# Patient Record
Sex: Female | Born: 1950
Health system: Southern US, Community
[De-identification: ages and names within clinical notes are randomized; demographics above are authoritative.]

## PROBLEM LIST (undated history)

## (undated) DIAGNOSIS — I209 Angina pectoris, unspecified: Secondary | ICD-10-CM

## (undated) DIAGNOSIS — K449 Diaphragmatic hernia without obstruction or gangrene: Secondary | ICD-10-CM

## (undated) DIAGNOSIS — Z95810 Presence of automatic (implantable) cardiac defibrillator: Secondary | ICD-10-CM

## (undated) DIAGNOSIS — G459 Transient cerebral ischemic attack, unspecified: Secondary | ICD-10-CM

## (undated) DIAGNOSIS — F419 Anxiety disorder, unspecified: Secondary | ICD-10-CM

## (undated) DIAGNOSIS — N879 Dysplasia of cervix uteri, unspecified: Secondary | ICD-10-CM

## (undated) DIAGNOSIS — Z8711 Personal history of peptic ulcer disease: Secondary | ICD-10-CM

## (undated) DIAGNOSIS — N189 Chronic kidney disease, unspecified: Secondary | ICD-10-CM

## (undated) DIAGNOSIS — E119 Type 2 diabetes mellitus without complications: Secondary | ICD-10-CM

## (undated) DIAGNOSIS — I25709 Atherosclerosis of coronary artery bypass graft(s), unspecified, with unspecified angina pectoris: Secondary | ICD-10-CM

## (undated) DIAGNOSIS — J45909 Unspecified asthma, uncomplicated: Secondary | ICD-10-CM

## (undated) DIAGNOSIS — I219 Acute myocardial infarction, unspecified: Secondary | ICD-10-CM

## (undated) DIAGNOSIS — N289 Disorder of kidney and ureter, unspecified: Secondary | ICD-10-CM

## (undated) DIAGNOSIS — I5032 Chronic diastolic (congestive) heart failure: Secondary | ICD-10-CM

## (undated) DIAGNOSIS — K219 Gastro-esophageal reflux disease without esophagitis: Secondary | ICD-10-CM

## (undated) DIAGNOSIS — I509 Heart failure, unspecified: Secondary | ICD-10-CM

## (undated) DIAGNOSIS — K759 Inflammatory liver disease, unspecified: Secondary | ICD-10-CM

## (undated) DIAGNOSIS — Z950 Presence of cardiac pacemaker: Secondary | ICD-10-CM

## (undated) DIAGNOSIS — T4145XA Adverse effect of unspecified anesthetic, initial encounter: Secondary | ICD-10-CM

## (undated) DIAGNOSIS — Z8619 Personal history of other infectious and parasitic diseases: Secondary | ICD-10-CM

## (undated) DIAGNOSIS — I10 Essential (primary) hypertension: Secondary | ICD-10-CM

## (undated) DIAGNOSIS — N183 Chronic kidney disease, stage 3 (moderate): Secondary | ICD-10-CM

## (undated) DIAGNOSIS — D219 Benign neoplasm of connective and other soft tissue, unspecified: Secondary | ICD-10-CM

## (undated) DIAGNOSIS — T8859XA Other complications of anesthesia, initial encounter: Secondary | ICD-10-CM

## (undated) DIAGNOSIS — I471 Supraventricular tachycardia: Secondary | ICD-10-CM

## (undated) DIAGNOSIS — I639 Cerebral infarction, unspecified: Secondary | ICD-10-CM

## (undated) DIAGNOSIS — D509 Iron deficiency anemia, unspecified: Secondary | ICD-10-CM

## (undated) DIAGNOSIS — G43909 Migraine, unspecified, not intractable, without status migrainosus: Secondary | ICD-10-CM

## (undated) DIAGNOSIS — E785 Hyperlipidemia, unspecified: Secondary | ICD-10-CM

## (undated) DIAGNOSIS — F32A Depression, unspecified: Secondary | ICD-10-CM

## (undated) DIAGNOSIS — F329 Major depressive disorder, single episode, unspecified: Secondary | ICD-10-CM

## (undated) DIAGNOSIS — J189 Pneumonia, unspecified organism: Secondary | ICD-10-CM

## (undated) DIAGNOSIS — I2510 Atherosclerotic heart disease of native coronary artery without angina pectoris: Secondary | ICD-10-CM

## (undated) DIAGNOSIS — G8190 Hemiplegia, unspecified affecting unspecified side: Secondary | ICD-10-CM

## (undated) DIAGNOSIS — I255 Ischemic cardiomyopathy: Secondary | ICD-10-CM

## (undated) DIAGNOSIS — Z8719 Personal history of other diseases of the digestive system: Secondary | ICD-10-CM

## (undated) DIAGNOSIS — M549 Dorsalgia, unspecified: Secondary | ICD-10-CM

## (undated) DIAGNOSIS — I341 Nonrheumatic mitral (valve) prolapse: Secondary | ICD-10-CM

## (undated) DIAGNOSIS — R0602 Shortness of breath: Secondary | ICD-10-CM

## (undated) HISTORY — PX: COLONOSCOPY: SHX174

## (undated) HISTORY — DX: Essential (primary) hypertension: I10

## (undated) HISTORY — DX: Benign neoplasm of connective and other soft tissue, unspecified: D21.9

## (undated) HISTORY — DX: Transient cerebral ischemic attack, unspecified: G45.9

## (undated) HISTORY — DX: Chronic diastolic (congestive) heart failure: I50.32

## (undated) HISTORY — DX: Major depressive disorder, single episode, unspecified: F32.9

## (undated) HISTORY — DX: Diaphragmatic hernia without obstruction or gangrene: K44.9

## (undated) HISTORY — DX: Heart failure, unspecified: I50.9

## (undated) HISTORY — DX: Supraventricular tachycardia: I47.1

## (undated) HISTORY — PX: CARDIAC CATHETERIZATION: SHX172

## (undated) HISTORY — PX: DILATION AND CURETTAGE OF UTERUS: SHX78

## (undated) HISTORY — PX: BIV ICD GENERTAOR CHANGE OUT: SHX5745

## (undated) HISTORY — PX: BREAST EXCISIONAL BIOPSY: SUR124

## (undated) HISTORY — DX: Unspecified asthma, uncomplicated: J45.909

## (undated) HISTORY — DX: Atherosclerotic heart disease of native coronary artery without angina pectoris: I25.10

## (undated) HISTORY — PX: CORONARY ANGIOPLASTY WITH STENT PLACEMENT: SHX49

## (undated) HISTORY — DX: Anxiety disorder, unspecified: F41.9

## (undated) HISTORY — DX: Depression, unspecified: F32.A

## (undated) HISTORY — DX: Hemiplegia, unspecified affecting unspecified side: G81.90

## (undated) HISTORY — PX: CARDIAC DEFIBRILLATOR PLACEMENT: SHX171

## (undated) HISTORY — DX: Chronic kidney disease, stage 3 (moderate): N18.3

## (undated) HISTORY — DX: Type 2 diabetes mellitus without complications: E11.9

## (undated) HISTORY — PX: INSERT / REPLACE / REMOVE PACEMAKER: SUR710

## (undated) HISTORY — DX: Nonrheumatic mitral (valve) prolapse: I34.1

## (undated) HISTORY — PX: CORONARY ARTERY BYPASS GRAFT: SHX141

## (undated) HISTORY — PX: BREAST SURGERY: SHX581

## (undated) HISTORY — DX: Cerebral infarction, unspecified: I63.9

## (undated) HISTORY — DX: Personal history of other infectious and parasitic diseases: Z86.19

## (undated) HISTORY — DX: Atherosclerosis of coronary artery bypass graft(s), unspecified, with unspecified angina pectoris: I25.709

## (undated) HISTORY — DX: Disorder of kidney and ureter, unspecified: N28.9

## (undated) HISTORY — DX: Hyperlipidemia, unspecified: E78.5

## (undated) HISTORY — DX: Presence of automatic (implantable) cardiac defibrillator: Z95.810

## (undated) HISTORY — DX: Ischemic cardiomyopathy: I25.5

## (undated) HISTORY — DX: Dysplasia of cervix uteri, unspecified: N87.9

---

## 1983-11-09 HISTORY — PX: ABDOMINAL HYSTERECTOMY: SHX81

## 1998-03-06 ENCOUNTER — Ambulatory Visit (HOSPITAL_COMMUNITY): Admission: RE | Admit: 1998-03-06 | Discharge: 1998-03-06 | Payer: Self-pay | Admitting: Cardiology

## 1998-03-27 ENCOUNTER — Ambulatory Visit (HOSPITAL_COMMUNITY): Admission: RE | Admit: 1998-03-27 | Discharge: 1998-03-27 | Payer: Self-pay | Admitting: Cardiology

## 1998-04-11 ENCOUNTER — Inpatient Hospital Stay (HOSPITAL_COMMUNITY): Admission: EM | Admit: 1998-04-11 | Discharge: 1998-04-13 | Payer: Self-pay | Admitting: Emergency Medicine

## 1998-06-23 ENCOUNTER — Inpatient Hospital Stay (HOSPITAL_COMMUNITY): Admission: EM | Admit: 1998-06-23 | Discharge: 1998-06-24 | Payer: Self-pay | Admitting: Emergency Medicine

## 1998-08-17 ENCOUNTER — Encounter: Payer: Self-pay | Admitting: Emergency Medicine

## 1998-08-17 ENCOUNTER — Emergency Department (HOSPITAL_COMMUNITY): Admission: EM | Admit: 1998-08-17 | Discharge: 1998-08-17 | Payer: Self-pay | Admitting: Emergency Medicine

## 1998-12-26 ENCOUNTER — Ambulatory Visit (HOSPITAL_COMMUNITY): Admission: RE | Admit: 1998-12-26 | Discharge: 1998-12-26 | Payer: Self-pay | Admitting: Cardiology

## 1998-12-26 ENCOUNTER — Encounter: Payer: Self-pay | Admitting: Cardiology

## 1999-01-03 ENCOUNTER — Inpatient Hospital Stay (HOSPITAL_COMMUNITY): Admission: EM | Admit: 1999-01-03 | Discharge: 1999-01-04 | Payer: Self-pay | Admitting: Emergency Medicine

## 1999-01-03 ENCOUNTER — Encounter: Payer: Self-pay | Admitting: Cardiovascular Disease

## 1999-11-25 ENCOUNTER — Other Ambulatory Visit: Admission: RE | Admit: 1999-11-25 | Discharge: 1999-11-25 | Payer: Self-pay | Admitting: Obstetrics and Gynecology

## 1999-11-26 ENCOUNTER — Encounter: Admission: RE | Admit: 1999-11-26 | Discharge: 1999-11-26 | Payer: Self-pay | Admitting: Obstetrics and Gynecology

## 1999-11-26 ENCOUNTER — Encounter: Payer: Self-pay | Admitting: Obstetrics and Gynecology

## 1999-12-23 ENCOUNTER — Inpatient Hospital Stay (HOSPITAL_COMMUNITY): Admission: EM | Admit: 1999-12-23 | Discharge: 1999-12-25 | Payer: Self-pay | Admitting: *Deleted

## 1999-12-23 ENCOUNTER — Encounter: Payer: Self-pay | Admitting: *Deleted

## 2000-02-02 ENCOUNTER — Encounter: Payer: Self-pay | Admitting: Emergency Medicine

## 2000-02-02 ENCOUNTER — Inpatient Hospital Stay (HOSPITAL_COMMUNITY): Admission: EM | Admit: 2000-02-02 | Discharge: 2000-02-03 | Payer: Self-pay | Admitting: Emergency Medicine

## 2000-02-05 ENCOUNTER — Ambulatory Visit (HOSPITAL_COMMUNITY): Admission: RE | Admit: 2000-02-05 | Discharge: 2000-02-06 | Payer: Self-pay | Admitting: Cardiology

## 2000-03-01 ENCOUNTER — Encounter (HOSPITAL_COMMUNITY): Admission: RE | Admit: 2000-03-01 | Discharge: 2000-05-30 | Payer: Self-pay | Admitting: Cardiology

## 2000-04-12 ENCOUNTER — Ambulatory Visit (HOSPITAL_COMMUNITY): Admission: RE | Admit: 2000-04-12 | Discharge: 2000-04-12 | Payer: Self-pay | Admitting: Gastroenterology

## 2000-04-19 ENCOUNTER — Encounter: Admission: RE | Admit: 2000-04-19 | Discharge: 2000-04-19 | Payer: Self-pay | Admitting: Gastroenterology

## 2000-04-19 ENCOUNTER — Encounter: Payer: Self-pay | Admitting: Gastroenterology

## 2000-05-06 ENCOUNTER — Encounter: Payer: Self-pay | Admitting: Family Medicine

## 2000-05-06 ENCOUNTER — Encounter: Admission: RE | Admit: 2000-05-06 | Discharge: 2000-05-06 | Payer: Self-pay | Admitting: Family Medicine

## 2000-10-20 ENCOUNTER — Inpatient Hospital Stay (HOSPITAL_COMMUNITY): Admission: EM | Admit: 2000-10-20 | Discharge: 2000-10-23 | Payer: Self-pay | Admitting: Emergency Medicine

## 2000-10-20 ENCOUNTER — Encounter: Payer: Self-pay | Admitting: *Deleted

## 2000-11-29 ENCOUNTER — Encounter: Payer: Self-pay | Admitting: Obstetrics and Gynecology

## 2000-11-29 ENCOUNTER — Encounter: Admission: RE | Admit: 2000-11-29 | Discharge: 2000-11-29 | Payer: Self-pay | Admitting: Obstetrics and Gynecology

## 2000-12-08 ENCOUNTER — Other Ambulatory Visit: Admission: RE | Admit: 2000-12-08 | Discharge: 2000-12-08 | Payer: Self-pay | Admitting: Obstetrics and Gynecology

## 2001-05-18 ENCOUNTER — Inpatient Hospital Stay (HOSPITAL_COMMUNITY): Admission: EM | Admit: 2001-05-18 | Discharge: 2001-05-22 | Payer: Self-pay | Admitting: Emergency Medicine

## 2001-05-18 ENCOUNTER — Encounter: Payer: Self-pay | Admitting: Cardiovascular Disease

## 2001-05-20 ENCOUNTER — Encounter: Payer: Self-pay | Admitting: Cardiovascular Disease

## 2001-05-21 ENCOUNTER — Encounter: Payer: Self-pay | Admitting: Cardiovascular Disease

## 2002-01-17 ENCOUNTER — Observation Stay (HOSPITAL_COMMUNITY): Admission: EM | Admit: 2002-01-17 | Discharge: 2002-01-18 | Payer: Self-pay | Admitting: Emergency Medicine

## 2002-01-17 ENCOUNTER — Encounter: Payer: Self-pay | Admitting: Emergency Medicine

## 2002-04-02 ENCOUNTER — Encounter: Payer: Self-pay | Admitting: Emergency Medicine

## 2002-04-02 ENCOUNTER — Inpatient Hospital Stay (HOSPITAL_COMMUNITY): Admission: EM | Admit: 2002-04-02 | Discharge: 2002-04-04 | Payer: Self-pay | Admitting: Emergency Medicine

## 2002-04-03 ENCOUNTER — Encounter: Payer: Self-pay | Admitting: Neurology

## 2002-07-21 ENCOUNTER — Emergency Department (HOSPITAL_COMMUNITY): Admission: EM | Admit: 2002-07-21 | Discharge: 2002-07-21 | Payer: Self-pay | Admitting: Emergency Medicine

## 2002-07-21 ENCOUNTER — Encounter: Payer: Self-pay | Admitting: Emergency Medicine

## 2002-11-30 ENCOUNTER — Encounter: Admission: RE | Admit: 2002-11-30 | Discharge: 2002-11-30 | Payer: Self-pay | Admitting: Obstetrics and Gynecology

## 2002-11-30 ENCOUNTER — Encounter: Payer: Self-pay | Admitting: Obstetrics and Gynecology

## 2002-12-10 ENCOUNTER — Other Ambulatory Visit: Admission: RE | Admit: 2002-12-10 | Discharge: 2002-12-10 | Payer: Self-pay | Admitting: Obstetrics and Gynecology

## 2003-01-30 ENCOUNTER — Inpatient Hospital Stay (HOSPITAL_COMMUNITY): Admission: EM | Admit: 2003-01-30 | Discharge: 2003-01-31 | Payer: Self-pay | Admitting: Emergency Medicine

## 2003-03-27 ENCOUNTER — Encounter: Payer: Self-pay | Admitting: Cardiology

## 2003-03-27 ENCOUNTER — Observation Stay (HOSPITAL_COMMUNITY): Admission: EM | Admit: 2003-03-27 | Discharge: 2003-03-28 | Payer: Self-pay

## 2003-12-26 ENCOUNTER — Ambulatory Visit (HOSPITAL_COMMUNITY): Admission: RE | Admit: 2003-12-26 | Discharge: 2003-12-26 | Payer: Self-pay | Admitting: Obstetrics and Gynecology

## 2003-12-30 ENCOUNTER — Other Ambulatory Visit: Admission: RE | Admit: 2003-12-30 | Discharge: 2003-12-30 | Payer: Self-pay | Admitting: Obstetrics and Gynecology

## 2004-06-04 ENCOUNTER — Emergency Department (HOSPITAL_COMMUNITY): Admission: EM | Admit: 2004-06-04 | Discharge: 2004-06-04 | Payer: Self-pay | Admitting: Emergency Medicine

## 2004-11-10 ENCOUNTER — Encounter (HOSPITAL_COMMUNITY): Admission: RE | Admit: 2004-11-10 | Discharge: 2005-02-08 | Payer: Self-pay | Admitting: Cardiology

## 2005-01-18 ENCOUNTER — Ambulatory Visit (HOSPITAL_COMMUNITY): Admission: RE | Admit: 2005-01-18 | Discharge: 2005-01-18 | Payer: Self-pay | Admitting: Gastroenterology

## 2005-02-09 ENCOUNTER — Encounter (HOSPITAL_COMMUNITY): Admission: RE | Admit: 2005-02-09 | Discharge: 2005-04-24 | Payer: Self-pay | Admitting: Cardiology

## 2005-04-20 ENCOUNTER — Inpatient Hospital Stay (HOSPITAL_COMMUNITY): Admission: EM | Admit: 2005-04-20 | Discharge: 2005-04-24 | Payer: Self-pay | Admitting: Emergency Medicine

## 2005-05-11 ENCOUNTER — Encounter (HOSPITAL_COMMUNITY): Admission: RE | Admit: 2005-05-11 | Discharge: 2005-08-09 | Payer: Self-pay | Admitting: Cardiology

## 2005-08-25 ENCOUNTER — Ambulatory Visit (HOSPITAL_COMMUNITY): Admission: RE | Admit: 2005-08-25 | Discharge: 2005-08-25 | Payer: Self-pay | Admitting: Addiction Medicine

## 2005-08-31 ENCOUNTER — Encounter: Admission: RE | Admit: 2005-08-31 | Discharge: 2005-08-31 | Payer: Self-pay | Admitting: Obstetrics and Gynecology

## 2005-09-01 ENCOUNTER — Other Ambulatory Visit: Admission: RE | Admit: 2005-09-01 | Discharge: 2005-09-01 | Payer: Self-pay | Admitting: Obstetrics and Gynecology

## 2006-02-25 ENCOUNTER — Emergency Department (HOSPITAL_COMMUNITY): Admission: EM | Admit: 2006-02-25 | Discharge: 2006-02-25 | Payer: Self-pay | Admitting: Emergency Medicine

## 2006-11-25 ENCOUNTER — Ambulatory Visit (HOSPITAL_COMMUNITY): Admission: RE | Admit: 2006-11-25 | Discharge: 2006-11-25 | Payer: Self-pay | Admitting: Obstetrics and Gynecology

## 2006-12-02 ENCOUNTER — Encounter (INDEPENDENT_AMBULATORY_CARE_PROVIDER_SITE_OTHER): Payer: Self-pay | Admitting: Specialist

## 2006-12-02 ENCOUNTER — Encounter: Admission: RE | Admit: 2006-12-02 | Discharge: 2006-12-02 | Payer: Self-pay | Admitting: Obstetrics and Gynecology

## 2006-12-13 ENCOUNTER — Encounter: Admission: RE | Admit: 2006-12-13 | Discharge: 2006-12-13 | Payer: Self-pay | Admitting: Obstetrics and Gynecology

## 2007-01-03 ENCOUNTER — Other Ambulatory Visit: Admission: RE | Admit: 2007-01-03 | Discharge: 2007-01-03 | Payer: Self-pay | Admitting: Obstetrics and Gynecology

## 2007-01-10 ENCOUNTER — Encounter: Admission: RE | Admit: 2007-01-10 | Discharge: 2007-01-10 | Payer: Self-pay | Admitting: General Surgery

## 2007-01-11 ENCOUNTER — Ambulatory Visit (HOSPITAL_BASED_OUTPATIENT_CLINIC_OR_DEPARTMENT_OTHER): Admission: RE | Admit: 2007-01-11 | Discharge: 2007-01-11 | Payer: Self-pay | Admitting: General Surgery

## 2007-01-11 ENCOUNTER — Encounter (INDEPENDENT_AMBULATORY_CARE_PROVIDER_SITE_OTHER): Payer: Self-pay | Admitting: Specialist

## 2007-01-11 ENCOUNTER — Encounter: Admission: RE | Admit: 2007-01-11 | Discharge: 2007-01-11 | Payer: Self-pay | Admitting: General Surgery

## 2007-04-11 ENCOUNTER — Emergency Department (HOSPITAL_COMMUNITY): Admission: EM | Admit: 2007-04-11 | Discharge: 2007-04-11 | Payer: Self-pay | Admitting: Emergency Medicine

## 2007-04-19 ENCOUNTER — Encounter: Admission: RE | Admit: 2007-04-19 | Discharge: 2007-04-19 | Payer: Self-pay | Admitting: General Surgery

## 2007-05-01 ENCOUNTER — Encounter (INDEPENDENT_AMBULATORY_CARE_PROVIDER_SITE_OTHER): Payer: Self-pay | Admitting: Diagnostic Radiology

## 2007-05-01 ENCOUNTER — Encounter: Admission: RE | Admit: 2007-05-01 | Discharge: 2007-05-01 | Payer: Self-pay | Admitting: General Surgery

## 2007-05-11 ENCOUNTER — Ambulatory Visit (HOSPITAL_COMMUNITY): Admission: RE | Admit: 2007-05-11 | Discharge: 2007-05-12 | Payer: Self-pay | Admitting: Cardiology

## 2007-05-18 ENCOUNTER — Emergency Department (HOSPITAL_COMMUNITY): Admission: EM | Admit: 2007-05-18 | Discharge: 2007-05-19 | Payer: Self-pay | Admitting: Emergency Medicine

## 2007-06-09 HISTORY — PX: SUPRAVENTRICULAR TACHYCARDIA ABLATION: SHX6106

## 2007-07-03 ENCOUNTER — Inpatient Hospital Stay (HOSPITAL_COMMUNITY): Admission: EM | Admit: 2007-07-03 | Discharge: 2007-07-05 | Payer: Self-pay | Admitting: Emergency Medicine

## 2007-07-04 ENCOUNTER — Encounter (INDEPENDENT_AMBULATORY_CARE_PROVIDER_SITE_OTHER): Payer: Self-pay | Admitting: *Deleted

## 2007-08-15 ENCOUNTER — Encounter (HOSPITAL_COMMUNITY): Admission: RE | Admit: 2007-08-15 | Discharge: 2007-11-07 | Payer: Self-pay | Admitting: Cardiology

## 2007-09-11 ENCOUNTER — Inpatient Hospital Stay (HOSPITAL_COMMUNITY): Admission: EM | Admit: 2007-09-11 | Discharge: 2007-09-13 | Payer: Self-pay | Admitting: Emergency Medicine

## 2007-09-15 IMAGING — CR DG CHEST 1V PORT
1 series · 1 of 1 positions shown · non-contrast
Comparison: 05/18/07.

CLINICAL DATA: Short of breath.
PORTABLE CHEST ? 1 VIEW:

[view not recorded]
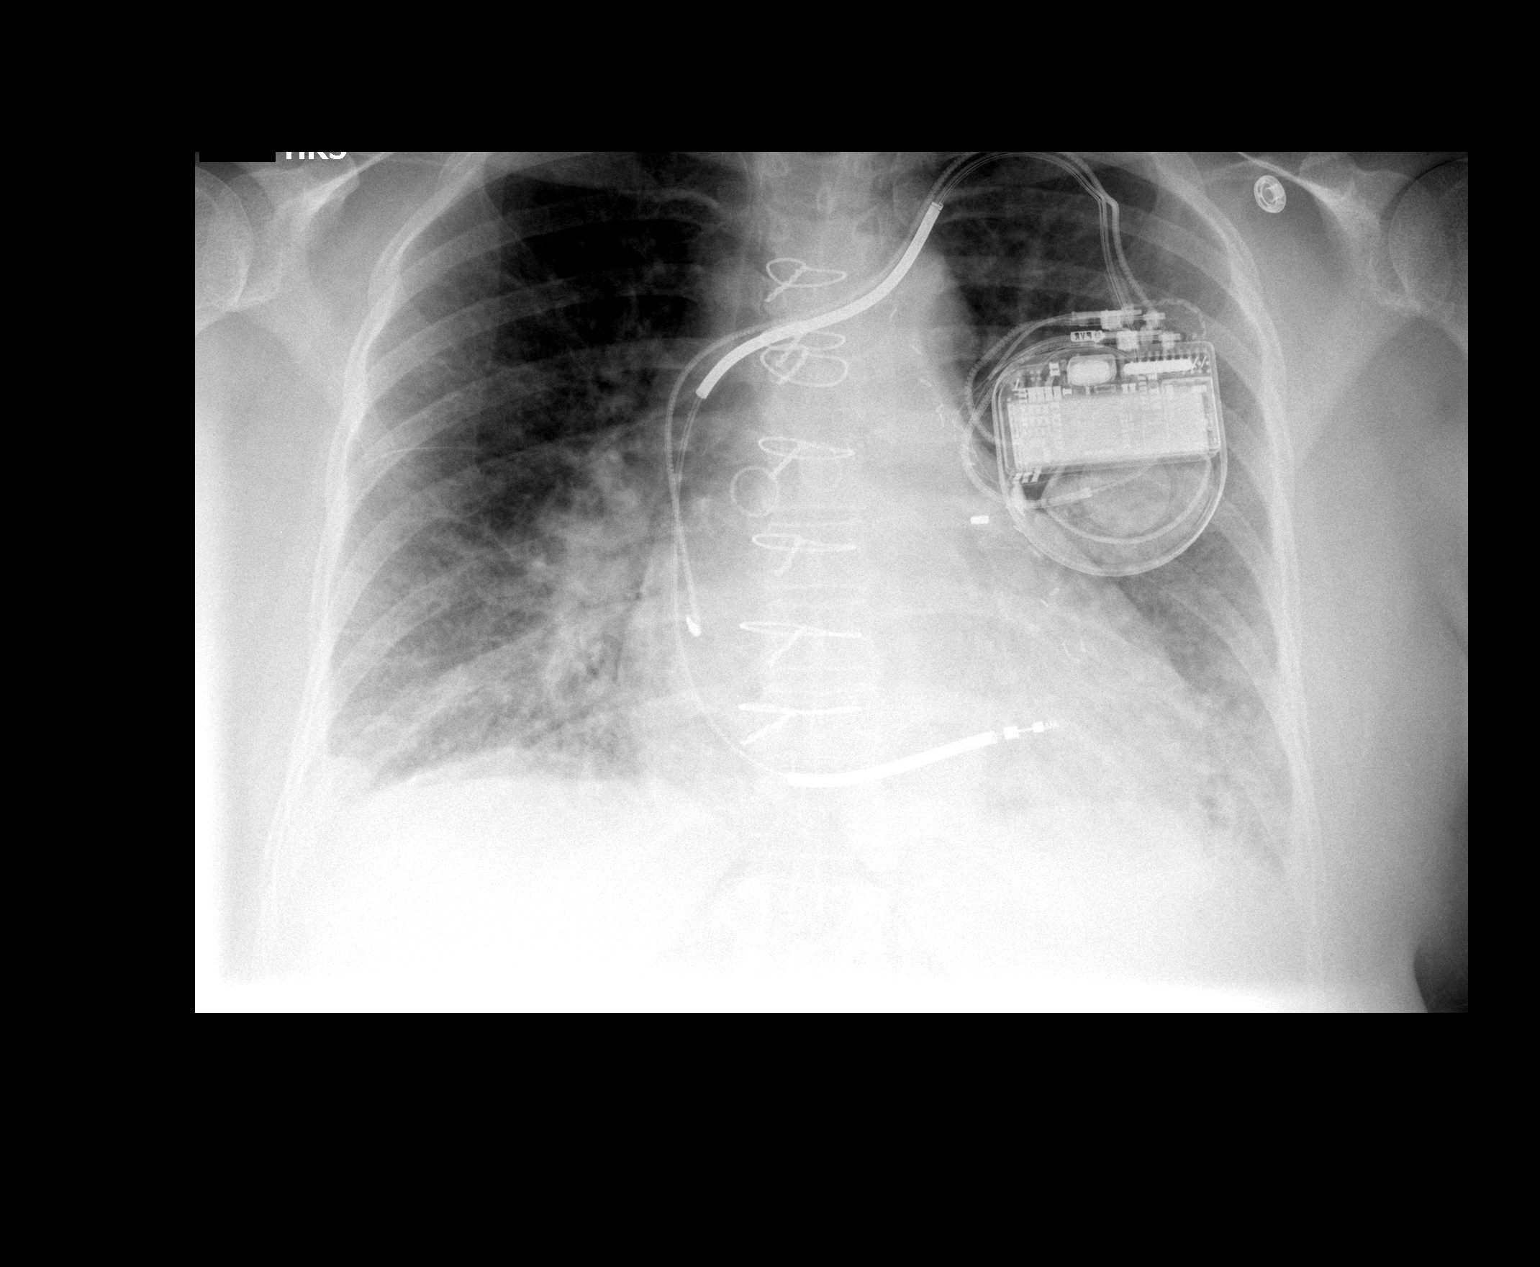

[1 of 1 positions shown; findings below may reference images not displayed]

FINDINGS: The patient has undergone placement of an AICD with two leads, since the prior study.  There is no pneumothorax.  
The heart is enlarged.  There is bilateral perihilar and lower lobe airspace disease, which may be pneumonia or edema.  The patient has also had a prior CABG.
IMPRESSION: 1. Status post placement of a dual lead AICD ? no pneumothorax. 
2. Bilateral perihilar and lower lobe airspace disease ? question pulmonary edema versus pneumonia.

## 2007-11-09 ENCOUNTER — Encounter (HOSPITAL_COMMUNITY): Admission: RE | Admit: 2007-11-09 | Discharge: 2008-02-07 | Payer: Self-pay | Admitting: Cardiology

## 2007-12-07 ENCOUNTER — Emergency Department (HOSPITAL_COMMUNITY): Admission: EM | Admit: 2007-12-07 | Discharge: 2007-12-07 | Payer: Self-pay | Admitting: Emergency Medicine

## 2008-01-25 ENCOUNTER — Encounter: Admission: RE | Admit: 2008-01-25 | Discharge: 2008-01-25 | Payer: Self-pay | Admitting: Obstetrics and Gynecology

## 2008-01-30 ENCOUNTER — Emergency Department (HOSPITAL_COMMUNITY): Admission: EM | Admit: 2008-01-30 | Discharge: 2008-01-30 | Payer: Self-pay | Admitting: Emergency Medicine

## 2008-02-05 ENCOUNTER — Inpatient Hospital Stay (HOSPITAL_COMMUNITY): Admission: EM | Admit: 2008-02-05 | Discharge: 2008-02-07 | Payer: Self-pay | Admitting: Emergency Medicine

## 2008-02-08 ENCOUNTER — Encounter (HOSPITAL_COMMUNITY): Admission: RE | Admit: 2008-02-08 | Discharge: 2008-02-08 | Payer: Self-pay | Admitting: Cardiology

## 2008-03-21 ENCOUNTER — Encounter (INDEPENDENT_AMBULATORY_CARE_PROVIDER_SITE_OTHER): Payer: Self-pay | Admitting: Pediatrics

## 2008-03-21 ENCOUNTER — Ambulatory Visit (HOSPITAL_COMMUNITY): Admission: RE | Admit: 2008-03-21 | Discharge: 2008-03-21 | Payer: Self-pay | Admitting: Cardiology

## 2008-03-22 ENCOUNTER — Ambulatory Visit (HOSPITAL_COMMUNITY): Admission: RE | Admit: 2008-03-22 | Discharge: 2008-03-22 | Payer: Self-pay | Admitting: Cardiology

## 2008-03-22 ENCOUNTER — Encounter (INDEPENDENT_AMBULATORY_CARE_PROVIDER_SITE_OTHER): Payer: Self-pay | Admitting: Cardiology

## 2008-04-09 ENCOUNTER — Ambulatory Visit (HOSPITAL_COMMUNITY): Admission: RE | Admit: 2008-04-09 | Discharge: 2008-04-09 | Payer: Self-pay | Admitting: Cardiology

## 2008-04-09 ENCOUNTER — Ambulatory Visit: Payer: Self-pay | Admitting: Vascular Surgery

## 2008-04-09 ENCOUNTER — Encounter (INDEPENDENT_AMBULATORY_CARE_PROVIDER_SITE_OTHER): Payer: Self-pay | Admitting: Cardiology

## 2008-04-17 ENCOUNTER — Inpatient Hospital Stay (HOSPITAL_COMMUNITY): Admission: RE | Admit: 2008-04-17 | Discharge: 2008-04-19 | Payer: Self-pay | Admitting: Cardiology

## 2008-04-24 ENCOUNTER — Emergency Department (HOSPITAL_COMMUNITY): Admission: EM | Admit: 2008-04-24 | Discharge: 2008-04-24 | Payer: Self-pay | Admitting: Emergency Medicine

## 2008-06-26 ENCOUNTER — Encounter (HOSPITAL_COMMUNITY): Admission: RE | Admit: 2008-06-26 | Discharge: 2008-09-24 | Payer: Self-pay | Admitting: Cardiology

## 2008-08-08 ENCOUNTER — Other Ambulatory Visit: Admission: RE | Admit: 2008-08-08 | Discharge: 2008-08-08 | Payer: Self-pay | Admitting: Obstetrics and Gynecology

## 2008-08-08 ENCOUNTER — Encounter: Payer: Self-pay | Admitting: Obstetrics and Gynecology

## 2008-08-08 ENCOUNTER — Ambulatory Visit: Payer: Self-pay | Admitting: Obstetrics and Gynecology

## 2008-10-08 ENCOUNTER — Encounter (HOSPITAL_COMMUNITY): Admission: RE | Admit: 2008-10-08 | Discharge: 2008-12-08 | Payer: Self-pay | Admitting: Cardiology

## 2008-12-09 ENCOUNTER — Encounter (HOSPITAL_COMMUNITY): Admission: RE | Admit: 2008-12-09 | Discharge: 2009-03-09 | Payer: Self-pay | Admitting: Cardiology

## 2009-02-18 ENCOUNTER — Encounter: Admission: RE | Admit: 2009-02-18 | Discharge: 2009-02-18 | Payer: Self-pay | Admitting: Obstetrics and Gynecology

## 2009-03-10 ENCOUNTER — Encounter (HOSPITAL_COMMUNITY): Admission: RE | Admit: 2009-03-10 | Discharge: 2009-06-08 | Payer: Self-pay | Admitting: Cardiology

## 2009-08-19 ENCOUNTER — Encounter: Admission: RE | Admit: 2009-08-19 | Discharge: 2009-08-19 | Payer: Self-pay | Admitting: Nephrology

## 2010-03-22 ENCOUNTER — Emergency Department (HOSPITAL_COMMUNITY): Admission: EM | Admit: 2010-03-22 | Discharge: 2010-03-23 | Payer: Self-pay | Admitting: Emergency Medicine

## 2010-06-16 ENCOUNTER — Emergency Department (HOSPITAL_BASED_OUTPATIENT_CLINIC_OR_DEPARTMENT_OTHER): Admission: EM | Admit: 2010-06-16 | Discharge: 2010-06-17 | Payer: Self-pay | Admitting: Emergency Medicine

## 2010-06-26 ENCOUNTER — Ambulatory Visit: Payer: Self-pay | Admitting: Women's Health

## 2010-08-24 ENCOUNTER — Ambulatory Visit: Payer: Self-pay | Admitting: Women's Health

## 2010-08-27 ENCOUNTER — Encounter: Admission: RE | Admit: 2010-08-27 | Discharge: 2010-08-27 | Payer: Self-pay | Admitting: Obstetrics and Gynecology

## 2010-10-21 ENCOUNTER — Inpatient Hospital Stay (HOSPITAL_COMMUNITY)
Admission: EM | Admit: 2010-10-21 | Discharge: 2010-10-23 | Payer: Self-pay | Source: Home / Self Care | Attending: Internal Medicine | Admitting: Internal Medicine

## 2010-10-21 ENCOUNTER — Encounter
Admission: RE | Admit: 2010-10-21 | Discharge: 2010-10-21 | Payer: Self-pay | Source: Home / Self Care | Attending: Neurosurgery | Admitting: Neurosurgery

## 2010-10-22 ENCOUNTER — Encounter (INDEPENDENT_AMBULATORY_CARE_PROVIDER_SITE_OTHER): Payer: Self-pay | Admitting: Internal Medicine

## 2010-11-19 ENCOUNTER — Encounter
Admission: RE | Admit: 2010-11-19 | Discharge: 2010-11-19 | Payer: Self-pay | Source: Home / Self Care | Attending: Neurosurgery | Admitting: Neurosurgery

## 2010-11-28 ENCOUNTER — Encounter: Payer: Self-pay | Admitting: Obstetrics and Gynecology

## 2010-11-28 ENCOUNTER — Encounter: Payer: Self-pay | Admitting: Neurosurgery

## 2010-12-23 ENCOUNTER — Emergency Department (HOSPITAL_COMMUNITY)
Admission: EM | Admit: 2010-12-23 | Discharge: 2010-12-23 | Disposition: A | Discharge status: Home / Self Care | Payer: 59 | Attending: Emergency Medicine | Admitting: Emergency Medicine

## 2010-12-23 DIAGNOSIS — I10 Essential (primary) hypertension: Secondary | ICD-10-CM | POA: Insufficient documentation

## 2010-12-23 DIAGNOSIS — Z95810 Presence of automatic (implantable) cardiac defibrillator: Secondary | ICD-10-CM | POA: Insufficient documentation

## 2010-12-23 DIAGNOSIS — I2510 Atherosclerotic heart disease of native coronary artery without angina pectoris: Secondary | ICD-10-CM | POA: Insufficient documentation

## 2010-12-23 DIAGNOSIS — Z8673 Personal history of transient ischemic attack (TIA), and cerebral infarction without residual deficits: Secondary | ICD-10-CM | POA: Insufficient documentation

## 2010-12-23 DIAGNOSIS — E119 Type 2 diabetes mellitus without complications: Secondary | ICD-10-CM | POA: Insufficient documentation

## 2010-12-23 DIAGNOSIS — F3289 Other specified depressive episodes: Secondary | ICD-10-CM | POA: Insufficient documentation

## 2010-12-23 DIAGNOSIS — J45909 Unspecified asthma, uncomplicated: Secondary | ICD-10-CM | POA: Insufficient documentation

## 2010-12-23 DIAGNOSIS — Z79899 Other long term (current) drug therapy: Secondary | ICD-10-CM | POA: Insufficient documentation

## 2010-12-23 DIAGNOSIS — Z7982 Long term (current) use of aspirin: Secondary | ICD-10-CM | POA: Insufficient documentation

## 2010-12-23 DIAGNOSIS — R071 Chest pain on breathing: Secondary | ICD-10-CM | POA: Insufficient documentation

## 2010-12-23 DIAGNOSIS — F329 Major depressive disorder, single episode, unspecified: Secondary | ICD-10-CM | POA: Insufficient documentation

## 2010-12-23 DIAGNOSIS — K219 Gastro-esophageal reflux disease without esophagitis: Secondary | ICD-10-CM | POA: Insufficient documentation

## 2010-12-23 DIAGNOSIS — Y849 Medical procedure, unspecified as the cause of abnormal reaction of the patient, or of later complication, without mention of misadventure at the time of the procedure: Secondary | ICD-10-CM | POA: Insufficient documentation

## 2010-12-23 DIAGNOSIS — T82897A Other specified complication of cardiac prosthetic devices, implants and grafts, initial encounter: Secondary | ICD-10-CM | POA: Insufficient documentation

## 2010-12-23 DIAGNOSIS — I509 Heart failure, unspecified: Secondary | ICD-10-CM | POA: Insufficient documentation

## 2010-12-23 LAB — BASIC METABOLIC PANEL
BUN: 16 mg/dL (ref 6–23)
Calcium: 9.4 mg/dL (ref 8.4–10.5)
Creatinine, Ser: 1.35 mg/dL — ABNORMAL HIGH (ref 0.4–1.2)
GFR calc non Af Amer: 40 mL/min — ABNORMAL LOW (ref >60)
Glucose, Bld: 137 mg/dL — ABNORMAL HIGH (ref 70–99)
Sodium: 140 mEq/L (ref 135–145)

## 2010-12-23 LAB — CBC
HCT: 33.3 % — ABNORMAL LOW (ref 36.0–46.0)
MCH: 28.8 pg (ref 26.0–34.0)
MCHC: 33.9 g/dL (ref 30.0–36.0)
MCV: 84.9 fL (ref 78.0–100.0)
RDW: 13.1 % (ref 11.5–15.5)

## 2010-12-23 LAB — POCT CARDIAC MARKERS
CKMB, poc: <1 ng/mL — ABNORMAL LOW (ref 1.0–8.0)
Myoglobin, poc: 97.3 ng/mL (ref 12–200)

## 2010-12-23 LAB — DIFFERENTIAL
Basophils Absolute: 0 10*3/uL (ref 0.0–0.1)
Eosinophils Relative: 2 % (ref 0–5)
Lymphocytes Relative: 46 % (ref 12–46)
Monocytes Absolute: 0.5 10*3/uL (ref 0.1–1.0)

## 2011-01-18 LAB — LIPID PANEL
Cholesterol: 157 mg/dL (ref 0–200)
HDL: 43 mg/dL (ref >39)
LDL Cholesterol: 62 mg/dL (ref 0–99)
Total CHOL/HDL Ratio: 3.7 RATIO

## 2011-01-18 LAB — GLUCOSE, CAPILLARY
Glucose-Capillary: 125 mg/dL — ABNORMAL HIGH (ref 70–99)
Glucose-Capillary: 146 mg/dL — ABNORMAL HIGH (ref 70–99)
Glucose-Capillary: 155 mg/dL — ABNORMAL HIGH (ref 70–99)
Glucose-Capillary: 156 mg/dL — ABNORMAL HIGH (ref 70–99)

## 2011-01-18 LAB — COMPREHENSIVE METABOLIC PANEL
AST: 29 U/L (ref 0–37)
Albumin: 3.7 g/dL (ref 3.5–5.2)
BUN: 9 mg/dL (ref 6–23)
CO2: 32 mEq/L (ref 19–32)
Calcium: 9.3 mg/dL (ref 8.4–10.5)
Calcium: 9.4 mg/dL (ref 8.4–10.5)
Chloride: 102 mEq/L (ref 96–112)
Creatinine, Ser: 1.16 mg/dL (ref 0.4–1.2)
Creatinine, Ser: 1.23 mg/dL — ABNORMAL HIGH (ref 0.4–1.2)
GFR calc Af Amer: 58 mL/min — ABNORMAL LOW (ref >60)
GFR calc non Af Amer: 45 mL/min — ABNORMAL LOW (ref >60)
Glucose, Bld: 130 mg/dL — ABNORMAL HIGH (ref 70–99)
Total Bilirubin: 0.5 mg/dL (ref 0.3–1.2)
Total Protein: 7 g/dL (ref 6.0–8.3)

## 2011-01-18 LAB — CBC
HCT: 31.9 % — ABNORMAL LOW (ref 36.0–46.0)
Hemoglobin: 10.6 g/dL — ABNORMAL LOW (ref 12.0–15.0)
MCH: 28.8 pg (ref 26.0–34.0)
MCH: 28.9 pg (ref 26.0–34.0)
MCHC: 33.2 g/dL (ref 30.0–36.0)
MCV: 86.3 fL (ref 78.0–100.0)
MCV: 86.9 fL (ref 78.0–100.0)
Platelets: 212 10*3/uL (ref 150–400)
RBC: 3.65 MIL/uL — ABNORMAL LOW (ref 3.87–5.11)
RDW: 13 % (ref 11.5–15.5)

## 2011-01-18 LAB — PROTIME-INR: INR: 0.99 (ref 0.00–1.49)

## 2011-01-18 LAB — DIFFERENTIAL
Basophils Absolute: 0 10*3/uL (ref 0.0–0.1)
Eosinophils Absolute: 0 10*3/uL (ref 0.0–0.7)
Lymphocytes Relative: 42 % (ref 12–46)
Lymphs Abs: 2.9 10*3/uL (ref 0.7–4.0)
Neutrophils Relative %: 49 % (ref 43–77)

## 2011-01-19 LAB — URINALYSIS, ROUTINE W REFLEX MICROSCOPIC
Bilirubin Urine: NEGATIVE
Glucose, UA: NEGATIVE mg/dL
Ketones, ur: NEGATIVE mg/dL
pH: 7 (ref 5.0–8.0)

## 2011-01-19 LAB — RAPID URINE DRUG SCREEN, HOSP PERFORMED
Barbiturates: NOT DETECTED
Cocaine: NOT DETECTED
Opiates: NOT DETECTED

## 2011-01-19 LAB — CBC
HCT: 33.2 % — ABNORMAL LOW (ref 36.0–46.0)
Hemoglobin: 10.9 g/dL — ABNORMAL LOW (ref 12.0–15.0)
Hemoglobin: 11.1 g/dL — ABNORMAL LOW (ref 12.0–15.0)
MCH: 30.1 pg (ref 26.0–34.0)
MCV: 87.1 fL (ref 78.0–100.0)
RBC: 3.69 MIL/uL — ABNORMAL LOW (ref 3.87–5.11)
WBC: 7.7 10*3/uL (ref 4.0–10.5)

## 2011-01-19 LAB — COMPREHENSIVE METABOLIC PANEL
ALT: 26 U/L (ref 0–35)
AST: 30 U/L (ref 0–37)
Albumin: 4 g/dL (ref 3.5–5.2)
Alkaline Phosphatase: 76 U/L (ref 39–117)
Chloride: 101 mEq/L (ref 96–112)
Potassium: 4.7 mEq/L (ref 3.5–5.1)
Sodium: 142 mEq/L (ref 135–145)
Total Protein: 7.4 g/dL (ref 6.0–8.3)

## 2011-01-19 LAB — PROTIME-INR: INR: 0.94 (ref 0.00–1.49)

## 2011-01-19 LAB — BASIC METABOLIC PANEL
CO2: 34 mEq/L — ABNORMAL HIGH (ref 19–32)
Calcium: 9.5 mg/dL (ref 8.4–10.5)
Chloride: 98 mEq/L (ref 96–112)
GFR calc Af Amer: 57 mL/min — ABNORMAL LOW (ref >60)
Potassium: 4 mEq/L (ref 3.5–5.1)
Sodium: 139 mEq/L (ref 135–145)

## 2011-01-19 LAB — CK TOTAL AND CKMB (NOT AT ARMC)
CK, MB: 0.9 ng/mL (ref 0.3–4.0)
Total CK: 55 U/L (ref 7–177)

## 2011-01-19 LAB — PHOSPHORUS: Phosphorus: 3.1 mg/dL (ref 2.3–4.6)

## 2011-01-19 LAB — DIFFERENTIAL
Basophils Relative: 0 % (ref 0–1)
Eosinophils Absolute: 0 10*3/uL (ref 0.0–0.7)
Eosinophils Relative: 1 % (ref 0–5)
Monocytes Absolute: 0.7 10*3/uL (ref 0.1–1.0)
Monocytes Relative: 9 % (ref 3–12)

## 2011-01-19 LAB — GLUCOSE, CAPILLARY

## 2011-01-19 LAB — CARDIAC PANEL(CRET KIN+CKTOT+MB+TROPI)
CK, MB: 0.7 ng/mL (ref 0.3–4.0)
Total CK: 55 U/L (ref 7–177)

## 2011-01-19 LAB — HEMOGLOBIN A1C: Hgb A1c MFr Bld: 6.8 % — ABNORMAL HIGH (ref <5.7)

## 2011-01-19 LAB — BRAIN NATRIURETIC PEPTIDE: Pro B Natriuretic peptide (BNP): 37 pg/mL (ref 0.0–100.0)

## 2011-01-22 ENCOUNTER — Encounter (INDEPENDENT_AMBULATORY_CARE_PROVIDER_SITE_OTHER): Payer: Self-pay | Admitting: *Deleted

## 2011-01-25 LAB — POCT I-STAT, CHEM 8
BUN: 14 mg/dL (ref 6–23)
Calcium, Ion: 1.07 mmol/L — ABNORMAL LOW (ref 1.12–1.32)
Creatinine, Ser: 1.4 mg/dL — ABNORMAL HIGH (ref 0.4–1.2)
TCO2: 29 mmol/L (ref 0–100)

## 2011-01-25 LAB — CBC
Hemoglobin: 11.1 g/dL — ABNORMAL LOW (ref 12.0–15.0)
RBC: 3.46 MIL/uL — ABNORMAL LOW (ref 3.87–5.11)
RDW: 13.5 % (ref 11.5–15.5)

## 2011-01-25 LAB — URINE MICROSCOPIC-ADD ON

## 2011-01-25 LAB — URINALYSIS, ROUTINE W REFLEX MICROSCOPIC
Glucose, UA: >1000 mg/dL — AB
Hgb urine dipstick: NEGATIVE
Ketones, ur: 15 mg/dL — AB
Protein, ur: NEGATIVE mg/dL

## 2011-01-25 LAB — DIFFERENTIAL
Basophils Absolute: 0 10*3/uL (ref 0.0–0.1)
Lymphocytes Relative: 39 % (ref 12–46)
Monocytes Absolute: 0.6 10*3/uL (ref 0.1–1.0)
Neutro Abs: 2.4 10*3/uL (ref 1.7–7.7)
Neutrophils Relative %: 46 % (ref 43–77)

## 2011-01-25 LAB — POCT CARDIAC MARKERS
CKMB, poc: <1 ng/mL — ABNORMAL LOW (ref 1.0–8.0)
Myoglobin, poc: 88.7 ng/mL (ref 12–200)
Troponin i, poc: <0.05 ng/mL (ref 0.00–0.09)

## 2011-01-26 NOTE — Letter (Signed)
Summary: Appointment - Reminder [name], [place]  [address] [place]   [place], [place] [address]   Phone: [phone]  Fax: [phone]     January 22, 2011 MRN: [id number]   [name] [place], [place]  [address]   Dear [name],  [facility] records indicate that it is time to schedule a follow-up appointment.  Dr.Taylor recommended that you follow up with [place] in May. It is very important that we reach you to schedule this appointment. We look forward to participating in your health care needs. Please contact us at the number listed above at your earliest convenience to schedule your appointment.  If you are unable to make an appointment at this time, give [place] a call so we can update our records.     Sincerely,   Public relations account executive

## 2011-02-18 LAB — GLUCOSE, CAPILLARY: Glucose-Capillary: 112 mg/dL — ABNORMAL HIGH (ref 70–99)

## 2011-03-23 NOTE — Op Note (Signed)
NAME:  [name], [name] NO.:  [id number]   MEDICAL RECORD NO.:  [id number]          PATIENT TYPE:  INP   LOCATION:  [id number]                         FACILITY:  [facility]   PHYSICIAN:  Barnett Abu, M.D.  DATE OF BIRTH:  1951/09/28   DATE OF PROCEDURE:  04/18/2008  DATE OF DISCHARGE:                               OPERATIVE REPORT   PROCEDURE PERFORMED:  1. Induction of ventricular fibrillation.  2. Defibrillation threshold testing.   INDICATIONS:  [name] is a 60 year old woman with severe  ischemic cardiomyopathy who returns to the catheterization laboratory  for defibrillation threshold testing of her ICD/BiV device placed  yesterday.   PROCEDURE NOTE:  While monitoring heart rate, blood pressure, O2  saturation, and ECG and following induction of moderate sedation using  Versed 4 mg, fentanyl 50 mcg, Dilaudid 1 mg intravenously, the patient  had ventricular fibrillation induced by alternating current at 50 Hz.  The device detected the rhythm and charged with a 20-joule shock  delivered after 8 seconds of total duration of VF.  There was prompt  return of sinus rhythm.  The shock impedance was 29 ohms.  There were no  dropouts at 1.2 mV sensitivity.   The patient is slowly awakening from her sedation.  Vital signs remained  stable throughout.  No use of external defibrillation was necessary.   FINAL IMPRESSION:  Successful defibrillation threshold testing,  conversion of ventricular fibrillation to sinus rhythm at 20 joules.  Cycle length of fibrillation was 170 milliseconds.      Barnett Abu, M.D.  Electronically Signed     JHE/MEDQ  D:  04/18/2008  T:  04/19/2008  Job:  [id number]   cc:   Bryson Dames, M.D.

## 2011-03-23 NOTE — Cardiovascular Report (Signed)
NAME:  [name], [name] NO.:  [id number]   MEDICAL RECORD NO.:  [id number]          PATIENT TYPE:  OIB   LOCATION:  2899                         FACILITY:  [facility]   PHYSICIAN:  Bryson Dames, M.D.DATE OF BIRTH:  Feb 04, 1951   DATE OF PROCEDURE:  05/11/2007  DATE OF DISCHARGE:                            CARDIAC CATHETERIZATION   PROCEDURES PERFORMED:  1. Selective coronary angiography of the native coronary circulation      by Judkins' technique.  2. Retrograde left heart catheterization.  3. Left ventricular angiography.  4. Selective visualization of the left internal mammary artery graft      to LAD.  5. Selective visualization of saphenous vein graft to diagonal and      saphenous vein graft to the distal RCA.   INTERVENTIONS:  None.   MEDICATIONS GIVEN:  Versed and fentanyl, two rounds of that for  sedation.   COMPLICATIONS:  None.   CLINICAL NOTE:  Notation:  The patient has a latex allergy, and  extensive latex precautions were observed.   PATIENT PROFILE:  [name] is a 60 year old married African-  American female who has been under my care for approximately 15 years.  She has undergone coronary bypass grafting in May of 1998.  I have been  seeing her since approximately 1990.  She has had ejection fraction  determinations of 35% on several occasions and has an ischemic  cardiomyopathy with few symptoms from the standpoint of heart failure.  She observes.  She takes her medications very conscientiously.   From a clinical standpoint, she is recently had multiple visits to our  emergency room for episodes of supraventricular tachycardia.  This has  occurred at least twice.  Adenosine has worked.  In view of her low  ejection fraction and the anticipated need for and consideration of an  ICD, I would like to plan on getting an electrophysiologic consultation  on her, so I will go ahead today with cardiac catheterization to  evaluate LV  systolic function, graft patency and ensure that she has not  developed interval disease in her circumflex, which so far has been free  of any high-grade stenoses.  Today's procedure was performed electively  on an outpatient basis, without any complications.   RESULTS:  Pressures:  Left ventricular pressure was 123XX123, end-diastolic  pressure 15.  Central aortic pressure was 135/80, with a mean of 105.   ANGIOGRAPHIC RESULTS:  The patient's left main coronary artery was  normal.  LAD coursed the cardiac apex.  There was a 75% stenosis in the  mid-LAD.  There was a patent LIMA graft inserted into the mid-LAD, well  beyond the diagonal takeoff, and the LIMA graft itself appears normal,  and the distal LAD appears normal.   Diagonal branch of LAD contains a proximal 90% stenosis.  A saphenous  vein graft inserts into that diagonal and appears normal in appearance.   Left circumflex coronary artery contains three obtuse marginal branches,  all of which appear to be in fairly good shape.  Distal circumflex  appears normal.  The circumflex is a non-dominant vessel.   Right coronary  artery contains an 80% stenosis proximally, a 100%  occlusion of the mid RCA.  There, since 2006, has been complete  occlusion of the vessel.  The saphenous vein graft to RCA inserts into  the posterior descending branch.  The PDA fills both antegrade and  retrograde, and we see retrograde filling of a diseased posterolateral  branch which contains a 95% stenosis, mid-way down the PLA.  The vein  graft is widely patent.   LV angiography shows an ejection fraction estimated at between 25% and  35%.  There is mild mitral regurgitation.  The contractile pattern is  global hypokinesis.   FINAL IMPRESSIONS:  1. Multi-vessel coronary artery disease.      a.     A 75% mid-LAD stenosis.      b.     A 90% proximal diagonal stenosis.      c.     An 80% proximal RCA stenosis and 100% occlusion of the mid       RCA.   2. Status post coronary artery bypass grafting.      a.     Patent LIMA to LAD.      b.     Patent saphenous vein graft to diagonal.      c.     Patent vein graft to PDA and retrograde filling of       posterolateral branch.  3. Ischemic cardiomyopathy, ejection fraction 25% to 35%.  4. Recent episodes of what appear to be paroxysmal supraventricular      tachycardia.   PLAN:  The patient will need an electrophysiology consultation.  We will  get that arranged.   MEDICATIONS:  We will continue her current medications which include  metformin, Diovan, Toprol 75 mg twice a day, aspirin and Plavix, Zocor,  Lasix, Altace, Zetia and Effexor and Xanax.           ______________________________  Bryson Dames, M.D.     WHG/MEDQ  D:  05/11/2007  T:  05/11/2007  Job:  [id number]   cc:   Ardeen Jourdain, M.D.  Bryson Dames, M.D.  [facility]

## 2011-03-23 NOTE — Discharge Summary (Signed)
NAME:  [name], KEENEN NO.:  [id number]   MEDICAL RECORD NO.:  [id number]          PATIENT TYPE:  OIB   LOCATION:  [id number]                         FACILITY:  [facility]   PHYSICIAN:  Bryson Dames, M.D.DATE OF BIRTH:  1951/09/21   DATE OF ADMISSION:  05/11/2007  DATE OF DISCHARGE:  05/12/2007                               DISCHARGE SUMMARY   DISCHARGE DIAGNOSES:  1. Chest pain.  2. Coronary artery disease with a history of bypass grafting in May of      1988.      a.     Cardiac catheterization, May 11, 2007, with patent graft.  3. Ischemic cardiomyopathy.  4. Paroxysmal supraventricular tachycardia recurrence.  5. Diabetes mellitus type 2.  6. Hypertension.  7. Anxiety, depression.   DISCHARGE CONDITION:  Stable.   PROCEDURE:  May 11, 2007, combined left heart catheterization with graft  visualization by Dr. Bryson Dames.   DISCHARGE MEDICATIONS:  At the time of the dictation, will be the same  as her outpatient medications.  1. Her metformin 1000 mg b.i.d. will be on hold until Saturday the 5th      of July and then she can resume.  2. Diovan 320 daily.  3. Toprol 75 mg twice a day.  4. Aspirin 325 daily.  5. Plavix 75 daily.  6. Zocor 10 mg daily.  7. Lasix 20 mg daily.  8. Altace 10 mg daily.  9. Zetia 10 mg daily.  10.Effexor XR 150 mg b.i.d.  11.Zyrtec 10 mg daily.  12.Etodolac 400 mg daily.  13.Multivitamin daily.  14.Stool softener daily.  15.Prilosec 20 mg daily.  16.Detrol LA 4 mg daily.  17.Claritin 10 mg daily.  18.Alocril 5% 2 drop both eyes daily.  19.Flonase 0.05% inhaled p.r.n.  20.Proventil inhaler 2 puffs p.r.n.   DISCHARGE INSTRUCTIONS:  1. Low-fat, diabetic diet.  2. No driving for 2 days.  No lifting for 2 days.  3. Increase activity slowly.  4. May shower and bath.  5. May walk up steps.  6. Wash right groin cath site with soap and water.  Call if any      bleeding, swelling or drainage.  7. Repeat, no metformin  until Saturday, May 13, 2007.  8. Follow up with Dr. Melvern Banker.  The office will call with a date and      time.   HISTORY OF PRESENT ILLNESS:  A 60 year old African American female with  a history of coronary artery disease with bypass grafting in 1998, EF of  35% with symptoms of mild exertional dyspnea.  Other history does  include PFVT.  She has had 3 episodes of PFVT recently and was recently  seen in the ER on April 11, 2007 with a heart rate of 150, given adenosine  conversion to sinus rhythm.  The patient also had hives and received a  Medrol dose pack.  She had had 2 episodes of paroxysmal SVT, received 4  doses of adenosine in the emergency room.  She saw Dr. Melvern Banker back on  May 05, 2007.  It was felt, at that time, he would have an  EP consult,  but cardiac catheterization to ensure that there was no ischemia.  He  also will have EP decide if she needs ICD and/or BiV ICD.   She was brought in for cardiac catheterization, which she had today,  May 11, 2007.  Post procedure though, she developed headache and then  chest discomfort.  She was given Darvocet and then seemed very  lethargic, probably a strong anxiety component is added to this, which  it has in the past.  It was felt she would be best if she was kept over  night at [facility] for monitoring purposes, which we have done.  She should  be discharged on May 12, 2007.  Lab work will be done that morning and  if she has problems that keep her longer, we will do an addendum to the  discharge.   FAMILY HISTORY:  Please see H&P for all of those.   SOCIAL HISTORY:  Please see H&P for all of those.   REVIEW OF SYSTEMS:  Please see H&P for all of those.      [name]. Dorene Ar, N.P.    ______________________________  Bryson Dames, M.D.    LRI/MEDQ  D:  05/11/2007  T:  05/12/2007  Job:  [id number]   cc:   Ardeen Jourdain, M.D.

## 2011-03-23 NOTE — Discharge Summary (Signed)
NAME:  [name], [name] NO.:  [id number]   MEDICAL RECORD NO.:  [id number]          PATIENT TYPE:  REC   LOCATION:  REHS                         FACILITY:  [facility]   PHYSICIAN:  Eden Lathe. Einar Gip, MD       DATE OF BIRTH:  October 25, 1951   DATE OF ADMISSION:  02/08/2008  DATE OF DISCHARGE:                               DISCHARGE SUMMARY   HISTORY OF PRESENT ILLNESS:  [name] is a 60 year old African-  American female patient of Dr. Myrtice Lauth with a history of ischemic  cardiomyopathy, EF of 25-35%.  She does have a Medtronic ICD placed at  [facility] on June 27, 2007.  She has coronary artery disease with a  coronary bypass grafting and her last cath was on May 11, 2007.  She had  patent grafts at that time.  She has had an SVT ablation, NIDDM, and  ischemic cardiomyopathy with a history of CHF.  She came to the  emergency room with increased shortness of breath and PND.  She was  admitted.  She was put on IV diuretics as an outpatient.  She had been  placed on spironolactone.  She was doing well with that, except the pill  made her nauseated that she discontinued it.  On admission, she was put  on Inspra.  By the following day, she was feeling much better.  On February 07, 2008, she was considered stable for discharge to home.  Her BNP at  that time was 265.  She did tolerate the Inspra.   LABS:  Hemoglobin 11, hematocrit 33, WBCs 8.9, platelets 213, sodium  139, potassium 4.4, BUN 11, creatinine 0.98, glucose 102, hemoglobin A1c  6.4.  CK-MB x3 and troponins x3 negative.  BNP on February 05, 2008, was  677.  On the day of discharge, it was 265.  Urine was negative for any  infections.  There is no chest x-ray reported in the chart at the time  of this dictation.   DISCHARGE MEDICATIONS:  1. Diovan 320 mg every day.  2. Metoprolol 100 mg twice a day.  3. Plavix 75 mg a day.  4. Docusate sodium 200 mg a day.  5. Simvastatin 10 mg a day.  6. Furosemide 40 mg a day.  7.  Altace 10 mg twice per day.  8. Omeprazole 20 mg twice a day.  9. Rozerem 8 mg daily at bedtime.  10.Effexor XR 150 mg a day.  11.Alprazolam 1 mg 3 tablets a day.  12.Zetia 10 mg a day.  13.Flonase nasal as needed.  14.Proventil 2 puffs as needed.  15.Darvocet-N 100 as needed.  16.Multivitamin daily.  17.Aspirin 325 mg a day.  18.Eye drops daily.  19.Potassium chloride 20 mEq a day.  20.Zyrtec 10 mg a day.  21.B12 4 mg a day.  22.Iron 325 mg a day.  23.Inspra 25 mg a day.  24.Digoxin 0.25 mg a day.  25.Metformin 1000 mg b.i.d.   DISCHARGE DIAGNOSES:  1. Acute on chronic systolic congestive heart failure.  2. Ischemic cardiomyopathy, EF of 25%-35% by cath, 25% by echo  07/03/2004.  3. Atherosclerotic cardiovascular disease, history of an MI, coronary      bypass grafting with last cath May 11, 2007.  4. Supraventricular tachycardia ablation and Medtronic ICD placed at      [facility] June 27, 2007.  5. Non-insulin-dependent diabetes mellitus.  6. Hypertension.  7. Mitral valve prolapse.  8. Asthma.  9. Hiatal hernia and gastroesophageal reflux disease.  10.Stress, depression, and anxiety.  11.Osteoarthritis.  12.Left breast lumpectomy.  13.Last Myoview January 03, 2008, she had no ischemia, EF was 30%.  14.On January 03, 2008, normal renal duplex.   She will follow up with Dr. Melvern Banker.  She will call our office for an  appointment.  She was reminded about a 2000 mg or less sodium diet and  less than 1500 mL of fluid per day.       Cyndia Bent, N.P.      Eden Lathe. Einar Gip, MD  Electronically Signed    BB/MEDQ  D:  03/15/2008  T:  03/16/2008  Job:  [id number]

## 2011-03-23 NOTE — Op Note (Signed)
NAME:  [name], [name] NO.:  [id number]   MEDICAL RECORD NO.:  [id number]          PATIENT TYPE:  INP   LOCATION:  4714                         FACILITY:  [facility]   PHYSICIAN:  Barnett Abu, M.D.  DATE OF BIRTH:  October 27, 1951   DATE OF PROCEDURE:  DATE OF DISCHARGE:                               OPERATIVE REPORT   PROCEDURES PERFORMED:  1. Left subclavian venogram.  2. Left subclavian venous balloon angioplasty.  3. Excision old pace shock generator.  4. Insertion new left ventricular pacing lead.  5. Coronary sinus venogram.  6. Insert new ICD/BiV generator.   INDICATION:  [name] is a pleasant 60 year old woman with a  long history of coronary artery disease and a severe ischemic  cardiomyopathy with an LVEF of less than 20% and class 4 symptoms of  heart failure.  She is approximately 18 months S/P insertion of an ICD  for prophylactic purposes.  She is brought to the catheterization  laboratory at this time to upgrade the device to ICD with biventricular  pacing.   PROCEDURAL NOTE:  The patient is brought to cardiac catheterization  laboratory in the fasting state.  The left prepectoral region was  prepped and draped in usual sterile fashion.  A left subclavian venogram  was then performed with a peripheral injection of 15 mL of Omnipaque.  A  digital cineangiogram was obtained and road mapped to guide future  left subclavian puncture.  The venogram did demonstrate the vein to have  a significant 80% stenosis in the distal portion just as it entered the  innominate vein surrounding the previously placed leads.  There was some  collateral filling seen, but the flow into the innominate was present  before the flow into the collaterals.  The left subclavian vein was then  punctured with some difficulty using a micropuncture needle.  The  micropuncture wire was passed as far as the stenosis and eventually the  micropuncture catheter and dilator  advanced into the proximal portion of  the vein.  This was all undertaken percutaneously following the  subcutaneous infiltration with 1% lidocaine and epinephrine.  Initially,  I opened the previous wound about 1 cm, but this did not allow adequate  cannulation/puncture the vein.  I finally had to move more lateral and  slightly superior to obtain access.  Through the micropuncture sheath, I  was unable to pass any 0.035 wire across the stenosis.  I was successful  in crossing the lesion with initially a Prowater 0.014 inch wire and  then a Mailman 0.014 wire as well.  The initial attempts to pass a 4-  [place] sheath over these guidewires were unsuccessful.   I then consulted Dr. Sherren Mocha for assistance with obtaining  access.  He recommended the initial use of the [employer] Quick-Cross  catheter.  This did penetrate the stenosis, but was not successful in  crossing.  We finally used a Cordis SAVVY peripheral angioplasty balloon  4.0 x 16 mm, which did easily cross the lesion.  This was inflated again  under his direct supervision to 8 atmospheres for approximately 1  minute.  The angioplasty balloon was removed, but I again was  unsuccessful at attempting to pass a sheath adequate to allow placement  of a 0.035 wire; however, I did attempt to cross the lesion after  removing the angioplasty 0.014 wire with a Terumo Glidewire and was  successful.  I attempted then to pass the Cordis PowerFlex 5 mm x 4.0  cm.  This was not successful in crossing the lesion.  An attempt to pull  this device out of the vein, I lost access completely as the Terumo  Glidewire also followed the uninflated balloon out.  I then had to  reestablish access again with the cook micropuncture set.  At this point  for some unknown reason, the access wire for the micropuncture set  easily crossed the lesion in the previous angioplasty site.  I followed  this with placement of a 0.035 inch guidewire and then  upside from the  previous small micropuncture sheath to 6-French, then an 8-French, then  finally a 9-mm [place] long St. Jude CRM safe sheath.  The latter  required placement of an Amplatz extra support guidewire.   I then proceeded with cannulation of the coronary sinus using a  Medtronic CRM attained, MB2 guide catheter and a Wholey guidewire.  Initially, manipulation of the guiding catheter was quite difficult.  Dr. Jolyn Nap assisted and recommended removal of the safe sheath,  which then allowed better torquing of the MB-2 guiding catheter and  subsequent successful cannulation.   Coronary sinus venograms were then performed with the peripheral  injection of Omnipaque contrast in LAO and RAO projections.  Road maps  were formed and it was noted she had 2 adequate sized left lateral veins  for placement of the lead.  The vein was then cannulated using a 0.014  Prowater guidewire.  Over this, the Medtronic over the wire attained,  left ventricular lead model #4194 was passed into the vein with good  distal placement and excellent pacing parameters as will be noted below.   At this point, the attention was redirected to the left subclavian  region where the previous ICD site was incised and the incision carried  over to the subclavian access puncture site.  Using low power  electrocautery and blunt dissection, the ICD pacemaker pocket was  exposed and then the fibrous capsule incised.  The device was delivered  and detached from the previously placed leads.  Each lead was then  tested for adequate pacing parameters and this was noted below.  The  guiding catheter was then removed from the left ventricular lead by the  slit technique.  The left ventricular lead was sutured into place using  3 separate 0-silk ligatures.  The pocket was then modified for placement  of the ICD BiV generator.  The leads were then attached to the pace  shock generator carefully identifying each by its  serial number and  placing each into the appropriate receptacle under the supervision of  the Medtronic representative.  Each lead was tightened into place and  tested for security.  The left ventricular leads were one on beneath the  pace shock generator and the generator was placed in the pocket after  copiously irrigating with 1% kanamycin solution.  An 0-silk anchoring  suture was applied.   The wound was then closed using 2-0 Vicryl in a running fashion with the  subcutaneous tissue.  Two layers applied.  The skin was approximated  using 4-0 Vicryl in a running subcuticular fashion.  Steri-Strips and a  sterile dressing were applied.  The patient is transported to the  recovery area in stable condition.   EQUIPMENT DATA:  The pace shock generator is a Medtronic Darby model  123456 [place] serial number #[id number].  The left ventricular lead is a  Medtronic model N1500723, serial number [id number].   PACING DATA:  The ventricular lead detected a 10.6 millivolt R-wave.  The pacing threshold was 0.8 volts at 0.5 milliseconds pulse width.  The  impedance was 522 ohms resulting in a current at capture threshold of  1.7 MA.  The atrial lead detected a 3.5 mV P-wave.  The pacing threshold  was 0.6 volts at 0.5 milliseconds pulse width.  The impedance was 495  ohms resulting in a current capture threshold of 1.2 MA.  The left  ventricular lead detected a 14.0 mV R-wave.  The pacing threshold was  0.8 volts at 0.5 milliseconds pulse width.  The impedance was 1231 ohms  resulting in a current at capture threshold of 0.8 MA.   Shocking coil impedance was 48 ohms proximally and distally was 37 ohms.   FINAL IMPRESSION:  1. Successful upgrade implantable cardioverter-defibrillator to      implantable cardioverter-defibrillator BiV pace shock generator.  2. Successful venoplasty of the distal left subclavian vein.  3. It should be noted that this patient was placed in the [facility]       registry for followup of implantable cardioverter-defibrillator      biventricular devices.      Barnett Abu, M.D.  Electronically Signed     JHE/MEDQ  D:  04/18/2008  T:  04/19/2008  Job:  [id number]   cc:   Bryson Dames, M.D.

## 2011-03-23 NOTE — Discharge Summary (Signed)
NAME:  Emily, [name] NO.:  [id number]   MEDICAL RECORD NO.:  [id number]          PATIENT TYPE:  INP   LOCATION:  [id number]                         FACILITY:  [facility]   PHYSICIAN:  Bryson Dames, M.D.DATE OF BIRTH:  1951-04-18   DATE OF ADMISSION:  09/11/2007  DATE OF DISCHARGE:  09/13/2007                               DISCHARGE SUMMARY   HISTORY OF PRESENT ILLNESS:  [name] came to the emergency room  because of shortness of breath.  She is a 60 year old African American  female patient who has known coronary artery disease and ischemic  cardiomyopathy with an EF of 25-35%.  She stated that she thought she  had had problems with asthma for the last 4 weeks.  She said that over  the last few nights she was unable to lay down flat to sleep.  When she  would go to lay down it would sound like rocks rumbling.  Thus, she had  no sleep on Saturday and Sunday.  She took a nap during the day, but had  to sit up while sleeping.  She has had some dyspnea on exertion.  All of  this, she felt, was her asthma.  She was seen in the ER by Dr. Terance Ice.  She was diagnosed with acute on chronic systolic CHF and  admitted to the hospital.  He did interrogate her ICD.  She was  overriding the pacemaker portion.  She had no arrhythmias, atrial or  ventricular.  She had normal ICD function.  She was placed on IV Lasix  40 mg b.i.d. Her initial BNP was 680 on admission.  The ER doctor had  gotten a D-dimer.  It was mildly elevated at 0.83.  A CT scan was  ordered to rule out pulmonary embolus.  This was negative.  She was  given CHF education during her hospitalization.  She was seen by her  primary cardiologist, Dr. Melvern Banker on September 13, 2007, considered stable  to be discharged home.  Her discharge weight was 67.9, on admission was  69.5.  Her blood pressure was 135/94.  Her respirations 20, her pulse  was 75, temperature was 97.9.  Her sodium was 140, potassium 3.3,  BUN 6,  creatinine 0.86 and glucose was 87.  She was given some extra potassium  prior to her discharge.  Other labs:  D-dimer was 0.83.  Her CK MBs and  troponin were negative times four.  Hemoglobin 11.6, hematocrit 35.4,  platelets 241 and WBCs 7.6.   DISCHARGE MEDICATIONS:  1. Metformin 1000 mg two times per day.  2. Diovan 320 mg one time per day.  3. Metoprolol 100 mg two times per day.  4. Ferrous sulfate 325 mg two times per day.  5. Enteric-coated aspirin 325 mg one time per day.  6. Plavix 75 mg one time per day.  7. Docusate sodium p.r.n.  8. Nitroglycerin p.r.n.  9. Simvastatin 10 mg once a day.  10.Furosemide 40 mg one time per day.  11.Altace 10 mg two times per day.  12.Zetia 10 mg one time per day.  13.Omeprazole 20 mg  two times per day.  14.Rozerem 8 mg p.r.n.  15.Effexor XR 150 mg two times per day.  16.Xanax 1 mg four times a day p.r.n.  17.Flonase 0.5 nasal p.r.n.  18.Proventil two puffs inhaler, two puffs every 4 hours p.r.n.  19.Darvocet N-100 p.r.n.  20.Zyrtec 10 mg one a day.  21.Multivitamin one a day.  22.Muro ointment and eye drops daily.  23.B12 4 mg a day.  24.Potassium chloride 20 mEq every day was added.   STUDIES:  She had a CT scan that showed no pulmonary embolus.  She did  have small of bilateral pleural effusions with some bibasilar collapse.  She was given incentive spirometer.   DISCHARGE DIAGNOSES:  1. Acute on chronic congestive heart failure, systolic  2. Ischemic cardiomyopathy with an ejection fraction of 25-35%.  3. ASCVD with history of coronary bypass grafting with a LIMA to her      LAD and SVG to her diagonal, SVG to her PDA.  Her last cath was      May 11, 2007.  She had patent grafts, and she had native three-      vessel disease.  4. History of ablation for supraventricular tachycardia and Medtronic      AICD placed at [facility] June 27, 2007.  5. Non-insulin-dependent diabetes mellitus  6. Hypertension.  7. History of  mitral valve prolapse.  8. Asthma. .  9. Hiatal hernia and gastroesophageal reflux disease.  10.Stress, depression and anxiety.      Cyndia Bent, N.P.    ______________________________  Bryson Dames, M.D.    BB/MEDQ  D:  09/13/2007  T:  09/14/2007  Job:  [id number]   cc:   Ardeen Jourdain, M.D.

## 2011-03-23 NOTE — H&P (Signed)
NAME:  [name], [name] NO.:  [id number]   MEDICAL RECORD NO.:  [id number]          PATIENT TYPE:  INP   LOCATION:  1824                         FACILITY:  [facility]   PHYSICIAN:  Aquilla Hacker, M.D. DATE OF BIRTH:  August 01, 1951   DATE OF ADMISSION:  07/03/2007  DATE OF DISCHARGE:                              HISTORY & PHYSICAL   CARDIOLOGIST:  Bryson Dames, M.D.   CHIEF COMPLAINT:  Shortness of breath.   HISTORY OF PRESENT ILLNESS:  [name] is a 60 year old female with  a past medical history of hypertension, diabetes mellitus as well as  ischemic cardiomyopathy with a significantly reduced EF.  She indicates  that recently she had a defibrillator placed as well as an ablation  completed at [facility] on Wednesday, which was six days  ago, secondary to experiencing SVT.  On Thursday, the next day, plans  were to discharge the patient from the hospital.  However, she had some  bleeding from her groin site.  Therefore, she was discharged Friday,  June 30, 2007.  She felt fine after she was discharged from the  hospital.  This morning at approximately 2:00 a.m., she was awakened by  shortness of breath.  She indicates that it was very difficult for her  to catch her breath.  Her symptoms of shortness of breath occurred off  and on up until approximately 6:00 a.m., at which time she decided to  come to the hospital.  She indicated that each time she attempted to lie  flat, her shortness of breath became worse.  She had to sit up straight  in order to breath.  She typically uses two pillows to sleep.  However,  this morning, the only way that she could breath comfortably was to sit  up straight.  She became dizzy, lightheaded, and had a weird sensation  at the tip of her tongue.  She states that she has never had similar  symptoms before.  She denies having any chest pain.  She did experience  some nausea and a slight cough this morning.  She  had subjective febrile  filling.  However, she indicates that she measured her temperature  several times while at home, and her temperature registered in the 90s,  just above 99.  Her baseline functioning is somewhat limited in that she  occasionally does have two breaths after walking short distances.  She  also indicates that since her defibrillator was placed, she has noticed  some swelling around its site.   PAST MEDICAL HISTORY:  1. Medtronic defibrillator placed June 28, 2007, at [facility].  2. SVT.  3. History of MI.  4. Coronary artery disease.  5. Ischemic cardiomyopathy.  6. Paroxysmal supraventricular tachycardia.  7. Diabetes mellitus.  8. Hypertension.  9. Anxiety.  10.Depression.  11.The patient underwent a cardiac catheterization on May 11, 2007,      completed by Dr. Myrtice Lauth.  Per his dictation, the patient      had an ejection fraction of 25-35%.  12.Atypical ductal hyperplasia of the left breast.  13.Chest  pain consistent with unstable angina back in April 22, 2005.  14.Dyslipidemia.  15.Asthma.  16.Arthritis.   PAST SURGICAL HISTORY:  1. Coronary artery bypass graft involving three vessels.  2. Left breast lumpectomy completed by Dr. Kathrin Penner on January 11, 2007.   ALLERGIES:  CODEINE, TICLID, SULFA, PENICILLIN, TETRACYCLINE, DILTIAZEM,  TAZTIA, VICODIN, VERAPAMIL.   CURRENT MEDICATIONS:  1. Metformin HC 1000 mg b.i.d.  2. Diovan 320 mg daily.  3. Detrol LA 4 mg p.o. daily.  4. Metoprolol 75 mg p.o. b.i.d.  5. Enteric coated aspirin 325 mg p.o. daily.  6. Plavix 75 mg p.o. daily.  7. Docusate 2/100 mg p.o. daily.  8. Nitroglycerin 0.04 mg sublingual tablet p.r.n.  9. Simvastatin 10 mg one tablet p.o. daily.  10.Furosemide 20 mg one tablet p.o. daily.  11.Altace 10 mg one tablet p.o. b.i.d.  12.Zetia 10 mg one tablet p.o. daily.  13.Omeprazole 20 mg one tablet pill b.i.d.  14.Rozerem 8 mg p.r.n.  15.Effexor XR  150 mg p.o. b.i.d.  16.Xanax 1 mg t.i.d.  17.Muro 128 5% eye ointment nightly.  18.Muro 128 5% eye drops q.3-4 h.  19.Flonase 0.05% nasal spray p.r.n.  20.Proventil two puffs q.4 h.  21.Etodolac 400 mg.  22.Darvocet N-100 two tablets q.6 h p.r.n.  23.Zyrtec 10 mg p.o. daily.  24.Multivitamin one tablet daily.   SOCIAL HISTORY:  The patient is currently on disability and does not  work.  Cigarettes, denies alcohol.  The patient denies any recent  alcohol consumption, but states that she did drink occasionally socially  in the past.   FAMILY HISTORY:  Mother has history of CABG of only three vessels.  Mother had multiple TIAs and sufferers from Alzheimer's dementia as well  as hypertension.  Father had history of diabetes mellitus, end stage  renal disease and was on dialysis.  He had two strokes as well as a  myocardial infarction.  He died secondary to complications from his end  stage renal disease.   REVIEW OF SYSTEMS:  The patient denies having any chest pain, positive  shortness of breath.  No nausea, no vomiting.  No current urinary or  bowel related complaints.   PHYSICAL EXAMINATION:  GENERAL:  The patient is awake.  She appears to  be breathing better.  She speaks in full sentences.  She shows no  obvious distress.  She is sitting straight up on the stretcher.  She  does appear to be somewhat anxious when I ask her to lie flatter so that  I can examine her abdomen.  VITAL SIGNS:  Temperature 100.8, blood pressure 141/89, heart rate 100,  respirations 18, O2 saturation 100% on room air.  HEENT:  Normocephalic, atraumatic.  Anicteric.  Extraocular movements  intact.  Pupils equal, round and reactive to light.  Oral mucosa is  pink.  No thrush or no exudate.  NECK:  Neck veins are not prominent.  No lymphadenopathy.  No  thyromegaly.  CARDIAC:  S1, S2 present, regular rate and rhythm.  CHEST:  Left upper outer area is slightly swollen over the region of the   defibrillator.  There are numerous Steri-Strips present covering her  scar site.  ABDOMEN:  Soft, nontender, nondistended.  Positive bowel sounds.  No  masses.  No hepatosplenomegaly.  EXTREMITIES:  No leg edema.  NEUROLOGICAL:  The patient is alert and oriented x3.  MUSCULOSKELETAL:  Upper and lower extremity strength 5/5.  Cranial  nerves II-XII intact.  LABORATORY DATA:  BNP 553.  Sodium 138, potassium 4.1, chloride 102, CO2  28, glucose 106, BUN 7, creatinine 0.75, bilirubin total 0.5, alk-phos  48, SGOT 27, SGPT 21, total protein 6.7, albumin 3.3, calcium 9.0.  PT  13.2, INR 1.0.  White blood cell count 9.7, hemoglobin 9.0, hematocrit  27.5, platelets 233.   Chest x-ray reveals pulmonary edema versus pneumonia.   EKG reveals normal sinus rhythm.  Sinus tachycardia.  No obvious  pathologic Q waves are present.  Nonspecific T wave changes are seen  that is slight flattening of T waves.   ASSESSMENT/PLAN:  1. Acute respiratory distress.  Etiology of this may  be      multifactorial including contributions from an acute CHF      exacerbation as well as a pneumonia.  Will continue oxygen and will      start nebulized breathing treatments.  2. Probable pneumonia.  In light of the patient having a slight fever,      we will start the patient on empiric IV antibiotics, check sputum      as well as blood cultures.  3. Congestive heart failure exacerbation.  The precipitating factor      for this is questionable.  The relationship to her recently placed      defibrillator is questionable.  Will start the patient on IV Lasix.      Maintain strict I's and O's.  Will consult cardiology.  4. Recently placed defibrillator.  Again, will consult Cardiology.  5. History of coronary artery disease status post CABG.  The patient      currently denies having any chest pain, but given the fact that she      does have a history of coronary artery disease with a severely      reduced EF, will  cycle the patient's cardiac enzymes to rule out an      MI as contributing factor.  6. History of ischemic cardiomyopathy with a reduced EF by cardiac      cath done May 11, 2007.  Again, will consult Cardiology.  7. History of diabetes mellitus.  Will start Accucheck's as well as      sliding scale insulin.  8. History of hypertension.  Will resume the patient's previously      prescribed home medications.  9. History of anxiety and depression.  Will resume the patient's      Xanax.  10.Deep vein thrombosis prophylaxis.  Will provide Lovenox.  11.GI prophylaxis.  Will provide Protonix.  12.Anemia.  The source of this is questionable.  Will check the      patient's stool as well as iron studies.  13.Mild hypoalbuminemia.  Will check a prealbumin level.      Aquilla Hacker, M.D.  Electronically Signed     OR/MEDQ  D:  07/03/2007  T:  07/03/2007  Job:  [id number]   cc:   Ardeen Jourdain, M.D.  Bryson Dames, M.D.

## 2011-03-26 ENCOUNTER — Telehealth: Payer: Self-pay | Admitting: *Deleted

## 2011-03-26 NOTE — Telephone Encounter (Signed)
Got letter regarding scheduling her device check.  She has been with [facility] but got the letter from [place]. Please call her back with appt.

## 2011-03-26 NOTE — Consult Note (Signed)
NAMEMarland Kitchen  [name] NO.:  [id number]   MEDICAL RECORD NO.:  [id number]          PATIENT TYPE:  INP   LOCATION:  2028                         FACILITY:  [facility]   PHYSICIAN:  Nelwyn Salisbury, M.D.  DATE OF BIRTH:  1951-09-06   DATE OF CONSULTATION:  04/21/2005  DATE OF DISCHARGE:                                   CONSULTATION   REASON FOR CONSULTATION:  Anemia with drop in hemoglobin from 11.1 to 9.5 gm  per deciliter with guaiac-negative stools.   ASSESSMENT:  1.  Anemia. Question dilutional.  2.  Gastroesophageal reflux disease, on  proton pump inhibitor.  3.  Chronic constipation with melanosis coli and colonoscopy, on stool      softener.  4.  Unstable angina, on nitroglycerin at the present time as well aspirin,      Plavix, and Norvasc.  5.  Coronary artery disease, status post coronary artery bypass graft.  6.  History of stent placement.  7.  History of paroxysmal supraventricular tachycardia.  8.  Asthma.  9.  Hyperlipidemia.  10. Multiple drug allergies.  11. Diabetes mellitus, presently on sliding scale coverage.  12. Hypertension.  13. History of migraine headaches.  14. History of stroke in 2005.  15. History of anxiety and depression.  16. S/P Hysterectomy and surgery for a tubal pregnancy in the remote past.   RECOMMENDATIONS:  Monitor serial CBCs. If the hemoglobin continues to drop  an EGD will be done. He has had a recent colonoscopy in March 2006 that was  unrevealing except for small internal hemorrhoids. No masses or polyps were  seen. There was no evidence of diverticulosis.   DISCUSSION:  [name] is a 60 year old African American female  with multiple medical problems who was in the cardiac rehab yesterday on the  treadmill when she developed chest pain. As the chest pain did not resolve  with discontinuation of exercise she went to the emergency room and was  admitted for further observation. She denied any abdominal  pain, melena, or  hematochezia. Appetite and weight has been stable. She had some nausea with  the chest pain, but with resolution of the chest pain the nausea has not  resolved. She has a history of mild constipation and uses stool softeners  and prunes at home.   PAST MEDICAL HISTORY:  See list above.   MEDICATIONS IN THE HOSPITAL:  1.  Aspirin 81 mg daily.  2.  Plavix 75 mg daily.  3.  Phenergan 25 mg p.r.n.  4.  Morphine 2 mg p.r.n. for pain.  5.  Norvasc 2.5 mg daily.  6.  Sliding scale insulin coverage.  7.  Colace p.r.n.  8.  Toprol XL 100 mg p.o. daily.  9.  Multivitamins.  10. Vitamin E.  11. Diovan 325 mg p.o. daily.  12. Protonix 40 mg p.o. daily.  13. Zocor 10 mg p.o. daily.  14. Zetia 10 mg p.o. q.h.s.  15. Lasix 20 mg p.o. daily.  16. Effexor 150 mg p.o. b.i.d.  17. Altace 10 mg p.o. b.i.d.  18. Alprazolam 1 mg p.o. t.i.d.  19.  Nasonex spray daily.   She is also on albuterol, Lodine, and was heparin that has now been  discontinued since her drop in her hemoglobin. She is also on rozerem 8 mg  p.o. q.h.s. p.r.n., Darvocet p.r.n., guaifenesin p.r.n.   She has multiple allergies including CODEINE, SULFA, ERYTHROMYCIN,  PENICILLIN, and VERAPAMIL.   SOCIAL HISTORY:  She is married and is disabled from her medical problems.  She lives with her husband in [place]. She denies the use of alcohol,  tobacco, or drugs.   FAMILY HISTORY:  Does have a family history of heart disease, hypertension,  and diabetes. No family history of breast, ovarian, cervical, or colon  cancer.   PHYSICAL EXAMINATION:  GENERAL: A pleasant, middle-aged, African American  female in no acute distress, sitting comfortably in bed with stable vital  signs.  VITAL SIGNS: Afebrile.  NECK: Supple.  CHEST: Clear to auscultation.  HEART: S1 and S2 regular.  LUNGS: No rales, rhonchi, or wheezes heard. There is a 1/6 systolic murmur  present.  ABDOMEN: Guaiac examination done by the PA  reveals guaiac-negative stools.  There are no other masses evaluation.   Laboratory examination reveal hemoglobin of 11.1 on admission with platelet  count of 245,000 and white count of 8.4.  Repeat hemoglobin today was 8.3  and repeated on an emergent basis is 9.5. Potassium 3.3, BUN 7, creatinine  0.9, glucose 141, CO2 26, chloride 102, sodium 136.   Plans are as above. Further recommendations will made in follow up.       JNM/MEDQ  D:  04/21/2005  T:  04/21/2005  Job:  [id number]   cc:   Bryson Dames, M.D.  [address], [address] [place] [address]  Fax: [phone]   Ardeen Jourdain, M.D.  [address]  [place]  [place] [address]  Fax: [place] Love, M.D.  [address] [place] [address]  Fax: [phone]   Brayton Caves, M.D.  [address]  [place], [place] [address]  Fax: [phone]

## 2011-03-26 NOTE — Procedures (Signed)
[facility]  Patient:    [name], [name]                  MRN: [id number] Proc. Date: 04/12/00 Adm. Date:  [id number] Disc. Date: [id number] Attending:  Juanita Craver CC:         Marylene Land, M.D.                           Procedure Report  DATE OF BIRTH:  1951/05/14.  REFERRING PHYSICIAN:  Marylene Land, M.D.  PROCEDURE PERFORMED:  Colonoscopy.  ENDOSCOPIST:  Nelwyn Salisbury, M.D.  INSTRUMENT USED:  Olympus video colonoscope.  INDICATION FOR PROCEDURE:  Iron deficiency anemia and recent rectal bleeding in a 60 year old black female; rule out colonic polyps, masses, hemorrhoids, etc.  PREPROCEDURE PREPARATION:  Informed consent was procured from the patient. The patient was fasted for eight hours prior to the procedure and prepped with a bottle of magnesium citrate and a gallon of NuLytely the night prior to the procedure.  PREPROCEDURE PHYSICAL:  VITAL SIGNS:  Patient had stable vital signs.  NECK:  Supple.  CHEST:  Clear to auscultation.  S1 and S2 regular.  ABDOMEN:  Soft with normal abdominal bowel sounds.  DESCRIPTION OF PROCEDURE:  The patient was placed in the left lateral decubitus position and an additional 20 mg of Demerol were used for sedation; patient had received Demerol and Versed for the EGD as well.  Once the patient was adequately positioned and maintained on low-flow oxygen and continuous cardiac monitoring, the Olympus video colonoscope was advanced from the rectum to the cecum without difficulty.  Except for changes consistent with melanosis coli throughout the colonic mucosa and more prominent changes on the right side, no other abnormalities were seen.  There was no evidence of diverticulosis, masses, hemorrhoids, polyps, etc.  The patient tolerated the procedure well without complication.  IMPRESSION: 1. Normal colonoscopy except for changes consistent with melanosis coli    throughout the  colon, more prominent changes on the right side. 2. Some residual stool in the right colon.  RECOMMENDATION:  To further workup her iron deficiency anemia, a small-bowel follow-through will be scheduled on an outpatient basis and further recommendations made as needed. DD:  04/12/00 TD:  04/15/00 Job: [id number]

## 2011-03-26 NOTE — Discharge Summary (Signed)
NAME:  [name] NO.:  [id number]   MEDICAL RECORD NO.:  [id number]          PATIENT TYPE:  INP   LOCATION:  [id number]                         FACILITY:  [facility]   PHYSICIAN:  Barnett Abu, M.D.  DATE OF BIRTH:  1951-08-28   DATE OF ADMISSION:  04/17/2008  DATE OF DISCHARGE:  04/19/2008                               DISCHARGE SUMMARY   DISCHARGE DIAGNOSES:  1. Severe ischemic cardiomyopathy, ejection fraction 15%.  2. New York Heart Association class IV symptoms of congestive heart      failure in the setting of severe ischemic and nonischemic      cardiomyopathy.  3. Severe mitral regurgitation.  4. Hypertension.  5. Diabetes.  6. Hyperlipidemia.  7. History of implantable cardioverter-defibrillator insertion.  8. Severe recent worsening of left ventricular function with worsening      of symptoms, now NYHA class III-IV.   [name] is a 60 year old female patient of Dr. Myrtice Lauth who  has severe symptomatic congestive heart failure.  She has a long history  of cardiomyopathy beginning about 20 years ago.  She has had cardiac  catheterization with multiple coronary interventions with the last one  being in early May 2009 that showed EF of 15%-20% with dilated LV and  moderate-to-severe mitral regurgitation, bypass grafts were patent at  that time.  She now endorses New York Heart Association class III-IV  symptoms of dyspnea.  She has had a PND and orthopnea.  Her symptoms  improve when she takes metolazone.   It is felt that she may benefit from her left ventricular lead for BiV  pacing system.  She initially had her ICD placed with leads in August  2008.   Dr. Leonia Reeves saw the patient in consultation for BiV upgrade who agrees  that this would most likely help her situation.  She was brought into  the hospital on April 17, 2008, and underwent an upgrade from an ICD to  an ICD BiV device, Medtronic.  Defibrillator testing recorded the  following day without problems.  On April 19, 2008, the patient was felt  to be ready for discharge to home.   DISCHARGE MEDICATIONS:  1. Metformin 1000 mg twice a day.  2. Ranexa 500 mg twice a day.  3. Diovan 320 mg a day.  4. Detrol LA 4 mg a day.  5. Metoprolol 100 mg twice a day.  6. Iron daily.  7. Enteric-coated aspirin 325 mg a day.  8. Plavix 75 mg a day.  9. Digoxin 0.25 mg daily.  10.Colace 200 mg q.a.m.  11.Sublingual nitroglycerin p.r.n. chest pain.  12.Simvastatin 20 mg daily.   The patient is to remain on low-sodium, heart-healthy, diabetic diet.  Activity, wound care instruction sheets were given to the patient.  The  patient is to follow up for one-time visit with Dr. Leonia Reeves and Virgilio Belling nurse practitioner for wound check on April 22, 2008, at 3:15 p.m.  She is then to make an appointment to see Dr. Melvern Banker in 2 weeks.      [name], P.A.      Barnett Abu,  M.D.  Electronically Signed    LB/MEDQ  D:  05/22/2008  T:  05/22/2008  Job:  [id number]   cc:   Bryson Dames, M.D.

## 2011-03-26 NOTE — Telephone Encounter (Signed)
Pt will be scheduled with dr Rayann Heman

## 2011-04-10 ENCOUNTER — Emergency Department (HOSPITAL_BASED_OUTPATIENT_CLINIC_OR_DEPARTMENT_OTHER)
Admission: EM | Admit: 2011-04-10 | Discharge: 2011-04-10 | Disposition: A | Discharge status: Home / Self Care | Payer: 59 | Attending: Emergency Medicine | Admitting: Emergency Medicine

## 2011-04-10 DIAGNOSIS — K219 Gastro-esophageal reflux disease without esophagitis: Secondary | ICD-10-CM | POA: Insufficient documentation

## 2011-04-10 DIAGNOSIS — Z95810 Presence of automatic (implantable) cardiac defibrillator: Secondary | ICD-10-CM | POA: Insufficient documentation

## 2011-04-10 DIAGNOSIS — I2510 Atherosclerotic heart disease of native coronary artery without angina pectoris: Secondary | ICD-10-CM | POA: Insufficient documentation

## 2011-04-10 DIAGNOSIS — Z8679 Personal history of other diseases of the circulatory system: Secondary | ICD-10-CM | POA: Insufficient documentation

## 2011-04-10 DIAGNOSIS — E119 Type 2 diabetes mellitus without complications: Secondary | ICD-10-CM | POA: Insufficient documentation

## 2011-04-10 DIAGNOSIS — Z79899 Other long term (current) drug therapy: Secondary | ICD-10-CM | POA: Insufficient documentation

## 2011-04-10 DIAGNOSIS — I509 Heart failure, unspecified: Secondary | ICD-10-CM | POA: Insufficient documentation

## 2011-04-10 DIAGNOSIS — R1011 Right upper quadrant pain: Secondary | ICD-10-CM | POA: Insufficient documentation

## 2011-04-10 DIAGNOSIS — J45909 Unspecified asthma, uncomplicated: Secondary | ICD-10-CM | POA: Insufficient documentation

## 2011-04-10 LAB — URINE MICROSCOPIC-ADD ON

## 2011-04-10 LAB — DIFFERENTIAL
Basophils Absolute: 0 10*3/uL (ref 0.0–0.1)
Eosinophils Relative: 3 % (ref 0–5)
Lymphocytes Relative: 36 % (ref 12–46)
Lymphs Abs: 3 10*3/uL (ref 0.7–4.0)
Neutro Abs: 4.4 10*3/uL (ref 1.7–7.7)
Neutrophils Relative %: 53 % (ref 43–77)

## 2011-04-10 LAB — CBC
HCT: 33.4 % — ABNORMAL LOW (ref 36.0–46.0)
MCV: 82.9 fL (ref 78.0–100.0)
Platelets: 279 10*3/uL (ref 150–400)
RBC: 4.03 MIL/uL (ref 3.87–5.11)
RDW: 12.5 % (ref 11.5–15.5)
WBC: 8.2 10*3/uL (ref 4.0–10.5)

## 2011-04-10 LAB — COMPREHENSIVE METABOLIC PANEL
Albumin: 4.5 g/dL (ref 3.5–5.2)
BUN: 13 mg/dL (ref 6–23)
Calcium: 10 mg/dL (ref 8.4–10.5)
Glucose, Bld: 97 mg/dL (ref 70–99)
Total Protein: 8.3 g/dL (ref 6.0–8.3)

## 2011-04-10 LAB — URINALYSIS, ROUTINE W REFLEX MICROSCOPIC
Bilirubin Urine: NEGATIVE
Hgb urine dipstick: NEGATIVE
Nitrite: NEGATIVE
Specific Gravity, Urine: 1.009 (ref 1.005–1.030)
Urobilinogen, UA: 0.2 mg/dL (ref 0.0–1.0)
pH: 7 (ref 5.0–8.0)

## 2011-04-10 LAB — LIPASE, BLOOD: Lipase: 11 U/L (ref 11–59)

## 2011-04-11 ENCOUNTER — Other Ambulatory Visit (HOSPITAL_COMMUNITY): Payer: Self-pay | Admitting: Emergency Medicine

## 2011-04-11 ENCOUNTER — Other Ambulatory Visit (HOSPITAL_BASED_OUTPATIENT_CLINIC_OR_DEPARTMENT_OTHER): Payer: 59

## 2011-04-11 ENCOUNTER — Ambulatory Visit (HOSPITAL_COMMUNITY)
Admission: RE | Admit: 2011-04-11 | Discharge: 2011-04-11 | Disposition: A | Discharge status: Home / Self Care | Payer: 59 | Source: Ambulatory Visit | Attending: Emergency Medicine | Admitting: Emergency Medicine

## 2011-04-11 DIAGNOSIS — R109 Unspecified abdominal pain: Secondary | ICD-10-CM | POA: Insufficient documentation

## 2011-04-11 DIAGNOSIS — R112 Nausea with vomiting, unspecified: Secondary | ICD-10-CM | POA: Insufficient documentation

## 2011-06-17 ENCOUNTER — Encounter: Payer: Self-pay | Admitting: Internal Medicine

## 2011-06-18 ENCOUNTER — Ambulatory Visit (INDEPENDENT_AMBULATORY_CARE_PROVIDER_SITE_OTHER): Payer: 59 | Admitting: Internal Medicine

## 2011-06-18 ENCOUNTER — Encounter: Payer: Self-pay | Admitting: Internal Medicine

## 2011-06-18 DIAGNOSIS — I471 Supraventricular tachycardia, unspecified: Secondary | ICD-10-CM | POA: Insufficient documentation

## 2011-06-18 DIAGNOSIS — I2510 Atherosclerotic heart disease of native coronary artery without angina pectoris: Secondary | ICD-10-CM

## 2011-06-18 DIAGNOSIS — I509 Heart failure, unspecified: Secondary | ICD-10-CM

## 2011-06-18 DIAGNOSIS — I25709 Atherosclerosis of coronary artery bypass graft(s), unspecified, with unspecified angina pectoris: Secondary | ICD-10-CM

## 2011-06-18 DIAGNOSIS — I5022 Chronic systolic (congestive) heart failure: Secondary | ICD-10-CM

## 2011-06-18 DIAGNOSIS — Z95810 Presence of automatic (implantable) cardiac defibrillator: Secondary | ICD-10-CM | POA: Insufficient documentation

## 2011-06-18 DIAGNOSIS — I428 Other cardiomyopathies: Secondary | ICD-10-CM

## 2011-06-18 HISTORY — DX: Supraventricular tachycardia, unspecified: I47.10

## 2011-06-18 HISTORY — DX: Presence of automatic (implantable) cardiac defibrillator: Z95.810

## 2011-06-18 HISTORY — DX: Supraventricular tachycardia: I47.1

## 2011-06-18 HISTORY — DX: Atherosclerosis of coronary artery bypass graft(s), unspecified, with unspecified angina pectoris: I25.709

## 2011-06-18 NOTE — Patient Instructions (Signed)
Your physician wants you to follow-up in:  12 months.  You will receive a reminder letter in the mail two months in advance. If you don't receive a letter, please call our office to schedule the follow-up appointment.   

## 2011-06-18 NOTE — Assessment & Plan Note (Signed)
Her symptoms are well controlled. Interrogation of her ICD demonstrates no recent episodes of SVT. A  period of watchful waiting will continue.

## 2011-06-18 NOTE — Progress Notes (Signed)
HPI [name] is referred today by Dr. Marlou Porch for ongoing ICD evaluation and management along with follow up of her SVT. The patient is a pleasant 60 year old woman with a long-standing history of coronary artery disease status post myocardial infarction. She underwent bypass surgery nearly 15 years ago. The patient has left ventricular dysfunction and underwent ICD implantation in 2008. The details of her redo surgery are not completely clear but she underwent insertion of a biventricular ICD in 2009. Prior to that she had tachycardia palpitations and SVT. This resolved after ICD implantation. The patient denies chest pain or shortness of breath. She remains active. Allergies  Allergen Reactions  . Codeine   . Digoxin And Related   . Diltiazem   . Latex     Latex tape  . Metolazone   . Morphine Sulfate   . Penicillins   . Septra Ds (Sulfamethoxazole W/Trimethoprim (Co-Trimoxazole))   . Spironolactone   . Sulfa Drugs Cross Reactors   . Taztia Xt (Diltiazem Hcl)   . Tetracyclines & Related   . Ticlid (Ticlopidine Hcl)   . Verapamil   . Vicodin (Hydrocodone-Acetaminophen)      Current Outpatient Prescriptions  Medication Sig Dispense Refill  . Albuterol (PROVENTIL IN) Inhale into the lungs. 2 puffs every 4 hours       . ALPRAZolam (XANAX) 1 MG tablet Take 1 mg by mouth. As directed       . Artificial Tear GEL Apply to eye. 2 drops as needed       . aspirin 325 MG EC tablet Take 325 mg by mouth daily.        . clopidogrel (PLAVIX) 75 MG tablet 1 tab daily      . DIOVAN 320 MG tablet 1 tab daily      . docusate sodium (COLACE) 100 MG capsule Take 100 mg by mouth 2 (two) times daily. As needed       . fluticasone (FLONASE) 50 MCG/ACT nasal spray Place 2 sprays into the nose daily.        . furosemide (LASIX) 80 MG tablet 2 (two) times daily. 1 tab daily      . metFORMIN (GLUCOPHAGE) 1000 MG tablet 2 (two) times daily with a meal. 1 tab daily      . metoprolol (LOPRESSOR) 100 MG  tablet 1 tab twice a day      . nitroGLYCERIN (NITROSTAT) 0.4 MG SL tablet Place 0.4 mg under the tongue every 5 (five) minutes as needed.        . NON FORMULARY Loratadine / or zyrtec 1 daily in the am       . omeprazole (PRILOSEC) 40 MG capsule 1 tab daily      . oxybutynin (DITROPAN-XL) 10 MG 24 hr tablet 1 tab daily prn      . pediatric multivitamin-iron (POLY-VI-SOL WITH IRON) solution Take 1 mL by mouth daily.        . potassium chloride SA (K-DUR,KLOR-CON) 20 MEQ tablet 1 tab twice a day      . promethazine (PHENERGAN) 25 MG tablet As needed      . ramipril (ALTACE) 10 MG capsule Take 10 mg by mouth daily.        . simvastatin (ZOCOR) 20 MG tablet 1 tab daily      . sodium chloride (MURO 128) 5 % ophthalmic ointment 1 drop.        . valACYclovir (VALTREX) 1000 MG tablet       . venlafaxine (  EFFEXOR) 100 MG tablet 2 tabs am 1 tab in pm      . ZETIA 10 MG tablet 1 tab daily         Past Medical History  Diagnosis Date  . Ischemic cardiomyopathy     status post biventricular ICD placed by DR Edumunds who used to see Dr Melvern Banker here to establish  cardiovascular care.  . Diabetes mellitus   . Hypertension   . Coronary artery disease   . CHF (congestive heart failure)     ROS:   All systems reviewed and negative except as noted in the HPI.   Past Surgical History  Procedure Date  . Insert / replace / remove pacemaker     biventricular defibrillator--06/10/ 2009     No family history on file.   History   Social History  . Marital Status: Married    Spouse Name: N/A    Number of Children: N/A  . Years of Education: N/A   Occupational History  . Not on file.   Social History Main Topics  . Smoking status: Never Smoker   . Smokeless tobacco: Not on file  . Alcohol Use: Not on file  . Drug Use: Not on file  . Sexually Active: Not on file   Other Topics Concern  . Not on file   Social History Narrative  . No narrative on file     BP 116/81  Pulse 76  Ht  5\' 4"  (1.626 m)  Wt 151 lb (68.493 kg)  BMI 25.92 kg/m2  Physical Exam:  Well appearing middle aged woman, NAD HEENT: Unremarkable Neck:  No JVD, no thyromegally Lymphatics:  No adenopathy Back:  No CVA tenderness Lungs:  Clear. Well-healed ICD incision HEART:  Regular rate rhythm, no murmurs, no rubs, no clicks Abd:  soft, positive bowel sounds, no organomegally, no rebound, no guarding Ext:  2 plus pulses, no edema, no cyanosis, no clubbing Skin:  No rashes no nodules Neuro:  CN II through XII intact, motor grossly intact  DEVICE  Normal device function.  See PaceArt for details.   Assess/Plan:

## 2011-06-18 NOTE — Assessment & Plan Note (Signed)
She denies anginal symptoms. She'll continue her current medical therapy. She has some arthritic complaints but I've asked that she increase her activity level.

## 2011-06-18 NOTE — Assessment & Plan Note (Signed)
Her device is working normally and the battery longevity is still 3-4 years. We'll plan to recheck her device in several months.

## 2011-07-30 LAB — I-STAT 8, (EC8 V) (CONVERTED LAB)
BUN: 14
Chloride: 102
Glucose, Bld: 104 — ABNORMAL HIGH
HCT: 39
Hemoglobin: 13.3
Operator id: 272551
Sodium: 137

## 2011-07-30 LAB — DIFFERENTIAL
Basophils Absolute: 0
Eosinophils Absolute: 0.1
Eosinophils Relative: 1
Lymphocytes Relative: 30
Lymphs Abs: 2.9
Monocytes Absolute: 0.6

## 2011-07-30 LAB — CBC
HCT: 36
Hemoglobin: 11.8 — ABNORMAL LOW
MCV: 89.6
RDW: 15.9 — ABNORMAL HIGH

## 2011-07-30 LAB — POCT CARDIAC MARKERS
CKMB, poc: <1 — ABNORMAL LOW
Operator id: 272551

## 2011-07-30 LAB — B-NATRIURETIC PEPTIDE (CONVERTED LAB): Pro B Natriuretic peptide (BNP): 555 — ABNORMAL HIGH

## 2011-08-02 LAB — POCT I-STAT, CHEM 8
Calcium, Ion: 1.06 — ABNORMAL LOW
Glucose, Bld: 81
HCT: 38
Hemoglobin: 12.9
TCO2: 30

## 2011-08-02 LAB — URINE MICROSCOPIC-ADD ON

## 2011-08-02 LAB — POCT CARDIAC MARKERS
CKMB, poc: <1 — ABNORMAL LOW
CKMB, poc: <1 — ABNORMAL LOW
Myoglobin, poc: 34
Myoglobin, poc: 37.7
Troponin i, poc: <0.05

## 2011-08-02 LAB — BASIC METABOLIC PANEL
BUN: 10
Chloride: 101
Creatinine, Ser: 0.99
GFR calc non Af Amer: 58 — ABNORMAL LOW
Glucose, Bld: 103 — ABNORMAL HIGH

## 2011-08-02 LAB — PROTIME-INR
INR: 1
Prothrombin Time: 13.6

## 2011-08-02 LAB — CBC
Hemoglobin: 11 — ABNORMAL LOW
Hemoglobin: 11.8 — ABNORMAL LOW
MCHC: 33.3
MCHC: 33.3
MCV: 91.3
MCV: 91.9
RBC: 3.6 — ABNORMAL LOW
RBC: 3.87

## 2011-08-02 LAB — HEMOGLOBIN A1C
Hgb A1c MFr Bld: 6.4 — ABNORMAL HIGH
Mean Plasma Glucose: 151

## 2011-08-02 LAB — COMPREHENSIVE METABOLIC PANEL
ALT: 44 — ABNORMAL HIGH
CO2: 27
Calcium: 8.9
GFR calc non Af Amer: >60
Glucose, Bld: 112 — ABNORMAL HIGH
Sodium: 139

## 2011-08-02 LAB — URINALYSIS, ROUTINE W REFLEX MICROSCOPIC
Glucose, UA: NEGATIVE
Hgb urine dipstick: NEGATIVE
Specific Gravity, Urine: 1.017
pH: 7

## 2011-08-02 LAB — CARDIAC PANEL(CRET KIN+CKTOT+MB+TROPI)
CK, MB: 0.9
Total CK: 36
Troponin I: 0.01
Troponin I: <0.01

## 2011-08-02 LAB — DIFFERENTIAL
Basophils Relative: 1
Eosinophils Absolute: 0.2
Eosinophils Absolute: 0.3
Eosinophils Relative: 3
Monocytes Absolute: 0.4
Monocytes Absolute: 0.5
Monocytes Relative: 5
Neutrophils Relative %: 70

## 2011-08-02 LAB — URINE CULTURE

## 2011-08-03 LAB — BASIC METABOLIC PANEL
CO2: 31
Chloride: 99
GFR calc non Af Amer: 59 — ABNORMAL LOW
Glucose, Bld: 102 — ABNORMAL HIGH
Potassium: 4.4
Sodium: 139

## 2011-08-05 LAB — DIFFERENTIAL
Basophils Relative: 0
Monocytes Absolute: 0.6
Monocytes Relative: 6
Neutro Abs: 7.4

## 2011-08-05 LAB — CBC
HCT: 32.2 — ABNORMAL LOW
Hemoglobin: 10.8 — ABNORMAL LOW
MCHC: 34
MCV: 90.1
Platelets: 248
RBC: 3.54 — ABNORMAL LOW
RDW: 13.7

## 2011-08-05 LAB — POCT I-STAT, CHEM 8
BUN: 8
Calcium, Ion: 1.14
Chloride: 94 — ABNORMAL LOW
Glucose, Bld: 88
HCT: 33 — ABNORMAL LOW

## 2011-08-05 LAB — BASIC METABOLIC PANEL
BUN: 6
CO2: 34 — ABNORMAL HIGH
Chloride: 94 — ABNORMAL LOW
Glucose, Bld: 84
Potassium: 4.1

## 2011-08-05 LAB — URINALYSIS, ROUTINE W REFLEX MICROSCOPIC
Bilirubin Urine: NEGATIVE
Glucose, UA: NEGATIVE
Hgb urine dipstick: NEGATIVE
Ketones, ur: NEGATIVE
pH: 5.5

## 2011-08-17 LAB — I-STAT 8, (EC8 V) (CONVERTED LAB)
BUN: 8
Bicarbonate: 31.3 — ABNORMAL HIGH
Chloride: 103
Glucose, Bld: 99
Hemoglobin: 14.6
Sodium: 140

## 2011-08-17 LAB — TROPONIN I: Troponin I: 0.02

## 2011-08-17 LAB — COMPREHENSIVE METABOLIC PANEL
ALT: 55 — ABNORMAL HIGH
Albumin: 3.4 — ABNORMAL LOW
Alkaline Phosphatase: 66
Potassium: 3.6
Sodium: 143
Total Protein: 6.4

## 2011-08-17 LAB — D-DIMER, QUANTITATIVE: D-Dimer, Quant: 0.83 — ABNORMAL HIGH

## 2011-08-17 LAB — BASIC METABOLIC PANEL
BUN: 7
CO2: 27
CO2: 31
Chloride: 101
Chloride: 103
GFR calc non Af Amer: >60
Glucose, Bld: 87
Glucose, Bld: 91
Potassium: 3.3 — ABNORMAL LOW
Potassium: 3.9
Sodium: 140
Sodium: 140

## 2011-08-17 LAB — CBC
HCT: 38.1
Hemoglobin: 11.6 — ABNORMAL LOW
MCHC: 32.7
MCV: 86.1
RBC: 4.06
RBC: 4.42
WBC: 11.7 — ABNORMAL HIGH

## 2011-08-17 LAB — POCT CARDIAC MARKERS: CKMB, poc: <1 — ABNORMAL LOW

## 2011-08-17 LAB — CK TOTAL AND CKMB (NOT AT ARMC): CK, MB: 0.6

## 2011-08-17 LAB — CARDIAC PANEL(CRET KIN+CKTOT+MB+TROPI)
CK, MB: 0.5
CK, MB: 0.5
Relative Index: INVALID
Relative Index: INVALID
Total CK: 31
Total CK: 32
Troponin I: 0.01

## 2011-08-17 LAB — DIFFERENTIAL
Eosinophils Absolute: 0.1
Eosinophils Relative: 1
Lymphocytes Relative: 27
Lymphs Abs: 3.2
Monocytes Relative: 9
Neutrophils Relative %: 63

## 2011-08-17 LAB — POCT I-STAT CREATININE
Creatinine, Ser: 0.8
Operator id: 198171

## 2011-08-20 LAB — COMPREHENSIVE METABOLIC PANEL
Alkaline Phosphatase: 48
BUN: 7
Glucose, Bld: 106 — ABNORMAL HIGH
Potassium: 4.1
Total Protein: 6.7

## 2011-08-20 LAB — CBC
HCT: 27.5 — ABNORMAL LOW
Hemoglobin: 9 — ABNORMAL LOW
MCHC: 32.6
RDW: 14.1 — ABNORMAL HIGH

## 2011-08-20 LAB — IRON AND TIBC
Saturation Ratios: 15 — ABNORMAL LOW
TIBC: 337

## 2011-08-20 LAB — CK TOTAL AND CKMB (NOT AT ARMC)
CK, MB: 0.6
CK, MB: 0.7
Relative Index: INVALID
Total CK: 55
Total CK: 78

## 2011-08-20 LAB — DIFFERENTIAL
Basophils Absolute: 0
Basophils Relative: 0
Monocytes Relative: 7
Neutro Abs: 6.7
Neutrophils Relative %: 70

## 2011-08-20 LAB — BASIC METABOLIC PANEL
BUN: 6
CO2: 34 — ABNORMAL HIGH
Chloride: 100
Chloride: 100
Glucose, Bld: 127 — ABNORMAL HIGH
Glucose, Bld: 150 — ABNORMAL HIGH
Potassium: 3.2 — ABNORMAL LOW
Potassium: 3.6
Sodium: 140
Sodium: 143

## 2011-08-20 LAB — CULTURE, BLOOD (ROUTINE X 2): Culture: NO GROWTH

## 2011-08-20 LAB — PROTIME-INR
INR: 1
Prothrombin Time: 13.2

## 2011-08-20 LAB — TSH: TSH: 0.618

## 2011-08-20 LAB — D-DIMER, QUANTITATIVE: D-Dimer, Quant: 1.41 — ABNORMAL HIGH

## 2011-08-24 LAB — POCT CARDIAC MARKERS
Myoglobin, poc: 41.3
Operator id: 272551

## 2011-08-24 LAB — CBC
Hemoglobin: 10.2 — ABNORMAL LOW
MCHC: 32.5
MCHC: 32.9
MCV: 87.8
MCV: 88.4
Platelets: 295
RBC: 3.56 — ABNORMAL LOW

## 2011-08-24 LAB — I-STAT 8, (EC8 V) (CONVERTED LAB)
Acid-Base Excess: 4 — ABNORMAL HIGH
Chloride: 103
Hemoglobin: 12.2
Potassium: 3.6
Sodium: 138
pH, Ven: 7.506 — ABNORMAL HIGH

## 2011-08-24 LAB — BASIC METABOLIC PANEL
CO2: 28
Chloride: 105
GFR calc Af Amer: >60
Potassium: 3.4 — ABNORMAL LOW
Sodium: 138

## 2011-08-24 LAB — DIFFERENTIAL
Basophils Relative: 0
Eosinophils Absolute: 0.1
Eosinophils Relative: 1
Monocytes Relative: 9
Neutrophils Relative %: 65

## 2011-08-24 LAB — POCT I-STAT CREATININE: Creatinine, Ser: 0.8

## 2011-08-26 LAB — I-STAT 8, (EC8 V) (CONVERTED LAB)
Bicarbonate: 26.7 — ABNORMAL HIGH
HCT: 40
Hemoglobin: 13.6
Operator id: 288331
Sodium: 138
TCO2: 28
pCO2, Ven: 32.1 — ABNORMAL LOW

## 2011-08-26 LAB — PROTIME-INR
INR: 0.9
Prothrombin Time: 12.6

## 2011-08-26 LAB — POCT CARDIAC MARKERS
CKMB, poc: <1 — ABNORMAL LOW
Troponin i, poc: <0.05

## 2011-08-26 LAB — POCT I-STAT CREATININE: Creatinine, Ser: 0.7

## 2011-08-26 LAB — DIFFERENTIAL
Basophils Absolute: 0
Eosinophils Relative: 1
Lymphocytes Relative: 42
Lymphs Abs: 3.5 — ABNORMAL HIGH
Neutro Abs: 4.1
Neutrophils Relative %: 49

## 2011-08-26 LAB — CBC
HCT: 38
Platelets: 258
RDW: 14.6 — ABNORMAL HIGH
WBC: 8.4

## 2011-08-26 LAB — APTT: aPTT: 35

## 2011-09-16 ENCOUNTER — Encounter: Payer: 59 | Admitting: *Deleted

## 2011-09-22 ENCOUNTER — Encounter: Payer: Self-pay | Admitting: *Deleted

## 2011-12-22 ENCOUNTER — Telehealth: Payer: Self-pay | Admitting: Internal Medicine

## 2011-12-22 ENCOUNTER — Ambulatory Visit (INDEPENDENT_AMBULATORY_CARE_PROVIDER_SITE_OTHER): Payer: 59 | Admitting: *Deleted

## 2011-12-22 ENCOUNTER — Encounter: Payer: Self-pay | Admitting: Internal Medicine

## 2011-12-22 DIAGNOSIS — I471 Supraventricular tachycardia: Secondary | ICD-10-CM

## 2011-12-22 DIAGNOSIS — I5022 Chronic systolic (congestive) heart failure: Secondary | ICD-10-CM

## 2011-12-22 NOTE — Telephone Encounter (Signed)
12-22-11 called pt, was in dr office will call back, missed home transmission and needs to do one anytime/mt

## 2011-12-24 LAB — REMOTE ICD DEVICE
AL AMPLITUDE: 2.7 mv
AL IMPEDENCE ICD: 552 Ohm
ATRIAL PACING ICD: 0.05 pct
BAMS-0001: 170 {beats}/min
BATTERY VOLTAGE: 3.04 V
LV LEAD IMPEDENCE ICD: 792 Ohm
RV LEAD IMPEDENCE ICD: 480 Ohm
TOT-0002: 0
TOT-0006: 20090610000000
TZAT-0001ATACH: 2
TZAT-0002ATACH: NEGATIVE
TZAT-0004SLOWVT: 8
TZAT-0004SLOWVT: 8
TZAT-0005SLOWVT: 88 pct
TZAT-0005SLOWVT: 91 pct
TZAT-0011SLOWVT: 10 ms
TZAT-0012ATACH: 150 ms
TZAT-0012ATACH: 150 ms
TZAT-0012FASTVT: 200 ms
TZAT-0012SLOWVT: 200 ms
TZAT-0012SLOWVT: 200 ms
TZAT-0018ATACH: NEGATIVE
TZAT-0018FASTVT: NEGATIVE
TZAT-0019FASTVT: 8 V
TZAT-0020ATACH: 1.5 ms
TZAT-0020ATACH: 1.5 ms
TZAT-0020FASTVT: 1.5 ms
TZAT-0020SLOWVT: 1.5 ms
TZAT-0020SLOWVT: 1.5 ms
TZON-0003ATACH: 350 ms
TZON-0003SLOWVT: 350 ms
TZON-0003VSLOWVT: 430 ms
TZON-0004SLOWVT: 16
TZON-0004VSLOWVT: 20
TZST-0001ATACH: 4
TZST-0001ATACH: 6
TZST-0001FASTVT: 2
TZST-0001FASTVT: 6
TZST-0001SLOWVT: 3
TZST-0001SLOWVT: 5
TZST-0002FASTVT: NEGATIVE
TZST-0002FASTVT: NEGATIVE
TZST-0002FASTVT: NEGATIVE
TZST-0003SLOWVT: 35 J
VENTRICULAR PACING ICD: 99.94 pct
VF: 0

## 2011-12-31 NOTE — Progress Notes (Signed)
ICD remote with ICM 

## 2012-01-05 ENCOUNTER — Encounter: Payer: Self-pay | Admitting: *Deleted

## 2012-01-25 ENCOUNTER — Encounter: Payer: Self-pay | Admitting: Internal Medicine

## 2012-02-28 ENCOUNTER — Telehealth: Payer: Self-pay | Admitting: *Deleted

## 2012-02-28 NOTE — Telephone Encounter (Signed)
(  pt aware you are out of the office) Pt has annual 03/03/12 and called today for recommendations for vaginal dryness. Pt c/o pain during sex, pt tried [place] gel but it cause her to have some vaginal burning. Can pt have samples at office hyalo gyn vaginal hydrating gel or vagifem or estrace cream? Please advise

## 2012-02-28 NOTE — Telephone Encounter (Signed)
If she has NOT been diagnosed with breast cancer since I last saw her give her Estrace cream 1 gram at bedtime in vagina daily for 2 weeks then three times a week.

## 2012-02-29 ENCOUNTER — Encounter: Payer: Self-pay | Admitting: Gynecology

## 2012-02-29 DIAGNOSIS — J45909 Unspecified asthma, uncomplicated: Secondary | ICD-10-CM | POA: Insufficient documentation

## 2012-02-29 DIAGNOSIS — F419 Anxiety disorder, unspecified: Secondary | ICD-10-CM | POA: Insufficient documentation

## 2012-02-29 DIAGNOSIS — N879 Dysplasia of cervix uteri, unspecified: Secondary | ICD-10-CM | POA: Insufficient documentation

## 2012-02-29 DIAGNOSIS — D219 Benign neoplasm of connective and other soft tissue, unspecified: Secondary | ICD-10-CM | POA: Insufficient documentation

## 2012-02-29 DIAGNOSIS — F329 Major depressive disorder, single episode, unspecified: Secondary | ICD-10-CM | POA: Insufficient documentation

## 2012-02-29 DIAGNOSIS — I341 Nonrheumatic mitral (valve) prolapse: Secondary | ICD-10-CM | POA: Insufficient documentation

## 2012-02-29 DIAGNOSIS — K449 Diaphragmatic hernia without obstruction or gangrene: Secondary | ICD-10-CM | POA: Insufficient documentation

## 2012-02-29 NOTE — Telephone Encounter (Signed)
Pt has been diag. With breast cancer and she will take samples of the below.

## 2012-03-03 ENCOUNTER — Other Ambulatory Visit (HOSPITAL_COMMUNITY)
Admission: RE | Admit: 2012-03-03 | Discharge: 2012-03-03 | Disposition: A | Discharge status: Home / Self Care | Payer: 59 | Source: Ambulatory Visit | Attending: Obstetrics and Gynecology | Admitting: Obstetrics and Gynecology

## 2012-03-03 ENCOUNTER — Ambulatory Visit (INDEPENDENT_AMBULATORY_CARE_PROVIDER_SITE_OTHER): Payer: 59 | Admitting: Obstetrics and Gynecology

## 2012-03-03 ENCOUNTER — Encounter: Payer: Self-pay | Admitting: Obstetrics and Gynecology

## 2012-03-03 VITALS — BP 124/78 | Ht 65.0 in | Wt 152.0 lb

## 2012-03-03 DIAGNOSIS — Z01419 Encounter for gynecological examination (general) (routine) without abnormal findings: Secondary | ICD-10-CM

## 2012-03-03 DIAGNOSIS — N289 Disorder of kidney and ureter, unspecified: Secondary | ICD-10-CM | POA: Insufficient documentation

## 2012-03-03 DIAGNOSIS — Z8619 Personal history of other infectious and parasitic diseases: Secondary | ICD-10-CM | POA: Insufficient documentation

## 2012-03-03 MED ORDER — ESTROGENS, CONJUGATED 0.625 MG/GM VA CREA: TOPICAL_CREAM | Freq: Every day | VAGINAL | Status: DC

## 2012-03-03 NOTE — Progress Notes (Signed)
Patient came to see me a day for her annual GYN exam. She is doing well in terms of menopausal symptoms with the exception of vaginal dryness and dyspareunia which has occurred over the past year. Initially she did well with a vaginal lubricant but is not working now and actually is irritating her. She is a little over due for a mammogram. She's had 2 normal bone densities. She does her lab through her PCP. She is having no vaginal bleeding. She is having no pelvic pain.  HEENT: Within normal limits. [name] present. Neck: No masses. Supraclavicular lymph nodes: Not enlarged. Breasts: Examined in both sitting and lying position. Symmetrical without skin changes or masses. Abdomen: Soft no masses guarding or rebound. No hernias. Pelvic: External within normal limits. BUS within normal limits. Vaginal examination shows poor estrogen effect, no cystocele enterocele or rectocele. Cervix and uterus absent. Adnexa within normal limits. Rectovaginal confirmatory. Extremities within normal limits.  Assessment: Atrophic vaginitis. Cervical dysplasia.  Plan: Schedule mammogram. Premarin vaginal cream 0.5 gram in vagina at bedtime 4 times a week for 2 weeks and then  3 times a week. Samples and prescription given.

## 2012-03-04 LAB — URINALYSIS W MICROSCOPIC + REFLEX CULTURE
Bilirubin Urine: NEGATIVE
Crystals: NONE SEEN
Glucose, UA: NEGATIVE mg/dL
Specific Gravity, Urine: 1.005 (ref 1.005–1.030)
Squamous Epithelial / LPF: NONE SEEN
Urobilinogen, UA: 0.2 mg/dL (ref 0.0–1.0)

## 2012-03-07 ENCOUNTER — Telehealth: Payer: Self-pay | Admitting: *Deleted

## 2012-03-07 MED ORDER — ESTROGENS, CONJUGATED 0.625 MG/GM VA CREA: TOPICAL_CREAM | VAGINAL | Status: AC

## 2012-03-07 NOTE — Telephone Encounter (Signed)
Pharmacy called to clarify rx for premarin 42.5 gram should be 3 times per week, rx resent with correct directions.

## 2012-03-23 ENCOUNTER — Encounter: Payer: Self-pay | Admitting: Internal Medicine

## 2012-03-23 ENCOUNTER — Ambulatory Visit (INDEPENDENT_AMBULATORY_CARE_PROVIDER_SITE_OTHER): Payer: 59 | Admitting: *Deleted

## 2012-03-23 DIAGNOSIS — I5022 Chronic systolic (congestive) heart failure: Secondary | ICD-10-CM

## 2012-03-23 DIAGNOSIS — Z95810 Presence of automatic (implantable) cardiac defibrillator: Secondary | ICD-10-CM

## 2012-03-28 ENCOUNTER — Other Ambulatory Visit: Payer: Self-pay | Admitting: Obstetrics and Gynecology

## 2012-03-28 DIAGNOSIS — Z1231 Encounter for screening mammogram for malignant neoplasm of breast: Secondary | ICD-10-CM

## 2012-03-31 LAB — REMOTE ICD DEVICE
AL AMPLITUDE: 2.7 mv
BAMS-0001: 170 {beats}/min
BATTERY VOLTAGE: 3.04 V
BRDY-0002LV: 50 {beats}/min
BRDY-0003LV: 130 {beats}/min
FVT: 0
LV LEAD THRESHOLD: 1.5 V
RV LEAD IMPEDENCE ICD: 480 Ohm
TZAT-0001ATACH: 1
TZAT-0001ATACH: 2
TZAT-0001ATACH: 3
TZAT-0001FASTVT: 1
TZAT-0001SLOWVT: 1
TZAT-0001SLOWVT: 2
TZAT-0002ATACH: NEGATIVE
TZAT-0002ATACH: NEGATIVE
TZAT-0002FASTVT: NEGATIVE
TZAT-0004SLOWVT: 8
TZAT-0005SLOWVT: 88 pct
TZAT-0005SLOWVT: 91 pct
TZAT-0012ATACH: 150 ms
TZAT-0012ATACH: 150 ms
TZAT-0012SLOWVT: 200 ms
TZAT-0012SLOWVT: 200 ms
TZAT-0013SLOWVT: 2
TZAT-0013SLOWVT: 2
TZAT-0018ATACH: NEGATIVE
TZAT-0018ATACH: NEGATIVE
TZAT-0018ATACH: NEGATIVE
TZAT-0018SLOWVT: NEGATIVE
TZAT-0018SLOWVT: NEGATIVE
TZAT-0019ATACH: 6 V
TZAT-0019ATACH: 6 V
TZON-0003ATACH: 350 ms
TZON-0003SLOWVT: 350 ms
TZON-0004SLOWVT: 16
TZON-0004VSLOWVT: 20
TZON-0005SLOWVT: 12
TZST-0001ATACH: 4
TZST-0001ATACH: 5
TZST-0001ATACH: 6
TZST-0001FASTVT: 2
TZST-0001FASTVT: 3
TZST-0001FASTVT: 4
TZST-0001SLOWVT: 3
TZST-0001SLOWVT: 4
TZST-0001SLOWVT: 6
TZST-0002ATACH: NEGATIVE
TZST-0002FASTVT: NEGATIVE
TZST-0002FASTVT: NEGATIVE
TZST-0002FASTVT: NEGATIVE
TZST-0003SLOWVT: 35 J
TZST-0003SLOWVT: 35 J
VF: 0

## 2012-04-10 ENCOUNTER — Encounter: Payer: Self-pay | Admitting: *Deleted

## 2012-04-13 ENCOUNTER — Ambulatory Visit: Payer: 59

## 2012-04-14 ENCOUNTER — Ambulatory Visit (INDEPENDENT_AMBULATORY_CARE_PROVIDER_SITE_OTHER): Payer: 59 | Admitting: *Deleted

## 2012-04-14 ENCOUNTER — Ambulatory Visit
Admission: RE | Admit: 2012-04-14 | Discharge: 2012-04-14 | Disposition: A | Discharge status: Home / Self Care | Payer: 59 | Source: Ambulatory Visit | Attending: Obstetrics and Gynecology | Admitting: Obstetrics and Gynecology

## 2012-04-14 ENCOUNTER — Encounter: Payer: Self-pay | Admitting: Internal Medicine

## 2012-04-14 DIAGNOSIS — I471 Supraventricular tachycardia, unspecified: Secondary | ICD-10-CM

## 2012-04-14 DIAGNOSIS — Z1231 Encounter for screening mammogram for malignant neoplasm of breast: Secondary | ICD-10-CM

## 2012-04-14 DIAGNOSIS — I5022 Chronic systolic (congestive) heart failure: Secondary | ICD-10-CM

## 2012-04-14 LAB — ICD DEVICE OBSERVATION
ATRIAL PACING ICD: 0 pct
BAMS-0001: 170 {beats}/min
BRDY-0002RV: 50 {beats}/min
CHARGE TIME: 10.2 s
RV LEAD THRESHOLD: 1.5 V
TZAT-0001ATACH: 1
TZAT-0002ATACH: NEGATIVE
TZAT-0002ATACH: NEGATIVE
TZAT-0005SLOWVT: 88 pct
TZAT-0005SLOWVT: 91 pct
TZAT-0011SLOWVT: 10 ms
TZAT-0011SLOWVT: 10 ms
TZAT-0018ATACH: NEGATIVE
TZAT-0018SLOWVT: NEGATIVE
TZAT-0018SLOWVT: NEGATIVE
TZAT-0019ATACH: 6 V
TZAT-0019ATACH: 6 V
TZAT-0019FASTVT: 8 V
TZAT-0020ATACH: 1.5 ms
TZAT-0020FASTVT: 1.5 ms
TZON-0003SLOWVT: 350 ms
TZON-0005SLOWVT: 12
TZST-0001ATACH: 5
TZST-0001FASTVT: 2
TZST-0001FASTVT: 3
TZST-0001FASTVT: 5
TZST-0001SLOWVT: 4
TZST-0001SLOWVT: 6
TZST-0002ATACH: NEGATIVE
TZST-0002FASTVT: NEGATIVE
TZST-0002FASTVT: NEGATIVE
TZST-0003SLOWVT: 35 J
TZST-0003SLOWVT: 35 J
VENTRICULAR PACING ICD: 100 pct

## 2012-04-14 NOTE — Progress Notes (Signed)
ICD interrogation with ICM

## 2012-04-18 ENCOUNTER — Ambulatory Visit: Payer: 59

## 2012-07-26 ENCOUNTER — Encounter: Payer: Self-pay | Admitting: *Deleted

## 2012-08-07 ENCOUNTER — Encounter: Payer: Self-pay | Admitting: Internal Medicine

## 2012-08-07 ENCOUNTER — Ambulatory Visit (INDEPENDENT_AMBULATORY_CARE_PROVIDER_SITE_OTHER): Payer: 59 | Admitting: Internal Medicine

## 2012-08-07 VITALS — BP 114/80 | HR 74 | Ht 65.0 in | Wt 153.8 lb

## 2012-08-07 DIAGNOSIS — Z95810 Presence of automatic (implantable) cardiac defibrillator: Secondary | ICD-10-CM

## 2012-08-07 DIAGNOSIS — I2510 Atherosclerotic heart disease of native coronary artery without angina pectoris: Secondary | ICD-10-CM

## 2012-08-07 DIAGNOSIS — I471 Supraventricular tachycardia: Secondary | ICD-10-CM

## 2012-08-07 DIAGNOSIS — I5022 Chronic systolic (congestive) heart failure: Secondary | ICD-10-CM

## 2012-08-07 LAB — ICD DEVICE OBSERVATION
AL AMPLITUDE: 2.5 mv
ATRIAL PACING ICD: 0.08 pct
BATTERY VOLTAGE: 3 V
CHARGE TIME: 10.38 s
FVT: 0
PACEART VT: 0
TOT-0002: 0
TOT-0006: 20090610000000
TZAT-0001ATACH: 1
TZAT-0001ATACH: 3
TZAT-0001FASTVT: 1
TZAT-0001SLOWVT: 1
TZAT-0001SLOWVT: 2
TZAT-0002ATACH: NEGATIVE
TZAT-0002ATACH: NEGATIVE
TZAT-0002FASTVT: NEGATIVE
TZAT-0005SLOWVT: 88 pct
TZAT-0005SLOWVT: 91 pct
TZAT-0012ATACH: 150 ms
TZAT-0013SLOWVT: 2
TZAT-0013SLOWVT: 2
TZAT-0018ATACH: NEGATIVE
TZAT-0018ATACH: NEGATIVE
TZAT-0018SLOWVT: NEGATIVE
TZAT-0018SLOWVT: NEGATIVE
TZAT-0019ATACH: 6 V
TZAT-0019ATACH: 6 V
TZON-0004SLOWVT: 32
TZON-0005SLOWVT: 12
TZST-0001ATACH: 4
TZST-0001ATACH: 5
TZST-0001ATACH: 6
TZST-0001FASTVT: 3
TZST-0001FASTVT: 4
TZST-0001SLOWVT: 3
TZST-0001SLOWVT: 4
TZST-0001SLOWVT: 6
TZST-0002ATACH: NEGATIVE
TZST-0002ATACH: NEGATIVE
TZST-0002FASTVT: NEGATIVE
TZST-0002FASTVT: NEGATIVE
TZST-0002FASTVT: NEGATIVE
TZST-0002FASTVT: NEGATIVE
TZST-0003SLOWVT: 35 J
TZST-0003SLOWVT: 35 J
VENTRICULAR PACING ICD: 99.98 pct
VF: 0

## 2012-08-07 NOTE — Assessment & Plan Note (Signed)
She denies anginal symptoms. She is very anxious about having recurrent chest discomfort and states that this is why she is not exercising. I've encouraged her to start walking.

## 2012-08-07 NOTE — Progress Notes (Signed)
HPI [name] returns today for followup. She is a very pleasant 61 year old woman with an ischemic cardiomyopathy, chronic systolic heart failure, status post biventricular ICD implantation. In the interim, the patient has been stable. She denies chest pain or shortness of breath. She is not exercising. She has been bothered by headaches. She admits to a sedentary lifestyle. She denies sodium indiscretion. Allergies  Allergen Reactions  . Codeine   . Digoxin And Related   . Diltiazem   . Latex     Latex tape  . Metolazone   . Morphine Sulfate   . Penicillins   . Septra Ds (Sulfamethoxazole W/Trimethoprim (Co-Trimoxazole))   . Spironolactone   . Sulfa Drugs Cross Reactors   . Taztia Xt (Diltiazem Hcl)   . Tetracyclines & Related   . Ticlid (Ticlopidine Hcl)   . Verapamil   . Vicodin (Hydrocodone-Acetaminophen)      Current Outpatient Prescriptions  Medication Sig Dispense Refill  . Albuterol (PROVENTIL IN) Inhale into the lungs. 2 puffs every 4 hours       . ALPRAZolam (XANAX) 1 MG tablet Take 1 mg by mouth. As directed       . Artificial Tear GEL Apply to eye. 2 drops as needed       . aspirin 325 MG EC tablet Take 325 mg by mouth daily.        . clopidogrel (PLAVIX) 75 MG tablet 1 tab daily      . conjugated estrogens (PREMARIN) vaginal cream Place vaginally 3 (three) times a week. 0.5 grams in vagina 3 times a week  42.5 g  12  . fluticasone (FLONASE) 50 MCG/ACT nasal spray Place 2 sprays into the nose daily.        . furosemide (LASIX) 80 MG tablet 2 (two) times daily. 1 tab daily      . metFORMIN (GLUCOPHAGE) 1000 MG tablet 850 mg daily with breakfast. 1 tab daily      . metoprolol (LOPRESSOR) 100 MG tablet 1 tab twice a day      . nitroGLYCERIN (NITROSTAT) 0.4 MG SL tablet Place 0.4 mg under the tongue every 5 (five) minutes as needed.        . NON FORMULARY Loratadine / or zyrtec 1 daily in the am       . omeprazole (PRILOSEC) 40 MG capsule 1 tab daily      .  ondansetron (ZOFRAN) 8 MG tablet Take by mouth every 8 (eight) hours as needed.      [name] oxybutynin (DITROPAN-XL) 10 MG 24 hr tablet 1 tab daily prn      . pediatric multivitamin-iron (POLY-VI-SOL WITH IRON) solution Take 1 mL by mouth daily.        . potassium chloride SA (K-DUR,KLOR-CON) 20 MEQ tablet 1 tab twice a day      . ramipril (ALTACE) 10 MG capsule Take 10 mg by mouth 2 (two) times daily.       . simvastatin (ZOCOR) 20 MG tablet 1 tab daily      . sodium chloride (MURO 128) 5 % ophthalmic ointment 1 drop.        . traMADol (ULTRAM) 50 MG tablet Take 50 mg by mouth every 6 (six) hours as needed.      . venlafaxine (EFFEXOR) 100 MG tablet 2 tabs am 1 tab in pm      . ZETIA 10 MG tablet 1 tab daily         Past Medical History  Diagnosis  Date  . Ischemic cardiomyopathy     status post biventricular ICD placed by DR Edumunds who used to see Dr Melvern Banker here to establish  cardiovascular care.  . Diabetes mellitus   . Hypertension   . Coronary artery disease   . CHF (congestive heart failure)   . Stroke   . MVP (mitral valve prolapse)     Antibiotics not required for procedures  . Asthma   . Anxiety   . Depression   . Hiatal hernia   . Cervical dysplasia   . Fibroid   . Function kidney decreased   . History of shingles     ROS:   All systems reviewed and negative except as noted in the HPI.   Past Surgical History  Procedure Date  . Insert / replace / remove pacemaker     biventricular defibrillator--06/10/ 2009  . Abdominal hysterectomy     TAH   . Cardiac defibrillator placement   . Cardiac bypass surg     Triple  . Breast surgery     Breast Bx-benign_Left breast lump removed  . Cardiac catheterization   . Colposcopy      Family History  Problem Relation Age of Onset  . Heart disease Mother   . Hypertension Mother   . Heart disease Father   . Hypertension Father   . Diabetes Father   . Kidney failure Father   . Heart disease Brother   . Diabetes  Brother   . Kidney failure Brother   . Diabetes Paternal Grandmother      History   Social History  . Marital Status: Married    Spouse Name: N/A    Number of Children: N/A  . Years of Education: N/A   Occupational History  . Not on file.   Social History Main Topics  . Smoking status: Never Smoker   . Smokeless tobacco: Not on file  . Alcohol Use: Yes     rare  . Drug Use: Not on file  . Sexually Active: Yes    Birth Control/ Protection: Surgical   Other Topics Concern  . Not on file   Social History Narrative  . No narrative on file     BP 114/80  Pulse 74  Ht 5\' 5"  (1.651 m)  Wt 153 lb 12.8 oz (69.763 kg)  BMI 25.59 kg/m2  SpO2 98%  Physical Exam:  Well appearing middle-aged woman, NAD HEENT: Unremarkable Neck:  No JVD, no thyromegally Lungs:  Clear with no wheezes, rales, or rhonchi. HEART:  Regular rate rhythm, no murmurs, no rubs, no clicks Abd:  soft, positive bowel sounds, no organomegally, no rebound, no guarding Ext:  2 plus pulses, no edema, no cyanosis, no clubbing Skin:  No rashes no nodules Neuro:  CN II through XII intact, motor grossly intact  EKG Normal sinus rhythm with fusion beats.  DEVICE  Normal device function.  See PaceArt for details.   Assess/Plan:

## 2012-08-07 NOTE — Assessment & Plan Note (Signed)
Her [employer] biventricular ICD is working normally. We'll plan to recheck in several months.

## 2012-08-07 NOTE — Patient Instructions (Addendum)
Your physician wants you to follow-up in: [place] DR. Knox Saliva will receive a reminder letter in the mail two months in advance. If you don't receive a letter, please call our office to schedule the follow-up appointment.   Your physician recommends that you continue on your current medications as directed. Please refer to the Current Medication list given to you today.

## 2012-08-07 NOTE — Assessment & Plan Note (Signed)
Interrogation of her pacemaker demonstrates no recurrent supraventricular arrhythmias. She will undergo watchful waiting.

## 2012-08-07 NOTE — Assessment & Plan Note (Signed)
Her symptoms appear to be class 1-2. I've encouraged her to continue her current medications, maintain a low-sodium diet, and start exercising on a regular basis.

## 2012-08-23 ENCOUNTER — Encounter: Payer: Self-pay | Admitting: Internal Medicine

## 2012-11-11 ENCOUNTER — Emergency Department (HOSPITAL_COMMUNITY): Payer: 59

## 2012-11-11 ENCOUNTER — Inpatient Hospital Stay (HOSPITAL_COMMUNITY)
Admission: EM | Admit: 2012-11-11 | Discharge: 2012-11-14 | DRG: 065 | Disposition: A | Discharge status: Home / Self Care | Payer: 59 | Attending: Internal Medicine | Admitting: Internal Medicine

## 2012-11-11 DIAGNOSIS — G8190 Hemiplegia, unspecified affecting unspecified side: Secondary | ICD-10-CM | POA: Diagnosis present

## 2012-11-11 DIAGNOSIS — Z79899 Other long term (current) drug therapy: Secondary | ICD-10-CM

## 2012-11-11 DIAGNOSIS — I25709 Atherosclerosis of coronary artery bypass graft(s), unspecified, with unspecified angina pectoris: Secondary | ICD-10-CM | POA: Diagnosis present

## 2012-11-11 DIAGNOSIS — Z95810 Presence of automatic (implantable) cardiac defibrillator: Secondary | ICD-10-CM

## 2012-11-11 DIAGNOSIS — I428 Other cardiomyopathies: Secondary | ICD-10-CM | POA: Diagnosis present

## 2012-11-11 DIAGNOSIS — I2589 Other forms of chronic ischemic heart disease: Secondary | ICD-10-CM | POA: Diagnosis present

## 2012-11-11 DIAGNOSIS — F419 Anxiety disorder, unspecified: Secondary | ICD-10-CM

## 2012-11-11 DIAGNOSIS — I129 Hypertensive chronic kidney disease with stage 1 through stage 4 chronic kidney disease, or unspecified chronic kidney disease: Secondary | ICD-10-CM | POA: Diagnosis present

## 2012-11-11 DIAGNOSIS — N189 Chronic kidney disease, unspecified: Secondary | ICD-10-CM | POA: Diagnosis present

## 2012-11-11 DIAGNOSIS — E119 Type 2 diabetes mellitus without complications: Secondary | ICD-10-CM

## 2012-11-11 DIAGNOSIS — G459 Transient cerebral ischemic attack, unspecified: Secondary | ICD-10-CM

## 2012-11-11 DIAGNOSIS — F329 Major depressive disorder, single episode, unspecified: Secondary | ICD-10-CM | POA: Diagnosis present

## 2012-11-11 DIAGNOSIS — I059 Rheumatic mitral valve disease, unspecified: Secondary | ICD-10-CM | POA: Diagnosis present

## 2012-11-11 DIAGNOSIS — F411 Generalized anxiety disorder: Secondary | ICD-10-CM | POA: Diagnosis present

## 2012-11-11 DIAGNOSIS — F3289 Other specified depressive episodes: Secondary | ICD-10-CM | POA: Diagnosis present

## 2012-11-11 DIAGNOSIS — I471 Supraventricular tachycardia, unspecified: Secondary | ICD-10-CM | POA: Diagnosis present

## 2012-11-11 DIAGNOSIS — I2510 Atherosclerotic heart disease of native coronary artery without angina pectoris: Secondary | ICD-10-CM

## 2012-11-11 DIAGNOSIS — N183 Chronic kidney disease, stage 3 unspecified: Secondary | ICD-10-CM

## 2012-11-11 DIAGNOSIS — I639 Cerebral infarction, unspecified: Secondary | ICD-10-CM

## 2012-11-11 DIAGNOSIS — I509 Heart failure, unspecified: Secondary | ICD-10-CM | POA: Diagnosis present

## 2012-11-11 DIAGNOSIS — I4891 Unspecified atrial fibrillation: Secondary | ICD-10-CM | POA: Diagnosis present

## 2012-11-11 DIAGNOSIS — I10 Essential (primary) hypertension: Secondary | ICD-10-CM | POA: Diagnosis present

## 2012-11-11 DIAGNOSIS — I482 Chronic atrial fibrillation, unspecified: Secondary | ICD-10-CM

## 2012-11-11 DIAGNOSIS — I5022 Chronic systolic (congestive) heart failure: Secondary | ICD-10-CM | POA: Diagnosis present

## 2012-11-11 DIAGNOSIS — I6350 Cerebral infarction due to unspecified occlusion or stenosis of unspecified cerebral artery: Principal | ICD-10-CM

## 2012-11-11 DIAGNOSIS — J45909 Unspecified asthma, uncomplicated: Secondary | ICD-10-CM | POA: Diagnosis present

## 2012-11-11 DIAGNOSIS — F32A Depression, unspecified: Secondary | ICD-10-CM | POA: Diagnosis present

## 2012-11-11 DIAGNOSIS — Z7982 Long term (current) use of aspirin: Secondary | ICD-10-CM

## 2012-11-11 DIAGNOSIS — K449 Diaphragmatic hernia without obstruction or gangrene: Secondary | ICD-10-CM | POA: Diagnosis present

## 2012-11-11 DIAGNOSIS — N2889 Other specified disorders of kidney and ureter: Secondary | ICD-10-CM | POA: Diagnosis present

## 2012-11-11 HISTORY — DX: Type 2 diabetes mellitus without complications: E11.9

## 2012-11-11 HISTORY — DX: Transient cerebral ischemic attack, unspecified: G45.9

## 2012-11-11 HISTORY — DX: Hemiplegia, unspecified affecting unspecified side: G81.90

## 2012-11-11 HISTORY — DX: Cerebral infarction, unspecified: I63.9

## 2012-11-11 HISTORY — DX: Chronic kidney disease, stage 3 unspecified: N18.30

## 2012-11-11 LAB — TROPONIN I: Troponin I: <0.3 ng/mL (ref <0.30)

## 2012-11-11 LAB — CBC
Hemoglobin: 10.6 g/dL — ABNORMAL LOW (ref 12.0–15.0)
Hemoglobin: 10.6 g/dL — ABNORMAL LOW (ref 12.0–15.0)
MCH: 28.4 pg (ref 26.0–34.0)
MCH: 29.9 pg (ref 26.0–34.0)
MCV: 87.1 fL (ref 78.0–100.0)
Platelets: 219 10*3/uL (ref 150–400)
RBC: 3.73 MIL/uL — ABNORMAL LOW (ref 3.87–5.11)
RBC: 3.55 MIL/uL — ABNORMAL LOW (ref 3.87–5.11)
WBC: 6.3 10*3/uL (ref 4.0–10.5)
WBC: 6.5 10*3/uL (ref 4.0–10.5)

## 2012-11-11 LAB — GLUCOSE, CAPILLARY
Glucose-Capillary: 126 mg/dL — ABNORMAL HIGH (ref 70–99)
Glucose-Capillary: 153 mg/dL — ABNORMAL HIGH (ref 70–99)

## 2012-11-11 LAB — COMPREHENSIVE METABOLIC PANEL
ALT: 22 U/L (ref 0–35)
Alkaline Phosphatase: 62 U/L (ref 39–117)
BUN: 13 mg/dL (ref 6–23)
CO2: 30 mEq/L (ref 19–32)
GFR calc Af Amer: 36 mL/min — ABNORMAL LOW (ref >90)
GFR calc non Af Amer: 31 mL/min — ABNORMAL LOW (ref >90)
Glucose, Bld: 136 mg/dL — ABNORMAL HIGH (ref 70–99)
Potassium: 3.7 mEq/L (ref 3.5–5.1)
Sodium: 140 mEq/L (ref 135–145)
Total Bilirubin: 0.5 mg/dL (ref 0.3–1.2)

## 2012-11-11 LAB — DIFFERENTIAL
Eosinophils Relative: 1 % (ref 0–5)
Lymphocytes Relative: 44 % (ref 12–46)
Lymphs Abs: 2.8 10*3/uL (ref 0.7–4.0)
Monocytes Relative: 7 % (ref 3–12)
Neutrophils Relative %: 47 % (ref 43–77)

## 2012-11-11 LAB — POCT I-STAT, CHEM 8
Calcium, Ion: 1.11 mmol/L — ABNORMAL LOW (ref 1.12–1.23)
Glucose, Bld: 131 mg/dL — ABNORMAL HIGH (ref 70–99)
HCT: 35 % — ABNORMAL LOW (ref 36.0–46.0)
Hemoglobin: 11.9 g/dL — ABNORMAL LOW (ref 12.0–15.0)
TCO2: 31 mmol/L (ref 0–100)

## 2012-11-11 LAB — CREATININE, SERUM
Creatinine, Ser: 1.55 mg/dL — ABNORMAL HIGH (ref 0.50–1.10)
GFR calc Af Amer: 41 mL/min — ABNORMAL LOW (ref >90)

## 2012-11-11 LAB — PROTIME-INR: Prothrombin Time: 12.8 seconds (ref 11.6–15.2)

## 2012-11-11 MED ORDER — INSULIN ASPART 100 UNIT/ML ~~LOC~~ SOLN
0.0000 [IU] | Freq: Three times a day (TID) | SUBCUTANEOUS | Status: DC
  Administered 2012-11-12: 1 [IU] via SUBCUTANEOUS

## 2012-11-11 MED ORDER — ACETAMINOPHEN 650 MG RE SUPP: 650.0000 mg | RECTAL | Status: DC | PRN

## 2012-11-11 MED ORDER — ONDANSETRON HCL 4 MG/2ML IJ SOLN: 4.0000 mg | Freq: Four times a day (QID) | INTRAMUSCULAR | Status: DC | PRN

## 2012-11-11 MED ORDER — HEPARIN SODIUM (PORCINE) 5000 UNIT/ML IJ SOLN
5000.0000 [IU] | Freq: Three times a day (TID) | INTRAMUSCULAR | Status: DC
  Administered 2012-11-11 – 2012-11-14 (×9): 5000 [IU] via SUBCUTANEOUS
  Filled 2012-11-11 (×11): qty 1

## 2012-11-11 MED ORDER — RAMIPRIL 10 MG PO CAPS
10.0000 mg | ORAL_CAPSULE | Freq: Two times a day (BID) | ORAL | Status: DC
  Administered 2012-11-11 – 2012-11-14 (×6): 10 mg via ORAL
  Filled 2012-11-11 (×7): qty 1

## 2012-11-11 MED ORDER — SENNOSIDES-DOCUSATE SODIUM 8.6-50 MG PO TABS: 1.0000 | ORAL_TABLET | Freq: Every evening | ORAL | Status: DC | PRN

## 2012-11-11 MED ORDER — SODIUM CHLORIDE 0.9 % IV BOLUS (SEPSIS)
1000.0000 mL | Freq: Once | INTRAVENOUS | Status: AC
  Administered 2012-11-11: 1000 mL via INTRAVENOUS

## 2012-11-11 MED ORDER — ASPIRIN 300 MG RE SUPP
300.0000 mg | Freq: Every day | RECTAL | Status: DC
  Filled 2012-11-11 (×3): qty 1

## 2012-11-11 MED ORDER — POTASSIUM CHLORIDE CRYS ER 20 MEQ PO TBCR
20.0000 meq | EXTENDED_RELEASE_TABLET | Freq: Two times a day (BID) | ORAL | Status: DC
  Administered 2012-11-11 – 2012-11-14 (×6): 20 meq via ORAL
  Filled 2012-11-11 (×7): qty 1

## 2012-11-11 MED ORDER — EZETIMIBE 10 MG PO TABS
10.0000 mg | ORAL_TABLET | Freq: Every day | ORAL | Status: DC
  Administered 2012-11-11 – 2012-11-14 (×4): 10 mg via ORAL
  Filled 2012-11-11 (×4): qty 1

## 2012-11-11 MED ORDER — VENLAFAXINE HCL 50 MG PO TABS
200.0000 mg | ORAL_TABLET | Freq: Every morning | ORAL | Status: DC
  Administered 2012-11-12 – 2012-11-14 (×3): 200 mg via ORAL
  Filled 2012-11-11 (×3): qty 4

## 2012-11-11 MED ORDER — TRAMADOL HCL 50 MG PO TABS
50.0000 mg | ORAL_TABLET | Freq: Four times a day (QID) | ORAL | Status: DC | PRN
  Administered 2012-11-12 – 2012-11-14 (×4): 50 mg via ORAL
  Filled 2012-11-11 (×4): qty 1

## 2012-11-11 MED ORDER — ISOSORBIDE MONONITRATE ER 30 MG PO TB24
30.0000 mg | ORAL_TABLET | Freq: Every day | ORAL | Status: DC
  Administered 2012-11-11 – 2012-11-14 (×4): 30 mg via ORAL
  Filled 2012-11-11 (×4): qty 1

## 2012-11-11 MED ORDER — FLUTICASONE PROPIONATE 50 MCG/ACT NA SUSP
2.0000 | Freq: Every day | NASAL | Status: DC
  Administered 2012-11-11 – 2012-11-13 (×2): 2 via NASAL
  Filled 2012-11-11 (×2): qty 16

## 2012-11-11 MED ORDER — OXYBUTYNIN CHLORIDE ER 10 MG PO TB24
10.0000 mg | ORAL_TABLET | Freq: Every day | ORAL | Status: DC
  Administered 2012-11-11 – 2012-11-13 (×3): 10 mg via ORAL
  Filled 2012-11-11 (×4): qty 1

## 2012-11-11 MED ORDER — IOHEXOL 350 MG/ML SOLN
50.0000 mL | Freq: Once | INTRAVENOUS | Status: AC | PRN
  Administered 2012-11-11: 50 mL via INTRAVENOUS

## 2012-11-11 MED ORDER — SODIUM CHLORIDE 0.9 % IV SOLN
INTRAVENOUS | Status: AC
  Administered 2012-11-11: 22:00:00 via INTRAVENOUS

## 2012-11-11 MED ORDER — ADULT MULTIVITAMIN W/MINERALS CH
1.0000 | ORAL_TABLET | Freq: Every day | ORAL | Status: DC
  Administered 2012-11-11 – 2012-11-14 (×4): 1 via ORAL
  Filled 2012-11-11 (×4): qty 1

## 2012-11-11 MED ORDER — DIPHENHYDRAMINE HCL 50 MG/ML IJ SOLN
25.0000 mg | Freq: Once | INTRAMUSCULAR | Status: AC
  Administered 2012-11-11: 25 mg via INTRAVENOUS
  Filled 2012-11-11: qty 1

## 2012-11-11 MED ORDER — FUROSEMIDE 80 MG PO TABS
80.0000 mg | ORAL_TABLET | Freq: Two times a day (BID) | ORAL | Status: DC
  Administered 2012-11-11 – 2012-11-14 (×6): 80 mg via ORAL
  Filled 2012-11-11 (×7): qty 1

## 2012-11-11 MED ORDER — NITROGLYCERIN 0.4 MG SL SUBL
0.4000 mg | SUBLINGUAL_TABLET | SUBLINGUAL | Status: DC | PRN
  Administered 2012-11-11: 0.4 mg via SUBLINGUAL

## 2012-11-11 MED ORDER — SIMVASTATIN 20 MG PO TABS
20.0000 mg | ORAL_TABLET | Freq: Every day | ORAL | Status: DC
  Administered 2012-11-11 – 2012-11-13 (×3): 20 mg via ORAL
  Filled 2012-11-11 (×4): qty 1

## 2012-11-11 MED ORDER — SODIUM CHLORIDE 0.9 % IV SOLN
Freq: Once | INTRAVENOUS | Status: AC
  Administered 2012-11-11: 18:00:00 via INTRAVENOUS

## 2012-11-11 MED ORDER — CLOPIDOGREL BISULFATE 75 MG PO TABS
75.0000 mg | ORAL_TABLET | Freq: Every day | ORAL | Status: DC
  Administered 2012-11-11 – 2012-11-14 (×4): 75 mg via ORAL
  Filled 2012-11-11 (×4): qty 1

## 2012-11-11 MED ORDER — PANTOPRAZOLE SODIUM 40 MG PO TBEC
40.0000 mg | DELAYED_RELEASE_TABLET | Freq: Every day | ORAL | Status: DC
  Administered 2012-11-11 – 2012-11-13 (×3): 40 mg via ORAL
  Filled 2012-11-11 (×3): qty 1

## 2012-11-11 MED ORDER — LORATADINE 10 MG PO TABS
10.0000 mg | ORAL_TABLET | Freq: Every day | ORAL | Status: DC
  Administered 2012-11-11 – 2012-11-14 (×4): 10 mg via ORAL
  Filled 2012-11-11 (×4): qty 1

## 2012-11-11 MED ORDER — ASPIRIN 325 MG PO TABS
325.0000 mg | ORAL_TABLET | Freq: Every day | ORAL | Status: DC
  Administered 2012-11-11 – 2012-11-14 (×4): 325 mg via ORAL
  Filled 2012-11-11 (×4): qty 1

## 2012-11-11 MED ORDER — VENLAFAXINE HCL 50 MG PO TABS
100.0000 mg | ORAL_TABLET | Freq: Every day | ORAL | Status: DC
  Administered 2012-11-11 – 2012-11-13 (×3): 100 mg via ORAL
  Filled 2012-11-11 (×4): qty 2

## 2012-11-11 MED ORDER — ACETAMINOPHEN 325 MG PO TABS
650.0000 mg | ORAL_TABLET | ORAL | Status: DC | PRN
  Administered 2012-11-12: 650 mg via ORAL
  Filled 2012-11-11: qty 2

## 2012-11-11 MED ORDER — SODIUM CHLORIDE (HYPERTONIC) 5 % OP OINT: TOPICAL_OINTMENT | Freq: Every day | OPHTHALMIC | Status: DC | PRN

## 2012-11-11 MED ORDER — METOPROLOL TARTRATE 100 MG PO TABS
100.0000 mg | ORAL_TABLET | Freq: Two times a day (BID) | ORAL | Status: DC
  Administered 2012-11-11 – 2012-11-13 (×5): 100 mg via ORAL
  Filled 2012-11-11 (×7): qty 1

## 2012-11-11 MED ORDER — ALPRAZOLAM 0.25 MG PO TABS
0.5000 mg | ORAL_TABLET | Freq: Three times a day (TID) | ORAL | Status: DC
  Administered 2012-11-11 – 2012-11-12 (×2): 0.5 mg via ORAL
  Administered 2012-11-12: 1 mg via ORAL
  Administered 2012-11-12 – 2012-11-13 (×3): 0.5 mg via ORAL
  Administered 2012-11-13 – 2012-11-14 (×2): 1 mg via ORAL
  Filled 2012-11-11: qty 4
  Filled 2012-11-11 (×3): qty 2
  Filled 2012-11-11: qty 4
  Filled 2012-11-11 (×5): qty 2

## 2012-11-11 NOTE — ED Notes (Signed)
Patient complained of left sided chest pain 8/10 achy and 10/10 throbbing headache. Patient also stated taste of contrast dye in mouth.  EDP and Neurologist notified. After EDP checked repeat EKG. Neurologist ordered Benadryl 25mg  IVP.

## 2012-11-11 NOTE — Consult Note (Signed)
Consult    Chief Complaint: Left sided weakness and aphasia HPI: [name] is an 62 y.o. female with a history of a previous CVA. She was last seen normal at 10 am today by her husband. The patient took a nap and awoke with left sided weakness and difficulty with her speech. EMS was called and the patient was brought to the [facility] ED where she had a stat CT of the head. Report pending. The patient is outside the window for TPA ;therefore, a CT angiogram has been ordered to evaluate the patient for other treatment options. NIH scale currently 13.  LSN: 10 am today tPA Given: No - pt outside of the treatment window, rapid resolution of symptoms  Past Medical History  Diagnosis Date  . Ischemic cardiomyopathy     status post biventricular ICD placed by DR Edumunds who used to see Dr Melvern Banker here to establish  cardiovascular care.  . Diabetes mellitus   . Hypertension   . Coronary artery disease   . CHF (congestive heart failure)   . Stroke   . MVP (mitral valve prolapse)     Antibiotics not required for procedures  . Asthma   . Anxiety   . Depression   . Hiatal hernia   . Cervical dysplasia   . Fibroid   . Function kidney decreased   . History of shingles     Past Surgical History  Procedure Date  . Insert / replace / remove pacemaker     biventricular defibrillator--06/10/ 2009  . Abdominal hysterectomy     TAH   . Cardiac defibrillator placement   . Cardiac bypass surg     Triple  . Breast surgery     Breast Bx-benign_Left breast lump removed  . Cardiac catheterization   . Colposcopy     Family History  Problem Relation Age of Onset  . Heart disease Mother   . Hypertension Mother   . Heart disease Father   . Hypertension Father   . Diabetes Father   . Kidney failure Father   . Heart disease Brother   . Diabetes Brother   . Kidney failure Brother   . Diabetes Paternal Grandmother    Social History:  reports that she has never smoked. She does not have any  smokeless tobacco history on file. She reports that she drinks alcohol. Her drug history not on file.  Allergies:  Allergies  Allergen Reactions  . Codeine   . Digoxin And Related   . Diltiazem   . Latex     Latex tape  . Metolazone   . Morphine Sulfate   . Penicillins   . Septra Ds (Sulfamethoxazole W/Trimethoprim (Co-Trimoxazole))   . Spironolactone   . Sulfa Drugs Cross Reactors   . Taztia Xt (Diltiazem Hcl)   . Tetracyclines & Related   . Ticlid (Ticlopidine Hcl)   . Verapamil   . Vicodin (Hydrocodone-Acetaminophen)    Medications: Current outpatient prescriptions:albuterol (PROVENTIL HFA;VENTOLIN HFA) 108 (90 BASE) MCG/ACT inhaler, Inhale 2 puffs into the lungs every 6 (six) hours as needed. For shortness of breath, Disp: , Rfl: ;  ALPRAZolam (XANAX) 1 MG tablet, Take 0.5-1 mg by mouth 3 (three) times daily. Take 1mg  in the morning and 0.5 mg in the afternoon and at night, Disp: , Rfl: ;  Artificial Tear GEL, Apply to eye. 2 drops as needed , Disp: , Rfl:  aspirin 325 MG EC tablet, Take 325 mg by mouth daily.  , Disp: , Rfl: ;  clopidogrel (PLAVIX) 75 MG tablet, Take 75 mg by mouth daily. 1 tab daily, Disp: , Rfl: ;  conjugated estrogens (PREMARIN) vaginal cream, Place vaginally 3 (three) times a week. 0.5 grams in vagina 3 times a week, Disp: 42.5 g, Rfl: 12;  ezetimibe (ZETIA) 10 MG tablet, Take 10 mg by mouth daily., Disp: , Rfl:  fluticasone (FLONASE) 50 MCG/ACT nasal spray, Place 2 sprays into the nose daily.  , Disp: , Rfl: ;  furosemide (LASIX) 80 MG tablet, Take 80 mg by mouth 2 (two) times daily. , Disp: , Rfl: ;  isosorbide mononitrate (IMDUR) 30 MG 24 hr tablet, Take 30 mg by mouth daily., Disp: , Rfl: ;  loratadine (CLARITIN) 10 MG tablet, Take 10 mg by mouth daily., Disp: , Rfl:  metFORMIN (GLUCOPHAGE) 850 MG tablet, Take 850 mg by mouth daily with breakfast., Disp: , Rfl: ;  metoprolol (LOPRESSOR) 100 MG tablet, Take 100 mg by mouth 2 (two) times daily. 1 tab twice a day,  Disp: , Rfl: ;  Multiple Vitamin (MULTIVITAMIN WITH MINERALS) TABS, Take 1 tablet by mouth daily., Disp: , Rfl: ;  nitroGLYCERIN (NITROSTAT) 0.4 MG SL tablet, Place 0.4 mg under the tongue every 5 (five) minutes as needed. For chest pain, Disp: , Rfl:  ondansetron (ZOFRAN) 8 MG tablet, Take 8 mg by mouth every 8 (eight) hours as needed. For nausea, Disp: , Rfl: ;  oxybutynin (DITROPAN-XL) 10 MG 24 hr tablet, Take 10 mg by mouth at bedtime. 1 tab daily prn, Disp: , Rfl: ;  pantoprazole (PROTONIX) 40 MG tablet, Take 40 mg by mouth daily., Disp: , Rfl: ;  potassium chloride SA (K-DUR,KLOR-CON) 20 MEQ tablet, Take 20 mEq by mouth 2 (two) times daily. 1 tab twice a day, Disp: , Rfl:  ramipril (ALTACE) 10 MG capsule, Take 10 mg by mouth 2 (two) times daily. , Disp: , Rfl: ;  simvastatin (ZOCOR) 20 MG tablet, Take 20 mg by mouth at bedtime. 1 tab daily, Disp: , Rfl: ;  sodium chloride (MURO 128) 5 % ophthalmic ointment, Place 1 drop into both eyes daily as needed. For dry eyes, Disp: , Rfl: ;  traMADol (ULTRAM) 50 MG tablet, Take 50 mg by mouth every 6 (six) hours as needed. For pain, Disp: , Rfl:  venlafaxine (EFFEXOR) 100 MG tablet, Take 300 mg by mouth every morning. 2 tabs am 1 tab in pm, Disp: , Rfl:     ROS: History obtained from the patient  General ROS: negative for - chills, fatigue, fever, night sweats, weight gain or weight loss. Recent generalized weakness. Psychological ROS: negative for - behavioral disorder, hallucinations, memory difficulties, mood swings or suicidal ideation Ophthalmic ROS: negative for - blurry vision, double vision, eye pain or loss of vision ENT ROS: negative for - epistaxis, nasal discharge, oral lesions, sore throat, tinnitus or vertigo Allergy and Immunology ROS: negative for - hives or itchy/watery eyes Hematological and Lymphatic ROS: negative for - bleeding problems, bruising or swollen lymph nodes Endocrine ROS: negative for - galactorrhea, hair pattern changes,  polydipsia/polyuria or temperature intolerance Respiratory ROS: negative for - cough, hemoptysis,  Positive for Dyspnea on exertion. Cardiovascular ROS: negative for - , dyspnea on exertion, edema or irregular heartbeat Positive for Recent chest pain Gastrointestinal ROS: negative for - abdominal pain, diarrhea, hematemesis, nausea/vomiting or stool incontinence Positive for constipation Genito-Urinary ROS: negative for - dysuria, hematuria, incontinence or urinary frequency/urgency Musculoskeletal ROS: negative for - joint swelling or muscular weakness Neurological ROS: TIA several  months ago Dermatological ROS: negative for rash and skin lesion changes  Physical Examination: BP-154/87,  HR-68,  RR-18,  SpO2-96%,  T-98.3  General Examination: HEENT-  Normocephalic, no lesions, without obvious abnormality.  Normal external eye and conjunctiva.  Normal TM's bilaterally.  Normal auditory canals and external ears. Normal external nose, mucus membranes and septum.  Normal pharynx. Neck supple with no masses, nodes, nodules or enlargement. Cardiovascular - RRR 2/6 systolic murmer Lungs - Clear anteriorly Abdomen - soft nontender Extremities - Pulses intact trace edema  Neurologic Examination: Mental Status: Not alert but can be aroused.. Oriented to month. Difficult evaluation secondary to aphasia / dysarthria.  Pt attempts to answer questions. Able to follow 1 step commands with prompting. Cranial Nerves: II: Discs unable to visualize.; Visual fields grossly normal, pupils equal, round, reactive to light and accommodation III,IV, VI: ptosis not present, extra-ocular motions intact bilaterally V,VII:Left facial weakness. Decreased sensation on the left. VIII: hearing normal bilaterally IX,X: gag reflex present XI: bilateral shoulder shrug XII: tongue protrudes and deviates to right. Motor: Right : Upper extremity   5/5    Left:     Upper extremity   0/5  Lower extremity   5/5     Lower  extremity   0/5 Tone and bulk:normal tone throughout; no atrophy noted Sensory: Decreased sensation on the right to light touch. Deep Tendon Reflexes: 2+ and symmetric throughout Plantars: Right: downgoing   Left: downgoing Cerebellar: normal finger-to-nose and normal heel-to-shin test right side. Gait: did not ambulate for safety reasons CV: pulses intact trace edema  Laboratory Studies:   Basic Metabolic Panel:  Lab [id number] 1428  NA 143  K 3.7  CL 100  CO2 --  GLUCOSE 131*  BUN 13  CREATININE 1.60*  CALCIUM --  MG --  PHOS --    Liver Function Tests: No results found for this basename: AST:5,ALT:5,ALKPHOS:5,BILITOT:5,PROT:5,ALBUMIN:5 in the last 168 hours No results found for this basename: LIPASE:5,AMYLASE:5 in the last 168 hours No results found for this basename: AMMONIA:3 in the last 168 hours  CBC:  Lab 11/11/12 1428  WBC --  NEUTROABS --  HGB 11.9*  HCT 35.0*  MCV --  PLT --    Cardiac Enzymes: No results found for this basename: CKTOTAL:5,CKMB:5,CKMBINDEX:5,TROPONINI:5 in the last 168 hours  BNP: No components found with this basename: POCBNP:5  CBG: No results found for this basename: GLUCAP:5 in the last 168 hours  Microbiology: Results for orders placed in visit on 03/03/12  URINE CULTURE     Status: Normal   Collection Time   03/03/12  3:16 PM      Component Value Range Status Comment   Colony Count NO GROWTH   Final    Organism ID, Bacteria NO GROWTH   Final     Coagulation Studies: No results found for this basename: LABPROT:5,INR:5 in the last 72 hours  Urinalysis: No results found for this basename: COLORURINE:2,APPERANCEUR:2,LABSPEC:2,PHURINE:2,GLUCOSEU:2,HGBUR:2,BILIRUBINUR:2,KETONESUR:2,PROTEINUR:2,UROBILINOGEN:2,NITRITE:2,LEUKOCYTESUR:2 in the last 168 hours  Lipid Panel:     Component Value Date/Time   CHOL  Value: 157        ATP III CLASSIFICATION:  <200     mg/dL   Desirable  200-239  mg/dL   Borderline High  >=240    mg/dL    High        10/22/2010 0506   TRIG 262* 10/22/2010 0506   HDL 43 10/22/2010 0506   CHOLHDL 3.7 10/22/2010 0506   VLDL 52* 10/22/2010 0506   LDLCALC  Value:  62        Total Cholesterol/HDL:CHD Risk Coronary Heart Disease Risk Table                     Men   Women  1/2 Average Risk   3.4   3.3  Average Risk       5.0   4.4  2 X Average Risk   9.6   7.1  3 X Average Risk  23.4   11.0        Use the calculated Patient Ratio above and the CHD Risk Table to determine the patient's CHD Risk.        ATP III CLASSIFICATION (LDL):  <100     mg/dL   Optimal  100-129  mg/dL   Near or Above                    Optimal  130-159  mg/dL   Borderline  160-189  mg/dL   High  >190     mg/dL   Very High 10/22/2010 0506    HgbA1C:  Lab Results  Component Value Date   HGBA1C  Value: 6.8 (NOTE)                                                                       According to the ADA Clinical Practice Recommendations for 2011, when HbA1c is used as a screening test:   >=6.5%   Diagnostic of Diabetes Mellitus           (if abnormal result  is confirmed)  5.7-6.4%   Increased risk of developing Diabetes Mellitus  References:Diagnosis and Classification of Diabetes Mellitus,Diabetes [id number] 1):S62-S69 and Standards of Medical Care in         Diabetes - 2011,Diabetes [id number]  (Suppl 1):S11-S61.* 10/21/2010    Urine Drug Screen:     Component Value Date/Time   LABOPIA NONE DETECTED 10/21/2010 2354   COCAINSCRNUR NONE DETECTED 10/21/2010 2354   LABBENZ POSITIVE* 10/21/2010 2354   AMPHETMU NONE DETECTED 10/21/2010 2354   THCU NONE DETECTED 10/21/2010 2354   LABBARB  Value: NONE DETECTED        DRUG SCREEN FOR MEDICAL PURPOSES ONLY.  IF CONFIRMATION IS NEEDED FOR ANY PURPOSE, NOTIFY LAB WITHIN 5 DAYS.        LOWEST DETECTABLE LIMITS FOR URINE DRUG SCREEN Drug Class       Cutoff (ng/mL) Amphetamine      1000 Barbiturate      200 Benzodiazepine   [id number] Tricyclics       [id number] Opiates          300 Cocaine           300 THC              50 10/21/2010 2354    Alcohol Level: No results found for this basename: ETH:2 in the last 168 hours  Imaging: Head CT-IMPRESSION:  Atrophy and chronic microvascular ischemia. No acute abnormality  CTA of head- IMPRESSION:  Heavily calcified plaque in the left carotid bulb narrowing the  lumen by 25% diameter stenosis.  No significant right carotid stenosis. No significant vertebral  artery stenosis.  Mikey Bussing PA-C [facility] Pager [phone] 11/11/2012,  3:24 PM  Patient seen and examined.  Clinical course and management discussed.  Necessary edits performed.  I agree with the above.  Assessment and plan of care developed and discussed below.     Assessment: 62 y.o. female female with onset of left sided weakness and aphasia / dysarthria this am. Last seen normal at 10 am by her husband.  Head CT normal.  CTA shows a 25% left ICA stenosis.  No other significant abnormalities noted.  Patient with multiple vascular risk factors.  On Plavix and ASA at home.    Stroke Risk Factors - diabetes mellitus and hypertension, previous stroke.  Plan: 1. HgbA1c, fasting lipid panel 2. MRI, MRA  Cannot be performed secondary to biventricular ICD.  May repeat head CT in 2-3 days. 3. PT consult, OT consult, Speech consult 4. Echocardiogram 5. Prophylactic therapy-Continue Plavix - dose 75 mg daily and ASA. 6. Risk factor modification 7. Telemetry monitoring 8. Frequent neuro checks  This patient is critically ill and at significant risk of neurological worsening, death and care requires constant monitoring of vital signs, hemodynamics,respiratory and cardiac monitoring, neurological assessment, discussion with family, other specialists and medical decision making of high complexity. I spent 60 minutes of neurocritical care time  in the care of  this patient.  Alexis Goodell, MD [facility] [phone] 11/11/2012  4:52 PM

## 2012-11-11 NOTE — H&P (Signed)
PATIENT DETAILS Name: [name] Age: 62 y.o. Sex: female Date of Birth: 1951-01-20 Admit Date: 11/11/2012 [id number] THIELE, MD   CHIEF COMPLAINT:  Transient left-sided weakness and difficulty speaking  HPI: Patient is a 62 year old African American female with a past medical history of ischemic cardiomyopathy with resultant chronic systolic heart failure, history of SVT status post ablation, status post AICD placement, diabetes, chronic kidney disease, hypertension, claims to have had numerous TIAs in the past (not clear if she has had a CVA) presented to the hospital today with the above-noted complaints. The patient she was in her usual state of health, around 10 AM this morning she was noted to have  left upper and lower extremity weakness associated with dysarthria. She was brought to [facility] by EMS, code stroke was called by the ED physician. CT of the head was negative for any acute abnormalities, patient rapidly improved during the stay here in the hospital as a result patient was not given any TPA. Neurology assessed the patient, and subsequently ordered a CTA of the neck and the brain which did not show any significant intracranial stenosis, it did show around 25% stenosis of the left carotid bulb. Since the patient rapidly improved and is almost back to her usual baseline, I was called to admit this patient for further risk stratification and further evaluation. During my evaluation, patient speech is clear, she has approximately 5/5 strength in all 4 extremities. There is no recent history of headache, chest pain, shortness of breath. No history of abdominal pain or nausea vomiting. There is no history of diarrhea.   ALLERGIES:   Allergies  Allergen Reactions  . Codeine     Unknown  . Digoxin And Related     Unknown  . Diltiazem     Unknown  . Latex     Latex tape  . Metolazone     Unknown  . Morphine Sulfate     Unknown  . Penicillins    Unknown  . Septra Ds (Sulfamethoxazole W/Trimethoprim (Co-Trimoxazole))     Unknown  . Spironolactone     Unknown  . Sulfa Drugs Cross Reactors     Unknown  . Taztia Xt (Diltiazem Hcl)     Unknown  . Tetracyclines & Related     Unknown  . Ticlid (Ticlopidine Hcl)     Unknown  . Verapamil     Unknown  . Vicodin (Hydrocodone-Acetaminophen)     Unknown    PAST MEDICAL HISTORY: Past Medical History  Diagnosis Date  . Ischemic cardiomyopathy     status post biventricular ICD placed by DR Edumunds who used to see Dr Melvern Banker here to establish  cardiovascular care.  . Diabetes mellitus   . Hypertension   . Coronary artery disease   . CHF (congestive heart failure)   . Stroke   . MVP (mitral valve prolapse)     Antibiotics not required for procedures  . Asthma   . Anxiety   . Depression   . Hiatal hernia   . Cervical dysplasia   . Fibroid   . Function kidney decreased   . History of shingles     PAST SURGICAL HISTORY: Past Surgical History  Procedure Date  . Insert / replace / remove pacemaker     biventricular defibrillator--06/10/ 2009  . Abdominal hysterectomy     TAH   . Cardiac defibrillator placement   . Cardiac bypass surg     Triple  . Breast surgery  Breast Bx-benign_Left breast lump removed  . Cardiac catheterization   . Colposcopy     MEDICATIONS AT HOME: Prior to Admission medications   Medication Sig Start Date End Date Taking? Authorizing Provider  albuterol (PROVENTIL HFA;VENTOLIN HFA) 108 (90 BASE) MCG/ACT inhaler Inhale 2 puffs into the lungs every 6 (six) hours as needed. For shortness of breath   Yes Historical Provider, MD  ALPRAZolam Duanne Moron) 1 MG tablet Take 0.5-1 mg by mouth 3 (three) times daily. Take 1mg  in the morning and 0.5 mg in the afternoon and at night   Yes Historical Provider, MD  Artificial Tear GEL Apply to eye. 2 drops as needed    Yes Historical Provider, MD  aspirin 325 MG EC tablet Take 325 mg by mouth daily.     Yes  Historical Provider, MD  clopidogrel (PLAVIX) 75 MG tablet Take 75 mg by mouth daily. 1 tab daily 05/05/11  Yes Historical Provider, MD  conjugated estrogens (PREMARIN) vaginal cream Place vaginally 3 (three) times a week. 0.5 grams in vagina 3 times a week 03/07/12 03/07/13 Yes Bennetta Laos, MD  ezetimibe (ZETIA) 10 MG tablet Take 10 mg by mouth daily.   Yes Historical Provider, MD  fluticasone (FLONASE) 50 MCG/ACT nasal spray Place 2 sprays into the nose daily.     Yes Historical Provider, MD  furosemide (LASIX) 80 MG tablet Take 80 mg by mouth 2 (two) times daily.  04/02/11  Yes Historical Provider, MD  isosorbide mononitrate (IMDUR) 30 MG 24 hr tablet Take 30 mg by mouth daily.   Yes Historical Provider, MD  loratadine (CLARITIN) 10 MG tablet Take 10 mg by mouth daily.   Yes Historical Provider, MD  metFORMIN (GLUCOPHAGE) 850 MG tablet Take 850 mg by mouth daily with breakfast.   Yes Historical Provider, MD  metoprolol (LOPRESSOR) 100 MG tablet Take 100 mg by mouth 2 (two) times daily. 1 tab twice a day 03/11/11  Yes Historical Provider, MD  Multiple Vitamin (MULTIVITAMIN WITH MINERALS) TABS Take 1 tablet by mouth daily.   Yes Historical Provider, MD  nitroGLYCERIN (NITROSTAT) 0.4 MG SL tablet Place 0.4 mg under the tongue every 5 (five) minutes as needed. For chest pain   Yes Historical Provider, MD  ondansetron (ZOFRAN) 8 MG tablet Take 8 mg by mouth every 8 (eight) hours as needed. For nausea   Yes Historical Provider, MD  oxybutynin (DITROPAN-XL) 10 MG 24 hr tablet Take 10 mg by mouth at bedtime. 1 tab daily prn 05/25/11  Yes Historical Provider, MD  pantoprazole (PROTONIX) 40 MG tablet Take 40 mg by mouth daily.   Yes Historical Provider, MD  potassium chloride SA (K-DUR,KLOR-CON) 20 MEQ tablet Take 20 mEq by mouth 2 (two) times daily. 1 tab twice a day 06/15/11  Yes Historical Provider, MD  ramipril (ALTACE) 10 MG capsule Take 10 mg by mouth 2 (two) times daily.    Yes Historical Provider, MD    simvastatin (ZOCOR) 20 MG tablet Take 20 mg by mouth at bedtime. 1 tab daily 04/16/11  Yes Historical Provider, MD  sodium chloride (MURO 128) 5 % ophthalmic ointment Place 1 drop into both eyes daily as needed. For dry eyes   Yes Historical Provider, MD  traMADol (ULTRAM) 50 MG tablet Take 50 mg by mouth every 6 (six) hours as needed. For pain   Yes Historical Provider, MD  venlafaxine (EFFEXOR) 100 MG tablet Take 300 mg by mouth every morning. 2 tabs am 1 tab in pm 04/12/11  Yes Historical Provider, MD    FAMILY HISTORY: Family History  Problem Relation Age of Onset  . Heart disease Mother   . Hypertension Mother   . Heart disease Father   . Hypertension Father   . Diabetes Father   . Kidney failure Father   . Heart disease Brother   . Diabetes Brother   . Kidney failure Brother   . Diabetes Paternal Grandmother     SOCIAL HISTORY:  reports that she has never smoked. She does not have any smokeless tobacco history on file. She reports that she drinks alcohol. Her drug history not on file.  REVIEW OF SYSTEMS:  Constitutional:   No  weight loss, night sweats,  Fevers, chills, fatigue.  HEENT:    No headaches, Difficulty swallowing,Tooth/dental problems,Sore throat,  No sneezing, itching, ear ache, nasal congestion, post nasal drip,   Cardio-vascular: No chest pain,  Orthopnea, PND, swelling in lower extremities, anasarca, dizziness, palpitations  GI:  No heartburn, indigestion, abdominal pain, nausea, vomiting, diarrhea, change in       bowel habits, loss of appetite  Resp: No shortness of breath with exertion or at rest.  No excess mucus, no productive cough, No non-productive cough,  No coughing up of blood.No change in color of mucus.No wheezing.No chest wall deformity  Skin:  no rash or lesions.  GU:  no dysuria, change in color of urine, no urgency or frequency.  No flank pain.  Musculoskeletal: No joint pain or swelling.  No decreased range of motion.  No back  pain.  Psych: No change in mood or affect. No depression or anxiety.  No memory loss.   PHYSICAL EXAM: Blood pressure 145/86, pulse 64, temperature 98.3 F (36.8 C), temperature source Oral, resp. rate 16, SpO2 100.00%.  General appearance :Awake, alert, not in any distress. Speech Clear. Not toxic Looking HEENT: Atraumatic and Normocephalic, pupils equally reactive to light and accomodation Neck: supple, no JVD. No cervical lymphadenopathy.  Chest:Good air entry bilaterally, no added sounds  CVS: S1 S2 regular, no murmurs.  Abdomen: Bowel sounds present, Non tender and not distended with no gaurding, rigidity or rebound. Extremities: B/L Lower Ext shows no edema, both legs are warm to touch Neurology: Awake alert, and oriented X 3, CN II-XII intact, Non focal Skin:No Rash Wounds:N/A  LABS ON ADMISSION:   Basename 11/11/12 1428 11/11/12 1416  NA 143 140  K 3.7 3.7  CL 100 98  CO2 -- 30  GLUCOSE 131* 136*  BUN 13 13  CREATININE 1.60* 1.74*  CALCIUM -- 9.7  MG -- --  PHOS -- --    Basename 11/11/12 1416  AST 29  ALT 22  ALKPHOS 62  BILITOT 0.5  PROT 7.8  ALBUMIN 4.1   No results found for this basename: LIPASE:2,AMYLASE:2 in the last 72 hours  Basename 11/11/12 1428 11/11/12 1416  WBC -- 6.3  NEUTROABS -- 3.0  HGB 11.9* 10.6*  HCT 35.0* 32.5*  MCV -- 87.1  PLT -- 219    Basename 11/11/12 1417  CKTOTAL --  CKMB --  CKMBINDEX --  TROPONINI <0.30   No results found for this basename: DDIMER:2 in the last 72 hours No components found with this basename: POCBNP:3   RADIOLOGIC STUDIES ON ADMISSION: Ct Angio Head W/cm &/or Wo Cm  11/11/2012  *RADIOLOGY REPORT*  Clinical Data:  Code stroke with left-sided weakness and slurred speech.  Altered mental status.  CT ANGIOGRAPHY HEAD AND NECK  Technique:  Multidetector CT imaging of  the head and neck was performed using the standard protocol during bolus administration of intravenous contrast.  Multiplanar CT image  reconstructions including MIPs were obtained to evaluate the vascular anatomy. Carotid stenosis measurements (when applicable) are obtained utilizing NASCET criteria, using the distal internal carotid diameter as the denominator.  Contrast: 55mL OMNIPAQUE IOHEXOL 350 MG/ML SOLN  Comparison:  CT head 11/11/2012  CTA NECK  Findings:  Lung apices are clear.  No acute bony abnormality in the cervical spine.  No mass or adenopathy is detected in the neck.  Right carotid:  Right common carotid artery is widely patent. Small calcified plaque in the carotid bulb on the right without significant stenosis.  No evidence of dissection.  Left carotid:  Mild calcified plaque in the mid left common carotid artery without significant stenosis.  Large calcified plaque involving the left carotid bulb.  There is 25% narrowing of the lumen of the left internal carotid artery.  Left external carotid artery is widely patent.  Both vertebral arteries are patent to the basilar without stenosis.   Review of the MIP images confirms the above findings.  IMPRESSION: Heavily calcified plaque in the left carotid bulb narrowing the lumen by 25% diameter stenosis.  No significant right carotid stenosis.  No significant vertebral artery stenosis.  CTA HEAD  Findings:  Postcontrast imaging of the brain reveals normal enhancement.  No acute infarct or hemorrhage.  Ventricles are normal in size.  Both vertebral arteries are patent to the basilar.  The basilar is widely patent.  PICA is patent bilaterally.  Superior cerebellar and posterior cerebral arteries are patent bilaterally.  Atherosclerotic calcification in the cavernous carotid bilaterally without significant stenosis.  Anterior and middle cerebral arteries are patent bilaterally without significant stenosis.  Negative for cerebral aneurysm.   Review of the MIP images confirms the above findings.  IMPRESSION: No significant intracranial stenosis.   Original Report Authenticated By: Carl Best, M.D.    Ct Head Wo Contrast  11/11/2012  *RADIOLOGY REPORT*  Clinical Data: Code stroke with left-sided weakness and slurred speech.  CT HEAD WITHOUT CONTRAST  Technique:  Contiguous axial images were obtained from the base of the skull through the vertex without contrast.  Comparison: CT 10/22/2010  Findings: Mild generalized atrophy.  Mild to moderate chronic microvascular ischemia in the white matter.  This is unchanged from the prior study.  No acute infarct.  Negative for hemorrhage or mass.  Calvarium is intact.  IMPRESSION: Atrophy and chronic microvascular ischemia.  No acute abnormality.  Critical Value/emergent results were called by telephone at the time of interpretation on 11/11/2012 at 1450 hours to Dr. Doy Mince, who verbally acknowledged these results.   Original Report Authenticated By: Carl Best, M.D.    Ct Angio Neck W/cm &/or Wo/cm  11/11/2012  *RADIOLOGY REPORT*  Clinical Data:  Code stroke with left-sided weakness and slurred speech.  Altered mental status.  CT ANGIOGRAPHY HEAD AND NECK  Technique:  Multidetector CT imaging of the head and neck was performed using the standard protocol during bolus administration of intravenous contrast.  Multiplanar CT image reconstructions including MIPs were obtained to evaluate the vascular anatomy. Carotid stenosis measurements (when applicable) are obtained utilizing NASCET criteria, using the distal internal carotid diameter as the denominator.  Contrast: 72mL OMNIPAQUE IOHEXOL 350 MG/ML SOLN  Comparison:  CT head 11/11/2012  CTA NECK  Findings:  Lung apices are clear.  No acute bony abnormality in the cervical spine.  No mass or adenopathy is detected in  the neck.  Right carotid:  Right common carotid artery is widely patent. Small calcified plaque in the carotid bulb on the right without significant stenosis.  No evidence of dissection.  Left carotid:  Mild calcified plaque in the mid left common carotid artery without significant stenosis.   Large calcified plaque involving the left carotid bulb.  There is 25% narrowing of the lumen of the left internal carotid artery.  Left external carotid artery is widely patent.  Both vertebral arteries are patent to the basilar without stenosis.   Review of the MIP images confirms the above findings.  IMPRESSION: Heavily calcified plaque in the left carotid bulb narrowing the lumen by 25% diameter stenosis.  No significant right carotid stenosis.  No significant vertebral artery stenosis.  CTA HEAD  Findings:  Postcontrast imaging of the brain reveals normal enhancement.  No acute infarct or hemorrhage.  Ventricles are normal in size.  Both vertebral arteries are patent to the basilar.  The basilar is widely patent.  PICA is patent bilaterally.  Superior cerebellar and posterior cerebral arteries are patent bilaterally.  Atherosclerotic calcification in the cavernous carotid bilaterally without significant stenosis.  Anterior and middle cerebral arteries are patent bilaterally without significant stenosis.  Negative for cerebral aneurysm.   Review of the MIP images confirms the above findings.  IMPRESSION: No significant intracranial stenosis.   Original Report Authenticated By: Carl Best, M.D.     ASSESSMENT AND PLAN: Present on Admission:  . CVA (cerebral infarction) vs TIA - Will admit to telemetry -Patient already on aspirin and Plavix-which will be continued -Cannot do an MRI on this patient as she has a AICD in place. Since CTA of the head and neck already done, I will just order a 2-D echocardiogram. -She may need interrogation of the AICD-she has had apparently numerous TIAs in the past-if there is any evidence of atrial fibrillation she may need anticoagulation.   . Chronic systolic heart failure - Currently compensated  - Awaiting 2-D echocardiogram  - Continue with current medications   . Paroxysmal SVT (supraventricular tachycardia) - Status post ablation  - Has AICD in place   .  Coronary artery disease - Continue with aspirin, Plavix, beta blockers and statin  - She is currently chest pain-free   . Depression - Continue with venlafaxine   . HTN (hypertension) - Continue with her usual antihypertensive medications.   . DM (diabetes mellitus) - Hold metformin, as she was given contrast. Resume it 48 hours.  - Place on sliding scale insulin while inpatient   . CKD (chronic kidney disease) - Has a history of chronic kidney disease-sees Dr. Posey Pronto (nephrology) as outpatient  - Did receive contrast for CTA of the neck and brain, we need to follow renal function tomorrow. Clinically looks euvolemic.   [name] Anxiety - Continue with Xanax  Further plan will depend as patient's clinical course evolves and further radiologic and laboratory data become available. Patient will be monitored closely.  DVT Prophylaxis: - Prophylactic heparin  Code Status: - Full code  Total time spent for admission equals 45 minutes.  [facility] Pager [phone]  If 7PM-7AM, please contact night-coverage [email or web address] Password [facility] 11/11/2012, 4:52 PM

## 2012-11-11 NOTE — ED Notes (Signed)
Called for report on the floor, put on hold for 15 minutes.

## 2012-11-11 NOTE — ED Notes (Signed)
Chaplin  called for for family. Family updated that PT is in CT scanner.

## 2012-11-11 NOTE — ED Notes (Signed)
PA neurology at bedside.

## 2012-11-11 NOTE — ED Provider Notes (Signed)
History     CSN: [id number]  Arrival date & time 11/11/12  1414   First MD Initiated Contact with Patient 11/11/12 1415      Chief Complaint  Patient presents with  . Code Stroke    (Consider location/radiation/quality/duration/timing/severity/associated sxs/prior treatment) HPI Comments: 62 y.o. female with a history of a previous CVA. She was last seen normal at 10 am today by her husband. The patient took a nap and awoke with left sided weakness and difficulty with her speech. EMS was called and the patient was brought to the [facility] ED. Neuro at bedside for the patient. She is able to cough for me and following commands. No anticoagulation.  The history is provided by the patient and medical records.    Past Medical History  Diagnosis Date  . Ischemic cardiomyopathy     status post biventricular ICD placed by DR Edumunds who used to see Dr Melvern Banker here to establish  cardiovascular care.  . Diabetes mellitus   . Hypertension   . Coronary artery disease   . CHF (congestive heart failure)   . Stroke   . MVP (mitral valve prolapse)     Antibiotics not required for procedures  . Asthma   . Anxiety   . Depression   . Hiatal hernia   . Cervical dysplasia   . Fibroid   . Function kidney decreased   . History of shingles     Past Surgical History  Procedure Date  . Insert / replace / remove pacemaker     biventricular defibrillator--06/10/ 2009  . Abdominal hysterectomy     TAH   . Cardiac defibrillator placement   . Cardiac bypass surg     Triple  . Breast surgery     Breast Bx-benign_Left breast lump removed  . Cardiac catheterization   . Colposcopy     Family History  Problem Relation Age of Onset  . Heart disease Mother   . Hypertension Mother   . Heart disease Father   . Hypertension Father   . Diabetes Father   . Kidney failure Father   . Heart disease Brother   . Diabetes Brother   . Kidney failure Brother   . Diabetes Paternal Grandmother     History   Substance Use Topics  . Smoking status: Never Smoker   . Smokeless tobacco: Not on file  . Alcohol Use: Yes     Comment: rare    OB History    Grav Para Term Preterm Abortions TAB SAB Ect Mult Living   7 2  2 5     1       Review of Systems  Unable to perform ROS Constitutional: Positive for activity change.  HENT: Negative for neck pain.   Eyes: Negative for visual disturbance.  Respiratory: Negative for chest tightness.   Cardiovascular: Negative for chest pain.  Gastrointestinal: Negative for abdominal distention.  Skin: Negative for wound.  Neurological: Positive for speech difficulty and weakness. Negative for numbness.  Hematological: Does not bruise/bleed easily.  Psychiatric/Behavioral: Negative for confusion.    Allergies  Codeine; Digoxin and related; Diltiazem; Latex; Metolazone; Morphine sulfate; Penicillins; Septra ds; Spironolactone; Sulfa drugs cross reactors; Taztia xt; Tetracyclines & related; Ticlid; Verapamil; and Vicodin  Home Medications   Current Outpatient Rx  Name  Route  Sig  Dispense  Refill  . ALBUTEROL SULFATE HFA 108 (90 BASE) MCG/ACT IN AERS   Inhalation   Inhale 2 puffs into the lungs every 6 (six) hours as  needed. For shortness of breath         . ALPRAZOLAM 1 MG PO TABS   Oral   Take 0.5-1 mg by mouth 3 (three) times daily. Take 1mg  in the morning and 0.5 mg in the afternoon and at night         . ARTIFICIAL TEAR OP GEL   Ophthalmic   Apply to eye. 2 drops as needed          . ASPIRIN 325 MG PO TBEC   Oral   Take 325 mg by mouth daily.           [name] CLOPIDOGREL BISULFATE 75 MG PO TABS   Oral   Take 75 mg by mouth daily. 1 tab daily         . ESTROGENS, CONJUGATED 0.625 MG/GM VA CREA   Vaginal   Place vaginally 3 (three) times a week. 0.5 grams in vagina 3 times a week   42.5 g   12     This is correct rx with correct directions.   [name] EZETIMIBE 10 MG PO TABS   Oral   Take 10 mg by mouth daily.         [name]  FLUTICASONE PROPIONATE 50 MCG/ACT NA SUSP   Nasal   Place 2 sprays into the nose daily.           . FUROSEMIDE 80 MG PO TABS   Oral   Take 80 mg by mouth 2 (two) times daily.          . ISOSORBIDE MONONITRATE ER 30 MG PO TB24   Oral   Take 30 mg by mouth daily.         [name] LORATADINE 10 MG PO TABS   Oral   Take 10 mg by mouth daily.         [name] METFORMIN HCL 850 MG PO TABS   Oral   Take 850 mg by mouth daily with breakfast.         . METOPROLOL TARTRATE 100 MG PO TABS   Oral   Take 100 mg by mouth 2 (two) times daily. 1 tab twice a day         . ADULT MULTIVITAMIN W/MINERALS CH   Oral   Take 1 tablet by mouth daily.         [name] NITROGLYCERIN 0.4 MG SL SUBL   Sublingual   Place 0.4 mg under the tongue every 5 (five) minutes as needed. For chest pain         . ONDANSETRON HCL 8 MG PO TABS   Oral   Take 8 mg by mouth every 8 (eight) hours as needed. For nausea         . OXYBUTYNIN CHLORIDE ER 10 MG PO TB24   Oral   Take 10 mg by mouth at bedtime. 1 tab daily prn         . PANTOPRAZOLE SODIUM 40 MG PO TBEC   Oral   Take 40 mg by mouth daily.         [name] POTASSIUM CHLORIDE CRYS ER 20 MEQ PO TBCR   Oral   Take 20 mEq by mouth 2 (two) times daily. 1 tab twice a day         . RAMIPRIL 10 MG PO CAPS   Oral   Take 10 mg by mouth 2 (two) times daily.          [name] SIMVASTATIN 20 MG  PO TABS   Oral   Take 20 mg by mouth at bedtime. 1 tab daily         . SODIUM CHLORIDE (HYPERTONIC) 5 % OP OINT   Both Eyes   Place 1 drop into both eyes daily as needed. For dry eyes         . TRAMADOL HCL 50 MG PO TABS   Oral   Take 50 mg by mouth every 6 (six) hours as needed. For pain         . VENLAFAXINE HCL 100 MG PO TABS   Oral   Take 300 mg by mouth every morning. 2 tabs am 1 tab in pm           BP 146/89  Pulse 71  Temp 98.3 F (36.8 C) (Oral)  Resp 16  SpO2 95%  Physical Exam  Vitals reviewed. Constitutional: She is oriented to person,  place, and time. She appears well-developed and well-nourished.  HENT:  Head: Normocephalic and atraumatic.  Eyes: EOM are normal. Pupils are equal, round, and reactive to light.  Neck: Neck supple.  Cardiovascular: Normal rate, regular rhythm and normal heart sounds.   No murmur heard. Pulmonary/Chest: Effort normal. No respiratory distress.  Abdominal: Soft. She exhibits no distension. There is no tenderness. There is no rebound and no guarding.  Neurological: She is alert and oriented to person, place, and time.       Left sided upper extremity weakness, slurred speech.  Skin: Skin is warm and dry.    ED Course  Procedures (including critical care time)  Labs Reviewed  CBC - Abnormal; Notable for the following:    RBC 3.73 (*)     Hemoglobin 10.6 (*)     HCT 32.5 (*)     All other components within normal limits  COMPREHENSIVE METABOLIC PANEL - Abnormal; Notable for the following:    Glucose, Bld 136 (*)     Creatinine, Ser 1.74 (*)     GFR calc non Af Amer 31 (*)     GFR calc Af Amer 36 (*)     All other components within normal limits  POCT I-STAT, CHEM 8 - Abnormal; Notable for the following:    Creatinine, Ser 1.60 (*)     Glucose, Bld 131 (*)     Calcium, Ion 1.11 (*)     Hemoglobin 11.9 (*)     HCT 35.0 (*)     All other components within normal limits  GLUCOSE, CAPILLARY - Abnormal; Notable for the following:    Glucose-Capillary 131 (*)     All other components within normal limits  PROTIME-INR  APTT  DIFFERENTIAL  TROPONIN I  POCT I-STAT TROPONIN I   Ct Angio Head W/cm &/or Wo Cm  11/11/2012  *RADIOLOGY REPORT*  Clinical Data:  Code stroke with left-sided weakness and slurred speech.  Altered mental status.  CT ANGIOGRAPHY HEAD AND NECK  Technique:  Multidetector CT imaging of the head and neck was performed using the standard protocol during bolus administration of intravenous contrast.  Multiplanar CT image reconstructions including MIPs were obtained to  evaluate the vascular anatomy. Carotid stenosis measurements (when applicable) are obtained utilizing NASCET criteria, using the distal internal carotid diameter as the denominator.  Contrast: 22mL OMNIPAQUE IOHEXOL 350 MG/ML SOLN  Comparison:  CT head 11/11/2012  CTA NECK  Findings:  Lung apices are clear.  No acute bony abnormality in the cervical spine.  No mass or adenopathy is detected  in the neck.  Right carotid:  Right common carotid artery is widely patent. Small calcified plaque in the carotid bulb on the right without significant stenosis.  No evidence of dissection.  Left carotid:  Mild calcified plaque in the mid left common carotid artery without significant stenosis.  Large calcified plaque involving the left carotid bulb.  There is 25% narrowing of the lumen of the left internal carotid artery.  Left external carotid artery is widely patent.  Both vertebral arteries are patent to the basilar without stenosis.   Review of the MIP images confirms the above findings.  IMPRESSION: Heavily calcified plaque in the left carotid bulb narrowing the lumen by 25% diameter stenosis.  No significant right carotid stenosis.  No significant vertebral artery stenosis.  CTA HEAD  Findings:  Postcontrast imaging of the brain reveals normal enhancement.  No acute infarct or hemorrhage.  Ventricles are normal in size.  Both vertebral arteries are patent to the basilar.  The basilar is widely patent.  PICA is patent bilaterally.  Superior cerebellar and posterior cerebral arteries are patent bilaterally.  Atherosclerotic calcification in the cavernous carotid bilaterally without significant stenosis.  Anterior and middle cerebral arteries are patent bilaterally without significant stenosis.  Negative for cerebral aneurysm.   Review of the MIP images confirms the above findings.  IMPRESSION: No significant intracranial stenosis.   Original Report Authenticated By: Carl Best, M.D.    Ct Head Wo Contrast  11/11/2012   *RADIOLOGY REPORT*  Clinical Data: Code stroke with left-sided weakness and slurred speech.  CT HEAD WITHOUT CONTRAST  Technique:  Contiguous axial images were obtained from the base of the skull through the vertex without contrast.  Comparison: CT 10/22/2010  Findings: Mild generalized atrophy.  Mild to moderate chronic microvascular ischemia in the white matter.  This is unchanged from the prior study.  No acute infarct.  Negative for hemorrhage or mass.  Calvarium is intact.  IMPRESSION: Atrophy and chronic microvascular ischemia.  No acute abnormality.  Critical Value/emergent results were called by telephone at the time of interpretation on 11/11/2012 at 1450 hours to Dr. Doy Mince, who verbally acknowledged these results.   Original Report Authenticated By: Carl Best, M.D.    Ct Angio Neck W/cm &/or Wo/cm  11/11/2012  *RADIOLOGY REPORT*  Clinical Data:  Code stroke with left-sided weakness and slurred speech.  Altered mental status.  CT ANGIOGRAPHY HEAD AND NECK  Technique:  Multidetector CT imaging of the head and neck was performed using the standard protocol during bolus administration of intravenous contrast.  Multiplanar CT image reconstructions including MIPs were obtained to evaluate the vascular anatomy. Carotid stenosis measurements (when applicable) are obtained utilizing NASCET criteria, using the distal internal carotid diameter as the denominator.  Contrast: 62mL OMNIPAQUE IOHEXOL 350 MG/ML SOLN  Comparison:  CT head 11/11/2012  CTA NECK  Findings:  Lung apices are clear.  No acute bony abnormality in the cervical spine.  No mass or adenopathy is detected in the neck.  Right carotid:  Right common carotid artery is widely patent. Small calcified plaque in the carotid bulb on the right without significant stenosis.  No evidence of dissection.  Left carotid:  Mild calcified plaque in the mid left common carotid artery without significant stenosis.  Large calcified plaque involving the left  carotid bulb.  There is 25% narrowing of the lumen of the left internal carotid artery.  Left external carotid artery is widely patent.  Both vertebral arteries are patent to the basilar without  stenosis.   Review of the MIP images confirms the above findings.  IMPRESSION: Heavily calcified plaque in the left carotid bulb narrowing the lumen by 25% diameter stenosis.  No significant right carotid stenosis.  No significant vertebral artery stenosis.  CTA HEAD  Findings:  Postcontrast imaging of the brain reveals normal enhancement.  No acute infarct or hemorrhage.  Ventricles are normal in size.  Both vertebral arteries are patent to the basilar.  The basilar is widely patent.  PICA is patent bilaterally.  Superior cerebellar and posterior cerebral arteries are patent bilaterally.  Atherosclerotic calcification in the cavernous carotid bilaterally without significant stenosis.  Anterior and middle cerebral arteries are patent bilaterally without significant stenosis.  Negative for cerebral aneurysm.   Review of the MIP images confirms the above findings.  IMPRESSION: No significant intracranial stenosis.   Original Report Authenticated By: Carl Best, M.D.      No diagnosis found.    MDM   Date: 11/11/2012  Rate:64  Rhythm: normal sinus rhythm  QRS Axis: normal  Intervals: normal  ST/T Wave abnormalities: normal  Conduction Disutrbances: none  Narrative Interpretation: unremarkable  Pt come in with cc of acute dysarthria and left sided weakness. Has hx of strokes and we have concerns for acute stroke. Neurology saw the patient. Ct head is normal. Dr. Doy Mince ordered CT A to ensure that there is no need for intervention - but likely medical admission for risk stratification and optimization.  Reassessment shows improved findings - able to talk and move upper extremities.  CRITICAL CARE Performed by: Varney Biles   Total critical care time: 30 minutes  Critical care time was exclusive  of separately billable procedures and treating other patients.  Critical care was necessary to treat or prevent imminent or life-threatening deterioration.  Critical care was time spent personally by me on the following activities: development of treatment plan with patient and/or surrogate as well as nursing, discussions with consultants, evaluation of patient's response to treatment, examination of patient, obtaining history from patient or surrogate, ordering and performing treatments and interventions, ordering and review of laboratory studies, ordering and review of radiographic studies, pulse oximetry and re-evaluation of patient's condition.     Varney Biles, MD 11/11/12 [phone]

## 2012-11-11 NOTE — ED Notes (Signed)
Previous charting 1804 and 1805 on wrong patient.

## 2012-11-11 NOTE — Progress Notes (Signed)
Chaplain responded to page from ED nurse requesting support for patient's family.  Chaplain met patient's family in the waiting room and took them back to Trauma [facility] to see patient.  Provided support through the [facility] [employer].  Will follow up as needed.  [place]

## 2012-11-11 NOTE — ED Notes (Signed)
Pt here via EMS with symptoms of stroke. Last seen normal per husband today at 10am and EMS notified at 1356. Presenting symptoms slurred speech and left sided weakness. Per EMS pt has ventricular pacer, Hx of stroke with left leg weakness. Vitals per EMS BP 140/100, O2 100 on 2L [place], Pulse 64, CBG 90.

## 2012-11-11 NOTE — ED Notes (Addendum)
Code stroke canceled per Dr. Doy Mince stated by Neurology PA at [phone]

## 2012-11-11 NOTE — ED Notes (Signed)
Pt is in CT at this time.

## 2012-11-11 NOTE — H&P (Deleted)
Consult not H&P    Chief Complaint: Left sided weakness and aphasia HPI: [name] is an 62 y.o. female with a history of a previous CVA. She was last seen normal at 10 am today by her husband. The patient took a nap and awoke with left sided weakness and difficulty with her speech. EMS was called and the patient was brought to the [facility] ED where she had a stat CT of the head. Report pending. The patient is outside the window for TPA ;therefore, a CT angiogram has been ordered to evaluate the patient for other treatment options. NIH scale currently 13.  LSN: 10 am today tPA Given: No - pt outside of the treatment window.  Past Medical History  Diagnosis Date  . Ischemic cardiomyopathy     status post biventricular ICD placed by DR Edumunds who used to see Dr Melvern Banker here to establish  cardiovascular care.  . Diabetes mellitus   . Hypertension   . Coronary artery disease   . CHF (congestive heart failure)   . Stroke   . MVP (mitral valve prolapse)     Antibiotics not required for procedures  . Asthma   . Anxiety   . Depression   . Hiatal hernia   . Cervical dysplasia   . Fibroid   . Function kidney decreased   . History of shingles     Past Surgical History  Procedure Date  . Insert / replace / remove pacemaker     biventricular defibrillator--06/10/ 2009  . Abdominal hysterectomy     TAH   . Cardiac defibrillator placement   . Cardiac bypass surg     Triple  . Breast surgery     Breast Bx-benign_Left breast lump removed  . Cardiac catheterization   . Colposcopy     Family History  Problem Relation Age of Onset  . Heart disease Mother   . Hypertension Mother   . Heart disease Father   . Hypertension Father   . Diabetes Father   . Kidney failure Father   . Heart disease Brother   . Diabetes Brother   . Kidney failure Brother   . Diabetes Paternal Grandmother    Social History:  reports that she has never smoked. She does not have any smokeless tobacco  history on file. She reports that she drinks alcohol. Her drug history not on file.  Allergies:  Allergies  Allergen Reactions  . Codeine   . Digoxin And Related   . Diltiazem   . Latex     Latex tape  . Metolazone   . Morphine Sulfate   . Penicillins   . Septra Ds (Sulfamethoxazole W/Trimethoprim (Co-Trimoxazole))   . Spironolactone   . Sulfa Drugs Cross Reactors   . Taztia Xt (Diltiazem Hcl)   . Tetracyclines & Related   . Ticlid (Ticlopidine Hcl)   . Verapamil   . Vicodin (Hydrocodone-Acetaminophen)      (Not in a hospital admission)  ROS: Unable to obtain secondary to aphasia. No family members present at this time.  Physical Examination: There were no vitals taken for this visit.  General Examination: HEENT-  Normocephalic, no lesions, without obvious abnormality.  Normal external eye and conjunctiva.  Normal TM's bilaterally.  Normal auditory canals and external ears. Normal external nose, mucus membranes and septum.  Normal pharynx. Neck supple with no masses, nodes, nodules or enlargement. Cardiovascular - RRR 2/6 systolic murmer Lungs - Clear anteriorly Abdomen - soft nontender Extremities - pulses intact trace  edema  Neurologic Examination: Mental Status: Not alert but can be aroused.. Oriented to month. Difficult evaluation secondary to aphasia / dysarthria.  Pt attempts to answer questions. Able to follow 1 step commands with prompting. Cranial Nerves: II: Discs unable to visualize.; Visual fields grossly normal, pupils equal, round, reactive to light and accommodation III,IV, VI: ptosis not present, extra-ocular motions intact bilaterally V,VII:Left facial weakness. Decreased sensation on the left. VIII: hearing normal bilaterally IX,X: gag reflex present XI: bilateral shoulder shrug XII: tongue protrudes and deviates to right. Motor: Right : Upper extremity   5/5    Left:     Upper extremity   0/5  Lower extremity   5/5     Lower extremity    0/5 Tone and bulk:normal tone throughout; no atrophy noted Sensory: Decreased sensation on the right to light touch. Deep Tendon Reflexes: 2+ and symmetric throughout Plantars: Right: downgoing   Left: downgoing Cerebellar: normal finger-to-nose and normal heel-to-shin test right side. Gait: did not ambulate for safety reasons CV: pulses intact trace edema  Laboratory Studies:   Basic Metabolic Panel:  Lab [id number] 1428  NA 143  K 3.7  CL 100  CO2 --  GLUCOSE 131*  BUN 13  CREATININE 1.60*  CALCIUM --  MG --  PHOS --    Liver Function Tests: No results found for this basename: AST:5,ALT:5,ALKPHOS:5,BILITOT:5,PROT:5,ALBUMIN:5 in the last 168 hours No results found for this basename: LIPASE:5,AMYLASE:5 in the last 168 hours No results found for this basename: AMMONIA:3 in the last 168 hours  CBC:  Lab 11/11/12 1428  WBC --  NEUTROABS --  HGB 11.9*  HCT 35.0*  MCV --  PLT --    Cardiac Enzymes: No results found for this basename: CKTOTAL:5,CKMB:5,CKMBINDEX:5,TROPONINI:5 in the last 168 hours  BNP: No components found with this basename: POCBNP:5  CBG: No results found for this basename: GLUCAP:5 in the last 168 hours  Microbiology: Results for orders placed in visit on 03/03/12  URINE CULTURE     Status: Normal   Collection Time   03/03/12  3:16 PM      Component Value Range Status Comment   Colony Count NO GROWTH   Final    Organism ID, Bacteria NO GROWTH   Final     Coagulation Studies: No results found for this basename: LABPROT:5,INR:5 in the last 72 hours  Urinalysis: No results found for this basename: COLORURINE:2,APPERANCEUR:2,LABSPEC:2,PHURINE:2,GLUCOSEU:2,HGBUR:2,BILIRUBINUR:2,KETONESUR:2,PROTEINUR:2,UROBILINOGEN:2,NITRITE:2,LEUKOCYTESUR:2 in the last 168 hours  Lipid Panel:     Component Value Date/Time   CHOL  Value: 157        ATP III CLASSIFICATION:  <200     mg/dL   Desirable  200-239  mg/dL   Borderline High  >=240    mg/dL   High         10/22/2010 0506   TRIG 262* 10/22/2010 0506   HDL 43 10/22/2010 0506   CHOLHDL 3.7 10/22/2010 0506   VLDL 52* 10/22/2010 0506   LDLCALC  Value: 62        Total Cholesterol/HDL:CHD Risk Coronary Heart Disease Risk Table                     Men   Women  1/2 Average Risk   3.4   3.3  Average Risk       5.0   4.4  2 X Average Risk   9.6   7.1  3 X Average Risk  23.4   11.0  Use the calculated Patient Ratio above and the CHD Risk Table to determine the patient's CHD Risk.        ATP III CLASSIFICATION (LDL):  <100     mg/dL   Optimal  100-129  mg/dL   Near or Above                    Optimal  130-159  mg/dL   Borderline  160-189  mg/dL   High  >190     mg/dL   Very High 10/22/2010 0506    HgbA1C:  Lab Results  Component Value Date   HGBA1C  Value: 6.8 (NOTE)                                                                       According to the ADA Clinical Practice Recommendations for 2011, when HbA1c is used as a screening test:   >=6.5%   Diagnostic of Diabetes Mellitus           (if abnormal result  is confirmed)  5.7-6.4%   Increased risk of developing Diabetes Mellitus  References:Diagnosis and Classification of Diabetes Mellitus,Diabetes [id number] 1):S62-S69 and Standards of Medical Care in         Diabetes - 2011,Diabetes [id number]  (Suppl 1):S11-S61.* 10/21/2010    Urine Drug Screen:     Component Value Date/Time   LABOPIA NONE DETECTED 10/21/2010 2354   COCAINSCRNUR NONE DETECTED 10/21/2010 2354   LABBENZ POSITIVE* 10/21/2010 2354   AMPHETMU NONE DETECTED 10/21/2010 2354   THCU NONE DETECTED 10/21/2010 2354   LABBARB  Value: NONE DETECTED        DRUG SCREEN FOR MEDICAL PURPOSES ONLY.  IF CONFIRMATION IS NEEDED FOR ANY PURPOSE, NOTIFY LAB WITHIN 5 DAYS.        LOWEST DETECTABLE LIMITS FOR URINE DRUG SCREEN Drug Class       Cutoff (ng/mL) Amphetamine      1000 Barbiturate      200 Benzodiazepine   [id number] Tricyclics       [id number] Opiates          300 Cocaine          300 THC               50 10/21/2010 2354    Alcohol Level: No results found for this basename: ETH:2 in the last 168 hours  Other results: EKG: pending  Imaging: No results found.  Assessment: 62 y.o. female female with onset of left sided weakness and aphasia / dysarthria this am. Last seen normal at 10 am by her husband.  Stroke Risk Factors - diabetes mellitus and hypertension, previous stroke.  Plan: 1. HgbA1c, fasting lipid panel 2. MRI, MRA  Cannot be performed secondary to biventricular ICD. 3. PT consult, OT consult, Speech consult 4. Echocardiogram 5. Carotid dopplers 6. Prophylactic therapy-Antiplatelet med: Plavix - dose 75 mg daily. 7. Risk factor modification 8. Telemetry monitoring 9. Frequent neuro checks  Mikey Bussing PA-C [facility] Pager [phone] 11/11/2012, 3:24 PM

## 2012-11-11 NOTE — ED Notes (Signed)
Pt brought back from CT, undressed, in gown, on monitor, continuous pulse oximetry and blood pressure cuff; EKG performed

## 2012-11-12 ENCOUNTER — Inpatient Hospital Stay (HOSPITAL_COMMUNITY): Payer: 59

## 2012-11-12 ENCOUNTER — Encounter (HOSPITAL_COMMUNITY): Payer: Self-pay | Admitting: *Deleted

## 2012-11-12 DIAGNOSIS — I4891 Unspecified atrial fibrillation: Secondary | ICD-10-CM

## 2012-11-12 LAB — BASIC METABOLIC PANEL
CO2: 30 mEq/L (ref 19–32)
GFR calc non Af Amer: 34 mL/min — ABNORMAL LOW (ref >90)
Glucose, Bld: 119 mg/dL — ABNORMAL HIGH (ref 70–99)
Potassium: 4.2 mEq/L (ref 3.5–5.1)
Sodium: 143 mEq/L (ref 135–145)

## 2012-11-12 LAB — CBC
Hemoglobin: 9.8 g/dL — ABNORMAL LOW (ref 12.0–15.0)
MCHC: 33.3 g/dL (ref 30.0–36.0)
Platelets: 196 10*3/uL (ref 150–400)
RBC: 3.36 MIL/uL — ABNORMAL LOW (ref 3.87–5.11)

## 2012-11-12 LAB — GLUCOSE, CAPILLARY
Glucose-Capillary: 118 mg/dL — ABNORMAL HIGH (ref 70–99)
Glucose-Capillary: 136 mg/dL — ABNORMAL HIGH (ref 70–99)
Glucose-Capillary: 139 mg/dL — ABNORMAL HIGH (ref 70–99)

## 2012-11-12 LAB — LIPID PANEL
Cholesterol: 155 mg/dL (ref 0–200)
Total CHOL/HDL Ratio: 3.2 RATIO

## 2012-11-12 NOTE — Evaluation (Signed)
Occupational Therapy Evaluation Patient Details Name: [name] MRN: [id number] DOB: Dec 04, 1950 Today's Date: 11/12/2012 Time: [id number] OT Time Calculation (min): 25 min  OT Assessment / Plan / Recommendation Clinical Impression  Pt admitted with left side weakness. CVA vs. TIA: Unable to perform MRI due to pacemaker.  Will benefit from acute OT services to address below problem list in prep for return home. Recommend 24/7 assist initally and then supervision/assist as needed.     OT Assessment  Patient needs continued OT Services    Follow Up Recommendations  Outpatient OT;Supervision/Assistance - 24 hour (24/7 sup/asisst initally)    Barriers to Discharge None    Equipment Recommendations  None recommended by OT (pt family report they will purchase shower chair in communit)    Recommendations for Other Services    Frequency  Min 3X/week    Precautions / Restrictions Precautions Precautions: Fall Restrictions Weight Bearing Restrictions: No   Pertinent Vitals/Pain See vitals    ADL  Upper Body Dressing: Performed;Set up Where Assessed - Upper Body Dressing: Unsupported sitting Lower Body Dressing: Performed;Supervision/safety Where Assessed - Lower Body Dressing: Unsupported sitting Toilet Transfer: Simulated;Minimal assistance Toilet Transfer Method: Sit to stand Toilet Transfer Equipment:  (bed) Equipment Used: Gait belt Transfers/Ambulation Related to ADLs: min assist with ambulation, no device.  Pt became light headed while ambulating (reports she felt like things were spinning but did not feel that she would pass out). Returned to pt room and pt returned to bed.  RN aware. ADL Comments: Recommended pt use shower chair in walk in shower due to decreased balance.  Daughter (who is an Therapist, sports) and husband report they will purchase one in the community for pt to use.    OT Diagnosis: Disturbance of vision;Paresis  OT Problem List: Decreased strength;Decreased  activity tolerance;Impaired balance (sitting and/or standing);Impaired vision/perception;Decreased knowledge of use of DME or AE;Impaired UE functional use;Impaired sensation OT Treatment Interventions: Self-care/ADL training;Therapeutic exercise;DME and/or AE instruction;Therapeutic activities;Visual/perceptual remediation/compensation;Patient/family education;Balance training   OT Goals Acute Rehab OT Goals OT Goal Formulation: With patient/family Time For Goal Achievement: 11/19/12 Potential to Achieve Goals: Good ADL Goals Pt Will Perform Grooming: with modified independence;Standing at sink ADL Goal: Grooming - Progress: Goal set today Pt Will Perform Upper Body Dressing: with modified independence;Sitting, chair;Sitting, bed;Unsupported ADL Goal: Upper Body Dressing - Progress: Goal set today Pt Will Perform Lower Body Dressing: with modified independence;Sit to stand from chair;Sit to stand from bed;Unsupported ADL Goal: Lower Body Dressing - Progress: Goal set today Pt Will Transfer to Toilet: with modified independence;Ambulation;with DME;Comfort height toilet ADL Goal: Toilet Transfer - Progress: Goal set today Arm Goals Additional Arm Goal #1: Pt will independently perform LUE HEP to increase strength and GMC/FMC. Arm Goal: Additional Goal #1 - Progress: Goal set today Miscellaneous OT Goals Miscellaneous OT Goal #1: Pt will perform dynamic standing balance task >5 min at mod I level. OT Goal: Miscellaneous Goal #1 - Progress: Goal set today Miscellaneous OT Goal #2: Pt will independently use compensatory techniques during functional mobility, including scanning to left side, to locate 5 objects in environment. OT Goal: Miscellaneous Goal #2 - Progress: Goal set today  Visit Information  Last OT Received On: 11/12/12 Assistance Needed: +1 PT/OT Co-Evaluation/Treatment: Yes    Subjective Data      Prior Functioning     Home Living Lives With: Spouse Available Help at  Discharge: Family;Available 24 hours/day Type of Home: House Home Access: Stairs to enter [employer] of Steps: 13 Entrance Stairs-Rails:  Left (wall on other side) Home Layout: One level Bathroom Shower/Tub: Walk-in shower;Door Armed forces training and education officer: Yes How Accessible: Accessible via walker Home Adaptive Equipment: None Prior Function Level of Independence: Independent Able to Take Stairs?: Yes Driving: Yes Vocation: On disability Communication Communication: No difficulties Dominant Hand: Right         Vision/Perception Vision - Assessment Vision Assessment: Vision impaired - to be further tested in functional context Additional Comments: Possible peripherial field deficit in left eye temporally.    Cognition  Overall Cognitive Status: Appears within functional limits for tasks assessed/performed Arousal/Alertness: Awake/alert Orientation Level: Appears intact for tasks assessed Behavior During Session: [facility] for tasks performed    Extremity/Trunk Assessment Right Upper Extremity Assessment RUE ROM/Strength/Tone: Within functional levels Left Upper Extremity Assessment LUE ROM/Strength/Tone: Deficits LUE ROM/Strength/Tone Deficits: Grip and shoulder flexion 3+/5.  Elbow extension and shoulder flexion/extension 3/5.  LUE Sensation: Deficits LUE Sensation Deficits: decreased sensation to light touch LUE Coordination: Deficits LUE Coordination Deficits: increased time and effort for [facility] and GMC     Mobility Bed Mobility Bed Mobility: Supine to Sit;Sitting - Scoot to Edge of Bed;Sit to Supine Supine to Sit: 5: Supervision;With rails;HOB elevated Sitting - Scoot to Edge of Bed: 5: Supervision Sit to Supine: 5: Supervision;HOB elevated Transfers Transfers: Sit to Stand;Stand to Sit Sit to Stand: 4: Min guard;From bed Stand to Sit: 4: Min guard;To bed Details for Transfer Assistance: min guard for safety     Shoulder  Instructions     Exercise     Balance     End of Session OT - End of Session Equipment Utilized During Treatment: Gait belt Activity Tolerance: Patient limited by fatigue;Other (comment) (c/o lightheadedness during ambulation) Patient left: in bed;with call bell/phone within reach Nurse Communication: Mobility status  GO    11/12/2012 Darrol Jump OTR/L Pager [phone] Office [phone]  Darrol Jump 11/12/2012, 4:00 PM

## 2012-11-12 NOTE — Progress Notes (Signed)
[facility] PROGRESS NOTE  [name] [id number] DOB: March 04, 1951 DOA: 11/11/2012 PCP: Reginia Naas, MD  Assessment/Plan: Principal Problem:  *CVA (cerebral infarction) versus TIA: Unable to get MRI because of pacemaker. Have discussed plan with patient, her symptoms are subjective. She started on aspirin and Plavix for CAD and might benefit from Pradaxa this 92 TIA symptoms. We'll plan to check echocardiogram and discuss the cardiologist who can see her tomorrow in the hospital to help with decision based on that. Patient amenable to plan Active Problems:  Chronic systolic heart failure: No evidence of volume overload  Paroxysmal SVT (supraventricular tachycardia)  Coronary artery disease: Stable  Anxiety  Depression  HTN (hypertension) blood pressure stable well controlled  DM (diabetes mellitus) continue sliding scale  CKD (chronic kidney disease) creatinine stable, repeat labs tomorrow  TIA (transient ischemic attack)  Hemiplegia, unspecified, affecting nondominant side  Chronic a-fib: Status post ablation and pacemaker.   Code Status: Full code Family Communication: Discussed plan with patient and her daughter who is at bedside Disposition Plan: Likely home tomorrow   Consultants:  Neurology  Procedures:  Echocardiogram ordered which is pending  Antibiotics:  None  HPI/Subjective: Patient doing okay. Much better than yesterday. Mild headache yesterday into this morning. She has some complaints of subjective symptoms such as numbness on the tip of her pump and some numbness on the lateral aspects of both her legs.  Objective: Filed Vitals:   11/12/12 0152 11/12/12 0546 11/12/12 1011 11/12/12 1121  BP: 112/59 125/70 120/64 124/73  Pulse: 66 59 68 70  Temp: 98.2 F (36.8 C) 97.9 F (36.6 C) 97.8 F (36.6 C) 97.9 F (36.6 C)  TempSrc: Oral Oral Oral   Resp: 20 18 18 18   Height:      Weight:      SpO2: 98% 100% 99% 99%    Intake/Output  Summary (Last 24 hours) at 11/12/12 1152 Last data filed at 11/12/12 0900  Gross per 24 hour  Intake    240 ml  Output      0 ml  Net    240 ml   Filed Weights   11/11/12 2048  Weight: 75.3 kg (166 lb 0.1 oz)    Exam:   General:  Alert and oriented x3, no acute distress  Cardiovascular: Regular rate and rhythm, [id number], soft 2/6 systolic ejection murmur  Respiratory: Clear to auscultation bilaterally  Abdomen: Soft, nontender, nondistended, positive bowel sounds  Extremities: No clubbing or cyanosis, trace pitting edema  Neuro: No focal deficits. Patient complains of subjective symptoms such as tongue numbness  Data Reviewed: Basic Metabolic Panel:  Lab [id number] 0736 11/11/12 2115 11/11/12 1428 11/11/12 1416  NA 143 -- 143 140  K 4.2 -- 3.7 3.7  CL 103 -- 100 98  CO2 30 -- -- 30  GLUCOSE 119* -- 131* 136*  BUN 14 -- 13 13  CREATININE 1.59* 1.55* 1.60* 1.74*  CALCIUM 9.1 -- -- 9.7  MG -- -- -- --  PHOS -- -- -- --   Liver Function Tests:  Lab 11/11/12 1416  AST 29  ALT 22  ALKPHOS 62  BILITOT 0.5  PROT 7.8  ALBUMIN 4.1   CBC:  Lab 11/12/12 0736 11/11/12 2115 11/11/12 1428 11/11/12 1416  WBC 6.9 6.5 -- 6.3  NEUTROABS -- -- -- 3.0  HGB 9.8* 10.6* 11.9* 10.6*  HCT 29.4* 30.6* 35.0* 32.5*  MCV 87.5 86.2 -- 87.1  PLT 196 204 -- 219   Cardiac Enzymes:  Lab 11/11/12  [place] <0.30   CBG:  Lab 11/12/12 0652 11/11/12 2213 11/11/12 1911 11/11/12 1504  GLUCAP 118* 153* 126* 131*     Studies: Ct Angio Head W/cm &/or Wo Cm  11/11/2012   IMPRESSION: No significant intracranial stenosis.   Original Report Authenticated By: Carl Best, M.D.    Dg Chest 2 View  11/12/2012   IMPRESSION: Post CABG and pacemaker / AICD. No acute abnormalities.   Original Report Authenticated By: Lavonia Dana, M.D.    Ct Head Wo Contrast  11/11/2012  IMPRESSION: Atrophy and chronic microvascular ischemia.  No acute abnormality.   Critical Value/emergent results were called by telephone at the time of interpretation on 11/11/2012 at 1450 hours to Dr. Doy Mince, who verbally acknowledged these results.   Original Report Authenticated By: Carl Best, M.D.    Ct Angio Neck W/cm &/or Wo/cm  11/11/2012    IMPRESSION: Heavily calcified plaque in the left carotid bulb narrowing the lumen by 25% diameter stenosis.  No significant right carotid stenosis.  No significant vertebral artery stenosis.     Scheduled Meds:   . ALPRAZolam  0.5-1 mg Oral TID  . aspirin  300 mg Rectal Daily   Or  . aspirin  325 mg Oral Daily  . clopidogrel  75 mg Oral Daily  . ezetimibe  10 mg Oral Daily  . fluticasone  2 spray Each Nare Daily  . furosemide  80 mg Oral BID  . heparin  5,000 Units Subcutaneous Q8H  . insulin aspart  0-9 Units Subcutaneous TID WC  . isosorbide mononitrate  30 mg Oral Daily  . loratadine  10 mg Oral Daily  . metoprolol  100 mg Oral BID  . multivitamin with minerals  1 tablet Oral Daily  . oxybutynin  10 mg Oral QHS  . pantoprazole  40 mg Oral Daily  . potassium chloride SA  20 mEq Oral BID  . ramipril  10 mg Oral BID  . simvastatin  20 mg Oral QHS  . venlafaxine  100 mg Oral QHS  . venlafaxine  200 mg Oral q morning - 10a   Continuous Infusions:   Principal Problem:  *CVA (cerebral infarction) Active Problems:  Chronic systolic heart failure  Paroxysmal SVT (supraventricular tachycardia)  Coronary artery disease  Anxiety  Depression  HTN (hypertension)  DM (diabetes mellitus)  CKD (chronic kidney disease)  TIA (transient ischemic attack)  Hemiplegia, unspecified, affecting nondominant side  Chronic a-fib    Time spent: 25 min    [place] [facility] Pager [phone]. If 8PM-8AM, please contact night-coverage at [email or web address], password [facility] 11/12/2012, 11:52 AM  LOS: 1 day

## 2012-11-12 NOTE — Progress Notes (Signed)
Stroke Team Progress Note  HISTORY [name] is an 62 y.o. female with a history of a previous CVA. She was last seen normal at 10 am 11/11/12  by her husband. The patient took a nap and awoke with left sided weakness and difficulty with her speech. EMS was called and the patient was brought to the [facility] ED where she had a stat CT of the head. The patient is outside the window for TPA so a CT angiogram was ordered to evaluate the patient for other treatment options. NIH scale was initially 13; however, her symptoms reversed without treatment in the emergency department. The pt reports a similar episode about 2 months ago but not as severe so she did not seek medical treatment. She had been on coumadin in the past for Afib but this was stopped due to bruising. She is a vegetarian and likes to eat green leafy vegetables. Apparently there was difficulty adjusting her coumadin dose. LSN: 10 am 11/11/12  tPA Given: No - pt outside of the treatment window, rapid resolution of symptoms   She was admitted to [facility] for further evaluation and treatment.  SUBJECTIVE Overall she feels her condition is significantly improved. There were no family members present on a.m. rounds. I was asked to see the patient again around noon today for possible visual field deficits. The nurse had examined the patient and felt there were some abnormalities. Visual fields appear to be full and intact per my testing. The patient's daughter was in the room at the time. I encouraged the patient and her daughter to let us know immediately if there were any new changes.  OBJECTIVE Most recent Vital Signs: Filed Vitals:   11/12/12 0152 11/12/12 0546 11/12/12 1011 11/12/12 1121  BP: 112/59 125/70 120/64 124/73  Pulse: 66 59 68 70  Temp: 98.2 F (36.8 C) 97.9 F (36.6 C) 97.8 F (36.6 C) 97.9 F (36.6 C)  TempSrc: Oral Oral Oral   Resp: 20 18 18 18   Height:      Weight:      SpO2: 98% 100% 99% 99%   CBG (last 3)    Basename 11/12/12 0652 11/11/12 2213 11/11/12 1911  GLUCAP 118* 153* 126*    IV Fluid Intake:     MEDICATIONS    . ALPRAZolam  0.5-1 mg Oral TID  . aspirin  300 mg Rectal Daily   Or  . aspirin  325 mg Oral Daily  . clopidogrel  75 mg Oral Daily  . ezetimibe  10 mg Oral Daily  . fluticasone  2 spray Each Nare Daily  . furosemide  80 mg Oral BID  . heparin  5,000 Units Subcutaneous Q8H  . insulin aspart  0-9 Units Subcutaneous TID WC  . isosorbide mononitrate  30 mg Oral Daily  . loratadine  10 mg Oral Daily  . metoprolol  100 mg Oral BID  . multivitamin with minerals  1 tablet Oral Daily  . oxybutynin  10 mg Oral QHS  . pantoprazole  40 mg Oral Daily  . potassium chloride SA  20 mEq Oral BID  . ramipril  10 mg Oral BID  . simvastatin  20 mg Oral QHS  . venlafaxine  100 mg Oral QHS  . venlafaxine  200 mg Oral q morning - 10a   PRN:  acetaminophen, acetaminophen, nitroGLYCERIN, ondansetron (ZOFRAN) IV, senna-docusate, sodium chloride, traMADol  Diet:  Carb Control no restrictions on liquids Activity:  Up with assistance DVT Prophylaxis: Subcutaneous heparin  CLINICALLY SIGNIFICANT STUDIES Basic Metabolic Panel:  Lab [id number] 0736 11/11/12 2115 11/11/12 1428 11/11/12 1416  NA 143 -- 143 --  K 4.2 -- 3.7 --  CL 103 -- 100 --  CO2 30 -- -- 30  GLUCOSE 119* -- 131* --  BUN 14 -- 13 --  CREATININE 1.59* 1.55* -- --  CALCIUM 9.1 -- -- 9.7  MG -- -- -- --  PHOS -- -- -- --   Liver Function Tests:  Lab 11/11/12 1416  AST 29  ALT 22  ALKPHOS 62  BILITOT 0.5  PROT 7.8  ALBUMIN 4.1   CBC:  Lab 11/12/12 0736 11/11/12 2115 11/11/12 1416  WBC 6.9 6.5 --  NEUTROABS -- -- 3.0  HGB 9.8* 10.6* --  HCT 29.4* 30.6* --  MCV 87.5 86.2 --  PLT 196 204 --   Coagulation:  Lab 11/11/12 1416  LABPROT 12.8  INR 0.97   Cardiac Enzymes:  Lab 11/11/12 1417  CKTOTAL --  CKMB --  CKMBINDEX --  [place] <0.30   Urinalysis: No results found for this basename:  COLORURINE:2,APPERANCEUR:2,LABSPEC:2,PHURINE:2,GLUCOSEU:2,HGBUR:2,BILIRUBINUR:2,KETONESUR:2,PROTEINUR:2,UROBILINOGEN:2,NITRITE:2,LEUKOCYTESUR:2 in the last 168 hours Lipid Panel    Component Value Date/Time   CHOL 155 11/12/2012 0736   TRIG 209* 11/12/2012 0736   HDL 49 11/12/2012 0736   CHOLHDL 3.2 11/12/2012 0736   VLDL 42* 11/12/2012 0736   LDLCALC 64 11/12/2012 0736   HgbA1C  Lab Results  Component Value Date   HGBA1C  Value: 6.8 (NOTE)                                                                       According to the ADA Clinical Practice Recommendations for 2011, when HbA1c is used as a screening test:   >=6.5%   Diagnostic of Diabetes Mellitus           (if abnormal result  is confirmed)  5.7-6.4%   Increased risk of developing Diabetes Mellitus  References:Diagnosis and Classification of Diabetes Mellitus,Diabetes [id number] 1):S62-S69 and Standards of Medical Care in         Diabetes - 2011,Diabetes [id number]  (Suppl 1):S11-S61.* 10/21/2010    Urine Drug Screen:     Component Value Date/Time   LABOPIA NONE DETECTED 10/21/2010 2354   COCAINSCRNUR NONE DETECTED 10/21/2010 2354   LABBENZ POSITIVE* 10/21/2010 2354   AMPHETMU NONE DETECTED 10/21/2010 2354   THCU NONE DETECTED 10/21/2010 2354   LABBARB  Value: NONE DETECTED        DRUG SCREEN FOR MEDICAL PURPOSES ONLY.  IF CONFIRMATION IS NEEDED FOR ANY PURPOSE, NOTIFY LAB WITHIN 5 DAYS.        LOWEST DETECTABLE LIMITS FOR URINE DRUG SCREEN Drug Class       Cutoff (ng/mL) Amphetamine      1000 Barbiturate      200 Benzodiazepine   [id number] Tricyclics       [id number] Opiates          300 Cocaine          300 THC              50 10/21/2010 2354    Alcohol Level: No results found for this basename: ETH:2 in the last 168 hours  Ct Angio Head W/cm &/or Wo  Cm  11/11/2012    IMPRESSION: No significant intracranial stenosis.   Original Report Authenticated By: Carl Best, M.D.     Dg Chest 2 View  11/12/2012  IMPRESSION: Post CABG and pacemaker  / AICD. No acute abnormalities.      Ct Head Wo Contrast 11/11/2012    IMPRESSION: Atrophy and chronic microvascular ischemia.  No acute abnormality.    Ct Angio Neck W/cm &/or Wo/cm  11/11/2012   IMPRESSION: No significant intracranial stenosis.    MRI of the brain  Biventricular pacemaker / AICD  MRA of the brain  Biventricular pacemaker / AICD  2D Echocardiogram  -  Pending  Carotid Doppler  - pt had CT angio of neck  EKG  SR rate 66 with PVCs   Therapy Recommendations - pending  Physical Exam  Per Dr. Irish Elders Visual fields intact by my testing.  ASSESSMENT [name] is a 62 y.o. female presenting with left-sided weakness and difficulty speaking.  TPA was not given since the patient was outside of the treatment window and her symptoms rapidly improved without treatment. Imaging showed Atrophy and chronic microvascular ischemia.  No acute abnormality. Her symptoms were felt to be a TIA probably due to emboli secondary to paroxysmal atrial fibrillation. Work up underway. On clopidogrel 75 mg orally every day prior to admission. Now on aspirin 325 mg orally every day and clopidogrel 75 mg orally every day for secondary stroke prevention. Patient with initial left hemiplegia and apasia associated with a decreased level of responsiveness. Her symptoms have significantly improved.   Anemia  History of PAF  Previous coumadin therapy  Diabetes  Hypertension  Coronary artery disease S/P CABG  History of a biventricular pacemaker/AICD  A previous CVA and previous TIAs  Cardiomyopathy with a history of congestive heart failure  Microvascular cerebral vascular disease   Dyslipidemia on Zocor  Hospital day # 1  TREATMENT/PLAN  Continue clopidogrel 75 mg orally every day for secondary stroke prevention.  Await hemoglobin A1 C  And 2-D echo.  Needs long term anticoagulation post d/c  Mikey Bussing PA-C [facility] Pager  [phone] 11/12/2012, 6:36 PM  [place] Pain

## 2012-11-12 NOTE — Evaluation (Signed)
Physical Therapy Evaluation Patient Details Name: ROGERS MESSAMORE MRN: [id number] DOB: 1951-10-20 Today's Date: 11/12/2012 Time: [id number] PT Time Calculation (min): 24 min  PT Assessment / Plan / Recommendation Clinical Impression  Pt admitted with increased left sided weakness, pt with history of strokes with L sided weakness. Evaluation limited secondary to pt with feeling of dizziness, RN present. Pt will benefit from skilled PT in the acute care setting in order to maximize functional mobility and safety.     PT Assessment  Patient needs continued PT services    Follow Up Recommendations  Outpatient PT;Supervision/Assistance - 24 hour    Does the patient have the potential to tolerate intense rehabilitation      Barriers to Discharge        Equipment Recommendations  None recommended by PT    Recommendations for Other Services     Frequency Min 4X/week    Precautions / Restrictions Precautions Precautions: Fall Restrictions Weight Bearing Restrictions: No   Pertinent Vitals/Pain No complaints of pain. Vital WFL      Mobility  Bed Mobility Bed Mobility: Supine to Sit;Sitting - Scoot to Edge of Bed;Sit to Supine Supine to Sit: 5: Supervision;With rails;HOB elevated Sitting - Scoot to Edge of Bed: 5: Supervision Sit to Supine: 5: Supervision;HOB elevated Transfers Transfers: Sit to Stand;Stand to Sit Sit to Stand: 4: Min guard;From bed Stand to Sit: 4: Min guard;To bed Details for Transfer Assistance: min guard for safety Ambulation/Gait Ambulation/Gait Assistance: 4: Min assist Ambulation Distance (Feet): 50 Feet Assistive device: 1 person hand held assist Ambulation/Gait Assistance Details: Min assist for stability as pt with decreased balance and LLE decreased strength Gait Pattern: Decreased stance time - left;Decreased step length - right;Narrow base of support Gait velocity: slow Modified Rankin (Stroke Patients Only) Pre-Morbid Rankin Score: Slight  disability Modified Rankin: Moderately severe disability    Shoulder Instructions     Exercises     PT Diagnosis: Difficulty walking  PT Problem List: Decreased activity tolerance;Decreased mobility;Decreased balance;Decreased knowledge of use of DME;Decreased safety awareness;Decreased knowledge of precautions PT Treatment Interventions: DME instruction;Gait training;Stair training;Functional mobility training;Therapeutic activities;Balance training;Neuromuscular re-education;Patient/family education   PT Goals Acute Rehab PT Goals PT Goal Formulation: With patient/family Time For Goal Achievement: 11/19/12 Potential to Achieve Goals: Good Pt will go Sit to Stand: with supervision PT Goal: Sit to Stand - Progress: Goal set today Pt will go Stand to Sit: with supervision PT Goal: Stand to Sit - Progress: Goal set today Pt will Transfer Bed to Chair/Chair to Bed: with supervision PT Transfer Goal: Bed to Chair/Chair to Bed - Progress: Goal set today Pt will Ambulate: >150 feet;with supervision;with least restrictive assistive device PT Goal: Ambulate - Progress: Goal set today Pt will Go Up / Down Stairs: Flight;with rail(s);with supervision PT Goal: Up/Down Stairs - Progress: Goal set today Additional Goals Additional Goal #1: Pt will score >19 on the DGI indicating pt at a decreased fall risk  Visit Information  Last PT Received On: 11/12/12 Assistance Needed: +1 PT/OT Co-Evaluation/Treatment: Yes    Subjective Data      Prior Functioning  Home Living Lives With: Spouse Available Help at Discharge: Family;Available 24 hours/day Type of Home: House Home Access: Stairs to enter [employer] of Steps: 13 Entrance Stairs-Rails: Left (wall on other side) Home Layout: One level Bathroom Shower/Tub: Walk-in shower;Door [employer] Toilet: Standard Bathroom Accessibility: Yes How Accessible: Accessible via walker Home Adaptive Equipment: None Prior Function Level  of Independence: Independent Able to Take Stairs?:  Yes Driving: Yes Vocation: On disability Communication Communication: No difficulties Dominant Hand: Right    Cognition  Overall Cognitive Status: Appears within functional limits for tasks assessed/performed Arousal/Alertness: Awake/alert Orientation Level: Appears intact for tasks assessed Behavior During Session: [facility] for tasks performed    Extremity/Trunk Assessment Right Upper Extremity Assessment RUE ROM/Strength/Tone: Within functional levels Left Upper Extremity Assessment LUE ROM/Strength/Tone: Deficits LUE ROM/Strength/Tone Deficits: Grip and shoulder flexion 3+/5.  Elbow extension and shoulder flexion/extension 3/5.  LUE Sensation: Deficits LUE Sensation Deficits: decreased sensation to light touch LUE Coordination: Deficits LUE Coordination Deficits: increased time and effort for [facility] and [place]   Balance    End of Session PT - End of Session Equipment Utilized During Treatment: Gait belt Activity Tolerance: Patient limited by fatigue Patient left: in bed;with call bell/phone within reach;with family/visitor present Nurse Communication: Mobility status  GP     Ambrose Finland 11/12/2012, 4:27 PM  11/12/2012 Ambrose Finland DPT PAGER: [phone] OFFICE: [phone]

## 2012-11-13 ENCOUNTER — Inpatient Hospital Stay (HOSPITAL_COMMUNITY): Payer: 59

## 2012-11-13 ENCOUNTER — Ambulatory Visit (INDEPENDENT_AMBULATORY_CARE_PROVIDER_SITE_OTHER): Payer: 59 | Admitting: *Deleted

## 2012-11-13 DIAGNOSIS — I428 Other cardiomyopathies: Secondary | ICD-10-CM

## 2012-11-13 DIAGNOSIS — F411 Generalized anxiety disorder: Secondary | ICD-10-CM

## 2012-11-13 DIAGNOSIS — Z95810 Presence of automatic (implantable) cardiac defibrillator: Secondary | ICD-10-CM

## 2012-11-13 DIAGNOSIS — I5022 Chronic systolic (congestive) heart failure: Secondary | ICD-10-CM

## 2012-11-13 LAB — BASIC METABOLIC PANEL
CO2: 30 mEq/L (ref 19–32)
Chloride: 100 mEq/L (ref 96–112)
GFR calc Af Amer: 41 mL/min — ABNORMAL LOW (ref >90)
Potassium: 2.9 mEq/L — ABNORMAL LOW (ref 3.5–5.1)
Sodium: 140 mEq/L (ref 135–145)

## 2012-11-13 LAB — GLUCOSE, CAPILLARY
Glucose-Capillary: 121 mg/dL — ABNORMAL HIGH (ref 70–99)
Glucose-Capillary: 147 mg/dL — ABNORMAL HIGH (ref 70–99)

## 2012-11-13 MED ORDER — POTASSIUM CHLORIDE CRYS ER 20 MEQ PO TBCR
40.0000 meq | EXTENDED_RELEASE_TABLET | Freq: Once | ORAL | Status: AC
  Administered 2012-11-13: 40 meq via ORAL
  Filled 2012-11-13: qty 2

## 2012-11-13 NOTE — Progress Notes (Addendum)
Stroke Team Progress Note  HISTORY [name] is an 62 y.o. female with a history of a previous CVA. She was last seen normal at 10 am 11/11/12  by her husband. The patient took a nap and awoke with left sided weakness and difficulty with her speech. EMS was called and the patient was brought to the [facility] ED where she had a stat CT of the head. The patient is outside the window for TPA so a CT angiogram was ordered to evaluate the patient for other treatment options. NIH scale was initially 13; however, her symptoms reversed without treatment in the emergency department. The pt reports a similar episode about 2 months ago but not as severe so she did not seek medical treatment. She had been on coumadin in the past for Afib but this was stopped due to bruising. She is a vegetarian and likes to eat green leafy vegetables. Apparently there was difficulty adjusting her coumadin dose.  She was admitted to [place] for further evaluation and treatment.  SUBJECTIVE Patient feels she is continuing to improve.  OBJECTIVE Most recent Vital Signs: Filed Vitals:   11/12/12 2158 11/13/12 0135 11/13/12 0652 11/13/12 1031  BP:  145/83 123/72 146/67  Pulse:  70 114 68  Temp:  97.8 F (36.6 C) 97.9 F (36.6 C) 98 F (36.7 C)  TempSrc:  Oral Oral Oral  Resp:  16 18 18   Height:      Weight:      SpO2: 96% 100% 96% 98%   CBG (last 3)   Basename 11/13/12 0649 11/12/12 2155 11/12/12 1706  GLUCAP 111* 121* 139*   IV Fluid Intake:     MEDICATIONS    . ALPRAZolam  0.5-1 mg Oral TID  . aspirin  300 mg Rectal Daily   Or  . aspirin  325 mg Oral Daily  . clopidogrel  75 mg Oral Daily  . ezetimibe  10 mg Oral Daily  . fluticasone  2 spray Each Nare Daily  . furosemide  80 mg Oral BID  . heparin  5,000 Units Subcutaneous Q8H  . insulin aspart  0-9 Units Subcutaneous TID WC  . isosorbide mononitrate  30 mg Oral Daily  . loratadine  10 mg Oral Daily  . metoprolol  100 mg Oral BID  . multivitamin  with minerals  1 tablet Oral Daily  . oxybutynin  10 mg Oral QHS  . pantoprazole  40 mg Oral Daily  . potassium chloride SA  20 mEq Oral BID  . ramipril  10 mg Oral BID  . simvastatin  20 mg Oral QHS  . venlafaxine  100 mg Oral QHS  . venlafaxine  200 mg Oral q morning - 10a   PRN:  acetaminophen, acetaminophen, nitroGLYCERIN, ondansetron (ZOFRAN) IV, senna-docusate, sodium chloride, traMADol  Diet:  Carb Control no restrictions on liquids Activity:  Up with assistance DVT Prophylaxis: Subcutaneous heparin  CLINICALLY SIGNIFICANT STUDIES Basic Metabolic Panel:   Lab [id number] 11/12/12 0736  NA 140 143  K 2.9* 4.2  CL 100 103  CO2 30 30  GLUCOSE 119* 119*  BUN 11 14  CREATININE 1.57* 1.59*  CALCIUM 9.1 9.1  MG -- --  PHOS -- --   Liver Function Tests:   Lab 11/11/12 1416  AST 29  ALT 22  ALKPHOS 62  BILITOT 0.5  PROT 7.8  ALBUMIN 4.1   CBC:   Lab 11/12/12 0736 11/11/12 2115 11/11/12 1416  WBC 6.9 6.5 --  NEUTROABS -- --  3.0  HGB 9.8* 10.6* --  HCT 29.4* 30.6* --  MCV 87.5 86.2 --  PLT 196 204 --   Coagulation:   Lab 11/11/12 1416  LABPROT 12.8  INR 0.97   Cardiac Enzymes:   Lab 11/11/12 1417  CKTOTAL --  CKMB --  CKMBINDEX --  TROPONINI <0.30   Urinalysis: No results found for this basename: COLORURINE:2,APPERANCEUR:2,LABSPEC:2,PHURINE:2,GLUCOSEU:2,HGBUR:2,BILIRUBINUR:2,KETONESUR:2,PROTEINUR:2,UROBILINOGEN:2,NITRITE:2,LEUKOCYTESUR:2 in the last 168 hours Lipid Panel    Component Value Date/Time   CHOL 155 11/12/2012 0736   TRIG 209* 11/12/2012 0736   HDL 49 11/12/2012 0736   CHOLHDL 3.2 11/12/2012 0736   VLDL 42* 11/12/2012 0736   LDLCALC 64 11/12/2012 0736   HgbA1C  Lab Results  Component Value Date   HGBA1C 6.6* 11/12/2012    Urine Drug Screen:     Component Value Date/Time   LABOPIA NONE DETECTED 10/21/2010 2354   COCAINSCRNUR NONE DETECTED 10/21/2010 2354   LABBENZ POSITIVE* 10/21/2010 2354   AMPHETMU NONE DETECTED 10/21/2010 2354   THCU  NONE DETECTED 10/21/2010 2354   LABBARB  Value: NONE DETECTED        DRUG SCREEN FOR MEDICAL PURPOSES ONLY.  IF CONFIRMATION IS NEEDED FOR ANY PURPOSE, NOTIFY LAB WITHIN 5 DAYS.        LOWEST DETECTABLE LIMITS FOR URINE DRUG SCREEN Drug Class       Cutoff (ng/mL) Amphetamine      1000 Barbiturate      200 Benzodiazepine   [id number] Tricyclics       [id number] Opiates          300 Cocaine          300 THC              50 10/21/2010 2354    Alcohol Level: No results found for this basename: ETH:2 in the last 168 hours  Ct Angio Head  11/11/2012  No significant intracranial stenosis.     Ct Angio Neck  11/11/2012 Heavily calcified plaque in the left carotid bulb narrowing the lumen by 25% diameter stenosis. No significant right carotid stenosis. No significant vertebral artery stenosis.  Dg Chest 2 View 11/12/2012   Post CABG and pacemaker / AICD. No acute abnormalities.      Ct Head 11/11/2012  Atrophy and chronic microvascular ischemia.  No acute abnormality.    MRI of the brain  Biventricular pacemaker / AICD  MRA of the brain  Biventricular pacemaker / AICD  2D Echocardiogram  -  Pending  Carotid Doppler  - see CT angio of neck  EKG  SR rate 66 with PVCs   Therapy Recommendations - outpatient PT and OT  Physical Exam  --- Pleasant middle-aged lady currently not in distress.Awake alert. Afebrile. Head is nontraumatic. Neck is supple without bruit. Hearing is normal. Cardiac exam no murmur or gallop. Lungs are clear to auscultation. Distal pulses are well felt.  Neurological Exam : Awake alert oriented x 3 normal speech and language.extraocular moments are full range without nystagmus. Fundi were not visualized. Vision acuity and fields appear normal. Mild left lower face asymmetry. Tongue midline. No drift. Mild diminished fine finger movements on left. Orbits right over left upper extremity. Mild left grip weak.. Normal sensation . Normal coordination.  ASSESSMENT [name] is a 62 y.o. female  presenting with left-sided weakness and difficulty speaking.  CT imaging showed atrophy and chronic microvascular ischemia, no acute abnormality. No MRI due to pacer. Due to duration of symptoms, it is felt she had  a right brain stroke that was not seen on initial. Stroke is felt to be embolic secondary to paroxysmal atrial fibrillation. Work up underway. On aspirin 325 mg orally every day and clopidogrel 75 mg orally every day prior to admission. Now on aspirin 325 mg orally every day and clopidogrel 75 mg orally every day for secondary stroke prevention. Patient with initial left hemiplegia and apasia associated with a decreased level of responsiveness. Her symptoms have significantly improved.   Anemia  History of PAF, Previous coumadin therapy but not on PTA due to bruising and vegetarian diet.  Diabetes, HgbA1c 6.6  Hypertension  Coronary artery disease S/P CABG  History of a biventricular pacemaker/AICD  A previous CVA and previous TIAs  Cardiomyopathy with a history of congestive heart failure  Microvascular cerebral vascular disease  Hyperlipidemia, LDL 64, on statin PTA, at goal LDL < 70  Hospital day # 2  TREATMENT/PLAN  Continue aspirin 325 mg orally every day and clopidogrel 75 mg orally every day for secondary stroke prevention. Recommend changing her to a new oral anticoagulant. Will discuss with Dr. Marlou Porch and Dr. Maryland Pink  F/u  2-D echo.  Outpatient PT and OT  Repeat CT to evaluate if stroke now visible  Burnetta Sabin, MSN, RN, ANVP-BC, ANP-BC, GNP-BC [facility] Pager: [phone] 11/13/2012 11:00 AM  I have personally obtained a history, examined the patient, evaluated imaging results, and formulated the assessment and plan of care. I agree with the above.  Antony Contras, MD Medical Director [facility] Pager: [phone] 11/13/2012 1:05 PM

## 2012-11-13 NOTE — Progress Notes (Signed)
[facility] PROGRESS NOTE  [name] [id number] DOB: 04-23-1951 DOA: 11/11/2012 PCP: Reginia Naas, MD  Assessment/Plan: Principal Problem:  *CVA (cerebral infarction) versus TIA: Unable to get MRI because of pacemaker. Have discussed plan with patient,She started on aspirin and Plavix for CAD and might benefit from Pradaxa.  Appreciate cardiology help. For TEE tomorrow. Active Problems:  Chronic systolic heart failure: No evidence of volume overload  Paroxysmal SVT (supraventricular tachycardia)  Coronary artery disease: Stable  Anxiety  Depression  HTN (hypertension) blood pressure stable well controlled  DM (diabetes mellitus) continue sliding scale  CKD (chronic kidney disease) creatinine stable, around 1.57  TIA (transient ischemic attack)  Hemiplegia, unspecified, affecting nondominant side: Transient  Chronic a-fib: Status post ablation and pacemaker. Pacemaker interrogation unremarkable   Code Status: Full code Family Communication: Discussed plan with patient and her daughter who is at bedside Disposition Plan: Likely home tomorrow   Consultants:  Neurology  Cardiology  Procedures:  Echocardiogram done, results pending.  TEE tomorrow  Antibiotics:  None  HPI/Subjective: Patient doing okay. Less numbness today.  Objective: Filed Vitals:   11/13/12 0135 11/13/12 0652 11/13/12 1031 11/13/12 1429  BP: 145/83 123/72 146/67 124/76  Pulse: 70 114 68 98  Temp: 97.8 F (36.6 C) 97.9 F (36.6 C) 98 F (36.7 C) 98.1 F (36.7 C)  TempSrc: Oral Oral Oral Oral  Resp: 16 18 18 18   Height:      Weight:      SpO2: 100% 96% 98% 99%    Intake/Output Summary (Last 24 hours) at 11/13/12 1530 Last data filed at 11/13/12 0135  Gross per 24 hour  Intake    420 ml  Output   1950 ml  Net  -1530 ml   Filed Weights   11/11/12 2048  Weight: 75.3 kg (166 lb 0.1 oz)    Exam:   General:  Alert and oriented x3, no acute  distress  Cardiovascular: Regular rate and rhythm, [id number], soft 2/6 systolic ejection murmur  Respiratory: Clear to auscultation bilaterally  Abdomen: Soft, nontender, nondistended, positive bowel sounds  Extremities: No clubbing or cyanosis, trace pitting edema  Neuro: No focal deficits. Patient complains of subjective symptoms such as tongue numbness  Data Reviewed: Basic Metabolic Panel:  Lab [id number] 11/12/12 0736 11/11/12 2115 11/11/12 1428 11/11/12 1416  NA 140 143 -- 143 140  K 2.9* 4.2 -- 3.7 3.7  CL 100 103 -- 100 98  CO2 30 30 -- -- 30  GLUCOSE 119* 119* -- 131* 136*  BUN 11 14 -- 13 13  CREATININE 1.57* 1.59* 1.55* 1.60* 1.74*  CALCIUM 9.1 9.1 -- -- 9.7  MG -- -- -- -- --  PHOS -- -- -- -- --   Liver Function Tests:  Lab 11/11/12 1416  AST 29  ALT 22  ALKPHOS 62  BILITOT 0.5  PROT 7.8  ALBUMIN 4.1   CBC:  Lab 11/12/12 0736 11/11/12 2115 11/11/12 1428 11/11/12 1416  WBC 6.9 6.5 -- 6.3  NEUTROABS -- -- -- 3.0  HGB 9.8* 10.6* 11.9* 10.6*  HCT 29.4* 30.6* 35.0* 32.5*  MCV 87.5 86.2 -- 87.1  PLT 196 204 -- 219   Cardiac Enzymes:  Lab 11/11/12 1417  CKTOTAL --  CKMB --  CKMBINDEX --  TROPONINI <0.30   CBG:  Lab 11/13/12 1241 11/13/12 0649 11/12/12 2155 11/12/12 1706 11/12/12 1258  GLUCAP 147* 111* 121* 139* 136*     Studies: Ct Angio Head W/cm &/or Wo Cm  11/11/2012  IMPRESSION: No significant intracranial stenosis.   Original Report Authenticated By: Carl Best, M.D.    Dg Chest 2 View  11/12/2012   IMPRESSION: Post CABG and pacemaker / AICD. No acute abnormalities.   Original Report Authenticated By: Lavonia Dana, M.D.    Ct Head Wo Contrast  11/11/2012  IMPRESSION: Atrophy and chronic microvascular ischemia.  No acute abnormality.  Critical Value/emergent results were called by telephone at the time of interpretation on 11/11/2012 at 1450 hours to Dr. Doy Mince, who verbally acknowledged these results.   Original Report Authenticated By:  Carl Best, M.D.    Ct Angio Neck W/cm &/or Wo/cm  11/11/2012    IMPRESSION: Heavily calcified plaque in the left carotid bulb narrowing the lumen by 25% diameter stenosis.  No significant right carotid stenosis.  No significant vertebral artery stenosis.     Scheduled Meds:    . ALPRAZolam  0.5-1 mg Oral TID  . aspirin  300 mg Rectal Daily   Or  . aspirin  325 mg Oral Daily  . clopidogrel  75 mg Oral Daily  . ezetimibe  10 mg Oral Daily  . fluticasone  2 spray Each Nare Daily  . furosemide  80 mg Oral BID  . heparin  5,000 Units Subcutaneous Q8H  . insulin aspart  0-9 Units Subcutaneous TID WC  . isosorbide mononitrate  30 mg Oral Daily  . loratadine  10 mg Oral Daily  . metoprolol  100 mg Oral BID  . multivitamin with minerals  1 tablet Oral Daily  . oxybutynin  10 mg Oral QHS  . pantoprazole  40 mg Oral Daily  . potassium chloride SA  20 mEq Oral BID  . potassium chloride  40 mEq Oral Once  . ramipril  10 mg Oral BID  . simvastatin  20 mg Oral QHS  . venlafaxine  100 mg Oral QHS  . venlafaxine  200 mg Oral q morning - 10a   Continuous Infusions:   Principal Problem:  *CVA (cerebral infarction) Active Problems:  Chronic systolic heart failure  Paroxysmal SVT (supraventricular tachycardia)  Coronary artery disease  Anxiety  Depression  HTN (hypertension)  DM (diabetes mellitus)  CKD (chronic kidney disease)  TIA (transient ischemic attack)  Hemiplegia, unspecified, affecting nondominant side  Chronic a-fib    Time spent: 25 min    [place] [facility] Pager [phone]. If 8PM-8AM, please contact night-coverage at [email or web address], password [facility] 11/13/2012, 3:30 PM  LOS: 2 days

## 2012-11-13 NOTE — Progress Notes (Signed)
Physical Therapy Treatment Patient Details Name: [name] MRN: [id number] DOB: [birth year] Today's Date: 11/13/2012 Time: [id number] PT Time Calculation (min): 14 min  PT Assessment / Plan / Recommendation Comments on Treatment Session  Pt much improved this date. Patient to go for TEE tomorrow and desires to return home depsite being along patient with numerous other supportive friends and family near by. suspect patient to be okay to d/c home. Will perform balance testing tomorrow.    Follow Up Recommendations  Supervision - Intermittent     Does the patient have the potential to tolerate intense rehabilitation     Barriers to Discharge        Equipment Recommendations  None recommended by PT    Recommendations for Other Services    Frequency Min 4X/week   Plan Discharge plan remains appropriate;Frequency remains appropriate    Precautions / Restrictions Precautions Precautions: Fall Restrictions Weight Bearing Restrictions: No   Pertinent Vitals/Pain 0/10     Mobility  Bed Mobility Bed Mobility: Supine to Sit;Sit to Supine Supine to Sit: 7: Independent;HOB elevated Sitting - Scoot to Edge of Bed: 7: Independent Transfers Transfers: Sit to Stand;Stand to Sit Sit to Stand: 6: Modified independent (Device/Increase time);From bed Stand to Sit: 6: Modified independent (Device/Increase time);To bed Details for Transfer Assistance: no LOB/instability Ambulation/Gait Ambulation/Gait Assistance: 4: Min guard Ambulation Distance (Feet): 200 Feet Assistive device: None Ambulation/Gait Assistance Details: no LOB Gait Pattern: Step-through pattern Gait velocity: WFL Stairs: Yes Stairs Assistance: 4: Min assist (via HHA) [place] Details (indicate cue type and reason): minA via HHA Stair Management Technique: One rail Left Number of Stairs: 12  Modified Rankin (Stroke Patients Only) Pre-Morbid Rankin Score: Slight disability Modified Rankin: Moderately  severe disability    Exercises     PT Diagnosis:    PT Problem List:   PT Treatment Interventions:     PT Goals Acute Rehab PT Goals PT Goal: Sit to Stand - Progress: Progressing toward goal PT Goal: Stand to Sit - Progress: Progressing toward goal PT Transfer Goal: Bed to Chair/Chair to Bed - Progress: Progressing toward goal PT Goal: Ambulate - Progress: Progressing toward goal PT Goal: Up/Down Stairs - Progress: Progressing toward goal  Visit Information  Last PT Received On: 11/13/12 Assistance Needed: +1    Subjective Data  Subjective: Pt received sitting up in bed with report "I feel great."   Cognition  Overall Cognitive Status: Appears within functional limits for tasks assessed/performed Arousal/Alertness: Awake/alert Orientation Level: Appears intact for tasks assessed Behavior During Session: [facility] for tasks performed    Balance     End of Session PT - End of Session Equipment Utilized During Treatment: Gait belt Activity Tolerance: Patient tolerated treatment well Patient left: in bed;with call bell/phone within reach;with family/visitor present Nurse Communication: Mobility status   GP     Kingsley Callander 11/13/2012, 4:30 PM  Kittie Plater, PT, DPT Pager #: [phone] Office #: [phone]

## 2012-11-13 NOTE — Progress Notes (Signed)
Occupational Therapy Treatment Patient Details Name: [name] MRN: [id number] DOB: 1951/01/18 Today's Date: 11/13/2012 Time: [id number] OT Time Calculation (min): 12 min  OT Assessment / Plan / Recommendation Comments on Treatment Session Pt has returned to her baseline in ADL, vision, and use of L UE with the exception of tingling in her R UE.  All acute goals are met.  Pt has excellent family support.  Pt wants to go to cardiac rehab upon d/c.  No further OT needs.    Follow Up Recommendations  Supervision - Intermittent;No OT follow up    Barriers to Discharge       Equipment Recommendations  None recommended by OT    Recommendations for Other Services    Frequency Min 3X/week   Plan Discharge plan needs to be updated    Precautions / Restrictions     Pertinent Vitals/Pain No pain    ADL  Grooming: Independent;Wash/dry hands;Teeth care Where Assessed - Grooming: Unsupported standing Toilet Transfer: Independent Armed forces technical officer Method: Sit to Loss adjuster, chartered: Comfort height toilet Toileting - Clothing Manipulation and Hygiene: Independent Where Assessed - Toileting Clothing Manipulation and Hygiene: Sit to stand from 3-in-1 or toilet Transfers/Ambulation Related to ADLs: independent within room and bathroom including retrieving items for ADL, no device. ADL Comments: Pt reports performing her own bathing, dressing, and toileting today. States her L UE is back to normal with the exception of some tingling.    OT Diagnosis:    OT Problem List:   OT Treatment Interventions:     OT Goals ADL Goals Pt Will Perform Grooming: with modified independence;Standing at sink ADL Goal: Grooming - Progress: Met Pt Will Perform Upper Body Dressing: with modified independence;Sitting, chair;Sitting, bed;Unsupported ADL Goal: Upper Body Dressing - Progress: Met Pt Will Perform Lower Body Dressing: with modified independence;Sit to stand from chair;Sit to stand  from bed;Unsupported ADL Goal: Lower Body Dressing - Progress: Met Pt Will Transfer to Toilet: with modified independence;Ambulation;with DME;Comfort height toilet ADL Goal: Toilet Transfer - Progress: Met Arm Goals Additional Arm Goal #1: Pt will independently perform LUE HEP to increase strength and GMC/FMC. Arm Goal: Additional Goal #1 - Progress: Discontinued (comment) Miscellaneous OT Goals Miscellaneous OT Goal #1: Pt will perform dynamic standing balance task >5 min at mod I level. OT Goal: Miscellaneous Goal #1 - Progress: Met (L UE function has returned) Miscellaneous OT Goal #2: Pt will independently use compensatory techniques during functional mobility, including scanning to left side, to locate 5 objects in environment. OT Goal: Miscellaneous Goal #2 - Progress: Discontinued (comment) (vision has returned)  Visit Information  Last OT Received On: 11/13/12 Assistance Needed: +1    Subjective Data      Prior Functioning       Cognition  Overall Cognitive Status: Appears within functional limits for tasks assessed/performed Arousal/Alertness: Awake/alert Orientation Level: Appears intact for tasks assessed Behavior During Session: [facility] for tasks performed    Mobility  Shoulder Instructions Bed Mobility Bed Mobility: Supine to Sit;Sitting - Scoot to Edge of Bed;Sit to Supine Supine to Sit: 7: Independent;HOB flat Sitting - Scoot to Edge of Bed: 7: Independent Transfers Transfers: Sit to Stand;Stand to Sit Sit to Stand: 7: Independent;From bed;From toilet Stand to Sit: 7: Independent;To bed;To toilet       Exercises      Balance     End of Session OT - End of Session Activity Tolerance: Patient tolerated treatment well Patient left: in bed;with call bell/phone within reach;with family/visitor  present  GO     Malka So 11/13/2012, 2:44 PM [phone]

## 2012-11-13 NOTE — Progress Notes (Signed)
  Echocardiogram 2D Echocardiogram has been performed.  Ardelle Balls A 11/13/2012, 3:26 PM

## 2012-11-13 NOTE — Consult Note (Addendum)
Admit date: 11/11/2012 Referring Physician: Dr. Leonie Man Primary Physician Reginia Naas, MD Primary Cardiologist: Dr. Marlou Porch Reason for Consultation: ? AFIB, anticoagulation, ? TEE in setting of stroke.   HPI: 62 year old female former patient of Dr. Melvern Banker and Dr. Leonia Reeves with biventricular ICD followed by Dr. Lovena Le who was admitted with weakness, difficulty with her speech, left-sided with symptoms improved rapidly without treatment. She is currently asymptomatic, no evidence of heart failure, no chest pain, no shortness of breath. I spoke to Dr. Leonie Man earlier this morning and he requested interrogation of her defibrillator for any evidence of atrial fibrillation. She gives a history of being on Coumadin in the distant past. I extensively reviewed our records from the office dating back to 2009 when Dr. Leonia Reeves saw originally and she was on Plavix at that point, not on Coumadin. There was no mention of atrial fibrillation in our past medical history. None of her defibrillator monitoring checks demonstrated atrial fibrillation. In asking her about Coumadin, she remembers being on this after Dr. Melvern Banker performed a heart catheterization because of a "blockage in her heart ".   Today, I had Tomi Bamberger with [employer] perform interrogation of her defibrillator. There was no evidence of atrial fibrillation. No evidence of supraventricular tachycardia. There was no defibrillation discharge. She has been maintaining sinus rhythm. No evidence of fluid overload/optivol interrogated.  Currently she is in bed, teary-eyed with her daughter at bedside and I also take care of with hypertension. Dr. Tamala Julian was in the room originally discussing her symptoms with her. Dr. [place] Pink had called him. He was unaware that Dr. Leonie Man had spoken to me.   In review of our records, it was a situation where in the distant past she had discontinued her Plavix for a procedure and during that discontinuation she suffered TIA. She is  very adamant about maintaining her antiplatelet therapy because of this.    PMH:   Past Medical History  Diagnosis Date  . Ischemic cardiomyopathy     status post biventricular ICD placed by DR Edumunds who used to see Dr Melvern Banker here to establish  cardiovascular care.  . Diabetes mellitus   . Hypertension   . Coronary artery disease   . CHF (congestive heart failure)   . Stroke   . MVP (mitral valve prolapse)     Antibiotics not required for procedures  . Asthma   . Anxiety   . Depression   . Hiatal hernia   . Cervical dysplasia   . Fibroid   . Function kidney decreased   . History of shingles     PSH:   Past Surgical History  Procedure Date  . Insert / replace / remove pacemaker     biventricular defibrillator--06/10/ 2009  . Abdominal hysterectomy     TAH   . Cardiac defibrillator placement   . Cardiac bypass surg     Triple  . Breast surgery     Breast Bx-benign_Left breast lump removed  . Cardiac catheterization   . Colposcopy    Allergies:  Codeine; Digoxin and related; Diltiazem; Latex; Metolazone; Morphine sulfate; Penicillins; Septra ds; Spironolactone; Sulfa drugs cross reactors; Taztia xt; Tetracyclines & related; Ticlid; Verapamil; and Vicodin Prior to Admit Meds:   Prescriptions prior to admission  Medication Sig Dispense Refill  . albuterol (PROVENTIL HFA;VENTOLIN HFA) 108 (90 BASE) MCG/ACT inhaler Inhale 2 puffs into the lungs every 6 (six) hours as needed. For shortness of breath      . ALPRAZolam (XANAX) 1 MG tablet Take 0.5-1 mg  by mouth 3 (three) times daily. Take 1mg  in the morning and 0.5 mg in the afternoon and at night      . Artificial Tear GEL Apply to eye. 2 drops as needed       . aspirin 325 MG EC tablet Take 325 mg by mouth daily.        . clopidogrel (PLAVIX) 75 MG tablet Take 75 mg by mouth daily. 1 tab daily      . conjugated estrogens (PREMARIN) vaginal cream Place vaginally 3 (three) times a week. 0.5 grams in vagina 3 times a week   42.5 g  12  . ezetimibe (ZETIA) 10 MG tablet Take 10 mg by mouth daily.      . fluticasone (FLONASE) 50 MCG/ACT nasal spray Place 2 sprays into the nose daily.        . furosemide (LASIX) 80 MG tablet Take 80 mg by mouth 2 (two) times daily.       . isosorbide mononitrate (IMDUR) 30 MG 24 hr tablet Take 30 mg by mouth daily.      [name] loratadine (CLARITIN) 10 MG tablet Take 10 mg by mouth daily.      . metFORMIN (GLUCOPHAGE) 850 MG tablet Take 850 mg by mouth daily with breakfast.      . metoprolol (LOPRESSOR) 100 MG tablet Take 100 mg by mouth 2 (two) times daily. 1 tab twice a day      . Multiple Vitamin (MULTIVITAMIN WITH MINERALS) TABS Take 1 tablet by mouth daily.      . nitroGLYCERIN (NITROSTAT) 0.4 MG SL tablet Place 0.4 mg under the tongue every 5 (five) minutes as needed. For chest pain      . ondansetron (ZOFRAN) 8 MG tablet Take 8 mg by mouth every 8 (eight) hours as needed. For nausea      . oxybutynin (DITROPAN-XL) 10 MG 24 hr tablet Take 10 mg by mouth at bedtime. 1 tab daily prn      . pantoprazole (PROTONIX) 40 MG tablet Take 40 mg by mouth daily.      . potassium chloride SA (K-DUR,KLOR-CON) 20 MEQ tablet Take 20 mEq by mouth 2 (two) times daily. 1 tab twice a day      . ramipril (ALTACE) 10 MG capsule Take 10 mg by mouth 2 (two) times daily.       . simvastatin (ZOCOR) 20 MG tablet Take 20 mg by mouth at bedtime. 1 tab daily      . sodium chloride (MURO 128) 5 % ophthalmic ointment Place 1 drop into both eyes daily as needed. For dry eyes      . traMADol (ULTRAM) 50 MG tablet Take 50 mg by mouth every 6 (six) hours as needed. For pain      . venlafaxine (EFFEXOR) 100 MG tablet Take 300 mg by mouth every morning. 2 tabs am 1 tab in pm       Fam HX:    Family History  Problem Relation Age of Onset  . Heart disease Mother   . Hypertension Mother   . Heart disease Father   . Hypertension Father   . Diabetes Father   . Kidney failure Father   . Heart disease Brother   .  Diabetes Brother   . Kidney failure Brother   . Diabetes Paternal Grandmother    Social HX:    History   Social History  . Marital Status: Married    Spouse Name: N/A  Number of Children: N/A  . Years of Education: N/A   Occupational History  . Not on file.   Social History Main Topics  . Smoking status: Never Smoker   . Smokeless tobacco: Not on file  . Alcohol Use: Yes     Comment: rare  . Drug Use: Not on file  . Sexually Active: Yes    Birth Control/ Protection: Surgical   Other Topics Concern  . Not on file   Social History Narrative  . No narrative on file     ROS:  Strokelike symptoms as described above. No chest pain, no syncope, no orthopnea, no PND, no rashes All 11 ROS were addressed and are negative except what is stated in the HPI  Physical Exam: Blood pressure 146/67, pulse 68, temperature 98 F (36.7 C), temperature source Oral, resp. rate 18, height 5\' 4"  (1.626 m), weight 75.3 kg (166 lb 0.1 oz), SpO2 98.00%.    General: Well developed, well nourished, in no acute distress, appears depressed, crying Head: Eyes PERRLA, No xanthomas.   Normal cephalic and atramatic  Lungs:   Clear bilaterally to auscultation and percussion. Normal respiratory effort. No wheezes, no rales. Heart:   HRRR S1 S2 Pulses are 2+ & equal. No significant murmurs appreciated    No carotid bruit. No JVD.  No abdominal bruits.  Abdomen: Bowel sounds are positive, abdomen soft and non-tender without masses. No hepatosplenomegaly. Msk:  Back normal. Normal strength and tone for age. Extremities:   No clubbing, cyanosis or edema.  DP +1 Neuro: Alert and oriented X 3, non-focal currently , MAE x 4, mild left grip weekend per neurology extensive exam.  GU: Deferred Rectal: Deferred Psych:  Good affect, responds appropriately    Labs:   Lab Results  Component Value Date   WBC 6.9 11/12/2012   HGB 9.8* 11/12/2012   HCT 29.4* 11/12/2012   MCV 87.5 11/12/2012   PLT 196 11/12/2012      Lab 11/13/12 0745 11/11/12 1416  NA 140 --  K 2.9* --  CL 100 --  CO2 30 --  BUN 11 --  CREATININE 1.57* --  CALCIUM 9.1 --  PROT -- 7.8  BILITOT -- 0.5  ALKPHOS -- 62  ALT -- 22  AST -- 29  GLUCOSE 119* --   No results found for this basename: PTT   Lab Results  Component Value Date   INR 0.97 11/11/2012   INR 0.99 10/22/2010   INR 0.94 10/21/2010   Lab Results  Component Value Date   CKTOTAL 53 10/22/2010   CKMB 0.7 10/22/2010   TROPONINI <0.30 11/11/2012     Lab Results  Component Value Date   CHOL 155 11/12/2012   CHOL  Value: 157        ATP III CLASSIFICATION:  <200     mg/dL   Desirable  200-239  mg/dL   Borderline High  >=240    mg/dL   High        10/22/2010   Lab Results  Component Value Date   HDL 49 11/12/2012   HDL 43 10/22/2010   Lab Results  Component Value Date   LDLCALC 64 11/12/2012   LDLCALC  Value: 62        Total Cholesterol/HDL:CHD Risk Coronary Heart Disease Risk Table                     Men   Women  1/2 Average Risk   3.4  3.3  Average Risk       5.0   4.4  2 X Average Risk   9.6   7.1  3 X Average Risk  23.4   11.0        Use the calculated Patient Ratio above and the CHD Risk Table to determine the patient's CHD Risk.        ATP III CLASSIFICATION (LDL):  <100     mg/dL   Optimal  100-129  mg/dL   Near or Above                    Optimal  130-159  mg/dL   Borderline  160-189  mg/dL   High  >190     mg/dL   Very High 10/22/2010   Lab Results  Component Value Date   TRIG 209* 11/12/2012   TRIG 262* 10/22/2010   Lab Results  Component Value Date   CHOLHDL 3.2 11/12/2012   CHOLHDL 3.7 10/22/2010   No results found for this basename: LDLDIRECT      Radiology:  Ct Angio Head W/cm &/or Wo Cm  11/11/2012  *RADIOLOGY REPORT*  Clinical Data:  Code stroke with left-sided weakness and slurred speech.  Altered mental status.  CT ANGIOGRAPHY HEAD AND NECK  Technique:  Multidetector CT imaging of the head and neck was performed using the standard protocol  during bolus administration of intravenous contrast.  Multiplanar CT image reconstructions including MIPs were obtained to evaluate the vascular anatomy. Carotid stenosis measurements (when applicable) are obtained utilizing NASCET criteria, using the distal internal carotid diameter as the denominator.  Contrast: 85mL OMNIPAQUE IOHEXOL 350 MG/ML SOLN  Comparison:  CT head 11/11/2012  CTA NECK  Findings:  Lung apices are clear.  No acute bony abnormality in the cervical spine.  No mass or adenopathy is detected in the neck.  Right carotid:  Right common carotid artery is widely patent. Small calcified plaque in the carotid bulb on the right without significant stenosis.  No evidence of dissection.  Left carotid:  Mild calcified plaque in the mid left common carotid artery without significant stenosis.  Large calcified plaque involving the left carotid bulb.  There is 25% narrowing of the lumen of the left internal carotid artery.  Left external carotid artery is widely patent.  Both vertebral arteries are patent to the basilar without stenosis.   Review of the MIP images confirms the above findings.  IMPRESSION: Heavily calcified plaque in the left carotid bulb narrowing the lumen by 25% diameter stenosis.  No significant right carotid stenosis.  No significant vertebral artery stenosis.  CTA HEAD  Findings:  Postcontrast imaging of the brain reveals normal enhancement.  No acute infarct or hemorrhage.  Ventricles are normal in size.  Both vertebral arteries are patent to the basilar.  The basilar is widely patent.  PICA is patent bilaterally.  Superior cerebellar and posterior cerebral arteries are patent bilaterally.  Atherosclerotic calcification in the cavernous carotid bilaterally without significant stenosis.  Anterior and middle cerebral arteries are patent bilaterally without significant stenosis.  Negative for cerebral aneurysm.   Review of the MIP images confirms the above findings.  IMPRESSION: No  significant intracranial stenosis.   Original Report Authenticated By: Carl Best, M.D.    Dg Chest 2 View  11/12/2012  *RADIOLOGY REPORT*  Clinical Data: Stroke, shortness of breath, history hypertension, diabetes, pacemaker  CHEST - 2 VIEW  Comparison: 10/21/2010  Findings: Left subclavian transvenous pacemaker / AICD leads  project over right atrium, right ventricle and coronary sinus. Upper normal heart size post CABG. Tortuous aorta. Pulmonary vascularity normal. Lungs clear. Portion of left upper lobe is obscured on the AP view by the pacemaker generator. No pleural effusion or pneumothorax. No acute osseous findings.  IMPRESSION: Post CABG and pacemaker / AICD. No acute abnormalities.   Original Report Authenticated By: Lavonia Dana, M.D.    Ct Head Wo Contrast  11/11/2012  *RADIOLOGY REPORT*  Clinical Data: Code stroke with left-sided weakness and slurred speech.  CT HEAD WITHOUT CONTRAST  Technique:  Contiguous axial images were obtained from the base of the skull through the vertex without contrast.  Comparison: CT 10/22/2010  Findings: Mild generalized atrophy.  Mild to moderate chronic microvascular ischemia in the white matter.  This is unchanged from the prior study.  No acute infarct.  Negative for hemorrhage or mass.  Calvarium is intact.  IMPRESSION: Atrophy and chronic microvascular ischemia.  No acute abnormality.  Critical Value/emergent results were called by telephone at the time of interpretation on 11/11/2012 at 1450 hours to Dr. Doy Mince, who verbally acknowledged these results.   Original Report Authenticated By: Carl Best, M.D.    Ct Angio Neck W/cm &/or Wo/cm  11/11/2012  *RADIOLOGY REPORT*  Clinical Data:  Code stroke with left-sided weakness and slurred speech.  Altered mental status.  CT ANGIOGRAPHY HEAD AND NECK  Technique:  Multidetector CT imaging of the head and neck was performed using the standard protocol during bolus administration of intravenous contrast.  Multiplanar CT  image reconstructions including MIPs were obtained to evaluate the vascular anatomy. Carotid stenosis measurements (when applicable) are obtained utilizing NASCET criteria, using the distal internal carotid diameter as the denominator.  Contrast: 27mL OMNIPAQUE IOHEXOL 350 MG/ML SOLN  Comparison:  CT head 11/11/2012  CTA NECK  Findings:  Lung apices are clear.  No acute bony abnormality in the cervical spine.  No mass or adenopathy is detected in the neck.  Right carotid:  Right common carotid artery is widely patent. Small calcified plaque in the carotid bulb on the right without significant stenosis.  No evidence of dissection.  Left carotid:  Mild calcified plaque in the mid left common carotid artery without significant stenosis.  Large calcified plaque involving the left carotid bulb.  There is 25% narrowing of the lumen of the left internal carotid artery.  Left external carotid artery is widely patent.  Both vertebral arteries are patent to the basilar without stenosis.   Review of the MIP images confirms the above findings.  IMPRESSION: Heavily calcified plaque in the left carotid bulb narrowing the lumen by 25% diameter stenosis.  No significant right carotid stenosis.  No significant vertebral artery stenosis.  CTA HEAD  Findings:  Postcontrast imaging of the brain reveals normal enhancement.  No acute infarct or hemorrhage.  Ventricles are normal in size.  Both vertebral arteries are patent to the basilar.  The basilar is widely patent.  PICA is patent bilaterally.  Superior cerebellar and posterior cerebral arteries are patent bilaterally.  Atherosclerotic calcification in the cavernous carotid bilaterally without significant stenosis.  Anterior and middle cerebral arteries are patent bilaterally without significant stenosis.  Negative for cerebral aneurysm.   Review of the MIP images confirms the above findings.  IMPRESSION: No significant intracranial stenosis.   Original Report Authenticated By: Carl Best, M.D.    Personally viewed.  EKG:  Sinus rhythm with ventricular pacing  Personally viewed.   ASSESSMENT/PLAN:   62 year old female with  biventricular ICD, normal function, prior cardiomyopathy with most recent ejection fraction of 45% here with symptoms of acute stroke.  -ICD interrogation-no evidence of atrial fibrillation or SVT. Discussed personally with [employer] representative, Tomi Bamberger. Prior EF 45%. Given that there is no evidence of atrial fibrillation surrounding this event, I would suggest proceeding with transesophageal echocardiogram tomorrow morning to exclude possible embolic source/thrombus. There is no indication at this point for anticoagulation. For now, continue with dual antiplatelet therapy as directed by Dr. Leonie Man of neurology. Has prior history of TIAs.  - LDL 64, on statin at goal - Coronary artery disease -Bypass surgery, asymptomatic, no angina - Ischemic cardiomyopathy-currently well compensated. Continue with current medications. - There is no evidence of ICD discharge.  -Hypokalemia - I will give extra 7meq of KCL.    I will make her n.p.o. past midnight for TEE tomorrow morning. We've discussed risks and benefits including esophageal damage.  Candee Furbish, MD  11/13/2012  1:17 PM

## 2012-11-14 ENCOUNTER — Encounter: Payer: Self-pay | Admitting: Internal Medicine

## 2012-11-14 ENCOUNTER — Encounter (HOSPITAL_COMMUNITY)
Admission: EM | Disposition: A | Discharge status: Home / Self Care | Payer: Self-pay | Source: Home / Self Care | Attending: Internal Medicine

## 2012-11-14 ENCOUNTER — Encounter (HOSPITAL_COMMUNITY): Payer: Self-pay | Admitting: *Deleted

## 2012-11-14 DIAGNOSIS — I10 Essential (primary) hypertension: Secondary | ICD-10-CM

## 2012-11-14 HISTORY — PX: TEE WITHOUT CARDIOVERSION: SHX5443

## 2012-11-14 LAB — GLUCOSE, CAPILLARY: Glucose-Capillary: 147 mg/dL — ABNORMAL HIGH (ref 70–99)

## 2012-11-14 SURGERY — ECHOCARDIOGRAM, TRANSESOPHAGEAL
Anesthesia: Moderate Sedation

## 2012-11-14 MED ORDER — MIDAZOLAM HCL 10 MG/2ML IJ SOLN
INTRAMUSCULAR | Status: DC | PRN
  Administered 2012-11-14: 2 mg via INTRAVENOUS
  Administered 2012-11-14: 3 mg via INTRAVENOUS

## 2012-11-14 MED ORDER — FENTANYL CITRATE 0.05 MG/ML IJ SOLN
INTRAMUSCULAR | Status: AC
  Filled 2012-11-14: qty 2

## 2012-11-14 MED ORDER — SODIUM CHLORIDE 0.9 % IV SOLN
INTRAVENOUS | Status: DC
  Administered 2012-11-14: 500 mL via INTRAVENOUS

## 2012-11-14 MED ORDER — LIDOCAINE VISCOUS 2 % MT SOLN
OROMUCOSAL | Status: DC | PRN
  Administered 2012-11-14: 20 mL via OROMUCOSAL

## 2012-11-14 MED ORDER — DIPHENHYDRAMINE HCL 50 MG/ML IJ SOLN
INTRAMUSCULAR | Status: DC | PRN
  Administered 2012-11-14: 25 mg via INTRAVENOUS

## 2012-11-14 MED ORDER — GLIPIZIDE 5 MG PO TABS: 2.5000 mg | ORAL_TABLET | Freq: Every day | ORAL | Status: DC

## 2012-11-14 MED ORDER — BUTAMBEN-TETRACAINE-BENZOCAINE 2-2-14 % EX AERO
INHALATION_SPRAY | CUTANEOUS | Status: DC | PRN
  Administered 2012-11-14: 2 via TOPICAL

## 2012-11-14 MED ORDER — MIDAZOLAM HCL 5 MG/ML IJ SOLN
INTRAMUSCULAR | Status: AC
  Filled 2012-11-14: qty 2

## 2012-11-14 MED ORDER — LIDOCAINE VISCOUS 2 % MT SOLN
OROMUCOSAL | Status: AC
  Filled 2012-11-14: qty 15

## 2012-11-14 MED ORDER — FENTANYL CITRATE 0.05 MG/ML IJ SOLN
INTRAMUSCULAR | Status: DC | PRN
  Administered 2012-11-14: 25 ug via INTRAVENOUS
  Administered 2012-11-14: 50 ug via INTRAVENOUS

## 2012-11-14 MED ORDER — DIPHENHYDRAMINE HCL 50 MG/ML IJ SOLN
INTRAMUSCULAR | Status: AC
  Filled 2012-11-14: qty 1

## 2012-11-14 NOTE — Progress Notes (Signed)
SLP Cancellation Note  Patient Details Name: [name] MRN: [id number] DOB: 1950/12/10   Cancelled treatment:       Reason Eval/Treat Not Completed: Fatigue/lethargy limiting ability to participate. Lethargic after TEE today. Will f/u 1/8 although appears from chart review that patient has returned to baseline.  [place], CCC-SLP [phone]    Joe Gee Meryl 11/14/2012, 3:08 PM

## 2012-11-14 NOTE — Progress Notes (Signed)
  Echocardiogram Echocardiogram Transesophageal has been performed.  Philipp Deputy 11/14/2012, 3:14 PM

## 2012-11-14 NOTE — Discharge Summary (Signed)
Physician Discharge Summary  [name] [id number] DOB: December 05, 1950 DOA: 11/11/2012  PCP: Reginia Naas, MD  Admit date: 11/11/2012 Discharge date: 11/14/2012  Time spent: 25 minutes  Recommendations for Outpatient Follow-up:  1. Outpatient physical therapy 2. Patient will followup with her PCP in the next one month 3. She'll follow up with cardiology in the next one month  Discharge Diagnoses:  Principal Problem:  *CVA (cerebral infarction) Active Problems:  Chronic systolic heart failure  Paroxysmal SVT (supraventricular tachycardia)  Coronary artery disease  Anxiety  Depression  HTN (hypertension)  DM (diabetes mellitus)  CKD (chronic kidney disease)  TIA (transient ischemic attack)  Hemiplegia, unspecified, affecting nondominant side   Discharge Condition: Improved from being discharged home  Diet recommendation: Low-sodium heart healthy  Filed Weights   11/11/12 2048  Weight: 75.3 kg (166 lb 0.1 oz)    History of present illness:  Patient is a 62 year old African American female with a past medical history of ischemic cardiomyopathy with resultant chronic systolic heart failure, history of SVT status post ablation, status post AICD placement, diabetes, chronic kidney disease, hypertension, claims to have had numerous TIAs in the past (not clear if she has had a CVA) presented to the hospital today with the above-noted complaints. The patient she was in her usual state of health, around 10 AM this morning she was noted to have left upper and lower extremity weakness associated with dysarthria. She was brought to [facility] by EMS, code stroke was called by the ED physician. CT of the head was negative for any acute abnormalities, patient rapidly improved during the stay here in the hospital as a result patient was not given any TPA. Neurology assessed the patient, and subsequently ordered a CTA of the neck and the brain which did not show any  significant intracranial stenosis, it did show around 25% stenosis of the left carotid bulb. The patient had rapid improvement or symptoms back to near baseline, hospitalists were called for further evaluation and treatment of risk stratification  Hospital Course:   Active Problems:  Chronic systolic/diastolic heart failure: BNP checked. No signs of volume overload. Patient will continue home medications   Paroxysmal SVT (supraventricular tachycardia): Stable during this hospitalization   Coronary artery disease: Stable during this hospitalization   Anxiety: Possible some of her neurologic complaints were more related to anxiety and stress. Continue to monitor.   Depression: Stable   HTN (hypertension): Stable. Blood pressure well-controlled.   DM (diabetes mellitus): Continue sliding scale. Metformin was held and am recommending discontinuation of this because of her heart failure. I put her on very low dose glipizide 2.5 mg by mouth daily her A1c was checked and found to be 6.6   CKD (chronic kidney disease): Paced   TIA (transient ischemic attack): There was a concern as to whether patient was having recurrent episodes of atrial fibrillation which was leading to recurrent TIA-like symptoms. Patient has a history of pacemaker after she is status post ablation. She is on aspirin and Plavix for CAD. Pacemaker was interrogated on no evidence of atrial fibrillation. 2-D echo and Dopplers were done which showed no signs of any embolic source. After discussion with cardiology, patient underwent TEE on 1/7. This was unremarkable for any embolic phenomenon and it was agreed that patient should not be on Pradaxa given mildly elevated creatinine and bleeding risk outweighing the benefits. There is a small suspicion that some of her symptoms could be more related to distress or anxiety. Patient will  continue on aspirin and Plavix for CAD as well as secondary stroke prevention   Procedures:  Status  post TEE: Negative bubble study, mild MR/TR, no thrombus, EF 40-45%  2-D echo done 1/6: Grade 2 diastolic dysfunction, decreased ejection fraction of 40-45%    Consultations:  [facility]  [facility]  Discharge Exam: Filed Vitals:   11/14/12 1150 11/14/12 1200 11/14/12 1206 11/14/12 1339  BP: 144/94 143/86 133/81 140/84  Pulse:    72  Temp:    97.9 F (36.6 C)  TempSrc:    Oral  Resp: 11 11 12 17   Height:      Weight:      SpO2: 98% 98% 93% 97%    General: Alert and oriented x3, no acute distress Cardiovascular: Currently regular rate and rhythm, [id number], soft 2/6 systolic ejection murmur clear auscultation bilaterally Abdomen: Soft, nontender, nondistended, positive bowel sounds Extremities: No clubbing or cyanosis, trace pitting edema Respiratory: Clear to auscultation bilaterally  Discharge Instructions  Discharge Orders    Future Orders Please Complete By Expires   Diet - low sodium heart healthy      Increase activity slowly          Medication List     As of 11/14/2012  3:17 PM    TAKE these medications         albuterol 108 (90 BASE) MCG/ACT inhaler   Commonly known as: PROVENTIL HFA;VENTOLIN HFA   Inhale 2 puffs into the lungs every 6 (six) hours as needed. For shortness of breath      ALPRAZolam 1 MG tablet   Commonly known as: XANAX   Take 0.5-1 mg by mouth 3 (three) times daily. Take 1mg  in the morning and 0.5 mg in the afternoon and at night      Artificial Tear Gel   Apply to eye. 2 drops as needed      aspirin 325 MG EC tablet   Take 325 mg by mouth daily.      clopidogrel 75 MG tablet   Commonly known as: PLAVIX   Take 75 mg by mouth daily. 1 tab daily      conjugated estrogens vaginal cream   Commonly known as: PREMARIN   Place vaginally 3 (three) times a week. 0.5 grams in vagina 3 times a week      ezetimibe 10 MG tablet   Commonly known as: ZETIA   Take 10 mg by mouth daily.      fluticasone 50 MCG/ACT  nasal spray   Commonly known as: FLONASE   Place 2 sprays into the nose daily.      furosemide 80 MG tablet   Commonly known as: LASIX   Take 80 mg by mouth 2 (two) times daily.      isosorbide mononitrate 30 MG 24 hr tablet   Commonly known as: IMDUR   Take 30 mg by mouth daily.      loratadine 10 MG tablet   Commonly known as: CLARITIN   Take 10 mg by mouth daily.                  metoprolol 100 MG tablet   Commonly known as: LOPRESSOR   Take 100 mg by mouth 2 (two) times daily. 1 tab twice a day      multivitamin with minerals Tabs   Take 1 tablet by mouth daily.      nitroGLYCERIN 0.4 MG SL tablet   Commonly known as: NITROSTAT  Place 0.4 mg under the tongue every 5 (five) minutes as needed. For chest pain      ondansetron 8 MG tablet   Commonly known as: ZOFRAN   Take 8 mg by mouth every 8 (eight) hours as needed. For nausea      oxybutynin 10 MG 24 hr tablet   Commonly known as: DITROPAN-XL   Take 10 mg by mouth at bedtime. 1 tab daily prn      pantoprazole 40 MG tablet   Commonly known as: PROTONIX   Take 40 mg by mouth daily.      potassium chloride SA 20 MEQ tablet   Commonly known as: K-DUR,KLOR-CON   Take 20 mEq by mouth 2 (two) times daily. 1 tab twice a day      ramipril 10 MG capsule   Commonly known as: ALTACE   Take 10 mg by mouth 2 (two) times daily.      simvastatin 20 MG tablet   Commonly known as: ZOCOR   Take 20 mg by mouth at bedtime. 1 tab daily      sodium chloride 5 % ophthalmic ointment   Commonly known as: MURO 128   Place 1 drop into both eyes daily as needed. For dry eyes      traMADol 50 MG tablet   Commonly known as: ULTRAM   Take 50 mg by mouth every 6 (six) hours as needed. For pain      venlafaxine 100 MG tablet   Commonly known as: EFFEXOR   Take 300 mg by mouth every morning. 2 tabs am 1 tab in pm      Patient should stop the following   metFORMIN 850 MG tablet  Commonly known as: GLUCOPHAGE  Take 850 mg by  mouth daily with breakfast.    New medicine  Glipizide 2.5 mg Take 1/2 tab of a 5mg  pill daily before breakfast      Follow-up Information    Follow up with Reginia Naas, MD. Schedule an appointment as soon as possible for a visit in 1 month. (As needed)    Contact information:   [place] [address] [phone]       Follow up with Candee Furbish, MD. Schedule an appointment as soon as possible for a visit in 1 month. (As needed)    Contact information:   [place] [address] [phone]           The results of significant diagnostics from this hospitalization (including imaging, microbiology, ancillary and laboratory) are listed below for reference.     Dg Chest 2 View  11/12/2012   IMPRESSION: Post CABG and pacemaker / AICD. No acute abnormalities.   Original Report Authenticated By: Lavonia Dana, M.D.    Ct Head Wo Contrast  11/13/2012   IMPRESSION: Stable noncontrast CT appearance of the brain.  No acute / subacute infarct or interval change identified.   Original Report Authenticated By: Roselyn Reef, M.D.    Ct Head Wo Contrast  11/11/2012    IMPRESSION: Atrophy and chronic microvascular ischemia.  No acute abnormality.      Ct Angio Neck W/cm &/or Wo/cm  11/11/2012    IMPRESSION: Heavily calcified plaque in the left carotid bulb narrowing the lumen by 25% diameter stenosis.  No significant right carotid stenosis.  No significant vertebral artery stenosis.    CTA HEAD   IMPRESSION: No significant intracranial stenosis.   Original Report Authenticated By: Carl Best,  M.D.     Microbiology: No results found for this or any previous visit (from the past 240 hour(s)).   Labs: Basic Metabolic Panel:  Lab [id number] 11/12/12 0736 11/11/12 2115 11/11/12 1428 11/11/12 1416  NA 140 143 -- 143 140  K 2.9* 4.2 -- 3.7 3.7  CL 100 103 -- 100 98  CO2 30 30 -- -- 30  GLUCOSE 119* 119* -- 131* 136*  BUN 11 14 -- 13 13    CREATININE 1.57* 1.59* 1.55* 1.60* 1.74*  CALCIUM 9.1 9.1 -- -- 9.7  MG -- -- -- -- --  PHOS -- -- -- -- --   Liver Function Tests:  Lab 11/11/12 1416  AST 29  ALT 22  ALKPHOS 62  BILITOT 0.5  PROT 7.8  ALBUMIN 4.1   CBC:  Lab 11/12/12 0736 11/11/12 2115 11/11/12 1428 11/11/12 1416  WBC 6.9 6.5 -- 6.3  NEUTROABS -- -- -- 3.0  HGB 9.8* 10.6* 11.9* 10.6*  HCT 29.4* 30.6* 35.0* 32.5*  MCV 87.5 86.2 -- 87.1  PLT 196 204 -- 219   Cardiac Enzymes:  Lab 11/11/12 1417  CKTOTAL --  CKMB --  CKMBINDEX --  TROPONINI <0.30   CBG:  Lab 11/14/12 0644 11/13/12 2131 11/13/12 1700 11/13/12 1241 11/13/12 0649  GLUCAP 133* 106* 137* 147* 111*       Signed:  Rajvir Ernster K  [facility] 11/14/2012, 3:17 PM

## 2012-11-14 NOTE — Progress Notes (Signed)
Patient off floor, in endo, having TEE performed. If unrevealing, ok for discharge from stroke standpoint. New oral anticoagulant vs aspirin and plavix per cardiology recs. Follow up Dr. Leonie Man in 2 mos.  Burnetta Sabin, MSN, RN, ANVP-BC, ANP-BC, GNP-BC [facility] Pager: [phone] 11/14/2012 11:25 AM

## 2012-11-14 NOTE — CV Procedure (Addendum)
Conclusion:  Negative bubble study Mild MR/TR No thrombus EF 40-45%  Discussed with daughter. [name] is still sedate but responds to command at this point. She has maintained BP and airway, O2 sats at all times. Continue with close monitoring in recovery.

## 2012-11-14 NOTE — Interval H&P Note (Signed)
History and Physical Interval Note:  11/14/2012 9:39 AM  [name]  has presented today for surgery, with the diagnosis of stroke  The various methods of treatment have been discussed with the patient and family. After consideration of risks, benefits and other options for treatment, the patient has consented to  Procedure(s) (LRB) with comments: TRANSESOPHAGEAL ECHOCARDIOGRAM (TEE) (N/A) as a surgical intervention .  The patient's history has been reviewed, patient examined, no change in status, stable for surgery.  I have reviewed the patient's chart and labs.  Questions were answered to the patient's satisfaction.     Adoria Kawamoto

## 2012-11-14 NOTE — Progress Notes (Signed)
PT Cancellation Note  Patient Details Name: [name] MRN: [id number] DOB: [birth year]   Cancelled Treatment:    Reason Eval/Treat Not Completed: Other (comment) (patient s/p TEE and anticipated D/C today.)  She reports no concerns about mobility on discharge as she feels close to her baseline.  Interested in cardiac rehab for monitoring during exercise.  Educated needs referral from her MD.   Magda Kiel 11/14/2012, 3:50 PM Magda Kiel, [place] 11/14/2012

## 2012-11-14 NOTE — Progress Notes (Signed)
Subjective:  Smiling this am, no SOB, no CP. Still with some left sided weakness. Took Xanax and slept well.   Objective:  Vital Signs in the last 24 hours: Temp:  [97.6 F (36.4 C)-98.5 F (36.9 C)] 98 F (36.7 C) (01/07 0917) Pulse Rate:  [57-98] 67  (01/07 0917) Resp:  [17-20] 17  (01/07 0917) BP: (124-150)/(67-91) 139/77 mmHg (01/07 0917) SpO2:  [98 %-100 %] 100 % (01/07 0917)  Intake/Output from previous day:     Physical Exam: General: Well developed, well nourished, in no acute distress. Head:  Normocephalic and atraumatic. Lungs: Clear to auscultation and percussion. Heart: Normal S1 and S2.  No murmur, rubs or gallops.  Abdomen: soft, non-tender, positive bowel sounds. Extremities: No clubbing or cyanosis. No edema. Neurologic: Alert and oriented x 3. Mild left sided weakness.     Lab Results:  [facility] 11/12/12 0736 11/11/12 2115  WBC 6.9 6.5  HGB 9.8* 10.6*  PLT 196 204    Basename 11/13/12 0745 11/12/12 0736  NA 140 143  K 2.9* 4.2  CL 100 103  CO2 30 30  GLUCOSE 119* 119*  BUN 11 14  CREATININE 1.57* 1.59*    Basename 11/11/12 1417  TROPONINI <0.30   Hepatic Function Panel  Basename 11/11/12 1416  PROT 7.8  ALBUMIN 4.1  AST 29  ALT 22  ALKPHOS 62  BILITOT 0.5  BILIDIR --  IBILI --    Basename 11/12/12 0736  CHOL 155   No results found for this basename: PROTIME in the last 72 hours  Imaging: Ct Head Wo Contrast  11/13/2012  *RADIOLOGY REPORT*  Clinical Data: 62 year old female Code stroke with left-sided weakness and slurred speech 2 days ago.  CT HEAD WITHOUT CONTRAST  Technique:  Contiguous axial images were obtained from the base of the skull through the vertex without contrast.  Comparison: CTA head and head CT without contrast 11/11/2012.  Findings: Stable paranasal sinuses and mastoids. Stable visualized osseous structures.  Stable and negative orbit and scalp soft tissues.  No ventriculomegaly. No acute intracranial hemorrhage  identified. No midline shift, mass effect, or evidence of mass lesion.  Stable small chronic-appearing lacunar infarct in the posterior right cerebellar hemisphere.  Stable gray-white matter differentiation in the cerebral hemispheres.  Subcortical white matter hypodensity most pronounced in the anterior right frontal lobe.  No acute or subacute cortically based infarct or new hypodense lesion identified.  The No suspicious intracranial vascular hyperdensity. Calcified atherosclerosis at the skull base.  IMPRESSION: Stable noncontrast CT appearance of the brain.  No acute / subacute infarct or interval change identified.   Original Report Authenticated By: Roselyn Reef, M.D.    Personally viewed.   Telemetry: V paced Personally viewed.    Cardiac Studies:  EF 40-45% no change  Assessment/Plan:  Principal Problem:  *CVA (cerebral infarction) Active Problems:  Chronic systolic heart failure  Paroxysmal SVT (supraventricular tachycardia)  Coronary artery disease  Anxiety  Depression  HTN (hypertension)  DM (diabetes mellitus)  CKD (chronic kidney disease)  TIA (transient ischemic attack)  Hemiplegia, unspecified, affecting nondominant side  Chronic a-fib  -Await TEE this am -NPO -Feels better, stronger but still with some left sided weakness -KCL given yesterday -EF no change. No obvious thrombus or embolic source on TTE. Await TEE.   Discussed risks and benefits of TEE including esophageal damage. Daughter in room. Willing to proceed.    SKAINS, [place] 11/14/2012, 9:36 AM

## 2012-11-15 ENCOUNTER — Encounter (HOSPITAL_COMMUNITY): Payer: Self-pay | Admitting: Cardiology

## 2012-11-15 LAB — REMOTE ICD DEVICE
AL IMPEDENCE ICD: 504 Ohm
ATRIAL PACING ICD: 0.56 pct
BAMS-0001: 150 {beats}/min
BATTERY VOLTAGE: 2.95 V
LV LEAD IMPEDENCE ICD: 720 Ohm
TOT-0002: 0
TOT-0006: 20090610000000
TZAT-0001ATACH: 2
TZAT-0004SLOWVT: 8
TZAT-0004SLOWVT: 8
TZAT-0012ATACH: 150 ms
TZAT-0012ATACH: 150 ms
TZAT-0012FASTVT: 200 ms
TZAT-0012SLOWVT: 200 ms
TZAT-0012SLOWVT: 200 ms
TZAT-0018ATACH: NEGATIVE
TZAT-0018FASTVT: NEGATIVE
TZAT-0019ATACH: 6 V
TZAT-0020ATACH: 1.5 ms
TZAT-0020ATACH: 1.5 ms
TZAT-0020FASTVT: 1.5 ms
TZAT-0020SLOWVT: 1.5 ms
TZAT-0020SLOWVT: 1.5 ms
TZON-0003ATACH: 400 ms
TZON-0003SLOWVT: 350 ms
TZON-0003VSLOWVT: 430 ms
TZON-0004VSLOWVT: 36
TZST-0001ATACH: 4
TZST-0001ATACH: 6
TZST-0001FASTVT: 2
TZST-0001FASTVT: 6
TZST-0001SLOWVT: 3
TZST-0001SLOWVT: 5
TZST-0002FASTVT: NEGATIVE
TZST-0003SLOWVT: 35 J
TZST-0003SLOWVT: 35 J
VENTRICULAR PACING ICD: 99.99 pct

## 2012-11-20 ENCOUNTER — Telehealth: Payer: Self-pay | Admitting: Internal Medicine

## 2012-11-20 NOTE — Telephone Encounter (Signed)
Transmission received patient aware and will resend in April.

## 2012-11-20 NOTE — Telephone Encounter (Signed)
Pt was in hospital 11-13-12 so didn't do home transmission, however rep came into hospital and did ck on 11-13-12/pls call and let her know if you received it [phone]

## 2012-11-21 ENCOUNTER — Encounter: Payer: Self-pay | Admitting: *Deleted

## 2012-12-26 ENCOUNTER — Encounter (HOSPITAL_COMMUNITY)
Admission: RE | Admit: 2012-12-26 | Discharge: 2012-12-26 | Disposition: A | Discharge status: Home / Self Care | Payer: Self-pay | Source: Ambulatory Visit | Attending: Cardiology | Admitting: Cardiology

## 2012-12-26 DIAGNOSIS — I10 Essential (primary) hypertension: Secondary | ICD-10-CM | POA: Insufficient documentation

## 2012-12-26 DIAGNOSIS — I509 Heart failure, unspecified: Secondary | ICD-10-CM | POA: Insufficient documentation

## 2012-12-26 DIAGNOSIS — I2510 Atherosclerotic heart disease of native coronary artery without angina pectoris: Secondary | ICD-10-CM | POA: Insufficient documentation

## 2012-12-26 DIAGNOSIS — Z5189 Encounter for other specified aftercare: Secondary | ICD-10-CM | POA: Insufficient documentation

## 2012-12-26 DIAGNOSIS — E119 Type 2 diabetes mellitus without complications: Secondary | ICD-10-CM | POA: Insufficient documentation

## 2012-12-26 DIAGNOSIS — I428 Other cardiomyopathies: Secondary | ICD-10-CM | POA: Insufficient documentation

## 2012-12-26 DIAGNOSIS — Z95810 Presence of automatic (implantable) cardiac defibrillator: Secondary | ICD-10-CM | POA: Insufficient documentation

## 2012-12-26 NOTE — Progress Notes (Signed)
Re-oriented patient to Cardiac Rehab Maintenance Program. Pt tolerated low intensity exercise well, without c/o.

## 2012-12-27 ENCOUNTER — Encounter (HOSPITAL_COMMUNITY)
Admission: RE | Admit: 2012-12-27 | Discharge: 2012-12-27 | Disposition: A | Discharge status: Home / Self Care | Payer: Self-pay | Source: Ambulatory Visit | Attending: Cardiology | Admitting: Cardiology

## 2012-12-29 ENCOUNTER — Encounter (HOSPITAL_COMMUNITY)
Admission: RE | Admit: 2012-12-29 | Discharge: 2012-12-29 | Disposition: A | Discharge status: Home / Self Care | Payer: Self-pay | Source: Ambulatory Visit | Attending: Cardiology | Admitting: Cardiology

## 2013-01-02 ENCOUNTER — Encounter (HOSPITAL_COMMUNITY)
Admission: RE | Admit: 2013-01-02 | Discharge: 2013-01-02 | Disposition: A | Discharge status: Home / Self Care | Payer: Self-pay | Source: Ambulatory Visit | Attending: Cardiology | Admitting: Cardiology

## 2013-01-03 ENCOUNTER — Encounter (HOSPITAL_COMMUNITY)
Admission: RE | Admit: 2013-01-03 | Discharge: 2013-01-03 | Disposition: A | Discharge status: Home / Self Care | Payer: Self-pay | Source: Ambulatory Visit | Attending: Cardiology | Admitting: Cardiology

## 2013-01-05 ENCOUNTER — Encounter (HOSPITAL_COMMUNITY)
Admission: RE | Admit: 2013-01-05 | Discharge: 2013-01-05 | Disposition: A | Discharge status: Home / Self Care | Payer: Self-pay | Source: Ambulatory Visit | Attending: Cardiology | Admitting: Cardiology

## 2013-01-09 ENCOUNTER — Encounter (HOSPITAL_COMMUNITY): Payer: Self-pay

## 2013-01-09 DIAGNOSIS — Z95810 Presence of automatic (implantable) cardiac defibrillator: Secondary | ICD-10-CM | POA: Insufficient documentation

## 2013-01-09 DIAGNOSIS — I428 Other cardiomyopathies: Secondary | ICD-10-CM | POA: Insufficient documentation

## 2013-01-09 DIAGNOSIS — I2510 Atherosclerotic heart disease of native coronary artery without angina pectoris: Secondary | ICD-10-CM | POA: Insufficient documentation

## 2013-01-09 DIAGNOSIS — I10 Essential (primary) hypertension: Secondary | ICD-10-CM | POA: Insufficient documentation

## 2013-01-09 DIAGNOSIS — I509 Heart failure, unspecified: Secondary | ICD-10-CM | POA: Insufficient documentation

## 2013-01-09 DIAGNOSIS — E119 Type 2 diabetes mellitus without complications: Secondary | ICD-10-CM | POA: Insufficient documentation

## 2013-01-09 DIAGNOSIS — Z5189 Encounter for other specified aftercare: Secondary | ICD-10-CM | POA: Insufficient documentation

## 2013-01-10 ENCOUNTER — Encounter (HOSPITAL_COMMUNITY)
Admission: RE | Admit: 2013-01-10 | Discharge: 2013-01-10 | Disposition: A | Discharge status: Home / Self Care | Payer: Self-pay | Source: Ambulatory Visit | Attending: Cardiology | Admitting: Cardiology

## 2013-01-12 ENCOUNTER — Encounter (HOSPITAL_COMMUNITY): Payer: Self-pay

## 2013-01-16 ENCOUNTER — Encounter (HOSPITAL_COMMUNITY)
Admission: RE | Admit: 2013-01-16 | Discharge: 2013-01-16 | Disposition: A | Discharge status: Home / Self Care | Payer: Self-pay | Source: Ambulatory Visit | Attending: Cardiology | Admitting: Cardiology

## 2013-01-17 ENCOUNTER — Encounter (HOSPITAL_COMMUNITY)
Admission: RE | Admit: 2013-01-17 | Discharge: 2013-01-17 | Disposition: A | Discharge status: Home / Self Care | Payer: Self-pay | Source: Ambulatory Visit | Attending: Cardiology | Admitting: Cardiology

## 2013-01-19 ENCOUNTER — Encounter (HOSPITAL_COMMUNITY): Payer: Self-pay

## 2013-01-23 ENCOUNTER — Encounter (HOSPITAL_COMMUNITY): Payer: Self-pay

## 2013-01-24 ENCOUNTER — Encounter (HOSPITAL_COMMUNITY)
Admission: RE | Admit: 2013-01-24 | Discharge: 2013-01-24 | Disposition: A | Discharge status: Home / Self Care | Payer: Self-pay | Source: Ambulatory Visit | Attending: Cardiology | Admitting: Cardiology

## 2013-01-26 ENCOUNTER — Encounter (HOSPITAL_COMMUNITY)
Admission: RE | Admit: 2013-01-26 | Discharge: 2013-01-26 | Disposition: A | Discharge status: Home / Self Care | Payer: Self-pay | Source: Ambulatory Visit | Attending: Cardiology | Admitting: Cardiology

## 2013-01-30 ENCOUNTER — Encounter (HOSPITAL_COMMUNITY): Payer: Self-pay

## 2013-01-31 ENCOUNTER — Encounter (HOSPITAL_COMMUNITY): Payer: Self-pay

## 2013-02-02 ENCOUNTER — Encounter (HOSPITAL_COMMUNITY): Payer: Self-pay

## 2013-02-06 ENCOUNTER — Encounter (HOSPITAL_COMMUNITY): Payer: Self-pay

## 2013-02-06 DIAGNOSIS — Z5189 Encounter for other specified aftercare: Secondary | ICD-10-CM | POA: Insufficient documentation

## 2013-02-06 DIAGNOSIS — I509 Heart failure, unspecified: Secondary | ICD-10-CM | POA: Insufficient documentation

## 2013-02-06 DIAGNOSIS — I10 Essential (primary) hypertension: Secondary | ICD-10-CM | POA: Insufficient documentation

## 2013-02-06 DIAGNOSIS — I2510 Atherosclerotic heart disease of native coronary artery without angina pectoris: Secondary | ICD-10-CM | POA: Insufficient documentation

## 2013-02-06 DIAGNOSIS — I428 Other cardiomyopathies: Secondary | ICD-10-CM | POA: Insufficient documentation

## 2013-02-06 DIAGNOSIS — E119 Type 2 diabetes mellitus without complications: Secondary | ICD-10-CM | POA: Insufficient documentation

## 2013-02-06 DIAGNOSIS — Z95810 Presence of automatic (implantable) cardiac defibrillator: Secondary | ICD-10-CM | POA: Insufficient documentation

## 2013-02-07 ENCOUNTER — Encounter (HOSPITAL_COMMUNITY): Payer: Self-pay

## 2013-02-09 ENCOUNTER — Encounter (HOSPITAL_COMMUNITY): Payer: Self-pay

## 2013-02-12 ENCOUNTER — Ambulatory Visit (INDEPENDENT_AMBULATORY_CARE_PROVIDER_SITE_OTHER): Payer: 59 | Admitting: *Deleted

## 2013-02-12 ENCOUNTER — Other Ambulatory Visit: Payer: Self-pay | Admitting: Internal Medicine

## 2013-02-12 DIAGNOSIS — Z95810 Presence of automatic (implantable) cardiac defibrillator: Secondary | ICD-10-CM

## 2013-02-12 DIAGNOSIS — I5022 Chronic systolic (congestive) heart failure: Secondary | ICD-10-CM

## 2013-02-13 ENCOUNTER — Encounter (HOSPITAL_COMMUNITY)
Admission: RE | Admit: 2013-02-13 | Discharge: 2013-02-13 | Disposition: A | Discharge status: Home / Self Care | Payer: Self-pay | Source: Ambulatory Visit | Attending: Cardiology | Admitting: Cardiology

## 2013-02-14 ENCOUNTER — Observation Stay (HOSPITAL_COMMUNITY)
Admission: EM | Admit: 2013-02-14 | Discharge: 2013-02-15 | DRG: 069 | Disposition: A | Discharge status: Home / Self Care | Payer: 59 | Attending: Internal Medicine | Admitting: Internal Medicine

## 2013-02-14 ENCOUNTER — Encounter (HOSPITAL_COMMUNITY)
Admission: RE | Admit: 2013-02-14 | Discharge: 2013-02-14 | Disposition: A | Discharge status: Home / Self Care | Payer: Self-pay | Source: Ambulatory Visit | Attending: Cardiology | Admitting: Cardiology

## 2013-02-14 ENCOUNTER — Other Ambulatory Visit: Payer: Self-pay

## 2013-02-14 ENCOUNTER — Emergency Department (HOSPITAL_COMMUNITY): Payer: 59

## 2013-02-14 ENCOUNTER — Encounter (HOSPITAL_COMMUNITY): Payer: Self-pay | Admitting: *Deleted

## 2013-02-14 DIAGNOSIS — Z794 Long term (current) use of insulin: Secondary | ICD-10-CM | POA: Insufficient documentation

## 2013-02-14 DIAGNOSIS — Z951 Presence of aortocoronary bypass graft: Secondary | ICD-10-CM | POA: Insufficient documentation

## 2013-02-14 DIAGNOSIS — F329 Major depressive disorder, single episode, unspecified: Secondary | ICD-10-CM | POA: Diagnosis present

## 2013-02-14 DIAGNOSIS — I471 Supraventricular tachycardia, unspecified: Secondary | ICD-10-CM | POA: Diagnosis present

## 2013-02-14 DIAGNOSIS — I25709 Atherosclerosis of coronary artery bypass graft(s), unspecified, with unspecified angina pectoris: Secondary | ICD-10-CM | POA: Diagnosis present

## 2013-02-14 DIAGNOSIS — Z8673 Personal history of transient ischemic attack (TIA), and cerebral infarction without residual deficits: Secondary | ICD-10-CM | POA: Insufficient documentation

## 2013-02-14 DIAGNOSIS — I2589 Other forms of chronic ischemic heart disease: Secondary | ICD-10-CM | POA: Insufficient documentation

## 2013-02-14 DIAGNOSIS — I059 Rheumatic mitral valve disease, unspecified: Secondary | ICD-10-CM | POA: Insufficient documentation

## 2013-02-14 DIAGNOSIS — I498 Other specified cardiac arrhythmias: Secondary | ICD-10-CM | POA: Insufficient documentation

## 2013-02-14 DIAGNOSIS — F32A Depression, unspecified: Secondary | ICD-10-CM | POA: Diagnosis present

## 2013-02-14 DIAGNOSIS — G459 Transient cerebral ischemic attack, unspecified: Principal | ICD-10-CM | POA: Diagnosis present

## 2013-02-14 DIAGNOSIS — I10 Essential (primary) hypertension: Secondary | ICD-10-CM | POA: Diagnosis present

## 2013-02-14 DIAGNOSIS — I341 Nonrheumatic mitral (valve) prolapse: Secondary | ICD-10-CM | POA: Diagnosis present

## 2013-02-14 DIAGNOSIS — F3289 Other specified depressive episodes: Secondary | ICD-10-CM | POA: Insufficient documentation

## 2013-02-14 DIAGNOSIS — R209 Unspecified disturbances of skin sensation: Secondary | ICD-10-CM | POA: Insufficient documentation

## 2013-02-14 DIAGNOSIS — R200 Anesthesia of skin: Secondary | ICD-10-CM

## 2013-02-14 DIAGNOSIS — I6350 Cerebral infarction due to unspecified occlusion or stenosis of unspecified cerebral artery: Secondary | ICD-10-CM

## 2013-02-14 DIAGNOSIS — F411 Generalized anxiety disorder: Secondary | ICD-10-CM | POA: Insufficient documentation

## 2013-02-14 DIAGNOSIS — I5022 Chronic systolic (congestive) heart failure: Secondary | ICD-10-CM | POA: Insufficient documentation

## 2013-02-14 DIAGNOSIS — Z95810 Presence of automatic (implantable) cardiac defibrillator: Secondary | ICD-10-CM | POA: Insufficient documentation

## 2013-02-14 DIAGNOSIS — I129 Hypertensive chronic kidney disease with stage 1 through stage 4 chronic kidney disease, or unspecified chronic kidney disease: Secondary | ICD-10-CM | POA: Insufficient documentation

## 2013-02-14 DIAGNOSIS — N189 Chronic kidney disease, unspecified: Secondary | ICD-10-CM | POA: Insufficient documentation

## 2013-02-14 DIAGNOSIS — I509 Heart failure, unspecified: Secondary | ICD-10-CM | POA: Insufficient documentation

## 2013-02-14 DIAGNOSIS — I639 Cerebral infarction, unspecified: Secondary | ICD-10-CM | POA: Diagnosis present

## 2013-02-14 DIAGNOSIS — F419 Anxiety disorder, unspecified: Secondary | ICD-10-CM | POA: Diagnosis present

## 2013-02-14 DIAGNOSIS — G8190 Hemiplegia, unspecified affecting unspecified side: Secondary | ICD-10-CM

## 2013-02-14 DIAGNOSIS — E119 Type 2 diabetes mellitus without complications: Secondary | ICD-10-CM | POA: Diagnosis present

## 2013-02-14 DIAGNOSIS — R079 Chest pain, unspecified: Secondary | ICD-10-CM

## 2013-02-14 DIAGNOSIS — I2510 Atherosclerotic heart disease of native coronary artery without angina pectoris: Secondary | ICD-10-CM | POA: Insufficient documentation

## 2013-02-14 HISTORY — DX: Personal history of other diseases of the digestive system: Z87.19

## 2013-02-14 HISTORY — DX: Migraine, unspecified, not intractable, without status migrainosus: G43.909

## 2013-02-14 HISTORY — DX: Presence of automatic (implantable) cardiac defibrillator: Z95.810

## 2013-02-14 HISTORY — DX: Iron deficiency anemia, unspecified: D50.9

## 2013-02-14 HISTORY — DX: Pneumonia, unspecified organism: J18.9

## 2013-02-14 HISTORY — DX: Chronic kidney disease, unspecified: N18.9

## 2013-02-14 HISTORY — DX: Presence of cardiac pacemaker: Z95.0

## 2013-02-14 HISTORY — DX: Other complications of anesthesia, initial encounter: T88.59XA

## 2013-02-14 HISTORY — DX: Shortness of breath: R06.02

## 2013-02-14 HISTORY — DX: Acute myocardial infarction, unspecified: I21.9

## 2013-02-14 HISTORY — DX: Type 2 diabetes mellitus without complications: E11.9

## 2013-02-14 HISTORY — DX: Gastro-esophageal reflux disease without esophagitis: K21.9

## 2013-02-14 HISTORY — DX: Personal history of peptic ulcer disease: Z87.11

## 2013-02-14 HISTORY — DX: Angina pectoris, unspecified: I20.9

## 2013-02-14 HISTORY — DX: Adverse effect of unspecified anesthetic, initial encounter: T41.45XA

## 2013-02-14 LAB — GLUCOSE, CAPILLARY
Glucose-Capillary: 121 mg/dL — ABNORMAL HIGH (ref 70–99)
Glucose-Capillary: 131 mg/dL — ABNORMAL HIGH (ref 70–99)

## 2013-02-14 LAB — COMPREHENSIVE METABOLIC PANEL
ALT: 31 U/L (ref 0–35)
AST: 34 U/L (ref 0–37)
Albumin: 4 g/dL (ref 3.5–5.2)
Alkaline Phosphatase: 68 U/L (ref 39–117)
Calcium: 9.7 mg/dL (ref 8.4–10.5)
GFR calc Af Amer: 35 mL/min — ABNORMAL LOW (ref >90)
Glucose, Bld: 128 mg/dL — ABNORMAL HIGH (ref 70–99)
Potassium: 4 mEq/L (ref 3.5–5.1)
Sodium: 144 mEq/L (ref 135–145)
Total Protein: 7.8 g/dL (ref 6.0–8.3)

## 2013-02-14 LAB — CBC
MCH: 29.6 pg (ref 26.0–34.0)
MCV: 83.6 fL (ref 78.0–100.0)
Platelets: 207 10*3/uL (ref 150–400)
RBC: 3.72 MIL/uL — ABNORMAL LOW (ref 3.87–5.11)
RDW: 12.9 % (ref 11.5–15.5)
WBC: 7.3 10*3/uL (ref 4.0–10.5)

## 2013-02-14 LAB — POCT I-STAT, CHEM 8
HCT: 33 % — ABNORMAL LOW (ref 36.0–46.0)
Hemoglobin: 11.2 g/dL — ABNORMAL LOW (ref 12.0–15.0)
Potassium: 4.2 mEq/L (ref 3.5–5.1)
Sodium: 143 mEq/L (ref 135–145)

## 2013-02-14 LAB — PROTIME-INR
INR: 0.96 (ref 0.00–1.49)
Prothrombin Time: 12.7 seconds (ref 11.6–15.2)

## 2013-02-14 LAB — URINALYSIS, ROUTINE W REFLEX MICROSCOPIC
Bilirubin Urine: NEGATIVE
Ketones, ur: NEGATIVE mg/dL
Leukocytes, UA: NEGATIVE
Nitrite: NEGATIVE
Protein, ur: NEGATIVE mg/dL
pH: 7 (ref 5.0–8.0)

## 2013-02-14 LAB — DIFFERENTIAL
Basophils Absolute: 0 10*3/uL (ref 0.0–0.1)
Basophils Relative: 0 % (ref 0–1)
Eosinophils Absolute: 0.1 10*3/uL (ref 0.0–0.7)
Eosinophils Relative: 1 % (ref 0–5)
Lymphs Abs: 2.6 10*3/uL (ref 0.7–4.0)
Neutrophils Relative %: 56 % (ref 43–77)

## 2013-02-14 LAB — RAPID URINE DRUG SCREEN, HOSP PERFORMED
Amphetamines: NOT DETECTED
Barbiturates: NOT DETECTED
Benzodiazepines: NOT DETECTED
Cocaine: NOT DETECTED
Tetrahydrocannabinol: NOT DETECTED

## 2013-02-14 LAB — ETHANOL: Alcohol, Ethyl (B): <11 mg/dL (ref 0–11)

## 2013-02-14 LAB — POCT I-STAT TROPONIN I

## 2013-02-14 MED ORDER — NITROGLYCERIN 0.4 MG SL SUBL
0.4000 mg | SUBLINGUAL_TABLET | SUBLINGUAL | Status: DC | PRN
Start: 2013-02-14 — End: 2013-02-14

## 2013-02-14 MED ORDER — ASPIRIN EC 325 MG PO TBEC
325.0000 mg | DELAYED_RELEASE_TABLET | Freq: Every day | ORAL | Status: DC
  Administered 2013-02-15: 325 mg via ORAL
  Filled 2013-02-14: qty 1

## 2013-02-14 MED ORDER — POLYVINYL ALCOHOL 1.4 % OP SOLN
1.0000 [drp] | OPHTHALMIC | Status: DC | PRN
  Filled 2013-02-14: qty 15

## 2013-02-14 MED ORDER — PANTOPRAZOLE SODIUM 40 MG PO TBEC
40.0000 mg | DELAYED_RELEASE_TABLET | Freq: Every day | ORAL | Status: DC
  Administered 2013-02-15: 40 mg via ORAL
  Filled 2013-02-14: qty 1

## 2013-02-14 MED ORDER — INSULIN GLARGINE 100 UNIT/ML ~~LOC~~ SOLN
10.0000 [IU] | Freq: Every day | SUBCUTANEOUS | Status: DC
  Administered 2013-02-14: 10 [IU] via SUBCUTANEOUS
  Filled 2013-02-14 (×3): qty 0.1

## 2013-02-14 MED ORDER — GLIPIZIDE 2.5 MG HALF TABLET
2.5000 mg | ORAL_TABLET | Freq: Every day | ORAL | Status: DC
  Administered 2013-02-15: 2.5 mg via ORAL
  Filled 2013-02-14 (×3): qty 1

## 2013-02-14 MED ORDER — VENLAFAXINE HCL 75 MG PO TABS
200.0000 mg | ORAL_TABLET | Freq: Every day | ORAL | Status: DC
  Administered 2013-02-15: 200 mg via ORAL
  Filled 2013-02-14 (×2): qty 1

## 2013-02-14 MED ORDER — FLUTICASONE PROPIONATE 50 MCG/ACT NA SUSP
2.0000 | NASAL | Status: DC | PRN
  Filled 2013-02-14: qty 16

## 2013-02-14 MED ORDER — ENOXAPARIN SODIUM 40 MG/0.4ML ~~LOC~~ SOLN
40.0000 mg | SUBCUTANEOUS | Status: DC
  Filled 2013-02-14 (×4): qty 0.4

## 2013-02-14 MED ORDER — ONDANSETRON HCL 4 MG PO TABS: 8.0000 mg | ORAL_TABLET | Freq: Three times a day (TID) | ORAL | Status: DC | PRN

## 2013-02-14 MED ORDER — SIMVASTATIN 10 MG PO TABS
10.0000 mg | ORAL_TABLET | Freq: Every day | ORAL | Status: DC
  Administered 2013-02-14: 10 mg via ORAL
  Filled 2013-02-14 (×2): qty 1

## 2013-02-14 MED ORDER — OXYBUTYNIN CHLORIDE ER 10 MG PO TB24
10.0000 mg | ORAL_TABLET | Freq: Every day | ORAL | Status: DC
Start: 2013-02-14 — End: 2013-02-15
  Administered 2013-02-14: 10 mg via ORAL
  Filled 2013-02-14 (×2): qty 1

## 2013-02-14 MED ORDER — ISOSORBIDE MONONITRATE ER 30 MG PO TB24
30.0000 mg | ORAL_TABLET | Freq: Every day | ORAL | Status: DC
  Administered 2013-02-15: 30 mg via ORAL
  Filled 2013-02-14: qty 1

## 2013-02-14 MED ORDER — TRAMADOL HCL 50 MG PO TABS: 50.0000 mg | ORAL_TABLET | Freq: Four times a day (QID) | ORAL | Status: DC | PRN

## 2013-02-14 MED ORDER — FUROSEMIDE 20 MG PO TABS
60.0000 mg | ORAL_TABLET | Freq: Two times a day (BID) | ORAL | Status: DC
  Administered 2013-02-14 – 2013-02-15 (×2): 60 mg via ORAL
  Filled 2013-02-14 (×4): qty 1

## 2013-02-14 MED ORDER — LORATADINE 10 MG PO TABS
10.0000 mg | ORAL_TABLET | Freq: Every day | ORAL | Status: DC
  Administered 2013-02-15: 10 mg via ORAL
  Filled 2013-02-14: qty 1

## 2013-02-14 MED ORDER — CLOPIDOGREL BISULFATE 75 MG PO TABS
75.0000 mg | ORAL_TABLET | Freq: Every day | ORAL | Status: DC
  Administered 2013-02-15: 75 mg via ORAL
  Filled 2013-02-14: qty 1

## 2013-02-14 MED ORDER — EZETIMIBE 10 MG PO TABS
10.0000 mg | ORAL_TABLET | Freq: Every day | ORAL | Status: DC
  Administered 2013-02-15: 10 mg via ORAL
  Filled 2013-02-14 (×2): qty 1

## 2013-02-14 MED ORDER — ENOXAPARIN SODIUM 40 MG/0.4ML ~~LOC~~ SOLN: 40.0000 mg | SUBCUTANEOUS | Status: DC

## 2013-02-14 MED ORDER — SODIUM CHLORIDE (HYPERTONIC) 5 % OP OINT
TOPICAL_OINTMENT | Freq: Every day | OPHTHALMIC | Status: DC | PRN
  Filled 2013-02-14: qty 3.5

## 2013-02-14 MED ORDER — ALPRAZOLAM 0.25 MG PO TABS
0.5000 mg | ORAL_TABLET | Freq: Three times a day (TID) | ORAL | Status: DC
  Administered 2013-02-14 (×2): 0.5 mg via ORAL
  Administered 2013-02-15: 1 mg via ORAL
  Filled 2013-02-14 (×2): qty 2
  Filled 2013-02-14: qty 4

## 2013-02-14 MED ORDER — METOPROLOL TARTRATE 100 MG PO TABS
100.0000 mg | ORAL_TABLET | Freq: Two times a day (BID) | ORAL | Status: DC
  Administered 2013-02-14 – 2013-02-15 (×2): 100 mg via ORAL
  Filled 2013-02-14 (×4): qty 1

## 2013-02-14 MED ORDER — INSULIN ASPART 100 UNIT/ML ~~LOC~~ SOLN
0.0000 [IU] | Freq: Three times a day (TID) | SUBCUTANEOUS | Status: DC
  Administered 2013-02-14: 2 [IU] via SUBCUTANEOUS

## 2013-02-14 MED ORDER — ARTIFICIAL TEAR OP GEL: 1.0000 [drp] | Freq: Four times a day (QID) | OPHTHALMIC | Status: DC | PRN

## 2013-02-14 MED ORDER — ADULT MULTIVITAMIN W/MINERALS CH
1.0000 | ORAL_TABLET | Freq: Every day | ORAL | Status: DC
  Administered 2013-02-14 – 2013-02-15 (×2): 1 via ORAL
  Filled 2013-02-14 (×2): qty 1

## 2013-02-14 MED ORDER — SODIUM CHLORIDE 0.9 % IJ SOLN
3.0000 mL | Freq: Two times a day (BID) | INTRAMUSCULAR | Status: DC
  Administered 2013-02-14 – 2013-02-15 (×3): 3 mL via INTRAVENOUS

## 2013-02-14 MED ORDER — NITROGLYCERIN 0.4 MG SL SUBL: 0.4000 mg | SUBLINGUAL_TABLET | SUBLINGUAL | Status: DC | PRN

## 2013-02-14 MED ORDER — POTASSIUM CHLORIDE CRYS ER 20 MEQ PO TBCR
20.0000 meq | EXTENDED_RELEASE_TABLET | Freq: Two times a day (BID) | ORAL | Status: DC
  Administered 2013-02-14 – 2013-02-15 (×3): 20 meq via ORAL
  Filled 2013-02-14 (×4): qty 1

## 2013-02-14 MED ORDER — VENLAFAXINE HCL 50 MG PO TABS
100.0000 mg | ORAL_TABLET | Freq: Every day | ORAL | Status: DC
  Administered 2013-02-14: 100 mg via ORAL
  Filled 2013-02-14 (×2): qty 2

## 2013-02-14 MED ORDER — ALBUTEROL SULFATE HFA 108 (90 BASE) MCG/ACT IN AERS: 2.0000 | INHALATION_SPRAY | Freq: Four times a day (QID) | RESPIRATORY_TRACT | Status: DC | PRN

## 2013-02-14 MED ORDER — VENLAFAXINE HCL 50 MG PO TABS: 100.0000 mg | ORAL_TABLET | Freq: Every morning | ORAL | Status: DC

## 2013-02-14 MED ORDER — STROKE: EARLY STAGES OF RECOVERY BOOK
Freq: Once | Status: AC
  Administered 2013-02-14: 16:00:00
  Filled 2013-02-14: qty 1

## 2013-02-14 NOTE — ED Notes (Signed)
CBG= 121

## 2013-02-14 NOTE — ED Notes (Signed)
IV attempted with no success, family refuses further attempts at this time

## 2013-02-14 NOTE — ED Notes (Signed)
2nd RN in room attempting IV start at this time

## 2013-02-14 NOTE — ED Notes (Signed)
Pt was in cardiac rehab, started to have CP at 805, continued to exercise.  States that at 905, she started to have tingling in her (L) hand and (L) leg.  Code stroke called at [phone].  Pt arrived in CT scan/ED at 0933.

## 2013-02-14 NOTE — Consult Note (Signed)
Referring Physician: Lita Mains    Chief Complaint: Left arm and leg decreased sensation.  HPI:                                                                                                                                         [name] is an 62 y.o. female who was seen 3 months ago for stroke. At that time her symptoms included left sided weakness and difficulty with her speech.  Although patient could not have a MRI it was felt by stroke team she did suffer a CVA.  At that time TEE was negative for thrombus or PFO and it was felt Pradaxa was not indicated and was placed on both Plavix and ASA. Today patient was in cardiac rehab when at 0830 she noted left arm paresthesia which then progressed to both left arm and leg over a 20 minute period of time.  Code stroke was called and patient was brought to CT.  CT head was negative for acute CVA. On examination her NIHSS was 1 and patient continued to feel left arm, leg and face decreased sensation. tPA was not administered due to minimal symptoms and recent CVA. Patietn will be continually watched.    Date last known well: 4.9.14 Time last known well: 8:30 AM tPA Given: No: minimal symptoms NIHSS 1 and previous stroke 3 months 4 days ago  Past Medical History  Diagnosis Date  . Ischemic cardiomyopathy     status post biventricular ICD placed by DR Edumunds who used to see Dr Melvern Banker here to establish  cardiovascular care.  . Diabetes mellitus   . Hypertension   . Coronary artery disease   . CHF (congestive heart failure)   . Stroke   . MVP (mitral valve prolapse)     Antibiotics not required for procedures  . Asthma   . Anxiety   . Depression   . Hiatal hernia   . Cervical dysplasia   . Fibroid   . Function kidney decreased   . History of shingles   . Cancer     lt breast ca  . Anemia     Past Surgical History  Procedure Laterality Date  . Insert / replace / remove pacemaker      biventricular defibrillator--06/10/  2009  . Abdominal hysterectomy      TAH   . Cardiac defibrillator placement    . Cardiac bypass surg      Triple  . Breast surgery      Breast Bx-benign_Left breast lump removed  . Cardiac catheterization    . Colposcopy    . Tee without cardioversion  11/14/2012    Procedure: TRANSESOPHAGEAL ECHOCARDIOGRAM (TEE);  Surgeon: Candee Furbish, MD;  Location: [facility];  Service: Cardiovascular;  Laterality: N/A;    Family History  Problem Relation Age of Onset  . Heart disease Mother   . Hypertension Mother   .  Heart disease Father   . Hypertension Father   . Diabetes Father   . Kidney failure Father   . Heart disease Brother   . Diabetes Brother   . Kidney failure Brother   . Diabetes Paternal Grandmother    Social History:  reports that she has never smoked. She does not have any smokeless tobacco history on file. She reports that  drinks alcohol. She reports that she does not use illicit drugs.  Allergies:  Allergies  Allergen Reactions  . Codeine     Unknown  . Digoxin And Related     Unknown  . Diltiazem     Unknown  . Latex     Latex tape  . Metolazone     Unknown  . Morphine Sulfate     Unknown  . Penicillins     Unknown  . Septra Ds (Sulfamethoxazole W/Trimethoprim (Co-Trimoxazole))     Unknown  . Spironolactone     Unknown  . Sulfa Drugs Cross Reactors     Unknown  . Taztia Xt (Diltiazem Hcl)     Unknown  . Tetracyclines & Related     Unknown  . Ticlid (Ticlopidine Hcl)     Unknown  . Verapamil     Unknown  . Vicodin (Hydrocodone-Acetaminophen)     Unknown    Medications:                                                                                                                           No current facility-administered medications for this encounter.   Current Outpatient Prescriptions  Medication Sig Dispense Refill  . albuterol (PROVENTIL HFA;VENTOLIN HFA) 108 (90 BASE) MCG/ACT inhaler Inhale 2 puffs into the lungs every 6 (six) hours as  needed. For shortness of breath      . ALPRAZolam (XANAX) 1 MG tablet Take 0.5-1 mg by mouth 3 (three) times daily. Take 1mg  in the morning and 0.5 mg in the afternoon and at night      . Artificial Tear GEL Apply to eye. 2 drops as needed       . aspirin 325 MG EC tablet Take 325 mg by mouth daily.        . clopidogrel (PLAVIX) 75 MG tablet Take 75 mg by mouth daily. 1 tab daily      . conjugated estrogens (PREMARIN) vaginal cream Place vaginally 3 (three) times a week. 0.5 grams in vagina 3 times a week  42.5 g  12  . ezetimibe (ZETIA) 10 MG tablet Take 10 mg by mouth daily.      . fluticasone (FLONASE) 50 MCG/ACT nasal spray Place 2 sprays into the nose as needed.       . furosemide (LASIX) 80 MG tablet Take 80 mg by mouth 2 (two) times daily.       [name] glipiZIDE (GLUCOTROL) 5 MG tablet Take 0.5 tablets (2.5 mg total) by mouth daily.  30 tablet  0  . insulin  glargine (LANTUS SOLOSTAR) 100 UNIT/ML injection Inject 10 Units into the skin at bedtime. As needed, after checking blood sugar      . isosorbide mononitrate (IMDUR) 30 MG 24 hr tablet Take 30 mg by mouth daily.      [name] loratadine (CLARITIN) 10 MG tablet Take 10 mg by mouth daily.      . metoprolol (LOPRESSOR) 100 MG tablet Take 100 mg by mouth 2 (two) times daily. 1 tab twice a day      . Multiple Vitamin (MULTIVITAMIN WITH MINERALS) TABS Take 1 tablet by mouth daily.      . nitroGLYCERIN (NITROSTAT) 0.4 MG SL tablet Place 0.4 mg under the tongue every 5 (five) minutes as needed. For chest pain      . omeprazole (PRILOSEC) 40 MG capsule Take 40 mg by mouth daily.      . ondansetron (ZOFRAN) 8 MG tablet Take 8 mg by mouth every 8 (eight) hours as needed. For nausea      . oxybutynin (DITROPAN-XL) 10 MG 24 hr tablet Take 10 mg by mouth at bedtime. 1 tab daily prn      . pantoprazole (PROTONIX) 40 MG tablet Take 40 mg by mouth daily.      . potassium chloride SA (K-DUR,KLOR-CON) 20 MEQ tablet Take 20 mEq by mouth 2 (two) times daily. 1 tab  twice a day      . ramipril (ALTACE) 10 MG capsule Take 10 mg by mouth 2 (two) times daily.       . simvastatin (ZOCOR) 20 MG tablet Take 10 mg by mouth at bedtime. 1 tab daily      . sodium chloride (MURO 128) 5 % ophthalmic ointment Place 1 drop into both eyes daily as needed. For dry eyes      . traMADol (ULTRAM) 50 MG tablet Take 50 mg by mouth every 6 (six) hours as needed. For pain      . venlafaxine (EFFEXOR) 100 MG tablet Take 300 mg by mouth every morning. 2 tabs am 1 tab in pm         ROS:                                                                                                                                       History obtained from the patient  General ROS: negative for - chills, fatigue, fever, night sweats, weight gain or weight loss Psychological ROS: negative for - behavioral disorder, hallucinations, memory difficulties, mood swings or suicidal ideation Ophthalmic ROS: negative for - blurry vision, double vision, eye pain or loss of vision ENT ROS: negative for - epistaxis, nasal discharge, oral lesions, sore throat, tinnitus or vertigo Allergy and Immunology ROS: negative for - hives or itchy/watery eyes Hematological and Lymphatic ROS: negative for - bleeding problems, bruising or swollen lymph nodes Endocrine ROS: negative for - galactorrhea, hair pattern changes, polydipsia/polyuria or temperature  intolerance Respiratory ROS: negative for - cough, hemoptysis, shortness of breath or wheezing Cardiovascular ROS: negative for - chest pain, dyspnea on exertion, edema or irregular heartbeat Gastrointestinal ROS: negative for - abdominal pain, diarrhea, hematemesis, nausea/vomiting or stool incontinence Genito-Urinary ROS: negative for - dysuria, hematuria, incontinence or urinary frequency/urgency Musculoskeletal ROS: negative for - joint swelling or muscular weakness Neurological ROS: as noted in HPI Dermatological ROS: negative for rash and skin lesion  changes  Neurologic Examination:                                                                                                      Blood pressure 137/77, pulse 69, temperature 98.2 F (36.8 C), resp. rate 18, SpO2 100.00%.  Mental Status: Alert, oriented, thought content appropriate.  Speech fluent without evidence of aphasia.  Able to follow 3 step commands without difficulty. Cranial Nerves: II: Discs flat bilaterally; Visual fields grossly normal, pupils equal, round, reactive to light and accommodation III,IV, VI: ptosis not present, extra-ocular motions intact bilaterally V,VII: smile symmetric, facial light touch sensation decreased over left lower face VIII: hearing normal bilaterally IX,X: gag reflex present XI: bilateral shoulder shrug XII: midline tongue extension Motor: Right : Upper extremity   5/5    Left:     Upper extremity   5/5  Lower extremity   5/5     Lower extremity   5/5 Tone and bulk:normal tone throughout; no atrophy noted Sensory: decreased over her left arm and leg.  Deep Tendon Reflexes: 2+ and symmetric throughout UE and KJ, no AJ Plantars: Right: downgoing   Left: downgoing Cerebellar: normal finger-to-nose,  normal heel-to-shin test CV: pulses palpable throughout    Results for orders placed during the hospital encounter of 02/14/13 (from the past 48 hour(s))  CBC     Status: Abnormal   Collection Time    02/14/13  9:34 AM      Result Value Range   WBC 7.3  4.0 - 10.5 K/uL   RBC 3.72 (*) 3.87 - 5.11 MIL/uL   Hemoglobin 11.0 (*) 12.0 - 15.0 g/dL   HCT 31.1 (*) 36.0 - 46.0 %   MCV 83.6  78.0 - 100.0 fL   MCH 29.6  26.0 - 34.0 pg   MCHC 35.4  30.0 - 36.0 g/dL   RDW 12.9  11.5 - 15.5 %   Platelets 207  150 - 400 K/uL  DIFFERENTIAL     Status: None   Collection Time    02/14/13  9:34 AM      Result Value Range   Neutrophils Relative 56  43 - 77 %   Neutro Abs 4.1  1.7 - 7.7 K/uL   Lymphocytes Relative 36  12 - 46 %   Lymphs Abs 2.6  0.7 -  4.0 K/uL   Monocytes Relative 7  3 - 12 %   Monocytes Absolute 0.5  0.1 - 1.0 K/uL   Eosinophils Relative 1  0 - 5 %   Eosinophils Absolute 0.1  0.0 - 0.7 K/uL   Basophils Relative 0  0 -  1 %   Basophils Absolute 0.0  0.0 - 0.1 K/uL  POCT I-STAT, CHEM 8     Status: Abnormal   Collection Time    02/14/13  9:49 AM      Result Value Range   Sodium 143  135 - 145 mEq/L   Potassium 4.2  3.5 - 5.1 mEq/L   Chloride 101  96 - 112 mEq/L   BUN 18  6 - 23 mg/dL   Creatinine, Ser 1.70 (*) 0.50 - 1.10 mg/dL   Glucose, Bld 130 (*) 70 - 99 mg/dL   Calcium, Ion 1.13  1.12 - 1.23 mmol/L   TCO2 32  0 - 100 mmol/L   Hemoglobin 11.2 (*) 12.0 - 15.0 g/dL   HCT 33.0 (*) 36.0 - 46.0 %   Ct Head Wo Contrast  02/14/2013  *RADIOLOGY REPORT*  Clinical Data: Left hand and foot weakness.  Possible stroke.  CT HEAD WITHOUT CONTRAST  Technique:  Contiguous axial images were obtained from the base of the skull through the vertex without contrast.  Comparison: 11/13/2012 and multiple previous  Findings: The brain does not show accelerated atrophy.  There are mild chronic appearing small vessel changes within the deep white matter.  No sign of acute infarction, mass lesion, hemorrhage, hydrocephalus or extra-axial collection.  The calvarium is unremarkable.  Sinuses, middle ears and mastoids are clear.  There is atherosclerotic calcification of the major vessels at the base of the brain.  Results called to the clinical service at  1000 hours.  IMPRESSION: No acute finding by CT.  Chronic small vessel changes of the hemispheric deep white matter.   Original Report Authenticated By: Nelson Chimes, M.D.     Assessment and plan discussed with with attending physician and they are in agreement.    Etta Quill PA-C [facility] [phone]  02/14/2013, 10:03 AM   Patient seen and examined.  Clinical course and management discussed.  Necessary edits performed.  I agree with the above.  Assessment and plan of care  developed and discussed below.    Assessment: 62 y.o. female presenting with left sided numbness,  NIHSS of 1.  Diagnosed with an acute infarct in January of this year.  Returned to baseline with only tongue numbness and left leg minor weakness.  Due to low NIHSS and recent infarct will not treat with tPA at this time.  Patient has had a recent stroke work up.  CT unremarkable.  MRI unable to be performed secondary to pacemaker.  Stroke Risk Factors - Paroxsymal atrial fibrillation, diabetes mellitus, hypertension and stroke  Plan: 1. OT consult 2. Prophylactic therapy- Continue ASA and Plavix 3. Risk factor modification 4. Telemetry monitoring 5. Frequent neuro checks  Case discussed with Dr. Exie Parody, MD [facility] [phone]  02/14/2013  12:31 PM

## 2013-02-14 NOTE — ED Notes (Signed)
RN on 3W to call back for report

## 2013-02-14 NOTE — Progress Notes (Signed)
[name] reported to the exercise specialist that she had chest pressure rated an 8 on a 1-10 scale upon walking into cardiac rehab  [name] did not alert staff.  At [place] reported having left arm tingling.  Blood pressure 100/80.  Patient placed on Zoll rhythm paced rate 70.  Patient then developed left foot tingling at 0915.  Dr Kingsley Plan office called and notified. [facility] called. Patient taken to the ED for evaluation of symptoms. [name] reported feeling chest pain when she transferred to the stretcher.  Patient taken to the CT via stretcher with [facility] RN. [place] team present.  Patients daughter is here with the patient.

## 2013-02-14 NOTE — H&P (Signed)
[facility] History and Physical  [name] [id number] DOB: 08-13-1951 DOA: 02/14/2013  Referring physician: Dr Lita Mains PCP: Reginia Naas, MD   Chief Complaint:  Chest pain x 1 day  left sided numbness x 1 day   HPI:  62 year old female wit history of ischemic cardiomyopathy, history of SVT status post ablation, AICD placement, CKD, diabetes mellitus, hypertension, history of multiple TIAs and recent workup for CVA who was at the cardiac rehabilitation today when she had a sharp substernal chest pain which was nonradiating and 6 /10 in intensity. On her way to rehabilitation she had two similar chest pain this morning. Completing third on the foot exercise he started having numbness in her left hand and foot with some weakness. She denies any headache, dizziness, bloody vision, shortness of breath, palpitations, abdominal pain, nausea, vomiting, fever, bowel or urinary symptoms. By the time she arrived to the ED she did not have any weakness of her extremity. She still had some numbness in her left hand and foot. She did not have any further chest pain. Code stroke was called  and neurology consulted however TPA not given minimal symptoms and a recent CVA. She was recently admitted for stroke like symptoms and was placed on both Plavix and aspirin. A TEE done at that time was negative and it was felt that prednisone was not suitable given her renal insufficiency. Also given the presence of AICD MRI could not be done.  Course in the ED Head CT done was unremarkable. Neurologic consult it from the ED. An EKG was done which was unremarkable. Initial cardiac enzyme was negative as well. [facility] called for admission.   Review of Systems:  Constitutional: Denies fever, chills, diaphoresis, appetite change and fatigue.  HEENT: Denies photophobia, eye pain, redness, hearing loss, ear pain, congestion, sore throat, rhinorrhea, sneezing, mouth sores, trouble  swallowing, neck pain, neck stiffness and tinnitus.   Respiratory: Denies SOB, DOE, cough, chest tightness,  and wheezing.   Cardiovascular: substernal chest pain, denies  palpitations and leg swelling.  Gastrointestinal: Denies nausea, vomiting, abdominal pain, diarrhea, constipation, blood in stool and abdominal distention.  Genitourinary: Denies dysuria, urgency, frequency, hematuria, flank pain and difficulty urinating.  Musculoskeletal: Denies myalgias, has chronic left sided back pain,  joint swelling, arthralgias and gait problem.  Skin: Denies pallor, rash and wound.  Neurological: left sided weakness and numbness, Denies dizziness, seizures, syncope,  light-headedness,  and headaches.  Hematological: Denies adenopathy. Easy bruising, personal or family bleeding history  Psychiatric/Behavioral: Denies suicidal ideation, mood changes, confusion, nervousness, sleep disturbance and agitation.   Past Medical History  Diagnosis Date  . Ischemic cardiomyopathy     status post biventricular ICD placed by DR Edumunds who used to see Dr Melvern Banker here to establish  cardiovascular care.  . Diabetes mellitus   . Hypertension   . Coronary artery disease   . CHF (congestive heart failure)   . Stroke   . MVP (mitral valve prolapse)     Antibiotics not required for procedures  . Asthma   . Anxiety   . Depression   . Hiatal hernia   . Cervical dysplasia   . Fibroid   . Function kidney decreased   . History of shingles   . Cancer     lt breast ca  . Anemia    Past Surgical History  Procedure Laterality Date  . Insert / replace / remove pacemaker      biventricular defibrillator--06/10/ 2009  . Abdominal hysterectomy  TAH   . Cardiac defibrillator placement    . Cardiac bypass surg      Triple  . Breast surgery      Breast Bx-benign_Left breast lump removed  . Cardiac catheterization    . Colposcopy    . Tee without cardioversion  11/14/2012    Procedure: TRANSESOPHAGEAL  ECHOCARDIOGRAM (TEE);  Surgeon: Candee Furbish, MD;  Location: [facility] ENDOSCOPY;  Service: Cardiovascular;  Laterality: N/A;   Social History:  reports that she has never smoked. She does not have any smokeless tobacco history on file. She reports that  drinks alcohol. She reports that she does not use illicit drugs.  Allergies  Allergen Reactions  . Codeine     Unknown  . Digoxin And Related     Unknown  . Diltiazem     Unknown  . Latex     Latex tape  . Metolazone     Unknown  . Morphine Sulfate     Unknown  . Penicillins     Unknown  . Septra Ds (Sulfamethoxazole W/Trimethoprim (Co-Trimoxazole))     Unknown  . Spironolactone     Unknown  . Sulfa Drugs Cross Reactors     Unknown  . Taztia Xt (Diltiazem Hcl)     Unknown  . Tetracyclines & Related     Unknown  . Ticlid (Ticlopidine Hcl)     Unknown  . Verapamil     Unknown  . Vicodin (Hydrocodone-Acetaminophen)     Unknown    Family History  Problem Relation Age of Onset  . Heart disease Mother   . Hypertension Mother   . Heart disease Father   . Hypertension Father   . Diabetes Father   . Kidney failure Father   . Heart disease Brother   . Diabetes Brother   . Kidney failure Brother   . Diabetes Paternal Grandmother     Prior to Admission medications   Medication Sig Start Date End Date Taking? Authorizing Provider  ALPRAZolam Duanne Moron) 1 MG tablet Take 0.5-1 mg by mouth 3 (three) times daily. Take 1mg  in the morning and 0.5 mg in the afternoon and at night   Yes Historical Provider, MD  Artificial Tear GEL Apply to eye. 2 drops as needed    Yes Historical Provider, MD  aspirin 325 MG EC tablet Take 325 mg by mouth daily.     Yes Historical Provider, MD  clopidogrel (PLAVIX) 75 MG tablet Take 75 mg by mouth daily. 1 tab daily 05/05/11  Yes Historical Provider, MD  conjugated estrogens (PREMARIN) vaginal cream Place vaginally 3 (three) times a week. 0.5 grams in vagina 3 times a week 03/07/12 03/07/13 Yes Bennetta Laos, MD  ezetimibe (ZETIA) 10 MG tablet Take 10 mg by mouth daily.   Yes Historical Provider, MD  fluticasone (FLONASE) 50 MCG/ACT nasal spray Place 2 sprays into the nose as needed.    Yes Historical Provider, MD  furosemide (LASIX) 80 MG tablet Take 80 mg by mouth 2 (two) times daily.  04/02/11  Yes Historical Provider, MD  glipiZIDE (GLUCOTROL) 5 MG tablet Take 0.5 tablets (2.5 mg total) by mouth daily. 11/14/12  Yes Annita Brod, MD  insulin glargine (LANTUS SOLOSTAR) 100 UNIT/ML injection Inject 10 Units into the skin at bedtime. As needed, after checking blood sugar   Yes Historical Provider, MD  isosorbide mononitrate (IMDUR) 30 MG 24 hr tablet Take 30 mg by mouth daily.   Yes Historical Provider, MD  loratadine (CLARITIN) 10  MG tablet Take 10 mg by mouth daily.   Yes Historical Provider, MD  metoprolol (LOPRESSOR) 100 MG tablet Take 100 mg by mouth 2 (two) times daily. 1 tab twice a day 03/11/11  Yes Historical Provider, MD  Multiple Vitamin (MULTIVITAMIN WITH MINERALS) TABS Take 1 tablet by mouth daily.   Yes Historical Provider, MD  nitroGLYCERIN (NITROSTAT) 0.4 MG SL tablet Place 0.4 mg under the tongue every 5 (five) minutes as needed. For chest pain   Yes Historical Provider, MD  omeprazole (PRILOSEC) 40 MG capsule Take 40 mg by mouth daily.   Yes Historical Provider, MD  oxybutynin (DITROPAN-XL) 10 MG 24 hr tablet Take 10 mg by mouth at bedtime.  05/25/11  Yes Historical Provider, MD  pantoprazole (PROTONIX) 40 MG tablet Take 40 mg by mouth daily.   Yes Historical Provider, MD  potassium chloride SA (K-DUR,KLOR-CON) 20 MEQ tablet Take 20 mEq by mouth 2 (two) times daily.  06/15/11  Yes Historical Provider, MD  ramipril (ALTACE) 10 MG capsule Take 10 mg by mouth 2 (two) times daily.    Yes Historical Provider, MD  simvastatin (ZOCOR) 20 MG tablet Take 10 mg by mouth at bedtime. 1 tab daily 04/16/11  Yes Historical Provider, MD  venlafaxine (EFFEXOR) 100 MG tablet Take 100-200 mg by  mouth every morning. 2 tabs am 1 tab in pm 04/12/11  Yes Historical Provider, MD  albuterol (PROVENTIL HFA;VENTOLIN HFA) 108 (90 BASE) MCG/ACT inhaler Inhale 2 puffs into the lungs every 6 (six) hours as needed. For shortness of breath    Historical Provider, MD  ondansetron (ZOFRAN) 8 MG tablet Take 8 mg by mouth every 8 (eight) hours as needed. For nausea    Historical Provider, MD  sodium chloride (MURO 128) 5 % ophthalmic ointment Place 1 drop into both eyes daily as needed. For dry eyes    Historical Provider, MD  traMADol (ULTRAM) 50 MG tablet Take 50 mg by mouth every 6 (six) hours as needed. For pain    Historical Provider, MD    Physical Exam:  Filed Vitals:   02/14/13 1300 02/14/13 1315 02/14/13 1330 02/14/13 1356  BP: 134/87 136/64 142/116   Pulse: 79 72 71   Temp:    98.4 F (36.9 C)  Resp: 14 15 18    SpO2: 99% 100% 100%     Constitutional: Vital signs reviewed.  Patient is a well-developed and well-nourished in no acute distress and cooperative with exam. Alert and oriented x3.  Head: Normocephalic and atraumatic Ear: TM normal bilaterally Mouth: no erythema or exudates, MMM Eyes: PERRL, EOMI, conjunctivae normal, No scleral icterus.  Neck: Supple, Trachea midline normal ROM, No JVD, mass, thyromegaly, or carotid bruit present.  Cardiovascular: midline sternotomy scar, RRR, S1 normal, S2 normal, no MRG, pulses symmetric and intact bilaterally Pulmonary/Chest: CTAB, no wheezes, rales, or rhonchi Abdominal: Soft. Non-tender, non-distended, bowel sounds are normal, no masses, organomegaly, or guarding present.  GU: no CVA tenderness Musculoskeletal: No joint deformities, erythema, or stiffness, ROM full and no nontender Ext: no edema and no cyanosis, pulses palpable bilaterally (DP and PT) Hematology: no cervical, inginal, or axillary adenopathy.  Neurological: A&O x3, Strenght is normal and symmetric bilaterally, cranial nerve II-XII are grossly intact, no focal motor  deficit, sensory intact to light touch bilaterally.  Skin: Warm, dry and intact. No rash, cyanosis, or clubbing.  Psychiatric: Normal mood and affect. speech and behavior is normal. Judgment and thought content normal. Cognition and memory are normal.   Labs  on Admission:  Basic Metabolic Panel:  Recent Labs Lab 02/14/13 0934 02/14/13 0949  NA 144 143  K 4.0 4.2  CL 100 101  CO2 32  --   GLUCOSE 128* 130*  BUN 17 18  CREATININE 1.77* 1.70*  CALCIUM 9.7  --    Liver Function Tests:  Recent Labs Lab 02/14/13 0934  AST 34  ALT 31  ALKPHOS 68  BILITOT 0.6  PROT 7.8  ALBUMIN 4.0   No results found for this basename: LIPASE, AMYLASE,  in the last 168 hours No results found for this basename: AMMONIA,  in the last 168 hours CBC:  Recent Labs Lab 02/14/13 0934 02/14/13 0949  WBC 7.3  --   NEUTROABS 4.1  --   HGB 11.0* 11.2*  HCT 31.1* 33.0*  MCV 83.6  --   PLT 207  --    Cardiac Enzymes:  Recent Labs Lab 02/14/13 0934  TROPONINI <0.30   BNP: No components found with this basename: POCBNP,  CBG:  Recent Labs Lab 02/14/13 1000  GLUCAP 121*    Radiological Exams on Admission: Ct Head Wo Contrast  02/14/2013  *RADIOLOGY REPORT*  Clinical Data: Left hand and foot weakness.  Possible stroke.  CT HEAD WITHOUT CONTRAST  Technique:  Contiguous axial images were obtained from the base of the skull through the vertex without contrast.  Comparison: 11/13/2012 and multiple previous  Findings: The brain does not show accelerated atrophy.  There are mild chronic appearing small vessel changes within the deep white matter.  No sign of acute infarction, mass lesion, hemorrhage, hydrocephalus or extra-axial collection.  The calvarium is unremarkable.  Sinuses, middle ears and mastoids are clear.  There is atherosclerotic calcification of the major vessels at the base of the brain.  Results called to the clinical service at  1000 hours.  IMPRESSION: No acute finding by CT.   Chronic small vessel changes of the hemispheric deep white matter.   Original Report Authenticated By: Nelson Chimes, M.D.    Dg Chest Port 1 View  02/14/2013  *RADIOLOGY REPORT*  Clinical Data: Stroke syndrome.  The previous CABG.  PORTABLE CHEST - 1 VIEW  Comparison: 11/12/2012  Findings: Extensive artifact overlies chest.  There has been previous median sternotomy and CABG.  Pacemaker/AICD remains in place with leads grossly unchanged.  The heart is mildly enlarged and the aorta is unfolded.  The vascularity is normal.  Lungs are clear.  No effusions.  No acute bony findings.  IMPRESSION: No active disease.  Previous CABG.  Pacemaker/AICD.   Original Report Authenticated By: Nelson Chimes, M.D.     EKG: NSR, no ST-T changes  Assessment/Plan Principal Problem:   TIA (transient ischemic attack) / ? Acute CVA Admit to telemetry Monitor neurochecks  . No further brain imaging as patient did not get an MRI due to AICD and given CKD will not perform CT with contrast. -Continue current dose of aspirin and Plavix -Continue statin -Resume home BP medications . She recently had a TEE to would not repeat a 2-D echo. -Appreciate neurology recommendations -Ordered PT/OT   Active Problems:  Chest pain Appears to be atypical. Initial cardiac enzyme and EKG unremarkable. Will cycle single cardiac enzyme. Continue aspirin. Sublingual nitrate when necessary for pain.    Chronic systolic heart failure Clinically euvolemic. EF of 40-45% on recent echo Continue  metoprolol . Hold ramipril given worsened creatinine. Reduce Lasix dose.     Coronary artery disease Continue aspirin, Imdur beta blocker and statin  Anxiety/ Depression Resume home medications    HTN (hypertension) Resume home medications    DM (diabetes mellitus) Resume home insulin and oral hypoglycemic. Continue sliding scale insulin Check A1c and lipid panel    (chronic kidney disease) Mildly elevated creatinine from previous  lab. We'll hold ramipril and recheck in  a.m. Will reduce Lasix to 60 mg twice a day   Code Status: full code Family Communication: discussed plan with family at bedside Disposition Plan: home once stable  Louellen Molder [facility] Pager [phone]  If 7PM-7AM, please contact night-coverage [email or web address] Password [facility] 02/14/2013, 2:34 PM    Total time spent in admission: 70 minutes

## 2013-02-14 NOTE — Code Documentation (Signed)
Called to Cardiac Rehab for patient with stroke symptoms. Code Stroke called at [facility] Stroke Team arrival at [phone]   See prior notes from staff, upon my arrival patient denied CP.  Patient lying on stretcher, head of bed lower. Initially patient with mild difficulty finding words.  Per daughter this his how she speaks when she is stressed or tired. Transported patient to radiology for head CT via stretcher with zoll defib/heart monitor.  ED staff met patient in CT, patient registered and assessed.  Labs drawn and head CT done. Transported to ED room D31.  Daughter at bedside during transport.  Speak resolved, fluent.  NIHSS 1, decreased sensory left side.  Will continue to monitor patient with frequent VS and neuro checks.

## 2013-02-14 NOTE — ED Notes (Signed)
Code Stroke cancelled.  

## 2013-02-14 NOTE — ED Provider Notes (Signed)
History     CSN: [id number]  Arrival date & time 02/14/13  [id number]   None     Chief Complaint  Patient presents with  . Code Stroke    (Consider location/radiation/quality/duration/timing/severity/associated sxs/prior treatment) HPI Pt was in cardiac rehab when she began having CP at [place] described as central grabbing pain. It resolved after a few min. At 0905 she c/o L hand and leg paresthesias. No focal weakness, vision or speech changes. Code stroke call ed prior to arrival.  Past Medical History  Diagnosis Date  . Ischemic cardiomyopathy     status post biventricular ICD placed by DR Edumunds who used to see Dr Melvern Banker here to establish  cardiovascular care.  . Diabetes mellitus   . Hypertension   . Coronary artery disease   . CHF (congestive heart failure)   . Stroke   . MVP (mitral valve prolapse)     Antibiotics not required for procedures  . Asthma   . Anxiety   . Depression   . Hiatal hernia   . Cervical dysplasia   . Fibroid   . Function kidney decreased   . History of shingles   . Cancer     lt breast ca  . Anemia     Past Surgical History  Procedure Laterality Date  . Insert / replace / remove pacemaker      biventricular defibrillator--06/10/ 2009  . Abdominal hysterectomy      TAH   . Cardiac defibrillator placement    . Cardiac bypass surg      Triple  . Breast surgery      Breast Bx-benign_Left breast lump removed  . Cardiac catheterization    . Colposcopy    . Tee without cardioversion  11/14/2012    Procedure: TRANSESOPHAGEAL ECHOCARDIOGRAM (TEE);  Surgeon: Candee Furbish, MD;  Location: [facility] ENDOSCOPY;  Service: Cardiovascular;  Laterality: N/A;    Family History  Problem Relation Age of Onset  . Heart disease Mother   . Hypertension Mother   . Heart disease Father   . Hypertension Father   . Diabetes Father   . Kidney failure Father   . Heart disease Brother   . Diabetes Brother   . Kidney failure Brother   . Diabetes Paternal Grandmother      History  Substance Use Topics  . Smoking status: Never Smoker   . Smokeless tobacco: Not on file  . Alcohol Use: Yes     Comment: rare    OB History   Grav Para Term Preterm Abortions TAB SAB Ect Mult Living   7 2  2 5     1       Review of Systems  Constitutional: Negative for fever and chills.  Respiratory: Negative for cough and shortness of breath.   Cardiovascular: Positive for chest pain. Negative for palpitations and leg swelling.  Gastrointestinal: Negative for nausea, vomiting and abdominal pain.  Musculoskeletal: Negative for back pain.  Skin: Negative for rash and wound.  Neurological: Positive for numbness. Negative for dizziness, weakness, light-headedness and headaches.  All other systems reviewed and are negative.    Allergies  Codeine; Digoxin and related; Diltiazem; Latex; Metolazone; Morphine sulfate; Penicillins; Septra ds; Spironolactone; Sulfa drugs cross reactors; Taztia xt; Tetracyclines & related; Ticlid; Verapamil; and Vicodin  Home Medications   Current Outpatient Rx  Name  Route  Sig  Dispense  Refill  . albuterol (PROVENTIL HFA;VENTOLIN HFA) 108 (90 BASE) MCG/ACT inhaler   Inhalation   Inhale 2 puffs  into the lungs every 6 (six) hours as needed. For shortness of breath         . ALPRAZolam (XANAX) 1 MG tablet   Oral   Take 0.5-1 mg by mouth 3 (three) times daily. Take 1mg  in the morning and 0.5 mg in the afternoon and at night         . Artificial Tear GEL   Ophthalmic   Apply to eye. 2 drops as needed          . aspirin 325 MG EC tablet   Oral   Take 325 mg by mouth daily.           . clopidogrel (PLAVIX) 75 MG tablet   Oral   Take 75 mg by mouth daily. 1 tab daily         . conjugated estrogens (PREMARIN) vaginal cream   Vaginal   Place vaginally 3 (three) times a week. 0.5 grams in vagina 3 times a week   42.5 g   12     This is correct rx with correct directions.   [name] ezetimibe (ZETIA) 10 MG tablet   Oral    Take 10 mg by mouth daily.         . fluticasone (FLONASE) 50 MCG/ACT nasal spray   Nasal   Place 2 sprays into the nose as needed.          . furosemide (LASIX) 80 MG tablet   Oral   Take 80 mg by mouth 2 (two) times daily.          [name] glipiZIDE (GLUCOTROL) 5 MG tablet   Oral   Take 0.5 tablets (2.5 mg total) by mouth daily.   30 tablet   0   . insulin glargine (LANTUS SOLOSTAR) 100 UNIT/ML injection   Subcutaneous   Inject 10 Units into the skin at bedtime. As needed, after checking blood sugar         . isosorbide mononitrate (IMDUR) 30 MG 24 hr tablet   Oral   Take 30 mg by mouth daily.         [name] loratadine (CLARITIN) 10 MG tablet   Oral   Take 10 mg by mouth daily.         . metoprolol (LOPRESSOR) 100 MG tablet   Oral   Take 100 mg by mouth 2 (two) times daily. 1 tab twice a day         . Multiple Vitamin (MULTIVITAMIN WITH MINERALS) TABS   Oral   Take 1 tablet by mouth daily.         . nitroGLYCERIN (NITROSTAT) 0.4 MG SL tablet   Sublingual   Place 0.4 mg under the tongue every 5 (five) minutes as needed. For chest pain         . omeprazole (PRILOSEC) 40 MG capsule   Oral   Take 40 mg by mouth daily.         . ondansetron (ZOFRAN) 8 MG tablet   Oral   Take 8 mg by mouth every 8 (eight) hours as needed. For nausea         . oxybutynin (DITROPAN-XL) 10 MG 24 hr tablet   Oral   Take 10 mg by mouth at bedtime. 1 tab daily prn         . pantoprazole (PROTONIX) 40 MG tablet   Oral   Take 40 mg by mouth daily.         . potassium  chloride SA (K-DUR,KLOR-CON) 20 MEQ tablet   Oral   Take 20 mEq by mouth 2 (two) times daily. 1 tab twice a day         . ramipril (ALTACE) 10 MG capsule   Oral   Take 10 mg by mouth 2 (two) times daily.          . simvastatin (ZOCOR) 20 MG tablet   Oral   Take 10 mg by mouth at bedtime. 1 tab daily         . sodium chloride (MURO 128) 5 % ophthalmic ointment   Both Eyes   Place 1 drop into  both eyes daily as needed. For dry eyes         . traMADol (ULTRAM) 50 MG tablet   Oral   Take 50 mg by mouth every 6 (six) hours as needed. For pain         . venlafaxine (EFFEXOR) 100 MG tablet   Oral   Take 300 mg by mouth every morning. 2 tabs am 1 tab in pm           BP 137/77  Pulse 69  Temp(Src) 98.2 F (36.8 C)  Resp 18  SpO2 100%  Physical Exam  Nursing note and vitals reviewed. Constitutional: She is oriented to person, place, and time. She appears well-developed and well-nourished. No distress.  HENT:  Head: Normocephalic and atraumatic.  Mouth/Throat: Oropharynx is clear and moist.  Eyes: EOM are normal. Pupils are equal, round, and reactive to light.  Neck: Normal range of motion. Neck supple.  Cardiovascular: Normal rate and regular rhythm.   Pulmonary/Chest: Effort normal and breath sounds normal. No respiratory distress. She has no wheezes. She has no rales. She exhibits no tenderness.  Abdominal: Soft. Bowel sounds are normal. She exhibits no distension and no mass. There is no tenderness. There is no rebound and no guarding.  Musculoskeletal: Normal range of motion. She exhibits no edema and no tenderness.  No calf swelling or pain.   Neurological: She is alert and oriented to person, place, and time.  5/5 motor in all ext, Paresthesias in L hand and plantar surface of L foot.   Skin: Skin is warm and dry. No rash noted. No erythema.  Psychiatric: She has a normal mood and affect. Her behavior is normal.    ED Course  Procedures (including critical care time)  Labs Reviewed  CBC - Abnormal; Notable for the following:    RBC 3.72 (*)    Hemoglobin 11.0 (*)    HCT 31.1 (*)    All other components within normal limits  POCT I-STAT, CHEM 8 - Abnormal; Notable for the following:    Creatinine, Ser 1.70 (*)    Glucose, Bld 130 (*)    Hemoglobin 11.2 (*)    HCT 33.0 (*)    All other components within normal limits  DIFFERENTIAL  ETHANOL   PROTIME-INR  APTT  COMPREHENSIVE METABOLIC PANEL  TROPONIN I  URINE RAPID DRUG SCREEN (HOSP PERFORMED)  URINALYSIS, ROUTINE W REFLEX MICROSCOPIC   Ct Head Wo Contrast  02/14/2013  *RADIOLOGY REPORT*  Clinical Data: Left hand and foot weakness.  Possible stroke.  CT HEAD WITHOUT CONTRAST  Technique:  Contiguous axial images were obtained from the base of the skull through the vertex without contrast.  Comparison: 11/13/2012 and multiple previous  Findings: The brain does not show accelerated atrophy.  There are mild chronic appearing small vessel changes within the deep white matter.  No sign of acute infarction, mass lesion, hemorrhage, hydrocephalus or extra-axial collection.  The calvarium is unremarkable.  Sinuses, middle ears and mastoids are clear.  There is atherosclerotic calcification of the major vessels at the base of the brain.  Results called to the clinical service at  1000 hours.  IMPRESSION: No acute finding by CT.  Chronic small vessel changes of the hemispheric deep white matter.   Original Report Authenticated By: Nelson Chimes, M.D.      No diagnosis found.   Date: 02/14/2013  Rate: 68  Rhythm: normal sinus rhythm  QRS Axis: normal  Intervals: normal  ST/T Wave abnormalities: normal  Conduction Disutrbances:none  Narrative Interpretation:   Old EKG Reviewed: unchanged    MDM  Per neurology, not tPA candidate. Discussed with [facility] who will admit.   Pt stable in ED. Resting comfortably         Julianne Rice, MD 02/14/13 1228

## 2013-02-15 DIAGNOSIS — I2510 Atherosclerotic heart disease of native coronary artery without angina pectoris: Secondary | ICD-10-CM

## 2013-02-15 DIAGNOSIS — G8190 Hemiplegia, unspecified affecting unspecified side: Secondary | ICD-10-CM

## 2013-02-15 DIAGNOSIS — G459 Transient cerebral ischemic attack, unspecified: Principal | ICD-10-CM

## 2013-02-15 LAB — LIPID PANEL
Cholesterol: 178 mg/dL (ref 0–200)
Total CHOL/HDL Ratio: 3.3 RATIO

## 2013-02-15 LAB — HEMOGLOBIN A1C
Hgb A1c MFr Bld: 6.8 % — ABNORMAL HIGH (ref <5.7)
Mean Plasma Glucose: 148 mg/dL — ABNORMAL HIGH (ref <117)

## 2013-02-15 LAB — GLUCOSE, CAPILLARY
Glucose-Capillary: 123 mg/dL — ABNORMAL HIGH (ref 70–99)
Glucose-Capillary: 160 mg/dL — ABNORMAL HIGH (ref 70–99)

## 2013-02-15 LAB — BASIC METABOLIC PANEL
CO2: 32 mEq/L (ref 19–32)
GFR calc non Af Amer: 31 mL/min — ABNORMAL LOW (ref >90)
Glucose, Bld: 145 mg/dL — ABNORMAL HIGH (ref 70–99)
Potassium: 3.7 mEq/L (ref 3.5–5.1)
Sodium: 142 mEq/L (ref 135–145)

## 2013-02-15 NOTE — Progress Notes (Signed)
Utilization review completed. Denece Shearer, RN, BSN. 

## 2013-02-15 NOTE — Progress Notes (Signed)
Occupational Therapy Evaluation Patient Details Name: [name] MRN: [id number] DOB: 18-Dec-1950 Today's Date: 02/15/2013 Time: [id number] OT Time Calculation (min): 35 min  OT Assessment / Plan / Recommendation Clinical Impression  62yo female adm with chest pain followed by numbness/weakness L hand/foot while at cardiac rehab. CT head = no acute findings. Patient with recent TIA in January of this year. Patient presents to OT with all symptoms of TIA resolved and is I with ADLs at this time. No further OT indicated and OT will sign off.    OT Assessment  Patient does not need any further OT services    Follow Up Recommendations  No OT follow up       Equipment Recommendations  None recommended by OT          Precautions / Restrictions Precautions Precautions: None Restrictions Weight Bearing Restrictions: No        ADL  Eating/Feeding: Independent;Simulated Where Assessed - Eating/Feeding: Edge of bed Grooming: Performed;Wash/dry hands;Brushing hair;Independent Where Assessed - Grooming: Unsupported standing Upper Body Bathing: Simulated;Independent Where Assessed - Upper Body Bathing: Unsupported sitting Lower Body Bathing: Simulated;Independent Where Assessed - Lower Body Bathing: Unsupported sit to stand Upper Body Dressing: Performed;Independent Where Assessed - Upper Body Dressing: Unsupported sitting Lower Body Dressing: Performed;Independent Where Assessed - Lower Body Dressing: Unsupported sitting Toilet Transfer: Performed;Independent Toilet Transfer Method: Sit to Loss adjuster, chartered: Regular height toilet Toileting - Clothing Manipulation and Hygiene: Performed;Independent Where Assessed - Toileting Clothing Manipulation and Hygiene: Standing ADL Comments: Patient with no LOB, no difficulties with ADL tasks at this time.          Visit Information  Last OT Received On: 02/15/13 Assistance Needed: +1    Subjective Data  Subjective: Patient reports her symptoms have completely resolved Patient Stated Goal: To go home   Prior [place] Lives With: Spouse Available Help at Discharge: Family;Available PRN/intermittently Type of Home: House Bathroom Shower/Tub: Chiropodist: Standard Prior Function Level of Independence: Independent Driving: Yes Vocation: Retired Comments: attending cardiac rehab T/Th/F Communication Communication: Other (comment) (slow, deliberate speech (residual from prior TIA)) Dominant Hand: Right         Vision/Perception Vision - History Baseline Vision: No visual deficits Perception Perception: Within Functional Limits Praxis Praxis: Not tested   Cognition  Cognition Overall Cognitive Status: Appears within functional limits for tasks assessed/performed Arousal/Alertness: Awake/alert Orientation Level: Oriented X4 / Intact Behavior During Session: [facility] for tasks performed    Extremity/Trunk Assessment Right Upper Extremity Assessment RUE ROM/Strength/Tone: WFL for tasks assessed RUE Sensation: WFL - Light Touch RUE Coordination: WFL - gross/fine motor Left Upper Extremity Assessment LUE ROM/Strength/Tone: WFL for tasks assessed LUE Sensation: WFL - Light Touch LUE Coordination: WFL - gross/fine motor     Mobility Bed Mobility Bed Mobility: Supine to Sit;Sit to Supine Supine to Sit: 7: Independent Sit to Supine: 7: Independent Transfers Transfers: Sit to Stand;Stand to Sit Sit to Stand: 7: Independent Stand to Sit: 7: Independent           End of Session OT - End of Session Activity Tolerance: Patient tolerated treatment well Patient left: in bed;with call bell/phone within reach  GO     [name] A 02/15/2013, 10:49 AM

## 2013-02-15 NOTE — Progress Notes (Signed)
Pt discharged to home per MD order. Pt received and reviewed all discharge instructions and medication information including follow-up appointments and prescriptions, including stroke discharge education.  Pt verbalized understanding. Pt alert and oriented at discharge with no complaints of pain. Pt escorted to private vehicle via wheelchair by guest services. Shanda Bumps

## 2013-02-15 NOTE — Discharge Summary (Signed)
Physician Discharge Summary  [name] [id number] DOB: [birth year] DOA: 02/14/2013  PCP: Reginia Naas, MD  Admit date: 02/14/2013 Discharge date: 02/15/2013  Time spent: 40 minutes  Recommendations for Outpatient Follow-up:  Home with out pt PCP and neurology follow up  Discharge Diagnoses:  Principal Problem:   TIA (transient ischemic attack) vs mild stroke  Active Problems:   Chronic systolic heart failure   Paroxysmal SVT (supraventricular tachycardia)   Coronary artery disease   MVP (mitral valve prolapse)   Anxiety   Depression   CVA (cerebral infarction)   HTN (hypertension)   DM (diabetes mellitus)   CKD (chronic kidney disease)   Discharge Condition: fair  Diet recommendation: diabetic  Filed Weights   02/14/13 1530 02/15/13 0400  Weight: 72.167 kg (159 lb 1.6 oz) 72.485 kg (159 lb 12.8 oz)    History of present illness:  62 year old female wit history of ischemic cardiomyopathy, history of SVT status post ablation, AICD placement, CKD, diabetes mellitus, hypertension, history of multiple TIAs and recent workup for CVA who was at the cardiac rehabilitation today when she had a sharp substernal chest pain which was nonradiating and 6 /10 in intensity. On her way to rehabilitation she had two similar chest pain this morning. Completing third round of  Exercise she started having numbness in her left hand and foot with some weakness.  She denies any headache, dizziness, bloody vision, shortness of breath, palpitations, abdominal pain, nausea, vomiting, fever, bowel or urinary symptoms. By the time she arrived to the ED she did not have any weakness of her extremity. She still had some numbness in her left hand and foot. She did not have any further chest pain. Code stroke was called and neurology consulted however TPA not given minimal symptoms and a recent CVA.  She was recently admitted for stroke like symptoms and was placed on both Plavix and  aspirin. A TEE done at that time was negative and it was felt pradaxa was not suitable given her renal insufficiency. Also given the presence of AICD MRI could not be done.   Course in the ED  Head CT done was unremarkable. Neurologic consult it from the ED. An EKG was done which was unremarkable. Initial cardiac enzyme was negative as well. [facility] hospitalist called for admission.   Hospital Course:   TIA (transient ischemic attack) with concern for Acute CVA  Admitted to telemetry . Weakness had resolved by the time i saw her in the ED. She had minimal left hand and leg numbness which is now resolved. . No further brain imaging done as patient did not get an MRI due to AICD and given CKD will not perform CT with contrast.  -Continue current dose of aspirin and Plavix  -Continue statin  -Resumed home BP medications . She recently had a TEE to would not repeat a 2-D echo.  -Appreciate neurology recommendations . No further tests recommended and to continue ASA and plavix and outpt follow up for secondary stroke prevention.  Active Problems:  Chest pain  Appears to be atypical. serial  cardiac enzyme and EKG unremarkable.resolved.  Chronic systolic heart failure  Clinically euvolemic. EF of 40-45% on recent echo  Continue metoprolol . Held ramipril and lasix dose reduced. Renal function better today. Resume home meds   Coronary artery disease  Continue aspirin, Imdur beta blocker and statin .   Anxiety/ Depression  Resume home medications   HTN (hypertension)  Resume home medications   DM (diabetes mellitus)  Resume home insulin and oral hypoglycemic. Continue sliding scale insulin  Check A1c and lipid panel shows mildly elevated TAG. A1C pending.  (chronic kidney disease)  Mildly elevated creatinine from previous lab. Held  ramipril and reduced Lasix to 60 mg twice a day . Better this am. Resume home dose on d/c   Patient clinically stable for d/c home with outpt follow up  with her PCP and neurology. ( has appt with Dr Leonie Man from prior discharge). instructed on secondary stroke prevention, diet and medication compliance.       Procedures:  NONE  Consultations:  NEUROLOGY  Discharge Exam: Filed Vitals:   02/15/13 0400 02/15/13 0853 02/15/13 1011 02/15/13 1038  BP: 117/76 122/77 136/79 128/73  Pulse: 65 70 81 67  Temp: 97.8 F (36.6 C) 97.9 F (36.6 C) 98 F (36.7 C)   TempSrc: Oral Oral Oral   Resp: 18 18 18    Height:      Weight: 72.485 kg (159 lb 12.8 oz)     SpO2: 96% 100% 100%     General: middle aged female in NAD HEENT: no pallor, moist oral mucosa Chest : clear b/l, no added sounds CVS: NS1&S2, no murmurs Abd: soft, NT, ND, BS+ Ext: warm, no edema  CNS: AAOX3 , Non focal   Discharge Instructions   Future Appointments Provider Department Dept Phone   02/16/2013 8:15 AM [facility] [place] [phone]   02/20/2013 8:15 AM [facility] [place] [phone]   02/21/2013 8:15 AM [facility] [place] [phone]   02/23/2013 8:15 AM [facility] [place] [phone]   02/27/2013 8:15 AM [facility] [place] [phone]   02/28/2013 8:15 AM [facility] [place] [phone]   03/02/2013 8:15 AM [facility] [place] [phone]   03/06/2013 8:15 AM [facility] [place] [phone]   03/07/2013 8:15 AM [facility] [place] [phone]   03/09/2013 8:15 AM [facility] [place] [phone]   03/13/2013 8:15 AM [facility]  [facility] [place] [phone]   03/14/2013 8:15 AM [facility] [place] [phone]   03/16/2013 8:15 AM [facility] [place] [phone]   03/20/2013 8:15 AM [facility] [place] [phone]   03/21/2013 8:15 AM [facility] [place] [phone]   03/23/2013 8:15 AM [facility] [place] [phone]   03/27/2013 8:15 AM [facility] [place] [phone]   03/28/2013 8:15 AM [facility] [place] [phone]   03/30/2013 8:15 AM [facility] [place] [phone]   04/03/2013 8:15 AM [facility] [place] [phone]   04/04/2013 8:15 AM [facility] [place] [phone]   04/06/2013 8:15 AM [facility] [place] [phone]   05/21/2013 2:00 PM Dennie Bible, NP [facility] [phone]       Medication List    TAKE these medications       albuterol 108 (90  BASE) MCG/ACT inhaler  Commonly known as:  PROVENTIL HFA;VENTOLIN HFA  Inhale 2 puffs into the lungs every 6 (six) hours as needed. For shortness of breath     ALPRAZolam 1 MG tablet  Commonly known as:  XANAX  Take 0.5-1 mg by mouth 3 (three) times daily. Take 1mg  in the morning and 0.5 mg in the afternoon and at night     Artificial Tear Gel  Apply to eye. 2 drops as needed     aspirin 325 MG EC tablet  Take 325 mg by mouth daily.     clopidogrel 75 MG tablet  Commonly known as:  PLAVIX  Take 75 mg by mouth daily. 1 tab daily      conjugated estrogens vaginal cream  Commonly known as:  PREMARIN  Place vaginally 3 (three) times a week. 0.5 grams in vagina 3 times a week     ezetimibe 10 MG tablet  Commonly known as:  ZETIA  Take 10 mg by mouth daily.     fluticasone 50 MCG/ACT nasal spray  Commonly known as:  FLONASE  Place 2 sprays into the nose as needed.     furosemide 80 MG tablet  Commonly known as:  LASIX  Take 80 mg by mouth 2 (two) times daily.     glipiZIDE 5 MG tablet  Commonly known as:  GLUCOTROL  Take 0.5 tablets (2.5 mg total) by mouth daily.     isosorbide mononitrate 30 MG 24 hr tablet  Commonly known as:  IMDUR  Take 30 mg by mouth daily.     LANTUS SOLOSTAR 100 UNIT/ML injection  Generic drug:  insulin glargine  Inject 10 Units into the skin at bedtime. As needed, after checking blood sugar     loratadine 10 MG tablet  Commonly known as:  CLARITIN  Take 10 mg by mouth daily.     metoprolol 100 MG tablet  Commonly known as:  LOPRESSOR  Take 100 mg by mouth 2 (two) times daily. 1 tab twice a day     multivitamin with minerals Tabs  Take 1 tablet by mouth daily.     nitroGLYCERIN 0.4 MG SL tablet  Commonly known as:  NITROSTAT  Place 0.4 mg under the tongue every 5 (five) minutes as needed. For chest pain     omeprazole 40 MG capsule  Commonly known as:  PRILOSEC  Take 40 mg by mouth daily.     ondansetron 8 MG tablet  Commonly known as:  ZOFRAN  Take 8 mg by mouth every 8 (eight) hours as needed. For nausea     oxybutynin 10 MG 24 hr tablet  Commonly known as:  DITROPAN-XL  Take 10 mg by mouth at bedtime.     pantoprazole 40 MG tablet  Commonly known as:  PROTONIX  Take 40 mg by mouth daily.     potassium chloride SA 20 MEQ tablet  Commonly known as:  K-DUR,KLOR-CON  Take 20 mEq by mouth 2 (two) times daily.     ramipril 10 MG capsule  Commonly known as:  ALTACE  Take 10 mg by mouth 2 (two) times daily.     simvastatin 20 MG tablet  Commonly known as:  ZOCOR   Take 10 mg by mouth at bedtime. 1 tab daily     sodium chloride 5 % ophthalmic ointment  Commonly known as:  MURO 128  Place 1 drop into both eyes daily as needed. For dry eyes     traMADol  50 MG tablet  Commonly known as:  ULTRAM  Take 50 mg by mouth every 6 (six) hours as needed. For pain     venlafaxine 100 MG tablet  Commonly known as:  EFFEXOR  Take 100-200 mg by mouth every morning. 2 tabs am 1 tab in pm           Follow-up Information   Follow up with Reginia Naas, MD In 1 week.   Contact information:   [place] [address] [phone]       Follow up with Forbes Cellar, MD. (has follow up appointment from prior discharge)    Contact information:   [address] [place] [address] [phone]        The results of significant diagnostics from this hospitalization (including imaging, microbiology, ancillary and laboratory) are listed below for reference.    Significant Diagnostic Studies: Ct Head Wo Contrast  02/14/2013  *RADIOLOGY REPORT*  Clinical Data: Left hand and foot weakness.  Possible stroke.  CT HEAD WITHOUT CONTRAST  Technique:  Contiguous axial images were obtained from the base of the skull through the vertex without contrast.  Comparison: 11/13/2012 and multiple previous  Findings: The brain does not show accelerated atrophy.  There are mild chronic appearing small vessel changes within the deep white matter.  No sign of acute infarction, mass lesion, hemorrhage, hydrocephalus or extra-axial collection.  The calvarium is unremarkable.  Sinuses, middle ears and mastoids are clear.  There is atherosclerotic calcification of the major vessels at the base of the brain.  Results called to the clinical service at  1000 hours.  IMPRESSION: No acute finding by CT.  Chronic small vessel changes of the hemispheric deep white matter.   Original Report Authenticated By: Nelson Chimes, M.D.    Dg Chest Port 1  View  02/14/2013  *RADIOLOGY REPORT*  Clinical Data: Stroke syndrome.  The previous CABG.  PORTABLE CHEST - 1 VIEW  Comparison: 11/12/2012  Findings: Extensive artifact overlies chest.  There has been previous median sternotomy and CABG.  Pacemaker/AICD remains in place with leads grossly unchanged.  The heart is mildly enlarged and the aorta is unfolded.  The vascularity is normal.  Lungs are clear.  No effusions.  No acute bony findings.  IMPRESSION: No active disease.  Previous CABG.  Pacemaker/AICD.   Original Report Authenticated By: Nelson Chimes, M.D.     Microbiology: No results found for this or any previous visit (from the past 240 hour(s)).   Labs: Basic Metabolic Panel:  Recent Labs Lab 02/14/13 0934 02/14/13 0949 02/15/13 0505  NA 144 143 142  K 4.0 4.2 3.7  CL 100 101 101  CO2 32  --  32  GLUCOSE 128* 130* 145*  BUN 17 18 17   CREATININE 1.77* 1.70* 1.72*  CALCIUM 9.7  --  9.4   Liver Function Tests:  Recent Labs Lab 02/14/13 0934  AST 34  ALT 31  ALKPHOS 68  BILITOT 0.6  PROT 7.8  ALBUMIN 4.0   No results found for this basename: LIPASE, AMYLASE,  in the last 168 hours No results found for this basename: AMMONIA,  in the last 168 hours CBC:  Recent Labs Lab 02/14/13 0934 02/14/13 0949  WBC 7.3  --   NEUTROABS 4.1  --   HGB 11.0* 11.2*  HCT 31.1* 33.0*  MCV 83.6  --   PLT 207  --    Cardiac Enzymes:  Recent Labs Lab 02/14/13 0934 02/14/13  1716 02/14/13 2214 02/15/13 0505  TROPONINI <0.30 <0.30 <0.30 <0.30   BNP: BNP (last 3 results) No results found for this basename: PROBNP,  in the last 8760 hours CBG:  Recent Labs Lab 02/14/13 1000 02/14/13 1617 02/14/13 2147 02/15/13 0724  GLUCAP 121* 131* 86 123*       Signed:  Yunuen Mordan  [facility] 02/15/2013, 10:39 AM

## 2013-02-15 NOTE — Progress Notes (Signed)
Stroke Team Progress Note  HISTORY [name] is an 62 y.o. female who was seen 3 months ago for stroke. At that time her symptoms included left sided weakness and difficulty with her speech. Although patient could not have a MRI it was felt by stroke team she did suffer a CVA. At that time TEE was negative for thrombus or PFO and it was felt Pradaxa was not indicated and was placed on both Plavix and ASA. Today patient was in cardiac rehab when at 0830 she noted left arm paresthesia which then progressed to both left arm and leg over a 20 minute period of time. Code stroke was called and patient was brought to CT. CT head was negative for acute CVA. On examination her NIHSS was 1 and patient continued to feel left arm, leg and face decreased sensation. tPA was not administered due to minimal symptoms and recent CVA. Patietn will be continually watched. Patient was not a TPA candidate secondary to mild symptoms, NIHSS 1. She was admitted for further evaluation and treatment.  SUBJECTIVE No family is at the bedside.  Overall she feels her condition is completely resolved.   OBJECTIVE Most recent Vital Signs: Filed Vitals:   02/15/13 0000 02/15/13 0200 02/15/13 0400 02/15/13 0853  BP: 125/73 117/70 117/76 122/77  Pulse: 69 67 65 70  Temp: 97.5 F (36.4 C) 97.7 F (36.5 C) 97.8 F (36.6 C) 97.9 F (36.6 C)  TempSrc: Oral Oral Oral Oral  Resp: 18 16 18 18   Height:      Weight:   72.485 kg (159 lb 12.8 oz)   SpO2: 95% 100% 96% 100%   CBG (last 3)   Recent Labs  02/14/13 1617 02/14/13 2147 02/15/13 0724  GLUCAP 131* 86 123*   IV Fluid Intake:     MEDICATIONS  . ALPRAZolam  0.5-1 mg Oral TID  . aspirin  325 mg Oral Daily  . clopidogrel  75 mg Oral Daily  . enoxaparin (LOVENOX) injection  40 mg Subcutaneous Q24H  . ezetimibe  10 mg Oral Daily  . furosemide  60 mg Oral BID  . glipiZIDE  2.5 mg Oral QAC breakfast  . insulin aspart  0-15 Units Subcutaneous TID WC  . insulin  glargine  10 Units Subcutaneous QHS  . isosorbide mononitrate  30 mg Oral Daily  . loratadine  10 mg Oral Daily  . metoprolol  100 mg Oral BID  . multivitamin with minerals  1 tablet Oral Daily  . oxybutynin  10 mg Oral QHS  . pantoprazole  40 mg Oral Daily  . potassium chloride SA  20 mEq Oral BID  . simvastatin  10 mg Oral QHS  . sodium chloride  3 mL Intravenous Q12H  . venlafaxine  200 mg Oral QAC breakfast   And  . venlafaxine  100 mg Oral Q supper   PRN:  albuterol, fluticasone, nitroGLYCERIN, ondansetron, polyvinyl alcohol, sodium chloride, traMADol  Diet:  Carb Control thin liquids Activity:  Bedrest wtih Bathroom privileges, as tolerated DVT Prophylaxis:  Lovenox 40 mg sq daily   CLINICALLY SIGNIFICANT STUDIES Basic Metabolic Panel:   Recent Labs Lab 02/14/13 0934 02/14/13 0949 02/15/13 0505  NA 144 143 142  K 4.0 4.2 3.7  CL 100 101 101  CO2 32  --  32  GLUCOSE 128* 130* 145*  BUN 17 18 17   CREATININE 1.77* 1.70* 1.72*  CALCIUM 9.7  --  9.4   Liver Function Tests:   Recent Labs Lab  02/14/13 0934  AST 34  ALT 31  ALKPHOS 68  BILITOT 0.6  PROT 7.8  ALBUMIN 4.0   CBC:   Recent Labs Lab 02/14/13 0934 02/14/13 0949  WBC 7.3  --   NEUTROABS 4.1  --   HGB 11.0* 11.2*  HCT 31.1* 33.0*  MCV 83.6  --   PLT 207  --    Coagulation:   Recent Labs Lab 02/14/13 0934  LABPROT 12.7  INR 0.96   Cardiac Enzymes:   Recent Labs Lab 02/14/13 1716 02/14/13 2214 02/15/13 0505  TROPONINI <0.30 <0.30 <0.30   Urinalysis:   Recent Labs Lab 02/14/13 2014  COLORURINE YELLOW  LABSPEC 1.007  PHURINE 7.0  GLUCOSEU NEGATIVE  HGBUR NEGATIVE  BILIRUBINUR NEGATIVE  KETONESUR NEGATIVE  PROTEINUR NEGATIVE  UROBILINOGEN 0.2  NITRITE NEGATIVE  LEUKOCYTESUR NEGATIVE   Lipid Panel    Component Value Date/Time   CHOL 178 02/15/2013 0505   TRIG 152* 02/15/2013 0505   HDL 54 02/15/2013 0505   CHOLHDL 3.3 02/15/2013 0505   VLDL 30 02/15/2013 0505   LDLCALC  94 02/15/2013 0505   HgbA1C  Lab Results  Component Value Date   HGBA1C 6.6* 11/12/2012    Urine Drug Screen:     Component Value Date/Time   LABOPIA NONE DETECTED 02/14/2013 2015   COCAINSCRNUR NONE DETECTED 02/14/2013 2015   LABBENZ NONE DETECTED 02/14/2013 2015   AMPHETMU NONE DETECTED 02/14/2013 2015   THCU NONE DETECTED 02/14/2013 2015   LABBARB NONE DETECTED 02/14/2013 2015    Alcohol Level:   Recent Labs Lab 02/14/13 [place] <11   CT of the brain  02/14/2013  No acute finding by CT.  Chronic small vessel changes of the hemispheric deep white matter.   TEE 11/14/2012 no source of embolus, no PFO  CXR  02/14/2013   No active disease.  Previous CABG.  Pacemaker/AICD.   EKG  normal sinus rhythm.   Therapy Recommendations no therapy needs  Physical Exam   Pleasant middle aged african american lady not in distress.Awake alert. Afebrile. Head is nontraumatic. Neck is supple without bruit. Hearing is normal. Cardiac exam no murmur or gallop. Lungs are clear to auscultation. Distal pulses are well felt. Neurological Exam :    Awake  Alert oriented x 3. Normal speech and language.eye movements full without nystagmus.fundi were not visualized. Vision acuity and fields appear normal. Hearing is normal. Palatal movements are normal. Face symmetric. Tongue midline. Normal strength, tone, reflexes and coordination. Normal sensation. Gait deferred. ASSESSMENT [name] is a 62 y.o. female presenting with left sided numbness that lasted > 12h while in cardiac rehab, preceded by chest pain. CT stable. Unable to do MRI due to pacer. Dx: no new stroke, as stroke sx similar to last admission, this is likely transient worsening of old stroke deficits in the setting of stress vs nonorganic etiology.  On aspirin 325 mg orally every day and clopidogrel 75 mg orally every day prior to admission. Now on aspirin 325 mg orally every day and clopidogrel 75 mg orally every day for secondary stroke  prevention. Patient with no resultant neuro deficits. No further stroke workup indicated.  Last admission there was question of atrial fibrillation, this was refuted by cardiology as they had no documentation of atrial fibrillation; pacer was also interrogated which showed no atrial fibrillation. Therefore, pt not an anticoagulation candidate Hypertension Hyperlipidemia, LDL 94, on statin PTA, on statin now, goal LDL < 100 Diabetes, HgbA1c 6.6 Ischemic cardiomyopathy  s/p biventricular defibrillator/pacer CAD, CABG Hx stroke MVP  Hospital day # 1  TREATMENT/PLAN  Continue clopidogrel 75 mg orally every day and aspirin for secondary stroke prevention. No further stroke workup indicated. Ongoing risk factor control by Primary Care Physician Stroke Service will sign off. Please call should any needs arise. Instruct pt to continue follow up with Dr. Leonie Man as previously scheduled from last admission.   Chancellor for discharge from stroke standpoint  Burnetta Sabin, MSN, RN, ANVP-BC, ANP-BC, [facility] Pager: [phone] 02/15/2013 8:55 AM  I have personally obtained a history, examined the patient, evaluated imaging results, and formulated the assessment and plan of care. I agree with the above.  Antony Contras, MD

## 2013-02-16 ENCOUNTER — Encounter (HOSPITAL_COMMUNITY): Payer: Self-pay

## 2013-02-20 ENCOUNTER — Encounter (HOSPITAL_COMMUNITY): Payer: Self-pay

## 2013-02-21 ENCOUNTER — Encounter (HOSPITAL_COMMUNITY): Payer: Self-pay

## 2013-02-21 LAB — REMOTE ICD DEVICE
AL AMPLITUDE: 2.6 mv
ATRIAL PACING ICD: 0.6 pct
BRDY-0002LV: 50 {beats}/min
BRDY-0003LV: 130 {beats}/min
FVT: 0
TZAT-0001ATACH: 2
TZAT-0001FASTVT: 1
TZAT-0002ATACH: NEGATIVE
TZAT-0002ATACH: NEGATIVE
TZAT-0002FASTVT: NEGATIVE
TZAT-0004SLOWVT: 8
TZAT-0005SLOWVT: 88 pct
TZAT-0005SLOWVT: 91 pct
TZAT-0011SLOWVT: 10 ms
TZAT-0011SLOWVT: 10 ms
TZAT-0012ATACH: 150 ms
TZAT-0018ATACH: NEGATIVE
TZAT-0018FASTVT: NEGATIVE
TZAT-0019ATACH: 6 V
TZAT-0019ATACH: 6 V
TZAT-0019ATACH: 6 V
TZAT-0020ATACH: 1.5 ms
TZAT-0020ATACH: 1.5 ms
TZST-0001FASTVT: 5
TZST-0001SLOWVT: 4
TZST-0002FASTVT: NEGATIVE
TZST-0002FASTVT: NEGATIVE
TZST-0002FASTVT: NEGATIVE
TZST-0003SLOWVT: 35 J
TZST-0003SLOWVT: 35 J
VENTRICULAR PACING ICD: 99.98 pct
VF: 0

## 2013-02-23 ENCOUNTER — Encounter (HOSPITAL_COMMUNITY): Payer: Self-pay

## 2013-02-27 ENCOUNTER — Encounter (HOSPITAL_COMMUNITY): Payer: Self-pay

## 2013-02-28 ENCOUNTER — Encounter (HOSPITAL_COMMUNITY): Payer: Self-pay

## 2013-03-02 ENCOUNTER — Encounter (HOSPITAL_COMMUNITY): Payer: Self-pay

## 2013-03-06 ENCOUNTER — Encounter (HOSPITAL_COMMUNITY): Payer: Self-pay

## 2013-03-06 ENCOUNTER — Encounter: Payer: Self-pay | Admitting: *Deleted

## 2013-03-07 ENCOUNTER — Encounter (HOSPITAL_COMMUNITY)
Admission: RE | Admit: 2013-03-07 | Discharge: 2013-03-07 | Disposition: A | Discharge status: Home / Self Care | Payer: Self-pay | Source: Ambulatory Visit | Attending: Cardiology | Admitting: Cardiology

## 2013-03-08 ENCOUNTER — Encounter: Payer: Self-pay | Admitting: Internal Medicine

## 2013-03-09 ENCOUNTER — Encounter (HOSPITAL_COMMUNITY)
Admission: RE | Admit: 2013-03-09 | Discharge: 2013-03-09 | Disposition: A | Discharge status: Home / Self Care | Payer: Self-pay | Source: Ambulatory Visit | Attending: Cardiology | Admitting: Cardiology

## 2013-03-09 DIAGNOSIS — Z95810 Presence of automatic (implantable) cardiac defibrillator: Secondary | ICD-10-CM | POA: Insufficient documentation

## 2013-03-09 DIAGNOSIS — E119 Type 2 diabetes mellitus without complications: Secondary | ICD-10-CM | POA: Insufficient documentation

## 2013-03-09 DIAGNOSIS — Z5189 Encounter for other specified aftercare: Secondary | ICD-10-CM | POA: Insufficient documentation

## 2013-03-09 DIAGNOSIS — I509 Heart failure, unspecified: Secondary | ICD-10-CM | POA: Insufficient documentation

## 2013-03-09 DIAGNOSIS — I428 Other cardiomyopathies: Secondary | ICD-10-CM | POA: Insufficient documentation

## 2013-03-09 DIAGNOSIS — I2510 Atherosclerotic heart disease of native coronary artery without angina pectoris: Secondary | ICD-10-CM | POA: Insufficient documentation

## 2013-03-09 DIAGNOSIS — I10 Essential (primary) hypertension: Secondary | ICD-10-CM | POA: Insufficient documentation

## 2013-03-13 ENCOUNTER — Encounter (HOSPITAL_COMMUNITY): Payer: Self-pay

## 2013-03-14 ENCOUNTER — Encounter (HOSPITAL_COMMUNITY)
Admission: RE | Admit: 2013-03-14 | Discharge: 2013-03-14 | Disposition: A | Discharge status: Home / Self Care | Payer: Self-pay | Source: Ambulatory Visit | Attending: Cardiology | Admitting: Cardiology

## 2013-03-16 ENCOUNTER — Encounter (HOSPITAL_COMMUNITY): Payer: Self-pay

## 2013-03-20 ENCOUNTER — Encounter (HOSPITAL_COMMUNITY)
Admission: RE | Admit: 2013-03-20 | Discharge: 2013-03-20 | Disposition: A | Discharge status: Home / Self Care | Payer: Self-pay | Source: Ambulatory Visit | Attending: Cardiology | Admitting: Cardiology

## 2013-03-21 ENCOUNTER — Encounter (HOSPITAL_COMMUNITY): Payer: Self-pay

## 2013-03-23 ENCOUNTER — Encounter (HOSPITAL_COMMUNITY): Payer: Self-pay

## 2013-03-27 ENCOUNTER — Encounter (HOSPITAL_COMMUNITY)
Admission: RE | Admit: 2013-03-27 | Discharge: 2013-03-27 | Disposition: A | Discharge status: Home / Self Care | Payer: Self-pay | Source: Ambulatory Visit | Attending: Cardiology | Admitting: Cardiology

## 2013-03-28 ENCOUNTER — Encounter (HOSPITAL_COMMUNITY): Payer: Self-pay

## 2013-03-30 ENCOUNTER — Encounter (HOSPITAL_COMMUNITY)
Admission: RE | Admit: 2013-03-30 | Discharge: 2013-03-30 | Disposition: A | Discharge status: Home / Self Care | Payer: Self-pay | Source: Ambulatory Visit | Attending: Cardiology | Admitting: Cardiology

## 2013-04-03 ENCOUNTER — Encounter (HOSPITAL_COMMUNITY): Payer: Self-pay

## 2013-04-04 ENCOUNTER — Encounter (HOSPITAL_COMMUNITY)
Admission: RE | Admit: 2013-04-04 | Discharge: 2013-04-04 | Disposition: A | Discharge status: Home / Self Care | Payer: Self-pay | Source: Ambulatory Visit | Attending: Cardiology | Admitting: Cardiology

## 2013-04-06 ENCOUNTER — Encounter (HOSPITAL_COMMUNITY)
Admission: RE | Admit: 2013-04-06 | Discharge: 2013-04-06 | Disposition: A | Discharge status: Home / Self Care | Payer: Self-pay | Source: Ambulatory Visit | Attending: Cardiology | Admitting: Cardiology

## 2013-04-11 ENCOUNTER — Telehealth: Payer: Self-pay | Admitting: *Deleted

## 2013-04-12 ENCOUNTER — Other Ambulatory Visit: Payer: Self-pay | Admitting: Neurology

## 2013-04-12 ENCOUNTER — Telehealth: Payer: Self-pay | Admitting: *Deleted

## 2013-04-12 DIAGNOSIS — G43909 Migraine, unspecified, not intractable, without status migrainosus: Secondary | ICD-10-CM

## 2013-04-12 MED ORDER — TOPIRAMATE 100 MG PO TABS: 50.0000 mg | ORAL_TABLET | Freq: Two times a day (BID) | ORAL | Status: DC

## 2013-04-12 NOTE — Telephone Encounter (Signed)
Called patient.. he is having increased frequency of migraines. Recommend increase tramadol to 100 mg q. 8 hourly when necessary. Start Topamax 50 mg daily at night for one week increase to twice daily. Call back next week.

## 2013-04-12 NOTE — Telephone Encounter (Signed)
Patient called stating she has had a series of headaches on Tuesday and Wednesday which were very severe and her bp shot up 182/98. Patient is concerned since she has had a TIA in the past. Callback [phone]

## 2013-04-17 ENCOUNTER — Encounter (HOSPITAL_COMMUNITY)
Admission: RE | Admit: 2013-04-17 | Discharge: 2013-04-17 | Disposition: A | Discharge status: Home / Self Care | Payer: Self-pay | Source: Ambulatory Visit | Attending: Cardiology | Admitting: Cardiology

## 2013-04-17 DIAGNOSIS — Z5189 Encounter for other specified aftercare: Secondary | ICD-10-CM | POA: Insufficient documentation

## 2013-04-17 DIAGNOSIS — I2510 Atherosclerotic heart disease of native coronary artery without angina pectoris: Secondary | ICD-10-CM | POA: Insufficient documentation

## 2013-04-17 DIAGNOSIS — I428 Other cardiomyopathies: Secondary | ICD-10-CM | POA: Insufficient documentation

## 2013-04-17 DIAGNOSIS — E119 Type 2 diabetes mellitus without complications: Secondary | ICD-10-CM | POA: Insufficient documentation

## 2013-04-17 DIAGNOSIS — I10 Essential (primary) hypertension: Secondary | ICD-10-CM | POA: Insufficient documentation

## 2013-04-17 DIAGNOSIS — Z95810 Presence of automatic (implantable) cardiac defibrillator: Secondary | ICD-10-CM | POA: Insufficient documentation

## 2013-04-17 DIAGNOSIS — I509 Heart failure, unspecified: Secondary | ICD-10-CM | POA: Insufficient documentation

## 2013-05-04 ENCOUNTER — Encounter (HOSPITAL_COMMUNITY)
Admission: RE | Admit: 2013-05-04 | Discharge: 2013-05-04 | Disposition: A | Discharge status: Home / Self Care | Payer: Self-pay | Source: Ambulatory Visit | Attending: Cardiology | Admitting: Cardiology

## 2013-05-09 ENCOUNTER — Encounter (HOSPITAL_COMMUNITY)
Admission: RE | Admit: 2013-05-09 | Discharge: 2013-05-09 | Disposition: A | Discharge status: Home / Self Care | Payer: Self-pay | Source: Ambulatory Visit | Attending: Cardiology | Admitting: Cardiology

## 2013-05-09 DIAGNOSIS — I509 Heart failure, unspecified: Secondary | ICD-10-CM | POA: Insufficient documentation

## 2013-05-09 DIAGNOSIS — I428 Other cardiomyopathies: Secondary | ICD-10-CM | POA: Insufficient documentation

## 2013-05-09 DIAGNOSIS — I10 Essential (primary) hypertension: Secondary | ICD-10-CM | POA: Insufficient documentation

## 2013-05-09 DIAGNOSIS — Z95810 Presence of automatic (implantable) cardiac defibrillator: Secondary | ICD-10-CM | POA: Insufficient documentation

## 2013-05-09 DIAGNOSIS — E119 Type 2 diabetes mellitus without complications: Secondary | ICD-10-CM | POA: Insufficient documentation

## 2013-05-09 DIAGNOSIS — I2510 Atherosclerotic heart disease of native coronary artery without angina pectoris: Secondary | ICD-10-CM | POA: Insufficient documentation

## 2013-05-09 DIAGNOSIS — Z5189 Encounter for other specified aftercare: Secondary | ICD-10-CM | POA: Insufficient documentation

## 2013-05-09 NOTE — Progress Notes (Signed)
Orissa reported feeling lightheaded on the nustep this morning at cardiac rehab.  Blood pressure 110/60. Madisan had not eaten breakfast this morning. Patient was given graham crackers and water. [name]'s symptoms resolved after eating she ate her snack.  Zuriah was able to complete exercise walking the track wiout further complaints. Will continue to monitor the patient throughout  the program.

## 2013-05-18 ENCOUNTER — Telehealth: Payer: Self-pay | Admitting: Neurology

## 2013-05-21 ENCOUNTER — Ambulatory Visit: Payer: Self-pay | Admitting: Nurse Practitioner

## 2013-05-21 ENCOUNTER — Ambulatory Visit (INDEPENDENT_AMBULATORY_CARE_PROVIDER_SITE_OTHER): Payer: 59 | Admitting: *Deleted

## 2013-05-21 DIAGNOSIS — Z95810 Presence of automatic (implantable) cardiac defibrillator: Secondary | ICD-10-CM

## 2013-05-21 DIAGNOSIS — I5022 Chronic systolic (congestive) heart failure: Secondary | ICD-10-CM

## 2013-05-28 LAB — REMOTE ICD DEVICE
AL AMPLITUDE: 2.7 mv
BAMS-0001: 150 {beats}/min
BRDY-0002LV: 50 {beats}/min
BRDY-0003LV: 130 {beats}/min
FVT: 0
LV LEAD THRESHOLD: 1.5 V
RV LEAD IMPEDENCE ICD: 464 Ohm
TOT-0006: 20090610000000
TZAT-0001ATACH: 1
TZAT-0001ATACH: 2
TZAT-0001SLOWVT: 1
TZAT-0002ATACH: NEGATIVE
TZAT-0002ATACH: NEGATIVE
TZAT-0002FASTVT: NEGATIVE
TZAT-0004SLOWVT: 8
TZAT-0004SLOWVT: 8
TZAT-0005SLOWVT: 88 pct
TZAT-0005SLOWVT: 91 pct
TZAT-0011SLOWVT: 10 ms
TZAT-0011SLOWVT: 10 ms
TZAT-0012ATACH: 150 ms
TZAT-0012ATACH: 150 ms
TZAT-0012SLOWVT: 200 ms
TZAT-0012SLOWVT: 200 ms
TZAT-0013SLOWVT: 2
TZAT-0013SLOWVT: 2
TZAT-0018ATACH: NEGATIVE
TZAT-0018ATACH: NEGATIVE
TZAT-0018SLOWVT: NEGATIVE
TZAT-0018SLOWVT: NEGATIVE
TZAT-0019ATACH: 6 V
TZAT-0019ATACH: 6 V
TZAT-0019FASTVT: 8 V
TZAT-0020ATACH: 1.5 ms
TZON-0003ATACH: 400 ms
TZON-0003SLOWVT: 350 ms
TZON-0004SLOWVT: 32
TZON-0004VSLOWVT: 36
TZON-0005SLOWVT: 12
TZST-0001ATACH: 4
TZST-0001ATACH: 5
TZST-0001FASTVT: 2
TZST-0001FASTVT: 3
TZST-0001FASTVT: 6
TZST-0001SLOWVT: 3
TZST-0001SLOWVT: 4
TZST-0001SLOWVT: 6
TZST-0002ATACH: NEGATIVE
TZST-0002FASTVT: NEGATIVE
TZST-0002FASTVT: NEGATIVE
TZST-0002FASTVT: NEGATIVE
TZST-0003SLOWVT: 35 J
VF: 0

## 2013-06-05 ENCOUNTER — Telehealth (HOSPITAL_COMMUNITY): Payer: Self-pay | Admitting: *Deleted

## 2013-06-08 ENCOUNTER — Encounter: Payer: Self-pay | Admitting: Nurse Practitioner

## 2013-06-08 ENCOUNTER — Ambulatory Visit (INDEPENDENT_AMBULATORY_CARE_PROVIDER_SITE_OTHER): Payer: 59 | Admitting: Nurse Practitioner

## 2013-06-08 VITALS — BP 116/71 | HR 70 | Ht 64.5 in | Wt 155.0 lb

## 2013-06-08 DIAGNOSIS — I6350 Cerebral infarction due to unspecified occlusion or stenosis of unspecified cerebral artery: Secondary | ICD-10-CM

## 2013-06-08 DIAGNOSIS — I639 Cerebral infarction, unspecified: Secondary | ICD-10-CM

## 2013-06-08 DIAGNOSIS — G459 Transient cerebral ischemic attack, unspecified: Secondary | ICD-10-CM

## 2013-06-08 DIAGNOSIS — I5022 Chronic systolic (congestive) heart failure: Secondary | ICD-10-CM

## 2013-06-08 DIAGNOSIS — I10 Essential (primary) hypertension: Secondary | ICD-10-CM

## 2013-06-08 NOTE — Patient Instructions (Signed)
Continue aspirin 325 mg orally every day and clopidogrel 75 mg orally every day  for secondary stroke prevention and maintain strict control of hypertension with blood pressure goal below 130/90, diabetes with hemoglobin A1c goal below 6.5% and lipids with LDL cholesterol goal below 100 mg/dL.   STROKE/TIA INSTRUCTIONS SMOKING Cigarette smoking nearly doubles your risk of having a stroke & is the single most alterable risk factor  If you smoke or have smoked in the last 12 months, you are advised to quit smoking for your health.  Most of the excess cardiovascular risk related to smoking disappears within a year of stopping.  Ask you doctor about anti-smoking medications  [place] Quit Line: 1-800-QUIT NOW  Free Smoking Cessation Classes [phone]  CHOLESTEROL Know your levels; limit fat & cholesterol in your diet  Lab Results  Component Value Date   CHOL 178 02/15/2013   HDL 54 02/15/2013   LDLCALC 94 02/15/2013   TRIG 152* 02/15/2013   CHOLHDL 3.3 02/15/2013      Many patients benefit from treatment even if their cholesterol is at goal.  Goal: Total Cholesterol less than 160  Goal:  LDL less than 100  Goal:  HDL greater than 40  Goal:  Triglycerides less than 150  BLOOD PRESSURE American Stroke Association blood pressure target is less that 120/80 mm/Hg  Your discharge blood pressure is:  BP: 116/71 mmHg  Monitor your blood pressure  Limit your salt and alcohol intake  Many individuals will require more than one medication for high blood pressure  DIABETES (A1c is a blood sugar average for last 3 months) Goal A1c is under 7% (A1c is blood sugar average for last 3 months)  Diabetes: Diagnosis of diabetes:  A1c was not drawn this admission    Lab Results  Component Value Date   HGBA1C 6.8* 02/15/2013    Your A1c can be lowered with medications, healthy diet, and exercise.  Check your blood sugar as directed by your physician  Call your physician if you experience unexplained  or low blood sugars.  PHYSICAL ACTIVITY/REHABILITATION Goal is 30 minutes at least 4 days per week    Activity decreases your risk of heart attack and stroke and makes your heart stronger.  It helps control your weight and blood pressure; helps you relax and can improve your mood.  Participate in a regular exercise program.  Talk with your doctor about the best form of exercise for you (dancing, walking, swimming, cycling).  DIET/WEIGHT Goal is to maintain a healthy weight  Your height is:  Height: 5' 4.5" (163.8 cm) Your current weight is: Weight: 155 lb (70.308 kg) Your body Mass Index (BMI) is:  BMI (Calculated): 26.2  Following the type of diet specifically designed for you will help prevent another stroke.  Your goal Body Mass Index (BMI) is 19-24.  Healthy food habits can help reduce 3 risk factors for stroke:  High cholesterol, hypertension, and excess weight.

## 2013-06-08 NOTE — Progress Notes (Signed)
[facility]  PATIENT: [name] DOB: Jul 27, 1951   HISTORY FROM: patient, chart REASON FOR VISIT: stroke follow up  HISTORY OF PRESENT ILLNESS:  [name] is an 62 y.o. female who on 11/11/12 awoke with left sided weakness and difficulty with her speech.  Although patient could not have a MRI due to AICD it was felt by stroke team she did suffer a CVA. At that time TEE was negative for thrombus or PFO and it was felt Pradaxa was not indicated and was placed on both Plavix and ASA. Today patient was in cardiac rehab when at 0830 she noted left arm paresthesia which then progressed to both left arm and leg over a 20 minute period of time. Code stroke was called and patient was brought to CT. CT head was negative for acute CVA. On examination her NIHSS was 1 and patient continued to feel left arm, leg and face decreased sensation.  UPDATE 06/08/13 (LL): Patient comes to office for stroke follow up.  She reports that she is doing very well and has had no new TIA symptoms (left arm and leg weakness) since the last episode while at cardiac rehab on 02/15/13.  She has mild word hesitancy when she speaks.  She takes Plavix and ASA 325 mg daily.  Patient denies medication side effects, with no signs of bleeding or bruising. No new complaints.  REVIEW OF SYSTEMS: Full 14 system review of systems performed and notable only for: constitutional: N/A  cardiovascular: N/A respiratory: N/A endocrine: N/A  ear/nose/throat: N/A  musculoskeletal: N/A skin: N/A genitourinary: N/A Gastrointestinal: N/A allergy/immunology: N/A neurological: N/A sleep: N/A psychiatric: N/A   ALLERGIES: Allergies  Allergen Reactions  . Codeine     Unknown  . Digoxin And Related     Unknown  . Diltiazem     Unknown  . Latex     Latex tape  . Metolazone     Unknown  . Morphine Sulfate     Unknown  . Penicillins     Unknown  . Septra Ds (Sulfamethoxazole W/Trimethoprim  (Co-Trimoxazole))     Unknown  . Spironolactone     Unknown  . Sulfa Drugs Cross Reactors     Unknown  . Taztia Xt (Diltiazem Hcl)     Unknown  . Tetracyclines & Related     Unknown  . Ticlid (Ticlopidine Hcl)     Unknown  . Verapamil     Unknown  . Vicodin (Hydrocodone-Acetaminophen)     Unknown    HOME MEDICATIONS: Outpatient Prescriptions Prior to Visit  Medication Sig Dispense Refill  . ALPRAZolam (XANAX) 1 MG tablet Take 0.5-1 mg by mouth 3 (three) times daily. Take 1mg  in the morning and 0.5 mg in the afternoon and at night      . Artificial Tear GEL Apply to eye. 2 drops as needed       . aspirin 325 MG EC tablet Take 325 mg by mouth daily.        . clopidogrel (PLAVIX) 75 MG tablet Take 75 mg by mouth daily. 1 tab daily      . ezetimibe (ZETIA) 10 MG tablet Take 10 mg by mouth daily.      . fluticasone (FLONASE) 50 MCG/ACT nasal spray Place 2 sprays into the nose as needed.       . furosemide (LASIX) 80 MG tablet Take 80 mg by mouth 2 (two) times daily.       . isosorbide mononitrate (IMDUR)  30 MG 24 hr tablet Take 30 mg by mouth daily.      [name] loratadine (CLARITIN) 10 MG tablet Take 10 mg by mouth daily.      . metoprolol (LOPRESSOR) 100 MG tablet Take 100 mg by mouth 2 (two) times daily. 1 tab twice a day      . Multiple Vitamin (MULTIVITAMIN WITH MINERALS) TABS Take 1 tablet by mouth daily.      . nitroGLYCERIN (NITROSTAT) 0.4 MG SL tablet Place 0.4 mg under the tongue every 5 (five) minutes as needed. For chest pain      . omeprazole (PRILOSEC) 40 MG capsule Take 40 mg by mouth daily.      . ondansetron (ZOFRAN) 8 MG tablet Take 8 mg by mouth every 8 (eight) hours as needed. For nausea      . oxybutynin (DITROPAN-XL) 10 MG 24 hr tablet Take 10 mg by mouth at bedtime.       . potassium chloride SA (K-DUR,KLOR-CON) 20 MEQ tablet Take 20 mEq by mouth 2 (two) times daily.       . ramipril (ALTACE) 10 MG capsule Take 10 mg by mouth 2 (two) times daily.       . simvastatin  (ZOCOR) 20 MG tablet Take 10 mg by mouth at bedtime. 1 tab daily      . sodium chloride (MURO 128) 5 % ophthalmic ointment Place 1 drop into both eyes daily as needed. For dry eyes      . traMADol (ULTRAM) 50 MG tablet Take 50 mg by mouth every 6 (six) hours as needed. For pain      . venlafaxine (EFFEXOR) 100 MG tablet Take 100-200 mg by mouth every morning. 2 tabs am 1 tab in pm      . topiramate (TOPAMAX) 100 MG tablet Take 0.5 tablets (50 mg total) by mouth 2 (two) times daily. Take 1/2 tablet at night x 1 week then twice daily  45 tablet  3  . albuterol (PROVENTIL HFA;VENTOLIN HFA) 108 (90 BASE) MCG/ACT inhaler Inhale 2 puffs into the lungs every 6 (six) hours as needed. For shortness of breath      . glipiZIDE (GLUCOTROL) 5 MG tablet Take 0.5 tablets (2.5 mg total) by mouth daily.  30 tablet  0  . insulin glargine (LANTUS SOLOSTAR) 100 UNIT/ML injection Inject 10 Units into the skin at bedtime. As needed, after checking blood sugar      . pantoprazole (PROTONIX) 40 MG tablet Take 40 mg by mouth daily.       No facility-administered medications prior to visit.    PAST MEDICAL HISTORY: Past Medical History  Diagnosis Date  . Ischemic cardiomyopathy     status post biventricular ICD placed by DR Edumunds who used to see Dr Melvern Banker here to establish  cardiovascular care.  . Hypertension   . Coronary artery disease   . CHF (congestive heart failure)   . MVP (mitral valve prolapse)     Antibiotics not required for procedures  . Asthma   . Anxiety   . Depression   . Hiatal hernia   . Cervical dysplasia   . Fibroid   . Function kidney decreased   . History of shingles   . Chronic kidney disease   . ICD (implantable cardiac defibrillator) in place   . Pacemaker   . Complication of anesthesia     "I wake up during surgeries" (02/14/2013)  . Anginal pain   . Myocardial infarction     "  I've had 2; the others they were able to catch before completing" (02/14/2013)  . Pneumonia 1950's &  1985  . Shortness of breath     "lying down flat; at times w/exertion" (02/14/2013)  . Type II diabetes mellitus   . Iron deficiency anemia   . GERD (gastroesophageal reflux disease)   . History of stomach ulcers   . Migraines   . Stroke     "2 confirmed; 9 TIA's; results in dragging LLE; numbness in tip of tongue" (02/14/2013)    PAST SURGICAL HISTORY: Past Surgical History  Procedure Laterality Date  . Insert / replace / remove pacemaker      biventricular defibrillator--06/10/ 2009  . Abdominal hysterectomy  1985    TAH   . Cardiac defibrillator placement    . Coronary angioplasty with stent placement      "started out w/5; bypass corrected some; 1 stent since the bypass" (02/14/2013)  . Breast excisional biopsy Left 01/2007; 06/2007; 03/2008    "benign" (02/14/2013)  . Cardiac catheterization      "probably in the teens" (02/14/2013)  . Colonoscopy  ~ 2002  . Tee without cardioversion  11/14/2012    Procedure: TRANSESOPHAGEAL ECHOCARDIOGRAM (TEE);  Surgeon: Candee Furbish, MD;  Location: [facility] ENDOSCOPY;  Service: Cardiovascular;  Laterality: N/A;  . Supraventricular tachycardia ablation  06/2007  . Biv icd genertaor change out  06/2007; 04/2008    "2 lead initial placement, at [facility] done at [facility], after developing CHF" (02/14/2013)  . Coronary artery bypass graft  ` 1998    "CABG X3" (02/14/2013)  . Breast surgery    . Dilation and curettage of uterus  1975 X 2; 1976; 1977    FAMILY HISTORY: Family History  Problem Relation Age of Onset  . Heart disease Mother   . Hypertension Mother   . Heart disease Father   . Hypertension Father   . Diabetes Father   . Kidney failure Father   . Heart disease Brother   . Diabetes Brother   . Kidney failure Brother   . Diabetes Paternal Grandmother     SOCIAL HISTORY: History   Social History  . Marital Status: Married    Spouse Name: [name]    Number of Children: 1  . Years of Education: MA   Occupational History  .      Disablity   Social  History Main Topics  . Smoking status: Never Smoker   . Smokeless tobacco: Never Used  . Alcohol Use: Yes     Comment: 02/14/2013 "glass of wine q blue moon/vacation"  . Drug Use: No  . Sexually Active: Yes    Birth Control/ Protection: Surgical   Other Topics Concern  . Not on file   Social History Narrative   Patient lives at home with spouse.     Daughter name is Tourist information centre manager.   Caffeine Use: none     PHYSICAL EXAM  Filed Vitals:   06/08/13 1456  BP: 116/71  Pulse: 70  Height: 5' 4.5" (1.638 m)  Weight: 155 lb (70.308 kg)   Body mass index is 26.2 kg/(m^2).  Generalized: In no acute distress, pleasant AA female.  Neck: Supple, no carotid bruits   Cardiac: Regular rate rhythm, no murmur   Pulmonary: Clear to auscultation bilaterally   Musculoskeletal: No deformity   Neurological examination   Mentation: Alert oriented to time, place, history taking, mild word hesitancy/nonfleuncy  Cranial nerve II-XII: Pupils were equal round reactive to light extraocular movements were full,  visual field were full on confrontational test. facial sensation and strength were normal. hearing was intact to finger rubbing bilaterally. Uvula tongue midline. head turning and shoulder shrug and were normal and symmetric.Tongue protrusion into cheek strength was normal. MOTOR: normal bulk and tone, full strength in the BUE, BLE, fine finger movements normal, no pronator drift SENSORY: normal and symmetric to light touch, pinprick, temperature, vibration and proprioception COORDINATION: finger-nose-finger, heel-to-shin bilaterally, there was no truncal ataxia REFLEXES: Brachioradialis 2/2, biceps 2/2, triceps 2/2, patellar 2/2, Achilles 2/2, plantar responses were flexor bilaterally. GAIT/STATION: Rising up from seated position without assistance, normal stance, without trunk ataxia, moderate stride, good arm swing, smooth turning, able to perform tiptoe, and heel walking without difficulty.     DIAGNOSTIC DATA (LABS, IMAGING, TESTING) - I reviewed patient records, labs, notes, testing and imaging myself where available.  Lab Results  Component Value Date   WBC 7.3 02/14/2013   HGB 11.2* 02/14/2013   HCT 33.0* 02/14/2013   MCV 83.6 02/14/2013   PLT 207 02/14/2013      Component Value Date/Time   NA 142 02/15/2013 0505   K 3.7 02/15/2013 0505   CL 101 02/15/2013 0505   CO2 32 02/15/2013 0505   GLUCOSE 145* 02/15/2013 0505   BUN 17 02/15/2013 0505   CREATININE 1.72* 02/15/2013 0505   CALCIUM 9.4 02/15/2013 0505   PROT 7.8 02/14/2013 0934   ALBUMIN 4.0 02/14/2013 0934   AST 34 02/14/2013 0934   ALT 31 02/14/2013 0934   ALKPHOS 68 02/14/2013 0934   BILITOT 0.6 02/14/2013 0934   GFRNONAA 31* 02/15/2013 0505   GFRAA 36* 02/15/2013 0505   Lab Results  Component Value Date   CHOL 178 02/15/2013   HDL 54 02/15/2013   LDLCALC 94 02/15/2013   TRIG 152* 02/15/2013   CHOLHDL 3.3 02/15/2013   Lab Results  Component Value Date   HGBA1C 6.8* 02/15/2013   No results found for this basename: [id number]   Lab Results  Component Value Date   TSH 0.618  07/05/2007   Ct Head Wo Contrast  02/14/2013 No acute finding by CT. Chronic small vessel changes of the hemispheric deep white matter.  CTA of neck 11/11/12:  Heavily calcified plaque in the left carotid bulb narrowing the lumen by 25% diameter stenosis. No significant right carotid stenosis. No significant vertebral artery stenosis. CTA head 11/11/12: No significant intracranial stenosis.  ASSESSMENT AND PLAN 62 y.o. AA female with likely stroke on 11/11/12.  Head CT normal, MRI unable due to AICD.  CTA shows a 25% left ICA stenosis. No other significant abnormalities noted. Patient with multiple vascular risk factors. On Plavix and ASA 325mg  at home.   Stroke Risk Factors - Paroxsymal atrial fibrillation, diabetes mellitus, hypertension, stroke, CAD s/p CABG.  Continue aspirin 325 mg orally every day and clopidogrel 75 mg orally every day  for secondary stroke  prevention and maintain strict control of hypertension with blood pressure goal below 130/90, diabetes with hemoglobin A1c goal below 6.5% and lipids with LDL cholesterol goal below 100 mg/dL.   Followup in 6 months.   Temara Lanum NP-C 06/08/2013, 3:09 PM  [facility] [address] [place], [place] [address] [phone]  I have personally examined this patient, reviewed pertinent data, developed plan of care and discussed with patient and agree with above.  Antony Contras, MD

## 2013-06-12 ENCOUNTER — Other Ambulatory Visit: Payer: Self-pay

## 2013-06-12 DIAGNOSIS — Z1231 Encounter for screening mammogram for malignant neoplasm of breast: Secondary | ICD-10-CM

## 2013-06-29 ENCOUNTER — Encounter: Payer: Self-pay | Admitting: *Deleted

## 2013-07-02 ENCOUNTER — Ambulatory Visit
Admission: RE | Admit: 2013-07-02 | Discharge: 2013-07-02 | Disposition: A | Discharge status: Home / Self Care | Payer: 59 | Source: Ambulatory Visit

## 2013-07-02 DIAGNOSIS — Z1231 Encounter for screening mammogram for malignant neoplasm of breast: Secondary | ICD-10-CM

## 2013-07-31 ENCOUNTER — Ambulatory Visit (INDEPENDENT_AMBULATORY_CARE_PROVIDER_SITE_OTHER): Payer: 59 | Admitting: *Deleted

## 2013-07-31 DIAGNOSIS — Z23 Encounter for immunization: Secondary | ICD-10-CM

## 2013-08-11 ENCOUNTER — Other Ambulatory Visit: Payer: Self-pay | Admitting: Cardiology

## 2013-08-11 DIAGNOSIS — I471 Supraventricular tachycardia: Secondary | ICD-10-CM

## 2013-08-13 ENCOUNTER — Encounter: Payer: Self-pay | Admitting: Internal Medicine

## 2013-08-16 MED ORDER — SIMVASTATIN 20 MG PO TABS: 20.0000 mg | ORAL_TABLET | Freq: Every day | ORAL | Status: DC

## 2013-08-16 MED ORDER — METOPROLOL TARTRATE 100 MG PO TABS: 100.0000 mg | ORAL_TABLET | Freq: Two times a day (BID) | ORAL | Status: DC

## 2013-08-16 MED ORDER — ISOSORBIDE MONONITRATE ER 30 MG PO TB24: 30.0000 mg | ORAL_TABLET | Freq: Every day | ORAL | Status: DC

## 2013-08-16 MED ORDER — EZETIMIBE 10 MG PO TABS: 10.0000 mg | ORAL_TABLET | Freq: Every day | ORAL | Status: DC

## 2013-08-22 ENCOUNTER — Encounter: Payer: Self-pay | Admitting: *Deleted

## 2013-09-11 ENCOUNTER — Encounter: Payer: Self-pay | Admitting: Internal Medicine

## 2013-09-11 ENCOUNTER — Ambulatory Visit (INDEPENDENT_AMBULATORY_CARE_PROVIDER_SITE_OTHER): Payer: 59 | Admitting: Internal Medicine

## 2013-09-11 VITALS — BP 125/82 | HR 76 | Ht 64.5 in | Wt 165.0 lb

## 2013-09-11 DIAGNOSIS — I5022 Chronic systolic (congestive) heart failure: Secondary | ICD-10-CM

## 2013-09-11 DIAGNOSIS — I471 Supraventricular tachycardia: Secondary | ICD-10-CM

## 2013-09-11 LAB — ICD DEVICE OBSERVATION
AL IMPEDENCE ICD: 512 Ohm
ATRIAL PACING ICD: 1.2 pct
BAMS-0001: 150 {beats}/min
BATTERY VOLTAGE: 2.81 V
LV LEAD IMPEDENCE ICD: 744 Ohm
LV LEAD THRESHOLD: 1.5 V
MODE SWITCH EPISODES: 0
PACEART VT: 0
RV LEAD AMPLITUDE: 4.1 mv
RV LEAD IMPEDENCE ICD: 448 Ohm
RV LEAD THRESHOLD: 1 V
TZAT-0001ATACH: 1
TZAT-0001FASTVT: 1
TZAT-0002ATACH: NEGATIVE
TZAT-0005SLOWVT: 88 pct
TZAT-0011SLOWVT: 10 ms
TZAT-0011SLOWVT: 10 ms
TZAT-0012ATACH: 150 ms
TZAT-0012SLOWVT: 200 ms
TZAT-0012SLOWVT: 200 ms
TZAT-0013SLOWVT: 2
TZAT-0013SLOWVT: 2
TZAT-0018ATACH: NEGATIVE
TZAT-0018FASTVT: NEGATIVE
TZAT-0018SLOWVT: NEGATIVE
TZAT-0019ATACH: 6 V
TZAT-0019SLOWVT: 8 V
TZAT-0019SLOWVT: 8 V
TZAT-0020ATACH: 1.5 ms
TZAT-0020ATACH: 1.5 ms
TZAT-0020FASTVT: 1.5 ms
TZON-0003ATACH: 400 ms
TZON-0003SLOWVT: 350 ms
TZON-0004SLOWVT: 32
TZON-0005SLOWVT: 12
TZST-0001ATACH: 4
TZST-0001ATACH: 5
TZST-0001FASTVT: 2
TZST-0001FASTVT: 3
TZST-0001FASTVT: 4
TZST-0001SLOWVT: 4
TZST-0001SLOWVT: 6
TZST-0002ATACH: NEGATIVE
TZST-0002ATACH: NEGATIVE
TZST-0002ATACH: NEGATIVE
TZST-0002FASTVT: NEGATIVE
TZST-0002FASTVT: NEGATIVE
TZST-0003SLOWVT: 35 J
TZST-0003SLOWVT: 35 J
TZST-0003SLOWVT: 35 J
TZST-0003SLOWVT: 35 J
VENTRICULAR PACING ICD: 99.9 pct
VF: 0

## 2013-09-11 NOTE — Assessment & Plan Note (Signed)
Her [employer] biventricular ICD is working normally. We'll plan to recheck in several months.

## 2013-09-11 NOTE — Patient Instructions (Addendum)
Remote monitoring is used to monitor your  ICD from home. This monitoring reduces the number of office visits required to check your device to one time per year. It allows [place] to keep an eye on the functioning of your device to ensure it is working properly. You are scheduled for a device check from home on 12-13-2013. You may send your transmission at any time that day. If you have a wireless device, the transmission will be sent automatically. After your physician reviews your transmission, you will receive a postcard with your next transmission date.  Your physician recommends that you schedule a follow-up appointment in: 1 YEAR  Dr Lovena Le

## 2013-09-11 NOTE — Progress Notes (Signed)
HPI [name] returns today for followup. She is a very pleasant 62 year old woman with a history of premature coronary disease, and ischemic cardiomyopathy, chronic systolic heart failure, status post ICD implantation. In the interim, the patient notes increased stress. She denies shortness of breath. No ICD shocks. When she's under stress, she will feel some chest discomfort. No ICD discharge.  Allergies  Allergen Reactions  . Codeine     Unknown  . Digoxin And Related     Unknown  . Diltiazem     Unknown  . Latex     Latex tape  . Metolazone     Unknown  . Morphine Sulfate     Unknown  . Penicillins     Unknown  . Septra Ds [Sulfamethoxazole W/Trimethoprim (Co-Trimoxazole)]     Unknown  . Spironolactone     Unknown  . Sulfa Drugs Cross Reactors     Unknown  . Rema Fendt [Diltiazem Hcl]     Unknown  . Tetracyclines & Related     Unknown  . Ticlid [Ticlopidine Hcl]     Unknown  . Verapamil     Unknown  . Vicodin [Hydrocodone-Acetaminophen]     Unknown     Current Outpatient Prescriptions  Medication Sig Dispense Refill  . albuterol (PROVENTIL HFA;VENTOLIN HFA) 108 (90 BASE) MCG/ACT inhaler Inhale 2 puffs into the lungs every 4 (four) hours as needed for wheezing or shortness of breath.      . ALPRAZolam (XANAX) 1 MG tablet Take 0.5-1 mg by mouth every 6 (six) hours.       . Artificial Tear GEL Apply to eye. 2 drops as needed       . aspirin 325 MG EC tablet Take 325 mg by mouth daily.        . cetirizine (ZYRTEC) 10 MG tablet Take 10 mg by mouth daily.      [name] CINNAMON PO Take 1,000 mg by mouth daily.      . clopidogrel (PLAVIX) 75 MG tablet Take 1 tablet by mouth  daily  90 tablet  2  . ezetimibe (ZETIA) 10 MG tablet Take 1 tablet (10 mg total) by mouth daily.  90 tablet  2  . fluticasone (FLONASE) 50 MCG/ACT nasal spray Place 2 sprays into the nose as needed.       . Fluticasone-Salmeterol (ADVAIR) 100-50 MCG/DOSE AEPB Inhale 1 puff into the lungs as  needed.      . furosemide (LASIX) 80 MG tablet Take 80 mg by mouth 2 (two) times daily.       . isosorbide mononitrate (IMDUR) 30 MG 24 hr tablet Take 1 tablet (30 mg total) by mouth daily.  90 tablet  2  . metoprolol (LOPRESSOR) 100 MG tablet Take 1 tablet (100 mg total) by mouth 2 (two) times daily. 1 tab twice a day  180 tablet  2  . Multiple Vitamin (MULTIVITAMIN WITH MINERALS) TABS Take 1 tablet by mouth daily.      . nitroGLYCERIN (NITROSTAT) 0.4 MG SL tablet Place 0.4 mg under the tongue every 5 (five) minutes as needed. For chest pain      . omeprazole (PRILOSEC) 40 MG capsule Take 40 mg by mouth daily.      . ondansetron (ZOFRAN) 8 MG tablet Take 8 mg by mouth every 8 (eight) hours as needed. For nausea      . oxybutynin (DITROPAN-XL) 10 MG 24 hr tablet Take 10 mg by mouth at bedtime.       [name]  potassium chloride SA (K-DUR,KLOR-CON) 20 MEQ tablet Take 20 mEq by mouth 2 (two) times daily.       . ramipril (ALTACE) 10 MG capsule Take 1 capsule by mouth  twice daily  180 capsule  2  . simvastatin (ZOCOR) 20 MG tablet Take 1 tablet (20 mg total) by mouth at bedtime. 1 tab daily  90 tablet  2  . sodium chloride (MURO 128) 5 % ophthalmic ointment Place 1 drop into both eyes daily as needed. For dry eyes      . traMADol (ULTRAM) 50 MG tablet Take 100 mg by mouth every 6 (six) hours as needed. For pain      . venlafaxine (EFFEXOR) 100 MG tablet 2 tabs am 1 tab in pm       No current facility-administered medications for this visit.     Past Medical History  Diagnosis Date  . Ischemic cardiomyopathy     status post biventricular ICD placed by DR Edumunds who used to see Dr Melvern Banker here to establish  cardiovascular care.  . Hypertension   . Coronary artery disease   . CHF (congestive heart failure)   . MVP (mitral valve prolapse)     Antibiotics not required for procedures  . Asthma   . Anxiety   . Depression   . Hiatal hernia   . Cervical dysplasia   . Fibroid   . Function kidney  decreased   . History of shingles   . Chronic kidney disease   . ICD (implantable cardiac defibrillator) in place   . Pacemaker   . Complication of anesthesia     "I wake up during surgeries" (02/14/2013)  . Anginal pain   . Myocardial infarction     "I've had 2; the others they were able to catch before completing" (02/14/2013)  . Pneumonia 1950's & 1985  . Shortness of breath     "lying down flat; at times w/exertion" (02/14/2013)  . Type II diabetes mellitus   . Iron deficiency anemia   . GERD (gastroesophageal reflux disease)   . History of stomach ulcers   . Migraines   . Stroke     "2 confirmed; 9 TIA's; results in dragging LLE; numbness in tip of tongue" (02/14/2013)    ROS:   All systems reviewed and negative except as noted in the HPI.   Past Surgical History  Procedure Laterality Date  . Insert / replace / remove pacemaker      biventricular defibrillator--06/10/ 2009  . Abdominal hysterectomy  1985    TAH   . Cardiac defibrillator placement    . Coronary angioplasty with stent placement      "started out w/5; bypass corrected some; 1 stent since the bypass" (02/14/2013)  . Breast excisional biopsy Left 01/2007; 06/2007; 03/2008    "benign" (02/14/2013)  . Cardiac catheterization      "probably in the teens" (02/14/2013)  . Colonoscopy  ~ 2002  . Tee without cardioversion  11/14/2012    Procedure: TRANSESOPHAGEAL ECHOCARDIOGRAM (TEE);  Surgeon: Candee Furbish, MD;  Location: [facility] ENDOSCOPY;  Service: Cardiovascular;  Laterality: N/A;  . Supraventricular tachycardia ablation  06/2007  . Biv icd genertaor change out  06/2007; 04/2008    "2 lead initial placement, at [facility]; done at [facility], after developing CHF" (02/14/2013)  . Coronary artery bypass graft  ` 1998    "CABG X3" (02/14/2013)  . Breast surgery    . Dilation and curettage of uterus  1975 X 2; 1976; 1977  Family History  Problem Relation Age of Onset  . Heart disease Mother   . Hypertension Mother   . Heart disease Father     . Hypertension Father   . Diabetes Father   . Kidney failure Father   . Heart disease Brother   . Diabetes Brother   . Kidney failure Brother   . Diabetes Paternal Grandmother      History   Social History  . Marital Status: Married    Spouse Name: [name]    Number of Children: 1  . Years of Education: MA   Occupational History  .      Disablity   Social History Main Topics  . Smoking status: Never Smoker   . Smokeless tobacco: Never Used  . Alcohol Use: Yes     Comment: 02/14/2013 "glass of wine q blue moon/vacation"  . Drug Use: No  . Sexual Activity: Yes    Birth Control/ Protection: Surgical   Other Topics Concern  . Not on file   Social History Narrative   Patient lives at home with spouse.     Daughter name is Tourist information centre manager.   Caffeine Use: none     BP 125/82  Pulse 76  Ht 5' 4.5" (1.638 m)  Wt 165 lb (74.844 kg)  BMI 27.90 kg/m2  Physical Exam:  Well appearing 62 year-old woman,NAD HEENT: Unremarkable Neck:  No JVD, no thyromegally Back:  No CVA tenderness Lungs:  Clear with no wheezes, rales, or rhonchi. HEART:  Regular rate rhythm, no murmurs, no rubs, no clicks Abd:  soft, positive bowel sounds, no organomegally, no rebound, no guarding Ext:  2 plus pulses, no edema, no cyanosis, no clubbing Skin:  No rashes no nodules Neuro:  CN II through XII intact, motor grossly intact  EKG -  Normal sinus rhythm with biventricular pacing  DEVICE  Normal device function.  See PaceArt for details.   Assess/Plan:

## 2013-09-11 NOTE — Assessment & Plan Note (Signed)
Her heart failure symptoms are class II. She'll continue her current medical therapy, and she will maintain a low-sodium diet.

## 2013-09-26 ENCOUNTER — Ambulatory Visit: Payer: 59 | Admitting: Cardiology

## 2013-10-08 ENCOUNTER — Encounter: Payer: Self-pay | Admitting: Interventional Cardiology

## 2013-10-08 ENCOUNTER — Ambulatory Visit (INDEPENDENT_AMBULATORY_CARE_PROVIDER_SITE_OTHER): Payer: 59 | Admitting: Interventional Cardiology

## 2013-10-08 VITALS — BP 126/86 | HR 75 | Ht 64.5 in | Wt 164.0 lb

## 2013-10-08 DIAGNOSIS — Z95810 Presence of automatic (implantable) cardiac defibrillator: Secondary | ICD-10-CM

## 2013-10-08 DIAGNOSIS — I5022 Chronic systolic (congestive) heart failure: Secondary | ICD-10-CM

## 2013-10-08 DIAGNOSIS — N189 Chronic kidney disease, unspecified: Secondary | ICD-10-CM

## 2013-10-08 DIAGNOSIS — I2510 Atherosclerotic heart disease of native coronary artery without angina pectoris: Secondary | ICD-10-CM

## 2013-10-08 DIAGNOSIS — I10 Essential (primary) hypertension: Secondary | ICD-10-CM

## 2013-10-08 DIAGNOSIS — G459 Transient cerebral ischemic attack, unspecified: Secondary | ICD-10-CM

## 2013-10-08 MED ORDER — NITROGLYCERIN 0.4 MG SL SUBL: 0.4000 mg | SUBLINGUAL_TABLET | SUBLINGUAL | Status: DC | PRN

## 2013-10-08 MED ORDER — RAMIPRIL 10 MG PO CAPS: ORAL_CAPSULE | ORAL | Status: DC

## 2013-10-08 MED ORDER — EZETIMIBE 10 MG PO TABS: 10.0000 mg | ORAL_TABLET | Freq: Every day | ORAL | Status: DC

## 2013-10-08 NOTE — Progress Notes (Signed)
Patient ID: FYNLEE CORIELL, female   DOB: [birth year], 62 y.o.   MRN: [id number]    [address] [place], [place]  [address] Phone: [phone] Fax:  [phone]  Date:  10/08/2013   ID:  [name], DOB 1951-10-18, MRN [id number]  PCP:  Reginia Naas, MD   ASSESSMENT:  1. Coronary artery disease, with stable angina. Prior coronary artery bypass grafting 2. Chronic combined systolic and diastolic heart failure, stable 3. Hypertension, under reasonable control 4. AICD/biventricular pacer, with normal function 5. Chronic kidney disease  PLAN:  1. Continue current antianginal therapy 2. Continue current heart disease therapy 3. Low fat low salt diet 4. Continue followup in device clinic 5. Clinical followup in 6 months   SUBJECTIVE: [name] is a 62 y.o. female who is new to me. She has been previously followed by Dr. Melvern Banker, Dr. Leonia Reeves, and most recently Dr. Marlou Porch. She has stable angina. Angina is responsive to nitroglycerin. She denies dyspnea, orthopnea, or peripheral edema. She has not had an AICD discharge. The AICD, coronary intervention, and screw per vascular disease history is very complex. The patient has had previous coronary stenting. She subsequently had coronary artery bypass grafting in 1998. She subsequently developed systolic heart failure and had a by the AICD implanted by Dr. Leonia Reeves. Her LV function has subsequently improved to an EF greater than 40% when last assessed proximally 10 months ago. In 2014 she was hospitalized 3 times with transient neurological symptoms that were very slowly labeled TIA versus stroke. She has had no recent cerebrovascular events. She denies orthopnea, PND, and syncope.   Wt Readings from Last 3 Encounters:  10/08/13 164 lb (74.39 kg)  09/11/13 165 lb (74.844 kg)  06/08/13 155 lb (70.308 kg)     Past Medical History  Diagnosis Date  . Ischemic cardiomyopathy     status post  biventricular ICD placed by DR Edumunds who used to see Dr Melvern Banker here to establish  cardiovascular care.  . Hypertension   . Coronary artery disease   . CHF (congestive heart failure)   . MVP (mitral valve prolapse)     Antibiotics not required for procedures  . Asthma   . Anxiety   . Depression   . Hiatal hernia   . Cervical dysplasia   . Fibroid   . Function kidney decreased   . History of shingles   . Chronic kidney disease   . ICD (implantable cardiac defibrillator) in place   . Pacemaker   . Complication of anesthesia     "I wake up during surgeries" (02/14/2013)  . Anginal pain   . Myocardial infarction     "I've had 2; the others they were able to catch before completing" (02/14/2013)  . Pneumonia 1950's & 1985  . Shortness of breath     "lying down flat; at times w/exertion" (02/14/2013)  . Type II diabetes mellitus   . Iron deficiency anemia   . GERD (gastroesophageal reflux disease)   . History of stomach ulcers   . Migraines   . Stroke     "2 confirmed; 9 TIA's; results in dragging LLE; numbness in tip of tongue" (02/14/2013)    Current Outpatient Prescriptions  Medication Sig Dispense Refill  . albuterol (PROVENTIL HFA;VENTOLIN HFA) 108 (90 BASE) MCG/ACT inhaler Inhale 2 puffs into the lungs every 4 (four) hours as needed for wheezing or shortness of breath.      . ALPRAZolam (XANAX) 1 MG tablet Take  0.5-1 mg by mouth every 6 (six) hours.       . Artificial Tear GEL Apply to eye. 2 drops as needed       . aspirin 325 MG EC tablet Take 325 mg by mouth daily.        . cetirizine (ZYRTEC) 10 MG tablet Take 10 mg by mouth daily.      [name] CINNAMON PO Take 1,000 mg by mouth daily.      . clopidogrel (PLAVIX) 75 MG tablet Take 1 tablet by mouth  daily  90 tablet  2  . ezetimibe (ZETIA) 10 MG tablet Take 1 tablet (10 mg total) by mouth daily.  90 tablet  2  . fluticasone (FLONASE) 50 MCG/ACT nasal spray Place 2 sprays into the nose as needed.       . Fluticasone-Salmeterol  (ADVAIR) 100-50 MCG/DOSE AEPB Inhale 1 puff into the lungs as needed.      . furosemide (LASIX) 80 MG tablet Take 80 mg by mouth 2 (two) times daily.       . isosorbide mononitrate (IMDUR) 30 MG 24 hr tablet Take 1 tablet (30 mg total) by mouth daily.  90 tablet  2  . metoprolol (LOPRESSOR) 100 MG tablet Take 1 tablet (100 mg total) by mouth 2 (two) times daily. 1 tab twice a day  180 tablet  2  . Multiple Vitamin (MULTIVITAMIN WITH MINERALS) TABS Take 1 tablet by mouth daily.      . nitroGLYCERIN (NITROSTAT) 0.4 MG SL tablet Place 0.4 mg under the tongue every 5 (five) minutes as needed. For chest pain      . omeprazole (PRILOSEC) 40 MG capsule Take 40 mg by mouth daily.      . ondansetron (ZOFRAN) 8 MG tablet Take 8 mg by mouth every 8 (eight) hours as needed. For nausea      . oxybutynin (DITROPAN-XL) 10 MG 24 hr tablet Take 10 mg by mouth at bedtime.       . potassium chloride SA (K-DUR,KLOR-CON) 20 MEQ tablet Take 20 mEq by mouth 2 (two) times daily.       . ramipril (ALTACE) 10 MG capsule Take 1 capsule by mouth  twice daily  180 capsule  2  . simvastatin (ZOCOR) 20 MG tablet Take 1 tablet (20 mg total) by mouth at bedtime. 1 tab daily  90 tablet  2  . sodium chloride (MURO 128) 5 % ophthalmic ointment Place 1 drop into both eyes daily as needed. For dry eyes      . traMADol (ULTRAM) 50 MG tablet Take 100 mg by mouth every 6 (six) hours as needed. For pain      . venlafaxine (EFFEXOR) 100 MG tablet 2 tabs am 1 tab in pm       No current facility-administered medications for this visit.    Allergies:    Allergies  Allergen Reactions  . Codeine     Unknown  . Digoxin And Related     Unknown  . Diltiazem     Unknown  . Latex     Latex tape  . Metolazone     Unknown  . Morphine Sulfate     Unknown  . Penicillins     Unknown  . Septra Ds [Sulfamethoxazole W/Trimethoprim (Co-Trimoxazole)]     Unknown  . Spironolactone     Unknown  . Sulfa Drugs Cross Reactors     Unknown  .  Rema Fendt [Diltiazem Hcl]  Unknown  . Tetracyclines & Related     Unknown  . Ticlid [Ticlopidine Hcl]     Unknown  . Verapamil     Unknown  . Vicodin [Hydrocodone-Acetaminophen]     Unknown   Past Surgical History  Procedure Laterality Date  . Insert / replace / remove pacemaker      biventricular defibrillator--06/10/ 2009  . Abdominal hysterectomy  1985    TAH   . Cardiac defibrillator placement    . Coronary angioplasty with stent placement      "started out w/5; bypass corrected some; 1 stent since the bypass" (02/14/2013)  . Breast excisional biopsy Left 01/2007; 06/2007; 03/2008    "benign" (02/14/2013)  . Cardiac catheterization      "probably in the teens" (02/14/2013)  . Colonoscopy  ~ 2002  . Tee without cardioversion  11/14/2012    Procedure: TRANSESOPHAGEAL ECHOCARDIOGRAM (TEE);  Surgeon: Candee Furbish, MD;  Location: [facility];  Service: Cardiovascular;  Laterality: N/A;  . Supraventricular tachycardia ablation  06/2007  . Biv icd genertaor change out  06/2007; 04/2008    "2 lead initial placement, at [facility]; done at [facility], after developing CHF" (02/14/2013)  . Coronary artery bypass graft  ` 1998    "CABG X3" (02/14/2013)  . Breast surgery    . Dilation and curettage of uterus  1975 X 2; 1976; 1977   Social History:  The patient  reports that she has never smoked. She has never used smokeless tobacco. She reports that she drinks alcohol. She reports that she does not use illicit drugs.   ROS:  Please see the history of present illness.   Denies headache, weight loss, edema, insomnia, and bleeding.   All other systems reviewed and negative.   OBJECTIVE: VS:  BP 126/86  Pulse 75  Ht 5' 4.5" (1.638 m)  Wt 164 lb (74.39 kg)  BMI 27.73 kg/m2  SpO2 98% Well nourished, well developed, in no acute distress, appears her stated age 53: normal Neck: JVD flat. Carotid bruit absent  Cardiac:  normal S1, S2; RRR; 2 of 6 systolic murmur at right upper sternal border and left  midsternal border Lungs:  clear to auscultation bilaterally, no wheezing, rhonchi or rales Abd: soft, nontender, no hepatomegaly Ext: Edema absent. Pulses 2+ Skin: warm and dry Neuro:  CNs 2-12 intact, no focal abnormalities noted  EKG:  Not performed.       Signed, Illene Labrador III, MD 10/08/2013 3:37 PM

## 2013-10-08 NOTE — Patient Instructions (Signed)
Your physician recommends that you continue on your current medications as directed. Please refer to the Current Medication list given to you today.  Your physician wants you to follow-up in: 6 months You will receive a reminder letter in the mail two months in advance. If you don't receive a letter, please call our office to schedule the follow-up appointment.  Your refills have been sent to your pharmacy for [facility]

## 2013-11-21 ENCOUNTER — Encounter: Payer: Self-pay | Admitting: Nurse Practitioner

## 2013-11-26 ENCOUNTER — Other Ambulatory Visit: Payer: Self-pay | Admitting: *Deleted

## 2013-11-26 MED ORDER — POTASSIUM CHLORIDE ER 10 MEQ PO TBCR: 20.0000 meq | EXTENDED_RELEASE_TABLET | Freq: Two times a day (BID) | ORAL | Status: DC

## 2013-11-26 NOTE — Telephone Encounter (Signed)
Patient stated that she takes potassium 74meq tablets. She takes 2 tablets bid. I will send in accordingly.

## 2013-12-11 ENCOUNTER — Ambulatory Visit: Payer: 59 | Admitting: Nurse Practitioner

## 2013-12-13 ENCOUNTER — Ambulatory Visit (INDEPENDENT_AMBULATORY_CARE_PROVIDER_SITE_OTHER): Payer: 59 | Admitting: *Deleted

## 2013-12-13 DIAGNOSIS — I471 Supraventricular tachycardia: Secondary | ICD-10-CM

## 2013-12-13 DIAGNOSIS — I5022 Chronic systolic (congestive) heart failure: Secondary | ICD-10-CM

## 2013-12-13 LAB — MDC_IDC_ENUM_SESS_TYPE_REMOTE
Brady Statistic AP VP Percent: 1 %
Brady Statistic AP VS Percent: 0.1 %
Brady Statistic AS VP Percent: 98.9 %
Brady Statistic AS VS Percent: 0.1 %
Lead Channel Impedance Value: 448 Ohm
Lead Channel Impedance Value: 528 Ohm
Lead Channel Pacing Threshold Amplitude: 1.5 V
Lead Channel Pacing Threshold Pulse Width: 0.4 ms
Lead Channel Sensing Intrinsic Amplitude: 2.6 mV
Lead Channel Setting Pacing Amplitude: 2 V
Lead Channel Setting Pacing Amplitude: 2.5 V
Lead Channel Setting Pacing Amplitude: 2.5 V
Lead Channel Setting Pacing Pulse Width: 0.4 ms
Lead Channel Setting Pacing Pulse Width: 0.4 ms
Lead Channel Setting Sensing Sensitivity: 0.3 mV
Zone Setting Detection Interval: 300 ms
Zone Setting Detection Interval: 350 ms
Zone Setting Detection Interval: 400 ms
Zone Setting Detection Interval: 430 ms

## 2013-12-21 ENCOUNTER — Other Ambulatory Visit: Payer: Self-pay | Admitting: Cardiology

## 2014-01-01 ENCOUNTER — Encounter: Payer: Self-pay | Admitting: *Deleted

## 2014-01-09 ENCOUNTER — Encounter: Payer: Self-pay | Admitting: Internal Medicine

## 2014-01-21 DIAGNOSIS — F4323 Adjustment disorder with mixed anxiety and depressed mood: Secondary | ICD-10-CM | POA: Diagnosis not present

## 2014-01-22 ENCOUNTER — Other Ambulatory Visit: Payer: Self-pay

## 2014-01-22 DIAGNOSIS — I471 Supraventricular tachycardia: Secondary | ICD-10-CM

## 2014-01-22 MED ORDER — CLOPIDOGREL BISULFATE 75 MG PO TABS: ORAL_TABLET | ORAL | Status: DC

## 2014-01-22 MED ORDER — SIMVASTATIN 20 MG PO TABS: 20.0000 mg | ORAL_TABLET | Freq: Every day | ORAL | Status: DC

## 2014-01-22 MED ORDER — METOPROLOL TARTRATE 100 MG PO TABS: 100.0000 mg | ORAL_TABLET | Freq: Two times a day (BID) | ORAL | Status: DC

## 2014-02-11 ENCOUNTER — Ambulatory Visit (INDEPENDENT_AMBULATORY_CARE_PROVIDER_SITE_OTHER): Payer: 59 | Admitting: Nurse Practitioner

## 2014-02-11 ENCOUNTER — Encounter: Payer: Self-pay | Admitting: Nurse Practitioner

## 2014-02-11 ENCOUNTER — Encounter (INDEPENDENT_AMBULATORY_CARE_PROVIDER_SITE_OTHER): Payer: Self-pay

## 2014-02-11 VITALS — BP 146/83 | HR 81 | Temp 99.3°F | Ht 64.5 in

## 2014-02-11 DIAGNOSIS — G459 Transient cerebral ischemic attack, unspecified: Secondary | ICD-10-CM | POA: Diagnosis not present

## 2014-02-11 DIAGNOSIS — I6529 Occlusion and stenosis of unspecified carotid artery: Secondary | ICD-10-CM

## 2014-02-11 NOTE — Progress Notes (Deleted)
Subjective:    Patient ID: [name] is a 63 y.o. female.  HPI {Common ambulatory SmartLinks:[id number]}  Review of Systems  Constitutional: Positive for fatigue and unexpected weight change.  HENT: Positive for ear pain and trouble swallowing.   Eyes: Positive for photophobia, redness and itching.  Respiratory: Positive for shortness of breath.   Cardiovascular: Positive for chest pain.  Gastrointestinal: Positive for constipation and rectal pain.  Endocrine: Positive for polydipsia.  Genitourinary: Positive for frequency and enuresis.  Musculoskeletal: Positive for arthralgias and back pain.  Skin: Negative.   Allergic/Immunologic: Positive for food allergies.  Neurological: Positive for dizziness, speech difficulty and numbness.       Memory  Hematological: Bruises/bleeds easily.       Anemia  Psychiatric/Behavioral: Positive for sleep disturbance (insomnis, frequent waking, sleep talking), dysphoric mood and agitation. The patient is nervous/anxious.     Objective:  Neurologic Exam  Physical Exam  Assessment:   ***  Plan:   ***

## 2014-02-11 NOTE — Patient Instructions (Addendum)
Continue aspirin 325 mg orally every day and clopidogrel 75 mg orally every day for secondary stroke prevention and maintain strict control of hypertension with blood pressure goal below 130/90, diabetes with hemoglobin A1c goal below 6.5% and lipids with LDL cholesterol goal below 100 mg/dL.   We will order a repeat Caroltid Doppler ultrasound.  Someone will call you to schedule. Followup in 6 months.

## 2014-02-11 NOTE — Progress Notes (Signed)
PATIENT: Assetou Kalb Swango DOB: 06-12-1951  REASON FOR VISIT: routine stroke follow up HISTORY FROM: patient  HISTORY OF PRESENT ILLNESS: Denetrice Starck Neeb is an 63 y.o. female who on 11/11/12 awoke with left sided weakness and difficulty with her speech.  Although patient could not have a MRI due to AICD it was felt by stroke team she did suffer a CVA. At that time TEE was negative for thrombus or PFO and it was felt Pradaxa was not indicated and was placed on both Plavix and ASA. Today patient was in cardiac rehab when at 0830 she noted left arm paresthesia which then progressed to both left arm and leg over a 20 minute period of time. Code stroke was called and patient was brought to CT. CT head was negative for acute CVA. On examination her NIHSS was 1 and patient continued to feel left arm, leg and face decreased sensation.   UPDATE 06/08/13 (LL): Patient comes to office for stroke follow up. She reports that she is doing very well and has had no new TIA symptoms (left arm and leg weakness) since the last episode while at cardiac rehab on 02/15/13. She has mild word hesitancy when she speaks. She takes Plavix and ASA 325 mg daily. Patient denies medication side effects, with no signs of bleeding or bruising. No new complaints.   UPDATE 02/11/14 (LL): Patient comes to office for stroke follow up. She is doing well without any repeat stroke or TIA symptoms.  Her husband, daughter, and brother have all had heart problems recently and she is helping to care for all of them.  Her daughter had to have an pacemaker implanted, her brother and husband both had MIs and needed bypass surgery.  She takes Plavix and ASA 325 mg daily. Patient denies medication side effects, with no signs of bleeding or bruising. No new complaints.   REVIEW OF SYSTEMS: Full 14 system review of systems performed and notable only for: fatigue, unexpected weight change, ear pain, trouble swallowing, eye itching, eye redness,  light sensitivity, shortness of breath, chest pain, excessive thirst, constipation, rectal pain, insomnia, frequent waking, sleep talking, food allergies, incontinence, frequency of urination, joint pain, back pain, anemia, bruise easily, memory loss, dizziness, numbness, speech difficulty, agitation, depression, anxiety  ALLERGIES: Allergies  Allergen Reactions  . Codeine     Unknown  . Digoxin And Related     Unknown  . Diltiazem     Unknown  . Latex     Latex tape  . Metolazone     Unknown  . Morphine Sulfate     Unknown  . Penicillins     Unknown  . Septra Ds [Sulfamethoxazole W/Trimethoprim (Co-Trimoxazole)]     Unknown  . Spironolactone     Unknown  . Sulfa Drugs Cross Reactors     Unknown  . Rema Fendt [Diltiazem Hcl]     Unknown  . Tetracyclines & Related     Unknown  . Ticlid [Ticlopidine Hcl]     Unknown  . Verapamil     Unknown  . Vicodin [Hydrocodone-Acetaminophen]     Unknown    HOME MEDICATIONS: Outpatient Prescriptions Prior to Visit  Medication Sig Dispense Refill  . albuterol (PROVENTIL HFA;VENTOLIN HFA) 108 (90 BASE) MCG/ACT inhaler Inhale 2 puffs into the lungs every 4 (four) hours as needed for wheezing or shortness of breath.      . ALPRAZolam (XANAX) 1 MG tablet Take 0.5-1 mg by mouth 3 (three) times daily as  needed (1 tab in the AM, 0.5 tab at noon and 0.5 tab in the PM).       . Artificial Tear GEL Apply to eye. 2 drops as needed       . aspirin 325 MG EC tablet Take 325 mg by mouth daily.        . cetirizine (ZYRTEC) 10 MG tablet Take 10 mg by mouth daily.      [name] CINNAMON PO Take 1,000 mg by mouth daily.      . clopidogrel (PLAVIX) 75 MG tablet Take 1 tablet by mouth  daily  90 tablet  2  . ezetimibe (ZETIA) 10 MG tablet Take 1 tablet (10 mg total) by mouth daily.  90 tablet  3  . fluticasone (FLONASE) 50 MCG/ACT nasal spray Place 2 sprays into the nose as needed.       . Fluticasone-Salmeterol (ADVAIR) 100-50 MCG/DOSE AEPB Inhale 1 puff into  the lungs as needed.      . furosemide (LASIX) 80 MG tablet Take 1 tablet by mouth two  times daily  180 tablet  1  . isosorbide mononitrate (IMDUR) 30 MG 24 hr tablet Take 1 tablet (30 mg total) by mouth daily.  90 tablet  2  . metoprolol (LOPRESSOR) 100 MG tablet Take 1 tablet (100 mg total) by mouth 2 (two) times daily. 1 tab twice a day  180 tablet  2  . Multiple Vitamin (MULTIVITAMIN WITH MINERALS) TABS Take 1 tablet by mouth daily.      . nitroGLYCERIN (NITROSTAT) 0.4 MG SL tablet Place 1 tablet (0.4 mg total) under the tongue every 5 (five) minutes as needed. For chest pain  25 tablet  3  . omeprazole (PRILOSEC) 40 MG capsule Take 40 mg by mouth daily.      . ondansetron (ZOFRAN) 8 MG tablet Take 8 mg by mouth every 8 (eight) hours as needed. For nausea      . oxybutynin (DITROPAN-XL) 10 MG 24 hr tablet Take 10 mg by mouth at bedtime.       . potassium chloride (K-DUR) 10 MEQ tablet Take 2 tablets (20 mEq total) by mouth 2 (two) times daily.  360 tablet  1  . ramipril (ALTACE) 10 MG capsule Take 1 capsule by mouth  twice daily  180 capsule  3  . simvastatin (ZOCOR) 20 MG tablet Take 1 tablet (20 mg total) by mouth at bedtime. 1 tab daily  90 tablet  2  . sodium chloride (MURO 128) 5 % ophthalmic ointment Place 1 drop into both eyes daily as needed. For dry eyes      . traMADol (ULTRAM) 50 MG tablet Take 100 mg by mouth every 6 (six) hours as needed. For pain      . venlafaxine (EFFEXOR) 100 MG tablet 2 tabs am 1 tab in pm      . potassium chloride SA (K-DUR,KLOR-CON) 20 MEQ tablet Take 20 mEq by mouth 2 (two) times daily.        No facility-administered medications prior to visit.     PHYSICAL EXAM  Filed Vitals:   02/11/14 1057  BP: 146/83  Pulse: 81  Temp: 99.3 F (37.4 C)  TempSrc: Oral  Height: 5' 4.5" (1.638 m)   There is no weight on file to calculate BMI.  Generalized: In no acute distress, pleasant AA female.  Neck: Supple, no carotid bruits  Cardiac: Regular rate  rhythm, no murmur  Pulmonary: Clear to auscultation bilaterally  Musculoskeletal: No  deformity   Neurological examination  Mentation: Alert oriented to time, place, history taking, mild word hesitancy/nonfleuncy  Cranial nerve II-XII: Pupils were equal round reactive to light extraocular movements were full, visual field were full on confrontational test. facial sensation and strength were normal. hearing was intact to finger rubbing bilaterally. Uvula tongue midline. head turning and shoulder shrug and were normal and symmetric.Tongue protrusion into cheek strength was normal.  MOTOR: normal bulk and tone, full strength in the BUE, BLE, fine finger movements normal, no pronator drift  SENSORY: normal and symmetric to light touch, pinprick, temperature, vibration and proprioception  COORDINATION: finger-nose-finger, heel-to-shin bilaterally, there was no truncal ataxia  REFLEXES: Brachioradialis 2/2, biceps 2/2, triceps 2/2, patellar 2/2, Achilles 2/2, plantar responses were flexor bilaterally.  GAIT/STATION: Rising up from seated position without assistance, normal stance, without trunk ataxia, moderate stride, good arm swing, smooth turning, able to perform tiptoe, and heel walking without difficulty.   Ct Head Wo Contrast 02/14/2013 No acute finding by CT. Chronic small vessel changes of the hemispheric deep white matter.  CTA of neck 11/11/12: Heavily calcified plaque in the left carotid bulb narrowing the lumen by 25% diameter stenosis. No significant right carotid stenosis. No significant vertebral artery stenosis.  CTA head 11/11/12: No significant intracranial stenosis.   ASSESSMENT AND PLAN 63 y.o. AA female with likely stroke on 11/11/12. Head CT normal, MRI unable due to AICD. CTA shows a 25% left ICA stenosis. No other significant abnormalities noted. Patient with multiple vascular risk factors. Stroke Risk Factors - Paroxsymal atrial fibrillation, diabetes mellitus, hypertension, stroke, CAD  s/p CABG. She has had no recurrent TIA or stroke symptoms.  PLAN: Continue aspirin 325 mg orally every day and clopidogrel 75 mg orally every day for secondary stroke prevention and maintain strict control of hypertension with blood pressure goal below 130/90, diabetes with hemoglobin A1c goal below 6.5% and lipids with LDL cholesterol goal below 100 mg/dL.  Check Carotid Dopplers. Followup in 6 months.  Orders Placed This Encounter  Procedures  . US Carotid Duplex Bilateral   Return in about 6 months (around 08/13/2014).  Philmore Pali, MSN, NP-C 02/11/2014, 1:22 PM [facility] [address] [place], [place] [address] [phone]  Note: This document was prepared with digital dictation and possible smart phrase technology. Any transcriptional errors that result from this process are unintentional.

## 2014-02-20 ENCOUNTER — Ambulatory Visit (INDEPENDENT_AMBULATORY_CARE_PROVIDER_SITE_OTHER): Payer: 59

## 2014-02-20 DIAGNOSIS — G459 Transient cerebral ischemic attack, unspecified: Secondary | ICD-10-CM

## 2014-02-20 DIAGNOSIS — I6529 Occlusion and stenosis of unspecified carotid artery: Secondary | ICD-10-CM

## 2014-02-25 DIAGNOSIS — M545 Low back pain, unspecified: Secondary | ICD-10-CM | POA: Diagnosis not present

## 2014-02-27 ENCOUNTER — Telehealth: Payer: Self-pay | Admitting: Nurse Practitioner

## 2014-02-27 DIAGNOSIS — N183 Chronic kidney disease, stage 3 unspecified: Secondary | ICD-10-CM | POA: Diagnosis not present

## 2014-02-27 DIAGNOSIS — I10 Essential (primary) hypertension: Secondary | ICD-10-CM | POA: Diagnosis not present

## 2014-02-27 DIAGNOSIS — Z23 Encounter for immunization: Secondary | ICD-10-CM | POA: Diagnosis not present

## 2014-02-27 DIAGNOSIS — I2510 Atherosclerotic heart disease of native coronary artery without angina pectoris: Secondary | ICD-10-CM | POA: Diagnosis not present

## 2014-02-27 DIAGNOSIS — E119 Type 2 diabetes mellitus without complications: Secondary | ICD-10-CM | POA: Diagnosis not present

## 2014-02-27 DIAGNOSIS — Z0000 Encounter for general adult medical examination without abnormal findings: Secondary | ICD-10-CM | POA: Diagnosis not present

## 2014-02-27 DIAGNOSIS — I509 Heart failure, unspecified: Secondary | ICD-10-CM | POA: Diagnosis not present

## 2014-02-27 DIAGNOSIS — E7800 Pure hypercholesterolemia, unspecified: Secondary | ICD-10-CM | POA: Diagnosis not present

## 2014-02-27 DIAGNOSIS — J45909 Unspecified asthma, uncomplicated: Secondary | ICD-10-CM | POA: Diagnosis not present

## 2014-02-27 NOTE — Telephone Encounter (Signed)
Called patient and reported carotid doppler results, no significant stenosis.  She acknowledged results and had no questions.

## 2014-03-14 ENCOUNTER — Ambulatory Visit: Payer: 59 | Admitting: Nurse Practitioner

## 2014-03-18 ENCOUNTER — Ambulatory Visit (INDEPENDENT_AMBULATORY_CARE_PROVIDER_SITE_OTHER): Payer: 59 | Admitting: *Deleted

## 2014-03-18 ENCOUNTER — Telehealth: Payer: Self-pay | Admitting: Cardiology

## 2014-03-18 DIAGNOSIS — I5022 Chronic systolic (congestive) heart failure: Secondary | ICD-10-CM

## 2014-03-18 DIAGNOSIS — I471 Supraventricular tachycardia, unspecified: Secondary | ICD-10-CM

## 2014-03-18 LAB — MDC_IDC_ENUM_SESS_TYPE_REMOTE
Battery Voltage: 2.66 V
Brady Statistic AP VP Percent: 1.14 %
Brady Statistic AP VS Percent: 0.01 %
Brady Statistic AS VP Percent: 98.83 %
Brady Statistic RA Percent Paced: 1.15 %
Brady Statistic RV Percent Paced: 99.97 %
Date Time Interrogation Session: 20150511223326
HighPow Impedance: 41 Ohm
HighPow Impedance: 53 Ohm
Lead Channel Impedance Value: 472 Ohm
Lead Channel Impedance Value: 536 Ohm
Lead Channel Sensing Intrinsic Amplitude: 3.0096
Lead Channel Setting Pacing Amplitude: 2 V
Lead Channel Setting Pacing Amplitude: 2.5 V
Lead Channel Setting Pacing Amplitude: 2.5 V
Lead Channel Setting Pacing Pulse Width: 0.4 ms
Lead Channel Setting Sensing Sensitivity: 0.3 mV
MDC IDC MSMT LEADCHNL LV PACING THRESHOLD AMPLITUDE: 1 V
MDC IDC MSMT LEADCHNL LV PACING THRESHOLD PULSEWIDTH: 0.4 ms
MDC IDC SET LEADCHNL RV PACING PULSEWIDTH: 0.4 ms
MDC IDC SET ZONE DETECTION INTERVAL: 350 ms
MDC IDC STAT BRADY AS VS PERCENT: 0.02 %
Zone Setting Detection Interval: 300 ms
Zone Setting Detection Interval: 400 ms
Zone Setting Detection Interval: 430 ms

## 2014-03-18 NOTE — Telephone Encounter (Signed)
Spoke with pt and reminded her of her remote transmission that is due today. She stated that she was currently out of town but will be home today and as soon as she got home she would upload transmission.

## 2014-03-19 NOTE — Progress Notes (Signed)
Remote ICD transmission.   

## 2014-04-05 ENCOUNTER — Encounter: Payer: Self-pay | Admitting: Cardiology

## 2014-04-08 ENCOUNTER — Ambulatory Visit (INDEPENDENT_AMBULATORY_CARE_PROVIDER_SITE_OTHER): Payer: 59 | Admitting: Interventional Cardiology

## 2014-04-08 ENCOUNTER — Encounter (INDEPENDENT_AMBULATORY_CARE_PROVIDER_SITE_OTHER): Payer: Self-pay

## 2014-04-08 VITALS — BP 136/97 | HR 82 | Ht 64.5 in | Wt 172.0 lb

## 2014-04-08 DIAGNOSIS — N318 Other neuromuscular dysfunction of bladder: Secondary | ICD-10-CM | POA: Diagnosis not present

## 2014-04-08 DIAGNOSIS — I5022 Chronic systolic (congestive) heart failure: Secondary | ICD-10-CM

## 2014-04-08 DIAGNOSIS — I059 Rheumatic mitral valve disease, unspecified: Secondary | ICD-10-CM | POA: Diagnosis not present

## 2014-04-08 DIAGNOSIS — I10 Essential (primary) hypertension: Secondary | ICD-10-CM

## 2014-04-08 DIAGNOSIS — I6350 Cerebral infarction due to unspecified occlusion or stenosis of unspecified cerebral artery: Secondary | ICD-10-CM

## 2014-04-08 DIAGNOSIS — N189 Chronic kidney disease, unspecified: Secondary | ICD-10-CM | POA: Diagnosis not present

## 2014-04-08 DIAGNOSIS — I341 Nonrheumatic mitral (valve) prolapse: Secondary | ICD-10-CM

## 2014-04-08 DIAGNOSIS — I2510 Atherosclerotic heart disease of native coronary artery without angina pectoris: Secondary | ICD-10-CM | POA: Diagnosis not present

## 2014-04-08 DIAGNOSIS — I639 Cerebral infarction, unspecified: Secondary | ICD-10-CM

## 2014-04-08 DIAGNOSIS — IMO0001 Reserved for inherently not codable concepts without codable children: Secondary | ICD-10-CM | POA: Diagnosis not present

## 2014-04-08 NOTE — Progress Notes (Signed)
Patient ID: [name], female   DOB: 11-24-1950, 63 y.o.   MRN: [id number]    [address], [place]  [address] Phone: [phone] Fax:  [phone]  Date:  04/08/2014   ID:  [name], DOB [birth year], MRN [id number]  PCP:  Reginia Naas, MD   ASSESSMENT:  1. Chronic systolic heart failure, clinically stable 2. Coronary artery disease stable without significant recurrences of angina 3. AICD, no discharges 4. History of CVA with no recurrent transient complaints. 5. Mitral prolapse, clinically stable   PLAN:  1. Low-salt diet. Continue vegetarian lifestyle 2. No change in medical regimen 3. Call if heart failure or anginal complaints   SUBJECTIVE: [name] is a 63 y.o. female who has a history of coronary artery disease, ischemic cardiomyopathy, AICD, chronic kidney disease, who is doing well at this time. She said minimal if any cardiovascular symptoms. She has visited by within the past 6 months and had no problems. She denies dyspnea, orthopnea, PND, and transient neurological symptoms. No medication side effects.   Wt Readings from Last 3 Encounters:  04/08/14 172 lb (78.019 kg)  10/08/13 164 lb (74.39 kg)  09/11/13 165 lb (74.844 kg)     Past Medical History  Diagnosis Date  . Ischemic cardiomyopathy     status post biventricular ICD placed by DR Edumunds who used to see Dr Melvern Banker here to establish  cardiovascular care.  . Hypertension   . Coronary artery disease   . CHF (congestive heart failure)   . MVP (mitral valve prolapse)     Antibiotics not required for procedures  . Asthma   . Anxiety   . Depression   . Hiatal hernia   . Cervical dysplasia   . Fibroid   . Function kidney decreased   . History of shingles   . Chronic kidney disease   . ICD (implantable cardiac defibrillator) in place   . Pacemaker   . Complication of anesthesia     "I wake up during surgeries" (02/14/2013)  . Anginal pain     . Myocardial infarction     "I've had 2; the others they were able to catch before completing" (02/14/2013)  . Pneumonia 1950's & 1985  . Shortness of breath     "lying down flat; at times w/exertion" (02/14/2013)  . Type II diabetes mellitus   . Iron deficiency anemia   . GERD (gastroesophageal reflux disease)   . History of stomach ulcers   . Migraines   . Stroke     "2 confirmed; 9 TIA's; results in dragging LLE; numbness in tip of tongue" (02/14/2013)    Current Outpatient Prescriptions  Medication Sig Dispense Refill  . albuterol (PROVENTIL HFA;VENTOLIN HFA) 108 (90 BASE) MCG/ACT inhaler Inhale 2 puffs into the lungs every 4 (four) hours as needed for wheezing or shortness of breath.      . ALPRAZolam (XANAX) 1 MG tablet Take 0.5-1 mg by mouth 3 (three) times daily as needed (1 tab in the AM, 0.5 tab at noon and 0.5 tab in the PM).       . Artificial Tear GEL Apply to eye. 2 drops as needed       . aspirin 325 MG EC tablet Take 325 mg by mouth daily.        . cetirizine (ZYRTEC) 10 MG tablet Take 10 mg by mouth daily.      [name] CINNAMON PO Take 1,000 mg by mouth daily.      [name]  clopidogrel (PLAVIX) 75 MG tablet Take 1 tablet by mouth  daily  90 tablet  2  . ezetimibe (ZETIA) 10 MG tablet Take 1 tablet (10 mg total) by mouth daily.  90 tablet  3  . fluticasone (FLONASE) 50 MCG/ACT nasal spray Place 2 sprays into the nose as needed.       . Fluticasone-Salmeterol (ADVAIR) 100-50 MCG/DOSE AEPB Inhale 1 puff into the lungs as needed.      . furosemide (LASIX) 80 MG tablet Take 1 tablet by mouth two  times daily  180 tablet  1  . isosorbide mononitrate (IMDUR) 30 MG 24 hr tablet Take 1 tablet (30 mg total) by mouth daily.  90 tablet  2  . metoprolol (LOPRESSOR) 100 MG tablet Take 1 tablet (100 mg total) by mouth 2 (two) times daily. 1 tab twice a day  180 tablet  2  . Multiple Vitamin (MULTIVITAMIN WITH MINERALS) TABS Take 1 tablet by mouth daily.      . nitroGLYCERIN (NITROSTAT) 0.4 MG SL  tablet Place 1 tablet (0.4 mg total) under the tongue every 5 (five) minutes as needed. For chest pain  25 tablet  3  . omeprazole (PRILOSEC) 40 MG capsule Take 40 mg by mouth daily.      . ondansetron (ZOFRAN) 8 MG tablet Take 8 mg by mouth every 8 (eight) hours as needed. For nausea      . potassium chloride (K-DUR) 10 MEQ tablet Take 2 tablets (20 mEq total) by mouth 2 (two) times daily.  360 tablet  1  . ramipril (ALTACE) 10 MG capsule Take 1 capsule by mouth  twice daily  180 capsule  3  . simvastatin (ZOCOR) 20 MG tablet Take 1 tablet (20 mg total) by mouth at bedtime. 1 tab daily  90 tablet  2  . sodium chloride (MURO 128) 5 % ophthalmic ointment Place 1 drop into both eyes daily as needed. For dry eyes      . traMADol (ULTRAM) 50 MG tablet Take 100 mg by mouth every 6 (six) hours as needed. For pain      . venlafaxine (EFFEXOR) 100 MG tablet 2 tabs am 1 tab in pm       No current facility-administered medications for this visit.    Allergies:    Allergies  Allergen Reactions  . Codeine     Unknown  . Digoxin And Related     Unknown  . Diltiazem     Unknown  . Latex     Latex tape  . Metolazone     Unknown  . Morphine Sulfate     Unknown  . Myrbetriq [Mirabegron]     Causes headaches  . Onglyza [Saxagliptin]     Causes migraines  . Penicillins     Unknown  . Septra Ds [Sulfamethoxazole W/Trimethoprim (Co-Trimoxazole)]     Unknown  . Spironolactone     Unknown  . Sulfa Drugs Cross Reactors     Unknown  . Rema Fendt [Diltiazem Hcl]     Unknown  . Tetracyclines & Related     Unknown  . Ticlid [Ticlopidine Hcl]     Unknown  . Verapamil     Unknown  . Vicodin [Hydrocodone-Acetaminophen]     Unknown    Social History:  The patient  reports that she has never smoked. She has never used smokeless tobacco. She reports that she drinks alcohol. She reports that she does not use illicit drugs.   ROS:  Please see the history of present illness.   Recent left  subclavicular tingling that she felt may have been related to her device. A recent dye load was unremarkable.   All other systems reviewed and negative.   OBJECTIVE: VS:  BP 136/97  Pulse 82  Ht 5' 4.5" (1.638 m)  Wt 172 lb (78.019 kg)  BMI 29.08 kg/m2 Well nourished, well developed, in no acute distress, appearing healthy and in no distress HEENT: normal Neck: JVD flat. Carotid bruit absent  Cardiac:  normal S1, S2; RRR; no murmur Lungs:  clear to auscultation bilaterally, no wheezing, rhonchi or rales Abd: soft, nontender, no hepatomegaly Ext: Edema absent. Pulses 2+ Skin: warm and dry Neuro:  CNs 2-12 intact, no focal abnormalities noted  EKG:  Not performed       Signed, Illene Labrador III, MD 04/08/2014 2:30 PM

## 2014-04-08 NOTE — Patient Instructions (Signed)
Your physician recommends that you continue on your current medications as directed. Please refer to the Current Medication list given to you today.  Your physician wants you to follow-up in: 6-8 months  You will receive a reminder letter in the mail two months in advance. If you don't receive a letter, please call our office to schedule the follow-up appointment.  

## 2014-04-09 ENCOUNTER — Encounter: Payer: Self-pay | Admitting: Interventional Cardiology

## 2014-04-12 ENCOUNTER — Telehealth: Payer: Self-pay | Admitting: Internal Medicine

## 2014-04-12 NOTE — Telephone Encounter (Signed)
Pt's remote will automatically transmit. Also clarified to pt that she will not have to take her equipment for short-term trips or international trips.

## 2014-04-12 NOTE — Telephone Encounter (Signed)
New message      Pt got a letter that we got her remote transmission on 03-18-14.  She did not transmit until 2-3 weeks later.  Is this the same transmission?  Please call

## 2014-04-16 ENCOUNTER — Encounter: Payer: Self-pay | Admitting: Internal Medicine

## 2014-05-14 ENCOUNTER — Ambulatory Visit (INDEPENDENT_AMBULATORY_CARE_PROVIDER_SITE_OTHER): Payer: 59 | Admitting: Gynecology

## 2014-05-14 ENCOUNTER — Encounter: Payer: Self-pay | Admitting: Gynecology

## 2014-05-14 ENCOUNTER — Telehealth: Payer: Self-pay | Admitting: *Deleted

## 2014-05-14 VITALS — BP 136/88

## 2014-05-14 DIAGNOSIS — N6320 Unspecified lump in the left breast, unspecified quadrant: Secondary | ICD-10-CM | POA: Insufficient documentation

## 2014-05-14 DIAGNOSIS — N63 Unspecified lump in unspecified breast: Secondary | ICD-10-CM | POA: Diagnosis not present

## 2014-05-14 DIAGNOSIS — N644 Mastodynia: Secondary | ICD-10-CM | POA: Insufficient documentation

## 2014-05-14 DIAGNOSIS — M940 Chondrocostal junction syndrome [Tietze]: Secondary | ICD-10-CM | POA: Diagnosis not present

## 2014-05-14 DIAGNOSIS — I2510 Atherosclerotic heart disease of native coronary artery without angina pectoris: Secondary | ICD-10-CM

## 2014-05-14 NOTE — Telephone Encounter (Signed)
Message copied by Thamas Jaegers on Tue May 14, 2014 10:22 AM ------      Message from: Terrance Mass      Created: Tue May 14, 2014  9:39 AM       Anderson [place], please schedule a diagnostic mammogram of the left breast and a 3-dimensional mammogram of the right breast for this patient with the following findings:            Findings: Left breast 4:00 position 2 fingerbreadths from the nipple was a half centimeter tender nodule. A second nodule was noted at the 10:00 position 4 fingerbreadths from the nipple half centimeter in size to 1 cm in size tender and irregular.            Right breast tenderness was noted from the one to the 3:00 position approximately 4 fingerbreadths from the nipple but no masses were palpated. There was no supraclavicular or axillary lymphadenopathy on either breast.            She had her [facility] mammogram [facility] center. ------

## 2014-05-14 NOTE — Telephone Encounter (Signed)
ORDERS PLACED AT BREAST CENTER THEY WILL CONTACT PATIENT TO SCHEDULE.

## 2014-05-14 NOTE — Progress Notes (Signed)
   Patient is a 63 year old who has not been seen in the office since 2013. Patient was previously been followed by my partner her retired Dr. Cherylann Banas. Patient's reason for visit today is that for the past few day she has been experiencing some right medial breast tenderness. She stated that several years ago she has had left breast biopsies which have been benign. Her last mammogram in August of 2014 was described as normal but dense. A left breast fibroadenoma was described and a pathology report in June of 2008, also in that same year left breast core biopsy had demonstrated sclerosing papilloma with atypical ductal hyperplasia, apocrine metaplasia and microcalcifications.  Exam today: Both breasts were examined sitting supine position both breasts were symmetrical in appearance no skin dictation or discoloration or nipple inversion.   Findings: Left breast 4:00 position 2 fingerbreadths from the nipple was a half centimeter tender nodule. A second nodule was noted at the 10:00 position 4 fingerbreadths from the nipple half centimeter in size to 1 cm in size tender and irregular.  Right breast tenderness was noted from the one to the 3:00 position approximately 4 fingerbreadths from the nipple but no masses were palpated. There was no supraclavicular or axillary lymphadenopathy on either breast.  Assessment/plan: Patient will be sent for diagnostic mammogram of the left breast resident for finding of 2 breast masses that were noted above. Also a screening three-dimensional mammogram of the right breast especially focusing on the tender area from the 1 to 3:00 position patient has described tenderness. Patient is due for her annual exam and she will make an appointment next 2 weeks. I have asked her to bring a copy of her bone density study from her PCP office so that we can review and scant and placed in our electronic records.  It appears her tenderness is near the sternum possibly costochondritis  that she can take Tylenol when necessary.

## 2014-05-16 NOTE — Telephone Encounter (Signed)
Appointment 05/22/14 @ 10:00 am

## 2014-05-20 DIAGNOSIS — IMO0001 Reserved for inherently not codable concepts without codable children: Secondary | ICD-10-CM | POA: Diagnosis not present

## 2014-05-22 ENCOUNTER — Ambulatory Visit
Admission: RE | Admit: 2014-05-22 | Discharge: 2014-05-22 | Disposition: A | Discharge status: Home / Self Care | Payer: 59 | Source: Ambulatory Visit | Attending: Gynecology | Admitting: Gynecology

## 2014-05-22 ENCOUNTER — Other Ambulatory Visit: Payer: Self-pay | Admitting: Interventional Cardiology

## 2014-05-22 ENCOUNTER — Encounter (INDEPENDENT_AMBULATORY_CARE_PROVIDER_SITE_OTHER): Payer: Self-pay

## 2014-05-22 ENCOUNTER — Other Ambulatory Visit: Payer: Self-pay | Admitting: Gynecology

## 2014-05-22 DIAGNOSIS — N63 Unspecified lump in unspecified breast: Secondary | ICD-10-CM

## 2014-05-22 DIAGNOSIS — N644 Mastodynia: Secondary | ICD-10-CM

## 2014-05-22 DIAGNOSIS — N6459 Other signs and symptoms in breast: Secondary | ICD-10-CM | POA: Diagnosis not present

## 2014-05-28 DIAGNOSIS — N318 Other neuromuscular dysfunction of bladder: Secondary | ICD-10-CM | POA: Diagnosis not present

## 2014-05-28 DIAGNOSIS — N3941 Urge incontinence: Secondary | ICD-10-CM | POA: Diagnosis not present

## 2014-05-28 DIAGNOSIS — R3915 Urgency of urination: Secondary | ICD-10-CM | POA: Diagnosis not present

## 2014-06-03 ENCOUNTER — Ambulatory Visit (INDEPENDENT_AMBULATORY_CARE_PROVIDER_SITE_OTHER): Payer: 59 | Admitting: Gynecology

## 2014-06-03 ENCOUNTER — Other Ambulatory Visit: Payer: Self-pay | Admitting: Cardiology

## 2014-06-03 ENCOUNTER — Encounter: Payer: Self-pay | Admitting: Gynecology

## 2014-06-03 VITALS — BP 136/88 | Ht 64.5 in | Wt 161.0 lb

## 2014-06-03 DIAGNOSIS — Z01419 Encounter for gynecological examination (general) (routine) without abnormal findings: Secondary | ICD-10-CM

## 2014-06-03 DIAGNOSIS — N952 Postmenopausal atrophic vaginitis: Secondary | ICD-10-CM

## 2014-06-03 MED ORDER — ESTRADIOL 10 MCG VA TABS: 1.0000 | ORAL_TABLET | VAGINAL | Status: DC

## 2014-06-03 NOTE — Patient Instructions (Addendum)
Estradiol vaginal tablets What is this medicine? ESTRADIOL (es tra DYE ole) vaginal tablet is used to help relieve symptoms of vaginal irritation and dryness that occurs in some women during menopause. This medicine may be used for other purposes; ask your health care provider or pharmacist if you have questions. COMMON BRAND NAME(S): Vagifem What should I tell my health care provider before I take this medicine? They need to know if you have any of these conditions: -abnormal vaginal bleeding -blood vessel disease or blood clots -breast, cervical, endometrial, ovarian, liver, or uterine cancer -dementia -diabetes -gallbladder disease -heart disease or recent heart attack -high blood pressure -high cholesterol -high level of calcium in the blood -hysterectomy -kidney disease -liver disease -migraine headaches -protein C deficiency -protein S deficiency -stroke -systemic lupus erythematosus (SLE) -tobacco smoker -an unusual or allergic reaction to estrogens, other hormones, medicines, foods, dyes, or preservatives -pregnant or trying to get pregnant -breast-feeding How should I use this medicine? This medicine is only for use in the vagina. Do not take by mouth. Wash your hands before and after use. Read package directions carefully. Unwrap the pre-filled applicator package. Lie on your back, part and bend your knees. Gently insert the applicator tip high in the vagina and push the plunger to release the tablet into the vagina. Gently remove the applicator. Throw away the applicator after use. Do not use your medicine more often than directed. Finish the full course prescribed by your doctor or health care professional even if you think your condition is better. Do not stop using except on the advice of your doctor or health care professional. Talk to your pediatrician regarding the use of this medicine in children. A patient package insert for the product will be given with each  prescription and refill. Read this sheet carefully each time. The sheet may change frequently. Overdosage: If you think you have taken too much of this medicine contact a poison control center or emergency room at once. NOTE: This medicine is only for you. Do not share this medicine with others. What if I miss a dose? If you miss a dose, take it as soon as you can. If it is almost time for your next dose, take only that dose. Do not take double or extra doses. What may interact with this medicine? Do not take this medicine with any of the following medications: -aromatase inhibitors like aminoglutethimide, anastrozole, exemestane, letrozole, testolactone This medicine may also interact with the following medications: -antibiotics used to treat tuberculosis like rifabutin, rifampin and rifapentene -raloxifene or tamoxifen -warfarin This list may not describe all possible interactions. Give your health care provider a list of all the medicines, herbs, non-prescription drugs, or dietary supplements you use. Also tell them if you smoke, drink alcohol, or use illegal drugs. Some items may interact with your medicine. What should I watch for while using this medicine? Visit your health care professional for regular checks on your progress. You will need a regular breast and pelvic exam. You should also discuss the need for regular mammograms with your health care professional, and follow his or her guidelines. This medicine can make your body retain fluid, making your fingers, hands, or ankles swell. Your blood pressure can go up. Contact your doctor or health care professional if you feel you are retaining fluid. If you have any reason to think you are pregnant; stop taking this medicine at once and contact your doctor or health care professional. Tobacco smoking increases the risk of getting  a blood clot or having a stroke, especially if you are more than 61 years old. You are strongly advised not to  smoke. If you wear contact lenses and notice visual changes, or if the lenses begin to feel uncomfortable, consult your eye care specialist. If you are going to have elective surgery, you may need to stop taking this medicine beforehand. Consult your health care professional for advice prior to scheduling the surgery. What side effects may I notice from receiving this medicine? Side effects that you should report to your doctor or health care professional as soon as possible: -allergic reactions like skin rash, itching or hives, swelling of the face, lips, or tongue -breast tissue changes or discharge -changes in vision -chest pain -confusion, trouble speaking or understanding -dark urine -general ill feeling or flu-like symptoms -light-colored stools -nausea, vomiting -pain, swelling, warmth in the leg -right upper belly pain -severe headaches -shortness of breath -sudden numbness or weakness of the face, arm or leg -trouble walking, dizziness, loss of balance or coordination -unusual vaginal bleeding -yellowing of the eyes or skin Side effects that usually do not require medical attention (report to your doctor or health care professional if they continue or are bothersome): -hair loss -increased hunger or thirst -increased urination -symptoms of vaginal infection like itching, irritation or unusual discharge -unusually weak or tired This list may not describe all possible side effects. Call your doctor for medical advice about side effects. You may report side effects to FDA at 1-800-FDA-1088. Where should I keep my medicine? Keep out of the reach of children. Store at room temperature between 15 and 30 degrees C (59 and 86 degrees F). Throw away any unused medicine after the expiration date. NOTE: This sheet is a summary. It may not cover all possible information. If you have questions about this medicine, talk to your doctor, pharmacist, or health care provider.  2015,  Elsevier/Gold Standard. (2011-01-27 09:08:58)

## 2014-06-04 ENCOUNTER — Encounter: Payer: Self-pay | Admitting: Women's Health

## 2014-06-04 NOTE — Progress Notes (Signed)
[name] [birth year] [id number]   History:    63 y.o.  for annual gyn exam who has not been seen in the office since 2013. Patient with past history of total abdominal hysterectomy in 1984 for endometriosis. Prior to that she reports normal Pap smears in the past. She has to suffer from vaginal dryness and irritation that has been scared of using any estrogen products. She has informed me that she has been taking orally black cohosh. Patient has past history of triple bypass. She also had a left breast biopsy in the past which was benign. She's had cardiac catheterization in 2004 in 2005 respectively. She also had a breast biopsy in 2008 as well. Her last mammogram was reported to be normal 3 dimension as a result of rest being dense in July of 2015. Her PCP is Dr. [name] has been doing her blood work. Her colonoscopy was 4 years ago reported to be normal. Also at another facility she reports having had a normal bone density study in 2009 and 2015.   Past medical history,surgical history, family history and social history were all reviewed and documented in the EPIC chart.  Gynecologic History No LMP recorded. Patient has had a hysterectomy. Contraception: status post hysterectomy Last Pap: 2013. Results were: normal Last mammogram: 2015. Results were: There's been normal  Obstetric History OB History  Gravida Para Term Preterm AB SAB TAB Ectopic Multiple Living  7 2  2 5     1     # Outcome Date GA Lbr Len/2nd Weight Sex Delivery Anes PTL Lv  7 ABT           6 ABT           5 ABT           4 ABT           3 ABT           2 PRE           1 PRE                ROS: A ROS was performed and pertinent positives and negatives are included in the history.  GENERAL: No fevers or chills. HEENT: No change in vision, no earache, sore throat or sinus congestion. NECK: No pain or stiffness. CARDIOVASCULAR: No chest pain or pressure. No palpitations. PULMONARY: No shortness of  breath, cough or wheeze. GASTROINTESTINAL: No abdominal pain, nausea, vomiting or diarrhea, melena or bright red blood per rectum. GENITOURINARY: No urinary frequency, urgency, hesitancy or dysuria. MUSCULOSKELETAL: No joint or muscle pain, no back pain, no recent trauma. DERMATOLOGIC: No rash, no itching, no lesions. ENDOCRINE: No polyuria, polydipsia, no heat or cold intolerance. No recent change in weight. HEMATOLOGICAL: No anemia or easy bruising or bleeding. NEUROLOGIC: No headache, seizures, numbness, tingling or weakness. PSYCHIATRIC: No depression, no loss of interest in normal activity or change in sleep pattern.     Exam: chaperone present  BP 136/88  Ht 5' 4.5" (1.638 m)  Wt 161 lb (73.029 kg)  BMI 27.22 kg/m2  Body mass index is 27.22 kg/(m^2).  General appearance : Well developed well nourished female. No acute distress HEENT: Neck supple, trachea midline, no carotid bruits, no thyroidmegaly Lungs: Clear to auscultation, no rhonchi or wheezes, or rib retractions  Heart: Regular rate and rhythm, no murmurs or gallops Breast:Examined in sitting and supine position were symmetrical in appearance, no palpable masses or tenderness,  no skin retraction,  no nipple inversion, no nipple discharge, no skin discoloration, no axillary or supraclavicular lymphadenopathy Abdomen: no palpable masses or tenderness, no rebound or guarding Extremities: no edema or skin discoloration or tenderness  Pelvic:  Bartholin, Urethra, Skene Glands:  vaginal atrophy             Vagina: No gross lesions or discharge  Cervix:Absent   Uterus  absent   Adnexa  Without masses or tenderness  Anus and perineum  normal   Rectovaginal  normal sphincter tone without palpated masses or tenderness             Hemoccult PCP provides     Assessment/Plan:  63 y.o. female for annual exam postmenopausal blood vaginal atrophy. Patient was taking black cohosh and I have explained to her that that contains a  phytoestrogen which she will discontinue. She is interested in proceeding with a vaginal estrogen such as Vagifem 10 mcg twice weekly intravaginally. The risks benefits and pros and cons were discussed. She was instructed to continue doing monthly breast exam. We discussed the importance of calcium vitamin D and regular exercise for osteoporosis prevention. Pap smear no longer indicated according to the new guidelines. Her PCP will be doing her blood work.  Note: This dictation was prepared with  Dragon/digital dictation along withSmart phrase technology. Any transcriptional errors that result from this process are unintentional.   Terrance Mass MD, 8:31 AM 06/04/2014

## 2014-06-06 ENCOUNTER — Other Ambulatory Visit: Payer: Self-pay

## 2014-06-06 MED ORDER — FUROSEMIDE 80 MG PO TABS: ORAL_TABLET | ORAL | Status: DC

## 2014-06-14 DIAGNOSIS — N3941 Urge incontinence: Secondary | ICD-10-CM | POA: Diagnosis not present

## 2014-06-14 DIAGNOSIS — R3915 Urgency of urination: Secondary | ICD-10-CM | POA: Diagnosis not present

## 2014-06-20 ENCOUNTER — Ambulatory Visit (INDEPENDENT_AMBULATORY_CARE_PROVIDER_SITE_OTHER): Payer: 59 | Admitting: *Deleted

## 2014-06-20 DIAGNOSIS — I5022 Chronic systolic (congestive) heart failure: Secondary | ICD-10-CM

## 2014-06-20 DIAGNOSIS — I428 Other cardiomyopathies: Secondary | ICD-10-CM

## 2014-06-20 LAB — MDC_IDC_ENUM_SESS_TYPE_REMOTE
Battery Voltage: 2.64 V
Brady Statistic AS VP Percent: 99.08 %
Brady Statistic RA Percent Paced: 0.91 %
Date Time Interrogation Session: 20150813140824
HIGH POWER IMPEDANCE MEASURED VALUE: 40 Ohm
HIGH POWER IMPEDANCE MEASURED VALUE: 52 Ohm
Lead Channel Impedance Value: 536 Ohm
Lead Channel Pacing Threshold Amplitude: 1 V
Lead Channel Sensing Intrinsic Amplitude: 2.508 mV
Lead Channel Setting Pacing Amplitude: 2 V
Lead Channel Setting Pacing Amplitude: 2.5 V
Lead Channel Setting Pacing Pulse Width: 0.4 ms
MDC IDC MSMT LEADCHNL LV PACING THRESHOLD PULSEWIDTH: 0.4 ms
MDC IDC MSMT LEADCHNL RV IMPEDANCE VALUE: 488 Ohm
MDC IDC SET LEADCHNL RV PACING AMPLITUDE: 2.5 V
MDC IDC SET LEADCHNL RV PACING PULSEWIDTH: 0.4 ms
MDC IDC SET LEADCHNL RV SENSING SENSITIVITY: 0.3 mV
MDC IDC SET ZONE DETECTION INTERVAL: 400 ms
MDC IDC STAT BRADY AP VP PERCENT: 0.9 %
MDC IDC STAT BRADY AP VS PERCENT: 0.01 %
MDC IDC STAT BRADY AS VS PERCENT: 0.01 %
MDC IDC STAT BRADY RV PERCENT PACED: 99.98 %
Zone Setting Detection Interval: 300 ms
Zone Setting Detection Interval: 350 ms
Zone Setting Detection Interval: 430 ms

## 2014-06-20 NOTE — Progress Notes (Signed)
Remote ICD transmission.   

## 2014-06-21 DIAGNOSIS — R3915 Urgency of urination: Secondary | ICD-10-CM | POA: Diagnosis not present

## 2014-06-21 DIAGNOSIS — N318 Other neuromuscular dysfunction of bladder: Secondary | ICD-10-CM | POA: Diagnosis not present

## 2014-06-21 DIAGNOSIS — N3941 Urge incontinence: Secondary | ICD-10-CM | POA: Diagnosis not present

## 2014-06-25 DIAGNOSIS — F4323 Adjustment disorder with mixed anxiety and depressed mood: Secondary | ICD-10-CM | POA: Diagnosis not present

## 2014-06-26 ENCOUNTER — Encounter: Payer: Self-pay | Admitting: Cardiology

## 2014-07-08 DIAGNOSIS — D631 Anemia in chronic kidney disease: Secondary | ICD-10-CM | POA: Diagnosis not present

## 2014-07-08 DIAGNOSIS — I129 Hypertensive chronic kidney disease with stage 1 through stage 4 chronic kidney disease, or unspecified chronic kidney disease: Secondary | ICD-10-CM | POA: Diagnosis not present

## 2014-07-08 DIAGNOSIS — N183 Chronic kidney disease, stage 3 unspecified: Secondary | ICD-10-CM | POA: Diagnosis not present

## 2014-07-08 DIAGNOSIS — N2581 Secondary hyperparathyroidism of renal origin: Secondary | ICD-10-CM | POA: Diagnosis not present

## 2014-07-11 ENCOUNTER — Encounter: Payer: Self-pay | Admitting: Internal Medicine

## 2014-07-17 DIAGNOSIS — IMO0002 Reserved for concepts with insufficient information to code with codable children: Secondary | ICD-10-CM | POA: Diagnosis not present

## 2014-07-17 DIAGNOSIS — G43719 Chronic migraine without aura, intractable, without status migrainosus: Secondary | ICD-10-CM | POA: Diagnosis not present

## 2014-07-17 DIAGNOSIS — G609 Hereditary and idiopathic neuropathy, unspecified: Secondary | ICD-10-CM | POA: Diagnosis not present

## 2014-07-17 DIAGNOSIS — G5610 Other lesions of median nerve, unspecified upper limb: Secondary | ICD-10-CM | POA: Diagnosis not present

## 2014-07-17 DIAGNOSIS — M5412 Radiculopathy, cervical region: Secondary | ICD-10-CM | POA: Diagnosis not present

## 2014-08-01 DIAGNOSIS — R634 Abnormal weight loss: Secondary | ICD-10-CM | POA: Diagnosis not present

## 2014-08-01 DIAGNOSIS — R209 Unspecified disturbances of skin sensation: Secondary | ICD-10-CM | POA: Diagnosis not present

## 2014-08-01 DIAGNOSIS — G609 Hereditary and idiopathic neuropathy, unspecified: Secondary | ICD-10-CM | POA: Diagnosis not present

## 2014-08-07 ENCOUNTER — Other Ambulatory Visit: Payer: Self-pay | Admitting: Interventional Cardiology

## 2014-08-08 DIAGNOSIS — M5412 Radiculopathy, cervical region: Secondary | ICD-10-CM | POA: Diagnosis not present

## 2014-08-08 DIAGNOSIS — G43719 Chronic migraine without aura, intractable, without status migrainosus: Secondary | ICD-10-CM | POA: Diagnosis not present

## 2014-08-08 DIAGNOSIS — M5417 Radiculopathy, lumbosacral region: Secondary | ICD-10-CM | POA: Diagnosis not present

## 2014-08-08 DIAGNOSIS — G603 Idiopathic progressive neuropathy: Secondary | ICD-10-CM | POA: Diagnosis not present

## 2014-08-08 DIAGNOSIS — G5602 Carpal tunnel syndrome, left upper limb: Secondary | ICD-10-CM | POA: Diagnosis not present

## 2014-08-09 ENCOUNTER — Other Ambulatory Visit: Payer: Self-pay | Admitting: *Deleted

## 2014-08-09 ENCOUNTER — Ambulatory Visit (INDEPENDENT_AMBULATORY_CARE_PROVIDER_SITE_OTHER): Payer: 59 | Admitting: Radiology

## 2014-08-09 DIAGNOSIS — Z23 Encounter for immunization: Secondary | ICD-10-CM

## 2014-08-09 MED ORDER — FUROSEMIDE 80 MG PO TABS: ORAL_TABLET | ORAL | Status: DC

## 2014-08-12 DIAGNOSIS — E119 Type 2 diabetes mellitus without complications: Secondary | ICD-10-CM | POA: Diagnosis not present

## 2014-08-12 DIAGNOSIS — H2512 Age-related nuclear cataract, left eye: Secondary | ICD-10-CM | POA: Diagnosis not present

## 2014-08-12 DIAGNOSIS — H04123 Dry eye syndrome of bilateral lacrimal glands: Secondary | ICD-10-CM | POA: Diagnosis not present

## 2014-08-12 DIAGNOSIS — H25811 Combined forms of age-related cataract, right eye: Secondary | ICD-10-CM | POA: Diagnosis not present

## 2014-08-14 ENCOUNTER — Ambulatory Visit: Payer: 59 | Admitting: Neurology

## 2014-08-20 DIAGNOSIS — E1165 Type 2 diabetes mellitus with hyperglycemia: Secondary | ICD-10-CM | POA: Diagnosis not present

## 2014-08-20 DIAGNOSIS — I10 Essential (primary) hypertension: Secondary | ICD-10-CM | POA: Diagnosis not present

## 2014-08-20 DIAGNOSIS — N183 Chronic kidney disease, stage 3 (moderate): Secondary | ICD-10-CM | POA: Diagnosis not present

## 2014-08-20 DIAGNOSIS — E1151 Type 2 diabetes mellitus with diabetic peripheral angiopathy without gangrene: Secondary | ICD-10-CM | POA: Diagnosis not present

## 2014-08-20 DIAGNOSIS — E1121 Type 2 diabetes mellitus with diabetic nephropathy: Secondary | ICD-10-CM | POA: Diagnosis not present

## 2014-08-23 ENCOUNTER — Other Ambulatory Visit: Payer: Self-pay | Admitting: Interventional Cardiology

## 2014-09-09 ENCOUNTER — Encounter: Payer: Self-pay | Admitting: Gynecology

## 2014-09-18 DIAGNOSIS — M5417 Radiculopathy, lumbosacral region: Secondary | ICD-10-CM | POA: Diagnosis not present

## 2014-09-18 DIAGNOSIS — G5602 Carpal tunnel syndrome, left upper limb: Secondary | ICD-10-CM | POA: Diagnosis not present

## 2014-09-18 DIAGNOSIS — G43719 Chronic migraine without aura, intractable, without status migrainosus: Secondary | ICD-10-CM | POA: Diagnosis not present

## 2014-09-18 DIAGNOSIS — G603 Idiopathic progressive neuropathy: Secondary | ICD-10-CM | POA: Diagnosis not present

## 2014-09-18 DIAGNOSIS — M5412 Radiculopathy, cervical region: Secondary | ICD-10-CM | POA: Diagnosis not present

## 2014-10-02 ENCOUNTER — Encounter: Payer: Self-pay | Admitting: *Deleted

## 2014-10-08 ENCOUNTER — Ambulatory Visit: Payer: 59 | Admitting: Interventional Cardiology

## 2014-10-09 ENCOUNTER — Other Ambulatory Visit: Payer: Self-pay | Admitting: Interventional Cardiology

## 2014-10-14 ENCOUNTER — Other Ambulatory Visit: Payer: Self-pay

## 2014-10-14 MED ORDER — CLOPIDOGREL BISULFATE 75 MG PO TABS: ORAL_TABLET | ORAL | Status: DC

## 2014-10-15 ENCOUNTER — Ambulatory Visit (INDEPENDENT_AMBULATORY_CARE_PROVIDER_SITE_OTHER): Payer: Medicare Other | Admitting: Internal Medicine

## 2014-10-15 ENCOUNTER — Encounter: Payer: Self-pay | Admitting: Internal Medicine

## 2014-10-15 VITALS — BP 142/74 | HR 88 | Ht 64.5 in | Wt 181.4 lb

## 2014-10-15 DIAGNOSIS — I5022 Chronic systolic (congestive) heart failure: Secondary | ICD-10-CM

## 2014-10-15 DIAGNOSIS — I10 Essential (primary) hypertension: Secondary | ICD-10-CM

## 2014-10-15 DIAGNOSIS — M549 Dorsalgia, unspecified: Secondary | ICD-10-CM | POA: Diagnosis not present

## 2014-10-15 DIAGNOSIS — Z95810 Presence of automatic (implantable) cardiac defibrillator: Secondary | ICD-10-CM

## 2014-10-15 DIAGNOSIS — M5412 Radiculopathy, cervical region: Secondary | ICD-10-CM | POA: Diagnosis not present

## 2014-10-15 DIAGNOSIS — M5417 Radiculopathy, lumbosacral region: Secondary | ICD-10-CM | POA: Diagnosis not present

## 2014-10-15 DIAGNOSIS — G603 Idiopathic progressive neuropathy: Secondary | ICD-10-CM | POA: Diagnosis not present

## 2014-10-15 DIAGNOSIS — I2510 Atherosclerotic heart disease of native coronary artery without angina pectoris: Secondary | ICD-10-CM | POA: Diagnosis not present

## 2014-10-15 DIAGNOSIS — I471 Supraventricular tachycardia: Secondary | ICD-10-CM | POA: Diagnosis not present

## 2014-10-15 DIAGNOSIS — G43719 Chronic migraine without aura, intractable, without status migrainosus: Secondary | ICD-10-CM | POA: Diagnosis not present

## 2014-10-15 LAB — MDC_IDC_ENUM_SESS_TYPE_INCLINIC
Battery Voltage: 2.62 V
Brady Statistic RA Percent Paced: 0.84 %
Brady Statistic RV Percent Paced: 99.98 %
Date Time Interrogation Session: 20151208161650
HIGH POWER IMPEDANCE MEASURED VALUE: 39 Ohm
HIGH POWER IMPEDANCE MEASURED VALUE: 50 Ohm
Lead Channel Impedance Value: 488 Ohm
Lead Channel Impedance Value: 520 Ohm
Lead Channel Pacing Threshold Amplitude: 1 V
Lead Channel Pacing Threshold Amplitude: 1 V
Lead Channel Pacing Threshold Amplitude: 1.5 V
Lead Channel Pacing Threshold Pulse Width: 0.4 ms
Lead Channel Pacing Threshold Pulse Width: 0.4 ms
Lead Channel Sensing Intrinsic Amplitude: 3.0514
Lead Channel Setting Pacing Amplitude: 2.5 V
Lead Channel Setting Pacing Pulse Width: 0.4 ms
Lead Channel Setting Sensing Sensitivity: 0.3 mV
MDC IDC MSMT LEADCHNL RV PACING THRESHOLD PULSEWIDTH: 0.4 ms
MDC IDC MSMT LEADCHNL RV SENSING INTR AMPL: 4.4013
MDC IDC SET LEADCHNL LV PACING AMPLITUDE: 2.5 V
MDC IDC SET LEADCHNL LV PACING PULSEWIDTH: 0.4 ms
MDC IDC SET LEADCHNL RA PACING AMPLITUDE: 2 V
MDC IDC STAT BRADY AP VP PERCENT: 0.83 %
MDC IDC STAT BRADY AP VS PERCENT: 0.01 %
MDC IDC STAT BRADY AS VP PERCENT: 99.15 %
MDC IDC STAT BRADY AS VS PERCENT: 0.02 %
Zone Setting Detection Interval: 300 ms
Zone Setting Detection Interval: 350 ms
Zone Setting Detection Interval: 400 ms
Zone Setting Detection Interval: 430 ms

## 2014-10-15 NOTE — Patient Instructions (Signed)
Your physician wants you to follow-up in: 12 months with Dr. Knox Saliva will receive a reminder letter in the mail two months in advance. If you don't receive a letter, please call our office to schedule the follow-up appointment.  Remote monitoring is used to monitor your Pacemaker of ICD from home. This monitoring reduces the number of office visits required to check your device to one time per year. It allows [place] to keep an eye on the functioning of your device to ensure it is working properly. You are scheduled for a device check from home on 11/14/14. You may send your transmission at any time that day. If you have a wireless device, the transmission will be sent automatically. After your physician reviews your transmission, you will receive a postcard with your next transmission date.

## 2014-10-15 NOTE — Assessment & Plan Note (Signed)
Her blood pressure is slightly elevated today. She notes that at home it is better. She also admits to sodium indiscretion. I've asked her to stop eating salt and salty food. She will continue her current medical therapy.

## 2014-10-15 NOTE — Assessment & Plan Note (Signed)
Her symptoms are class II. She admits to some dietary indiscretion. She will continue her current medications. I've encouraged the patient to reduce her sodium intake. If she develops peripheral edema, or shortness of breath, she is encouraged to increase her dose of furosemide by taking an extra pill a day as needed.

## 2014-10-15 NOTE — Assessment & Plan Note (Signed)
Her Medtronic I ventricular ICD is working normally. She is approaching elective replacement.

## 2014-10-15 NOTE — Progress Notes (Signed)
HPI Mrs. Radovich returns today for followup. She is a very pleasant 63 year old woman with a history of premature coronary disease, and ischemic cardiomyopathy, chronic systolic heart failure, status post ICD implantation. In the interim, the patient notes some dietary indiscretion with sodium. She denies shortness of breath. No ICD shocks.  No ICD discharge. She complains of nonexertional chest discomfort, which typically occurs when she stands up, after eating. Allergies  Allergen Reactions  . Codeine Anaphylaxis  . Digoxin And Related     Unknown  . Diltiazem     Unknown  . Latex     Latex tape  . Metolazone     Unknown  . Morphine Sulfate     Unknown  . Myrbetriq [Mirabegron]     Causes headaches  . Onglyza [Saxagliptin]     Causes migraines  . Penicillins     Unknown  . Septra Ds [Sulfamethoxazole W/Trimethoprim (Co-Trimoxazole)]     Unknown  . Spironolactone     Unknown  . Sulfa Antibiotics Other (See Comments)  . Sulfa Drugs Cross Reactors     Unknown  . Rema Fendt [Diltiazem Hcl]     Unknown  . Tetracyclines & Related     Unknown  . Ticlid [Ticlopidine Hcl]     Unknown  . Verapamil     Unknown  . Vicodin [Hydrocodone-Acetaminophen]     Unknown     Current Outpatient Prescriptions  Medication Sig Dispense Refill  . albuterol (PROVENTIL HFA;VENTOLIN HFA) 108 (90 BASE) MCG/ACT inhaler Inhale 2 puffs into the lungs every 4 (four) hours as needed for wheezing or shortness of breath.    . ALPRAZolam (XANAX) 1 MG tablet Take 0.5-1 mg by mouth 3 (three) times daily as needed (1 tab in the AM, 0.5 tab at noon and 0.5 tab in the PM).     . Artificial Tear GEL Apply 2 drops to eye daily as needed (dry eyes). \    . aspirin 325 MG EC tablet Take 325 mg by mouth daily.      [name] BIOTIN PO Take 1 capsule by mouth daily.    . cetirizine (ZYRTEC) 10 MG tablet Take 10 mg by mouth daily.    . Cholecalciferol (VITAMIN D-3) 1000 UNITS CAPS Take 1 capsule by mouth daily.     . clopidogrel (PLAVIX) 75 MG tablet Take 1 tablet by mouth  daily 90 tablet 0  . doxazosin (CARDURA) 2 MG tablet Take 2 mg by mouth daily.    . fluticasone (FLONASE) 50 MCG/ACT nasal spray Place 2 sprays into the nose daily as needed for allergies or rhinitis.     . Fluticasone-Salmeterol (ADVAIR) 100-50 MCG/DOSE AEPB Inhale 1 puff into the lungs daily as needed (shortness or breath or wheezing).     . furosemide (LASIX) 80 MG tablet Take 1 tablet by mouth two  times daily 180 tablet 0  . gabapentin (NEURONTIN) 300 MG capsule Take 300 mg by mouth 3 (three) times daily.    . insulin glargine (LANTUS) 100 UNIT/ML injection Inject 16 Units into the skin at bedtime.     . isosorbide mononitrate (IMDUR) 30 MG 24 hr tablet Take 1 tablet by mouth  daily 90 tablet 1  . metoprolol (LOPRESSOR) 100 MG tablet Take 1 tablet (100 mg total) by mouth 2 (two) times daily. 1 tab twice a day (Patient taking differently: Take 100 mg by mouth 2 (two) times daily. ) 180 tablet 2  . Multiple Vitamin (MULTIVITAMIN WITH  MINERALS) TABS Take 1 tablet by mouth daily.    . nitroGLYCERIN (NITROSTAT) 0.4 MG SL tablet Place 1 tablet (0.4 mg total) under the tongue every 5 (five) minutes as needed. For chest pain (Patient taking differently: Place 0.4 mg under the tongue every 5 (five) minutes as needed (MAX 3 TABLETS). For chest pain) 25 tablet 3  . ondansetron (ZOFRAN) 8 MG tablet Take 8 mg by mouth every 8 (eight) hours as needed. For nausea    . pantoprazole (PROTONIX) 40 MG tablet Take 40 mg by mouth daily.    . potassium chloride (K-DUR) 10 MEQ tablet Take 2 tablets (20 mEq total) by mouth 2 (two) times daily. 360 tablet 1  . ramipril (ALTACE) 10 MG capsule Take 1 capsule by mouth  twice daily 180 capsule 0  . simvastatin (ZOCOR) 20 MG tablet Take 1 tablet (20 mg total) by mouth at bedtime. 1 tab daily 90 tablet 2  . sodium chloride (MURO 128) 5 % ophthalmic ointment Place 1 drop into both eyes daily as needed. For dry eyes     . traMADol (ULTRAM) 50 MG tablet Take 100 mg by mouth every 6 (six) hours as needed (pain). For pain    . traZODone (DESYREL) 50 MG tablet Take 25 mg by mouth at bedtime as needed for sleep.    [name] venlafaxine (EFFEXOR) 100 MG tablet Take 2 tablets by mouth in the am 1 tablet by mouth in the pm    . ZETIA 10 MG tablet Take 1 tablet by mouth  daily 90 tablet 0   No current facility-administered medications for this visit.     Past Medical History  Diagnosis Date  . Ischemic cardiomyopathy     status post biventricular ICD placed by DR Edumunds who used to see Dr Melvern Banker here to establish  cardiovascular care.  . Hypertension   . Coronary artery disease   . CHF (congestive heart failure)   . MVP (mitral valve prolapse)     Antibiotics not required for procedures  . Asthma   . Anxiety   . Depression   . Hiatal hernia   . Cervical dysplasia   . Fibroid   . Function kidney decreased   . History of shingles   . Chronic kidney disease   . ICD (implantable cardiac defibrillator) in place   . Pacemaker   . Complication of anesthesia     "I wake up during surgeries" (02/14/2013)  . Anginal pain   . Myocardial infarction     "I've had 2; the others they were able to catch before completing" (02/14/2013)  . Pneumonia 1950's & 1985  . Shortness of breath     "lying down flat; at times w/exertion" (02/14/2013)  . Type II diabetes mellitus   . Iron deficiency anemia   . GERD (gastroesophageal reflux disease)   . History of stomach ulcers   . Migraines   . Stroke     "2 confirmed; 9 TIA's; results in dragging LLE; numbness in tip of tongue" (02/14/2013)    ROS:   All systems reviewed and negative except as noted in the HPI.   Past Surgical History  Procedure Laterality Date  . Insert / replace / remove pacemaker      biventricular defibrillator--06/10/ 2009  . Abdominal hysterectomy  1985    TAH   . Cardiac defibrillator placement    . Coronary angioplasty with stent placement       "started out w/5; bypass corrected some; 1  stent since the bypass" (02/14/2013)  . Breast excisional biopsy Left 01/2007; 06/2007; 03/2008    "benign" (02/14/2013)  . Cardiac catheterization      "probably in the teens" (02/14/2013)  . Colonoscopy  ~ 2002  . Tee without cardioversion  11/14/2012    Procedure: TRANSESOPHAGEAL ECHOCARDIOGRAM (TEE);  Surgeon: Candee Furbish, MD;  Location: [facility] ENDOSCOPY;  Service: Cardiovascular;  Laterality: N/A;  . Supraventricular tachycardia ablation  06/2007  . Biv icd genertaor change out  06/2007; 04/2008    "2 lead initial placement, at [facility]; done at [facility], after developing CHF" (02/14/2013)  . Coronary artery bypass graft  ` 1998    "CABG X3" (02/14/2013)  . Breast surgery    . Dilation and curettage of uterus  1975 X 2; 1976; 1977     Family History  Problem Relation Age of Onset  . Heart disease Mother   . Hypertension Mother   . Heart disease Father   . Hypertension Father   . Diabetes Father   . Kidney failure Father   . Heart disease Brother   . Diabetes Brother   . Kidney failure Brother   . Diabetes Paternal Grandmother      History   Social History  . Marital Status: Married    Spouse Name: [name]    Number of Children: 1  . Years of Education: MA   Occupational History  .      Disablity   Social History Main Topics  . Smoking status: Never Smoker   . Smokeless tobacco: Never Used  . Alcohol Use: Yes     Comment: 02/14/2013 "glass of wine q blue moon/vacation"  . Drug Use: No  . Sexual Activity: Yes    Birth Control/ Protection: Surgical   Other Topics Concern  . Not on file   Social History Narrative   Patient lives at home with spouse.     Daughter name is Tourist information centre manager.   Caffeine Use: none     There were no vitals taken for this visit.  Physical Exam:  Well appearing 63 year-old woman,NAD HEENT: Unremarkable Neck:  6 cm JVD, no thyromegally Back:  No CVA tenderness Lungs:  Clear with no wheezes, rales, or rhonchi. HEART:   Regular rate rhythm, no murmurs, no rubs, no clicks Abd:  soft, positive bowel sounds, no organomegally, no rebound, no guarding Ext:  2 plus pulses, no edema, no cyanosis, no clubbing Skin:  No rashes no nodules Neuro:  CN II through XII intact, motor grossly intact  EKG -  Normal sinus rhythm with biventricular pacing  DEVICE  Normal device function.  See PaceArt for details. Approaching elective replacement.  Assess/Plan:

## 2014-10-16 ENCOUNTER — Other Ambulatory Visit: Payer: Self-pay | Admitting: Internal Medicine

## 2014-10-21 ENCOUNTER — Encounter: Payer: Self-pay | Admitting: Cardiology

## 2014-10-21 DIAGNOSIS — N189 Chronic kidney disease, unspecified: Secondary | ICD-10-CM | POA: Diagnosis not present

## 2014-10-21 DIAGNOSIS — F332 Major depressive disorder, recurrent severe without psychotic features: Secondary | ICD-10-CM | POA: Diagnosis not present

## 2014-10-21 DIAGNOSIS — N183 Chronic kidney disease, stage 3 (moderate): Secondary | ICD-10-CM | POA: Diagnosis not present

## 2014-10-21 DIAGNOSIS — N2581 Secondary hyperparathyroidism of renal origin: Secondary | ICD-10-CM | POA: Diagnosis not present

## 2014-10-23 DIAGNOSIS — D631 Anemia in chronic kidney disease: Secondary | ICD-10-CM | POA: Diagnosis not present

## 2014-10-23 DIAGNOSIS — N2581 Secondary hyperparathyroidism of renal origin: Secondary | ICD-10-CM | POA: Diagnosis not present

## 2014-10-23 DIAGNOSIS — N183 Chronic kidney disease, stage 3 (moderate): Secondary | ICD-10-CM | POA: Diagnosis not present

## 2014-10-23 DIAGNOSIS — I129 Hypertensive chronic kidney disease with stage 1 through stage 4 chronic kidney disease, or unspecified chronic kidney disease: Secondary | ICD-10-CM | POA: Diagnosis not present

## 2014-11-02 ENCOUNTER — Other Ambulatory Visit: Payer: Self-pay | Admitting: Interventional Cardiology

## 2014-11-04 NOTE — Telephone Encounter (Signed)
Rx refill sent to patient pharmacy   

## 2014-11-11 ENCOUNTER — Ambulatory Visit (INDEPENDENT_AMBULATORY_CARE_PROVIDER_SITE_OTHER): Payer: Medicare Other | Admitting: Interventional Cardiology

## 2014-11-11 ENCOUNTER — Encounter: Payer: Self-pay | Admitting: Interventional Cardiology

## 2014-11-11 VITALS — BP 130/82 | HR 84 | Ht 64.5 in | Wt 183.0 lb

## 2014-11-11 DIAGNOSIS — I5022 Chronic systolic (congestive) heart failure: Secondary | ICD-10-CM

## 2014-11-11 DIAGNOSIS — I6340 Cerebral infarction due to embolism of unspecified cerebral artery: Secondary | ICD-10-CM

## 2014-11-11 DIAGNOSIS — I10 Essential (primary) hypertension: Secondary | ICD-10-CM | POA: Diagnosis not present

## 2014-11-11 DIAGNOSIS — I25810 Atherosclerosis of coronary artery bypass graft(s) without angina pectoris: Secondary | ICD-10-CM | POA: Diagnosis not present

## 2014-11-11 DIAGNOSIS — Z95810 Presence of automatic (implantable) cardiac defibrillator: Secondary | ICD-10-CM

## 2014-11-11 DIAGNOSIS — N183 Chronic kidney disease, stage 3 (moderate): Secondary | ICD-10-CM

## 2014-11-11 NOTE — Patient Instructions (Signed)
Your physician recommends that you continue on your current medications as directed. Please refer to the Current Medication list given to you today.  Your physician recommends that you weigh, daily, at the same time every day, and in the same amount of clothing. Please record your daily weights. Call in one week with 7 days of daily weights.  Your physician wants you to follow-up in: 6 months with Dr. Tamala Julian. You will receive a reminder letter in the mail two months in advance. If you don't receive a letter, please call our office to schedule the follow-up appointment.

## 2014-11-11 NOTE — Progress Notes (Signed)
Patient ID: [name], female   DOB: Mar 19, 1951, 64 y.o.   MRN: [id number]    [address] [place], [place]  [address] Phone: [phone] Fax:  [phone]  Date:  11/11/2014   ID:  [name], DOB 1951-02-14, MRN [id number]  PCP:  Reginia Naas, MD   ASSESSMENT:  1. Coronary artery disease with prior coronary bypass grafting 2. Chronic systolic heart failure, functional class II 3. ASCVD, biventricular 4. Essential hypertension 5. Chronic kidney disease, stage III  PLAN:  1. Weigh daily under the same circumstances 2. Salt restriction and care with fluid intake 3. Renew nitroglycerin prescription 4. Closely follow ICD and have battery change within the next 3 months when indicated   SUBJECTIVE: [name] is a 64 y.o. female who is done relatively well since being seen 6 months ago. Today she feels somewhat 5 he. She believes her blood sugar is low. She started feeling better after eating carbohydrates in the office. She has not had angina. She has occasional increases and lower extremity girth and dyspnea. She treats this by taking an additional furosemide tablet. Showed an early takes 80 mg twice a day. On the days of extra therapy she takes and additional 80 mg. She denies orthopnea, edema, PND, and dyspnea on exertion today. She denies chest pain.   Wt Readings from Last 3 Encounters:  11/11/14 183 lb (83.008 kg)  10/15/14 181 lb 6.4 oz (82.283 kg)  06/03/14 161 lb (73.029 kg)     Past Medical History  Diagnosis Date  . Ischemic cardiomyopathy     status post biventricular ICD placed by DR Edumunds who used to see Dr Melvern Banker here to establish  cardiovascular care.  . Hypertension   . Coronary artery disease   . CHF (congestive heart failure)   . MVP (mitral valve prolapse)     Antibiotics not required for procedures  . Asthma   . Anxiety   . Depression   . Hiatal hernia   . Cervical dysplasia   . Fibroid   .  Function kidney decreased   . History of shingles   . Chronic kidney disease   . ICD (implantable cardiac defibrillator) in place   . Pacemaker   . Complication of anesthesia     "I wake up during surgeries" (02/14/2013)  . Anginal pain   . Myocardial infarction     "I've had 2; the others they were able to catch before completing" (02/14/2013)  . Pneumonia 1950's & 1985  . Shortness of breath     "lying down flat; at times w/exertion" (02/14/2013)  . Type II diabetes mellitus   . Iron deficiency anemia   . GERD (gastroesophageal reflux disease)   . History of stomach ulcers   . Migraines   . Stroke     "2 confirmed; 9 TIA's; results in dragging LLE; numbness in tip of tongue" (02/14/2013)    Current Outpatient Prescriptions  Medication Sig Dispense Refill  . albuterol (PROVENTIL HFA;VENTOLIN HFA) 108 (90 BASE) MCG/ACT inhaler Inhale 2 puffs into the lungs every 4 (four) hours as needed for wheezing or shortness of breath.    . ALPRAZolam (XANAX) 1 MG tablet Take 0.5-1 mg by mouth 3 (three) times daily as needed (1 tab in the AM, 0.5 tab at noon and 0.5 tab in the PM).     . Artificial Tear GEL Apply 2 drops to eye daily as needed (dry eyes). \    .  aspirin 325 MG EC tablet Take 325 mg by mouth daily.      [name] BIOTIN PO Take 10,000 mcg by mouth daily. Biotin plus keratin    . cetirizine (ZYRTEC) 10 MG tablet Take 10 mg by mouth daily.    . Cholecalciferol (VITAMIN D-3) 1000 UNITS CAPS Take 1 capsule by mouth daily.    . clopidogrel (PLAVIX) 75 MG tablet Take 1 tablet by mouth  daily 90 tablet 0  . [employer] BLACK COHOSH PO Take 40 mg by mouth daily. Take 2 tabs daily 80 mg  total    . doxazosin (CARDURA) 4 MG tablet Take 4 mg by mouth daily.    . fluticasone (FLONASE) 50 MCG/ACT nasal spray Place 2 sprays into the nose daily as needed for allergies or rhinitis.     . Fluticasone-Salmeterol (ADVAIR) 100-50 MCG/DOSE AEPB Inhale 1 puff into the lungs daily as needed (shortness or breath or wheezing).      . furosemide (LASIX) 80 MG tablet Take 1 tablet by mouth two  times daily 180 tablet 0  . gabapentin (NEURONTIN) 600 MG tablet Take 600 mg by mouth 3 (three) times daily.    . insulin glargine (LANTUS) 100 UNIT/ML injection Inject 16 Units into the skin at bedtime.     . isosorbide mononitrate (IMDUR) 30 MG 24 hr tablet Take 1 tablet by mouth  daily 90 tablet 1  . metoprolol (LOPRESSOR) 100 MG tablet Take 1 tablet (100 mg total) by mouth 2 (two) times daily. 1 tab twice a day (Patient taking differently: Take 100 mg by mouth 2 (two) times daily. ) 180 tablet 2  . Multiple Vitamin (MULTIVITAMIN WITH MINERALS) TABS Take 1 tablet by mouth daily.    . nitroGLYCERIN (NITROSTAT) 0.4 MG SL tablet Place 1 tablet (0.4 mg total) under the tongue every 5 (five) minutes as needed. For chest pain (Patient taking differently: Place 0.4 mg under the tongue every 5 (five) minutes as needed (MAX 3 TABLETS). For chest pain) 25 tablet 3  . ondansetron (ZOFRAN) 8 MG tablet Take 8 mg by mouth every 8 (eight) hours as needed. For nausea    . pantoprazole (PROTONIX) 40 MG tablet Take 40 mg by mouth daily.    . potassium chloride (K-DUR) 10 MEQ tablet Take 2 tablets (20 mEq total) by mouth 2 (two) times daily. 360 tablet 1  . ramipril (ALTACE) 10 MG capsule Take 1 capsule by mouth  twice daily 180 capsule 0  . simvastatin (ZOCOR) 20 MG tablet Take 1 tablet (20 mg total) by mouth at bedtime. 1 tab daily 90 tablet 2  . sodium chloride (MURO 128) 5 % ophthalmic ointment Place 1 drop into both eyes daily as needed. For dry eyes    . traMADol (ULTRAM) 50 MG tablet Take 100 mg by mouth every 6 (six) hours as needed (pain). For pain    . traZODone (DESYREL) 50 MG tablet Take 25 mg by mouth at bedtime as needed for sleep.    [name] venlafaxine (EFFEXOR) 100 MG tablet Take 2 tablets by mouth in the am 1 tablet by mouth in the pm    . ZETIA 10 MG tablet Take 1 tablet by mouth  daily 90 tablet 0   No current facility-administered  medications for this visit.    Allergies:    Allergies  Allergen Reactions  . Codeine Anaphylaxis  . Digoxin And Related     Unknown  . Diltiazem     Unknown  . Latex  Latex tape  . Metolazone     Unknown  . Morphine Sulfate     Unknown  . Myrbetriq [Mirabegron]     Causes headaches  . Onglyza [Saxagliptin]     Causes migraines  . Penicillins     Unknown  . Septra Ds [Sulfamethoxazole W/Trimethoprim (Co-Trimoxazole)]     Unknown  . Spironolactone     Unknown  . Sulfa Antibiotics Other (See Comments)  . Sulfa Drugs Cross Reactors     Unknown  . Rema Fendt [Diltiazem Hcl]     Unknown  . Tetracyclines & Related     Unknown  . Ticlid [Ticlopidine Hcl]     Unknown  . Verapamil     Unknown  . Vicodin [Hydrocodone-Acetaminophen]     Unknown    Social History:  The patient  reports that she has never smoked. She has never used smokeless tobacco. She reports that she drinks alcohol. She reports that she does not use illicit drugs.   ROS:  Please see the history of present illness.   No neurological complaints. She has not had syncope. Appetite is been stable. Vision is decreased.   All other systems reviewed and negative.   OBJECTIVE: VS:  BP 130/82 mmHg  Pulse 84  Ht 5' 4.5" (1.638 m)  Wt 183 lb (83.008 kg)  BMI 30.94 kg/m2  SpO2 93% Well nourished, well developed, in no acute distress, obese HEENT: normal Neck: JVD relatively flat. Carotid bruit absent  Cardiac:  normal S1, S2; RRR; 2/6 apical systolic murmur is heard murmur Lungs:  clear to auscultation bilaterally, no wheezing, rhonchi or rales Abd: soft, nontender, no hepatomegaly Ext: Edema absent. Pulses 2+ Skin: warm and dry Neuro:  CNs 2-12 intact, no focal abnormalities noted  EKG:  Not performed       Signed, Illene Labrador III, MD 11/11/2014 5:59 PM

## 2014-11-14 ENCOUNTER — Ambulatory Visit: Payer: 59 | Admitting: Neurology

## 2014-11-14 ENCOUNTER — Ambulatory Visit (INDEPENDENT_AMBULATORY_CARE_PROVIDER_SITE_OTHER): Payer: Medicare Other | Admitting: *Deleted

## 2014-11-14 DIAGNOSIS — Z95810 Presence of automatic (implantable) cardiac defibrillator: Secondary | ICD-10-CM

## 2014-11-14 DIAGNOSIS — H18832 Recurrent erosion of cornea, left eye: Secondary | ICD-10-CM | POA: Diagnosis not present

## 2014-11-14 LAB — MDC_IDC_ENUM_SESS_TYPE_REMOTE
Brady Statistic AP VP Percent: 0.07 %
Brady Statistic AP VS Percent: 0.01 %
Brady Statistic AS VP Percent: 99.91 %
Brady Statistic RA Percent Paced: 0.08 %
Brady Statistic RV Percent Paced: 99.98 %
Date Time Interrogation Session: 20160107160228
HIGH POWER IMPEDANCE MEASURED VALUE: 41 Ohm
HighPow Impedance: 49 Ohm
Lead Channel Impedance Value: 464 Ohm
Lead Channel Impedance Value: 560 Ohm
Lead Channel Sensing Intrinsic Amplitude: 3.0514
Lead Channel Setting Pacing Amplitude: 2 V
Lead Channel Setting Pacing Amplitude: 2.5 V
Lead Channel Setting Pacing Pulse Width: 0.4 ms
MDC IDC MSMT BATTERY VOLTAGE: 2.62 V
MDC IDC SET LEADCHNL LV PACING AMPLITUDE: 2.5 V
MDC IDC SET LEADCHNL LV PACING PULSEWIDTH: 0.4 ms
MDC IDC SET LEADCHNL RV SENSING SENSITIVITY: 0.3 mV
MDC IDC SET ZONE DETECTION INTERVAL: 300 ms
MDC IDC SET ZONE DETECTION INTERVAL: 430 ms
MDC IDC STAT BRADY AS VS PERCENT: 0.01 %
Zone Setting Detection Interval: 350 ms
Zone Setting Detection Interval: 400 ms

## 2014-11-14 NOTE — Progress Notes (Signed)
Remote ICD transmission.   

## 2014-11-16 ENCOUNTER — Other Ambulatory Visit: Payer: Self-pay | Admitting: Interventional Cardiology

## 2014-11-18 ENCOUNTER — Telehealth: Payer: Self-pay

## 2014-11-18 DIAGNOSIS — H18832 Recurrent erosion of cornea, left eye: Secondary | ICD-10-CM | POA: Diagnosis not present

## 2014-11-18 NOTE — Telephone Encounter (Signed)
Pt called to give weights for 1 week as instructed at o/v with Dr.Smith  11/12/14 182lb 11/13/14 180.8lb 11/14/14 179.8lb 11/15/17 178.8 lb 11/16/14 179.8lb 11/17/14 181.0 lb 11/18/14 178.8lb  Pt is doing well no sob, no swelling.  Adv her I will fwd an update to Dr.Smith and call back with his recommendation. She verbalized understanding.

## 2014-11-19 NOTE — Telephone Encounter (Signed)
Continue to weigh daily. Weights greater than 181 pounds should be treated with extra diuretic. This should occur until the weight is down under 179 pounds.

## 2014-11-20 DIAGNOSIS — M5442 Lumbago with sciatica, left side: Secondary | ICD-10-CM | POA: Diagnosis not present

## 2014-11-20 NOTE — Telephone Encounter (Signed)
Pt aware of Dr.Smith's recommendations. Continue to weigh daily. Weights greater than 181 pounds should be treated with extra diuretic. This should occur until the weight is down under 179 pounds.pt verbalized understanding

## 2014-11-21 ENCOUNTER — Other Ambulatory Visit: Payer: Self-pay | Admitting: Physical Medicine and Rehabilitation

## 2014-11-21 DIAGNOSIS — M5442 Lumbago with sciatica, left side: Secondary | ICD-10-CM

## 2014-11-25 ENCOUNTER — Encounter: Payer: Self-pay | Admitting: Cardiology

## 2014-11-27 ENCOUNTER — Ambulatory Visit
Admission: RE | Admit: 2014-11-27 | Discharge: 2014-11-27 | Disposition: A | Discharge status: Home / Self Care | Payer: Medicare Other | Source: Ambulatory Visit | Attending: Physical Medicine and Rehabilitation | Admitting: Physical Medicine and Rehabilitation

## 2014-11-27 DIAGNOSIS — M47817 Spondylosis without myelopathy or radiculopathy, lumbosacral region: Secondary | ICD-10-CM | POA: Diagnosis not present

## 2014-11-27 DIAGNOSIS — M5126 Other intervertebral disc displacement, lumbar region: Secondary | ICD-10-CM | POA: Diagnosis not present

## 2014-11-27 DIAGNOSIS — M5442 Lumbago with sciatica, left side: Secondary | ICD-10-CM

## 2014-12-06 DIAGNOSIS — M5416 Radiculopathy, lumbar region: Secondary | ICD-10-CM | POA: Diagnosis not present

## 2014-12-06 DIAGNOSIS — M5442 Lumbago with sciatica, left side: Secondary | ICD-10-CM | POA: Diagnosis not present

## 2014-12-09 ENCOUNTER — Encounter: Payer: Self-pay | Admitting: Internal Medicine

## 2014-12-12 DIAGNOSIS — M5442 Lumbago with sciatica, left side: Secondary | ICD-10-CM | POA: Diagnosis not present

## 2014-12-12 DIAGNOSIS — M546 Pain in thoracic spine: Secondary | ICD-10-CM | POA: Diagnosis not present

## 2014-12-12 DIAGNOSIS — M4690 Unspecified inflammatory spondylopathy, site unspecified: Secondary | ICD-10-CM | POA: Diagnosis not present

## 2014-12-16 ENCOUNTER — Ambulatory Visit (INDEPENDENT_AMBULATORY_CARE_PROVIDER_SITE_OTHER): Payer: Medicare Other | Admitting: *Deleted

## 2014-12-16 ENCOUNTER — Telehealth: Payer: Self-pay | Admitting: Cardiology

## 2014-12-16 DIAGNOSIS — Z95810 Presence of automatic (implantable) cardiac defibrillator: Secondary | ICD-10-CM

## 2014-12-16 LAB — MDC_IDC_ENUM_SESS_TYPE_REMOTE
Brady Statistic AP VP Percent: 0.12 %
Brady Statistic AS VP Percent: 99.85 %
Brady Statistic AS VS Percent: 0.02 %
Brady Statistic RA Percent Paced: 0.13 %
Brady Statistic RV Percent Paced: 99.97 %
Date Time Interrogation Session: 20160208153957
HIGH POWER IMPEDANCE MEASURED VALUE: 51 Ohm
HighPow Impedance: 40 Ohm
Lead Channel Impedance Value: 504 Ohm
Lead Channel Impedance Value: 544 Ohm
Lead Channel Setting Pacing Amplitude: 2 V
Lead Channel Setting Pacing Amplitude: 2.5 V
Lead Channel Setting Pacing Amplitude: 2.5 V
Lead Channel Setting Pacing Pulse Width: 0.4 ms
Lead Channel Setting Pacing Pulse Width: 0.4 ms
Lead Channel Setting Sensing Sensitivity: 0.3 mV
MDC IDC MSMT BATTERY VOLTAGE: 2.62 V
MDC IDC MSMT LEADCHNL RA SENSING INTR AMPL: 2.5916
MDC IDC SET ZONE DETECTION INTERVAL: 350 ms
MDC IDC STAT BRADY AP VS PERCENT: 0.01 %
Zone Setting Detection Interval: 300 ms
Zone Setting Detection Interval: 400 ms
Zone Setting Detection Interval: 430 ms

## 2014-12-16 NOTE — Telephone Encounter (Signed)
Spoke with pt and reminded pt of remote transmission that is due today. Pt verbalized understanding.   

## 2014-12-16 NOTE — Progress Notes (Signed)
Remote ICD transmission.   

## 2014-12-18 DIAGNOSIS — M5442 Lumbago with sciatica, left side: Secondary | ICD-10-CM | POA: Diagnosis not present

## 2014-12-22 ENCOUNTER — Encounter: Payer: Self-pay | Admitting: Internal Medicine

## 2014-12-23 ENCOUNTER — Telehealth: Payer: Self-pay | Admitting: Cardiology

## 2014-12-23 ENCOUNTER — Other Ambulatory Visit: Payer: Self-pay | Admitting: Nurse Practitioner

## 2014-12-23 DIAGNOSIS — M5442 Lumbago with sciatica, left side: Secondary | ICD-10-CM | POA: Diagnosis not present

## 2014-12-23 DIAGNOSIS — I471 Supraventricular tachycardia: Secondary | ICD-10-CM

## 2014-12-23 MED ORDER — EZETIMIBE 10 MG PO TABS: ORAL_TABLET | ORAL | Status: DC

## 2014-12-23 MED ORDER — SIMVASTATIN 20 MG PO TABS: 20.0000 mg | ORAL_TABLET | Freq: Every day | ORAL | Status: DC

## 2014-12-23 MED ORDER — METOPROLOL TARTRATE 100 MG PO TABS: 100.0000 mg | ORAL_TABLET | Freq: Two times a day (BID) | ORAL | Status: DC

## 2014-12-23 NOTE — Telephone Encounter (Signed)
Pt aware that device has reached ERI and a scheduler will be calling to schedule an appt for her to be seen.

## 2014-12-24 DIAGNOSIS — E780 Pure hypercholesterolemia: Secondary | ICD-10-CM | POA: Diagnosis not present

## 2014-12-24 DIAGNOSIS — E1165 Type 2 diabetes mellitus with hyperglycemia: Secondary | ICD-10-CM | POA: Diagnosis not present

## 2014-12-24 DIAGNOSIS — R946 Abnormal results of thyroid function studies: Secondary | ICD-10-CM | POA: Diagnosis not present

## 2014-12-24 DIAGNOSIS — I10 Essential (primary) hypertension: Secondary | ICD-10-CM | POA: Diagnosis not present

## 2014-12-25 ENCOUNTER — Telehealth: Payer: Self-pay | Admitting: Internal Medicine

## 2014-12-25 ENCOUNTER — Telehealth: Payer: Self-pay | Admitting: Nurse Practitioner

## 2014-12-25 NOTE — Telephone Encounter (Signed)
Patient complained of small aching chest pain, a 6 on the pain scale, weakness, and on left arm elbow to fingers is tingly. Patient has a history of strokes, but does not feel she is having a stroke. Patient has leg swelling and took extra lasix earlier at 8:00 am. Patient has not voided yet. Patient had device transmitted to EP clinic. EP clinic stated that patient had not had any episodes but her fluid is up. Patient has nitroglycerin but has not taken, patient instructed to take one nitroglycerin to see if chest pain improves. Patient denies chest pain after taking nitroglycerin, but has a headache now due to nitroglycerin. Patient encouraged to void and see if weakness and swelling improves. Patient has an appointment tomorrow for her device. Encouraged patient to call office with any other concerns and to call 911 if she has any symptoms of a stroke. Patient verbalized understanding.

## 2014-12-25 NOTE — Telephone Encounter (Signed)
New Message        Pharmacist calling wanting to speak to [name] in regards to rx clarification for this pt. Please call back and advise.

## 2014-12-25 NOTE — Telephone Encounter (Signed)
New message  Pt wanted to speak w/ Rn about chest discomfort she has been feeling today, pt can feel her heart racing and is feeling very sluggish. Please call back and discuss.

## 2014-12-25 NOTE — Telephone Encounter (Deleted)
Wants call back at [phone]-has appt tomorrow-wants appt today-left arm tingling-chest pain-lightheadedness

## 2014-12-25 NOTE — Telephone Encounter (Signed)
Can you call for me?

## 2014-12-25 NOTE — Telephone Encounter (Signed)
[name] called pt to confirm patient's appointment for tomorrow and she was told by the patient that she was having CP, lightheadedness and left arm tingling from elbow to fingers and wanted to be seen today.  She is requesting a call today to be seen at [phone].  I will forward to triage to handle STAT

## 2014-12-26 ENCOUNTER — Encounter: Payer: Self-pay | Admitting: Internal Medicine

## 2014-12-26 ENCOUNTER — Encounter: Payer: Self-pay | Admitting: *Deleted

## 2014-12-26 ENCOUNTER — Ambulatory Visit (INDEPENDENT_AMBULATORY_CARE_PROVIDER_SITE_OTHER): Payer: Medicare Other | Admitting: Internal Medicine

## 2014-12-26 ENCOUNTER — Other Ambulatory Visit: Payer: Self-pay

## 2014-12-26 ENCOUNTER — Encounter: Payer: Self-pay | Admitting: Cardiology

## 2014-12-26 ENCOUNTER — Other Ambulatory Visit: Payer: Self-pay | Admitting: *Deleted

## 2014-12-26 VITALS — BP 142/84 | HR 78 | Ht 64.5 in | Wt 176.8 lb

## 2014-12-26 DIAGNOSIS — I10 Essential (primary) hypertension: Secondary | ICD-10-CM

## 2014-12-26 DIAGNOSIS — I5022 Chronic systolic (congestive) heart failure: Secondary | ICD-10-CM | POA: Diagnosis not present

## 2014-12-26 DIAGNOSIS — I2510 Atherosclerotic heart disease of native coronary artery without angina pectoris: Secondary | ICD-10-CM

## 2014-12-26 DIAGNOSIS — Z95810 Presence of automatic (implantable) cardiac defibrillator: Secondary | ICD-10-CM

## 2014-12-26 DIAGNOSIS — I509 Heart failure, unspecified: Secondary | ICD-10-CM

## 2014-12-26 DIAGNOSIS — I6340 Cerebral infarction due to embolism of unspecified cerebral artery: Secondary | ICD-10-CM

## 2014-12-26 DIAGNOSIS — I471 Supraventricular tachycardia: Secondary | ICD-10-CM

## 2014-12-26 MED ORDER — SIMVASTATIN 20 MG PO TABS: 20.0000 mg | ORAL_TABLET | Freq: Every day | ORAL | Status: DC

## 2014-12-26 MED ORDER — METOPROLOL TARTRATE 100 MG PO TABS: 100.0000 mg | ORAL_TABLET | Freq: Two times a day (BID) | ORAL | Status: DC

## 2014-12-26 MED ORDER — EZETIMIBE 10 MG PO TABS: ORAL_TABLET | ORAL | Status: DC

## 2014-12-26 NOTE — Patient Instructions (Signed)
See instruction sheet for procedure  Your physician recommends that you schedule a follow-up appointment in: 7-10 days from 01/29/15 for wound check in the device clinic

## 2014-12-26 NOTE — Assessment & Plan Note (Signed)
Her episode of chest pressure is atypical for angina although it did improve with SLNTG. I discussed the possible causes. If her symptoms increase in frequency and severity, particularly if they occur with exertion, she is instructed to go to the ER.

## 2014-12-26 NOTE — Progress Notes (Signed)
HPI [name] returns today for followup. She is a very pleasant 64 year old woman with a history of premature coronary disease, and ischemic cardiomyopathy, chronic systolic heart failure, status post ICD implantation. In the interim, the patient notes some dietary indiscretion with sodium. She denies shortness of breath. No ICD shocks.  No ICD discharge. She complains of nonexertional chest discomfort. No associated sob or radiation.  Yesterday she had chest pressure and took a ntg and had relief. She has reached ERI on her ICD.  Allergies  Allergen Reactions  . Codeine Anaphylaxis  . Digoxin And Related     Unknown  . Diltiazem     Unknown  . Latex     Latex tape  . Metolazone     Unknown  . Morphine Sulfate     Unknown  . Myrbetriq [Mirabegron]     Causes headaches  . Onglyza [Saxagliptin]     Causes migraines  . Penicillins     Unknown  . Septra Ds [Sulfamethoxazole W/Trimethoprim (Co-Trimoxazole)]     Unknown  . Spironolactone     Unknown  . Sulfa Antibiotics Other (See Comments)  . Sulfa Drugs Cross Reactors     Unknown  . Rema Fendt [Diltiazem Hcl]     Unknown  . Tetracyclines & Related     Unknown  . Ticlid [Ticlopidine Hcl]     Unknown  . Verapamil     Unknown  . Vicodin [Hydrocodone-Acetaminophen]     Unknown     Current Outpatient Prescriptions  Medication Sig Dispense Refill  . albuterol (PROVENTIL HFA;VENTOLIN HFA) 108 (90 BASE) MCG/ACT inhaler Inhale 2 puffs into the lungs every 4 (four) hours as needed for wheezing or shortness of breath.    . ALPRAZolam (XANAX) 1 MG tablet Take 0.5-1 mg by mouth 3 (three) times daily as needed (1 tab in the AM, 0.5 tab at noon and 0.5 tab in the PM).     . Artificial Tear GEL Apply 2 drops to eye daily as needed (dry eyes). \    . aspirin 325 MG EC tablet Take 325 mg by mouth daily.      [name] BIOTIN PO Take 10,000 mcg by mouth daily. Biotin plus keratin    . cetirizine (ZYRTEC) 10 MG tablet Take 10 mg by  mouth daily.    . Cholecalciferol (VITAMIN D-3) 1000 UNITS CAPS Take 1 capsule by mouth daily.    . clopidogrel (PLAVIX) 75 MG tablet Take 1 tablet by mouth  daily 90 tablet 0  . [employer] BLACK COHOSH PO Take 40 mg by mouth daily. Take 2 tabs daily 80 mg  total    . doxazosin (CARDURA) 4 MG tablet Take 4 mg by mouth daily.    [name] ezetimibe (ZETIA) 10 MG tablet Take 1 tablet by mouth  daily 90 tablet 3  . fluticasone (FLONASE) 50 MCG/ACT nasal spray Place 2 sprays into the nose daily as needed for allergies or rhinitis.     . Fluticasone-Salmeterol (ADVAIR) 100-50 MCG/DOSE AEPB Inhale 1 puff into the lungs daily as needed (shortness or breath or wheezing).     . furosemide (LASIX) 80 MG tablet Take 1 tablet by mouth two  times daily 180 tablet 0  . insulin glargine (LANTUS) 100 UNIT/ML injection Inject 18 Units into the skin at bedtime.     . isosorbide mononitrate (IMDUR) 30 MG 24 hr tablet Take 1 tablet by mouth  daily 90 tablet 1  . metoprolol (LOPRESSOR) 100  MG tablet Take 1 tablet (100 mg total) by mouth 2 (two) times daily. 180 tablet 3  . Multiple Vitamin (MULTIVITAMIN WITH MINERALS) TABS Take 1 tablet by mouth daily.    [name] NITROSTAT 0.4 MG SL tablet Place 1 tablet under the tongue every 5 minutes as needed for chest pain, max 3 doses, go to er if no relief 25 tablet 2  . ondansetron (ZOFRAN) 8 MG tablet Take 8 mg by mouth every 8 (eight) hours as needed. For nausea    . pantoprazole (PROTONIX) 40 MG tablet Take 40 mg by mouth daily.    . potassium chloride (K-DUR) 10 MEQ tablet Take 2 tablets (20 mEq total) by mouth 2 (two) times daily. 360 tablet 1  . ramipril (ALTACE) 10 MG capsule Take 1 capsule by mouth  twice daily 180 capsule 0  . simvastatin (ZOCOR) 20 MG tablet Take 1 tablet (20 mg total) by mouth at bedtime. 1 tab daily 90 tablet 3  . sodium chloride (MURO 128) 5 % ophthalmic ointment Place 1 drop into both eyes daily as needed. For dry eyes    . traMADol (ULTRAM) 50 MG tablet Take 100 mg  by mouth every 6 (six) hours as needed (pain). For pain    . traZODone (DESYREL) 50 MG tablet Take 25 mg by mouth at bedtime as needed for sleep.    [name] venlafaxine (EFFEXOR) 100 MG tablet Take 2 tablets by mouth in the am 1 tablet by mouth in the pm     No current facility-administered medications for this visit.     Past Medical History  Diagnosis Date  . Ischemic cardiomyopathy     status post biventricular ICD placed by DR Edumunds who used to see Dr Melvern Banker here to establish  cardiovascular care.  . Hypertension   . Coronary artery disease   . CHF (congestive heart failure)   . MVP (mitral valve prolapse)     Antibiotics not required for procedures  . Asthma   . Anxiety   . Depression   . Hiatal hernia   . Cervical dysplasia   . Fibroid   . Function kidney decreased   . History of shingles   . Chronic kidney disease   . ICD (implantable cardiac defibrillator) in place   . Pacemaker   . Complication of anesthesia     "I wake up during surgeries" (02/14/2013)  . Anginal pain   . Myocardial infarction     "I've had 2; the others they were able to catch before completing" (02/14/2013)  . Pneumonia 1950's & 1985  . Shortness of breath     "lying down flat; at times w/exertion" (02/14/2013)  . Type II diabetes mellitus   . Iron deficiency anemia   . GERD (gastroesophageal reflux disease)   . History of stomach ulcers   . Migraines   . Stroke     "2 confirmed; 9 TIA's; results in dragging LLE; numbness in tip of tongue" (02/14/2013)    ROS:   All systems reviewed and negative except as noted in the HPI.   Past Surgical History  Procedure Laterality Date  . Insert / replace / remove pacemaker      biventricular defibrillator--06/10/ 2009  . Abdominal hysterectomy  1985    TAH   . Cardiac defibrillator placement    . Coronary angioplasty with stent placement      "started out w/5; bypass corrected some; 1 stent since the bypass" (02/14/2013)  . Breast excisional biopsy Left  01/2007; 06/2007; 03/2008    "benign" (02/14/2013)  . Cardiac catheterization      "probably in the teens" (02/14/2013)  . Colonoscopy  ~ 2002  . Tee without cardioversion  11/14/2012    Procedure: TRANSESOPHAGEAL ECHOCARDIOGRAM (TEE);  Surgeon: Candee Furbish, MD;  Location: [facility] ENDOSCOPY;  Service: Cardiovascular;  Laterality: N/A;  . Supraventricular tachycardia ablation  06/2007  . Biv icd genertaor change out  06/2007; 04/2008    "2 lead initial placement, at [facility]; done at [facility], after developing CHF" (02/14/2013)  . Coronary artery bypass graft  ` 1998    "CABG X3" (02/14/2013)  . Breast surgery    . Dilation and curettage of uterus  1975 X 2; 1976; 1977     Family History  Problem Relation Age of Onset  . Heart disease Mother   . Hypertension Mother   . Heart disease Father   . Hypertension Father   . Diabetes Father   . Kidney failure Father   . Heart disease Brother   . Diabetes Brother   . Kidney failure Brother   . Diabetes Paternal Grandmother   . Heart attack Mother   . Heart attack Father   . Heart attack Brother   . Stroke Mother   . Stroke Father      History   Social History  . Marital Status: Married    Spouse Name: [name]  . Number of Children: 1  . Years of Education: MA   Occupational History  .      Disablity   Social History Main Topics  . Smoking status: Never Smoker   . Smokeless tobacco: Never Used  . Alcohol Use: Yes     Comment: 02/14/2013 "glass of wine q blue moon/vacation"  . Drug Use: No  . Sexual Activity: Yes    Birth Control/ Protection: Surgical   Other Topics Concern  . Not on file   Social History Narrative   Patient lives at home with spouse.     Daughter name is Tourist information centre manager.   Caffeine Use: none     BP 142/84 mmHg  Pulse 78  Ht 5' 4.5" (1.638 m)  Wt 176 lb 12.8 oz (80.196 kg)  BMI 29.89 kg/m2  Physical Exam:  Well appearing 64 year-old woman,NAD HEENT: Unremarkable Neck:  6 cm JVD, no thyromegally Back:  No CVA  tenderness Lungs:  Clear with no wheezes, rales, or rhonchi. HEART:  Regular rate rhythm, no murmurs, no rubs, no clicks Abd:  soft, positive bowel sounds, no organomegally, no rebound, no guarding Ext:  2 plus pulses, no edema, no cyanosis, no clubbing Skin:  No rashes no nodules Neuro:  CN II through XII intact, motor grossly intact  EKG -  Normal sinus rhythm with biventricular pacing  DEVICE  Normal device function.  See PaceArt for details. She has reached elective replacement.  Assess/Plan:

## 2014-12-26 NOTE — Assessment & Plan Note (Signed)
Her device has reached ERI. We discussed the indications, risks/benefits/goals/expectations of ICD generator change out and she wishes to proceed.

## 2014-12-26 NOTE — Assessment & Plan Note (Signed)
Her symptoms remain class 2. She will continue her current medications.

## 2014-12-26 NOTE — Assessment & Plan Note (Signed)
Her blood pressure is slightly elevated. She is instructed to lose weight and continue her current meds and reduce her sodium intake.

## 2014-12-30 ENCOUNTER — Encounter: Payer: Self-pay | Admitting: Internal Medicine

## 2014-12-30 DIAGNOSIS — M5442 Lumbago with sciatica, left side: Secondary | ICD-10-CM | POA: Diagnosis not present

## 2014-12-31 NOTE — Telephone Encounter (Signed)
Called [employer] - they already had the information they needed.  No further questions at this time.

## 2015-01-01 DIAGNOSIS — M5442 Lumbago with sciatica, left side: Secondary | ICD-10-CM | POA: Diagnosis not present

## 2015-01-02 ENCOUNTER — Other Ambulatory Visit (HOSPITAL_COMMUNITY): Payer: Self-pay

## 2015-01-02 ENCOUNTER — Encounter (HOSPITAL_COMMUNITY): Payer: Self-pay | Admitting: *Deleted

## 2015-01-02 ENCOUNTER — Observation Stay (HOSPITAL_COMMUNITY)
Admission: EM | Admit: 2015-01-02 | Discharge: 2015-01-04 | Disposition: A | Discharge status: Home / Self Care | Payer: Medicare Other | Attending: Cardiology | Admitting: Cardiology

## 2015-01-02 ENCOUNTER — Emergency Department (HOSPITAL_COMMUNITY): Payer: Medicare Other

## 2015-01-02 DIAGNOSIS — Z794 Long term (current) use of insulin: Secondary | ICD-10-CM | POA: Diagnosis not present

## 2015-01-02 DIAGNOSIS — N183 Chronic kidney disease, stage 3 unspecified: Secondary | ICD-10-CM | POA: Diagnosis present

## 2015-01-02 DIAGNOSIS — R778 Other specified abnormalities of plasma proteins: Secondary | ICD-10-CM | POA: Diagnosis present

## 2015-01-02 DIAGNOSIS — R635 Abnormal weight gain: Secondary | ICD-10-CM | POA: Diagnosis not present

## 2015-01-02 DIAGNOSIS — Z95810 Presence of automatic (implantable) cardiac defibrillator: Secondary | ICD-10-CM | POA: Insufficient documentation

## 2015-01-02 DIAGNOSIS — E785 Hyperlipidemia, unspecified: Secondary | ICD-10-CM | POA: Diagnosis not present

## 2015-01-02 DIAGNOSIS — I129 Hypertensive chronic kidney disease with stage 1 through stage 4 chronic kidney disease, or unspecified chronic kidney disease: Secondary | ICD-10-CM | POA: Diagnosis not present

## 2015-01-02 DIAGNOSIS — Z7982 Long term (current) use of aspirin: Secondary | ICD-10-CM | POA: Diagnosis not present

## 2015-01-02 DIAGNOSIS — F419 Anxiety disorder, unspecified: Secondary | ICD-10-CM | POA: Diagnosis not present

## 2015-01-02 DIAGNOSIS — I10 Essential (primary) hypertension: Secondary | ICD-10-CM

## 2015-01-02 DIAGNOSIS — I5022 Chronic systolic (congestive) heart failure: Secondary | ICD-10-CM | POA: Insufficient documentation

## 2015-01-02 DIAGNOSIS — K219 Gastro-esophageal reflux disease without esophagitis: Secondary | ICD-10-CM | POA: Insufficient documentation

## 2015-01-02 DIAGNOSIS — I25110 Atherosclerotic heart disease of native coronary artery with unstable angina pectoris: Principal | ICD-10-CM | POA: Insufficient documentation

## 2015-01-02 DIAGNOSIS — R7989 Other specified abnormal findings of blood chemistry: Secondary | ICD-10-CM | POA: Insufficient documentation

## 2015-01-02 DIAGNOSIS — I25709 Atherosclerosis of coronary artery bypass graft(s), unspecified, with unspecified angina pectoris: Secondary | ICD-10-CM | POA: Diagnosis present

## 2015-01-02 DIAGNOSIS — R079 Chest pain, unspecified: Secondary | ICD-10-CM | POA: Diagnosis present

## 2015-01-02 DIAGNOSIS — J45909 Unspecified asthma, uncomplicated: Secondary | ICD-10-CM | POA: Diagnosis not present

## 2015-01-02 DIAGNOSIS — E119 Type 2 diabetes mellitus without complications: Secondary | ICD-10-CM | POA: Insufficient documentation

## 2015-01-02 DIAGNOSIS — F332 Major depressive disorder, recurrent severe without psychotic features: Secondary | ICD-10-CM | POA: Diagnosis not present

## 2015-01-02 DIAGNOSIS — I200 Unstable angina: Secondary | ICD-10-CM

## 2015-01-02 DIAGNOSIS — I252 Old myocardial infarction: Secondary | ICD-10-CM | POA: Insufficient documentation

## 2015-01-02 DIAGNOSIS — Z1211 Encounter for screening for malignant neoplasm of colon: Secondary | ICD-10-CM | POA: Diagnosis not present

## 2015-01-02 DIAGNOSIS — I255 Ischemic cardiomyopathy: Secondary | ICD-10-CM | POA: Insufficient documentation

## 2015-01-02 DIAGNOSIS — F329 Major depressive disorder, single episode, unspecified: Secondary | ICD-10-CM | POA: Diagnosis not present

## 2015-01-02 DIAGNOSIS — D509 Iron deficiency anemia, unspecified: Secondary | ICD-10-CM | POA: Diagnosis not present

## 2015-01-02 DIAGNOSIS — Z8673 Personal history of transient ischemic attack (TIA), and cerebral infarction without residual deficits: Secondary | ICD-10-CM | POA: Diagnosis not present

## 2015-01-02 DIAGNOSIS — E876 Hypokalemia: Secondary | ICD-10-CM | POA: Diagnosis not present

## 2015-01-02 DIAGNOSIS — K5900 Constipation, unspecified: Secondary | ICD-10-CM | POA: Diagnosis not present

## 2015-01-02 DIAGNOSIS — I209 Angina pectoris, unspecified: Secondary | ICD-10-CM

## 2015-01-02 DIAGNOSIS — I2582 Chronic total occlusion of coronary artery: Secondary | ICD-10-CM | POA: Diagnosis not present

## 2015-01-02 DIAGNOSIS — Z951 Presence of aortocoronary bypass graft: Secondary | ICD-10-CM | POA: Diagnosis not present

## 2015-01-02 DIAGNOSIS — D649 Anemia, unspecified: Secondary | ICD-10-CM | POA: Diagnosis not present

## 2015-01-02 DIAGNOSIS — F32A Depression, unspecified: Secondary | ICD-10-CM | POA: Diagnosis present

## 2015-01-02 DIAGNOSIS — E1165 Type 2 diabetes mellitus with hyperglycemia: Secondary | ICD-10-CM | POA: Diagnosis not present

## 2015-01-02 DIAGNOSIS — I341 Nonrheumatic mitral (valve) prolapse: Secondary | ICD-10-CM | POA: Insufficient documentation

## 2015-01-02 DIAGNOSIS — K449 Diaphragmatic hernia without obstruction or gangrene: Secondary | ICD-10-CM | POA: Diagnosis not present

## 2015-01-02 DIAGNOSIS — I639 Cerebral infarction, unspecified: Secondary | ICD-10-CM | POA: Diagnosis present

## 2015-01-02 LAB — CBC
HCT: 31.2 % — ABNORMAL LOW (ref 36.0–46.0)
Hemoglobin: 10.4 g/dL — ABNORMAL LOW (ref 12.0–15.0)
MCH: 29.5 pg (ref 26.0–34.0)
MCHC: 33.3 g/dL (ref 30.0–36.0)
MCV: 88.6 fL (ref 78.0–100.0)
Platelets: 182 10*3/uL (ref 150–400)
RBC: 3.52 MIL/uL — ABNORMAL LOW (ref 3.87–5.11)
RDW: 13 % (ref 11.5–15.5)
WBC: 5.6 10*3/uL (ref 4.0–10.5)

## 2015-01-02 LAB — BASIC METABOLIC PANEL
ANION GAP: 9 (ref 5–15)
BUN: 9 mg/dL (ref 6–23)
CO2: 27 mmol/L (ref 19–32)
Calcium: 9.3 mg/dL (ref 8.4–10.5)
Chloride: 104 mmol/L (ref 96–112)
Creatinine, Ser: 1.57 mg/dL — ABNORMAL HIGH (ref 0.50–1.10)
GFR calc Af Amer: 40 mL/min — ABNORMAL LOW (ref >90)
GFR calc non Af Amer: 35 mL/min — ABNORMAL LOW (ref >90)
Glucose, Bld: 160 mg/dL — ABNORMAL HIGH (ref 70–99)
POTASSIUM: 3.6 mmol/L (ref 3.5–5.1)
Sodium: 140 mmol/L (ref 135–145)

## 2015-01-02 LAB — I-STAT TROPONIN, ED: Troponin i, poc: 0 ng/mL (ref 0.00–0.08)

## 2015-01-02 LAB — BRAIN NATRIURETIC PEPTIDE: B NATRIURETIC PEPTIDE 5: 61.5 pg/mL (ref 0.0–100.0)

## 2015-01-02 LAB — GLUCOSE, CAPILLARY: Glucose-Capillary: 155 mg/dL — ABNORMAL HIGH (ref 70–99)

## 2015-01-02 MED ORDER — ASPIRIN EC 81 MG PO TBEC
81.0000 mg | DELAYED_RELEASE_TABLET | Freq: Every day | ORAL | Status: DC
  Administered 2015-01-03 – 2015-01-04 (×2): 81 mg via ORAL
  Filled 2015-01-02 (×2): qty 1

## 2015-01-02 MED ORDER — SODIUM CHLORIDE 0.9 % IV SOLN
1.0000 mL/kg/h | INTRAVENOUS | Status: DC
  Administered 2015-01-03: 1 mL/kg/h via INTRAVENOUS

## 2015-01-02 MED ORDER — SODIUM CHLORIDE 0.9 % IV SOLN: 250.0000 mL | INTRAVENOUS | Status: DC | PRN

## 2015-01-02 MED ORDER — ATORVASTATIN CALCIUM 80 MG PO TABS
80.0000 mg | ORAL_TABLET | Freq: Every day | ORAL | Status: DC
  Administered 2015-01-03: 80 mg via ORAL
  Filled 2015-01-02: qty 1

## 2015-01-02 MED ORDER — CLOPIDOGREL BISULFATE 75 MG PO TABS
75.0000 mg | ORAL_TABLET | Freq: Every day | ORAL | Status: DC
  Administered 2015-01-02 – 2015-01-04 (×3): 75 mg via ORAL
  Filled 2015-01-02 (×3): qty 1

## 2015-01-02 MED ORDER — ASPIRIN 81 MG PO CHEW
81.0000 mg | CHEWABLE_TABLET | ORAL | Status: AC
  Administered 2015-01-03: 81 mg via ORAL
  Filled 2015-01-02: qty 1

## 2015-01-02 MED ORDER — SODIUM CHLORIDE 0.9 % IJ SOLN: 3.0000 mL | Freq: Two times a day (BID) | INTRAMUSCULAR | Status: DC

## 2015-01-02 MED ORDER — ASPIRIN 300 MG RE SUPP: 300.0000 mg | RECTAL | Status: DC

## 2015-01-02 MED ORDER — METOPROLOL TARTRATE 100 MG PO TABS
100.0000 mg | ORAL_TABLET | Freq: Two times a day (BID) | ORAL | Status: DC
  Administered 2015-01-02 – 2015-01-04 (×4): 100 mg via ORAL
  Filled 2015-01-02 (×4): qty 1

## 2015-01-02 MED ORDER — SODIUM CHLORIDE 0.9 % IJ SOLN: 3.0000 mL | INTRAMUSCULAR | Status: DC | PRN

## 2015-01-02 MED ORDER — HEPARIN (PORCINE) IN NACL 100-0.45 UNIT/ML-% IJ SOLN
1000.0000 [IU]/h | INTRAMUSCULAR | Status: DC
  Administered 2015-01-02: 1000 [IU]/h via INTRAVENOUS
  Filled 2015-01-02: qty 250

## 2015-01-02 MED ORDER — NITROGLYCERIN 0.4 MG SL SUBL: 0.4000 mg | SUBLINGUAL_TABLET | SUBLINGUAL | Status: DC | PRN

## 2015-01-02 MED ORDER — VENLAFAXINE HCL 50 MG PO TABS
100.0000 mg | ORAL_TABLET | Freq: Two times a day (BID) | ORAL | Status: DC
  Administered 2015-01-03 – 2015-01-04 (×2): 100 mg via ORAL
  Filled 2015-01-02 (×5): qty 2

## 2015-01-02 MED ORDER — NITROGLYCERIN IN D5W 200-5 MCG/ML-% IV SOLN
0.0000 ug/min | INTRAVENOUS | Status: DC
  Administered 2015-01-02: 5 ug/min via INTRAVENOUS
  Filled 2015-01-02: qty 250

## 2015-01-02 MED ORDER — EZETIMIBE 10 MG PO TABS
10.0000 mg | ORAL_TABLET | Freq: Every day | ORAL | Status: DC
  Administered 2015-01-03 – 2015-01-04 (×2): 10 mg via ORAL
  Filled 2015-01-02 (×3): qty 1

## 2015-01-02 MED ORDER — HEPARIN BOLUS VIA INFUSION
4000.0000 [IU] | Freq: Once | INTRAVENOUS | Status: AC
  Administered 2015-01-02: 4000 [IU] via INTRAVENOUS
  Filled 2015-01-02: qty 4000

## 2015-01-02 MED ORDER — ACETAMINOPHEN 325 MG PO TABS: 650.0000 mg | ORAL_TABLET | ORAL | Status: DC | PRN

## 2015-01-02 MED ORDER — ASPIRIN 81 MG PO CHEW
324.0000 mg | CHEWABLE_TABLET | ORAL | Status: DC
  Filled 2015-01-02: qty 4

## 2015-01-02 MED ORDER — ALPRAZOLAM 0.5 MG PO TABS
0.5000 mg | ORAL_TABLET | Freq: Three times a day (TID) | ORAL | Status: DC | PRN
  Administered 2015-01-02 – 2015-01-03 (×3): 0.5 mg via ORAL
  Administered 2015-01-04: 1 mg via ORAL
  Filled 2015-01-02: qty 1
  Filled 2015-01-02: qty 2
  Filled 2015-01-02 (×2): qty 1

## 2015-01-02 MED ORDER — TRAMADOL HCL 50 MG PO TABS
100.0000 mg | ORAL_TABLET | Freq: Four times a day (QID) | ORAL | Status: DC | PRN
  Administered 2015-01-02: 100 mg via ORAL
  Filled 2015-01-02: qty 2

## 2015-01-02 MED ORDER — MOMETASONE FURO-FORMOTEROL FUM 100-5 MCG/ACT IN AERO
2.0000 | INHALATION_SPRAY | Freq: Two times a day (BID) | RESPIRATORY_TRACT | Status: DC
  Administered 2015-01-02 – 2015-01-03 (×3): 2 via RESPIRATORY_TRACT
  Filled 2015-01-02 (×2): qty 8.8

## 2015-01-02 MED ORDER — TRAZODONE HCL 50 MG PO TABS: 25.0000 mg | ORAL_TABLET | Freq: Every evening | ORAL | Status: DC | PRN

## 2015-01-02 MED ORDER — DOXAZOSIN MESYLATE 4 MG PO TABS
4.0000 mg | ORAL_TABLET | Freq: Every day | ORAL | Status: DC
  Administered 2015-01-02 – 2015-01-03 (×2): 4 mg via ORAL
  Filled 2015-01-02 (×4): qty 1

## 2015-01-02 MED ORDER — PANTOPRAZOLE SODIUM 40 MG PO TBEC
40.0000 mg | DELAYED_RELEASE_TABLET | Freq: Every day | ORAL | Status: DC
  Administered 2015-01-03 – 2015-01-04 (×2): 40 mg via ORAL
  Filled 2015-01-02 (×3): qty 1

## 2015-01-02 MED ORDER — ONDANSETRON HCL 4 MG/2ML IJ SOLN: 4.0000 mg | Freq: Four times a day (QID) | INTRAMUSCULAR | Status: DC | PRN

## 2015-01-02 NOTE — ED Notes (Signed)
Pt with extensive cardiac hx to ED c/o chest pain, L sided, accompanied by nausea and sob while laying down on table, preparing for colonoscopy.  Took 2 nitro with no relief.  Is due for battery change ICD/pacemaker.   Pt took her 325 mg asa and plavix this am per her norm.

## 2015-01-02 NOTE — Consult Note (Signed)
CARDIOLOGY CONSULT NOTE   Patient ID: [name] MRN: [id number], DOB/AGE: 1951/04/07   Admit date: 01/02/2015 Date of Consult: 01/02/2015  Primary Physician: Reginia Naas, MD Primary Cardiologist: Dr Smith/Dr Lovena Le  Reason for consult:  Chest pain  Problem List  Past Medical History  Diagnosis Date  . Ischemic cardiomyopathy     status post biventricular ICD placed by DR Edumunds who used to see Dr Melvern Banker here to establish  cardiovascular care.  . Hypertension   . Coronary artery disease   . CHF (congestive heart failure)   . MVP (mitral valve prolapse)     Antibiotics not required for procedures  . Asthma   . Anxiety   . Depression   . Hiatal hernia   . Cervical dysplasia   . Fibroid   . Function kidney decreased   . History of shingles   . Chronic kidney disease   . ICD (implantable cardiac defibrillator) in place   . Pacemaker   . Complication of anesthesia     "I wake up during surgeries" (02/14/2013)  . Anginal pain   . Myocardial infarction     "I've had 2; the others they were able to catch before completing" (02/14/2013)  . Pneumonia 1950's & 1985  . Shortness of breath     "lying down flat; at times w/exertion" (02/14/2013)  . Type II diabetes mellitus   . Iron deficiency anemia   . GERD (gastroesophageal reflux disease)   . History of stomach ulcers   . Migraines   . Stroke     "2 confirmed; 9 TIA's; results in dragging LLE; numbness in tip of tongue" (02/14/2013)    Past Surgical History  Procedure Laterality Date  . Insert / replace / remove pacemaker      biventricular defibrillator--06/10/ 2009  . Abdominal hysterectomy  1985    TAH   . Cardiac defibrillator placement    . Coronary angioplasty with stent placement      "started out w/5; bypass corrected some; 1 stent since the bypass" (02/14/2013)  . Breast excisional biopsy Left 01/2007; 06/2007; 03/2008    "benign" (02/14/2013)  . Cardiac catheterization      "probably in the teens"  (02/14/2013)  . Colonoscopy  ~ 2002  . Tee without cardioversion  11/14/2012    Procedure: TRANSESOPHAGEAL ECHOCARDIOGRAM (TEE);  Surgeon: Candee Furbish, MD;  Location: [facility] ENDOSCOPY;  Service: Cardiovascular;  Laterality: N/A;  . Supraventricular tachycardia ablation  06/2007  . Biv icd genertaor change out  06/2007; 04/2008    "2 lead initial placement, at [facility]; done at [facility], after developing CHF" (02/14/2013)  . Coronary artery bypass graft  ` 1998    "CABG X3" (02/14/2013)  . Breast surgery    . Dilation and curettage of uterus  1975 X 2; 1976; 1977    Allergies  Allergies  Allergen Reactions  . Codeine Anaphylaxis  . Digoxin And Related     Unknown  . Diltiazem     Unknown  . Latex     Latex tape  . Metolazone     Unknown  . Morphine Sulfate     Unknown  . Myrbetriq [Mirabegron]     Causes headaches  . Onglyza [Saxagliptin]     Causes migraines  . Penicillins     Unknown  . Septra Ds [Sulfamethoxazole W/Trimethoprim (Co-Trimoxazole)]     Unknown  . Spironolactone     Unknown  . Sulfa Antibiotics Other (See Comments)  . Sulfa Drugs Cross  Reactors     Unknown  . Rema Fendt [Diltiazem Hcl]     Unknown  . Tetracyclines & Related     Unknown  . Ticlid [Ticlopidine Hcl]     Unknown  . Verapamil     Unknown  . Vicodin [Hydrocodone-Acetaminophen]     Unknown    HPI   [name] is a 64 y.o. female with known CAD, s/p CABG in 3 in 07/2013, ischemic CMP, s/p BiV ICD, last LVEF 40-45% on echo in 2014,  followed by Dr Tamala Julian, last seen in January 2016 when she was doing relatively well. She was seen by Dr Lovena Le and was found to reach ICD ERI and is scheduled for a generator change. Stable NYHA II.  She mentioned an episode of chest pain while in the office and was instructed to come to the ER if another occurs.  Today she presented with a typical retrosternal chest pain that has resolved with NTG, she had another brief episode of pain in the ER that resolved on its  own.  Inpatient Medications   . nitroGLYCERIN 15 mcg/min (01/02/15 1848)     Family History Family History  Problem Relation Age of Onset  . Heart disease Mother   . Hypertension Mother   . Heart disease Father   . Hypertension Father   . Diabetes Father   . Kidney failure Father   . Heart disease Brother   . Diabetes Brother   . Kidney failure Brother   . Diabetes Paternal Grandmother   . Heart attack Mother   . Heart attack Father   . Heart attack Brother   . Stroke Mother   . Stroke Father      Social History History   Social History  . Marital Status: Married    Spouse Name: [name]  . Number of Children: 1  . Years of Education: MA   Occupational History  .      Disablity   Social History Main Topics  . Smoking status: Never Smoker   . Smokeless tobacco: Never Used  . Alcohol Use: Yes     Comment: 02/14/2013 "glass of wine q blue moon/vacation"  . Drug Use: No  . Sexual Activity: Yes    Birth Control/ Protection: Surgical   Other Topics Concern  . Not on file   Social History Narrative   Patient lives at home with spouse.     Daughter name is Tourist information centre manager.   Caffeine Use: none     Review of Systems  General:  No chills, fever, night sweats or weight changes.  Cardiovascular:  No chest pain, dyspnea on exertion, edema, orthopnea, palpitations, paroxysmal nocturnal dyspnea. Dermatological: No rash, lesions/masses Respiratory: No cough, dyspnea Urologic: No hematuria, dysuria Abdominal:   No nausea, vomiting, diarrhea, bright red blood per rectum, melena, or hematemesis Neurologic:  No visual changes, wkns, changes in mental status. All other systems reviewed and are otherwise negative except as noted above.  Physical Exam  Blood pressure 119/76, pulse 78, temperature 98 F (36.7 C), temperature source Oral, resp. rate 18, height 5' 4.5" (1.638 m), weight 173 lb (78.472 kg), SpO2 96 %.  General: Pleasant, NAD Psych: Normal affect. Neuro: Alert and  oriented X 3. Moves all extremities spontaneously. HEENT: Normal  Neck: Supple without bruits or JVD. Lungs:  Resp regular and unlabored, CTA. Heart: RRR no s3, s4, or murmurs. Abdomen: Soft, non-tender, non-distended, BS + x 4.  Extremities: No clubbing, cyanosis or edema. DP/PT/Radials 2+ and  equal bilaterally.  Labs  No results for input(s): CKTOTAL, CKMB, TROPONINI in the last 72 hours. Lab Results  Component Value Date   WBC 5.6 01/02/2015   HGB 10.4* 01/02/2015   HCT 31.2* 01/02/2015   MCV 88.6 01/02/2015   PLT 182 01/02/2015    Recent Labs Lab 01/02/15 1717  NA 140  K 3.6  CL 104  CO2 27  BUN 9  CREATININE 1.57*  CALCIUM 9.3  GLUCOSE 160*   Lab Results  Component Value Date   CHOL 178 02/15/2013   HDL 54 02/15/2013   LDLCALC 94 02/15/2013   TRIG 152* 02/15/2013   Lab Results  Component Value Date   DDIMER * 12/07/2007    0.54        AT THE INHOUSE ESTABLISHED CUTOFF VALUE OF 0.48 ug/mL FEU, THIS ASSAY HAS BEEN DOCUMENTED IN THE LITERATURE TO HAVE   Invalid input(s): POCBNP  Radiology/Studies  Dg Chest Port 1 View  01/02/2015   CLINICAL DATA:  Chest pain for 1 day  EXAM: PORTABLE CHEST - 1 VIEW  COMPARISON:  02/14/2013  FINDINGS: A defibrillator is again seen. The cardiac shadow is stable. Postoperative changes are noted. The lungs are clear bilaterally. No bony abnormality is seen.  IMPRESSION: No acute abnormality noted.   Electronically Signed   By: Inez Catalina M.D.   On: 01/02/2015 17:57   Echocardiogram  - 11/2012 Left ventricle: The cavity size was normal. Systolic function was mildly to moderately reduced. The estimated ejection fraction was in the range of 40% to 45%. Diffuse hypokinesis. Features are consistent with a pseudonormal left ventricular filling pattern, with concomitant abnormal relaxation and increased filling pressure (grade 2 diastolic dysfunction). Doppler parameters are consistent with high ventricular filling  pressure. - Mitral valve: Mild regurgitation. Impressions:  - No cardiac source of embolism was identified, but cannot be ruled out on the basis of this examination  ECG:    ASSESSMENT AND PLAN  1. Unstable angina 2. Coronary artery disease with prior coronary bypass grafting 2. Chronic systolic heart failure, functional class II 3. ASCVD, biventricular 4. Essential hypertension 5. Chronic kidney disease, stage III  We will start Heparin drip, continue NTG drip, her first troponin is negative, ECG shows a-sensed, V paced rhythm with no acute changes.  We will keep NPO for a cath in the am. Continue aspirin 81 mg po daily, metoprolol 100 mg po BID, start atorvastatin 80 mg po daily. We will hold ramipril as she has CKD stage III and is going for a cath. Today's Crea 1.5, baseline 1.5 -1.7. Restart afterwards.   Signed, Dorothy Spark, MD, [facility] 01/02/2015, 7:33 PM

## 2015-01-02 NOTE — ED Provider Notes (Signed)
CSN: [id number]     Arrival date & time 01/02/15  1705 History   First MD Initiated Contact with Patient 01/02/15 1744     Chief Complaint  Patient presents with  . Chest Pain     (Consider location/radiation/quality/duration/timing/severity/associated sxs/prior Treatment) HPI Patient developed anterior left sided chest pain, nonradiating feeling like "a grabbing". Feels like heart pain she's had in the past. She was treated with 2 sublingual nitroglycerin, without relief. No associated shortness of breath nausea or sweatiness. She is been getting similar pain more frequently over the past week.. Presently describes pain as 7 on scale of 1-10. Past Medical History  Diagnosis Date  . Ischemic cardiomyopathy     status post biventricular ICD placed by DR Edumunds who used to see Dr Melvern Banker here to establish  cardiovascular care.  . Hypertension   . Coronary artery disease   . CHF (congestive heart failure)   . MVP (mitral valve prolapse)     Antibiotics not required for procedures  . Asthma   . Anxiety   . Depression   . Hiatal hernia   . Cervical dysplasia   . Fibroid   . Function kidney decreased   . History of shingles   . Chronic kidney disease   . ICD (implantable cardiac defibrillator) in place   . Pacemaker   . Complication of anesthesia     "I wake up during surgeries" (02/14/2013)  . Anginal pain   . Myocardial infarction     "I've had 2; the others they were able to catch before completing" (02/14/2013)  . Pneumonia 1950's & 1985  . Shortness of breath     "lying down flat; at times w/exertion" (02/14/2013)  . Type II diabetes mellitus   . Iron deficiency anemia   . GERD (gastroesophageal reflux disease)   . History of stomach ulcers   . Migraines   . Stroke     "2 confirmed; 9 TIA's; results in dragging LLE; numbness in tip of tongue" (02/14/2013)   Past Surgical History  Procedure Laterality Date  . Insert / replace / remove pacemaker      biventricular  defibrillator--06/10/ 2009  . Abdominal hysterectomy  1985    TAH   . Cardiac defibrillator placement    . Coronary angioplasty with stent placement      "started out w/5; bypass corrected some; 1 stent since the bypass" (02/14/2013)  . Breast excisional biopsy Left 01/2007; 06/2007; 03/2008    "benign" (02/14/2013)  . Cardiac catheterization      "probably in the teens" (02/14/2013)  . Colonoscopy  ~ 2002  . Tee without cardioversion  11/14/2012    Procedure: TRANSESOPHAGEAL ECHOCARDIOGRAM (TEE);  Surgeon: Candee Furbish, MD;  Location: [facility] ENDOSCOPY;  Service: Cardiovascular;  Laterality: N/A;  . Supraventricular tachycardia ablation  06/2007  . Biv icd genertaor change out  06/2007; 04/2008    "2 lead initial placement, at [facility]; done at [facility], after developing CHF" (02/14/2013)  . Coronary artery bypass graft  ` 1998    "CABG X3" (02/14/2013)  . Breast surgery    . Dilation and curettage of uterus  1975 X 2; 1976; 1977   Family History  Problem Relation Age of Onset  . Heart disease Mother   . Hypertension Mother   . Heart disease Father   . Hypertension Father   . Diabetes Father   . Kidney failure Father   . Heart disease Brother   . Diabetes Brother   . Kidney failure  Brother   . Diabetes Paternal Grandmother   . Heart attack Mother   . Heart attack Father   . Heart attack Brother   . Stroke Mother   . Stroke Father    History  Substance Use Topics  . Smoking status: Never Smoker   . Smokeless tobacco: Never Used  . Alcohol Use: Yes     Comment: 02/14/2013 "glass of wine q blue moon/vacation"   OB History    Gravida Para Term Preterm AB TAB SAB Ectopic Multiple Living   7 2  2 5     1      Review of Systems  Cardiovascular: Positive for chest pain.  All other systems reviewed and are negative.     Allergies  Codeine; Digoxin and related; Diltiazem; Latex; Metolazone; Morphine sulfate; Myrbetriq; Onglyza; Penicillins; Septra ds; Spironolactone; Sulfa antibiotics; Sulfa drugs cross  reactors; Taztia xt; Tetracyclines & related; Ticlid; Verapamil; and Vicodin  Home Medications   Prior to Admission medications   Medication Sig Start Date End Date Taking? Authorizing Provider  albuterol (PROVENTIL HFA;VENTOLIN HFA) 108 (90 BASE) MCG/ACT inhaler Inhale 2 puffs into the lungs every 4 (four) hours as needed for wheezing or shortness of breath.    Historical Provider, MD  ALPRAZolam Duanne Moron) 1 MG tablet Take 0.5-1 mg by mouth 3 (three) times daily as needed (1 tab in the AM, 0.5 tab at noon and 0.5 tab in the PM).     Historical Provider, MD  Artificial Tear GEL Apply 2 drops to eye daily as needed (dry eyes). \    Historical Provider, MD  aspirin 325 MG EC tablet Take 325 mg by mouth daily.      Historical Provider, MD  BIOTIN PO Take 10,000 mcg by mouth daily. Biotin plus keratin    Historical Provider, MD  cetirizine (ZYRTEC) 10 MG tablet Take 10 mg by mouth daily.    Historical Provider, MD  Cholecalciferol (VITAMIN D-3) 1000 UNITS CAPS Take 1 capsule by mouth daily.    Historical Provider, MD  clopidogrel (PLAVIX) 75 MG tablet Take 1 tablet by mouth  daily 10/14/14   Belva Crome III, MD  [facility] PO Take 40 mg by mouth daily. Take 2 tabs daily 80 mg  total    Historical Provider, MD  doxazosin (CARDURA) 4 MG tablet Take 4 mg by mouth daily.    Historical Provider, MD  ezetimibe (ZETIA) 10 MG tablet Take 1 tablet by mouth  daily 12/26/14   Sinclair Grooms, MD  fluticasone [facility]) 50 MCG/ACT nasal spray Place 2 sprays into the nose daily as needed for allergies or rhinitis.     Historical Provider, MD  Fluticasone-Salmeterol (ADVAIR) 100-50 MCG/DOSE AEPB Inhale 1 puff into the lungs daily as needed (shortness or breath or wheezing).     Historical Provider, MD  furosemide (LASIX) 80 MG tablet Take 1 tablet by mouth two  times daily 10/10/14   Belva Crome III, MD  insulin glargine (LANTUS) 100 UNIT/ML injection Inject 18 Units into the skin at bedtime.     Historical  Provider, MD  isosorbide mononitrate (IMDUR) 30 MG 24 hr tablet Take 1 tablet by mouth  daily 08/23/14   Belva Crome III, MD  metoprolol (LOPRESSOR) 100 MG tablet Take 1 tablet (100 mg total) by mouth 2 (two) times daily. 12/26/14   Belva Crome III, MD  Multiple Vitamin (MULTIVITAMIN WITH MINERALS) TABS Take 1 tablet by mouth daily.  Historical Provider, MD  NITROSTAT 0.4 MG SL tablet Place 1 tablet under the tongue every 5 minutes as needed for chest pain, max 3 doses, go to er if no relief 11/18/14   Belva Crome III, MD  ondansetron (ZOFRAN) 8 MG tablet Take 8 mg by mouth every 8 (eight) hours as needed. For nausea    Historical Provider, MD  pantoprazole (PROTONIX) 40 MG tablet Take 40 mg by mouth daily.    Historical Provider, MD  potassium chloride (K-DUR) 10 MEQ tablet Take 2 tablets (20 mEq total) by mouth 2 (two) times daily. 11/26/13   Belva Crome III, MD  ramipril (ALTACE) 10 MG capsule Take 1 capsule by mouth  twice daily 10/10/14   Belva Crome III, MD  simvastatin (ZOCOR) 20 MG tablet Take 1 tablet (20 mg total) by mouth at bedtime. 1 tab daily 12/26/14   Belva Crome III, MD  sodium chloride (MURO 128) 5 % ophthalmic ointment Place 1 drop into both eyes daily as needed. For dry eyes    Historical Provider, MD  traMADol (ULTRAM) 50 MG tablet Take 100 mg by mouth every 6 (six) hours as needed (pain). For pain    Historical Provider, MD  traZODone (DESYREL) 50 MG tablet Take 25 mg by mouth at bedtime as needed for sleep.    Historical Provider, MD  venlafaxine [facility]) 100 MG tablet Take 2 tablets by mouth in the am 1 tablet by mouth in the pm 04/12/11   Historical Provider, MD   BP 108/66 mmHg  Pulse 78  Temp(Src) 98 F (36.7 C) (Oral)  Resp 18  Ht 5' 4.5" (1.638 m)  Wt 173 lb (78.472 kg)  BMI 29.25 kg/m2  SpO2 96% Physical Exam  Constitutional: She appears well-developed and well-nourished.  HENT:  Head: Normocephalic and atraumatic.  Eyes: Conjunctivae are normal.  Pupils are equal, round, and reactive to light.  Neck: Neck supple. No tracheal deviation present. No thyromegaly present.  Cardiovascular: Normal rate and regular rhythm.   No murmur heard. Pulmonary/Chest: Effort normal and breath sounds normal.  Abdominal: Soft. Bowel sounds are normal. She exhibits no distension. There is no tenderness.  Obese  Musculoskeletal: Normal range of motion. She exhibits no edema or tenderness.  Neurological: She is alert. Coordination normal.  Skin: Skin is warm and dry. No rash noted.  Psychiatric: She has a normal mood and affect.  Nursing note and vitals reviewed.   ED Course  Procedures (including critical care time) Labs Review Labs Reviewed  CBC - Abnormal; Notable for the following:    RBC 3.52 (*)    Hemoglobin 10.4 (*)    HCT 31.2 (*)    All other components within normal limits  BASIC METABOLIC PANEL  BRAIN NATRIURETIC PEPTIDE  I-STAT TROPOININ, ED    Imaging Review No results found.   EKG Interpretation   Date/Time:  Thursday January 02 2015 17:11:45 EST Ventricular Rate:  78 PR Interval:  108 QRS Duration: 118 QT Interval:  422 QTC Calculation: 481 R Axis:   87 Text Interpretation:  Electronic ventricular pacemaker No significant  change since last tracing Confirmed by Elveria Lauderbaugh  MD, Dula Havlik [phone]) on  01/02/2015 5:57:31 PM     6:55 PM patient reports pain almost gone after treatment with intravenous nitroglycerin drip. Patient reports that she took her Plavix and 325 mg of aspirin routinely this morning. Results for orders placed or performed during the hospital encounter of 01/02/15  CBC  Result Value  Ref Range   WBC 5.6 4.0 - 10.5 K/uL   RBC 3.52 (L) 3.87 - 5.11 MIL/uL   Hemoglobin 10.4 (L) 12.0 - 15.0 g/dL   HCT 31.2 (L) 36.0 - 46.0 %   MCV 88.6 78.0 - 100.0 fL   MCH 29.5 26.0 - 34.0 pg   MCHC 33.3 30.0 - 36.0 g/dL   RDW 13.0 11.5 - 15.5 %   Platelets 182 150 - 400 K/uL  Basic metabolic panel  Result Value Ref  Range   Sodium 140 135 - 145 mmol/L   Potassium 3.6 3.5 - 5.1 mmol/L   Chloride 104 96 - 112 mmol/L   CO2 27 19 - 32 mmol/L   Glucose, Bld 160 (H) 70 - 99 mg/dL   BUN 9 6 - 23 mg/dL   Creatinine, Ser 1.57 (H) 0.50 - 1.10 mg/dL   Calcium 9.3 8.4 - 10.5 mg/dL   GFR calc non Af Amer 35 (L) >90 mL/min   GFR calc Af Amer 40 (L) >90 mL/min   Anion gap 9 5 - 15  BNP (order ONLY if patient complains of dyspnea/SOB AND you have documented it for THIS visit)  Result Value Ref Range   B Natriuretic Peptide 61.5 0.0 - 100.0 pg/mL  I-stat troponin, ED (not at [facility])  Result Value Ref Range   Troponin i, poc 0.00 0.00 - 0.08 ng/mL   Comment 3           Dg Chest Port 1 View  01/02/2015   CLINICAL DATA:  Chest pain for 1 day  EXAM: PORTABLE CHEST - 1 VIEW  COMPARISON:  02/14/2013  FINDINGS: A defibrillator is again seen. The cardiac shadow is stable. Postoperative changes are noted. The lungs are clear bilaterally. No bony abnormality is seen.  IMPRESSION: No acute abnormality noted.   Electronically Signed   By: Inez Catalina M.D.   On: 01/02/2015 17:57   Chest xray viewed by me MDM  Dr. Meda Coffee consult from cardiology who will arrange for admission Diagnosis #1unstable angina #2 hyperglycemia #3 anemia Final diagnoses:  None    CRITICAL CARE Performed by: Orlie Dakin Total critical care time: 30 minute Critical care time was exclusive of separately billable procedures and treating other patients. Critical care was necessary to treat or prevent imminent or life-threatening deterioration. Critical care was time spent personally by me on the following activities: development of treatment plan with patient and/or surrogate as well as nursing, discussions with consultants, evaluation of patient's response to treatment, examination of patient, obtaining history from patient or surrogate, ordering and performing treatments and interventions, ordering and review of laboratory studies, ordering and  review of radiographic studies, pulse oximetry and re-evaluation of patient's condition.    Orlie Dakin, MD 01/02/15 1910

## 2015-01-02 NOTE — ED Notes (Signed)
Report attempt x 1 

## 2015-01-02 NOTE — Progress Notes (Signed)
ANTICOAGULATION CONSULT NOTE - Initial Consult  Pharmacy Consult for heparin Indication: chest pain/ACS  Allergies  Allergen Reactions  . Codeine Anaphylaxis  . Digoxin And Related     Unknown  . Diltiazem     Unknown  . Latex     Latex tape  . Metolazone     Unknown  . Morphine Sulfate     Unknown  . Myrbetriq [Mirabegron]     Causes headaches  . Onglyza [Saxagliptin]     Causes migraines  . Penicillins     Unknown  . Septra Ds [Sulfamethoxazole W/Trimethoprim (Co-Trimoxazole)]     Unknown  . Spironolactone     Unknown  . Sulfa Antibiotics Other (See Comments)  . Sulfa Drugs Cross Reactors     Unknown  . Rema Fendt [Diltiazem Hcl]     Unknown  . Tetracyclines & Related     Unknown  . Ticlid [Ticlopidine Hcl]     Unknown  . Verapamil     Unknown  . Vicodin [Hydrocodone-Acetaminophen]     Unknown    Patient Measurements: Height: 5' 4.5" (163.8 cm) Weight: 173 lb (78.472 kg) IBW/kg (Calculated) : 55.85 Heparin Dosing Weight: 72.4kg  Vital Signs: Temp: 98 F (36.7 C) (02/25 1711) Temp Source: Oral (02/25 1711) BP: 118/75 mmHg (02/25 2100) Pulse Rate: 77 (02/25 2030)  Labs:  Recent Labs  01/02/15 1717  HGB 10.4*  HCT 31.2*  PLT 182  CREATININE 1.57*    Estimated Creatinine Clearance: 38.6 mL/min (by C-G formula based on Cr of 1.57).   Medical History: Past Medical History  Diagnosis Date  . Ischemic cardiomyopathy     status post biventricular ICD placed by DR Edumunds who used to see Dr Melvern Banker here to establish  cardiovascular care.  . Hypertension   . Coronary artery disease   . CHF (congestive heart failure)   . MVP (mitral valve prolapse)     Antibiotics not required for procedures  . Asthma   . Anxiety   . Depression   . Hiatal hernia   . Cervical dysplasia   . Fibroid   . Function kidney decreased   . History of shingles   . Chronic kidney disease   . ICD (implantable cardiac defibrillator) in place   . Pacemaker   .  Complication of anesthesia     "I wake up during surgeries" (02/14/2013)  . Anginal pain   . Myocardial infarction     "I've had 2; the others they were able to catch before completing" (02/14/2013)  . Pneumonia 1950's & 1985  . Shortness of breath     "lying down flat; at times w/exertion" (02/14/2013)  . Type II diabetes mellitus   . Iron deficiency anemia   . GERD (gastroesophageal reflux disease)   . History of stomach ulcers   . Migraines   . Stroke     "2 confirmed; 9 TIA's; results in dragging LLE; numbness in tip of tongue" (02/14/2013)    Medications:  Prescriptions prior to admission  Medication Sig Dispense Refill Last Dose  . albuterol (PROVENTIL HFA;VENTOLIN HFA) 108 (90 BASE) MCG/ACT inhaler Inhale 2 puffs into the lungs every 4 (four) hours as needed for wheezing or shortness of breath.   Past Month at Unknown time  . ALPRAZolam (XANAX) 1 MG tablet Take 0.5-1 mg by mouth 3 (three) times daily as needed (1 tab in the AM, 0.5 tab at noon and 0.5 tab in the PM).    01/02/2015 at Unknown time  .  Artificial Tear GEL Apply 2 drops to eye daily as needed (dry eyes). \   01/01/2015 at Unknown time  . aspirin 325 MG EC tablet Take 325 mg by mouth daily.     01/02/2015 at 8a   . BIOTIN PO Take 10,000 mcg by mouth daily. Biotin plus keratin   01/02/2015 at Unknown time  . cetirizine (ZYRTEC) 10 MG tablet Take 10 mg by mouth daily.   01/02/2015 at Unknown time  . Cholecalciferol (VITAMIN D-3) 1000 UNITS CAPS Take 1 capsule by mouth daily.   01/02/2015 at Unknown time  . clopidogrel (PLAVIX) 75 MG tablet Take 1 tablet by mouth  daily 90 tablet 0 01/02/2015 at Unknown time  . [facility] PO Take 40 mg by mouth daily. Take 2 tabs daily 80 mg  total   01/02/2015 at Unknown time  . doxazosin (CARDURA) 4 MG tablet Take 4 mg by mouth daily.   01/01/2015 at Unknown time  . ezetimibe (ZETIA) 10 MG tablet Take 1 tablet by mouth  daily 90 tablet 3 01/02/2015 at Unknown time  . fluticasone (FLONASE) 50  MCG/ACT nasal spray Place 2 sprays into the nose daily as needed for allergies or rhinitis.    01/01/2015 at Unknown time  . Fluticasone-Salmeterol (ADVAIR) 100-50 MCG/DOSE AEPB Inhale 1 puff into the lungs daily as needed (shortness or breath or wheezing).    01/01/2015 at Unknown time  . furosemide (LASIX) 80 MG tablet Take 1 tablet by mouth two  times daily 180 tablet 0 01/02/2015 at Unknown time  . insulin glargine (LANTUS) 100 UNIT/ML injection Inject 18 Units into the skin at bedtime.    01/01/2015 at Unknown time  . isosorbide mononitrate (IMDUR) 30 MG 24 hr tablet Take 1 tablet by mouth  daily 90 tablet 1 01/02/2015 at Unknown time  . metoprolol (LOPRESSOR) 100 MG tablet Take 1 tablet (100 mg total) by mouth 2 (two) times daily. 180 tablet 3 01/02/2015 at 8a  . Multiple Vitamin (MULTIVITAMIN WITH MINERALS) TABS Take 1 tablet by mouth daily.   01/02/2015 at Unknown time  . NITROSTAT 0.4 MG SL tablet Place 1 tablet under the tongue every 5 minutes as needed for chest pain, max 3 doses, go to er if no relief 25 tablet 2 01/02/2015 at Unknown time  . ondansetron (ZOFRAN) 8 MG tablet Take 8 mg by mouth every 8 (eight) hours as needed. For nausea   Past Month at Unknown time  . pantoprazole (PROTONIX) 40 MG tablet Take 40 mg by mouth daily.   01/02/2015 at Unknown time  . potassium chloride (K-DUR) 10 MEQ tablet Take 2 tablets (20 mEq total) by mouth 2 (two) times daily. 360 tablet 1 01/02/2015 at Unknown time  . ramipril (ALTACE) 10 MG capsule Take 1 capsule by mouth  twice daily 180 capsule 0 01/02/2015 at Unknown time  . simvastatin (ZOCOR) 20 MG tablet Take 1 tablet (20 mg total) by mouth at bedtime. 1 tab daily 90 tablet 3 01/01/2015 at Unknown time  . sodium chloride (MURO 128) 5 % ophthalmic ointment Place 1 drop into both eyes daily as needed. For dry eyes   01/01/2015 at Unknown time  . traMADol (ULTRAM) 50 MG tablet Take 100 mg by mouth every 6 (six) hours as needed (pain). For pain   Past Week at  Unknown time  . traZODone (DESYREL) 50 MG tablet Take 25 mg by mouth at bedtime as needed for sleep.   Past Month at Unknown time  .  venlafaxine (EFFEXOR) 100 MG tablet Take 2 tablets by mouth in the am 1 tablet by mouth in the pm   01/02/2015 at Unknown time   Scheduled:  . aspirin  324 mg Oral NOW   Or  . aspirin  300 mg Rectal NOW  . [START ON 01/03/2015] aspirin  81 mg Oral Pre-Cath  . [START ON 01/03/2015] aspirin EC  81 mg Oral Daily  . [START ON 01/03/2015] atorvastatin  80 mg Oral q1800  . clopidogrel  75 mg Oral Daily  . doxazosin  4 mg Oral Daily  . ezetimibe  10 mg Oral Daily  . metoprolol  100 mg Oral BID  . mometasone-formoterol  2 puff Inhalation BID  . pantoprazole  40 mg Oral Daily  . sodium chloride  3 mL Intravenous Q12H  . sodium chloride  3 mL Intravenous Q12H  . [START ON 01/03/2015] venlafaxine  100 mg Oral BID WC    Assessment: 64 yo female here with CP and noted with history of CAD w/ CABG. Pharmay has been consulted to dose heparin.   Goal of Therapy:  Heparin level 0.3-0.7 units/ml Monitor platelets by anticoagulation protocol: Yes   Plan:  -Heparin bolus 4000 units IV followed by 1000 units/hr (~14 units/kg/hr) -Heparin level in 8 hours and daily wth CBC daily  Hildred Laser, Pharm D 01/02/2015 10:04 PM

## 2015-01-03 ENCOUNTER — Encounter (HOSPITAL_COMMUNITY)
Admission: EM | Disposition: A | Discharge status: Home / Self Care | Payer: Self-pay | Source: Home / Self Care | Attending: Cardiology

## 2015-01-03 ENCOUNTER — Encounter (HOSPITAL_COMMUNITY): Payer: Self-pay | Admitting: Interventional Cardiology

## 2015-01-03 DIAGNOSIS — I2582 Chronic total occlusion of coronary artery: Secondary | ICD-10-CM | POA: Diagnosis not present

## 2015-01-03 DIAGNOSIS — I25110 Atherosclerotic heart disease of native coronary artery with unstable angina pectoris: Secondary | ICD-10-CM | POA: Diagnosis not present

## 2015-01-03 DIAGNOSIS — I5022 Chronic systolic (congestive) heart failure: Secondary | ICD-10-CM | POA: Diagnosis not present

## 2015-01-03 DIAGNOSIS — I2510 Atherosclerotic heart disease of native coronary artery without angina pectoris: Secondary | ICD-10-CM

## 2015-01-03 DIAGNOSIS — I255 Ischemic cardiomyopathy: Secondary | ICD-10-CM | POA: Diagnosis not present

## 2015-01-03 DIAGNOSIS — R079 Chest pain, unspecified: Secondary | ICD-10-CM | POA: Diagnosis present

## 2015-01-03 DIAGNOSIS — R7989 Other specified abnormal findings of blood chemistry: Secondary | ICD-10-CM | POA: Diagnosis present

## 2015-01-03 DIAGNOSIS — R778 Other specified abnormalities of plasma proteins: Secondary | ICD-10-CM | POA: Diagnosis present

## 2015-01-03 HISTORY — PX: LEFT HEART CATHETERIZATION WITH CORONARY/GRAFT ANGIOGRAM: SHX5450

## 2015-01-03 LAB — CBC
HCT: 31.4 % — ABNORMAL LOW (ref 36.0–46.0)
Hemoglobin: 10.4 g/dL — ABNORMAL LOW (ref 12.0–15.0)
MCH: 29.4 pg (ref 26.0–34.0)
MCHC: 33.1 g/dL (ref 30.0–36.0)
MCV: 88.7 fL (ref 78.0–100.0)
Platelets: 179 10*3/uL (ref 150–400)
RBC: 3.54 MIL/uL — ABNORMAL LOW (ref 3.87–5.11)
RDW: 12.9 % (ref 11.5–15.5)
WBC: 6.2 10*3/uL (ref 4.0–10.5)

## 2015-01-03 LAB — COMPREHENSIVE METABOLIC PANEL
ALK PHOS: 54 U/L (ref 39–117)
ALT: 24 U/L (ref 0–35)
AST: 29 U/L (ref 0–37)
Albumin: 3.8 g/dL (ref 3.5–5.2)
Anion gap: 10 (ref 5–15)
BILIRUBIN TOTAL: 0.5 mg/dL (ref 0.3–1.2)
BUN: 11 mg/dL (ref 6–23)
CHLORIDE: 102 mmol/L (ref 96–112)
CO2: 29 mmol/L (ref 19–32)
Calcium: 8.9 mg/dL (ref 8.4–10.5)
Creatinine, Ser: 1.5 mg/dL — ABNORMAL HIGH (ref 0.50–1.10)
GFR, EST AFRICAN AMERICAN: 42 mL/min — AB (ref >90)
GFR, EST NON AFRICAN AMERICAN: 36 mL/min — AB (ref >90)
GLUCOSE: 127 mg/dL — AB (ref 70–99)
Potassium: 3.2 mmol/L — ABNORMAL LOW (ref 3.5–5.1)
Sodium: 141 mmol/L (ref 135–145)
Total Protein: 6.7 g/dL (ref 6.0–8.3)

## 2015-01-03 LAB — GLUCOSE, CAPILLARY
Glucose-Capillary: 107 mg/dL — ABNORMAL HIGH (ref 70–99)
Glucose-Capillary: 114 mg/dL — ABNORMAL HIGH (ref 70–99)
Glucose-Capillary: 184 mg/dL — ABNORMAL HIGH (ref 70–99)
Glucose-Capillary: 88 mg/dL (ref 70–99)

## 2015-01-03 LAB — LIPID PANEL
Cholesterol: 144 mg/dL (ref 0–200)
HDL: 54 mg/dL (ref >39)
LDL Cholesterol: 70 mg/dL (ref 0–99)
TRIGLYCERIDES: 101 mg/dL (ref <150)
Total CHOL/HDL Ratio: 2.7 RATIO
VLDL: 20 mg/dL (ref 0–40)

## 2015-01-03 LAB — PROTIME-INR
INR: 1.13 (ref 0.00–1.49)
INR: 1.08 (ref 0.00–1.49)
PROTHROMBIN TIME: 14.6 s (ref 11.6–15.2)
Prothrombin Time: 14.1 seconds (ref 11.6–15.2)

## 2015-01-03 LAB — TROPONIN I
Troponin I: 0.23 ng/mL — ABNORMAL HIGH (ref <0.031)
Troponin I: <0.03 ng/mL (ref <0.031)
Troponin I: <0.03 ng/mL (ref <0.031)

## 2015-01-03 LAB — TSH: TSH: 1.353 u[IU]/mL (ref 0.350–4.500)

## 2015-01-03 LAB — HEPARIN LEVEL (UNFRACTIONATED): Heparin Unfractionated: 0.92 IU/mL — ABNORMAL HIGH (ref 0.30–0.70)

## 2015-01-03 SURGERY — LEFT HEART CATHETERIZATION WITH CORONARY/GRAFT ANGIOGRAM
Anesthesia: LOCAL

## 2015-01-03 MED ORDER — HEPARIN (PORCINE) IN NACL 2-0.9 UNIT/ML-% IJ SOLN
INTRAMUSCULAR | Status: AC
  Filled 2015-01-03: qty 1000

## 2015-01-03 MED ORDER — ONDANSETRON HCL 4 MG/2ML IJ SOLN: 4.0000 mg | Freq: Four times a day (QID) | INTRAMUSCULAR | Status: DC | PRN

## 2015-01-03 MED ORDER — LIDOCAINE HCL (PF) 1 % IJ SOLN
INTRAMUSCULAR | Status: AC
  Filled 2015-01-03: qty 30

## 2015-01-03 MED ORDER — SODIUM CHLORIDE 0.9 % IV SOLN
INTRAVENOUS | Status: AC
  Administered 2015-01-03: 11:00:00 via INTRAVENOUS

## 2015-01-03 MED ORDER — FENTANYL CITRATE 0.05 MG/ML IJ SOLN
INTRAMUSCULAR | Status: AC
  Filled 2015-01-03: qty 2

## 2015-01-03 MED ORDER — POTASSIUM CHLORIDE CRYS ER 20 MEQ PO TBCR
40.0000 meq | EXTENDED_RELEASE_TABLET | Freq: Once | ORAL | Status: AC
  Administered 2015-01-03: 40 meq via ORAL
  Filled 2015-01-03: qty 2

## 2015-01-03 MED ORDER — ENOXAPARIN SODIUM 30 MG/0.3ML ~~LOC~~ SOLN
30.0000 mg | SUBCUTANEOUS | Status: DC
  Filled 2015-01-03: qty 0.3

## 2015-01-03 MED ORDER — NITROGLYCERIN 1 MG/10 ML FOR IR/CATH LAB
INTRA_ARTERIAL | Status: AC
  Filled 2015-01-03: qty 10

## 2015-01-03 MED ORDER — POTASSIUM CHLORIDE CRYS ER 20 MEQ PO TBCR: 40.0000 meq | EXTENDED_RELEASE_TABLET | Freq: Once | ORAL | Status: DC

## 2015-01-03 MED ORDER — MIDAZOLAM HCL 2 MG/2ML IJ SOLN
INTRAMUSCULAR | Status: AC
  Filled 2015-01-03: qty 2

## 2015-01-03 NOTE — Progress Notes (Signed)
UR completed 

## 2015-01-03 NOTE — CV Procedure (Signed)
     Left Heart Catheterization with Coronary and Bypass Graft Angiography Report  Syniya Cui Ebling  64 y.o.  female 1950-12-10  Procedure Date: 01/03/2015 Referring Physician: Ena Dawley, M.D. Primary Cardiologist: HWB Blenda Bridegroom, M.D.  INDICATIONS: Prolonged chest discomfort, waxing and waning in duration with total discomfort proximally 2 hours. Mild elevation in troponin. Clinical concern for unstable angina.  PROCEDURE: 1. Left heart catheterization; 2. Coronary angiography; 3. Left ventriculography; 4. Bypass graft angiography  CONSENT:  The risks, benefits, and details of the procedure were explained in detail to the patient. Risks including death, stroke, heart attack, kidney injury, allergy, limb ischemia, bleeding and radiation injury were discussed.  The patient verbalized understanding and wanted to proceed.  Informed written consent was obtained.  PROCEDURE TECHNIQUE:  After Xylocaine anesthesia a 5 French sheath was placed in the right femoral artery using the modified Seldinger technique.  Coronary angiography was done using a 5 F A2 MP and 5 [place] IMA diagnostic catheters.  Left ventriculography was done using the A2 MP catheter and hand injection.   After review of the digital images the case was terminated.  A Vascade closure device was used to achieve hemostasis without complications.   CONTRAST:  Total of 100 cc.  COMPLICATIONS:  None   HEMODYNAMICS:  Aortic pressure 134/73 mmHg; LV pressure 134/13 mmHg; LVEDP 18 mmHg  ANGIOGRAPHIC DATA:   The left main coronary artery is patent without any significant obstruction.  The left anterior descending artery is diffusely diseased in the proximal and mid segment. Competitive flow is noted in the mid and distal LAD. First agonal is severely diseased..  The left circumflex artery is patent. 4 obtuse marginal branches are noted. The distal circumflex before the fourth marginal is diffusely disease. Otherwise  luminal irregularities are noted throughout..  The right coronary artery is totally occluded in the mid vessel.[name]  BYPASS GRAFT ANGIOGRAPHY: Saphenous vein graft to the PDA is widely patent.  Saphenous vein graft diagonal is widely patent. Luminal irregularities are noted in the 2 branches of the diagonal beyond the vein graft.  LIMA to LAD is widely patent.   LEFT VENTRICULOGRAM:  Left ventricular angiogram was done in the 30 RAO projection and revealed a relatively normal size cavity. Abnormal contractile pattern is noted. EF is estimated to be 35%. No significant mitral regurgitation is seen.   IMPRESSIONS:  1. Widely patent bypass grafts including saphenous vein grafts to the diagonal and RCA. LIMA to the LAD is widely patent. 2. Totally occluded mid RCA, totally occluded diagonal #1, and diffuse disease in the mid LAD. 3. Decreased left ventricular function with LVEF in the 35-40% range with dysynergy noted.   RECOMMENDATION:    Suspect microvascular vasoconstriction as the source of angina. Continue the current medical regimen.[name]

## 2015-01-03 NOTE — Progress Notes (Signed)
Elevated troponin 0.23, reported to on call cardiologist via text page. Patient asymptomatic, VSS, will continue to monitor closely.

## 2015-01-03 NOTE — Interval H&P Note (Signed)
Cath Lab Visit (complete for each Cath Lab visit)  Clinical Evaluation Leading to the Procedure:   ACS: Yes.    Non-ACS:    Anginal Classification: CCS III  Anti-ischemic medical therapy: Maximal Therapy (2 or more classes of medications)  Non-Invasive Test Results: No non-invasive testing performed  Prior CABG: Previous CABG      History and Physical Interval Note:  01/03/2015 9:28 AM  Troy T Roldan  has presented today for surgery, with the diagnosis of usntable angina  The various methods of treatment have been discussed with the patient and family. After consideration of risks, benefits and other options for treatment, the patient has consented to  Procedure(s): LEFT HEART CATHETERIZATION WITH CORONARY/GRAFT ANGIOGRAM (N/A) as a surgical intervention .  The patient's history has been reviewed, patient examined, no change in status, stable for surgery.  I have reviewed the patient's chart and labs.  Questions were answered to the patient's satisfaction.     Sinclair Grooms

## 2015-01-03 NOTE — H&P (View-Only) (Signed)
CARDIOLOGY CONSULT NOTE   Patient ID: [name] MRN: [id number], DOB/AGE: 07-20-1951   Admit date: 01/02/2015 Date of Consult: 01/02/2015  Primary Physician: Reginia Naas, MD Primary Cardiologist: Dr Smith/Dr Lovena Le  Reason for consult:  Chest pain  Problem List  Past Medical History  Diagnosis Date  . Ischemic cardiomyopathy     status post biventricular ICD placed by DR Edumunds who used to see Dr Melvern Banker here to establish  cardiovascular care.  . Hypertension   . Coronary artery disease   . CHF (congestive heart failure)   . MVP (mitral valve prolapse)     Antibiotics not required for procedures  . Asthma   . Anxiety   . Depression   . Hiatal hernia   . Cervical dysplasia   . Fibroid   . Function kidney decreased   . History of shingles   . Chronic kidney disease   . ICD (implantable cardiac defibrillator) in place   . Pacemaker   . Complication of anesthesia     "I wake up during surgeries" (02/14/2013)  . Anginal pain   . Myocardial infarction     "I've had 2; the others they were able to catch before completing" (02/14/2013)  . Pneumonia 1950's & 1985  . Shortness of breath     "lying down flat; at times w/exertion" (02/14/2013)  . Type II diabetes mellitus   . Iron deficiency anemia   . GERD (gastroesophageal reflux disease)   . History of stomach ulcers   . Migraines   . Stroke     "2 confirmed; 9 TIA's; results in dragging LLE; numbness in tip of tongue" (02/14/2013)    Past Surgical History  Procedure Laterality Date  . Insert / replace / remove pacemaker      biventricular defibrillator--06/10/ 2009  . Abdominal hysterectomy  1985    TAH   . Cardiac defibrillator placement    . Coronary angioplasty with stent placement      "started out w/5; bypass corrected some; 1 stent since the bypass" (02/14/2013)  . Breast excisional biopsy Left 01/2007; 06/2007; 03/2008    "benign" (02/14/2013)  . Cardiac catheterization      "probably in the teens"  (02/14/2013)  . Colonoscopy  ~ 2002  . Tee without cardioversion  11/14/2012    Procedure: TRANSESOPHAGEAL ECHOCARDIOGRAM (TEE);  Surgeon: Candee Furbish, MD;  Location: [facility] ENDOSCOPY;  Service: Cardiovascular;  Laterality: N/A;  . Supraventricular tachycardia ablation  06/2007  . Biv icd genertaor change out  06/2007; 04/2008    "2 lead initial placement, at [facility]; done at [facility], after developing CHF" (02/14/2013)  . Coronary artery bypass graft  ` 1998    "CABG X3" (02/14/2013)  . Breast surgery    . Dilation and curettage of uterus  1975 X 2; 1976; 1977    Allergies  Allergies  Allergen Reactions  . Codeine Anaphylaxis  . Digoxin And Related     Unknown  . Diltiazem     Unknown  . Latex     Latex tape  . Metolazone     Unknown  . Morphine Sulfate     Unknown  . Myrbetriq [Mirabegron]     Causes headaches  . Onglyza [Saxagliptin]     Causes migraines  . Penicillins     Unknown  . Septra Ds [Sulfamethoxazole W/Trimethoprim (Co-Trimoxazole)]     Unknown  . Spironolactone     Unknown  . Sulfa Antibiotics Other (See Comments)  . Sulfa Drugs Cross  Reactors     Unknown  . Rema Fendt [Diltiazem Hcl]     Unknown  . Tetracyclines & Related     Unknown  . Ticlid [Ticlopidine Hcl]     Unknown  . Verapamil     Unknown  . Vicodin [Hydrocodone-Acetaminophen]     Unknown    HPI   [name] is a 64 y.o. female with known CAD, s/p CABG in 3 in 07/2013, ischemic CMP, s/p BiV ICD, last LVEF 40-45% on echo in 2014,  followed by Dr Tamala Julian, last seen in January 2016 when she was doing relatively well. She was seen by Dr Lovena Le and was found to reach ICD ERI and is scheduled for a generator change. Stable NYHA II.  She mentioned an episode of chest pain while in the office and was instructed to come to the ER if another occurs.  Today she presented with a typical retrosternal chest pain that has resolved with NTG, she had another brief episode of pain in the ER that resolved on its  own.  Inpatient Medications   . nitroGLYCERIN 15 mcg/min (01/02/15 1848)     Family History Family History  Problem Relation Age of Onset  . Heart disease Mother   . Hypertension Mother   . Heart disease Father   . Hypertension Father   . Diabetes Father   . Kidney failure Father   . Heart disease Brother   . Diabetes Brother   . Kidney failure Brother   . Diabetes Paternal Grandmother   . Heart attack Mother   . Heart attack Father   . Heart attack Brother   . Stroke Mother   . Stroke Father      Social History History   Social History  . Marital Status: Married    Spouse Name: [name]  . Number of Children: 1  . Years of Education: MA   Occupational History  .      Disablity   Social History Main Topics  . Smoking status: Never Smoker   . Smokeless tobacco: Never Used  . Alcohol Use: Yes     Comment: 02/14/2013 "glass of wine q blue moon/vacation"  . Drug Use: No  . Sexual Activity: Yes    Birth Control/ Protection: Surgical   Other Topics Concern  . Not on file   Social History Narrative   Patient lives at home with spouse.     Daughter name is Tourist information centre manager.   Caffeine Use: none     Review of Systems  General:  No chills, fever, night sweats or weight changes.  Cardiovascular:  No chest pain, dyspnea on exertion, edema, orthopnea, palpitations, paroxysmal nocturnal dyspnea. Dermatological: No rash, lesions/masses Respiratory: No cough, dyspnea Urologic: No hematuria, dysuria Abdominal:   No nausea, vomiting, diarrhea, bright red blood per rectum, melena, or hematemesis Neurologic:  No visual changes, wkns, changes in mental status. All other systems reviewed and are otherwise negative except as noted above.  Physical Exam  Blood pressure 119/76, pulse 78, temperature 98 F (36.7 C), temperature source Oral, resp. rate 18, height 5' 4.5" (1.638 m), weight 173 lb (78.472 kg), SpO2 96 %.  General: Pleasant, NAD Psych: Normal affect. Neuro: Alert and  oriented X 3. Moves all extremities spontaneously. HEENT: Normal  Neck: Supple without bruits or JVD. Lungs:  Resp regular and unlabored, CTA. Heart: RRR no s3, s4, or murmurs. Abdomen: Soft, non-tender, non-distended, BS + x 4.  Extremities: No clubbing, cyanosis or edema. DP/PT/Radials 2+ and  equal bilaterally.  Labs  No results for input(s): CKTOTAL, CKMB, TROPONINI in the last 72 hours. Lab Results  Component Value Date   WBC 5.6 01/02/2015   HGB 10.4* 01/02/2015   HCT 31.2* 01/02/2015   MCV 88.6 01/02/2015   PLT 182 01/02/2015    Recent Labs Lab 01/02/15 1717  NA 140  K 3.6  CL 104  CO2 27  BUN 9  CREATININE 1.57*  CALCIUM 9.3  GLUCOSE 160*   Lab Results  Component Value Date   CHOL 178 02/15/2013   HDL 54 02/15/2013   LDLCALC 94 02/15/2013   TRIG 152* 02/15/2013   Lab Results  Component Value Date   DDIMER * 12/07/2007    0.54        AT THE INHOUSE ESTABLISHED CUTOFF VALUE OF 0.48 ug/mL FEU, THIS ASSAY HAS BEEN DOCUMENTED IN THE LITERATURE TO HAVE   Invalid input(s): POCBNP  Radiology/Studies  Dg Chest Port 1 View  01/02/2015   CLINICAL DATA:  Chest pain for 1 day  EXAM: PORTABLE CHEST - 1 VIEW  COMPARISON:  02/14/2013  FINDINGS: A defibrillator is again seen. The cardiac shadow is stable. Postoperative changes are noted. The lungs are clear bilaterally. No bony abnormality is seen.  IMPRESSION: No acute abnormality noted.   Electronically Signed   By: Inez Catalina M.D.   On: 01/02/2015 17:57   Echocardiogram  - 11/2012 Left ventricle: The cavity size was normal. Systolic function was mildly to moderately reduced. The estimated ejection fraction was in the range of 40% to 45%. Diffuse hypokinesis. Features are consistent with a pseudonormal left ventricular filling pattern, with concomitant abnormal relaxation and increased filling pressure (grade 2 diastolic dysfunction). Doppler parameters are consistent with high ventricular filling  pressure. - Mitral valve: Mild regurgitation. Impressions:  - No cardiac source of embolism was identified, but cannot be ruled out on the basis of this examination  ECG:    ASSESSMENT AND PLAN  1. Unstable angina 2. Coronary artery disease with prior coronary bypass grafting 2. Chronic systolic heart failure, functional class II 3. ASCVD, biventricular 4. Essential hypertension 5. Chronic kidney disease, stage III  We will start Heparin drip, continue NTG drip, her first troponin is negative, ECG shows a-sensed, V paced rhythm with no acute changes.  We will keep NPO for a cath in the am. Continue aspirin 81 mg po daily, metoprolol 100 mg po BID, start atorvastatin 80 mg po daily. We will hold ramipril as she has CKD stage III and is going for a cath. Today's Crea 1.5, baseline 1.5 -1.7. Restart afterwards.   Signed, Dorothy Spark, MD, [facility] 01/02/2015, 7:33 PM

## 2015-01-03 NOTE — Care Management Note (Addendum)
    Page 1 of 1   01/03/2015     2:34:38 PM CARE MANAGEMENT NOTE 01/03/2015  Patient:  [name], [name]   Account Number:  [id number]  Date Initiated:  01/03/2015  Documentation initiated by:  GRAVES-BIGELOW,Maylani Embree  Subjective/Objective Assessment:   Pt admitted for Unstable angina.     Action/Plan:   No needs from CM at this time.   Anticipated DC Date:  01/04/2015   Anticipated DC Plan:  [place]  CM consult      Choice offered to / List presented to:             Status of service:  Completed, signed off Medicare Important Message given?  YES (If response is "NO", the following Medicare IM given date fields will be blank) Date Medicare IM given:  01/03/2015 Medicare IM given by:  GRAVES-BIGELOW,Jaydien [place] Date Additional Medicare IM given:   Additional Medicare IM given by:    Discharge Disposition:  HOME/SELF CARE  Per UR Regulation:  Reviewed for med. necessity/level of care/duration of stay  If discussed at [place] of Stay Meetings, dates discussed:    Comments:

## 2015-01-04 ENCOUNTER — Encounter (HOSPITAL_COMMUNITY): Payer: Self-pay | Admitting: Physician Assistant

## 2015-01-04 DIAGNOSIS — R0789 Other chest pain: Secondary | ICD-10-CM | POA: Diagnosis not present

## 2015-01-04 DIAGNOSIS — I25700 Atherosclerosis of coronary artery bypass graft(s), unspecified, with unstable angina pectoris: Secondary | ICD-10-CM | POA: Diagnosis not present

## 2015-01-04 DIAGNOSIS — R7989 Other specified abnormal findings of blood chemistry: Secondary | ICD-10-CM | POA: Diagnosis not present

## 2015-01-04 DIAGNOSIS — I200 Unstable angina: Secondary | ICD-10-CM | POA: Diagnosis not present

## 2015-01-04 LAB — CBC
HCT: 31 % — ABNORMAL LOW (ref 36.0–46.0)
Hemoglobin: 10.3 g/dL — ABNORMAL LOW (ref 12.0–15.0)
MCH: 30.1 pg (ref 26.0–34.0)
MCHC: 33.2 g/dL (ref 30.0–36.0)
MCV: 90.6 fL (ref 78.0–100.0)
PLATELETS: 173 10*3/uL (ref 150–400)
RBC: 3.42 MIL/uL — AB (ref 3.87–5.11)
RDW: 13.1 % (ref 11.5–15.5)
WBC: 5.8 10*3/uL (ref 4.0–10.5)

## 2015-01-04 LAB — GLUCOSE, CAPILLARY
Glucose-Capillary: 150 mg/dL — ABNORMAL HIGH (ref 70–99)
Glucose-Capillary: 151 mg/dL — ABNORMAL HIGH (ref 70–99)

## 2015-01-04 LAB — HEMOGLOBIN A1C
Hgb A1c MFr Bld: 6.7 % — ABNORMAL HIGH (ref 4.8–5.6)
Mean Plasma Glucose: 146 mg/dL

## 2015-01-04 NOTE — Progress Notes (Signed)
    Subjective:  Feels great. No CP, no SOB  Objective:  Vital Signs in the last 24 hours: Temp:  [97.8 F (36.6 C)-98.2 F (36.8 C)] 98.2 F (36.8 C) (02/27 0545) Pulse Rate:  [70-80] 72 (02/27 0907) Resp:  [11-16] 16 (02/27 0545) BP: (126-143)/(65-77) 143/65 mmHg (02/27 0907) SpO2:  [94 %-100 %] 94 % (02/27 0545) Weight:  [172 lb 13.5 oz (78.4 kg)] 172 lb 13.5 oz (78.4 kg) (02/27 0545)  Intake/Output from previous day: 02/26 0701 - 02/27 0700 In: 240 [P.O.:240] Out: 0    Physical Exam: General: Well developed, well nourished, in no acute distress. Head:  Normocephalic and atraumatic. Lungs: Clear to auscultation and percussion. Heart: Normal S1 and S2.  No murmur, rubs or gallops.  Abdomen: soft, non-tender, positive bowel sounds.  Extremities: No clubbing or cyanosis. No edema. Cath site normal Neurologic: Alert and oriented x 3.    Lab Results:  Recent Labs  01/03/15 0304 01/04/15 0423  WBC 6.2 5.8  HGB 10.4* 10.3*  PLT 179 173    Recent Labs  01/02/15 1717 01/03/15 0304  NA 140 141  K 3.6 3.2*  CL 104 102  CO2 27 29  GLUCOSE 160* 127*  BUN 9 11  CREATININE 1.57* 1.50*    Recent Labs  01/03/15 0304 01/03/15 1200  TROPONINI <0.03 <0.03   Hepatic Function Panel  Recent Labs  01/03/15 0304  PROT 6.7  ALBUMIN 3.8  AST 29  ALT 24  ALKPHOS 54  BILITOT 0.5    Recent Labs  01/03/15 0304  CHOL 144     Imaging: Dg Chest Port 1 View  01/02/2015   CLINICAL DATA:  Chest pain for 1 day  EXAM: PORTABLE CHEST - 1 VIEW  COMPARISON:  02/14/2013  FINDINGS: A defibrillator is again seen. The cardiac shadow is stable. Postoperative changes are noted. The lungs are clear bilaterally. No bony abnormality is seen.  IMPRESSION: No acute abnormality noted.   Electronically Signed   By: Inez Catalina M.D.   On: 01/02/2015 17:57     Telemetry: V-paced Personally viewed.    Cardiac Studies:  Cath 01/03/15:    LEFT VENTRICULOGRAM: Left ventricular  angiogram was done in the 30 RAO projection and revealed a relatively normal size cavity. Abnormal contractile pattern is noted. EF is estimated to be 35%. No significant mitral regurgitation is seen.   IMPRESSIONS: 1. Widely patent bypass grafts including saphenous vein grafts to the diagonal and RCA. LIMA to the LAD is widely patent. 2. Totally occluded mid RCA, totally occluded diagonal #1, and diffuse disease in the mid LAD. 3. Decreased left ventricular function with LVEF in the 35-40% range with dysynergy noted.   RECOMMENDATION: Suspect microvascular vasoconstriction as the source of angina. Continue the current medical regimen..         Assessment/Plan:  Active Problems:   Chest pain   Elevated troponin   Unstable angina  64 year old with CAD post CABG admitted with unstable angina, elevated troponin (subsequent normal), with chronic systolic heart failure, pacemaker, HTN, CKD 3.  -DC home. Cath reassuring. Feels great.  -Continue current home med reg.  -Follow up with DR. Smith in 2 weeks.    [name], [place] 01/04/2015, 10:31 AM

## 2015-01-04 NOTE — Discharge Instructions (Signed)
PLEASE REMEMBER TO BRING ALL OF YOUR MEDICATIONS TO EACH OF YOUR FOLLOW-UP OFFICE VISITS. ° °PLEASE ATTEND ALL SCHEDULED FOLLOW-UP APPOINTMENTS.  ° °Activity: Increase activity slowly as tolerated. You may shower, but no soaking baths (or swimming) for 1 week. No driving for 2 days. No lifting over 5 lbs for 1 week. No sexual activity for 1 week.  ° °You May Return to Work: in 1 week (if applicable) ° °Wound Care: You may wash cath site gently with soap and water. Keep cath site clean and dry. If you notice pain, swelling, bleeding or pus at your cath site, please call [phone]. ° ° ° °Cardiac Cath Site Care °Refer to this sheet in the next few weeks. These instructions provide you with information on caring for yourself after your procedure. Your caregiver may also give you more specific instructions. Your treatment has been planned according to current medical practices, but problems sometimes occur. Call your caregiver if you have any problems or questions after your procedure. °HOME CARE INSTRUCTIONS °· You may shower 24 hours after the procedure. Remove the bandage (dressing) and gently wash the site with plain soap and water. Gently pat the site dry.  °· Do not apply powder or lotion to the site.  °· Do not sit in a bathtub, swimming pool, or whirlpool for 5 to 7 days.  °· No bending, squatting, or lifting anything over 10 pounds (4.5 kg) as directed by your caregiver.  °· Inspect the site at least twice daily.  °· Do not drive home if you are discharged the same day of the procedure. Have someone else drive you.  °· You may drive 24 hours after the procedure unless otherwise instructed by your caregiver.  °What to expect: °· Any bruising will usually fade within 1 to 2 weeks.  °· Blood that collects in the tissue (hematoma) may be painful to the touch. It should usually decrease in size and tenderness within 1 to 2 weeks.  °SEEK IMMEDIATE MEDICAL CARE IF: °· You have unusual pain at the site or down the  affected limb.  °· You have redness, warmth, swelling, or pain at the site.  °· You have drainage (other than a small amount of blood on the dressing).  °· You have chills.  °· You have a fever or persistent symptoms for more than 72 hours.  °· You have a fever and your symptoms suddenly get worse.  °· Your leg becomes pale, cool, tingly, or numb.  °· You have heavy bleeding from the site. Hold pressure on the site.  °Document Released: 11/27/2010 Document Revised: 10/14/2011 Document Reviewed: 11/27/2010 °[employer]® Patient Information ©2012 [employer]. ° °

## 2015-01-04 NOTE — Discharge Summary (Signed)
CARDIOLOGY DISCHARGE SUMMARY   Patient ID: TOKIKO CASTELAN MRN: [id number] DOB/AGE: 62-Mar-1952 62 y.o.  Admit date: 01/02/2015 Discharge date: 01/05/2015  PCP: Reginia Naas, MD Primary Cardiologist: Dr. Tamala Julian Electrophysiologist: Dr. Lovena Le  Primary Discharge Diagnosis:  Unstable anginal pain, medical therapy recommended for small vessel disease  Secondary Discharge Diagnosis:    Biventricular implantable cardioverter-defibrillator in situ   Chronic systolic heart failure-EF 40% 2014   H/O CABG 1998, multiple caths, cath 01/03/15 showed patent grafts   Anxiety   Depression   CVA (cerebral infarction)   HTN (hypertension)   DM (diabetes mellitus)   Chronic renal insufficiency, stage III (moderate)   Elevated troponin   Anemia   Hypokalemia  Procedures: 1. Left heart catheterization; 2. Coronary angiography; 3. Left ventriculography; 4. Bypass graft angiography  Hospital Course: [name] is a 64 y.o. female with a history of CAD. She had bypass surgery in 1998, and was previously followed by Dr. Leonia Reeves and Dr. Melvern Banker. Her device is followed by Dr. Lovena Le. She mentions an episode of chest pain when she was in the office seeing Dr. Lovena Le and was told to come to the ER for another one occurred. She had recurrent chest pain and came to the emergency room where she was admitted for further evaluation and treatment.  Her initial troponin was elevated but subsequent troponins were negative. It was felt that she needed cardiac catheterization to definitively evaluate her. This was performed on 01/03/2015.  Cardiac catheterization results are below. Her EF was 35%, consistent with previous values. She had severe native three-vessel coronary artery disease. Her bypass grafts were patent. It was felt that her symptoms were likely from small vessel disease and medical therapy was recommended.  On 01/04/2015, she was seen by Dr. Marlou Porch and all data were reviewed.  She has a history of renal insufficiency but her BUN and creatinine were at or below previous values in this can be followed as an outpatient. She is anemic but this is only mildly different from lab values from a year ago and can also be followed as an outpatient.  Her potassium had been low and was supplemented.  She was ambulating without chest pain or shortness of breath. No further inpatient workup is indicated and she is considered stable for discharge, to follow up as an outpatient.  She will follow-up with Dr. Tamala Julian for the anemia. The full strength aspirin plus Plavix is what she has been on to prevent TIAs. She wishes to remain on this.  Labs:   Lab Results  Component Value Date   WBC 5.8 01/04/2015   HGB 10.3* 01/04/2015   HCT 31.0* 01/04/2015   MCV 90.6 01/04/2015   PLT 173 01/04/2015     Recent Labs Lab 01/03/15 0304  NA 141  K 3.2*  CL 102  CO2 29  BUN 11  CREATININE 1.50*  CALCIUM 8.9  PROT 6.7  BILITOT 0.5  ALKPHOS 54  ALT 24  AST 29  GLUCOSE 127*    Recent Labs  01/02/15 2255 01/03/15 0304 01/03/15 1200  TROPONINI 0.23* <0.03 <0.03   Lipid Panel     Component Value Date/Time   CHOL 144 01/03/2015 0304   TRIG 101 01/03/2015 0304   HDL 54 01/03/2015 0304   CHOLHDL 2.7 01/03/2015 0304   VLDL 20 01/03/2015 0304   LDLCALC 70 01/03/2015 0304    B NATRIURETIC PEPTIDE  Date/Time Value Ref Range Status  01/02/2015 05:17 PM 61.5 0.0 - 100.0  pg/mL Final    Recent Labs  01/03/15 0304  INR 1.08      Radiology: Dg Chest Port 1 View  01/02/2015   CLINICAL DATA:  Chest pain for 1 day  EXAM: PORTABLE CHEST - 1 VIEW  COMPARISON:  02/14/2013  FINDINGS: A defibrillator is again seen. The cardiac shadow is stable. Postoperative changes are noted. The lungs are clear bilaterally. No bony abnormality is seen.  IMPRESSION: No acute abnormality noted.   Electronically Signed   By: Inez Catalina M.D.   On: 01/02/2015 17:57    Cardiac Cath:  01/03/2015 ANGIOGRAPHIC DATA: The left main coronary artery is patent without any significant obstruction. The left anterior descending artery is diffusely diseased in the proximal and mid segment. Competitive flow is noted in the mid and distal LAD. First agonal is severely diseased.. The left circumflex artery is patent. 4 obtuse marginal branches are noted. The distal circumflex before the fourth marginal is diffusely disease. Otherwise luminal irregularities are noted throughout.. The right coronary artery is totally occluded in the mid vessel.[name] BYPASS GRAFT ANGIOGRAPHY: Saphenous vein graft to the PDA is widely patent. Saphenous vein graft diagonal is widely patent. Luminal irregularities are noted in the 2 branches of the diagonal beyond the vein graft. LIMA to LAD is widely patent. LEFT VENTRICULOGRAM: Left ventricular angiogram was done in the 30 RAO projection and revealed a relatively normal size cavity. Abnormal contractile pattern is noted. EF is estimated to be 35%. No significant mitral regurgitation is seen. IMPRESSIONS: 1. Widely patent bypass grafts including saphenous vein grafts to the diagonal and RCA. LIMA to the LAD is widely patent. 2. Totally occluded mid RCA, totally occluded diagonal #1, and diffuse disease in the mid LAD. 3. Decreased left ventricular function with LVEF in the 35-40% range with dysynergy noted. RECOMMENDATION: Suspect microvascular vasoconstriction as the source of angina. Continue the current medical regimen  EKG: 01/03/2015 Atrial sensed, ventricular paced rhythm  FOLLOW UP PLANS AND APPOINTMENTS Allergies  Allergen Reactions  . Codeine Anaphylaxis  . Digoxin And Related     Unknown  . Diltiazem     Unknown  . Latex     Latex tape  . Metolazone     Unknown  . Morphine Sulfate     Unknown  . Myrbetriq [Mirabegron]     Causes headaches  . Onglyza [Saxagliptin]     Causes migraines  . Penicillins     Unknown  . Septra Ds  [Sulfamethoxazole W/Trimethoprim (Co-Trimoxazole)]     Unknown  . Spironolactone     Unknown  . Sulfa Antibiotics Other (See Comments)  . Sulfa Drugs Cross Reactors     Unknown  . Rema Fendt [Diltiazem Hcl]     Unknown  . Tetracyclines & Related     Unknown  . Ticlid [Ticlopidine Hcl]     Unknown  . Verapamil     Unknown  . Vicodin [Hydrocodone-Acetaminophen]     Unknown     Medication List    TAKE these medications        albuterol 108 (90 BASE) MCG/ACT inhaler  Commonly known as:  PROVENTIL HFA;VENTOLIN HFA  Inhale 2 puffs into the lungs every 4 (four) hours as needed for wheezing or shortness of breath.     ALPRAZolam 1 MG tablet  Commonly known as:  XANAX  Take 0.5-1 mg by mouth 3 (three) times daily as needed (1 tab in the AM, 0.5 tab at noon and 0.5 tab in the PM).  Artificial Tear Gel  Apply 2 drops to eye daily as needed (dry eyes). \     aspirin 325 MG EC tablet  Take 325 mg by mouth daily.     BIOTIN PO  Take 10,000 mcg by mouth daily. Biotin plus keratin     cetirizine 10 MG tablet  Commonly known as:  ZYRTEC  Take 10 mg by mouth daily.     clopidogrel 75 MG tablet  Commonly known as:  PLAVIX  Take 1 tablet by mouth  daily     [facility] BLACK COHOSH PO  Take 40 mg by mouth daily. Take 2 tabs daily 80 mg  total     doxazosin 4 MG tablet  Commonly known as:  CARDURA  Take 4 mg by mouth daily.     ezetimibe 10 MG tablet  Commonly known as:  ZETIA  Take 1 tablet by mouth  daily     fluticasone 50 MCG/ACT nasal spray  Commonly known as:  FLONASE  Place 2 sprays into the nose daily as needed for allergies or rhinitis.     Fluticasone-Salmeterol 100-50 MCG/DOSE Aepb  Commonly known as:  ADVAIR  Inhale 1 puff into the lungs daily as needed (shortness or breath or wheezing).     furosemide 80 MG tablet  Commonly known as:  LASIX  Take 1 tablet by mouth two  times daily     insulin glargine 100 UNIT/ML injection  Commonly known as:  LANTUS  Inject  18 Units into the skin at bedtime.     isosorbide mononitrate 30 MG 24 hr tablet  Commonly known as:  IMDUR  Take 1 tablet by mouth  daily     metoprolol 100 MG tablet  Commonly known as:  LOPRESSOR  Take 1 tablet (100 mg total) by mouth 2 (two) times daily.     multivitamin with minerals Tabs tablet  Take 1 tablet by mouth daily.     NITROSTAT 0.4 MG SL tablet  Generic drug:  nitroGLYCERIN  Place 1 tablet under the tongue every 5 minutes as needed for chest pain, max 3 doses, go to er if no relief     ondansetron 8 MG tablet  Commonly known as:  ZOFRAN  Take 8 mg by mouth every 8 (eight) hours as needed. For nausea     pantoprazole 40 MG tablet  Commonly known as:  PROTONIX  Take 40 mg by mouth daily.     potassium chloride 10 MEQ tablet  Commonly known as:  K-DUR  Take 2 tablets (20 mEq total) by mouth 2 (two) times daily.     ramipril 10 MG capsule  Commonly known as:  ALTACE  Take 1 capsule by mouth  twice daily     simvastatin 20 MG tablet  Commonly known as:  ZOCOR  Take 1 tablet (20 mg total) by mouth at bedtime. 1 tab daily     sodium chloride 5 % ophthalmic ointment  Commonly known as:  MURO 128  Place 1 drop into both eyes daily as needed. For dry eyes     traMADol 50 MG tablet  Commonly known as:  ULTRAM  Take 100 mg by mouth every 6 (six) hours as needed (pain). For pain     traZODone 50 MG tablet  Commonly known as:  DESYREL  Take 25 mg by mouth at bedtime as needed for sleep.     venlafaxine 100 MG tablet  Commonly known as:  EFFEXOR  Take 2 tablets  by mouth in the am 1 tablet by mouth in the pm     Vitamin D-3 1000 UNITS Caps  Take 1 capsule by mouth daily.        Discharge Instructions    Diet - low sodium heart healthy    Complete by:  As directed      Increase activity slowly    Complete by:  As directed           Follow-up Information    Follow up with Cristopher Peru, MD.   Specialty:  Cardiology   Why:  As scheduled   Contact  information:   [address] [place] [address] [phone]       Follow up with Dorothy Spark, MD.   Specialty:  Cardiology   Why:  The office will call   Contact information:   [address] [address] [phone]       BRING ALL MEDICATIONS WITH YOU TO FOLLOW UP APPOINTMENTS  Time spent with patient to include physician time: 36 min Signed: Rosaria Ferries, PA-C 01/05/2015, 5:27 PM Co-Sign MD

## 2015-01-12 ENCOUNTER — Telehealth: Payer: Self-pay | Admitting: Cardiology

## 2015-01-12 NOTE — Telephone Encounter (Signed)
Pt called with chest pain and her BiVICD alarming.  I discussed with Dr. Caryl Comes.  She will go to device clinic for alarm adjustment tomorrow and she will take an extra half of Imdur for her chest pain.  Cardiac cath last week was stable. She will call back or come to ER if further problems.

## 2015-01-15 DIAGNOSIS — M5416 Radiculopathy, lumbar region: Secondary | ICD-10-CM | POA: Diagnosis not present

## 2015-01-15 DIAGNOSIS — M5442 Lumbago with sciatica, left side: Secondary | ICD-10-CM | POA: Diagnosis not present

## 2015-01-16 ENCOUNTER — Telehealth: Payer: Self-pay | Admitting: *Deleted

## 2015-01-16 NOTE — Telephone Encounter (Signed)
Per patient she would prefer not to D/C the alarm prior gen change. She asked if the procedure could be done sooner than 3/23. I told her that I would send a message to Janan Halter and she will give her a call back about this. Patient voiced understanding.

## 2015-01-20 ENCOUNTER — Ambulatory Visit (INDEPENDENT_AMBULATORY_CARE_PROVIDER_SITE_OTHER): Payer: Medicare Other | Admitting: *Deleted

## 2015-01-20 DIAGNOSIS — Z95810 Presence of automatic (implantable) cardiac defibrillator: Secondary | ICD-10-CM

## 2015-01-20 NOTE — Progress Notes (Signed)
Remote ICD transmission.   

## 2015-01-20 NOTE — Telephone Encounter (Signed)
Spoke with patient and let her know he did not have the availability to move up appointment.  She verbalized understanding and appreciates the call

## 2015-01-22 ENCOUNTER — Other Ambulatory Visit (INDEPENDENT_AMBULATORY_CARE_PROVIDER_SITE_OTHER): Payer: Medicare Other | Admitting: *Deleted

## 2015-01-22 DIAGNOSIS — I509 Heart failure, unspecified: Secondary | ICD-10-CM

## 2015-01-22 LAB — BASIC METABOLIC PANEL
BUN: 14 mg/dL (ref 6–23)
CHLORIDE: 101 meq/L (ref 96–112)
CO2: 34 mEq/L — ABNORMAL HIGH (ref 19–32)
CREATININE: 1.55 mg/dL — AB (ref 0.40–1.20)
Calcium: 9.2 mg/dL (ref 8.4–10.5)
GFR: 43.64 mL/min — ABNORMAL LOW (ref >60.00)
GLUCOSE: 129 mg/dL — AB (ref 70–99)
Potassium: 3.7 mEq/L (ref 3.5–5.1)
Sodium: 139 mEq/L (ref 135–145)

## 2015-01-22 LAB — CBC WITH DIFFERENTIAL/PLATELET
Basophils Absolute: 0 10*3/uL (ref 0.0–0.1)
Basophils Relative: 0.4 % (ref 0.0–3.0)
Eosinophils Absolute: 0 10*3/uL (ref 0.0–0.7)
Eosinophils Relative: 0.2 % (ref 0.0–5.0)
HCT: 31 % — ABNORMAL LOW (ref 36.0–46.0)
Hemoglobin: 10.2 g/dL — ABNORMAL LOW (ref 12.0–15.0)
LYMPHS PCT: 34.4 % (ref 12.0–46.0)
Lymphs Abs: 2.4 10*3/uL (ref 0.7–4.0)
MCHC: 33 g/dL (ref 30.0–36.0)
MCV: 89.5 fl (ref 78.0–100.0)
MONOS PCT: 9.8 % (ref 3.0–12.0)
Monocytes Absolute: 0.7 10*3/uL (ref 0.1–1.0)
Neutro Abs: 3.9 10*3/uL (ref 1.4–7.7)
Neutrophils Relative %: 55.2 % (ref 43.0–77.0)
Platelets: 183 10*3/uL (ref 150.0–400.0)
RBC: 3.47 Mil/uL — AB (ref 3.87–5.11)
RDW: 13.6 % (ref 11.5–15.5)
WBC: 7.1 10*3/uL (ref 4.0–10.5)

## 2015-01-27 ENCOUNTER — Telehealth: Payer: Self-pay | Admitting: Internal Medicine

## 2015-01-27 NOTE — Telephone Encounter (Signed)
Called patient back about her instructions for procedure on Wednesday. Patient has a follow-up appointment with Kerin Ransom PA, tomorrow. Informed patient that she could get a copy of these instructions tomorrow.

## 2015-01-27 NOTE — Telephone Encounter (Signed)
New Message  Pt calling to see if the Rn could call and discuss cath instructions. Pt has EPH w/ Kerin Ransom on 3/22. Please call back and discuss.

## 2015-01-28 ENCOUNTER — Encounter: Payer: Self-pay | Admitting: Cardiology

## 2015-01-28 ENCOUNTER — Ambulatory Visit (INDEPENDENT_AMBULATORY_CARE_PROVIDER_SITE_OTHER): Payer: Medicare Other | Admitting: Cardiology

## 2015-01-28 VITALS — BP 108/72 | HR 68 | Ht 64.0 in | Wt 175.1 lb

## 2015-01-28 DIAGNOSIS — I200 Unstable angina: Secondary | ICD-10-CM | POA: Diagnosis not present

## 2015-01-28 DIAGNOSIS — E119 Type 2 diabetes mellitus without complications: Secondary | ICD-10-CM | POA: Diagnosis not present

## 2015-01-28 DIAGNOSIS — Z95810 Presence of automatic (implantable) cardiac defibrillator: Secondary | ICD-10-CM

## 2015-01-28 DIAGNOSIS — I252 Old myocardial infarction: Secondary | ICD-10-CM | POA: Diagnosis not present

## 2015-01-28 DIAGNOSIS — K219 Gastro-esophageal reflux disease without esophagitis: Secondary | ICD-10-CM | POA: Diagnosis not present

## 2015-01-28 DIAGNOSIS — I5022 Chronic systolic (congestive) heart failure: Secondary | ICD-10-CM

## 2015-01-28 DIAGNOSIS — G43909 Migraine, unspecified, not intractable, without status migrainosus: Secondary | ICD-10-CM | POA: Diagnosis not present

## 2015-01-28 DIAGNOSIS — F419 Anxiety disorder, unspecified: Secondary | ICD-10-CM | POA: Diagnosis not present

## 2015-01-28 DIAGNOSIS — Z79891 Long term (current) use of opiate analgesic: Secondary | ICD-10-CM | POA: Diagnosis not present

## 2015-01-28 DIAGNOSIS — F329 Major depressive disorder, single episode, unspecified: Secondary | ICD-10-CM | POA: Diagnosis not present

## 2015-01-28 DIAGNOSIS — J45909 Unspecified asthma, uncomplicated: Secondary | ICD-10-CM | POA: Diagnosis not present

## 2015-01-28 DIAGNOSIS — Z794 Long term (current) use of insulin: Secondary | ICD-10-CM | POA: Diagnosis not present

## 2015-01-28 DIAGNOSIS — I2510 Atherosclerotic heart disease of native coronary artery without angina pectoris: Secondary | ICD-10-CM | POA: Diagnosis not present

## 2015-01-28 DIAGNOSIS — R079 Chest pain, unspecified: Secondary | ICD-10-CM | POA: Diagnosis not present

## 2015-01-28 DIAGNOSIS — I255 Ischemic cardiomyopathy: Secondary | ICD-10-CM | POA: Diagnosis not present

## 2015-01-28 DIAGNOSIS — Z79899 Other long term (current) drug therapy: Secondary | ICD-10-CM | POA: Diagnosis not present

## 2015-01-28 DIAGNOSIS — Z4502 Encounter for adjustment and management of automatic implantable cardiac defibrillator: Secondary | ICD-10-CM | POA: Diagnosis not present

## 2015-01-28 DIAGNOSIS — Z8673 Personal history of transient ischemic attack (TIA), and cerebral infarction without residual deficits: Secondary | ICD-10-CM | POA: Diagnosis not present

## 2015-01-28 DIAGNOSIS — I447 Left bundle-branch block, unspecified: Secondary | ICD-10-CM | POA: Diagnosis not present

## 2015-01-28 DIAGNOSIS — N189 Chronic kidney disease, unspecified: Secondary | ICD-10-CM | POA: Diagnosis not present

## 2015-01-28 DIAGNOSIS — Z7951 Long term (current) use of inhaled steroids: Secondary | ICD-10-CM | POA: Diagnosis not present

## 2015-01-28 DIAGNOSIS — Z9889 Other specified postprocedural states: Secondary | ICD-10-CM | POA: Diagnosis not present

## 2015-01-28 DIAGNOSIS — Z8249 Family history of ischemic heart disease and other diseases of the circulatory system: Secondary | ICD-10-CM | POA: Diagnosis not present

## 2015-01-28 DIAGNOSIS — Z7982 Long term (current) use of aspirin: Secondary | ICD-10-CM | POA: Diagnosis not present

## 2015-01-28 DIAGNOSIS — I129 Hypertensive chronic kidney disease with stage 1 through stage 4 chronic kidney disease, or unspecified chronic kidney disease: Secondary | ICD-10-CM | POA: Diagnosis not present

## 2015-01-28 MED ORDER — SODIUM CHLORIDE 0.9 % IR SOLN
80.0000 mg | Status: AC
  Filled 2015-01-28: qty 2

## 2015-01-28 MED ORDER — MUPIROCIN 2 % EX OINT
1.0000 "application " | TOPICAL_OINTMENT | Freq: Once | CUTANEOUS | Status: AC
  Administered 2015-01-29: 1 via TOPICAL
  Filled 2015-01-28: qty 22

## 2015-01-28 MED ORDER — VANCOMYCIN HCL IN DEXTROSE 1-5 GM/200ML-% IV SOLN
1000.0000 mg | INTRAVENOUS | Status: AC
  Filled 2015-01-28: qty 200

## 2015-01-28 MED ORDER — SODIUM CHLORIDE 0.9 % IV SOLN
INTRAVENOUS | Status: DC
  Administered 2015-01-29: 14:00:00 via INTRAVENOUS

## 2015-01-28 MED ORDER — SODIUM CHLORIDE 0.9 % IJ SOLN: 3.0000 mL | INTRAMUSCULAR | Status: DC | PRN

## 2015-01-28 MED ORDER — SODIUM CHLORIDE 0.9 % IJ SOLN: 3.0000 mL | Freq: Two times a day (BID) | INTRAMUSCULAR | Status: DC

## 2015-01-28 MED ORDER — SODIUM CHLORIDE 0.9 % IV SOLN: 250.0000 mL | INTRAVENOUS | Status: DC

## 2015-01-28 MED ORDER — CHLORHEXIDINE GLUCONATE 4 % EX LIQD
60.0000 mL | Freq: Once | CUTANEOUS | Status: DC
  Filled 2015-01-28: qty 60

## 2015-01-28 NOTE — Assessment & Plan Note (Signed)
At EOL, to be admitted for elective generator change

## 2015-01-28 NOTE — Assessment & Plan Note (Signed)
Cath 01/03/15 showed patent grafts- continue medical Rx

## 2015-01-28 NOTE — Assessment & Plan Note (Signed)
EF 35% at cath

## 2015-01-28 NOTE — Progress Notes (Signed)
01/28/2015 [name]   October 16, 1951  [id number]  Primary Physician Reginia Naas, MD Primary Cardiologist: Dr Tamala Julian  HPI:  64 y/o pleasant female who had CABG in 1998. Unfortunately she has had problems with chest pain since and has had multiple caths. She has been treated medically but also has a hsitory of multiple drug intolerances. She does have an ICM and has an ICD (which apparently is at EOL). She was recently admitted for chest pain and re studied. She had patent grafts and it is assumed she may have SVD. Since discharge she has done well. We have instructed her to take extra Imdur if she has chest pain at home.    Current Outpatient Prescriptions  Medication Sig Dispense Refill  . albuterol (PROVENTIL HFA;VENTOLIN HFA) 108 (90 BASE) MCG/ACT inhaler Inhale 2 puffs into the lungs every 4 (four) hours as needed for wheezing or shortness of breath.    . ALPRAZolam (XANAX) 1 MG tablet Take 0.5-1 mg by mouth 3 (three) times daily as needed (1 tab in the AM, 0.5 tab at noon and 0.5 tab in the PM).     . Artificial Tear GEL Apply 2 drops to eye daily as needed (dry eyes). \    . aspirin 325 MG EC tablet Take 325 mg by mouth daily.      [name] BIOTIN PO Take 10,000 mcg by mouth daily. Biotin plus keratin    . cetirizine (ZYRTEC) 10 MG tablet Take 10 mg by mouth daily.    . Cholecalciferol (VITAMIN D-3) 1000 UNITS CAPS Take 1 capsule by mouth daily.    . clopidogrel (PLAVIX) 75 MG tablet Take 1 tablet by mouth  daily 90 tablet 0  . [facility] BLACK COHOSH PO Take 40 mg by mouth daily. Take 2 tabs daily 80 mg  total    . doxazosin (CARDURA) 4 MG tablet Take 4 mg by mouth daily.    [name] ezetimibe (ZETIA) 10 MG tablet Take 1 tablet by mouth  daily 90 tablet 3  . fluticasone (FLONASE) 50 MCG/ACT nasal spray Place 2 sprays into the nose daily as needed for allergies or rhinitis.     . Fluticasone-Salmeterol (ADVAIR) 100-50 MCG/DOSE AEPB Inhale 1 puff into the lungs daily as needed (shortness or  breath or wheezing).     . furosemide (LASIX) 80 MG tablet Take 1 tablet by mouth two  times daily 180 tablet 0  . insulin glargine (LANTUS) 100 UNIT/ML injection Inject 18 Units into the skin at bedtime.     . isosorbide mononitrate (IMDUR) 30 MG 24 hr tablet Take 1 tablet by mouth  daily 90 tablet 1  . metoprolol (LOPRESSOR) 100 MG tablet Take 1 tablet (100 mg total) by mouth 2 (two) times daily. 180 tablet 3  . Multiple Vitamin (MULTIVITAMIN WITH MINERALS) TABS Take 1 tablet by mouth daily.    [name] NITROSTAT 0.4 MG SL tablet Place 1 tablet under the tongue every 5 minutes as needed for chest pain, max 3 doses, go to er if no relief 25 tablet 2  . omeprazole (PRILOSEC) 40 MG capsule Take 40 mg by mouth daily.    . ondansetron (ZOFRAN) 8 MG tablet Take 8 mg by mouth every 8 (eight) hours as needed. For nausea    . pantoprazole (PROTONIX) 40 MG tablet Take 40 mg by mouth daily.    . potassium chloride (K-DUR) 10 MEQ tablet Take 2 tablets (20 mEq total) by mouth 2 (two) times daily. 360 tablet  1  . ramipril (ALTACE) 10 MG capsule Take 1 capsule by mouth  twice daily 180 capsule 0  . simvastatin (ZOCOR) 20 MG tablet Take 1 tablet (20 mg total) by mouth at bedtime. 1 tab daily 90 tablet 3  . sodium chloride (MURO 128) 5 % ophthalmic ointment Place 1 drop into both eyes daily as needed. For dry eyes    . traMADol (ULTRAM) 50 MG tablet Take 100 mg by mouth every 6 (six) hours as needed (pain). For pain    . traZODone (DESYREL) 50 MG tablet Take 25 mg by mouth at bedtime as needed for sleep.    [name] venlafaxine (EFFEXOR) 100 MG tablet Take 100 mg by mouth See admin instructions. Take 2 tablets by mouth in the am 1 tablet by mouth in the pm     No current facility-administered medications for this visit.    Allergies  Allergen Reactions  . Codeine Anaphylaxis  . Digoxin And Related     Unknown  . Diltiazem     Unknown  . Latex     Latex tape  . Metolazone     Unknown  . Morphine Sulfate      Unknown  . Myrbetriq [Mirabegron]     Causes headaches  . Onglyza [Saxagliptin]     Causes migraines  . Penicillins     Unknown  . Septra Ds [Sulfamethoxazole W/Trimethoprim (Co-Trimoxazole)]     Unknown  . Spironolactone     Unknown  . Sulfa Antibiotics Other (See Comments)  . Sulfa Drugs Cross Reactors     Unknown  . Rema Fendt [Diltiazem Hcl]     Unknown  . Tetracyclines & Related     Unknown  . Ticlid [Ticlopidine Hcl]     Unknown  . Verapamil     Unknown  . Vicodin [Hydrocodone-Acetaminophen]     Unknown    History   Social History  . Marital Status: Married    Spouse Name: [name]  . Number of Children: 1  . Years of Education: MA   Occupational History  .      Disablity   Social History Main Topics  . Smoking status: Never Smoker   . Smokeless tobacco: Never Used  . Alcohol Use: Yes     Comment: 02/14/2013 "glass of wine q blue moon/vacation"  . Drug Use: No  . Sexual Activity: Yes    Birth Control/ Protection: Surgical   Other Topics Concern  . Not on file   Social History Narrative   Patient lives at home with spouse.     Daughter name is Tourist information centre manager.   Caffeine Use: none     Review of Systems: General: negative for chills, fever, night sweats or weight changes.  Cardiovascular: negative for chest pain, dyspnea on exertion, edema, orthopnea, palpitations, paroxysmal nocturnal dyspnea or shortness of breath Dermatological: negative for rash Respiratory: negative for cough or wheezing Urologic: negative for hematuria Abdominal: negative for nausea, vomiting, diarrhea, bright red blood per rectum, melena, or hematemesis Neurologic: negative for visual changes, syncope, or dizziness All other systems reviewed and are otherwise negative except as noted above.    Blood pressure 108/72, pulse 68, height 5\' 4"  (1.626 m), weight 175 lb 1.9 oz (79.434 kg).  General appearance: alert, cooperative, no distress and mildly obese Lungs: clear to auscultation  bilaterally Heart: regular rate and rhythm Extremities: no edema    ASSESSMENT AND PLAN:   Chest pain with moderate risk of acute coronary syndrome Cath 01/03/15 showed  patent grafts- continue medical Rx   H/O CABG 1998, multiple caths, cath 01/03/15 showed patent grafts .   DM (diabetes mellitus) .   CVA (cerebral infarction) Instructed to take full dose ASA and Plavix per pt   Biventricular implantable cardioverter-defibrillator in situ At EOL, to be admitted for elective generator change   Ischemic cardiomyopathy EF 35% at cath    PLAN  Same cardiac Rx, f/u Dr Tamala Julian 6 months. She is for EOL gen change tomorrow with Dr Lovena Le.   Elanor Cale KPA-C 01/28/2015 11:56 AM

## 2015-01-28 NOTE — Assessment & Plan Note (Signed)
Instructed to take full dose ASA and Plavix per pt

## 2015-01-28 NOTE — Patient Instructions (Signed)
Your physician recommends that you continue on your current medications as directed. Please refer to the Current Medication list given to you today.     Your physician wants you to follow-up in: mwith dr Tamala Julian in 6 months  You will receive a reminder letter in the mail two months in advance. If you don't receive a letter, please call our office to schedule the follow-up appointment.     Cardioverter Defibrillator Implantation An implantable cardioverter defibrillator (ICD) is a small, lightweight, battery-powered device that is placed (implanted) under the skin in the chest or abdomen. Your caregiver may prescribe an ICD if:  You have had an irregular heart rhythm (arrhythmia) that originated in the lower chambers of the heart (ventricles).  Your heart has been damaged by a disease (such as coronary artery disease) or heart condition (such as a heart attack). An ICD consists of a battery that lasts several years, a small computer called a pulse generator, and wires called leads that go into the heart. It is used to detect and correct two dangerous arrhythmias: a rapid heart rhythm (tachycardia) and an arrhythmia in which the ventricles contract in an uncoordinated way (fibrillation). When an ICD detects tachycardia, it sends an electrical signal to the heart that restores the heartbeat to normal (cardioversion). This signal is usually painless. If cardioversion does not work or if the ICD detects fibrillation, it delivers a small electrical shock to the heart (defibrillation) to restart the heart. The shock may feel like a strong jolt in the chest.ICDs may be programmed to correct other problems. Sometimes, ICDs are programmed to act as another type of implantable device called a pacemaker. Pacemakers are used to treat a slow heartbeat (bradycardia). LET YOUR CAREGIVER KNOW ABOUT:  Any allergies you have.  All medicines you are taking, including vitamins, herbs, eyedrops, and over-the-counter  medicines and creams.  Previous problems you or members of your family have had with the use of anesthetics.  Any blood disorders you have had.  Other health problems you have. RISKS AND COMPLICATIONS Generally, the procedure to implant an ICD is safe. However, as with any surgical procedure, complications can occur. Possible complications associated with implanting an ICD include:  Swelling, bleeding, or bruising at the site where the ICD was implanted.  Infection at the site where the ICD was implanted.  A reaction to medicine used during the procedure.  Nerve, heart, or blood vessel damage.  Blood clots. BEFORE THE PROCEDURE  You may need to have blood tests, heart tests, or a chest X-ray done before the day of the procedure.  Ask your caregiver about changing or stopping your regular medicines.  Make plans to have someone drive you home. You may need to stay in the hospital overnight after the procedure.  Stop smoking at least 24 hours before the procedure.  Take a bath or shower the night before the procedure. You may need to scrub your chest or abdomen with a special type of soap.  Do not eat or drink before your procedure for as long as directed by your caregiver. Ask if it is okay to take any needed medicine with a small sip of water. PROCEDURE  The procedure to implant an ICD in your chest or abdomen is usually done at a hospital in a room that has a large X-ray machine called a fluoroscope. The machine will be above you during the procedure. It will help your caregiver see your heart during the procedure. Implanting an ICD usually takes  1-3 hours. Before the procedure:   Small monitors will be put on your body. They will be used to check your heart, blood pressure, and oxygen level.  A needle will be put into a vein in your hand or arm. This is called an intravenous (IV) access tube. Fluids and medicine will flow directly into your body through the IV tube.  Your  chest or abdomen will be cleaned with a germ-killing (antiseptic) solution. The area may be shaved.  You may be given medicine to help you relax (sedative).  You will be given a medicine called a local anesthetic. This medicine will make the surgical site numb while the ICD is implanted. You will be sleepy but awake during the procedure. After you are numb the procedure will begin. The caregiver will:  Make a small cut (incision). This will make a pocket deep under your skin that will hold the pulse generator.  Guide the leads through a large blood vessel into your heart and attach them to the heart muscles. Depending on the ICD, the leads may go into one ventricle or they may go to both ventricles and into an upper chamber of the heart (atrium).  Test the ICD.  Close the incision with stitches, glue, or staples. AFTER THE PROCEDURE  You may feel pain. Some pain is normal. It may last a few days.  You may stay in a recovery area until the local anesthetic has worn off. Your blood pressure and pulse will be checked often. You will be taken to a room where your heart will be monitored.  A chest X-ray will be taken. This is done to check that the cardioverter defibrillator is in the right place.  You may stay in the hospital overnight.  A slight bump may be seen over the skin where the ICD was placed. Sometimes, it is possible to feel the ICD under the skin. This is normal.  In the months and years afterward, your caregiver will check the device, the leads, and the battery every few months. Eventually, when the battery is low, the ICD will be replaced. Document Released: 07/17/2002 Document Revised: 08/15/2013 Document Reviewed: 11/13/2012 [facility] Patient Information 2015 [place], [place]. This information is not intended to replace advice given to you by your health care provider. Make sure you discuss any questions you have with your health care provider.

## 2015-01-29 ENCOUNTER — Encounter (HOSPITAL_COMMUNITY)
Admission: RE | Disposition: A | Discharge status: Home / Self Care | Payer: Self-pay | Source: Ambulatory Visit | Attending: Internal Medicine

## 2015-01-29 ENCOUNTER — Ambulatory Visit (HOSPITAL_COMMUNITY)
Admission: RE | Admit: 2015-01-29 | Discharge: 2015-01-29 | Disposition: A | Discharge status: Home / Self Care | Payer: Medicare Other | Source: Ambulatory Visit | Attending: Internal Medicine | Admitting: Internal Medicine

## 2015-01-29 ENCOUNTER — Encounter (HOSPITAL_COMMUNITY): Payer: Self-pay | Admitting: Emergency Medicine

## 2015-01-29 ENCOUNTER — Emergency Department (HOSPITAL_COMMUNITY)
Admission: EM | Admit: 2015-01-29 | Discharge: 2015-01-29 | Disposition: A | Discharge status: Home / Self Care | Payer: Medicare Other | Attending: Emergency Medicine | Admitting: Emergency Medicine

## 2015-01-29 DIAGNOSIS — F329 Major depressive disorder, single episode, unspecified: Secondary | ICD-10-CM | POA: Insufficient documentation

## 2015-01-29 DIAGNOSIS — Z4502 Encounter for adjustment and management of automatic implantable cardiac defibrillator: Secondary | ICD-10-CM

## 2015-01-29 DIAGNOSIS — Z794 Long term (current) use of insulin: Secondary | ICD-10-CM | POA: Diagnosis not present

## 2015-01-29 DIAGNOSIS — N189 Chronic kidney disease, unspecified: Secondary | ICD-10-CM | POA: Insufficient documentation

## 2015-01-29 DIAGNOSIS — T82198A Other mechanical complication of other cardiac electronic device, initial encounter: Secondary | ICD-10-CM | POA: Insufficient documentation

## 2015-01-29 DIAGNOSIS — Z880 Allergy status to penicillin: Secondary | ICD-10-CM | POA: Diagnosis not present

## 2015-01-29 DIAGNOSIS — T82598A Other mechanical complication of other cardiac and vascular devices and implants, initial encounter: Secondary | ICD-10-CM | POA: Diagnosis not present

## 2015-01-29 DIAGNOSIS — I447 Left bundle-branch block, unspecified: Secondary | ICD-10-CM | POA: Insufficient documentation

## 2015-01-29 DIAGNOSIS — Z862 Personal history of diseases of the blood and blood-forming organs and certain disorders involving the immune mechanism: Secondary | ICD-10-CM | POA: Diagnosis not present

## 2015-01-29 DIAGNOSIS — Z9889 Other specified postprocedural states: Secondary | ICD-10-CM | POA: Insufficient documentation

## 2015-01-29 DIAGNOSIS — Z8673 Personal history of transient ischemic attack (TIA), and cerebral infarction without residual deficits: Secondary | ICD-10-CM | POA: Insufficient documentation

## 2015-01-29 DIAGNOSIS — Z950 Presence of cardiac pacemaker: Secondary | ICD-10-CM

## 2015-01-29 DIAGNOSIS — K219 Gastro-esophageal reflux disease without esophagitis: Secondary | ICD-10-CM | POA: Diagnosis not present

## 2015-01-29 DIAGNOSIS — G43909 Migraine, unspecified, not intractable, without status migrainosus: Secondary | ICD-10-CM | POA: Insufficient documentation

## 2015-01-29 DIAGNOSIS — Z7951 Long term (current) use of inhaled steroids: Secondary | ICD-10-CM | POA: Insufficient documentation

## 2015-01-29 DIAGNOSIS — Y831 Surgical operation with implant of artificial internal device as the cause of abnormal reaction of the patient, or of later complication, without mention of misadventure at the time of the procedure: Secondary | ICD-10-CM | POA: Insufficient documentation

## 2015-01-29 DIAGNOSIS — I509 Heart failure, unspecified: Secondary | ICD-10-CM

## 2015-01-29 DIAGNOSIS — E119 Type 2 diabetes mellitus without complications: Secondary | ICD-10-CM | POA: Insufficient documentation

## 2015-01-29 DIAGNOSIS — I255 Ischemic cardiomyopathy: Secondary | ICD-10-CM | POA: Insufficient documentation

## 2015-01-29 DIAGNOSIS — I2510 Atherosclerotic heart disease of native coronary artery without angina pectoris: Secondary | ICD-10-CM | POA: Diagnosis not present

## 2015-01-29 DIAGNOSIS — I5022 Chronic systolic (congestive) heart failure: Secondary | ICD-10-CM | POA: Diagnosis not present

## 2015-01-29 DIAGNOSIS — Z8249 Family history of ischemic heart disease and other diseases of the circulatory system: Secondary | ICD-10-CM | POA: Insufficient documentation

## 2015-01-29 DIAGNOSIS — Z8742 Personal history of other diseases of the female genital tract: Secondary | ICD-10-CM | POA: Diagnosis not present

## 2015-01-29 DIAGNOSIS — Z79899 Other long term (current) drug therapy: Secondary | ICD-10-CM | POA: Insufficient documentation

## 2015-01-29 DIAGNOSIS — Z91040 Latex allergy status: Secondary | ICD-10-CM | POA: Diagnosis not present

## 2015-01-29 DIAGNOSIS — Z8701 Personal history of pneumonia (recurrent): Secondary | ICD-10-CM | POA: Insufficient documentation

## 2015-01-29 DIAGNOSIS — Z7982 Long term (current) use of aspirin: Secondary | ICD-10-CM | POA: Insufficient documentation

## 2015-01-29 DIAGNOSIS — F419 Anxiety disorder, unspecified: Secondary | ICD-10-CM | POA: Insufficient documentation

## 2015-01-29 DIAGNOSIS — I129 Hypertensive chronic kidney disease with stage 1 through stage 4 chronic kidney disease, or unspecified chronic kidney disease: Secondary | ICD-10-CM | POA: Diagnosis not present

## 2015-01-29 DIAGNOSIS — I25119 Atherosclerotic heart disease of native coronary artery with unspecified angina pectoris: Secondary | ICD-10-CM | POA: Insufficient documentation

## 2015-01-29 DIAGNOSIS — I252 Old myocardial infarction: Secondary | ICD-10-CM | POA: Insufficient documentation

## 2015-01-29 DIAGNOSIS — Z8619 Personal history of other infectious and parasitic diseases: Secondary | ICD-10-CM | POA: Insufficient documentation

## 2015-01-29 DIAGNOSIS — Z79891 Long term (current) use of opiate analgesic: Secondary | ICD-10-CM | POA: Insufficient documentation

## 2015-01-29 DIAGNOSIS — J45909 Unspecified asthma, uncomplicated: Secondary | ICD-10-CM | POA: Diagnosis not present

## 2015-01-29 HISTORY — PX: BIV PACEMAKER GENERATOR CHANGE OUT: SHX5746

## 2015-01-29 LAB — GLUCOSE, CAPILLARY: GLUCOSE-CAPILLARY: 133 mg/dL — AB (ref 70–99)

## 2015-01-29 LAB — SURGICAL PCR SCREEN
MRSA, PCR: NEGATIVE
Staphylococcus aureus: NEGATIVE

## 2015-01-29 SURGERY — BIV PACEMAKER GENERATOR CHANGE OUT

## 2015-01-29 MED ORDER — MUPIROCIN 2 % EX OINT
TOPICAL_OINTMENT | CUTANEOUS | Status: AC
  Administered 2015-01-29: 1 via TOPICAL
  Filled 2015-01-29: qty 22

## 2015-01-29 MED ORDER — LIDOCAINE HCL (PF) 1 % IJ SOLN
INTRAMUSCULAR | Status: AC
  Filled 2015-01-29: qty 30

## 2015-01-29 MED ORDER — MIDAZOLAM HCL 2 MG/2ML IJ SOLN
INTRAMUSCULAR | Status: AC
  Filled 2015-01-29: qty 2

## 2015-01-29 MED ORDER — HEPARIN (PORCINE) IN NACL 2-0.9 UNIT/ML-% IJ SOLN
INTRAMUSCULAR | Status: AC
  Filled 2015-01-29: qty 500

## 2015-01-29 MED ORDER — FENTANYL CITRATE 0.05 MG/ML IJ SOLN
INTRAMUSCULAR | Status: AC
  Filled 2015-01-29: qty 2

## 2015-01-29 MED ORDER — ONDANSETRON HCL 4 MG/2ML IJ SOLN: 4.0000 mg | Freq: Four times a day (QID) | INTRAMUSCULAR | Status: DC | PRN

## 2015-01-29 NOTE — Progress Notes (Signed)
IV saline locked. 

## 2015-01-29 NOTE — ED Provider Notes (Signed)
CSN: [id number]     Arrival date & time 01/29/15  2052 History   First MD Initiated Contact with Patient 01/29/15 2057     Chief Complaint  Patient presents with  . Pacemaker Problem     (Consider location/radiation/quality/duration/timing/severity/associated sxs/prior Treatment) HPI Comments: 63 year old female with history of CABG, hypertension, diabetes, pacemaker who presents with pacemaker alarm. No other complaints. Battery was replaced yesterday. Denies chest pain, shortness of breath, nausea, vomiting, diarrhea, dizziness. Alarm started today and the device rep is at the bedside.  The history is provided by the patient and a caregiver (Device rep).    Past Medical History  Diagnosis Date  . Ischemic cardiomyopathy     status post biventricular ICD placed by DR Edumunds who used to see Dr Melvern Banker here to establish  cardiovascular care.  . Hypertension   . Coronary artery disease   . CHF (congestive heart failure)   . MVP (mitral valve prolapse)     Antibiotics not required for procedures  . Asthma   . Anxiety   . Depression   . Hiatal hernia   . Cervical dysplasia   . Fibroid   . Function kidney decreased   . History of shingles   . Chronic kidney disease   . ICD (implantable cardiac defibrillator) in place   . Pacemaker   . Complication of anesthesia     "I wake up during surgeries" (02/14/2013)  . Anginal pain   . Myocardial infarction     "I've had 2; the others they were able to catch before completing" (02/14/2013)  . Pneumonia 1950's & 1985  . Shortness of breath     "lying down flat; at times w/exertion" (02/14/2013)  . Type II diabetes mellitus   . Iron deficiency anemia   . GERD (gastroesophageal reflux disease)   . History of stomach ulcers   . Migraines   . Stroke     "2 confirmed; 9 TIA's; results in dragging LLE; numbness in tip of tongue" (02/14/2013)   Past Surgical History  Procedure Laterality Date  . Insert / replace / remove pacemaker     biventricular defibrillator--06/10/ 2009  . Abdominal hysterectomy  1985    TAH   . Cardiac defibrillator placement    . Coronary angioplasty with stent placement      "started out w/5; bypass corrected some; 1 stent since the bypass" (02/14/2013)  . Breast excisional biopsy Left 01/2007; 06/2007; 03/2008    "benign" (02/14/2013)  . Cardiac catheterization      "probably in the teens" (02/14/2013)  . Colonoscopy  ~ 2002  . Tee without cardioversion  11/14/2012    Procedure: TRANSESOPHAGEAL ECHOCARDIOGRAM (TEE);  Surgeon: Candee Furbish, MD;  [facility] ENDOSCOPY;  Service: Cardiovascular;  Laterality: N/A;  . Supraventricular tachycardia ablation  06/2007  . Biv icd genertaor change out  06/2007; 04/2008    "2 lead initial placement, at [facility]; done at [facility], after developing CHF" (02/14/2013)  . Coronary artery bypass graft  ` 1998    LIMA-LAD, SVG-D1, SVG-PDA  . Breast surgery    . Dilation and curettage of uterus  1975 X 2; 1976; 1977  . Left heart catheterization with coronary/graft angiogram N/A 01/03/2015    Procedure: LEFT HEART CATHETERIZATION WITH Beatrix Fetters;  Surgeon: Sinclair Grooms, MD;  Location: [facility] CATH LAB;  Service: Cardiovascular;  Laterality: N/A;   Family History  Problem Relation Age of Onset  . Heart disease Mother   . Hypertension Mother   .  Heart disease Father   . Hypertension Father   . Diabetes Father   . Kidney failure Father   . Heart disease Brother   . Diabetes Brother   . Kidney failure Brother   . Diabetes Paternal Grandmother   . Heart attack Mother   . Heart attack Father   . Heart attack Brother   . Stroke Mother   . Stroke Father    History  Substance Use Topics  . Smoking status: Never Smoker   . Smokeless tobacco: Never Used  . Alcohol Use: Yes     Comment: 02/14/2013 "glass of wine q blue moon/vacation"   OB History    Gravida Para Term Preterm AB TAB SAB Ectopic Multiple Living   7 2  2 5     1      Review of Systems  All other  systems reviewed and are negative.     Allergies  Codeine; Digoxin and related; Diltiazem; Latex; Metolazone; Morphine sulfate; Myrbetriq; Onglyza; Penicillins; Septra ds; Spironolactone; Sulfa antibiotics; Sulfa drugs cross reactors; Taztia xt; Tetracyclines & related; Ticlid; Verapamil; and Vicodin  Home Medications   Prior to Admission medications   Medication Sig Start Date End Date Taking? Authorizing Provider  albuterol (PROVENTIL HFA;VENTOLIN HFA) 108 (90 BASE) MCG/ACT inhaler Inhale 2 puffs into the lungs every 4 (four) hours as needed for wheezing or shortness of breath.    Historical Provider, MD  ALPRAZolam Duanne Moron) 1 MG tablet Take 0.5-1 mg by mouth 3 (three) times daily as needed (1 tab in the AM, 0.5 tab at noon and 0.5 tab in the PM).     Historical Provider, MD  Artificial Tear GEL Apply 2 drops to eye daily as needed (dry eyes). \    Historical Provider, MD  aspirin 325 MG EC tablet Take 325 mg by mouth daily.      Historical Provider, MD  BIOTIN PO Take 10,000 mcg by mouth daily. Biotin plus keratin    Historical Provider, MD  cetirizine (ZYRTEC) 10 MG tablet Take 10 mg by mouth daily.    Historical Provider, MD  Cholecalciferol (VITAMIN D-3) 1000 UNITS CAPS Take 1 capsule by mouth daily.    Historical Provider, MD  clopidogrel (PLAVIX) 75 MG tablet Take 1 tablet by mouth  daily 10/14/14   Belva Crome, MD  [facility] PO Take 40 mg by mouth daily. Take 2 tabs daily 80 mg  total    Historical Provider, MD  doxazosin (CARDURA) 4 MG tablet Take 4 mg by mouth daily.    Historical Provider, MD  ezetimibe (ZETIA) 10 MG tablet Take 1 tablet by mouth  daily 12/26/14   Belva Crome, MD  fluticasone [facility]) 50 MCG/ACT nasal spray Place 2 sprays into the nose daily as needed for allergies or rhinitis.     Historical Provider, MD  Fluticasone-Salmeterol (ADVAIR) 100-50 MCG/DOSE AEPB Inhale 1 puff into the lungs daily as needed (shortness or breath or wheezing).     Historical  Provider, MD  furosemide (LASIX) 80 MG tablet Take 1 tablet by mouth two  times daily 10/10/14   Belva Crome, MD  insulin glargine (LANTUS) 100 UNIT/ML injection Inject 18 Units into the skin at bedtime.     Historical Provider, MD  isosorbide mononitrate (IMDUR) 30 MG 24 hr tablet Take 1 tablet by mouth  daily 08/23/14   Belva Crome, MD  metoprolol (LOPRESSOR) 100 MG tablet Take 1 tablet (100 mg total) by mouth 2 (two) times  daily. 12/26/14   Belva Crome, MD  Multiple Vitamin (MULTIVITAMIN WITH MINERALS) TABS Take 1 tablet by mouth daily.    Historical Provider, MD  NITROSTAT 0.4 MG SL tablet Place 1 tablet under the tongue every 5 minutes as needed for chest pain, max 3 doses, go to er if no relief 11/18/14   Belva Crome, MD  omeprazole (PRILOSEC) 40 MG capsule Take 40 mg by mouth daily.    Historical Provider, MD  ondansetron (ZOFRAN) 8 MG tablet Take 8 mg by mouth every 8 (eight) hours as needed. For nausea    Historical Provider, MD  pantoprazole (PROTONIX) 40 MG tablet Take 40 mg by mouth daily.    Historical Provider, MD  potassium chloride (K-DUR) 10 MEQ tablet Take 2 tablets (20 mEq total) by mouth 2 (two) times daily. 11/26/13   Belva Crome, MD  ramipril (ALTACE) 10 MG capsule Take 1 capsule by mouth  twice daily 10/10/14   Belva Crome, MD  simvastatin (ZOCOR) 20 MG tablet Take 1 tablet (20 mg total) by mouth at bedtime. 1 tab daily 12/26/14   Belva Crome, MD  sodium chloride (MURO 128) 5 % ophthalmic ointment Place 1 drop into both eyes daily as needed. For dry eyes    Historical Provider, MD  traMADol (ULTRAM) 50 MG tablet Take 100 mg by mouth every 6 (six) hours as needed (pain). For pain    Historical Provider, MD  traZODone (DESYREL) 50 MG tablet Take 25 mg by mouth at bedtime as needed for sleep.    Historical Provider, MD  venlafaxine (EFFEXOR) 100 MG tablet Take 100 mg by mouth See admin instructions. Take 2 tablets by mouth in the am 1 tablet by mouth in the pm 04/12/11    Historical Provider, MD   BP 127/91 mmHg  Pulse 86  Temp(Src) 97.6 F (36.4 C) (Oral)  Resp 16  SpO2 95% Physical Exam  Constitutional: She is oriented to person, place, and time. She appears well-developed and well-nourished. No distress.  Alert, pleasant  HENT:  Head: Normocephalic and atraumatic.  Cardiovascular: Normal rate, regular rhythm and normal heart sounds.  Exam reveals no gallop and no friction rub.   No murmur heard. Sternotomy scar well healed. Pacer to left upper chest with bandage over top  Pulmonary/Chest: Effort normal and breath sounds normal. No respiratory distress.  Neurological: She is alert and oriented to person, place, and time.  Skin: She is not diaphoretic.  Psychiatric: She has a normal mood and affect. Her behavior is normal. Judgment and thought content normal.  Nursing note and vitals reviewed.   ED Course  Procedures (including critical care time) Labs Review Labs Reviewed - No data to display  Imaging Review No results found.   EKG Interpretation None      MDM   Final diagnoses:  Presence of cardiac pacemaker   64 year old female with recent pacemaker battery change. Presents today with her pacemaker alarm beeping. No other symptoms. No chest pain, shortness of breath, dizziness. Cardiology at the bedside and pacemaker was repeated which fixed the alarm. No other workup per cardiology. Patient is symptom free. We'll discharge to cardiology follow-up.  Larence Penning, MD 01/29/15 2122  Carmin Muskrat, MD 01/31/15 0001

## 2015-01-29 NOTE — Discharge Instructions (Signed)
Pacemaker Implantation, Care After Refer to this sheet over the next few weeks. These instructions provide you with information on caring for yourself after the procedure. Your health care provider may also give you more specific instructions. Your treatment has been planned according to current medical practices, but problems sometimes occur. Call your health care provider if you have any problems or questions regarding your pacemaker.  WHAT TO EXPECT AFTER THE PROCEDURE  You may feel pain. Some pain is normal. It may last a few days.  A slight bump may be seen over the skin where the device was placed. Sometimes, it is possible to feel the device under the skin. This is normal.  In the months and years afterward, your health care provider will check the device, the leads, and the battery every few months. Eventually, when the battery is low, the device will be replaced. HOME CARE INSTRUCTIONS Medicines  Take medicines only as directed by your health care provider.  If you were prescribed an antibiotic medicine, finish it all even if you start to feel better.  Do not take any other medicines without asking your health care provider first. Some medicines, including certain painkillers, can cause bleeding in your stomach after surgery. Wound Care  Do not remove the bandage on your chest until directed to do so by your health care provider.  After your bandage is removed, you may see pieces of tape called skin adhesive strips over the area where the cut was made (incision site). Let them fall off on their own.  Check the incision site every day to make sure it is not infected, bleeding, or starting to pull apart.  Do not use lotions or ointments near the incision site unless directed to do so.  Keep the incision area clean and dry for 2-3 days after the procedure or as directed by your health care provider. It takes several weeks for the incision site to completely heal.  Do not take  baths, swim, or use a hot tub until your health care provider approves. Activities  Try to walk a little every day. Exercising is important after this procedure. It is also important to use your shoulder on the side of the pacemaker in daily tasks that do not require exaggerated motion.  Avoid sudden jerking, pulling, or chopping movements that pull your upper arm far away from your body for at least 6 weeks.  Do not lift your upper arm above your shoulders for at least 6 weeks. This means no tennis, golf, or swimming for this period of time. If you sleep with the arm above your head, use a restraint to prevent this from happening as you sleep.  You may go back to work when your health care provider says it is okay. Check with your health care provider before you start to drive or play sports. Other Instructions  Follow diet instructions if they were provided. You should be able to eat what you usually do right away, but you may need to limit your salt intake.  Weigh yourself every day. If you suddenly gain weight, fluid may be building up in your body.  Always carry your pacemaker identification card with you. The card should list the implant date, device model, and manufacturer. Consider wearing a medical alert bracelet or necklace.  Tell all health care providers that you have a pacemaker. This may prevent them from giving you a magnetic resource imaging scan (MRI) because of the strong magnets used during that test.  If you must pass through a metal detector, quickly walk through it. Do not stop under the detector or stand near it.  Avoid places or objects with a strong electric or magnetic field, including:  Engineer, maintenance. When at the airport, let officials know you have a pacemaker. Your ID card will let you be checked in a way that is safe for you and that will not damage your pacemaker. Also, do not let a security person wave a magnetic wand near your pacemaker. That can make  it stop working.  Power plants.  Large electrical generators.  Radiofrequency transmission towers, such as cell phone and radio towers.  Do not use amateur (ham) radio equipment or electric (arc) welding torches. Some devices are safe to use if held at least 1 foot from your pacemaker. These include power tools, lawn mowers, and speakers. If you are unsure of whether something is safe to use, ask your health care provider.  You may safely use electric blankets, heating pads, computers, and microwave ovens.  When using your cell phone, hold it to the ear opposite the pacemaker. Do not leave your cell phone in a pocket over the pacemaker.  Keep all follow-up visits as directed by your health care provider. This is how your health care provider makes sure your chest is healing the way it should. Ask your health care provider when you should come back to have your stitches or staples taken out.  Have your pacemaker checked every 3-6 months or as directed by your health care provider. Most pacemakers last for 4-8 years before a new one is needed. SEEK MEDICAL CARE IF:  You gain weight suddenly.  Your legs or feet swell more than they have before.  It feels like your heart is fluttering or skipping beats (heart palpitations).  You have a fever. SEEK IMMEDIATE MEDICAL CARE IF:  You have chest pain.  You feel more short of breath than you have felt before.  You feel more light-headed than you have felt before.  You have problems with your incision site, such as swelling or bleeding, or it starts to open up.  You have drainage, redness, swelling, or pain at your incision site. Document Released: 05/14/2005 Document Revised: 03/11/2014 Document Reviewed: 02/25/2012 [facility] Patient Information 2015 [place], [place]. This information is not intended to replace advice given to you by your health care provider. Make sure you discuss any questions you have with your health care provider.

## 2015-01-29 NOTE — ED Notes (Signed)
Patient needed pacemaker "rebooted" per [employer].  Patient to go home per Cardiology.

## 2015-01-29 NOTE — CV Procedure (Signed)
EP Procedure Note  Pre-procedure diagnosis: Ischemic cardiomyopathy, chronic class II systolic heart failure, ejection fraction 35%, status post prior biventricular ICD with the current device at elective replacement  Postprocedure diagnosis: Same as preprocedure  Procedure performed: Removal of a previously implanted biventricular ICD and insertion of a new biventricular ICD  Description of the procedure: After informed consent was obtained, the patient was taken to the diagnostic EP lab in the fasting state. After the usual preparation and draping, intravenous Versed and fentanyl was used for sedation. 30 cc of lidocaine was infiltrated into the left infraclavicular region. A 6 cm incision was carried out. Electrocautery was utilized to dissect down to the ICD pocket. The pocket was very heavily scarred and electrocautery was utilized to free up the dense fibrous adhesions. The device and the leads were removed from the pocket with gentle traction. Electrocautery was utilized to free up some of the scar tissue around the leads. The right atrial, left ventricular, and right ventricular ICD leads were all evaluated and found to be working satisfactorily. The R waves were chronically low around 6 mV. With the satisfactory parameters, the new [employer] biventricular ICD, serial [id number], was connected to the old pacing leads and placed back in the subcutaneous pocket. The pocket was irrigated with antibiotic irrigation. The incision was closed with 2 layers of Vicryl suture. Benzoin and Steri-Strips her pain on the skin. A pressure dressing was applied. The patient was returned to her room in satisfactory condition.  Complications: There were no immediate procedural complications  Conclusion: Successful removal of previously implanted biventricular ICD which had reached elective replacement, and insertion of a new biventricular ICD in a patient with chronic systolic heart failure, and ischemic  cardiomyopathy, and ejection fraction of 35% with left bundle branch block.  Cristopher Peru, M.D.

## 2015-01-29 NOTE — H&P (Signed)
  ICD Criteria  Current LVEF:35% ;Obtained < 1 month ago.  NYHA Functional Classification: Class II  Heart Failure History:  Yes, Duration of heart failure since onset is > 9 months  Non-Ischemic Dilated Cardiomyopathy History:  No.  Atrial Fibrillation/Atrial Flutter:  No.  Ventricular Tachycardia History:  No.  Cardiac Arrest History:  No  History of Syndromes with Risk of Sudden Death:  No.  Previous ICD:  Yes, ICD Type:  CRT-D, Reason for ICD:  Primary prevention.  35%  Electrophysiology Study: No.  Prior MI: Yes, Most recent MI timeframe is > 40 days.  PPM: No.  OSA:  No  Patient Life Expectancy of >=1 year: Yes.  Anticoagulation Therapy:  Patient is NOT on anticoagulation therapy.   Beta Blocker Therapy:  Yes.   Ace Inhibitor/ARB Therapy:  Yes.

## 2015-01-29 NOTE — H&P (Signed)
HPI [name] returns today for followup. She is a very pleasant 64 year old woman with a history of premature coronary disease, and ischemic cardiomyopathy, chronic systolic heart failure, status post ICD implantation. In the interim, the patient notes some dietary indiscretion with sodium. She denies shortness of breath. No ICD shocks. No ICD discharge. She complains of nonexertional chest discomfort. No associated sob or radiation. Yesterday she had chest pressure and took a ntg and had relief. She has reached ERI on her ICD.  Allergies  Allergen Reactions  . Codeine Anaphylaxis  . Digoxin And Related     Unknown  . Diltiazem     Unknown  . Latex     Latex tape  . Metolazone     Unknown  . Morphine Sulfate     Unknown  . Myrbetriq [Mirabegron]     Causes headaches  . Onglyza [Saxagliptin]     Causes migraines  . Penicillins     Unknown  . Septra Ds [Sulfamethoxazole W/Trimethoprim (Co-Trimoxazole)]     Unknown  . Spironolactone     Unknown  . Sulfa Antibiotics Other (See Comments)  . Sulfa Drugs Cross Reactors     Unknown  . Rema Fendt [Diltiazem Hcl]     Unknown  . Tetracyclines & Related     Unknown  . Ticlid [Ticlopidine Hcl]     Unknown  . Verapamil     Unknown  . Vicodin [Hydrocodone-Acetaminophen]     Unknown     Current Outpatient Prescriptions  Medication Sig Dispense Refill  . albuterol (PROVENTIL HFA;VENTOLIN HFA) 108 (90 BASE) MCG/ACT inhaler Inhale 2 puffs into the lungs every 4 (four) hours as needed for wheezing or shortness of breath.    . ALPRAZolam (XANAX) 1 MG tablet Take 0.5-1 mg by mouth 3 (three) times daily as needed (1 tab in the AM, 0.5 tab at noon and 0.5 tab in the PM).     . Artificial Tear GEL Apply 2 drops to eye daily as needed (dry eyes). \    . aspirin 325 MG EC tablet Take 325 mg by mouth daily.      [name] BIOTIN PO Take 10,000 mcg by mouth daily. Biotin plus keratin    . cetirizine (ZYRTEC) 10 MG tablet Take 10 mg by mouth daily.    . Cholecalciferol (VITAMIN D-3) 1000 UNITS CAPS Take 1 capsule by mouth daily.    . clopidogrel (PLAVIX) 75 MG tablet Take 1 tablet by mouth daily 90 tablet 0  . [employer] BLACK COHOSH PO Take 40 mg by mouth daily. Take 2 tabs daily 80 mg total    . doxazosin (CARDURA) 4 MG tablet Take 4 mg by mouth daily.    [name] ezetimibe (ZETIA) 10 MG tablet Take 1 tablet by mouth daily 90 tablet 3  . fluticasone (FLONASE) 50 MCG/ACT nasal spray Place 2 sprays into the nose daily as needed for allergies or rhinitis.     . Fluticasone-Salmeterol (ADVAIR) 100-50 MCG/DOSE AEPB Inhale 1 puff into the lungs daily as needed (shortness or breath or wheezing).     . furosemide (LASIX) 80 MG tablet Take 1 tablet by mouth two times daily 180 tablet 0  . insulin glargine (LANTUS) 100 UNIT/ML injection Inject 18 Units into the skin at bedtime.     . isosorbide mononitrate (IMDUR) 30 MG 24 hr tablet Take 1 tablet by mouth daily 90 tablet 1  . metoprolol (LOPRESSOR) 100 MG tablet Take 1 tablet (100 mg total) by mouth 2 (two)  times daily. 180 tablet 3  . Multiple Vitamin (MULTIVITAMIN WITH MINERALS) TABS Take 1 tablet by mouth daily.    [name] NITROSTAT 0.4 MG SL tablet Place 1 tablet under the tongue every 5 minutes as needed for chest pain, max 3 doses, go to er if no relief 25 tablet 2  . ondansetron (ZOFRAN) 8 MG tablet Take 8 mg by mouth every 8 (eight) hours as needed. For nausea    . pantoprazole (PROTONIX) 40 MG tablet Take 40 mg by mouth daily.    . potassium chloride (K-DUR) 10 MEQ tablet Take 2 tablets (20 mEq total) by mouth 2 (two) times daily. 360 tablet 1  . ramipril (ALTACE) 10 MG capsule Take 1 capsule by mouth twice daily 180 capsule 0  . simvastatin (ZOCOR) 20 MG tablet Take 1 tablet  (20 mg total) by mouth at bedtime. 1 tab daily 90 tablet 3  . sodium chloride (MURO 128) 5 % ophthalmic ointment Place 1 drop into both eyes daily as needed. For dry eyes    . traMADol (ULTRAM) 50 MG tablet Take 100 mg by mouth every 6 (six) hours as needed (pain). For pain    . traZODone (DESYREL) 50 MG tablet Take 25 mg by mouth at bedtime as needed for sleep.    [name] venlafaxine (EFFEXOR) 100 MG tablet Take 2 tablets by mouth in the am 1 tablet by mouth in the pm     No current facility-administered medications for this visit.     Past Medical History  Diagnosis Date  . Ischemic cardiomyopathy     status post biventricular ICD placed by DR Edumunds who used to see Dr Melvern Banker here to establish cardiovascular care.  . Hypertension   . Coronary artery disease   . CHF (congestive heart failure)   . MVP (mitral valve prolapse)     Antibiotics not required for procedures  . Asthma   . Anxiety   . Depression   . Hiatal hernia   . Cervical dysplasia   . Fibroid   . Function kidney decreased   . History of shingles   . Chronic kidney disease   . ICD (implantable cardiac defibrillator) in place   . Pacemaker   . Complication of anesthesia     "I wake up during surgeries" (02/14/2013)  . Anginal pain   . Myocardial infarction     "I've had 2; the others they were able to catch before completing" (02/14/2013)  . Pneumonia 1950's & 1985  . Shortness of breath     "lying down flat; at times w/exertion" (02/14/2013)  . Type II diabetes mellitus   . Iron deficiency anemia   . GERD (gastroesophageal reflux disease)   . History of stomach ulcers   . Migraines   . Stroke     "2 confirmed; 9 TIA's; results in dragging LLE; numbness in tip of tongue" (02/14/2013)    ROS:  All systems reviewed and negative except as noted in the HPI.   Past Surgical History   Procedure Laterality Date  . Insert / replace / remove pacemaker      biventricular defibrillator--06/10/ 2009  . Abdominal hysterectomy  1985    TAH   . Cardiac defibrillator placement    . Coronary angioplasty with stent placement      "started out w/5; bypass corrected some; 1 stent since the bypass" (02/14/2013)  . Breast excisional biopsy Left 01/2007; 06/2007; 03/2008    "benign" (02/14/2013)  . Cardiac catheterization      "  probably in the teens" (02/14/2013)  . Colonoscopy  ~ 2002  . Tee without cardioversion  11/14/2012    Procedure: TRANSESOPHAGEAL ECHOCARDIOGRAM (TEE); Surgeon: Candee Furbish, MD; Location: [facility]; Service: Cardiovascular; Laterality: N/A;  . Supraventricular tachycardia ablation  06/2007  . Biv icd genertaor change out  06/2007; 04/2008    "2 lead initial placement, [facility]; done at [facility], after developing CHF" (02/14/2013)  . Coronary artery bypass graft  ` 1998    "CABG X3" (02/14/2013)  . Breast surgery    . Dilation and curettage of uterus  1975 X 2; 1976; 1977     Family History  Problem Relation Age of Onset  . Heart disease Mother   . Hypertension Mother   . Heart disease Father   . Hypertension Father   . Diabetes Father   . Kidney failure Father   . Heart disease Brother   . Diabetes Brother   . Kidney failure Brother   . Diabetes Paternal Grandmother   . Heart attack Mother   . Heart attack Father   . Heart attack Brother   . Stroke Mother   . Stroke Father      History   Social History  . Marital Status: Married    Spouse Name: [name]  . Number of Children: 1  . Years of Education: MA   Occupational History  .      Disablity   Social History Main Topics  . Smoking status: Never Smoker   . Smokeless tobacco: Never Used  . Alcohol Use: Yes      Comment: 02/14/2013 "glass of wine q blue moon/vacation"  . Drug Use: No  . Sexual Activity: Yes    Birth Control/ Protection: Surgical   Other Topics Concern  . Not on file   Social History Narrative   Patient lives at home with spouse.    Daughter name is Tourist information centre manager.   Caffeine Use: none     BP 142/84 mmHg  Pulse 78  Ht 5' 4.5" (1.638 m)  Wt 176 lb 12.8 oz (80.196 kg)  BMI 29.89 kg/m2  Physical Exam:  Well appearing 64 year-old woman,NAD HEENT: Unremarkable Neck: 6 cm JVD, no thyromegally Back: No CVA tenderness Lungs: Clear with no wheezes, rales, or rhonchi. HEART: Regular rate rhythm, no murmurs, no rubs, no clicks Abd: soft, positive bowel sounds, no organomegally, no rebound, no guarding Ext: 2 plus pulses, no edema, no cyanosis, no clubbing Skin: No rashes no nodules Neuro: CN II through XII intact, motor grossly intact  EKG - Normal sinus rhythm with biventricular pacing  DEVICE  Normal device function. See PaceArt for details. She has reached elective replacement.  Assess/Plan:            1. Ischemic CM 2. Chronic systolic heart failure 3. LBBB 4. S/p BiV ICD now at ERI Rec: I have discussed the risks/benefits/goals/expectations of ICD generator change with the patient and shw wishes to proceed.  Mikle Bosworth.D.

## 2015-01-29 NOTE — Discharge Instructions (Signed)
Pacemaker Battery Change, Care After °Refer to this sheet in the next few weeks. These instructions provide you with information on caring for yourself after your procedure. Your health care provider may also give you more specific instructions. Your treatment has been planned according to current medical practices, but problems sometimes occur. Call your health care provider if you have any problems or questions after your procedure. °WHAT TO EXPECT AFTER THE PROCEDURE °After your procedure, it is typical to have the following sensations: °· Soreness at the pacemaker site. °HOME CARE INSTRUCTIONS  °· Keep the incision clean and dry. °· Unless advised otherwise, you may shower beginning 48 hours after your procedure. °· For the first week after the replacement, avoid stretching motions that pull at the incision site, and avoid heavy exercise with the arm that is on the same side as the incision. °· Take medicines only as directed by your health care provider. °· Keep all follow-up visits as directed by your health care provider. °SEEK MEDICAL CARE IF:  °· You have pain at the incision site that is not relieved by over-the-counter or prescription medicine. °· There is drainage or pus from the incision site. °· There is swelling larger than a lime at the incision site. °· You develop red streaking that extends above or below the incision site. °· You feel brief, intermittent palpitations, light-headedness, or any symptoms that you feel might be related to your heart. °SEEK IMMEDIATE MEDICAL CARE IF:  °· You experience chest pain that is different than the pain at the pacemaker site. °· You experience shortness of breath. °· You have palpitations or irregular heartbeat. °· You have light-headedness that does not go away quickly. °· You faint. °· You have pain that gets worse and is not relieved by medicine. °Document Released: 08/15/2013 Document Revised: 03/11/2014 Document Reviewed: 08/15/2013 °Exit[employer]® Patient  Information ©2015 [employer]. This information is not intended to replace advice given to you by your health care provider. Make sure you discuss any questions you have with your health care provider. ° °

## 2015-01-29 NOTE — Interval H&P Note (Signed)
History and Physical Interval Note:  01/29/2015 2:27 PM  [name]  has presented today for surgery, with the diagnosis of eri/chf  The various methods of treatment have been discussed with the patient and family. After consideration of risks, benefits and other options for treatment, the patient has consented to  Procedure(s): BIV PACEMAKER GENERATOR CHANGE OUT (N/A) as a surgical intervention .  The patient's history has been reviewed, patient examined, no change in status, stable for surgery.  I have reviewed the patient's chart and labs.  Questions were answered to the patient's satisfaction.     Cristopher Peru ,M.D.

## 2015-01-29 NOTE — Progress Notes (Signed)
Patent called in this evening after repeated "siren" like alarms from [employer] CRT-D.  She had a generator change earlier today.  Per Dr. Lovena Le, the device alarmed during the procedure, and the lead showed high impedence.  The lead was re-attached to device, and the interrogation at that time was normal.  Upon arrival to the ED tonight, the [employer] rep interrogated her device and it was found to be functioning normal with no alarms.  However, the alarm that was triggered during implantation had not been reset.  All alarms are now reset, and the patient is being discharged with follow up as previously scheduled.   Stephani Police, MD

## 2015-01-29 NOTE — ED Notes (Signed)
Patient had interrogation done, reset alarms and discharged home per Dr Lovena Le, cardiology.

## 2015-01-29 NOTE — ED Notes (Signed)
Patient here from home.  Patient having alarms going off on newly placed pacemaker.  Patient is CAOx3.

## 2015-01-30 ENCOUNTER — Telehealth: Payer: Self-pay | Admitting: Internal Medicine

## 2015-01-30 NOTE — Telephone Encounter (Signed)
New Message        Pt's daughter calling stating that pt had a cath done yesterday and that the pt has been up all night in pain and nothing she has taken has seemed to help. They are wanting to know what pt should take for the pain.

## 2015-01-30 NOTE — Telephone Encounter (Signed)
Called patient back. Advised patient to take Tylenol Extra Strength for pain and alternate with Ibuprofen, but only for two days. Patient stated she already has some Norco and is it okay to take for pain. Informed patient that she could take her Norco, but not to take Tylenol with the Norco because it has Tylenol already in it. Patient verbalized understanding. Encouraged patient to call back with any other concerns.

## 2015-02-05 ENCOUNTER — Ambulatory Visit (INDEPENDENT_AMBULATORY_CARE_PROVIDER_SITE_OTHER): Payer: Medicare Other | Admitting: *Deleted

## 2015-02-05 DIAGNOSIS — Z95810 Presence of automatic (implantable) cardiac defibrillator: Secondary | ICD-10-CM | POA: Diagnosis not present

## 2015-02-05 DIAGNOSIS — I255 Ischemic cardiomyopathy: Secondary | ICD-10-CM | POA: Diagnosis not present

## 2015-02-05 LAB — MDC_IDC_ENUM_SESS_TYPE_INCLINIC
Battery Voltage: 3.12 V
Brady Statistic AP VS Percent: 0.01 %
Brady Statistic AS VS Percent: 1.31 %
Brady Statistic RA Percent Paced: 0.06 %
HIGH POWER IMPEDANCE MEASURED VALUE: 190 Ohm
HIGH POWER IMPEDANCE MEASURED VALUE: 34 Ohm
HighPow Impedance: 45 Ohm
Lead Channel Impedance Value: 285 Ohm
Lead Channel Impedance Value: 418 Ohm
Lead Channel Impedance Value: 456 Ohm
Lead Channel Impedance Value: 475 Ohm
Lead Channel Pacing Threshold Amplitude: 0.875 V
Lead Channel Pacing Threshold Amplitude: 1.125 V
Lead Channel Pacing Threshold Pulse Width: 0.4 ms
Lead Channel Pacing Threshold Pulse Width: 0.4 ms
Lead Channel Sensing Intrinsic Amplitude: 2.25 mV
Lead Channel Sensing Intrinsic Amplitude: 2.25 mV
Lead Channel Setting Pacing Amplitude: 1.5 V
Lead Channel Setting Pacing Pulse Width: 0.4 ms
Lead Channel Setting Pacing Pulse Width: 0.4 ms
MDC IDC MSMT BATTERY REMAINING LONGEVITY: 117 mo
MDC IDC MSMT LEADCHNL LV IMPEDANCE VALUE: 608 Ohm
MDC IDC MSMT LEADCHNL LV PACING THRESHOLD PULSEWIDTH: 0.4 ms
MDC IDC MSMT LEADCHNL RA PACING THRESHOLD AMPLITUDE: 0.625 V
MDC IDC MSMT LEADCHNL RV SENSING INTR AMPL: 4.25 mV
MDC IDC MSMT LEADCHNL RV SENSING INTR AMPL: 5.125 mV
MDC IDC SESS DTM: 20160330165358
MDC IDC SET LEADCHNL LV PACING AMPLITUDE: 2 V
MDC IDC SET LEADCHNL RV PACING AMPLITUDE: 2 V
MDC IDC SET LEADCHNL RV SENSING SENSITIVITY: 0.3 mV
MDC IDC STAT BRADY AP VP PERCENT: 0.04 %
MDC IDC STAT BRADY AS VP PERCENT: 98.63 %
MDC IDC STAT BRADY RV PERCENT PACED: 0.53 %
Zone Setting Detection Interval: 300 ms
Zone Setting Detection Interval: 350 ms
Zone Setting Detection Interval: 350 ms
Zone Setting Detection Interval: 430 ms

## 2015-02-05 NOTE — Progress Notes (Signed)
Changeout wound check appointment. Steri-strips removed. Wound without redness or edema. Incision edges approximated, wound well healed. Normal device function. Thresholds, sensing, and impedances consistent with implant measurements. Device programmed at chronic lead settings---changed LV output from 2.75 to 2.0V, amplitude safety margin from +1.5 to +1.0V. Histogram distribution appropriate for patient and level of activity. No mode switches or ventricular arrhythmias noted. Patient educated about wound care, arm mobility, lifting restrictions, shock plan. ROV w/ Dr. Lovena Le in 60mo.

## 2015-02-13 DIAGNOSIS — F332 Major depressive disorder, recurrent severe without psychotic features: Secondary | ICD-10-CM | POA: Diagnosis not present

## 2015-02-25 DIAGNOSIS — N189 Chronic kidney disease, unspecified: Secondary | ICD-10-CM | POA: Diagnosis not present

## 2015-02-25 DIAGNOSIS — N183 Chronic kidney disease, stage 3 (moderate): Secondary | ICD-10-CM | POA: Diagnosis not present

## 2015-02-25 DIAGNOSIS — N2581 Secondary hyperparathyroidism of renal origin: Secondary | ICD-10-CM | POA: Diagnosis not present

## 2015-02-26 DIAGNOSIS — D631 Anemia in chronic kidney disease: Secondary | ICD-10-CM | POA: Diagnosis not present

## 2015-02-26 DIAGNOSIS — N183 Chronic kidney disease, stage 3 (moderate): Secondary | ICD-10-CM | POA: Diagnosis not present

## 2015-02-26 DIAGNOSIS — I129 Hypertensive chronic kidney disease with stage 1 through stage 4 chronic kidney disease, or unspecified chronic kidney disease: Secondary | ICD-10-CM | POA: Diagnosis not present

## 2015-02-26 DIAGNOSIS — N2581 Secondary hyperparathyroidism of renal origin: Secondary | ICD-10-CM | POA: Diagnosis not present

## 2015-03-06 ENCOUNTER — Encounter: Payer: Self-pay | Admitting: Internal Medicine

## 2015-03-24 ENCOUNTER — Telehealth: Payer: Self-pay | Admitting: Internal Medicine

## 2015-03-24 DIAGNOSIS — N189 Chronic kidney disease, unspecified: Secondary | ICD-10-CM | POA: Diagnosis not present

## 2015-03-24 DIAGNOSIS — R946 Abnormal results of thyroid function studies: Secondary | ICD-10-CM | POA: Diagnosis not present

## 2015-03-24 DIAGNOSIS — E1165 Type 2 diabetes mellitus with hyperglycemia: Secondary | ICD-10-CM | POA: Diagnosis not present

## 2015-03-24 DIAGNOSIS — I10 Essential (primary) hypertension: Secondary | ICD-10-CM | POA: Diagnosis not present

## 2015-03-24 NOTE — Telephone Encounter (Signed)
Patient states that she thought that she experienced a shock yesterday (5/15) btwn 2am-3am. Manual transmission received, no episodes recorded. She states that she also felt dizzy and weak after the "episode". I encouraged her to contact her PCP regarding the event. Patient voiced understanding.

## 2015-03-24 NOTE — Telephone Encounter (Signed)
Instructed pt to send manual transmission w/ home monitor. Pt agreed to this plan.

## 2015-03-24 NOTE — Telephone Encounter (Signed)
New message       1. Has your device fired? Yes---yesterday between 2am-3am 2. Is you device beeping? no  3. Are you experiencing draining or swelling at device site? no  4. Are you calling to see if we received your device transmission? No 5. Have you passed out? No

## 2015-03-31 DIAGNOSIS — F332 Major depressive disorder, recurrent severe without psychotic features: Secondary | ICD-10-CM | POA: Diagnosis not present

## 2015-04-10 ENCOUNTER — Other Ambulatory Visit: Payer: Self-pay | Admitting: Interventional Cardiology

## 2015-05-06 DIAGNOSIS — F332 Major depressive disorder, recurrent severe without psychotic features: Secondary | ICD-10-CM | POA: Diagnosis not present

## 2015-05-08 ENCOUNTER — Ambulatory Visit (INDEPENDENT_AMBULATORY_CARE_PROVIDER_SITE_OTHER): Payer: Medicare Other | Admitting: Internal Medicine

## 2015-05-08 ENCOUNTER — Encounter: Payer: Self-pay | Admitting: Internal Medicine

## 2015-05-08 VITALS — BP 138/80 | HR 70 | Ht 64.5 in | Wt 171.2 lb

## 2015-05-08 DIAGNOSIS — I471 Supraventricular tachycardia, unspecified: Secondary | ICD-10-CM

## 2015-05-08 DIAGNOSIS — I255 Ischemic cardiomyopathy: Secondary | ICD-10-CM | POA: Diagnosis not present

## 2015-05-08 DIAGNOSIS — Z95810 Presence of automatic (implantable) cardiac defibrillator: Secondary | ICD-10-CM

## 2015-05-08 DIAGNOSIS — I200 Unstable angina: Secondary | ICD-10-CM

## 2015-05-08 LAB — CUP PACEART INCLINIC DEVICE CHECK
Battery Voltage: 3.1 V
Brady Statistic AP VP Percent: 0.12 %
Brady Statistic AP VS Percent: 0.01 %
Brady Statistic RA Percent Paced: 0.13 %
Brady Statistic RV Percent Paced: 0.75 %
HIGH POWER IMPEDANCE MEASURED VALUE: 190 Ohm
HIGH POWER IMPEDANCE MEASURED VALUE: 39 Ohm
HighPow Impedance: 48 Ohm
Lead Channel Impedance Value: 285 Ohm
Lead Channel Impedance Value: 456 Ohm
Lead Channel Impedance Value: 475 Ohm
Lead Channel Impedance Value: 513 Ohm
Lead Channel Pacing Threshold Amplitude: 0.75 V
Lead Channel Pacing Threshold Amplitude: 1 V
Lead Channel Pacing Threshold Amplitude: 1.125 V
Lead Channel Pacing Threshold Pulse Width: 0.4 ms
Lead Channel Pacing Threshold Pulse Width: 0.4 ms
Lead Channel Sensing Intrinsic Amplitude: 5.625 mV
Lead Channel Setting Pacing Amplitude: 2 V
Lead Channel Setting Pacing Amplitude: 2.25 V
Lead Channel Setting Pacing Pulse Width: 0.4 ms
Lead Channel Setting Sensing Sensitivity: 0.3 mV
MDC IDC MSMT BATTERY REMAINING LONGEVITY: 115 mo
MDC IDC MSMT LEADCHNL LV IMPEDANCE VALUE: 646 Ohm
MDC IDC MSMT LEADCHNL LV PACING THRESHOLD PULSEWIDTH: 0.4 ms
MDC IDC MSMT LEADCHNL RA SENSING INTR AMPL: 2.875 mV
MDC IDC SESS DTM: 20160630183541
MDC IDC SET LEADCHNL RA PACING AMPLITUDE: 1.5 V
MDC IDC SET LEADCHNL RV PACING PULSEWIDTH: 0.4 ms
MDC IDC SET ZONE DETECTION INTERVAL: 350 ms
MDC IDC STAT BRADY AS VP PERCENT: 98.51 %
MDC IDC STAT BRADY AS VS PERCENT: 1.36 %
Zone Setting Detection Interval: 300 ms
Zone Setting Detection Interval: 350 ms
Zone Setting Detection Interval: 430 ms

## 2015-05-08 NOTE — Progress Notes (Signed)
HPI [name] returns today for followup. She is a very pleasant 64 year old woman with a history of premature coronary disease, and ischemic cardiomyopathy, chronic systolic heart failure, status post ICD implantation. In the interim, the patient notes some dietary indiscretion with sodium. She denies shortness of breath. No ICD shocks.  No ICD discharge. She complains of nonexertional chest discomfort. No associated sob or radiation.  Yesterday she had chest pressure and took a ntg and had relief. She has undergone ICD generator change and is doing well. She states that she is planning on taking a cruise to [place] in a few weeks.   Allergies  Allergen Reactions  . Codeine Anaphylaxis  . Sulfa Antibiotics     Swelling, hives  . Sulfa Drugs Cross Reactors     Swelling, hives  . Digoxin And Related     Unknown  . Diltiazem     Unknown  . Latex     Latex tape  . Metolazone     Unknown  . Morphine Sulfate     Unknown  . Myrbetriq [Mirabegron]     Causes headaches  . Onglyza [Saxagliptin]     Causes migraines  . Penicillins     Unknown  . Septra Ds [Sulfamethoxazole W/Trimethoprim (Co-Trimoxazole)]     Unknown  . Spironolactone     Unknown  . Rema Fendt [Diltiazem Hcl]     Unknown  . Tetracyclines & Related     Unknown  . Ticlid [Ticlopidine Hcl]     Unknown  . Verapamil     Unknown  . Vicodin [Hydrocodone-Acetaminophen]     Unknown     Current Outpatient Prescriptions  Medication Sig Dispense Refill  . albuterol (PROVENTIL HFA;VENTOLIN HFA) 108 (90 BASE) MCG/ACT inhaler Inhale 2 puffs into the lungs every 4 (four) hours as needed for wheezing or shortness of breath.    . ALPRAZolam (XANAX) 1 MG tablet Take 0.5-1 mg by mouth 3 (three) times daily as needed (1 tab in the AM, 0.5 tab at noon and 0.5 tab in the PM).     . Artificial Tear GEL Apply 2 drops to eye daily as needed (dry eyes). \    . aspirin 325 MG EC tablet Take 325 mg by mouth daily.      [name]  BIOTIN PO Take 10,000 mcg by mouth daily. Biotin plus keratin    . cetirizine (ZYRTEC) 10 MG tablet Take 10 mg by mouth daily.    . Cholecalciferol (VITAMIN D-3) 1000 UNITS CAPS Take 1 capsule by mouth daily.    . clopidogrel (PLAVIX) 75 MG tablet Take 1 tablet by mouth  daily 90 tablet 3  . [facility] BLACK COHOSH PO Take 40 mg by mouth as directed. Take 2 tabs daily 80 mg  total    . doxazosin (CARDURA) 4 MG tablet Take 4 mg by mouth daily.    [name] ezetimibe (ZETIA) 10 MG tablet Take 1 tablet by mouth  daily 90 tablet 3  . fluticasone (FLONASE) 50 MCG/ACT nasal spray Place 2 sprays into the nose daily as needed for allergies or rhinitis.     . Fluticasone-Salmeterol (ADVAIR) 100-50 MCG/DOSE AEPB Inhale 1 puff into the lungs daily as needed (shortness or breath or wheezing).     . furosemide (LASIX) 80 MG tablet Take 1 tablet by mouth two  times daily 180 tablet 3  . insulin glargine (LANTUS) 100 UNIT/ML injection Inject 20 Units into the skin at bedtime.     [name]  isosorbide mononitrate (IMDUR) 30 MG 24 hr tablet Take 1 tablet by mouth  daily 90 tablet 3  . metoprolol (LOPRESSOR) 100 MG tablet Take 1 tablet (100 mg total) by mouth 2 (two) times daily. 180 tablet 3  . Multiple Vitamin (MULTIVITAMIN WITH MINERALS) TABS Take 1 tablet by mouth daily.    [name] NITROSTAT 0.4 MG SL tablet Place 1 tablet under the tongue every 5 minutes as needed for chest pain, max 3 doses, go to er if no relief 25 tablet 2  . omeprazole (PRILOSEC) 40 MG capsule Take 40 mg by mouth daily.    . ondansetron (ZOFRAN) 8 MG tablet Take 8 mg by mouth every 8 (eight) hours as needed. For nausea    . pantoprazole (PROTONIX) 40 MG tablet Take 40 mg by mouth daily.    . potassium chloride (K-DUR) 10 MEQ tablet Take 2 tablets (20 mEq total) by mouth 2 (two) times daily. 360 tablet 1  . ramipril (ALTACE) 10 MG capsule Take 1 capsule by mouth  twice daily 180 capsule 3  . simvastatin (ZOCOR) 20 MG tablet Take 20 mg by mouth at bedtime.    . sodium  chloride (MURO 128) 5 % ophthalmic ointment Place 1 drop into both eyes daily as needed. For dry eyes    . traMADol (ULTRAM) 50 MG tablet Take 100 mg by mouth every 6 (six) hours as needed (pain). For pain    . traZODone (DESYREL) 50 MG tablet Take 25 mg by mouth at bedtime as needed for sleep.    [name] venlafaxine (EFFEXOR) 100 MG tablet Take 100 mg by mouth See admin instructions. Take 2 tablets by mouth in the am 1 tablet by mouth in the pm     No current facility-administered medications for this visit.     Past Medical History  Diagnosis Date  . Ischemic cardiomyopathy     status post biventricular ICD placed by DR Edumunds who used to see Dr Melvern Banker here to establish  cardiovascular care.  . Hypertension   . Coronary artery disease   . CHF (congestive heart failure)   . MVP (mitral valve prolapse)     Antibiotics not required for procedures  . Asthma   . Anxiety   . Depression   . Hiatal hernia   . Cervical dysplasia   . Fibroid   . Function kidney decreased   . History of shingles   . Chronic kidney disease   . ICD (implantable cardiac defibrillator) in place   . Pacemaker   . Complication of anesthesia     "I wake up during surgeries" (02/14/2013)  . Anginal pain   . Myocardial infarction     "I've had 2; the others they were able to catch before completing" (02/14/2013)  . Pneumonia 1950's & 1985  . Shortness of breath     "lying down flat; at times w/exertion" (02/14/2013)  . Type II diabetes mellitus   . Iron deficiency anemia   . GERD (gastroesophageal reflux disease)   . History of stomach ulcers   . Migraines   . Stroke     "2 confirmed; 9 TIA's; results in dragging LLE; numbness in tip of tongue" (02/14/2013)    ROS:   All systems reviewed and negative except as noted in the HPI.   Past Surgical History  Procedure Laterality Date  . Insert / replace / remove pacemaker      biventricular defibrillator--06/10/ 2009  . Abdominal hysterectomy  1985  TAH   .  Cardiac defibrillator placement    . Coronary angioplasty with stent placement      "started out w/5; bypass corrected some; 1 stent since the bypass" (02/14/2013)  . Breast excisional biopsy Left 01/2007; 06/2007; 03/2008    "benign" (02/14/2013)  . Cardiac catheterization      "probably in the teens" (02/14/2013)  . Colonoscopy  ~ 2002  . Tee without cardioversion  11/14/2012    Procedure: TRANSESOPHAGEAL ECHOCARDIOGRAM (TEE);  Surgeon: Candee Furbish, MD;  Location: [facility] ENDOSCOPY;  Service: Cardiovascular;  Laterality: N/A;  . Supraventricular tachycardia ablation  06/2007  . Biv icd genertaor change out  06/2007; 04/2008    "2 lead initial placement, at [facility]; done at [facility], after developing CHF" (02/14/2013)  . Coronary artery bypass graft  ` 1998    LIMA-LAD, SVG-D1, SVG-PDA  . Breast surgery    . Dilation and curettage of uterus  1975 X 2; 1976; 1977  . Left heart catheterization with coronary/graft angiogram N/A 01/03/2015    Procedure: LEFT HEART CATHETERIZATION WITH Beatrix Fetters;  Surgeon: Sinclair Grooms, MD;  Location: [facility] CATH LAB;  Service: Cardiovascular;  Laterality: N/A;  . Biv pacemaker generator change out N/A 01/29/2015    Procedure: BIV PACEMAKER GENERATOR CHANGE OUT;  Surgeon: Evans Lance, MD;  Location: [facility] CATH LAB;  Service: Cardiovascular;  Laterality: N/A;     Family History  Problem Relation Age of Onset  . Heart disease Mother   . Hypertension Mother   . Heart disease Father   . Hypertension Father   . Diabetes Father   . Kidney failure Father   . Heart disease Brother   . Diabetes Brother   . Kidney failure Brother   . Diabetes Paternal Grandmother   . Heart attack Mother   . Heart attack Father   . Heart attack Brother   . Stroke Mother   . Stroke Father      History   Social History  . Marital Status: Married    Spouse Name: [name]  . Number of Children: 1  . Years of Education: MA   Occupational History  .      Disablity   Social History  Main Topics  . Smoking status: Never Smoker   . Smokeless tobacco: Never Used  . Alcohol Use: Yes     Comment: 02/14/2013 "glass of wine q blue moon/vacation"  . Drug Use: No  . Sexual Activity: Yes    Birth Control/ Protection: Surgical   Other Topics Concern  . Not on file   Social History Narrative   Patient lives at home with spouse.     Daughter name is Tourist information centre manager.   Caffeine Use: none     BP 138/80 mmHg  Pulse 70  Ht 5' 4.5" (1.638 m)  Wt 171 lb 3.2 oz (77.656 kg)  BMI 28.94 kg/m2  Physical Exam:  Well appearing 64 year-old woman,NAD HEENT: Unremarkable Neck:  6 cm JVD, no thyromegally Back:  No CVA tenderness Lungs:  Clear with no wheezes, rales, or rhonchi. Well healed ICD incision.   HEART:  Regular rate rhythm, no murmurs, no rubs, no clicks Abd:  soft, positive bowel sounds, no organomegally, no rebound, no guarding Ext:  2 plus pulses, no edema, no cyanosis, no clubbing Skin:  No rashes no nodules Neuro:  CN II through XII intact, motor grossly intact  EKG -  Normal sinus rhythm with biventricular pacing  DEVICE  Normal device function.  See PaceArt for details.   Assess/Plan:

## 2015-05-08 NOTE — Assessment & Plan Note (Signed)
She denies anginal symptoms. No change in medications.

## 2015-05-08 NOTE — Patient Instructions (Signed)
Medication Instructions:  Your physician recommends that you continue on your current medications as directed. Please refer to the Current Medication list given to you today.   Labwork: None ordered  Testing/Procedures: None ordered  Follow-Up: Your physician wants you to follow-up in: 12 months with Dr Knox Saliva will receive a reminder letter in the mail two months in advance. If you don't receive a letter, please call our office to schedule the follow-up appointment.  Remote monitoring is used to monitor your Pacemaker or ICD from home. This monitoring reduces the number of office visits required to check your device to one time per year. It allows [place] to keep an eye on the functioning of your device to ensure it is working properly. You are scheduled for a device check from home on 08/07/15. You may send your transmission at any time that day. If you have a wireless device, the transmission will be sent automatically. After your physician reviews your transmission, you will receive a postcard with your next transmission date.     Any Other Special Instructions Will Be Listed Below (If Applicable).

## 2015-05-08 NOTE — Assessment & Plan Note (Signed)
She has rare episodes lasting seconds with HR's in the 100 range. Will follow. She is asymptomatic.

## 2015-05-08 NOTE — Assessment & Plan Note (Signed)
Her medtronic Biv device is working normally. Will recheck in several months.

## 2015-05-19 DIAGNOSIS — Z95810 Presence of automatic (implantable) cardiac defibrillator: Secondary | ICD-10-CM | POA: Diagnosis not present

## 2015-05-19 DIAGNOSIS — J4520 Mild intermittent asthma, uncomplicated: Secondary | ICD-10-CM | POA: Diagnosis not present

## 2015-05-19 DIAGNOSIS — E1165 Type 2 diabetes mellitus with hyperglycemia: Secondary | ICD-10-CM | POA: Diagnosis not present

## 2015-05-19 DIAGNOSIS — E780 Pure hypercholesterolemia: Secondary | ICD-10-CM | POA: Diagnosis not present

## 2015-05-19 DIAGNOSIS — Z794 Long term (current) use of insulin: Secondary | ICD-10-CM | POA: Diagnosis not present

## 2015-05-19 DIAGNOSIS — N183 Chronic kidney disease, stage 3 (moderate): Secondary | ICD-10-CM | POA: Diagnosis not present

## 2015-05-19 DIAGNOSIS — I10 Essential (primary) hypertension: Secondary | ICD-10-CM | POA: Diagnosis not present

## 2015-06-05 ENCOUNTER — Encounter: Payer: Self-pay | Admitting: Gynecology

## 2015-06-05 ENCOUNTER — Ambulatory Visit (INDEPENDENT_AMBULATORY_CARE_PROVIDER_SITE_OTHER): Payer: Medicare Other | Admitting: Gynecology

## 2015-06-05 VITALS — BP 134/76 | Ht 64.0 in | Wt 170.0 lb

## 2015-06-05 DIAGNOSIS — Z01419 Encounter for gynecological examination (general) (routine) without abnormal findings: Secondary | ICD-10-CM | POA: Diagnosis not present

## 2015-06-05 DIAGNOSIS — N952 Postmenopausal atrophic vaginitis: Secondary | ICD-10-CM | POA: Diagnosis not present

## 2015-06-05 DIAGNOSIS — Z780 Asymptomatic menopausal state: Secondary | ICD-10-CM | POA: Diagnosis not present

## 2015-06-05 MED ORDER — ESTRADIOL 10 MCG VA TABS: 1.0000 | ORAL_TABLET | VAGINAL | Status: DC

## 2015-06-05 NOTE — Patient Instructions (Addendum)
Estradiol vaginal tablets What is this medicine? ESTRADIOL (es tra DYE ole) vaginal tablet is used to help relieve symptoms of vaginal irritation and dryness that occurs in some women during menopause. This medicine may be used for other purposes; ask your health care provider or pharmacist if you have questions. COMMON BRAND NAME(S): Vagifem What should I tell my health care provider before I take this medicine? They need to know if you have any of these conditions: -abnormal vaginal bleeding -blood vessel disease or blood clots -breast, cervical, endometrial, ovarian, liver, or uterine cancer -dementia -diabetes -gallbladder disease -heart disease or recent heart attack -high blood pressure -high cholesterol -high level of calcium in the blood -hysterectomy -kidney disease -liver disease -migraine headaches -protein C deficiency -protein S deficiency -stroke -systemic lupus erythematosus (SLE) -tobacco smoker -an unusual or allergic reaction to estrogens, other hormones, medicines, foods, dyes, or preservatives -pregnant or trying to get pregnant -breast-feeding How should I use this medicine? This medicine is only for use in the vagina. Do not take by mouth. Wash your hands before and after use. Read package directions carefully. Unwrap the pre-filled applicator package. Lie on your back, part and bend your knees. Gently insert the applicator tip high in the vagina and push the plunger to release the tablet into the vagina. Gently remove the applicator. Throw away the applicator after use. Do not use your medicine more often than directed. Finish the full course prescribed by your doctor or health care professional even if you think your condition is better. Do not stop using except on the advice of your doctor or health care professional. Talk to your pediatrician regarding the use of this medicine in children. A patient package insert for the product will be given with each  prescription and refill. Read this sheet carefully each time. The sheet may change frequently. Overdosage: If you think you have taken too much of this medicine contact a poison control center or emergency room at once. NOTE: This medicine is only for you. Do not share this medicine with others. What if I miss a dose? If you miss a dose, take it as soon as you can. If it is almost time for your next dose, take only that dose. Do not take double or extra doses. What may interact with this medicine? Do not take this medicine with any of the following medications: -aromatase inhibitors like aminoglutethimide, anastrozole, exemestane, letrozole, testolactone This medicine may also interact with the following medications: -antibiotics used to treat tuberculosis like rifabutin, rifampin and rifapentene -raloxifene or tamoxifen -warfarin This list may not describe all possible interactions. Give your health care provider a list of all the medicines, herbs, non-prescription drugs, or dietary supplements you use. Also tell them if you smoke, drink alcohol, or use illegal drugs. Some items may interact with your medicine. What should I watch for while using this medicine? Visit your health care professional for regular checks on your progress. You will need a regular breast and pelvic exam. You should also discuss the need for regular mammograms with your health care professional, and follow his or her guidelines. This medicine can make your body retain fluid, making your fingers, hands, or ankles swell. Your blood pressure can go up. Contact your doctor or health care professional if you feel you are retaining fluid. If you have any reason to think you are pregnant; stop taking this medicine at once and contact your doctor or health care professional. Tobacco smoking increases the risk of getting  a blood clot or having a stroke, especially if you are more than 62 years old. You are strongly advised not to  smoke. If you wear contact lenses and notice visual changes, or if the lenses begin to feel uncomfortable, consult your eye care specialist. If you are going to have elective surgery, you may need to stop taking this medicine beforehand. Consult your health care professional for advice prior to scheduling the surgery. What side effects may I notice from receiving this medicine? Side effects that you should report to your doctor or health care professional as soon as possible: -allergic reactions like skin rash, itching or hives, swelling of the face, lips, or tongue -breast tissue changes or discharge -changes in vision -chest pain -confusion, trouble speaking or understanding -dark urine -general ill feeling or flu-like symptoms -light-colored stools -nausea, vomiting -pain, swelling, warmth in the leg -right upper belly pain -severe headaches -shortness of breath -sudden numbness or weakness of the face, arm or leg -trouble walking, dizziness, loss of balance or coordination -unusual vaginal bleeding -yellowing of the eyes or skin Side effects that usually do not require medical attention (report to your doctor or health care professional if they continue or are bothersome): -hair loss -increased hunger or thirst -increased urination -symptoms of vaginal infection like itching, irritation or unusual discharge -unusually weak or tired This list may not describe all possible side effects. Call your doctor for medical advice about side effects. You may report side effects to FDA at 1-800-FDA-1088. Where should I keep my medicine? Keep out of the reach of children. Store at room temperature between 15 and 30 degrees C (59 and 86 degrees F). Throw away any unused medicine after the expiration date. NOTE: This sheet is a summary. It may not cover all possible information. If you have questions about this medicine, talk to your doctor, pharmacist, or health care provider.  2015,  Elsevier/Gold Standard. (2011-01-27 09:08:58) Bone Densitometry Bone densitometry is a special X-ray that measures your bone density and can be used to help predict your risk of bone fractures. This test is used to determine bone mineral content and density to diagnose osteoporosis. Osteoporosis is the loss of bone that may cause the bone to become weak. Osteoporosis commonly occurs in women entering menopause. However, it may be found in men and in people with other diseases. PREPARATION FOR TEST No preparation necessary. WHO SHOULD BE TESTED?  All women older than 17.  Postmenopausal women (50 to 1) with risk factors for osteoporosis.  People with a previous fracture caused by normal activities.  People with a small body frame (less than 127 poundsor a body mass index [BMI] of less than 21).  People who have a parent with a hip fracture or history of osteoporosis.  People who smoke.  People who have rheumatoid arthritis.  Anyone who engages in excessive alcohol use (more than 3 drinks most days).  Women who experience early menopause. WHEN SHOULD YOU BE RETESTED? Current guidelines suggest that you should wait at least 2 years before doing a bone density test again if your first test was normal.Recent studies indicated that women with normal bone density may be able to wait a few years before needing to repeat a bone density test. You should discuss this with your caregiver.  NORMAL FINDINGS   Normal: less than standard deviation below normal (greater than -1).  Osteopenia: 1 to 2.5 standard deviations below normal (-1 to -2.5).  Osteoporosis: greater than 2.5 standard deviations below  normal (less than -2.5). Test results are reported as a "T score" and a "Z score."The T score is a number that compares your bone density with the bone density of healthy, young women.The Z score is a number that compares your bone density with the scores of women who are the same age, gender,  and race.  Ranges for normal findings may vary among different laboratories and hospitals. You should always check with your doctor after having lab work or other tests done to discuss the meaning of your test results and whether your values are considered within normal limits. MEANING OF TEST  Your caregiver will go over the test results with you and discuss the importance and meaning of your results, as well as treatment options and the need for additional tests if necessary. OBTAINING THE TEST RESULTS It is your responsibility to obtain your test results. Ask the lab or department performing the test when and how you will get your results. Document Released: 11/16/2004 Document Revised: 01/17/2012 Document Reviewed: 12/09/2010 [facility] Patient Information 2015 [place], [place]. This information is not intended to replace advice given to you by your health care provider. Make sure you discuss any questions you have with your health care provider.

## 2015-06-05 NOTE — Progress Notes (Signed)
[name] 04/03/1951 [id number]   History:    64 y.o.  for annual gyn exam who is complaining of vaginal dryness and irritation and is not that frequently sexually active with her husband because of medical necessity suffers from.Patient with past history of total abdominal hysterectomy in 1984 for endometriosis. Prior to that she reports normal Pap smears in the past. Patient has past history of triple bypass. She also had a left breast biopsy in the past which was benign. She's had cardiac catheterization in 2004 in 2005 respectively. She also had a breast biopsy in 2008 as well. Her last mammogram was reported to be normal 3 dimension as a result of rest being dense in July of 2015. Her PCP is Dr. [name] has been doing her blood work. Her colonoscopy was 4 years ago reported to be normal. Also at another facility she reports having had a normal bone density study in 2009.    Past medical history,surgical history, family history and social history were all reviewed and documented in the EPIC chart.  Gynecologic History No LMP recorded. Patient has had a hysterectomy. Contraception: status post hysterectomy Last Pap: 2013. Results were: normal Last mammogram: 2015. Results were: Normal but dense had three-dimensional mammogram  Obstetric History OB History  Gravida Para Term Preterm AB SAB TAB Ectopic Multiple Living  7 2  2 5     1     # Outcome Date GA Lbr Len/2nd Weight Sex Delivery Anes PTL Lv  7 AB           6 AB           5 AB           4 AB           3 AB           2 Preterm           1 Preterm                ROS: A ROS was performed and pertinent positives and negatives are included in the history.  GENERAL: No fevers or chills. HEENT: No change in vision, no earache, sore throat or sinus congestion. NECK: No pain or stiffness. CARDIOVASCULAR: No chest pain or pressure. No palpitations. PULMONARY: No shortness of breath, cough or wheeze. GASTROINTESTINAL:  No abdominal pain, nausea, vomiting or diarrhea, melena or bright red blood per rectum. GENITOURINARY: No urinary frequency, urgency, hesitancy or dysuria. MUSCULOSKELETAL: No joint or muscle pain, no back pain, no recent trauma. DERMATOLOGIC: No rash, no itching, no lesions. ENDOCRINE: No polyuria, polydipsia, no heat or cold intolerance. No recent change in weight. HEMATOLOGICAL: No anemia or easy bruising or bleeding. NEUROLOGIC: No headache, seizures, numbness, tingling or weakness. PSYCHIATRIC: No depression, no loss of interest in normal activity or change in sleep pattern.     Exam: chaperone present  BP 134/76 mmHg  Ht 5\' 4"  (1.626 m)  Wt 170 lb (77.111 kg)  BMI 29.17 kg/m2  Body mass index is 29.17 kg/(m^2).  General appearance : Well developed well nourished female. No acute distress HEENT: Eyes: no retinal hemorrhage or exudates,  Neck supple, trachea midline, no carotid bruits, no thyroidmegaly Lungs: Clear to auscultation, no rhonchi or wheezes, or rib retractions  Heart: Regular rate and rhythm, no murmurs or gallops Breast:Examined in sitting and supine position were symmetrical in appearance, no palpable masses or tenderness,  no skin retraction, no nipple inversion, no nipple discharge, no  skin discoloration, no axillary or supraclavicular lymphadenopathy Abdomen: no palpable masses or tenderness, no rebound or guarding Extremities: no edema or skin discoloration or tenderness  Pelvic:  Bartholin, Urethra, Skene Glands: Within normal limits             Vagina: No gross lesions or discharge, atrophic changes  Cervix: Absent  Uterus  absent  Adnexa  Without masses or tenderness  Anus and perineum  normal   Rectovaginal  normal sphincter tone without palpated masses or tenderness             Hemoccult to schedule colonoscopy this year     Assessment/Plan:  64 y.o. female for annual exam with past history of colon polyps approximate 5 years ago. Patient will call her  gastroenterologist and schedule the appointment for this year. Pap smear no longer needed according to the new guidelines. Patient to schedule three-dimensional mammogram later this year because of dense breasts. Patient to schedule bone density study here in the office in the next few months. We discussed importance of calcium vitamin D and regular exercise for osteoporosis prevention. PCP will be doing her blood work. For her vaginal atrophy she will be prescribed Vagifem 10 g to apply one tablet intravaginally twice weekly to help with her symptoms. We discussed the risks benefits and pros and cons and the recent literature information indicating there is minimal if any absorption.   Terrance Mass MD, 5:35 PM 06/05/2015

## 2015-06-06 ENCOUNTER — Other Ambulatory Visit: Payer: Self-pay

## 2015-06-06 DIAGNOSIS — Z1231 Encounter for screening mammogram for malignant neoplasm of breast: Secondary | ICD-10-CM

## 2015-06-10 DIAGNOSIS — K5900 Constipation, unspecified: Secondary | ICD-10-CM | POA: Diagnosis not present

## 2015-06-10 DIAGNOSIS — K449 Diaphragmatic hernia without obstruction or gangrene: Secondary | ICD-10-CM | POA: Diagnosis not present

## 2015-06-10 DIAGNOSIS — K219 Gastro-esophageal reflux disease without esophagitis: Secondary | ICD-10-CM | POA: Diagnosis not present

## 2015-06-10 DIAGNOSIS — Z1211 Encounter for screening for malignant neoplasm of colon: Secondary | ICD-10-CM | POA: Diagnosis not present

## 2015-06-16 ENCOUNTER — Ambulatory Visit (INDEPENDENT_AMBULATORY_CARE_PROVIDER_SITE_OTHER): Payer: Medicare Other

## 2015-06-16 ENCOUNTER — Other Ambulatory Visit: Payer: Self-pay | Admitting: Gynecology

## 2015-06-16 DIAGNOSIS — Z780 Asymptomatic menopausal state: Secondary | ICD-10-CM

## 2015-06-18 DIAGNOSIS — F411 Generalized anxiety disorder: Secondary | ICD-10-CM | POA: Diagnosis not present

## 2015-06-19 DIAGNOSIS — Z1231 Encounter for screening mammogram for malignant neoplasm of breast: Secondary | ICD-10-CM | POA: Diagnosis not present

## 2015-06-23 ENCOUNTER — Encounter: Payer: Self-pay | Admitting: Gynecology

## 2015-06-24 DIAGNOSIS — Z1212 Encounter for screening for malignant neoplasm of rectum: Secondary | ICD-10-CM | POA: Diagnosis not present

## 2015-06-24 DIAGNOSIS — Z1211 Encounter for screening for malignant neoplasm of colon: Secondary | ICD-10-CM | POA: Diagnosis not present

## 2015-07-09 ENCOUNTER — Telehealth: Payer: Self-pay | Admitting: *Deleted

## 2015-07-09 NOTE — Telephone Encounter (Signed)
Pt aware to come have vitamin d, PTH, calcium level checked per dexa report. Pt will call back and schedule lab appointment as she will be out of the country for 1 month.

## 2015-07-10 ENCOUNTER — Ambulatory Visit: Payer: Medicare Other | Admitting: Interventional Cardiology

## 2015-07-24 ENCOUNTER — Telehealth: Payer: Self-pay | Admitting: Interventional Cardiology

## 2015-07-24 DIAGNOSIS — I5022 Chronic systolic (congestive) heart failure: Secondary | ICD-10-CM

## 2015-07-24 NOTE — Telephone Encounter (Signed)
New message      Pt c/o swelling: STAT is pt has developed SOB within 24 hours  1. How long have you been experiencing swelling?  approx 1 week  2. Where is the swelling located? Feet,ankle,up to knee---both legs----painful to walk  3.  Are you currently taking a "fluid pill"? Yes and took extra lasix while on vacation and last night  4.  Are you currently SOB? Not now but have been since she was out of the country  5.  Have you traveled recently? Yes---7hr flight on tues; 14hr flight on wed----pt has been out of town for about 3 weeks (went to [place], went on cruise ship for 11 days--lots of walking)

## 2015-07-24 NOTE — Telephone Encounter (Signed)
Returned pt call. Pt sts that she has been traveling out of the country for 3 weeks and returned home last night. Pt sts that she is currently taking lasix 80mg  bid and an extra 80mg  when she has swelling. Pt sts that she took an extra lasix almost daily while traveling.pt sts that when she got home last night both legs were swollen and painful. She denies sob, pt does not weigh regularly and has not measured her bp lately. This morning she did take 160mg  of lasix. She has elevated her legs and has had great improvement with LE swelling. Adv Pt I will update Dr.Smith and call back with his recommendation. Pt voiced appreciation and verbalized understanding.  Pt aware of Dr.Smith's recommendations. Take 80mg  of lasix this evening. Take 160mg  of lasix tomorrow morning. Come to the office tomorrow for labs tomorrow (bmet,bnp). Adv pt that it is important the she weighs daily under the same circumstances. She should record her weights. Adv her that is the best way to monitor fluid retention. Pt sts that she has a scale at home and will start to weigh daily.  Pt agreeable with plan and verbalized understanding

## 2015-07-25 ENCOUNTER — Other Ambulatory Visit: Payer: Medicare Other

## 2015-07-25 ENCOUNTER — Other Ambulatory Visit (INDEPENDENT_AMBULATORY_CARE_PROVIDER_SITE_OTHER): Payer: Medicare Other | Admitting: *Deleted

## 2015-07-25 ENCOUNTER — Other Ambulatory Visit: Payer: Self-pay | Admitting: *Deleted

## 2015-07-25 DIAGNOSIS — M8580 Other specified disorders of bone density and structure, unspecified site: Secondary | ICD-10-CM

## 2015-07-25 DIAGNOSIS — I5022 Chronic systolic (congestive) heart failure: Secondary | ICD-10-CM

## 2015-07-25 LAB — BASIC METABOLIC PANEL
BUN: 12 mg/dL (ref 6–23)
CALCIUM: 9.2 mg/dL (ref 8.4–10.5)
CO2: 36 meq/L — AB (ref 19–32)
Chloride: 100 mEq/L (ref 96–112)
Creatinine, Ser: 1.57 mg/dL — ABNORMAL HIGH (ref 0.40–1.20)
GFR: 42.92 mL/min — ABNORMAL LOW (ref >60.00)
GLUCOSE: 78 mg/dL (ref 70–99)
POTASSIUM: 3.8 meq/L (ref 3.5–5.1)
Sodium: 143 mEq/L (ref 135–145)

## 2015-07-25 LAB — BRAIN NATRIURETIC PEPTIDE: Pro B Natriuretic peptide (BNP): 104 pg/mL — ABNORMAL HIGH (ref 0.0–100.0)

## 2015-07-26 LAB — VITAMIN D 25 HYDROXY (VIT D DEFICIENCY, FRACTURES): VIT D 25 HYDROXY: 40 ng/mL (ref 30–100)

## 2015-07-28 ENCOUNTER — Telehealth: Payer: Self-pay | Admitting: *Deleted

## 2015-07-28 DIAGNOSIS — R7989 Other specified abnormal findings of blood chemistry: Secondary | ICD-10-CM

## 2015-07-28 LAB — PTH, INTACT AND CALCIUM
Calcium: 9.2 mg/dL (ref 8.4–10.5)
PTH: 146 pg/mL — ABNORMAL HIGH (ref 14–64)

## 2015-07-28 NOTE — Telephone Encounter (Signed)
Per JF  Result note make an appointment as well for with Dr.Gherge due to elevated PTH level. They will call patient to schedule.

## 2015-07-28 NOTE — Telephone Encounter (Signed)
Pt aware of lab results. Labs are good.  Pt sts that he LE swelling has resolved. She has begin weighing and recording her weights daily. Pt adv to call the office if weight is up 3lbs in a day 5lbs in a week. Pt verbalized understanding

## 2015-07-28 NOTE — Telephone Encounter (Signed)
-----   Message from Belva Crome, MD sent at 07/25/2015  6:22 PM EDT ----- Labs are good. How is lower extremity edema ?

## 2015-07-30 NOTE — Telephone Encounter (Signed)
Pt has appointment on 08/21/15 with Dr.Kumar, pt called and said she has seen Dr.Balan in past and wishes to continue seeing her. Recent notes will be faxed and appointment will be canceled.

## 2015-07-30 NOTE — Telephone Encounter (Signed)
Recent PTH level faxed to [facility]

## 2015-08-06 ENCOUNTER — Encounter: Payer: Self-pay | Admitting: Interventional Cardiology

## 2015-08-06 ENCOUNTER — Ambulatory Visit (INDEPENDENT_AMBULATORY_CARE_PROVIDER_SITE_OTHER): Payer: Medicare Other | Admitting: Interventional Cardiology

## 2015-08-06 ENCOUNTER — Ambulatory Visit: Payer: Medicare Other

## 2015-08-06 VITALS — BP 124/78 | HR 62 | Ht 64.5 in | Wt 168.8 lb

## 2015-08-06 DIAGNOSIS — I200 Unstable angina: Secondary | ICD-10-CM | POA: Diagnosis not present

## 2015-08-06 DIAGNOSIS — N183 Chronic kidney disease, stage 3 (moderate): Secondary | ICD-10-CM

## 2015-08-06 DIAGNOSIS — Z95810 Presence of automatic (implantable) cardiac defibrillator: Secondary | ICD-10-CM

## 2015-08-06 DIAGNOSIS — I25810 Atherosclerosis of coronary artery bypass graft(s) without angina pectoris: Secondary | ICD-10-CM

## 2015-08-06 DIAGNOSIS — I471 Supraventricular tachycardia: Secondary | ICD-10-CM

## 2015-08-06 DIAGNOSIS — I10 Essential (primary) hypertension: Secondary | ICD-10-CM

## 2015-08-06 DIAGNOSIS — I5022 Chronic systolic (congestive) heart failure: Secondary | ICD-10-CM | POA: Diagnosis not present

## 2015-08-06 NOTE — Progress Notes (Signed)
Cardiology Office Note   Date:  08/06/2015   ID:  [name], [name] 08-03-1951, MRN [id number]  PCP:  Reginia Naas, MD  Cardiologist:  Sinclair Grooms, MD   Chief Complaint  Patient presents with  . Congestive Heart Failure      History of Present Illness: [name] is a 64 y.o. female who presents for chronic systolic heart failure, coronary artery disease, ASCVD, type 2 diabetes, essential hypertension, and prior history of PSVT.  Just returned home from a vacation in [place]. Diuretic therapy compliance was not optimal. There was significant lower extremity swelling and dyspnea. We had  patient double her diuretic regimen until the weight came down into her typical range. She is here today feeling much better. Lower extremity swelling has resolved. Basic metabolic panel done within the past week reveals stable laboratory data. She denies angina. She has some discomfort in the pacemaker pocket. Had a recent power source change out within the past 3 months.    Past Medical History  Diagnosis Date  . Ischemic cardiomyopathy     status post biventricular ICD placed by DR Edumunds who used to see Dr Melvern Banker here to establish  cardiovascular care.  . Hypertension   . Coronary artery disease   . CHF (congestive heart failure)   . MVP (mitral valve prolapse)     Antibiotics not required for procedures  . Asthma   . Anxiety   . Depression   . Hiatal hernia   . Cervical dysplasia   . Fibroid   . Function kidney decreased   . History of shingles   . Chronic kidney disease   . ICD (implantable cardiac defibrillator) in place   . Pacemaker   . Complication of anesthesia     "I wake up during surgeries" (02/14/2013)  . Anginal pain   . Myocardial infarction     "I've had 2; the others they were able to catch before completing" (02/14/2013)  . Pneumonia 1950's & 1985  . Shortness of breath     "lying down flat; at times w/exertion" (02/14/2013)  . Type II  diabetes mellitus   . Iron deficiency anemia   . GERD (gastroesophageal reflux disease)   . History of stomach ulcers   . Migraines   . Stroke     "2 confirmed; 9 TIA's; results in dragging LLE; numbness in tip of tongue" (02/14/2013)    Past Surgical History  Procedure Laterality Date  . Insert / replace / remove pacemaker      biventricular defibrillator--06/10/ 2009  . Abdominal hysterectomy  1985    TAH   . Cardiac defibrillator placement    . Coronary angioplasty with stent placement      "started out w/5; bypass corrected some; 1 stent since the bypass" (02/14/2013)  . Breast excisional biopsy Left 01/2007; 06/2007; 03/2008    "benign" (02/14/2013)  . Cardiac catheterization      "probably in the teens" (02/14/2013)  . Colonoscopy  ~ 2002  . Tee without cardioversion  11/14/2012    Procedure: TRANSESOPHAGEAL ECHOCARDIOGRAM (TEE);  Surgeon: Candee Furbish, MD;  Location: [facility] ENDOSCOPY;  Service: Cardiovascular;  Laterality: N/A;  . Supraventricular tachycardia ablation  06/2007  . Biv icd genertaor change out  06/2007; 04/2008    "2 lead initial placement, at [facility]; done at [facility], after developing CHF" (02/14/2013)  . Coronary artery bypass graft  ` 1998    LIMA-LAD, SVG-D1, SVG-PDA  . Breast surgery    .  Dilation and curettage of uterus  1975 X 2; 1976; 1977  . Left heart catheterization with coronary/graft angiogram N/A 01/03/2015    Procedure: LEFT HEART CATHETERIZATION WITH Beatrix Fetters;  Surgeon: Sinclair Grooms, MD;  Location: [facility] CATH LAB;  Service: Cardiovascular;  Laterality: N/A;  . Biv pacemaker generator change out N/A 01/29/2015    Procedure: BIV PACEMAKER GENERATOR CHANGE OUT;  Surgeon: Evans Lance, MD;  Location: [facility] CATH LAB;  Service: Cardiovascular;  Laterality: N/A;     Current Outpatient Prescriptions  Medication Sig Dispense Refill  . albuterol (PROVENTIL HFA;VENTOLIN HFA) 108 (90 BASE) MCG/ACT inhaler Inhale 2 puffs into the lungs every 4 (four) hours as needed  for wheezing or shortness of breath.    . ALPRAZolam (XANAX) 1 MG tablet Take 0.5-1 mg by mouth 3 (three) times daily as needed (1 tab in the AM, 0.5 tab at noon and 0.5 tab in the PM).     . Artificial Tear GEL Apply 2 drops to eye daily as needed (dry eyes). \    . aspirin 325 MG EC tablet Take 325 mg by mouth daily.      [name] BIOTIN PO Take 10,000 mcg by mouth daily. Biotin plus keratin    . cetirizine (ZYRTEC) 10 MG tablet Take 10 mg by mouth daily.    . Cholecalciferol (VITAMIN D-3) 1000 UNITS CAPS Take 1 capsule by mouth daily.    . clopidogrel (PLAVIX) 75 MG tablet Take 1 tablet by mouth  daily 90 tablet 3  . doxazosin (CARDURA) 4 MG tablet Take 4 mg by mouth daily.    . Estradiol 10 MCG TABS vaginal tablet Place 1 tablet (10 mcg total) vaginally 2 (two) times a week. 8 tablet 11  . ezetimibe (ZETIA) 10 MG tablet Take 1 tablet by mouth  daily 90 tablet 3  . fluticasone (FLONASE) 50 MCG/ACT nasal spray Place 2 sprays into the nose daily as needed for allergies or rhinitis.     . Fluticasone-Salmeterol (ADVAIR) 100-50 MCG/DOSE AEPB Inhale 1 puff into the lungs daily as needed (shortness or breath or wheezing).     . furosemide (LASIX) 80 MG tablet Take 1 tablet by mouth two  times daily 180 tablet 3  . insulin glargine (LANTUS) 100 UNIT/ML injection Inject 18 Units into the skin at bedtime.     . isosorbide mononitrate (IMDUR) 30 MG 24 hr tablet Take 1 tablet by mouth  daily 90 tablet 3  . lidocaine (LIDODERM) 5 % Apply one (1) patch to lower back for twelve (12) hours daily.  0  . metoprolol (LOPRESSOR) 100 MG tablet Take 1 tablet (100 mg total) by mouth 2 (two) times daily. 180 tablet 3  . Multiple Vitamin (MULTIVITAMIN WITH MINERALS) TABS Take 1 tablet by mouth daily.    [name] NITROSTAT 0.4 MG SL tablet Place 1 tablet under the tongue every 5 minutes as needed for chest pain, max 3 doses, go to er if no relief 25 tablet 2  . omeprazole (PRILOSEC) 40 MG capsule Take 40 mg by mouth daily.    .  ondansetron (ZOFRAN) 8 MG tablet Take 8 mg by mouth every 8 (eight) hours as needed. For nausea    . pantoprazole (PROTONIX) 40 MG tablet Take 40 mg by mouth daily.    . potassium chloride (K-DUR) 10 MEQ tablet Take 2 tablets (20 mEq total) by mouth 2 (two) times daily. 360 tablet 1  . ramipril (ALTACE) 10 MG capsule Take 1 capsule  by mouth  twice daily 180 capsule 3  . simvastatin (ZOCOR) 20 MG tablet Take 20 mg by mouth at bedtime.    . sodium chloride (MURO 128) 5 % ophthalmic ointment Place 1 drop into both eyes daily as needed. For dry eyes    . traMADol (ULTRAM) 50 MG tablet Take 100 mg by mouth every 6 (six) hours as needed (MIGRAINES).    . traZODone (DESYREL) 50 MG tablet Take 25 mg by mouth at bedtime as needed for sleep.    [name] venlafaxine (EFFEXOR) 100 MG tablet Take 100 mg by mouth See admin instructions. Take 2 tablets by mouth in the am 1 tablet by mouth in the pm     No current facility-administered medications for this visit.    Allergies:   Codeine; Sulfa antibiotics; Sulfa drugs cross reactors; Digoxin and related; Diltiazem; Latex; Metolazone; Morphine sulfate; Myrbetriq; Onglyza; Penicillins; Septra ds; Spironolactone; Taztia xt; Tetracyclines & related; Ticlid; Verapamil; and Vicodin    Social History:  The patient  reports that she has never smoked. She has never used smokeless tobacco. She reports that she drinks alcohol. She reports that she does not use illicit drugs.   Family History:  The patient's family history includes Diabetes in her brother, father, and paternal grandmother; Heart attack in her brother, father, and mother; Heart disease in her brother, father, and mother; Hypertension in her father and mother; Kidney failure in her brother and father; Stroke in her father and mother.    ROS:  Please see the history of present illness.   Otherwise, review of systems are positive for left subclavicular pain and pacemaker pocket, mild dyspnea on exertion which is  chronic, joint discomfort related to osteoarthritis. She has noticed some skin change after resolution of edema from her legs. Rare this aphasia and reflux. No new neurological complaints.   All other systems are reviewed and negative.    PHYSICAL EXAM: VS:  BP 124/78 mmHg  Pulse 62  Ht 5' 4.5" (1.638 m)  Wt 76.567 kg (168 lb 12.8 oz)  BMI 28.54 kg/m2  SpO2 97% , BMI Body mass index is 28.54 kg/(m^2). GEN: Well nourished, well developed, in no acute distress HEENT: normal Neck: no JVD, carotid bruits, or masses Cardiac: RRR.  There is no murmur, rub, or gallop. There is no edema.there are areas of skin hypopigmentation. Respiratory:  clear to auscultation bilaterally, normal work of breathing. GI: soft, nontender, nondistended, + BS MS: no deformity or atrophy Skin: warm and dry, no rash Neuro:  Strength and sensation are intact Psych: euthymic mood, full affect   EKG:  EKG not ordered today.   Recent Labs: 01/02/2015: B Natriuretic Peptide 61.5; TSH 1.353 01/03/2015: ALT 24 01/22/2015: Hemoglobin 10.2*; Platelets 183.0 07/25/2015: BUN 12; Creatinine, Ser 1.57*; Potassium 3.8; Pro B Natriuretic peptide (BNP) 104.0*; Sodium 143    Lipid Panel    Component Value Date/Time   CHOL 144 01/03/2015 0304   TRIG 101 01/03/2015 0304   HDL 54 01/03/2015 0304   CHOLHDL 2.7 01/03/2015 0304   VLDL 20 01/03/2015 0304   LDLCALC 70 01/03/2015 0304      Wt Readings from Last 3 Encounters:  08/06/15 76.567 kg (168 lb 12.8 oz)  06/05/15 77.111 kg (170 lb)  05/08/15 77.656 kg (171 lb 3.2 oz)      Other studies Reviewed: Additional studies/ records that were reviewed today include: all recent encounters dating back to my last office visit in February were reviewed.. The findings include  no significant new data.    ASSESSMENT AND PLAN:  1. Chronic systolic heart failure Volume overload now improved. She is now back on her baseline diuretics regimen as noted above. I've encouraged her  to weigh on a daily basis. She is to call us if weight increases by more than 3 pounds in 1 day or greater than 5 pounds in a week.  2. Coronary artery disease involving coronary bypass graft of native heart without angina pectoris asymptomatic  3. CKD (chronic kidney disease), stage 3 (moderate) Stable with most recent laboratory data from 07/25/2015  4. Essential hypertension Excellent control  5. Biventricular implantable cardioverter-defibrillator in situ Functioning normally when last assessed  6. Paroxysmal SVT (supraventricular tachycardia) No significant recurrences    Current medicines are reviewed at length with the patient today.  The patient has the following concerns regarding medicines: none.  The following changes/actions have been instituted:    Compliance with diuretics therapy even when traveling is discussed  Labs/ tests ordered today include:  No orders of the defined types were placed in this encounter.     Disposition:   FU with HS in 6 months  Signed, Sinclair Grooms, MD  08/06/2015 4:17 PM    [facility] [place], [place], [place]  [address] Phone: [phone]; Fax: [phone]

## 2015-08-06 NOTE — Patient Instructions (Signed)
Medication Instructions:  Your physician recommends that you continue on your current medications as directed. Please refer to the Current Medication list given to you today.   Labwork: None ordered  Testing/Procedures: None ordered  Follow-Up: Your physician wants you to follow-up in: 6 months with Dr.Smith You will receive a reminder letter in the mail two months in advance. If you don't receive a letter, please call our office to schedule the follow-up appointment.   Any Other Special Instructions Will Be Listed Below (If Applicable).

## 2015-08-07 ENCOUNTER — Ambulatory Visit (INDEPENDENT_AMBULATORY_CARE_PROVIDER_SITE_OTHER): Payer: Medicare Other | Admitting: *Deleted

## 2015-08-07 DIAGNOSIS — I255 Ischemic cardiomyopathy: Secondary | ICD-10-CM

## 2015-08-07 NOTE — Progress Notes (Signed)
Remote ICD transmission.   

## 2015-08-14 LAB — CUP PACEART REMOTE DEVICE CHECK
Battery Voltage: 3.05 V
Brady Statistic AS VP Percent: 98.46 %
Brady Statistic AS VS Percent: 1.38 %
Brady Statistic RA Percent Paced: 0.16 %
HIGH POWER IMPEDANCE MEASURED VALUE: 47 Ohm
HighPow Impedance: 37 Ohm
Lead Channel Impedance Value: 304 Ohm
Lead Channel Impedance Value: 342 Ohm
Lead Channel Impedance Value: 456 Ohm
Lead Channel Pacing Threshold Amplitude: 1 V
Lead Channel Pacing Threshold Pulse Width: 0.4 ms
Lead Channel Pacing Threshold Pulse Width: 0.4 ms
Lead Channel Sensing Intrinsic Amplitude: 2.5 mV
Lead Channel Sensing Intrinsic Amplitude: 4.25 mV
Lead Channel Sensing Intrinsic Amplitude: 4.25 mV
Lead Channel Setting Pacing Amplitude: 1.5 V
Lead Channel Setting Pacing Amplitude: 2 V
Lead Channel Setting Pacing Pulse Width: 0.4 ms
Lead Channel Setting Pacing Pulse Width: 0.4 ms
MDC IDC MSMT BATTERY REMAINING LONGEVITY: 115 mo
MDC IDC MSMT LEADCHNL LV IMPEDANCE VALUE: 475 Ohm
MDC IDC MSMT LEADCHNL LV IMPEDANCE VALUE: 665 Ohm
MDC IDC MSMT LEADCHNL RA PACING THRESHOLD AMPLITUDE: 0.75 V
MDC IDC MSMT LEADCHNL RA PACING THRESHOLD PULSEWIDTH: 0.4 ms
MDC IDC MSMT LEADCHNL RA SENSING INTR AMPL: 2.5 mV
MDC IDC MSMT LEADCHNL RV IMPEDANCE VALUE: 456 Ohm
MDC IDC MSMT LEADCHNL RV PACING THRESHOLD AMPLITUDE: 0.875 V
MDC IDC SESS DTM: 20160929041704
MDC IDC SET LEADCHNL RV PACING AMPLITUDE: 2 V
MDC IDC SET LEADCHNL RV SENSING SENSITIVITY: 0.3 mV
MDC IDC STAT BRADY AP VP PERCENT: 0.15 %
MDC IDC STAT BRADY AP VS PERCENT: 0.01 %
MDC IDC STAT BRADY RV PERCENT PACED: 0.74 %
Zone Setting Detection Interval: 300 ms
Zone Setting Detection Interval: 350 ms
Zone Setting Detection Interval: 350 ms
Zone Setting Detection Interval: 430 ms

## 2015-08-19 ENCOUNTER — Ambulatory Visit (INDEPENDENT_AMBULATORY_CARE_PROVIDER_SITE_OTHER): Payer: Medicare Other

## 2015-08-19 DIAGNOSIS — Z23 Encounter for immunization: Secondary | ICD-10-CM | POA: Diagnosis not present

## 2015-08-20 ENCOUNTER — Encounter: Payer: Self-pay | Admitting: Cardiology

## 2015-08-21 ENCOUNTER — Ambulatory Visit: Payer: Medicare Other | Admitting: Endocrinology

## 2015-08-25 DIAGNOSIS — H04123 Dry eye syndrome of bilateral lacrimal glands: Secondary | ICD-10-CM | POA: Diagnosis not present

## 2015-08-25 DIAGNOSIS — H25813 Combined forms of age-related cataract, bilateral: Secondary | ICD-10-CM | POA: Diagnosis not present

## 2015-08-25 DIAGNOSIS — E119 Type 2 diabetes mellitus without complications: Secondary | ICD-10-CM | POA: Diagnosis not present

## 2015-08-25 DIAGNOSIS — H18832 Recurrent erosion of cornea, left eye: Secondary | ICD-10-CM | POA: Diagnosis not present

## 2015-09-01 ENCOUNTER — Encounter: Payer: Self-pay | Admitting: Internal Medicine

## 2015-09-03 ENCOUNTER — Telehealth: Payer: Self-pay | Admitting: Interventional Cardiology

## 2015-09-03 DIAGNOSIS — E1165 Type 2 diabetes mellitus with hyperglycemia: Secondary | ICD-10-CM | POA: Diagnosis not present

## 2015-09-03 DIAGNOSIS — E786 Lipoprotein deficiency: Secondary | ICD-10-CM | POA: Diagnosis not present

## 2015-09-03 DIAGNOSIS — E1121 Type 2 diabetes mellitus with diabetic nephropathy: Secondary | ICD-10-CM | POA: Diagnosis not present

## 2015-09-03 DIAGNOSIS — N183 Chronic kidney disease, stage 3 (moderate): Secondary | ICD-10-CM | POA: Diagnosis not present

## 2015-09-03 DIAGNOSIS — I10 Essential (primary) hypertension: Secondary | ICD-10-CM | POA: Diagnosis not present

## 2015-09-03 DIAGNOSIS — Z1389 Encounter for screening for other disorder: Secondary | ICD-10-CM | POA: Diagnosis not present

## 2015-09-03 DIAGNOSIS — Z0000 Encounter for general adult medical examination without abnormal findings: Secondary | ICD-10-CM | POA: Diagnosis not present

## 2015-09-03 DIAGNOSIS — Z95810 Presence of automatic (implantable) cardiac defibrillator: Secondary | ICD-10-CM | POA: Diagnosis not present

## 2015-09-03 DIAGNOSIS — I5022 Chronic systolic (congestive) heart failure: Secondary | ICD-10-CM | POA: Diagnosis not present

## 2015-09-03 DIAGNOSIS — I2510 Atherosclerotic heart disease of native coronary artery without angina pectoris: Secondary | ICD-10-CM | POA: Diagnosis not present

## 2015-09-03 NOTE — Telephone Encounter (Signed)
New message     Pt was looking at her office printout from her last visit with Dr Tamala Julian.  She said on the printout---Dr Marlou Porch is listed at a "care team provider".  She insist his name be removed because Dr Tamala Julian is her cardiologist and also add Dr Tanna Furry name as her EP doctor.  You do not have to call pt back.

## 2015-09-04 ENCOUNTER — Encounter: Payer: Self-pay | Admitting: Interventional Cardiology

## 2015-09-09 ENCOUNTER — Telehealth: Payer: Self-pay | Admitting: Neurology

## 2015-09-09 NOTE — Telephone Encounter (Signed)
Pt called sts she is scheduled for surgery 10/07/15 and needs clearance from Dr Leonie Man regarding plavix. She does get this from cardiologist. This operator spoke to [place] and she will speak to Dr Leonie Man to determine if he needs to see her. Pt has appt schedule 09/17/15.

## 2015-09-09 NOTE — Telephone Encounter (Signed)
It appears that the patient has not had another TIA symptoms 2014. She is neurologically cleared to hold aspirin and Plavix for 5 days prior to the procedure and to restart it later with a small but acceptable preprocedure risk of TIA/stroke. The cardiologist needs to give cardiology clearance for the procedure. The patient needs to keep scheduled appointment with me if she chooses to be a patient of this practice in the future

## 2015-09-10 NOTE — Telephone Encounter (Signed)
Rn call patient and her husband pick up the phone. Rn had to put husband on hold, and when the call was pick up, pt got on phone. Rn explain she wsa last seen by Dr.Sethi in 2014, and the Lynn(NP) in 2015. Rn explain that Dr.Sethi will see her for her past TIA stroke. Pt stated she is schedule to have another colonoscopy the end of the month. Pt stated she had TIA in the past being off her plavix. Rn stated she can discuss the concerns of the risk of the medication. Pt stated her cardiologist did prescribed the aspirin and plavix. Pt will arrive at 0915 for her appt, for check in next Thursday. Pt stated she just needs a neurological clearance.

## 2015-09-18 ENCOUNTER — Encounter: Payer: Self-pay | Admitting: Neurology

## 2015-09-18 ENCOUNTER — Ambulatory Visit (INDEPENDENT_AMBULATORY_CARE_PROVIDER_SITE_OTHER): Payer: Medicare Other | Admitting: Neurology

## 2015-09-18 VITALS — BP 135/83 | HR 62 | Ht 64.0 in | Wt 168.6 lb

## 2015-09-18 DIAGNOSIS — I6990 Unspecified sequelae of unspecified cerebrovascular disease: Secondary | ICD-10-CM | POA: Diagnosis not present

## 2015-09-18 DIAGNOSIS — I200 Unstable angina: Secondary | ICD-10-CM

## 2015-09-18 NOTE — Progress Notes (Signed)
PATIENT: [name] DOB: Dec 09, 1950  REASON FOR VISIT: routine stroke follow up HISTORY FROM: patient  HISTORY OF PRESENT ILLNESS: [name] is an 64 y.o. female who on 11/11/12 awoke with left sided weakness and difficulty with her speech.  Although patient could not have a MRI due to AICD it was felt by stroke team she did suffer a CVA. At that time TEE was negative for thrombus or PFO and it was felt Pradaxa was not indicated and was placed on both Plavix and ASA. Today patient was in cardiac rehab when at 0830 she noted left arm paresthesia which then progressed to both left arm and leg over a 20 minute period of time. Code stroke was called and patient was brought to CT. CT head was negative for acute CVA. On examination her NIHSS was 1 and patient continued to feel left arm, leg and face decreased sensation.   UPDATE 06/08/13 (LL): Patient comes to office for stroke follow up. She reports that she is doing very well and has had no new TIA symptoms (left arm and leg weakness) since the last episode while at cardiac rehab on 02/15/13. She has mild word hesitancy when she speaks. She takes Plavix and ASA 325 mg daily. Patient denies medication side effects, with no signs of bleeding or bruising. No new complaints.   UPDATE 02/11/14 (LL): Patient comes to office for stroke follow up. She is doing well without any repeat stroke or TIA symptoms.  Her husband, daughter, and brother have all had heart problems recently and she is helping to care for all of them.  Her daughter had to have an pacemaker implanted, her brother and husband both had MIs and needed bypass surgery.  She takes Plavix and ASA 325 mg daily. Patient denies medication side effects, with no signs of bleeding or bruising. No new complaints.  Update 09/18/2015 : She is seen for follow-up today after last visit a year and a half ago. She continues to do well without recurrent stroke or TIA symptoms now. She is  concerned about an upcoming schedule colonoscopy on 10/07/15 and risk for TIA/stroke while she has to hold her antiplatelet medications for 5 days. She had a possible TIA episode 2 years ago when she had to hold her antiplatelet drugs for her scheduled injection in her spine. Patient recently had a stool test which was positive and there is concern for cancer and hence colonoscopy is necessary. She states her blood pressure is well controlled and usually runs in the 120s though it is slightly elevated today at 135/83. She is tolerating Zocor quite well and had lipid profile checked by her primary physician 3 weeks ago and was fine. She had a good summer and returned from a long trip in [place]. She recently had her pacemaker changed and the procedure went well. She is on aspirin and Plavix and tolerating it well without bleeding or bruising. REVIEW OF SYSTEMS: Full 14 system review of systems performed and notable only for: fatigue,  light sensitivity, double vision, shortness of breath, chest pain, leg swelling, excessive thirst, rectal pain and all other systems negative ALLERGIES: Allergies  Allergen Reactions  . Codeine Anaphylaxis  . Sulfa Antibiotics     Swelling, hives  . Sulfa Drugs Cross Reactors     Swelling, hives  . Digoxin And Related     Unknown  . Diltiazem     Unknown  . Latex     Latex tape  .  Metolazone     Unknown  . Morphine Sulfate     Unknown  . Myrbetriq [Mirabegron]     Causes headaches  . Onglyza [Saxagliptin]     Causes migraines  . Penicillins     Unknown  . Septra Ds [Sulfamethoxazole W/Trimethoprim (Co-Trimoxazole)]     Unknown  . Spironolactone     Unknown  . Rema Fendt [Diltiazem Hcl]     Unknown  . Tetracyclines & Related     Unknown  . Ticlid [Ticlopidine Hcl]     Unknown  . Verapamil     Unknown  . Vicodin [Hydrocodone-Acetaminophen]     Unknown    HOME MEDICATIONS: Outpatient Prescriptions Prior to Visit  Medication Sig Dispense Refill  .  albuterol (PROVENTIL HFA;VENTOLIN HFA) 108 (90 BASE) MCG/ACT inhaler Inhale 2 puffs into the lungs every 4 (four) hours as needed for wheezing or shortness of breath.    . ALPRAZolam (XANAX) 1 MG tablet Take 0.5-1 mg by mouth 3 (three) times daily as needed (1 tab in the AM, 0.5 tab at noon and 0.5 tab in the PM).     . Artificial Tear GEL Apply 2 drops to eye daily as needed (dry eyes). \    . aspirin 325 MG EC tablet Take 325 mg by mouth daily.      [name] BIOTIN PO Take 10,000 mcg by mouth daily. Biotin plus keratin    . cetirizine (ZYRTEC) 10 MG tablet Take 10 mg by mouth daily.    . Cholecalciferol (VITAMIN D-3) 1000 UNITS CAPS Take 1 capsule by mouth daily.    . clopidogrel (PLAVIX) 75 MG tablet Take 1 tablet by mouth  daily 90 tablet 3  . doxazosin (CARDURA) 4 MG tablet Take 4 mg by mouth daily.    . Estradiol 10 MCG TABS vaginal tablet Place 1 tablet (10 mcg total) vaginally 2 (two) times a week. 8 tablet 11  . ezetimibe (ZETIA) 10 MG tablet Take 1 tablet by mouth  daily 90 tablet 3  . fluticasone (FLONASE) 50 MCG/ACT nasal spray Place 2 sprays into the nose daily as needed for allergies or rhinitis.     . Fluticasone-Salmeterol (ADVAIR) 100-50 MCG/DOSE AEPB Inhale 1 puff into the lungs daily as needed (shortness or breath or wheezing).     . furosemide (LASIX) 80 MG tablet Take 1 tablet by mouth two  times daily 180 tablet 3  . insulin glargine (LANTUS) 100 UNIT/ML injection Inject 18 Units into the skin at bedtime.     . isosorbide mononitrate (IMDUR) 30 MG 24 hr tablet Take 1 tablet by mouth  daily 90 tablet 3  . lidocaine (LIDODERM) 5 % Apply one (1) patch to lower back for twelve (12) hours daily.  0  . metoprolol (LOPRESSOR) 100 MG tablet Take 1 tablet (100 mg total) by mouth 2 (two) times daily. 180 tablet 3  . Multiple Vitamin (MULTIVITAMIN WITH MINERALS) TABS Take 1 tablet by mouth daily.    [name] NITROSTAT 0.4 MG SL tablet Place 1 tablet under the tongue every 5 minutes as needed for  chest pain, max 3 doses, go to er if no relief 25 tablet 2  . omeprazole (PRILOSEC) 40 MG capsule Take 40 mg by mouth daily.    . ondansetron (ZOFRAN) 8 MG tablet Take 8 mg by mouth every 8 (eight) hours as needed. For nausea    . pantoprazole (PROTONIX) 40 MG tablet Take 40 mg by mouth daily.    . potassium  chloride (K-DUR) 10 MEQ tablet Take 2 tablets (20 mEq total) by mouth 2 (two) times daily. 360 tablet 1  . ramipril (ALTACE) 10 MG capsule Take 1 capsule by mouth  twice daily 180 capsule 3  . simvastatin (ZOCOR) 20 MG tablet Take 20 mg by mouth at bedtime.    . sodium chloride (MURO 128) 5 % ophthalmic ointment Place 1 drop into both eyes daily as needed. For dry eyes    . traMADol (ULTRAM) 50 MG tablet Take 100 mg by mouth every 6 (six) hours as needed (MIGRAINES).    . traZODone (DESYREL) 50 MG tablet Take 25 mg by mouth at bedtime as needed for sleep.    [name] venlafaxine (EFFEXOR) 100 MG tablet Take 100 mg by mouth See admin instructions. Take 2 tablets by mouth in the am 1 tablet by mouth in the pm     No facility-administered medications prior to visit.     PHYSICAL EXAM  Filed Vitals:   09/18/15 0939  BP: 135/83  Pulse: 62  Height: 5\' 4"  (1.626 m)  Weight: 168 lb 9.6 oz (76.476 kg)   Body mass index is 28.93 kg/(m^2).  Generalized: anxious,middle aged african american pleasant AA female.  Neck: Supple, no carotid bruits  Cardiac: Regular rate rhythm, no murmur  Pulmonary: Clear to auscultation bilaterally  Musculoskeletal: No deformity   Neurological examination  Mentation: Alert oriented to time, place, history taking, mild word hesitancy/nonfleuncy  Cranial nerve II-XII: Pupils were equal round reactive to light extraocular movements were full, visual field were full on confrontational test. facial sensation and strength were normal. hearing was intact to finger rubbing bilaterally. Uvula tongue midline. head turning and shoulder shrug and were normal and symmetric.Tongue  protrusion into cheek strength was normal.  MOTOR: normal bulk and tone, full strength in the BUE, BLE, fine finger movements normal, no pronator drift  SENSORY: normal and symmetric to light touch, pinprick, temperature, vibration and proprioception  COORDINATION: finger-nose-finger, heel-to-shin bilaterally, there was no truncal ataxia  REFLEXES: Brachioradialis 2/2, biceps 2/2, triceps 2/2, patellar 2/2, Achilles 2/2, plantar responses were flexor bilaterally.  GAIT/STATION: Rising up from seated position without assistance, normal stance, without trunk ataxia, moderate stride, good arm swing, smooth turning, able to perform tiptoe, and heel walking without difficulty. Slight difficulty with tandem walking  Ct Head Wo Contrast 02/14/2013 No acute finding by CT. Chronic small vessel changes of the hemispheric deep white matter.  CTA of neck 11/11/12: Heavily calcified plaque in the left carotid bulb narrowing the lumen by 25% diameter stenosis. No significant right carotid stenosis. No significant vertebral artery stenosis.  CTA head 11/11/12: No significant intracranial stenosis.   ASSESSMENT AND PLAN 64 y.o. AA female with likely stroke on 11/11/12. Head CT normal, MRI unable due to AICD. CTA shows a 25% left ICA stenosis. No other significant abnormalities noted. Patient with multiple vascular risk factors. Stroke Risk Factors - PSVT diabetes mellitus, hypertension, stroke, CAD s/p CABG. She has had no recurrent TIA or stroke symptoms.  PLAN: I had a long d/w patient about her remote TIAs, risk for recurrent stroke/TIAs, personally independently reviewed imaging studies and stroke evaluation results and answered questions.Continue aspirin 325 mg daily and clopidogrel 75 mg daily  for secondary stroke prevention and maintain strict control of hypertension with blood pressure goal below 130/90, diabetes with hemoglobin A1c goal below 6.5% and lipids with LDL cholesterol goal below 100 mg/dL. I also advised  the patient to eat a healthy diet with plenty  of whole grains, cereals, fruits and vegetables, exercise regularly and maintain ideal body weight .patient is scheduled to undergo colonoscopy on 10/07/15.by Dr Collene Mares She is neurologically cleared to hold aspirin and Plavix 5 days prior to the procedure and resume after the procedure when safe with a small but acceptable preprocedural risk of TIA/stroke. The patient is quite anxious about this since she had a possible TIA 2 years ago when she had held her antiplatelet agents prior to her back injection. Followup in the future with me in 6 months or call earlier if necessary   No orders of the defined types were placed in this encounter.   Return in about 6 months (around 03/17/2016).  Antony Contras, MD  09/18/2015, 10:21 AM [facility] [address] [place], [place] [address] [phone]  Note: This document was prepared with digital dictation and possible smart phrase technology. Any transcriptional errors that result from this process are unintentional.

## 2015-09-18 NOTE — Patient Instructions (Signed)
I had a long d/w patient about her remote TIAs, risk for recurrent stroke/TIAs, personally independently reviewed imaging studies and stroke evaluation results and answered questions.Continue aspirin 325 mg daily and clopidogrel 75 mg daily  for secondary stroke prevention and maintain strict control of hypertension with blood pressure goal below 130/90, diabetes with hemoglobin A1c goal below 6.5% and lipids with LDL cholesterol goal below 100 mg/dL. I also advised the patient to eat a healthy diet with plenty of whole grains, cereals, fruits and vegetables, exercise regularly and maintain ideal body weight .patient is scheduled to undergo colonoscopy on 10/07/15.by Dr Collene Mares She is neurologically cleared to hold aspirin and Plavix 5 days prior to the procedure and resume after the procedure when safe with a small but acceptable preprocedural risk of TIA/stroke. The patient is quite anxious about this since she had a possible TIA 2 years ago when she had held her antiplatelet agents prior to her back injection. Followup in the future with me in 6 months or call earlier if necessary

## 2015-09-22 ENCOUNTER — Ambulatory Visit: Payer: Medicare Other | Admitting: Interventional Cardiology

## 2015-09-23 DIAGNOSIS — N183 Chronic kidney disease, stage 3 (moderate): Secondary | ICD-10-CM | POA: Diagnosis not present

## 2015-09-23 DIAGNOSIS — I10 Essential (primary) hypertension: Secondary | ICD-10-CM | POA: Diagnosis not present

## 2015-09-23 DIAGNOSIS — R946 Abnormal results of thyroid function studies: Secondary | ICD-10-CM | POA: Diagnosis not present

## 2015-09-23 DIAGNOSIS — E1165 Type 2 diabetes mellitus with hyperglycemia: Secondary | ICD-10-CM | POA: Diagnosis not present

## 2015-09-23 DIAGNOSIS — N189 Chronic kidney disease, unspecified: Secondary | ICD-10-CM | POA: Diagnosis not present

## 2015-09-23 DIAGNOSIS — N2581 Secondary hyperparathyroidism of renal origin: Secondary | ICD-10-CM | POA: Diagnosis not present

## 2015-09-24 NOTE — Telephone Encounter (Signed)
F/u  Pt stated to call her on her home number and please try to call her back today.

## 2015-09-24 NOTE — Telephone Encounter (Signed)
Called her back.  Please see phone note in dtr's chart for further explanation of phone call.  Dtr: [name], [id number]

## 2015-09-25 ENCOUNTER — Other Ambulatory Visit (HOSPITAL_COMMUNITY): Payer: Self-pay | Admitting: Endocrinology

## 2015-09-25 DIAGNOSIS — E0590 Thyrotoxicosis, unspecified without thyrotoxic crisis or storm: Secondary | ICD-10-CM

## 2015-09-29 ENCOUNTER — Encounter (HOSPITAL_COMMUNITY): Payer: Medicare Other

## 2015-09-29 DIAGNOSIS — N2581 Secondary hyperparathyroidism of renal origin: Secondary | ICD-10-CM | POA: Diagnosis not present

## 2015-09-29 DIAGNOSIS — D631 Anemia in chronic kidney disease: Secondary | ICD-10-CM | POA: Diagnosis not present

## 2015-09-29 DIAGNOSIS — N183 Chronic kidney disease, stage 3 (moderate): Secondary | ICD-10-CM | POA: Diagnosis not present

## 2015-09-29 DIAGNOSIS — I129 Hypertensive chronic kidney disease with stage 1 through stage 4 chronic kidney disease, or unspecified chronic kidney disease: Secondary | ICD-10-CM | POA: Diagnosis not present

## 2015-09-30 ENCOUNTER — Encounter (HOSPITAL_COMMUNITY): Payer: Medicare Other

## 2015-10-06 ENCOUNTER — Other Ambulatory Visit: Payer: Self-pay | Admitting: Interventional Cardiology

## 2015-10-06 ENCOUNTER — Other Ambulatory Visit: Payer: Self-pay | Admitting: Gastroenterology

## 2015-10-06 ENCOUNTER — Encounter (HOSPITAL_COMMUNITY): Payer: Self-pay | Admitting: *Deleted

## 2015-10-06 NOTE — Anesthesia Preprocedure Evaluation (Addendum)
Anesthesia Evaluation  Patient identified by MRN, date of birth, ID band Patient awake    Reviewed: Allergy & Precautions, NPO status , Patient's Chart, lab work & pertinent test results  History of Anesthesia Complications (+) history of anesthetic complications  Airway Mallampati: II  TM Distance: >3 FB Neck ROM: Full    Dental no notable dental hx.    Pulmonary shortness of breath, asthma , pneumonia,    Pulmonary exam normal breath sounds clear to auscultation       Cardiovascular hypertension, + angina + CAD, + Past MI and +CHF  negative cardio ROS Normal cardiovascular exam+ pacemaker + Cardiac Defibrillator  Rhythm:Regular Rate:Normal     Neuro/Psych  Headaches, PSYCHIATRIC DISORDERS Anxiety Depression TIA Neuromuscular disease CVA    GI/Hepatic hiatal hernia, GERD  ,(+) Hepatitis -  Endo/Other  negative endocrine ROSdiabetes  Renal/GU Renal disease  negative genitourinary   Musculoskeletal negative musculoskeletal ROS (+)   Abdominal   Peds negative pediatric ROS (+)  Hematology  (+) anemia ,   Anesthesia Other Findings   Reproductive/Obstetrics negative OB ROS                             Anesthesia Physical Anesthesia Plan  ASA: III  Anesthesia Plan: MAC   Post-op Pain Management:    Induction: Intravenous  Airway Management Planned: Natural Airway  Additional Equipment:   Intra-op Plan:   Post-operative Plan:   Informed Consent: I have reviewed the patients History and Physical, chart, labs and discussed the procedure including the risks, benefits and alternatives for the proposed anesthesia with the patient or authorized representative who has indicated his/her understanding and acceptance.   Dental advisory given  Plan Discussed with: CRNA  Anesthesia Plan Comments:         Anesthesia Quick Evaluation

## 2015-10-07 ENCOUNTER — Ambulatory Visit (HOSPITAL_COMMUNITY): Payer: Medicare Other | Admitting: Anesthesiology

## 2015-10-07 ENCOUNTER — Encounter (HOSPITAL_COMMUNITY)
Admission: RE | Disposition: A | Discharge status: Home / Self Care | Payer: Self-pay | Source: Ambulatory Visit | Attending: Gastroenterology

## 2015-10-07 ENCOUNTER — Ambulatory Visit (HOSPITAL_COMMUNITY)
Admission: RE | Admit: 2015-10-07 | Discharge: 2015-10-07 | Disposition: A | Discharge status: Home / Self Care | Payer: Medicare Other | Source: Ambulatory Visit | Attending: Gastroenterology | Admitting: Gastroenterology

## 2015-10-07 ENCOUNTER — Encounter (HOSPITAL_COMMUNITY): Payer: Self-pay

## 2015-10-07 DIAGNOSIS — R195 Other fecal abnormalities: Secondary | ICD-10-CM | POA: Diagnosis not present

## 2015-10-07 DIAGNOSIS — F329 Major depressive disorder, single episode, unspecified: Secondary | ICD-10-CM | POA: Insufficient documentation

## 2015-10-07 DIAGNOSIS — I255 Ischemic cardiomyopathy: Secondary | ICD-10-CM | POA: Insufficient documentation

## 2015-10-07 DIAGNOSIS — I69351 Hemiplegia and hemiparesis following cerebral infarction affecting right dominant side: Secondary | ICD-10-CM | POA: Insufficient documentation

## 2015-10-07 DIAGNOSIS — I252 Old myocardial infarction: Secondary | ICD-10-CM | POA: Diagnosis not present

## 2015-10-07 DIAGNOSIS — N189 Chronic kidney disease, unspecified: Secondary | ICD-10-CM | POA: Insufficient documentation

## 2015-10-07 DIAGNOSIS — E1122 Type 2 diabetes mellitus with diabetic chronic kidney disease: Secondary | ICD-10-CM | POA: Diagnosis not present

## 2015-10-07 DIAGNOSIS — Z955 Presence of coronary angioplasty implant and graft: Secondary | ICD-10-CM | POA: Diagnosis not present

## 2015-10-07 DIAGNOSIS — Z79899 Other long term (current) drug therapy: Secondary | ICD-10-CM | POA: Diagnosis not present

## 2015-10-07 DIAGNOSIS — E119 Type 2 diabetes mellitus without complications: Secondary | ICD-10-CM | POA: Diagnosis not present

## 2015-10-07 DIAGNOSIS — K219 Gastro-esophageal reflux disease without esophagitis: Secondary | ICD-10-CM | POA: Insufficient documentation

## 2015-10-07 DIAGNOSIS — I2510 Atherosclerotic heart disease of native coronary artery without angina pectoris: Secondary | ICD-10-CM | POA: Insufficient documentation

## 2015-10-07 DIAGNOSIS — Z95810 Presence of automatic (implantable) cardiac defibrillator: Secondary | ICD-10-CM | POA: Insufficient documentation

## 2015-10-07 DIAGNOSIS — Z7902 Long term (current) use of antithrombotics/antiplatelets: Secondary | ICD-10-CM | POA: Diagnosis not present

## 2015-10-07 DIAGNOSIS — B159 Hepatitis A without hepatic coma: Secondary | ICD-10-CM | POA: Insufficient documentation

## 2015-10-07 DIAGNOSIS — Z951 Presence of aortocoronary bypass graft: Secondary | ICD-10-CM | POA: Diagnosis not present

## 2015-10-07 DIAGNOSIS — Z794 Long term (current) use of insulin: Secondary | ICD-10-CM | POA: Diagnosis not present

## 2015-10-07 DIAGNOSIS — Z7982 Long term (current) use of aspirin: Secondary | ICD-10-CM | POA: Diagnosis not present

## 2015-10-07 DIAGNOSIS — J45909 Unspecified asthma, uncomplicated: Secondary | ICD-10-CM | POA: Insufficient documentation

## 2015-10-07 DIAGNOSIS — G709 Myoneural disorder, unspecified: Secondary | ICD-10-CM | POA: Insufficient documentation

## 2015-10-07 DIAGNOSIS — F419 Anxiety disorder, unspecified: Secondary | ICD-10-CM | POA: Insufficient documentation

## 2015-10-07 DIAGNOSIS — I130 Hypertensive heart and chronic kidney disease with heart failure and stage 1 through stage 4 chronic kidney disease, or unspecified chronic kidney disease: Secondary | ICD-10-CM | POA: Insufficient documentation

## 2015-10-07 DIAGNOSIS — I129 Hypertensive chronic kidney disease with stage 1 through stage 4 chronic kidney disease, or unspecified chronic kidney disease: Secondary | ICD-10-CM | POA: Diagnosis not present

## 2015-10-07 DIAGNOSIS — I341 Nonrheumatic mitral (valve) prolapse: Secondary | ICD-10-CM | POA: Diagnosis not present

## 2015-10-07 DIAGNOSIS — Z7951 Long term (current) use of inhaled steroids: Secondary | ICD-10-CM | POA: Diagnosis not present

## 2015-10-07 DIAGNOSIS — I509 Heart failure, unspecified: Secondary | ICD-10-CM | POA: Insufficient documentation

## 2015-10-07 DIAGNOSIS — Z8719 Personal history of other diseases of the digestive system: Secondary | ICD-10-CM | POA: Insufficient documentation

## 2015-10-07 DIAGNOSIS — Z1211 Encounter for screening for malignant neoplasm of colon: Secondary | ICD-10-CM | POA: Diagnosis not present

## 2015-10-07 DIAGNOSIS — K449 Diaphragmatic hernia without obstruction or gangrene: Secondary | ICD-10-CM | POA: Diagnosis not present

## 2015-10-07 DIAGNOSIS — N183 Chronic kidney disease, stage 3 (moderate): Secondary | ICD-10-CM | POA: Diagnosis not present

## 2015-10-07 HISTORY — PX: COLONOSCOPY WITH PROPOFOL: SHX5780

## 2015-10-07 HISTORY — DX: Dorsalgia, unspecified: M54.9

## 2015-10-07 HISTORY — DX: Presence of automatic (implantable) cardiac defibrillator: Z95.810

## 2015-10-07 HISTORY — DX: Inflammatory liver disease, unspecified: K75.9

## 2015-10-07 LAB — GLUCOSE, CAPILLARY
Glucose-Capillary: 73 mg/dL (ref 65–99)
Glucose-Capillary: 121 mg/dL — ABNORMAL HIGH (ref 65–99)

## 2015-10-07 SURGERY — COLONOSCOPY WITH PROPOFOL
Anesthesia: Monitor Anesthesia Care

## 2015-10-07 MED ORDER — SODIUM CHLORIDE 0.9 % IV SOLN: INTRAVENOUS | Status: DC

## 2015-10-07 MED ORDER — PROPOFOL 500 MG/50ML IV EMUL
INTRAVENOUS | Status: DC | PRN
  Administered 2015-10-07: 40 mg via INTRAVENOUS

## 2015-10-07 MED ORDER — LACTATED RINGERS IV SOLN
INTRAVENOUS | Status: DC | PRN
  Administered 2015-10-07: 07:00:00 via INTRAVENOUS
  Administered 2015-10-07: 1000 mL

## 2015-10-07 MED ORDER — DEXTROSE 50 % IV SOLN
INTRAVENOUS | Status: DC | PRN
  Administered 2015-10-07: 25 mL via INTRAVENOUS

## 2015-10-07 MED ORDER — PROPOFOL 500 MG/50ML IV EMUL
INTRAVENOUS | Status: DC | PRN
  Administered 2015-10-07: 250 ug/kg/min via INTRAVENOUS

## 2015-10-07 MED ORDER — PROPOFOL 10 MG/ML IV BOLUS
INTRAVENOUS | Status: AC
  Filled 2015-10-07: qty 60

## 2015-10-07 MED ORDER — PHENYLEPHRINE HCL 10 MG/ML IJ SOLN
INTRAMUSCULAR | Status: DC | PRN
  Administered 2015-10-07: 80 ug via INTRAVENOUS

## 2015-10-07 MED ORDER — SODIUM CHLORIDE 0.9 % IJ SOLN
INTRAMUSCULAR | Status: AC
  Filled 2015-10-07: qty 10

## 2015-10-07 MED ORDER — GLYCOPYRROLATE 0.2 MG/ML IJ SOLN
INTRAMUSCULAR | Status: AC
  Filled 2015-10-07: qty 1

## 2015-10-07 MED ORDER — EPHEDRINE SULFATE 50 MG/ML IJ SOLN
INTRAMUSCULAR | Status: AC
  Filled 2015-10-07: qty 1

## 2015-10-07 MED ORDER — DEXTROSE 50 % IV SOLN
INTRAVENOUS | Status: AC
  Filled 2015-10-07: qty 50

## 2015-10-07 SURGICAL SUPPLY — 22 items

## 2015-10-07 NOTE — H&P (Addendum)
[name] is an 64 y.o. female.   Chief Complaint:  Colorectal cancer screening. HPI: 64 year old black female here for a screening colonoscopy after she was found to be positive for Cologaurd stool DNA test. Her Aspirin and Plavix have been held for 5 days prior to the procedure after consultation with Dr. Antony Contras. See office notes for other details.   Past Medical History  Diagnosis Date  . Ischemic cardiomyopathy     status post biventricular ICD placed by DR Edumunds who used to see Dr Melvern Banker here to establish  cardiovascular care.  . Hypertension   . Coronary artery disease   . CHF (congestive heart failure) ([place])   . MVP (mitral valve prolapse)     Antibiotics not required for procedures  . Asthma   . Anxiety   . Depression   . Hiatal hernia   . Cervical dysplasia   . Fibroid   . Function kidney decreased   . History of shingles     2-3 yrs ago last out break "around waist"  . ICD (implantable cardiac defibrillator) in place   . Pacemaker   . Complication of anesthesia     "I wake up during surgeries" (02/14/2013)  . Anginal pain ([place])   . Myocardial infarction ([place])     "I've had 2; the others they were able to catch before completing" (02/14/2013)  . Pneumonia 1950's & 1985  . Type II diabetes mellitus ([place])   . Iron deficiency anemia   . GERD (gastroesophageal reflux disease)   . History of stomach ulcers   . Migraines   . Stroke [facility])     "2 confirmed; 9 TIA's; results in dragging LLE; numbness in tip of tongue" (02/14/2013),10-06-15 right hand tends to be weaker when tired.  . Shortness of breath     "lying down flat; at times w/exertion" (02/14/2013). 10-06-15 exertion only..  . Chronic kidney disease     Dr. Posey Pronto follows.  . Back pain     "pinched nerve-lower back" - Dr. Nelva Bush follows.  . Hepatitis     Hepatitis A -college yrs"water source exposure"  . AICD (automatic cardioverter/defibrillator) present     [employer]- Dr. Beckie Salts follows   Past  Surgical History  Procedure Laterality Date  . Insert / replace / remove pacemaker      biventricular defibrillator--06/10/ 2009  . Abdominal hysterectomy  1985    TAH   . Cardiac defibrillator placement    . Coronary angioplasty with stent placement      "started out w/5; bypass corrected some; 1 stent since the bypass" (02/14/2013)  . Breast excisional biopsy Left 01/2007; 06/2007; 03/2008    "benign" (02/14/2013)  . Cardiac catheterization      "probably in the teens" (02/14/2013)  . Colonoscopy  ~ 2002  . Tee without cardioversion  11/14/2012    Procedure: TRANSESOPHAGEAL ECHOCARDIOGRAM (TEE);  Surgeon: Candee Furbish, MD;  Location: [facility] ENDOSCOPY;  Service: Cardiovascular;  Laterality: N/A;  . Supraventricular tachycardia ablation  06/2007  . Biv icd genertaor change out  06/2007; 04/2008    "2 lead initial placement, at [facility]; done at [facility], after developing CHF" (02/14/2013)  . Coronary artery bypass graft  ` 1998    LIMA-LAD, SVG-D1, SVG-PDA  . Breast surgery    . Dilation and curettage of uterus  1975 X 2; 1976; 1977  . Left heart catheterization with coronary/graft angiogram N/A 01/03/2015    Procedure: LEFT HEART CATHETERIZATION WITH CORONARY/GRAFT ANGIOGRAM;  Surgeon: Sinclair Grooms, MD;  Location: [facility] CATH LAB;  Service: Cardiovascular;  Laterality: N/A;  . Biv pacemaker generator change out N/A 01/29/2015    Procedure: BIV PACEMAKER GENERATOR CHANGE OUT;  Surgeon: Evans Lance, MD;  Location: [facility] CATH LAB;  Service: Cardiovascular;  Laterality: N/A;   Family History  Problem Relation Age of Onset  . Heart disease Mother   . Hypertension Mother   . Heart attack Mother   . Stroke Mother   . Heart disease Father   . Hypertension Father   . Diabetes Father   . Kidney failure Father   . Heart attack Father   . Stroke Father   . Heart disease Brother   . Diabetes Brother   . Kidney failure Brother   . Heart attack Brother   . Diabetes Paternal Grandmother    Social History:  reports  that she has never smoked. She has never used smokeless tobacco. She reports that she does not drink alcohol or use illicit drugs.  Allergies:  Allergies  Allergen Reactions  . Calcium Channel Blockers Other (See Comments)    Cannot take non-DHP CCBs (diltiazem, verapamil) due to CHF requiring ICD  . Codeine Anaphylaxis  . Ticlid [Ticlopidine Hcl] Other (See Comments)    Unprovoked bleeding while on Ticlid (doesn't think she was on ASA at the time) Takes Plavix without problems  . Morphine And Related Other (See Comments)    Severe AMS, confusion, weakness Tolerates Fentanyl, Tramadol  . Tape Dermatitis and Rash    Tolerates paper tape  . Digoxin And Related Nausea Only    Unknown  . Metolazone Other (See Comments)    Cannot remember reaction  . Myrbetriq [Mirabegron] Other (See Comments)    Aggravates migraines  . Onglyza [Saxagliptin] Other (See Comments)    Exacerbates migraines  . Penicillins Hives    Has patient had a PCN reaction causing immediate rash, facial/tongue/throat swelling, SOB or lightheadedness with hypotension: No Has patient had a PCN reaction causing severe rash involving mucus membranes or skin necrosis: No Has patient had a PCN reaction that required hospitalization No Has patient had a PCN reaction occurring within the last 10 years: No If all of the above answers are "NO", then may proceed with Cephalosporin use.   [name] Spironolactone Other (See Comments)    Cannot remember reaction  . Sulfa Antibiotics Hives  . Tetracyclines & Related Other (See Comments)    Cannot remember reaction Tolerates macrolides (azithromycin)  . Latex Itching and Rash   Medications Prior to Admission  Medication Sig Dispense Refill  . albuterol (PROVENTIL HFA;VENTOLIN HFA) 108 (90 BASE) MCG/ACT inhaler Inhale 2 puffs into the lungs every 4 (four) hours as needed for wheezing or shortness of breath.    . ALPRAZolam (XANAX) 0.5 MG tablet Take 1-2 tablets by mouth 3 (three) times  daily. 2 tabs in the AM, 1 at noon, 1 at night    . Artificial Tear GEL Apply 2 drops to eye daily as needed (dry eyes). \    . aspirin 325 MG EC tablet Take 325 mg by mouth daily.      [name] BIOTIN PO Take 10 mg by mouth daily. Biotin plus keratin    . cetirizine (ZYRTEC) 10 MG tablet Take 10 mg by mouth daily.    . Cholecalciferol (VITAMIN D-3) 1000 UNITS CAPS Take 1 capsule by mouth at bedtime.     . clopidogrel (PLAVIX) 75 MG tablet Take 1 tablet by mouth  daily 90 tablet 3  . doxazosin (CARDURA) 4 MG tablet Take 4 mg by mouth at bedtime.     . Estradiol 10 MCG TABS vaginal tablet Place 1 tablet (10 mcg total) vaginally 2 (two) times a week. (Patient taking differently: Place 1 tablet vaginally 2 (two) times a week. Sunday, Thursday) 8 tablet 11  . ezetimibe (ZETIA) 10 MG tablet Take 1 tablet by mouth  daily 90 tablet 3  . fluticasone (FLONASE) 50 MCG/ACT nasal spray Place 2 sprays into the nose daily as needed for allergies or rhinitis.     . furosemide (LASIX) 80 MG tablet Take 1 tablet by mouth two  times daily 180 tablet 3  . isosorbide mononitrate (IMDUR) 30 MG 24 hr tablet Take 1 tablet by mouth  daily 90 tablet 3  . Lancets (FREESTYLE) lancets     . LANTUS SOLOSTAR 100 UNIT/ML Solostar Pen Inject 18 Units into the skin at bedtime.    . lidocaine (LIDODERM) 5 % Place 1 patch onto the skin every 12 (twelve) hours as needed.   0  . metoprolol (LOPRESSOR) 100 MG tablet Take 1 tablet (100 mg total) by mouth 2 (two) times daily. 180 tablet 3  . Multiple Vitamin (MULTIVITAMIN WITH MINERALS) TABS Take 1 tablet by mouth daily.    [name] NITROSTAT 0.4 MG SL tablet Place 1 tablet under the tongue every 5 minutes as needed for chest pain, max 3 doses, go to er if no relief 25 tablet 2  . ondansetron (ZOFRAN) 8 MG tablet Take 8 mg by mouth every 8 (eight) hours as needed. For nausea    . pantoprazole (PROTONIX) 40 MG tablet Take 40 mg by mouth daily.    [name] PEG 3350-KCl-NaBcb-NaCl-NaSulf  (PEG-3350/ELECTROLYTES) 236 G SOLR Take 0.5 Containers by mouth See admin instructions. Start first half at 6pm, second half at 2am  0  . potassium chloride (K-DUR) 10 MEQ tablet Take 2 tablets (20 mEq total) by mouth 2 (two) times daily. 360 tablet 1  . ramipril (ALTACE) 10 MG capsule Take 1 capsule by mouth  twice daily 180 capsule 3  . sodium chloride (MURO 128) 5 % ophthalmic ointment Place 1 drop into both eyes at bedtime. For dry eyes    . traMADol (ULTRAM) 50 MG tablet Take 100 mg by mouth every 6 (six) hours as needed (MIGRAINES).    [name] venlafaxine (EFFEXOR) 100 MG tablet Take 100-200 mg by mouth See admin instructions. Take 2 tablets by mouth in the am 1 tablet by mouth in the pm    . simvastatin (ZOCOR) 20 MG tablet Take 1 tablet by mouth at  bedtime 90 tablet 2   Review of Systems  Constitutional: Negative.   HENT: Negative.   Eyes: Negative.   Respiratory: Negative.   Cardiovascular: Negative.   Gastrointestinal: Positive for heartburn.  Genitourinary: Negative.   Neurological: Negative.   Endo/Heme/Allergies: Negative.   Psychiatric/Behavioral: The patient is nervous/anxious and has insomnia.    Blood pressure 150/86, pulse 66, temperature 98.5 F (36.9 C), temperature source Oral, resp. rate 11, height 5\' 4"  (1.626 m), weight 76.204 kg (168 lb), SpO2 98 %. Physical Exam  Constitutional: She is oriented to person, place, and time. She appears well-developed and well-nourished.  HENT:  Head: Normocephalic and atraumatic.  Eyes: Conjunctivae and EOM are normal. Pupils are equal, round, and reactive to light.  Neck: Normal range of motion. Neck supple.  Cardiovascular: Normal rate and regular rhythm.   Respiratory: Effort normal and breath  sounds normal.  GI: Soft. Bowel sounds are normal.  Musculoskeletal: Normal range of motion.  Neurological: She is alert and oriented to person, place, and time.  Skin: Skin is warm and dry.  Psychiatric: She has a normal mood and affect.  Her behavior is normal. Judgment and thought content normal.    Assessment/Plan Colorectal cancer screening: proceed with a colonoscopy at this time.  Randen Kauth 10/07/2015, 6:53 AM

## 2015-10-07 NOTE — Op Note (Signed)
[facility] [place], [address]   OPERATIVE PROCEDURE REPORT  PATIENT: [name], [name]  MR#: [id number] BIRTHDATE: September 12, 1951 GENDER: female ENDOSCOPIST: Edmonia James, MD ASSISTANT:   Sharon Mt, technician & Carmie End, RN PROCEDURE DATE: 2015/10/29 PRE-PROCEDURE PREPARATION: Patient fasted for 4 hours prior to procedure. The patient was prepped with a gallon of Golytely the night prior to the procedure. PRE-PROCEDURE PHYSICAL: Patient has stable vital signs.  Neck is supple.  There is no JVD, thyromegaly or LAD.  Chest clear to auscultation.  S1 and S2 regular.  Abdomen soft, non-distended, non-tender with NABS. PROCEDURE:     Colonoscopy, diagnostic ASA CLASS:     Class IV INDICATIONS:     Colorectal cancer screening-positive Cologaurd recentlyiaverage risk patient for colon cancer. MEDICATIONS:     Monitored anesthesia care  DESCRIPTION OF PROCEDURE:   After the risks, benefits, and alternatives of the procedure were thoroughly explained [including a 10% missed rate of cancer and polyps], informed consent was obtained.  Digital rectal exam was performed.  The Pentax Ped Colon [id number]  was introduced through the anus  and advanced to the cecum, which was identified by both the appendix and ileocecal valve , limited by No adverse events experienced.   The quality of the prep was adequate . Multiple washes were done. Small lesions could be missed. The instrument was then slowly withdrawn as the colon was fully examined. Estimated blood loss is zero unless otherwise noted in this procedure report.     COLON FINDINGS: The entire colonic mucosa appeared healthy with a normal vascular pattern. No masses, polyps, diverticula or AVMs were noted. The appendiceal orifice and the ICV were identified and photographed. Retroflexed views revealed no abnormalities.  The patient tolerated the procedure without immediate complications. The  scope was then withdrawn from the patient and the procedure terminated.  TIME TO CECUM:   9 minutes 00 seconds WITHDRAW TIME:  6 minutes 00 seconds  IMPRESSION:     Normal colonoscopy upto the cecum.  RECOMMENDATIONS:     1.  Continue current medications. 2.  Continue surveillance. 3.  High fiber diet with liberal fluid intake. 4.  OP follow-up is advised on a PRN basis.  REPEAT EXAM:      In 10 years  for a repeat colonoscopy.  If the patient has any abnormal GI symptoms in the interim, she have been advised to contact the office as soon as possible for further recommendations.   REFERRED [id number] Tamala Julian, M.D.  Uvaldo Rising, M.D. eSigned:  Edmonia James, MD 2015/10/29 7:58 AM  CPT CODES:     [phone] Colonoscopy, flexible, proximal to splenic flexure; diagnostic, with or without collection of specimen(s) by brushing or washing, with or without colon decompression (separate procedure) ICD CODES:     Z12.11 Encounter for screening for malignant neoplasm of colon  The ICD and CPT codes recommended by this software are interpretations from the data that the clinical staff has captured with the software.  The verification of the translation of this report to the ICD and CPT codes and modifiers is the sole responsibility of the health care institution and practicing physician where this report was generated.  [employer] will not be held responsible for the validity of the ICD and CPT codes included on this report.  AMA assumes no liability for data contained or not contained herein. CPT is a Designer, television/film set of the [employer].  PATIENT NAME:  Mungia,  Kinzly Schuff MR#: [id number]

## 2015-10-07 NOTE — Anesthesia Postprocedure Evaluation (Signed)
Anesthesia Post Note  Patient: [name]  Procedure(s) Performed: Procedure(s) (LRB): COLONOSCOPY WITH PROPOFOL (N/A)  Patient location during evaluation: PACU Anesthesia Type: MAC Level of consciousness: awake and alert Pain management: pain level controlled Vital Signs Assessment: post-procedure vital signs reviewed and stable Respiratory status: spontaneous breathing, nonlabored ventilation, respiratory function stable and patient connected to nasal cannula oxygen Cardiovascular status: stable and blood pressure returned to baseline Anesthetic complications: no    Last Vitals:  Filed Vitals:   10/07/15 0810 10/07/15 0820  BP: 140/80 143/85  Pulse: 65 62  Temp:    Resp: 17 10    Last Pain: There were no vitals filed for this visit.               Edman Lipsey J

## 2015-10-07 NOTE — Discharge Instructions (Signed)
Colonoscopy, Care After These instructions give you information on caring for yourself after your procedure. Your doctor may also give you more specific instructions. Call your doctor if you have any problems or questions after your procedure. HOME CARE  Do not drive for 24 hours.  Do not sign important papers or use machinery for 24 hours.  You may shower.  You may go back to your usual activities, but go slower for the first 24 hours.  Take rest breaks often during the first 24 hours.  Walk around or use warm packs on your belly (abdomen) if you have belly cramping or gas.  Drink enough fluids to keep your pee (urine) clear or pale yellow.  Resume your normal diet. Avoid heavy or fried foods.  Avoid drinking alcohol for 24 hours or as told by your doctor.  Only take medicines as told by your doctor. If a tissue sample (biopsy) was taken during the procedure:   Do not take aspirin or blood thinners for 7 days, or as told by your doctor.  Do not drink alcohol for 7 days, or as told by your doctor.  Eat soft foods for the first 24 hours. GET HELP IF: You still have a small amount of blood in your poop (stool) 2-3 days after the procedure. GET HELP RIGHT AWAY IF:  You have more than a small amount of blood in your poop.  You see clumps of tissue (blood clots) in your poop.  Your belly is puffy (swollen).  You feel sick to your stomach (nauseous) or throw up (vomit).  You have a fever.  You have belly pain that gets worse and medicine does not help. MAKE SURE YOU:  Understand these instructions.  Will watch your condition.  Will get help right away if you are not doing well or get worse.   Per Dr. Collene Mares, restart blood thinners today.   This information is not intended to replace advice given to you by your health care provider. Make sure you discuss any questions you have with your health care provider.   Document Released: 11/27/2010 Document Revised: 10/30/2013  Document Reviewed: 07/02/2013 Elsevier Interactive Patient Education [employer].

## 2015-10-07 NOTE — Transfer of Care (Signed)
Immediate Anesthesia Transfer of Care Note  Patient: [name] Fails  Procedure(s) Performed: Procedure(s): COLONOSCOPY WITH PROPOFOL (N/A)  Patient Location: PACU  Anesthesia Type:MAC  Level of Consciousness:  sedated, patient cooperative and responds to stimulation  Airway & Oxygen Therapy:Patient Spontanous Breathing and Patient connected to face mask oxgen  Post-op Assessment:  Report given to PACU RN and Post -op Vital signs reviewed and stable  Post vital signs:  Reviewed and stable  Last Vitals:  Filed Vitals:   10/07/15 0641 10/07/15 0755  BP: 150/86 111/69  Pulse: 66 70  Temp: 36.9 C   Resp: 11 14    Complications: No apparent anesthesia complications

## 2015-10-08 ENCOUNTER — Encounter (HOSPITAL_COMMUNITY): Payer: Self-pay | Admitting: Gastroenterology

## 2015-10-15 ENCOUNTER — Encounter (HOSPITAL_COMMUNITY)
Admission: RE | Admit: 2015-10-15 | Discharge: 2015-10-15 | Disposition: A | Discharge status: Home / Self Care | Payer: Medicare Other | Source: Ambulatory Visit | Attending: Endocrinology | Admitting: Endocrinology

## 2015-10-15 DIAGNOSIS — E0590 Thyrotoxicosis, unspecified without thyrotoxic crisis or storm: Secondary | ICD-10-CM | POA: Insufficient documentation

## 2015-10-15 MED ORDER — SODIUM IODIDE I 131 CAPSULE: 7.1000 | Freq: Once | INTRAVENOUS | Status: DC | PRN

## 2015-10-16 ENCOUNTER — Encounter (HOSPITAL_COMMUNITY)
Admission: RE | Admit: 2015-10-16 | Discharge: 2015-10-16 | Disposition: A | Discharge status: Home / Self Care | Payer: Medicare Other | Source: Ambulatory Visit | Attending: Endocrinology | Admitting: Endocrinology

## 2015-10-16 DIAGNOSIS — R251 Tremor, unspecified: Secondary | ICD-10-CM | POA: Diagnosis not present

## 2015-10-16 DIAGNOSIS — F419 Anxiety disorder, unspecified: Secondary | ICD-10-CM | POA: Diagnosis not present

## 2015-10-16 DIAGNOSIS — E0590 Thyrotoxicosis, unspecified without thyrotoxic crisis or storm: Secondary | ICD-10-CM | POA: Diagnosis not present

## 2015-10-16 MED ORDER — SODIUM PERTECHNETATE TC 99M INJECTION
10.2600 | Freq: Once | INTRAVENOUS | Status: AC | PRN
  Administered 2015-10-16: 10 via INTRAVENOUS

## 2015-10-23 DIAGNOSIS — F411 Generalized anxiety disorder: Secondary | ICD-10-CM | POA: Diagnosis not present

## 2015-10-23 DIAGNOSIS — F332 Major depressive disorder, recurrent severe without psychotic features: Secondary | ICD-10-CM | POA: Diagnosis not present

## 2015-11-06 ENCOUNTER — Ambulatory Visit (INDEPENDENT_AMBULATORY_CARE_PROVIDER_SITE_OTHER): Payer: Medicare Other | Admitting: *Deleted

## 2015-11-06 DIAGNOSIS — I255 Ischemic cardiomyopathy: Secondary | ICD-10-CM | POA: Diagnosis not present

## 2015-11-06 NOTE — Progress Notes (Signed)
Remote ICD transmission.   

## 2015-11-07 DIAGNOSIS — J329 Chronic sinusitis, unspecified: Secondary | ICD-10-CM | POA: Diagnosis not present

## 2015-11-25 LAB — CUP PACEART REMOTE DEVICE CHECK
Battery Remaining Longevity: 113 mo
Battery Voltage: 3.03 V
Brady Statistic AS VS Percent: 1.4 %
Date Time Interrogation Session: 20161229072825
HIGH POWER IMPEDANCE MEASURED VALUE: 39 Ohm
HIGH POWER IMPEDANCE MEASURED VALUE: 51 Ohm
Implantable Lead Location: 753859
Implantable Lead Model: 4194
Implantable Lead Model: 5076
Lead Channel Impedance Value: 361 Ohm
Lead Channel Impedance Value: 456 Ohm
Lead Channel Pacing Threshold Amplitude: 0.625 V
Lead Channel Pacing Threshold Amplitude: 0.875 V
Lead Channel Pacing Threshold Amplitude: 1 V
Lead Channel Pacing Threshold Pulse Width: 0.4 ms
Lead Channel Pacing Threshold Pulse Width: 0.4 ms
Lead Channel Sensing Intrinsic Amplitude: 3.625 mV
Lead Channel Setting Pacing Amplitude: 1.5 V
Lead Channel Setting Pacing Pulse Width: 0.4 ms
MDC IDC LEAD IMPLANT DT: 20090610
MDC IDC LEAD IMPLANT DT: 20090610
MDC IDC LEAD IMPLANT DT: 20090610
MDC IDC LEAD LOCATION: 753858
MDC IDC LEAD LOCATION: 753860
MDC IDC MSMT LEADCHNL LV IMPEDANCE VALUE: 285 Ohm
MDC IDC MSMT LEADCHNL LV IMPEDANCE VALUE: 513 Ohm
MDC IDC MSMT LEADCHNL LV IMPEDANCE VALUE: 665 Ohm
MDC IDC MSMT LEADCHNL RA PACING THRESHOLD PULSEWIDTH: 0.4 ms
MDC IDC MSMT LEADCHNL RA SENSING INTR AMPL: 2.5 mV
MDC IDC MSMT LEADCHNL RA SENSING INTR AMPL: 2.5 mV
MDC IDC MSMT LEADCHNL RV IMPEDANCE VALUE: 475 Ohm
MDC IDC MSMT LEADCHNL RV SENSING INTR AMPL: 3.625 mV
MDC IDC SET LEADCHNL LV PACING AMPLITUDE: 2 V
MDC IDC SET LEADCHNL RV SENSING SENSITIVITY: 0.3 mV
MDC IDC STAT BRADY AP VP PERCENT: 0.16 %
MDC IDC STAT BRADY AP VS PERCENT: 0.02 %
MDC IDC STAT BRADY AS VP PERCENT: 98.42 %
MDC IDC STAT BRADY RA PERCENT PACED: 0.18 %
MDC IDC STAT BRADY RV PERCENT PACED: 0.27 %

## 2015-11-26 ENCOUNTER — Encounter: Payer: Self-pay | Admitting: Cardiology

## 2015-12-13 ENCOUNTER — Other Ambulatory Visit: Payer: Self-pay | Admitting: Interventional Cardiology

## 2015-12-18 ENCOUNTER — Encounter (HOSPITAL_COMMUNITY): Payer: Self-pay

## 2015-12-18 ENCOUNTER — Emergency Department (HOSPITAL_COMMUNITY): Payer: Medicare Other

## 2015-12-18 ENCOUNTER — Emergency Department (HOSPITAL_COMMUNITY)
Admission: EM | Admit: 2015-12-18 | Discharge: 2015-12-18 | Disposition: A | Discharge status: Home / Self Care | Payer: Medicare Other | Attending: Emergency Medicine | Admitting: Emergency Medicine

## 2015-12-18 DIAGNOSIS — J45909 Unspecified asthma, uncomplicated: Secondary | ICD-10-CM | POA: Diagnosis not present

## 2015-12-18 DIAGNOSIS — R197 Diarrhea, unspecified: Secondary | ICD-10-CM | POA: Insufficient documentation

## 2015-12-18 DIAGNOSIS — K219 Gastro-esophageal reflux disease without esophagitis: Secondary | ICD-10-CM | POA: Diagnosis not present

## 2015-12-18 DIAGNOSIS — R1084 Generalized abdominal pain: Secondary | ICD-10-CM

## 2015-12-18 DIAGNOSIS — Z95810 Presence of automatic (implantable) cardiac defibrillator: Secondary | ICD-10-CM | POA: Diagnosis not present

## 2015-12-18 DIAGNOSIS — Z8701 Personal history of pneumonia (recurrent): Secondary | ICD-10-CM | POA: Diagnosis not present

## 2015-12-18 DIAGNOSIS — I25119 Atherosclerotic heart disease of native coronary artery with unspecified angina pectoris: Secondary | ICD-10-CM | POA: Diagnosis not present

## 2015-12-18 DIAGNOSIS — R202 Paresthesia of skin: Secondary | ICD-10-CM | POA: Diagnosis not present

## 2015-12-18 DIAGNOSIS — Z951 Presence of aortocoronary bypass graft: Secondary | ICD-10-CM | POA: Diagnosis not present

## 2015-12-18 DIAGNOSIS — Z91040 Latex allergy status: Secondary | ICD-10-CM | POA: Diagnosis not present

## 2015-12-18 DIAGNOSIS — Z8673 Personal history of transient ischemic attack (TIA), and cerebral infarction without residual deficits: Secondary | ICD-10-CM | POA: Insufficient documentation

## 2015-12-18 DIAGNOSIS — E119 Type 2 diabetes mellitus without complications: Secondary | ICD-10-CM | POA: Diagnosis not present

## 2015-12-18 DIAGNOSIS — Z862 Personal history of diseases of the blood and blood-forming organs and certain disorders involving the immune mechanism: Secondary | ICD-10-CM | POA: Insufficient documentation

## 2015-12-18 DIAGNOSIS — Z8619 Personal history of other infectious and parasitic diseases: Secondary | ICD-10-CM | POA: Diagnosis not present

## 2015-12-18 DIAGNOSIS — Z7982 Long term (current) use of aspirin: Secondary | ICD-10-CM | POA: Insufficient documentation

## 2015-12-18 DIAGNOSIS — Z880 Allergy status to penicillin: Secondary | ICD-10-CM | POA: Insufficient documentation

## 2015-12-18 DIAGNOSIS — F329 Major depressive disorder, single episode, unspecified: Secondary | ICD-10-CM | POA: Insufficient documentation

## 2015-12-18 DIAGNOSIS — Z9861 Coronary angioplasty status: Secondary | ICD-10-CM | POA: Insufficient documentation

## 2015-12-18 DIAGNOSIS — Z90710 Acquired absence of both cervix and uterus: Secondary | ICD-10-CM | POA: Diagnosis not present

## 2015-12-18 DIAGNOSIS — Z7902 Long term (current) use of antithrombotics/antiplatelets: Secondary | ICD-10-CM | POA: Insufficient documentation

## 2015-12-18 DIAGNOSIS — Z8742 Personal history of other diseases of the female genital tract: Secondary | ICD-10-CM | POA: Diagnosis not present

## 2015-12-18 DIAGNOSIS — N189 Chronic kidney disease, unspecified: Secondary | ICD-10-CM | POA: Insufficient documentation

## 2015-12-18 DIAGNOSIS — I509 Heart failure, unspecified: Secondary | ICD-10-CM | POA: Insufficient documentation

## 2015-12-18 DIAGNOSIS — I129 Hypertensive chronic kidney disease with stage 1 through stage 4 chronic kidney disease, or unspecified chronic kidney disease: Secondary | ICD-10-CM | POA: Diagnosis not present

## 2015-12-18 DIAGNOSIS — Z9889 Other specified postprocedural states: Secondary | ICD-10-CM | POA: Diagnosis not present

## 2015-12-18 DIAGNOSIS — Z87410 Personal history of cervical dysplasia: Secondary | ICD-10-CM | POA: Diagnosis not present

## 2015-12-18 DIAGNOSIS — Z86018 Personal history of other benign neoplasm: Secondary | ICD-10-CM | POA: Insufficient documentation

## 2015-12-18 DIAGNOSIS — I252 Old myocardial infarction: Secondary | ICD-10-CM | POA: Diagnosis not present

## 2015-12-18 DIAGNOSIS — Z794 Long term (current) use of insulin: Secondary | ICD-10-CM | POA: Diagnosis not present

## 2015-12-18 DIAGNOSIS — R079 Chest pain, unspecified: Secondary | ICD-10-CM | POA: Diagnosis present

## 2015-12-18 DIAGNOSIS — Z79899 Other long term (current) drug therapy: Secondary | ICD-10-CM | POA: Diagnosis not present

## 2015-12-18 DIAGNOSIS — R112 Nausea with vomiting, unspecified: Secondary | ICD-10-CM | POA: Insufficient documentation

## 2015-12-18 DIAGNOSIS — F419 Anxiety disorder, unspecified: Secondary | ICD-10-CM | POA: Insufficient documentation

## 2015-12-18 DIAGNOSIS — G43909 Migraine, unspecified, not intractable, without status migrainosus: Secondary | ICD-10-CM | POA: Insufficient documentation

## 2015-12-18 LAB — CBG MONITORING, ED: Glucose-Capillary: 152 mg/dL — ABNORMAL HIGH (ref 65–99)

## 2015-12-18 LAB — COMPREHENSIVE METABOLIC PANEL
ALBUMIN: 4.1 g/dL (ref 3.5–5.0)
ALK PHOS: 81 U/L (ref 38–126)
ALT: 27 U/L (ref 14–54)
AST: 38 U/L (ref 15–41)
Anion gap: 15 (ref 5–15)
BUN: 10 mg/dL (ref 6–20)
CALCIUM: 9.5 mg/dL (ref 8.9–10.3)
CO2: 30 mmol/L (ref 22–32)
CREATININE: 1.59 mg/dL — AB (ref 0.44–1.00)
Chloride: 97 mmol/L — ABNORMAL LOW (ref 101–111)
GFR calc non Af Amer: 34 mL/min — ABNORMAL LOW (ref >60)
GFR, EST AFRICAN AMERICAN: 39 mL/min — AB (ref >60)
Glucose, Bld: 173 mg/dL — ABNORMAL HIGH (ref 65–99)
Potassium: 3.7 mmol/L (ref 3.5–5.1)
SODIUM: 142 mmol/L (ref 135–145)
Total Bilirubin: 1.1 mg/dL (ref 0.3–1.2)
Total Protein: 7.5 g/dL (ref 6.5–8.1)

## 2015-12-18 LAB — LIPASE, BLOOD: Lipase: 18 U/L (ref 11–51)

## 2015-12-18 LAB — CBC
HCT: 34.7 % — ABNORMAL LOW (ref 36.0–46.0)
Hemoglobin: 11.6 g/dL — ABNORMAL LOW (ref 12.0–15.0)
MCH: 28.6 pg (ref 26.0–34.0)
MCHC: 33.4 g/dL (ref 30.0–36.0)
MCV: 85.5 fL (ref 78.0–100.0)
Platelets: 163 K/uL (ref 150–400)
RBC: 4.06 MIL/uL (ref 3.87–5.11)
RDW: 13.3 % (ref 11.5–15.5)
WBC: 10.6 K/uL — ABNORMAL HIGH (ref 4.0–10.5)

## 2015-12-18 LAB — PROTIME-INR
INR: 1.01 (ref 0.00–1.49)
Prothrombin Time: 13.5 seconds (ref 11.6–15.2)

## 2015-12-18 LAB — I-STAT TROPONIN, ED: TROPONIN I, POC: 0 ng/mL (ref 0.00–0.08)

## 2015-12-18 MED ORDER — ONDANSETRON 4 MG PO TBDP: 4.0000 mg | ORAL_TABLET | Freq: Three times a day (TID) | ORAL | Status: DC | PRN

## 2015-12-18 MED ORDER — SODIUM CHLORIDE 0.9 % IV BOLUS (SEPSIS)
1000.0000 mL | Freq: Once | INTRAVENOUS | Status: AC
  Administered 2015-12-18: 1000 mL via INTRAVENOUS

## 2015-12-18 MED ORDER — LORAZEPAM 2 MG/ML IJ SOLN
1.0000 mg | Freq: Once | INTRAMUSCULAR | Status: AC
  Administered 2015-12-18: 1 mg via INTRAVENOUS
  Filled 2015-12-18: qty 1

## 2015-12-18 MED ORDER — METOCLOPRAMIDE HCL 5 MG/ML IJ SOLN
10.0000 mg | Freq: Once | INTRAMUSCULAR | Status: AC
  Administered 2015-12-18: 10 mg via INTRAVENOUS
  Filled 2015-12-18: qty 2

## 2015-12-18 MED ORDER — DICYCLOMINE HCL 10 MG/ML IM SOLN
20.0000 mg | Freq: Once | INTRAMUSCULAR | Status: AC
  Administered 2015-12-18: 20 mg via INTRAMUSCULAR
  Filled 2015-12-18: qty 2

## 2015-12-18 NOTE — Discharge Instructions (Signed)
Abdominal Pain, Adult [name], take Zofran as needed for nausea. You need to see a primary care physician within 3 days for close follow-up. If any symptoms worsen come back to emergency department immediately. Thank you. Many things can cause belly (abdominal) pain. Most times, the belly pain is not dangerous. Many cases of belly pain can be watched and treated at home. HOME CARE   Do not take medicines that help you go poop (laxatives) unless told to by your doctor.  Only take medicine as told by your doctor.  Eat or drink as told by your doctor. Your doctor will tell you if you should be on a special diet. GET HELP IF:  You do not know what is causing your belly pain.  You have belly pain while you are sick to your stomach (nauseous) or have runny poop (diarrhea).  You have pain while you pee or poop.  Your belly pain wakes you up at night.  You have belly pain that gets worse or better when you eat.  You have belly pain that gets worse when you eat fatty foods.  You have a fever. GET HELP RIGHT AWAY IF:   The pain does not go away within 2 hours.  You keep throwing up (vomiting).  The pain changes and is only in the right or left part of the belly.  You have bloody or tarry looking poop. MAKE SURE YOU:   Understand these instructions.  Will watch your condition.  Will get help right away if you are not doing well or get worse.   This information is not intended to replace advice given to you by your health care provider. Make sure you discuss any questions you have with your health care provider.   Document Released: 04/12/2008 Document Revised: 11/15/2014 Document Reviewed: 07/04/2013 Elsevier Interactive Patient Education [employer].

## 2015-12-18 NOTE — ED Provider Notes (Signed)
CSN: [id number]     Arrival date & time 12/18/15  [id number] History  By signing my name below, I, Altamease Oiler, attest that this documentation has been prepared under the direction and in the presence of Everlene Balls, MD. Electronically Signed: Altamease Oiler, ED Scribe. 12/18/2015. 4:04 AM    Chief Complaint  Patient presents with  . Chest Pain  . Emesis  . Diarrhea   The history is provided by the patient. No language interpreter was used.   [name] is a 65 y.o. female with history of MI, ischemic cardiomyopathy, CHF, HTN, DM, and stroke who presents to the Emergency Department complaining of constant, stabbing, central chest pain with onset 3 hours ago. She states that she has had similar pain in the past with heart attacks and strokes.  Associated symptoms include N/V/D and tingling in the left arm. Pt denies SOB. She has had no suspicious food intake.    Past Medical History  Diagnosis Date  . Ischemic cardiomyopathy     status post biventricular ICD placed by DR Edumunds who used to see Dr Melvern Banker here to establish  cardiovascular care.  . Hypertension   . Coronary artery disease   . CHF (congestive heart failure) ([place])   . MVP (mitral valve prolapse)     Antibiotics not required for procedures  . Asthma   . Anxiety   . Depression   . Hiatal hernia   . Cervical dysplasia   . Fibroid   . Function kidney decreased   . History of shingles     2-3 yrs ago last out break "around waist"  . ICD (implantable cardiac defibrillator) in place   . Pacemaker   . Complication of anesthesia     "I wake up during surgeries" (02/14/2013)  . Anginal pain ([place])   . Myocardial infarction ([place])     "I've had 2; the others they were able to catch before completing" (02/14/2013)  . Pneumonia 1950's & 1985  . Type II diabetes mellitus ([place])   . Iron deficiency anemia   . GERD (gastroesophageal reflux disease)   . History of stomach ulcers   . Migraines   . Stroke [facility])     "2  confirmed; 9 TIA's; results in dragging LLE; numbness in tip of tongue" (02/14/2013),10-06-15 right hand tends to be weaker when tired.  . Shortness of breath     "lying down flat; at times w/exertion" (02/14/2013). 10-06-15 exertion only..  . Chronic kidney disease     Dr. Posey Pronto follows.  . Back pain     "pinched nerve-lower back" - Dr. Nelva Bush follows.  . Hepatitis     Hepatitis A -college yrs"water source exposure"  . AICD (automatic cardioverter/defibrillator) present     [employer]- Dr. Beckie Salts follows   Past Surgical History  Procedure Laterality Date  . Insert / replace / remove pacemaker      biventricular defibrillator--06/10/ 2009  . Abdominal hysterectomy  1985    TAH   . Cardiac defibrillator placement    . Coronary angioplasty with stent placement      "started out w/5; bypass corrected some; 1 stent since the bypass" (02/14/2013)  . Breast excisional biopsy Left 01/2007; 06/2007; 03/2008    "benign" (02/14/2013)  . Cardiac catheterization      "probably in the teens" (02/14/2013)  . Colonoscopy  ~ 2002  . Tee without cardioversion  11/14/2012    Procedure: TRANSESOPHAGEAL ECHOCARDIOGRAM (TEE);  Surgeon: Candee Furbish, MD;  Location: [facility]  ENDOSCOPY;  Service: Cardiovascular;  Laterality: N/A;  . Supraventricular tachycardia ablation  06/2007  . Biv icd genertaor change out  06/2007; 04/2008    "2 lead initial placement, at [facility]; done at [facility], after developing CHF" (02/14/2013)  . Coronary artery bypass graft  ` 1998    LIMA-LAD, SVG-D1, SVG-PDA  . Breast surgery    . Dilation and curettage of uterus  1975 X 2; 1976; 1977  . Left heart catheterization with coronary/graft angiogram N/A 01/03/2015    Procedure: LEFT HEART CATHETERIZATION WITH Beatrix Fetters;  Surgeon: Sinclair Grooms, MD;  Location[facility] C[facility] LAB;  Service: Cardiovascular;  Laterality: N/A;  . Biv pacemaker generator change out N/A 01/29/2015    Procedure: BIV PACEMAKER GENERATOR CHANGE OUT;  Surgeon: Evans Lance, MD;   Location: [facility] CATH LAB;  Service: Cardiovascular;  Laterality: N/A;  . Colonoscopy with propofol N/A 10/07/2015    Procedure: COLONOSCOPY WITH PROPOFOL;  Surgeon: Juanita Craver, MD;  Location: [facility] ENDOSCOPY;  Service: Endoscopy;  Laterality: N/A;   Family History  Problem Relation Age of Onset  . Heart disease Mother   . Hypertension Mother   . Heart attack Mother   . Stroke Mother   . Heart disease Father   . Hypertension Father   . Diabetes Father   . Kidney failure Father   . Heart attack Father   . Stroke Father   . Heart disease Brother   . Diabetes Brother   . Kidney failure Brother   . Heart attack Brother   . Diabetes Paternal Grandmother    Social History  Substance Use Topics  . Smoking status: Never Smoker   . Smokeless tobacco: Never Used  . Alcohol Use: No   OB History    Gravida Para Term Preterm AB TAB SAB Ectopic Multiple Living   7 2  2 5     1      Review of Systems  10 Systems reviewed and all are negative for acute change except as noted in the HPI.  Allergies  Calcium channel blockers; Codeine; Ticlid; Morphine and related; Tape; Digoxin and related; Metolazone; Myrbetriq; Onglyza; Penicillins; Spironolactone; Sulfa antibiotics; Tetracyclines & related; and Latex  Home Medications   Prior to Admission medications   Medication Sig Start Date End Date Taking? Authorizing Provider  albuterol (PROVENTIL HFA;VENTOLIN HFA) 108 (90 BASE) MCG/ACT inhaler Inhale 2 puffs into the lungs every 4 (four) hours as needed for wheezing or shortness of breath.    Historical Provider, MD  ALPRAZolam Duanne Moron) 0.5 MG tablet Take 1-2 tablets by mouth 3 (three) times daily. 2 tabs in the AM, 1 at noon, 1 at night 09/09/15   Historical Provider, MD  Artificial Tear GEL Apply 2 drops to eye daily as needed (dry eyes). \    Historical Provider, MD  aspirin 325 MG EC tablet Take 325 mg by mouth daily.      Historical Provider, MD  BIOTIN PO Take 10 mg by mouth daily. Biotin plus  keratin    Historical Provider, MD  cetirizine (ZYRTEC) 10 MG tablet Take 10 mg by mouth daily.    Historical Provider, MD  Cholecalciferol (VITAMIN D-3) 1000 UNITS CAPS Take 1 capsule by mouth at bedtime.     Historical Provider, MD  clopidogrel (PLAVIX) 75 MG tablet Take 1 tablet by mouth  daily 04/11/15   Belva Crome, MD  doxazosin (CARDURA) 4 MG tablet Take 4 mg by mouth at bedtime.     Historical  Provider, MD  Estradiol 10 MCG TABS vaginal tablet Place 1 tablet (10 mcg total) vaginally 2 (two) times a week. Patient taking differently: Place 1 tablet vaginally 2 (two) times a week. Sunday, Thursday 06/05/15   Terrance Mass, MD  ezetimibe (ZETIA) 10 MG tablet Take 1 tablet by mouth  daily 12/26/14   Belva Crome, MD  fluticasone [facility]) 50 MCG/ACT nasal spray Place 2 sprays into the nose daily as needed for allergies or rhinitis.     Historical Provider, MD  furosemide (LASIX) 80 MG tablet Take 1 tablet by mouth two  times daily 04/11/15   Belva Crome, MD  isosorbide mononitrate (IMDUR) 30 MG 24 hr tablet Take 1 tablet by mouth  daily 04/11/15   Belva Crome, MD  Lancets (FREESTYLE) lancets  09/09/15   Historical Provider, MD  LANTUS SOLOSTAR 100 UNIT/ML Solostar Pen Inject 18 Units into the skin at bedtime. 09/09/15   Historical Provider, MD  lidocaine (LIDODERM) 5 % Place 1 patch onto the skin every 12 (twelve) hours as needed.  06/19/15   Historical Provider, MD  metoprolol (LOPRESSOR) 100 MG tablet Take 1 tablet (100 mg total) by mouth 2 (two) times daily. 12/26/14   Belva Crome, MD  Multiple Vitamin (MULTIVITAMIN WITH MINERALS) TABS Take 1 tablet by mouth daily.    Historical Provider, MD  NITROSTAT 0.4 MG SL tablet Place 1 tablet under the tongue every 5 minutes as needed for chest pain, max 3 doses, go to er if no relief 11/18/14   Belva Crome, MD  ondansetron (ZOFRAN) 8 MG tablet Take 8 mg by mouth every 8 (eight) hours as needed. For nausea    Historical Provider, MD  pantoprazole  (PROTONIX) 40 MG tablet Take 40 mg by mouth daily.    Historical Provider, MD  PEG 3350-KCl-NaBcb-NaCl-NaSulf (PEG-3350/ELECTROLYTES) 236 G SOLR Take 0.5 Containers by mouth See admin instructions. Start first half at 6pm, second half at 2am 10/01/15   Historical Provider, MD  potassium chloride (K-DUR) 10 MEQ tablet Take 2 tablets (20 mEq total) by mouth 2 (two) times daily. 11/26/13   Belva Crome, MD  potassium chloride (K-DUR,KLOR-CON) 10 MEQ tablet Take 2 tablets by mouth two times daily 12/15/15   Belva Crome, MD  ramipril (ALTACE) 10 MG capsule Take 1 capsule by mouth  twice daily 04/11/15   Belva Crome, MD  simvastatin (ZOCOR) 20 MG tablet Take 1 tablet by mouth at  bedtime 10/06/15   Belva Crome, MD  sodium chloride (MURO 128) 5 % ophthalmic ointment Place 1 drop into both eyes at bedtime. For dry eyes    Historical Provider, MD  traMADol (ULTRAM) 50 MG tablet Take 100 mg by mouth every 6 (six) hours as needed (MIGRAINES).    Historical Provider, MD  venlafaxine (EFFEXOR) 100 MG tablet Take 100-200 mg by mouth See admin instructions. Take 2 tablets by mouth in the am 1 tablet by mouth in the pm 04/12/11   Historical Provider, MD   BP 150/89 mmHg  Pulse 89  Temp(Src) 99.8 F (37.7 C) (Oral)  Resp 18  SpO2 100% Physical Exam  Constitutional: She is oriented to person, place, and time. She appears well-developed and well-nourished. No distress.  HENT:  Head: Normocephalic and atraumatic.  Nose: Nose normal.  Mouth/Throat: Oropharynx is clear and moist. No oropharyngeal exudate.  Eyes: Conjunctivae and EOM are normal. Pupils are equal, round, and reactive to light. No scleral icterus.  Neck:  Normal range of motion. Neck supple. No JVD present. No tracheal deviation present. No thyromegaly present.  Cardiovascular: Normal rate, regular rhythm and normal heart sounds.  Exam reveals no gallop and no friction rub.   No murmur heard. Pulmonary/Chest: Effort normal and breath sounds normal.  No respiratory distress. She has no wheezes. She exhibits no tenderness.  Pacemaker noted to left chest  Abdominal: Soft. Bowel sounds are normal. She exhibits no distension and no mass. There is no tenderness. There is no rebound and no guarding.  Musculoskeletal: Normal range of motion. She exhibits no edema or tenderness.  Lymphadenopathy:    She has no cervical adenopathy.  Neurological: She is alert and oriented to person, place, and time. No cranial nerve deficit. She exhibits normal muscle tone.  Skin: Skin is warm and dry. No rash noted. No erythema. No pallor.  Nursing note and vitals reviewed.   ED Course  Procedures (including critical care time) DIAGNOSTIC STUDIES: Oxygen Saturation is 100% on RA,  normal by my interpretation.    COORDINATION OF CARE: 4:03 AM Discussed treatment plan which includes lab work, CXR, EKG with pt at bedside and pt agreed to plan.  Labs Review Labs Reviewed  COMPREHENSIVE METABOLIC PANEL - Abnormal; Notable for the following:    Chloride 97 (*)    Glucose, Bld 173 (*)    Creatinine, Ser 1.59 (*)    GFR calc non Af Amer 34 (*)    GFR calc Af Amer 39 (*)    All other components within normal limits  CBC - Abnormal; Notable for the following:    WBC 10.6 (*)    Hemoglobin 11.6 (*)    HCT 34.7 (*)    All other components within normal limits  CBG MONITORING, ED - Abnormal; Notable for the following:    Glucose-Capillary 152 (*)    All other components within normal limits  LIPASE, BLOOD  PROTIME-INR  URINALYSIS, ROUTINE W REFLEX MICROSCOPIC (NOT AT [facility])  Randolm Idol, ED    Imaging Review Dg Chest 2 View  12/18/2015  CLINICAL DATA:  Acute onset of generalized chest pain. Initial encounter. EXAM: CHEST  2 VIEW COMPARISON:  Chest radiograph performed 01/02/2015 FINDINGS: The lungs are well-aerated and clear. There is no evidence of focal opacification, pleural effusion or pneumothorax. The heart is borderline normal in size. The  patient is status post median sternotomy, with evidence of prior CABG. A pacemaker/AICD is noted at the left chest wall, with leads ending at the right atrium and right ventricle. No acute osseous abnormalities are seen. IMPRESSION: No acute cardiopulmonary process seen. Electronically Signed   By: Garald Balding M.D.   On: 12/18/2015 04:24   I have personally reviewed and evaluated these images and lab results as part of my medical decision-making.   EKG Interpretation   Date/Time:  Thursday December 18 2015 03:43:24 EST Ventricular Rate:  90 PR Interval:  130 QRS Duration: 148 QT Interval:  428 QTC Calculation: 523 R Axis:   57 Text Interpretation:  Atrial-sensed ventricular-paced rhythm Abnormal ECG  No significant change since last tracing Confirmed by Glynn Octave [phone]) on 12/18/2015 5:12:43 AM      MDM   Final diagnoses:  None    Patient presents emergency department for chest pain, abdominal pain, nausea, vomiting, and diarrhea. She has sick contacts upon repeat history taking of her daughter yesterday with similar symptoms. She was given Reglan and Ativan for her symptoms.  Also given Bentyl and a  liter of IV fluid. Patient states that she is feeling much better. Nausea has resolved.  Troponin was ordered by triage and not clinically indicated. EKG does not show any signs of ischemia. We'll discharge home with Zofran to take as needed. She appears well in no acute distress, vital signs were within her normal limits and she is safe for discharge.  I personally performed the services described in this documentation, which was scribed in my presence. The recorded information has been reviewed and is accurate.      Everlene Balls, MD 12/18/15 [phone]

## 2015-12-18 NOTE — ED Notes (Signed)
Patient transported to X-ray 

## 2015-12-18 NOTE — ED Notes (Signed)
Pt here for vomiting, diarrhea, chest pain and light headedness. Onset at 0100 this am.

## 2015-12-18 NOTE — ED Notes (Signed)
Pt departed in NAD.  

## 2015-12-22 DIAGNOSIS — K529 Noninfective gastroenteritis and colitis, unspecified: Secondary | ICD-10-CM | POA: Diagnosis not present

## 2016-01-01 ENCOUNTER — Telehealth: Payer: Self-pay | Admitting: Neurology

## 2016-01-01 ENCOUNTER — Observation Stay (HOSPITAL_COMMUNITY)
Admission: EM | Admit: 2016-01-01 | Discharge: 2016-01-04 | Disposition: A | Discharge status: Home / Self Care | Payer: Medicare Other | Attending: Family Medicine | Admitting: Family Medicine

## 2016-01-01 ENCOUNTER — Emergency Department (HOSPITAL_COMMUNITY): Payer: Medicare Other

## 2016-01-01 ENCOUNTER — Encounter (HOSPITAL_COMMUNITY): Payer: Self-pay

## 2016-01-01 DIAGNOSIS — E1122 Type 2 diabetes mellitus with diabetic chronic kidney disease: Secondary | ICD-10-CM | POA: Diagnosis not present

## 2016-01-01 DIAGNOSIS — E669 Obesity, unspecified: Secondary | ICD-10-CM | POA: Insufficient documentation

## 2016-01-01 DIAGNOSIS — Z8673 Personal history of transient ischemic attack (TIA), and cerebral infarction without residual deficits: Secondary | ICD-10-CM | POA: Insufficient documentation

## 2016-01-01 DIAGNOSIS — F419 Anxiety disorder, unspecified: Secondary | ICD-10-CM | POA: Diagnosis not present

## 2016-01-01 DIAGNOSIS — I255 Ischemic cardiomyopathy: Secondary | ICD-10-CM | POA: Insufficient documentation

## 2016-01-01 DIAGNOSIS — I5022 Chronic systolic (congestive) heart failure: Secondary | ICD-10-CM | POA: Diagnosis not present

## 2016-01-01 DIAGNOSIS — N189 Chronic kidney disease, unspecified: Secondary | ICD-10-CM

## 2016-01-01 DIAGNOSIS — I130 Hypertensive heart and chronic kidney disease with heart failure and stage 1 through stage 4 chronic kidney disease, or unspecified chronic kidney disease: Secondary | ICD-10-CM | POA: Diagnosis not present

## 2016-01-01 DIAGNOSIS — Z7982 Long term (current) use of aspirin: Secondary | ICD-10-CM | POA: Diagnosis not present

## 2016-01-01 DIAGNOSIS — Z955 Presence of coronary angioplasty implant and graft: Secondary | ICD-10-CM | POA: Diagnosis not present

## 2016-01-01 DIAGNOSIS — Z6828 Body mass index (BMI) 28.0-28.9, adult: Secondary | ICD-10-CM | POA: Insufficient documentation

## 2016-01-01 DIAGNOSIS — R208 Other disturbances of skin sensation: Secondary | ICD-10-CM | POA: Diagnosis not present

## 2016-01-01 DIAGNOSIS — Z79890 Hormone replacement therapy (postmenopausal): Secondary | ICD-10-CM | POA: Insufficient documentation

## 2016-01-01 DIAGNOSIS — R29818 Other symptoms and signs involving the nervous system: Secondary | ICD-10-CM | POA: Diagnosis not present

## 2016-01-01 DIAGNOSIS — F329 Major depressive disorder, single episode, unspecified: Secondary | ICD-10-CM | POA: Diagnosis not present

## 2016-01-01 DIAGNOSIS — Z7902 Long term (current) use of antithrombotics/antiplatelets: Secondary | ICD-10-CM | POA: Insufficient documentation

## 2016-01-01 DIAGNOSIS — I2510 Atherosclerotic heart disease of native coronary artery without angina pectoris: Secondary | ICD-10-CM | POA: Diagnosis not present

## 2016-01-01 DIAGNOSIS — I25810 Atherosclerosis of coronary artery bypass graft(s) without angina pectoris: Secondary | ICD-10-CM

## 2016-01-01 DIAGNOSIS — G4459 Other complicated headache syndrome: Secondary | ICD-10-CM | POA: Insufficient documentation

## 2016-01-01 DIAGNOSIS — Z79899 Other long term (current) drug therapy: Secondary | ICD-10-CM | POA: Diagnosis not present

## 2016-01-01 DIAGNOSIS — I252 Old myocardial infarction: Secondary | ICD-10-CM | POA: Insufficient documentation

## 2016-01-01 DIAGNOSIS — R200 Anesthesia of skin: Secondary | ICD-10-CM

## 2016-01-01 DIAGNOSIS — E119 Type 2 diabetes mellitus without complications: Secondary | ICD-10-CM | POA: Insufficient documentation

## 2016-01-01 DIAGNOSIS — I25709 Atherosclerosis of coronary artery bypass graft(s), unspecified, with unspecified angina pectoris: Secondary | ICD-10-CM | POA: Diagnosis present

## 2016-01-01 DIAGNOSIS — N183 Chronic kidney disease, stage 3 unspecified: Secondary | ICD-10-CM | POA: Diagnosis present

## 2016-01-01 DIAGNOSIS — R4781 Slurred speech: Secondary | ICD-10-CM | POA: Insufficient documentation

## 2016-01-01 DIAGNOSIS — Z794 Long term (current) use of insulin: Secondary | ICD-10-CM | POA: Insufficient documentation

## 2016-01-01 DIAGNOSIS — I10 Essential (primary) hypertension: Secondary | ICD-10-CM

## 2016-01-01 DIAGNOSIS — Z951 Presence of aortocoronary bypass graft: Secondary | ICD-10-CM | POA: Insufficient documentation

## 2016-01-01 DIAGNOSIS — E785 Hyperlipidemia, unspecified: Secondary | ICD-10-CM | POA: Insufficient documentation

## 2016-01-01 DIAGNOSIS — J45909 Unspecified asthma, uncomplicated: Secondary | ICD-10-CM | POA: Insufficient documentation

## 2016-01-01 DIAGNOSIS — K219 Gastro-esophageal reflux disease without esophagitis: Secondary | ICD-10-CM | POA: Insufficient documentation

## 2016-01-01 DIAGNOSIS — Z95810 Presence of automatic (implantable) cardiac defibrillator: Secondary | ICD-10-CM | POA: Diagnosis not present

## 2016-01-01 DIAGNOSIS — G459 Transient cerebral ischemic attack, unspecified: Principal | ICD-10-CM | POA: Insufficient documentation

## 2016-01-01 LAB — COMPREHENSIVE METABOLIC PANEL
ALT: 26 U/L (ref 14–54)
AST: 30 U/L (ref 15–41)
Albumin: 3.7 g/dL (ref 3.5–5.0)
Alkaline Phosphatase: 73 U/L (ref 38–126)
Anion gap: 11 (ref 5–15)
BILIRUBIN TOTAL: 0.5 mg/dL (ref 0.3–1.2)
BUN: 6 mg/dL (ref 6–20)
CHLORIDE: 105 mmol/L (ref 101–111)
CO2: 28 mmol/L (ref 22–32)
CREATININE: 1.62 mg/dL — AB (ref 0.44–1.00)
Calcium: 9.3 mg/dL (ref 8.9–10.3)
GFR, EST AFRICAN AMERICAN: 38 mL/min — AB (ref >60)
GFR, EST NON AFRICAN AMERICAN: 33 mL/min — AB (ref >60)
Glucose, Bld: 156 mg/dL — ABNORMAL HIGH (ref 65–99)
POTASSIUM: 4.3 mmol/L (ref 3.5–5.1)
Sodium: 144 mmol/L (ref 135–145)
TOTAL PROTEIN: 6.9 g/dL (ref 6.5–8.1)

## 2016-01-01 LAB — RAPID URINE DRUG SCREEN, HOSP PERFORMED
Amphetamines: NOT DETECTED
BENZODIAZEPINES: POSITIVE — AB
Barbiturates: NOT DETECTED
Cocaine: NOT DETECTED
Opiates: NOT DETECTED
Tetrahydrocannabinol: NOT DETECTED

## 2016-01-01 LAB — URINALYSIS, ROUTINE W REFLEX MICROSCOPIC
BILIRUBIN URINE: NEGATIVE
GLUCOSE, UA: NEGATIVE mg/dL
HGB URINE DIPSTICK: NEGATIVE
KETONES UR: NEGATIVE mg/dL
LEUKOCYTES UA: NEGATIVE
NITRITE: NEGATIVE
PROTEIN: NEGATIVE mg/dL
Specific Gravity, Urine: 1.008 (ref 1.005–1.030)
pH: 7.5 (ref 5.0–8.0)

## 2016-01-01 LAB — I-STAT CHEM 8, ED
BUN: 12 mg/dL (ref 6–20)
CALCIUM ION: 1.12 mmol/L — AB (ref 1.13–1.30)
CREATININE: 0.7 mg/dL (ref 0.44–1.00)
Chloride: 103 mmol/L (ref 101–111)
GLUCOSE: 129 mg/dL — AB (ref 65–99)
HCT: 41 % (ref 36.0–46.0)
HEMOGLOBIN: 13.9 g/dL (ref 12.0–15.0)
Potassium: 3.6 mmol/L (ref 3.5–5.1)
Sodium: 140 mmol/L (ref 135–145)
TCO2: 25 mmol/L (ref 0–100)

## 2016-01-01 LAB — CBC
HEMATOCRIT: 35.4 % — AB (ref 36.0–46.0)
Hemoglobin: 11.7 g/dL — ABNORMAL LOW (ref 12.0–15.0)
MCH: 28.5 pg (ref 26.0–34.0)
MCHC: 33.1 g/dL (ref 30.0–36.0)
MCV: 86.3 fL (ref 78.0–100.0)
Platelets: 234 10*3/uL (ref 150–400)
RBC: 4.1 MIL/uL (ref 3.87–5.11)
RDW: 13.7 % (ref 11.5–15.5)
WBC: 6.5 10*3/uL (ref 4.0–10.5)

## 2016-01-01 LAB — DIFFERENTIAL
BASOS PCT: 1 %
Basophils Absolute: 0 10*3/uL (ref 0.0–0.1)
EOS ABS: 0.1 10*3/uL (ref 0.0–0.7)
Eosinophils Relative: 1 %
Lymphocytes Relative: 38 %
Lymphs Abs: 2.5 10*3/uL (ref 0.7–4.0)
MONO ABS: 0.4 10*3/uL (ref 0.1–1.0)
MONOS PCT: 6 %
Neutro Abs: 3.6 10*3/uL (ref 1.7–7.7)
Neutrophils Relative %: 54 %

## 2016-01-01 LAB — PROTIME-INR
INR: 1.05 (ref 0.00–1.49)
Prothrombin Time: 13.9 seconds (ref 11.6–15.2)

## 2016-01-01 LAB — APTT: aPTT: 28 seconds (ref 24–37)

## 2016-01-01 LAB — I-STAT TROPONIN, ED: TROPONIN I, POC: 0 ng/mL (ref 0.00–0.08)

## 2016-01-01 MED ORDER — ACETAMINOPHEN 325 MG PO TABS
650.0000 mg | ORAL_TABLET | Freq: Once | ORAL | Status: AC
  Administered 2016-01-01: 650 mg via ORAL
  Filled 2016-01-01: qty 2

## 2016-01-01 NOTE — H&P (Signed)
[facility] History and Physical  [name] [id number] DOB: Mar 11, 1951 DOA: 01/01/2016  Referring physician: Dr. Tamera Punt PCP: Reginia Naas, MD   Chief Complaint: R face/ tongue weakness  HPI: [name] is a 65 y.o. female with hx of MI/ CABG/ ICM, HTN, DM2, TIA / CVA in the past, systolic CHF, anxiety/ depression.  She presented to ED with R tongue then facial numbness.  Possbily some slurred speech. Seen by neurology and NIHSS score "0" per neurology, no TPA given.  They recommend overnight observation.  Patient says her symptoms are better but not gone.   In 2011 had TIA w numbness of tongue/ mouth, blurred vision, resolved. CT head/ neck negative. MRI not done d/t ICD.   In Jan 2014 had acute LLE/ LUE weakness which rapidly improved, did not get TPA. DC'd on plavix and ASA, TEE neg.  In Apr 2014 had L hand/ foot weakness.  No TPA d/t minmial findings and recent CVA.     Chart review: Jul 08 - chest pain, hx CABG, ICM, PSVT, DM2, HTN, anxiety/ depression > heart cath Nov 08 - a/c CHF, ICM EF 30%, CABG, hx ablation for SVT, AICD/ [employer],NIDDM May 09 - a/c CHF, ICM, SVT Jul 09 - severe ICM EF 15%, CHF NHYA class IV, severe MR, HTN, DM, HL, ICD Dec 2011 - TIA w numbness of tongue/ mouth and blurred vision; resolved by DC Jan 2014 - LUE/ LLE weakness/ dysartrhia > CT head neg, rapid improvement in hospital. No MRI d/t ICD. CTA neck/ brain neg for sig stenosis. DC'd on plavix and ASA. TEE neg.  Apr 2014 - CP and L hand/ foot numbness/ weakness.  Code stroke called, no TPA per neuro d/t minimal symptoms and recent CVA. No pradaxa given CKD.  No MRI d/t ICD.   Feb 2016 - chest pain, +trop.  Had cath which showed severe native 3VDz w patent bypass grafts.  Likely small vessel disease, medical Rx. EF 35%.    ROS  no CP  no abd pain  no SOB/ orthopnea  no leg swelling  no n/v/d  no fever  no chills  Where does patient live home Can patient participate  in ADLs? yes  Past Medical History  Past Medical History  Diagnosis Date  . Ischemic cardiomyopathy     status post biventricular ICD placed by DR Edumunds who used to see Dr Melvern Banker here to establish  cardiovascular care.  . Hypertension   . Coronary artery disease   . CHF (congestive heart failure) ([place])   . MVP (mitral valve prolapse)     Antibiotics not required for procedures  . Asthma   . Anxiety   . Depression   . Hiatal hernia   . Cervical dysplasia   . Fibroid   . Function kidney decreased   . History of shingles     2-3 yrs ago last out break "around waist"  . ICD (implantable cardiac defibrillator) in place   . Pacemaker   . Complication of anesthesia     "I wake up during surgeries" (02/14/2013)  . Anginal pain ([place])   . Myocardial infarction ([place])     "I've had 2; the others they were able to catch before completing" (02/14/2013)  . Pneumonia 1950's & 1985  . Type II diabetes mellitus ([place])   . Iron deficiency anemia   . GERD (gastroesophageal reflux disease)   . History of stomach ulcers   . Migraines   . Stroke [facility])     "  2 confirmed; 9 TIA's; results in dragging LLE; numbness in tip of tongue" (02/14/2013),10-06-15 right hand tends to be weaker when tired.  . Shortness of breath     "lying down flat; at times w/exertion" (02/14/2013). 10-06-15 exertion only..  . Chronic kidney disease     Dr. Posey Pronto follows.  . Back pain     "pinched nerve-lower back" - Dr. Nelva Bush follows.  . Hepatitis     Hepatitis A -college yrs"water source exposure"  . AICD (automatic cardioverter/defibrillator) present     [employer]- Dr. Beckie Salts follows   Past Surgical History  Past Surgical History  Procedure Laterality Date  . Insert / replace / remove pacemaker      biventricular defibrillator--06/10/ 2009  . Abdominal hysterectomy  1985    TAH   . Cardiac defibrillator placement    . Coronary angioplasty with stent placement      "started out w/5; bypass corrected some; 1 stent  since the bypass" (02/14/2013)  . Breast excisional biopsy Left 01/2007; 06/2007; 03/2008    "benign" (02/14/2013)  . Cardiac catheterization      "probably in the teens" (02/14/2013)  . Colonoscopy  ~ 2002  . Tee without cardioversion  11/14/2012    Procedure: TRANSESOPHAGEAL ECHOCARDIOGRAM (TEE);  Surgeon: Candee Furbish, MD;  Location: [facility] ENDOSCOPY;  Service: Cardiovascular;  Laterality: N/A;  . Supraventricular tachycardia ablation  06/2007  . Biv icd genertaor change out  06/2007; 04/2008    "2 lead initial placement, at [facility]; done at [facility], after developing CHF" (02/14/2013)  . Coronary artery bypass graft  ` 1998    LIMA-LAD, SVG-D1, SVG-PDA  . Breast surgery    . Dilation and curettage of uterus  1975 X 2; 1976; 1977  . Left heart catheterization with coronary/graft angiogram N/A 01/03/2015    Procedure: LEFT HEART CATHETERIZATION WITH Beatrix Fetters;  Surgeon: Sinclair Grooms, MD;  Location: [facility] [place] CATH LAB;  Service: Cardiovascular;  Laterality: N/A;  . Biv pacemaker generator change out N/A 01/29/2015    Procedure: BIV PACEMAKER GENERATOR CHANGE OUT;  Surgeon: Evans Lance, MD;  Location: [facility] CATH LAB;  Service: Cardiovascular;  Laterality: N/A;  . Colonoscopy with propofol N/A 10/07/2015    Procedure: COLONOSCOPY WITH PROPOFOL;  Surgeon: Juanita Craver, MD;  Location: [facility] ENDOSCOPY;  Service: Endoscopy;  Laterality: N/A;   Family History  Family History  Problem Relation Age of Onset  . Heart disease Mother   . Hypertension Mother   . Heart attack Mother   . Stroke Mother   . Heart disease Father   . Hypertension Father   . Diabetes Father   . Kidney failure Father   . Heart attack Father   . Stroke Father   . Heart disease Brother   . Diabetes Brother   . Kidney failure Brother   . Heart attack Brother   . Diabetes Paternal Grandmother    Social History  reports that she has never smoked. She has never used smokeless tobacco. She reports that she does not drink alcohol or use  illicit drugs. Allergies  Allergies  Allergen Reactions  . Calcium Channel Blockers Other (See Comments)    Cannot take non-DHP CCBs (diltiazem, verapamil) due to CHF requiring ICD  . Codeine Anaphylaxis  . Ticlid [Ticlopidine Hcl] Other (See Comments)    Unprovoked bleeding while on Ticlid (doesn't think she was on ASA at the time) Takes Plavix without problems  . Morphine And Related Other (See Comments)    Severe  AMS, confusion, weakness Tolerates Fentanyl, Tramadol  . Tape Dermatitis and Rash    Tolerates paper tape  . Digoxin And Related Nausea Only    Unknown  . Metolazone Other (See Comments)    Cannot remember reaction  . Myrbetriq [Mirabegron] Other (See Comments)    Aggravates migraines  . Onglyza [Saxagliptin] Other (See Comments)    Exacerbates migraines  . Penicillins Hives    Has patient had a PCN reaction causing immediate rash, facial/tongue/throat swelling, SOB or lightheadedness with hypotension: No Has patient had a PCN reaction causing severe rash involving mucus membranes or skin necrosis: No Has patient had a PCN reaction that required hospitalization No Has patient had a PCN reaction occurring within the last 10 years: No If all of the above answers are "NO", then may proceed with Cephalosporin use.   [name] Spironolactone Other (See Comments)    Cannot remember reaction  . Sulfa Antibiotics Hives  . Tetracyclines & Related Other (See Comments)    Cannot remember reaction Tolerates macrolides (azithromycin)  . Latex Itching and Rash   Home medications Prior to Admission medications   Medication Sig Start Date End Date Taking? Authorizing Provider  albuterol (PROVENTIL HFA;VENTOLIN HFA) 108 (90 BASE) MCG/ACT inhaler Inhale 2 puffs into the lungs every 4 (four) hours as needed for wheezing or shortness of breath.   Yes Historical Provider, MD  ALPRAZolam Duanne Moron) 0.5 MG tablet Take 1-2 tablets by mouth 3 (three) times daily. 2 tabs in the AM, 1 at noon, 1 at  night 09/09/15  Yes Historical Provider, MD  Artificial Tear GEL Apply 2 drops to eye daily as needed (dry eyes). \   Yes Historical Provider, MD  aspirin 325 MG EC tablet Take 325 mg by mouth daily.     Yes Historical Provider, MD  BIOTIN PO Take 10 mg by mouth daily. Biotin plus keratin   Yes Historical Provider, MD  cetirizine (ZYRTEC) 10 MG tablet Take 10 mg by mouth daily.   Yes Historical Provider, MD  Cholecalciferol (VITAMIN D-3) 1000 UNITS CAPS Take 1 capsule by mouth daily.    Yes Historical Provider, MD  clopidogrel (PLAVIX) 75 MG tablet Take 1 tablet by mouth  daily 04/11/15  Yes Belva Crome, MD  doxazosin (CARDURA) 4 MG tablet Take 4 mg by mouth at bedtime.    Yes Historical Provider, MD  Estradiol 10 MCG TABS vaginal tablet Place 1 tablet (10 mcg total) vaginally 2 (two) times a week. Patient taking differently: Place 1 tablet vaginally 2 (two) times a week. Sunday, Thursday 06/05/15  Yes Terrance Mass, MD  ezetimibe (ZETIA) 10 MG tablet Take 1 tablet by mouth  daily 12/26/14  Yes Belva Crome, MD  fluticasone [facility]) 50 MCG/ACT nasal spray Place 2 sprays into the nose daily as needed for allergies or rhinitis.    Yes Historical Provider, MD  furosemide (LASIX) 80 MG tablet Take 1 tablet by mouth two  times daily 04/11/15  Yes Belva Crome, MD  isosorbide mononitrate (IMDUR) 30 MG 24 hr tablet Take 1 tablet by mouth  daily 04/11/15  Yes Belva Crome, MD  LANTUS SOLOSTAR 100 UNIT/ML Solostar Pen Inject 18 Units into the skin at bedtime. 09/09/15  Yes Historical Provider, MD  metoprolol (LOPRESSOR) 100 MG tablet Take 1 tablet (100 mg total) by mouth 2 (two) times daily. 12/26/14  Yes Belva Crome, MD  Multiple Vitamin (MULTIVITAMIN WITH MINERALS) TABS Take 1 tablet by mouth daily.   Yes  Historical Provider, MD  NITROSTAT 0.4 MG SL tablet Place 1 tablet under the tongue every 5 minutes as needed for chest pain, max 3 doses, go to er if no relief 11/18/14  Yes Belva Crome, MD   ondansetron (ZOFRAN ODT) 4 MG disintegrating tablet Take 1 tablet (4 mg total) by mouth every 8 (eight) hours as needed for nausea or vomiting. 12/18/15  Yes Everlene Balls, MD  ondansetron (ZOFRAN) 8 MG tablet Take 8 mg by mouth every 8 (eight) hours as needed. For nausea   Yes Historical Provider, MD  pantoprazole (PROTONIX) 40 MG tablet Take 40 mg by mouth daily.   Yes Historical Provider, MD  potassium chloride (K-DUR) 10 MEQ tablet Take 2 tablets (20 mEq total) by mouth 2 (two) times daily. 11/26/13  Yes Belva Crome, MD  ramipril (ALTACE) 10 MG capsule Take 1 capsule by mouth  twice daily 04/11/15  Yes Belva Crome, MD  simvastatin (ZOCOR) 20 MG tablet Take 1 tablet by mouth at  bedtime 10/06/15  Yes Belva Crome, MD  sodium chloride (MURO 128) 5 % ophthalmic ointment Place 1 drop into both eyes at bedtime. For dry eyes   Yes Historical Provider, MD  traMADol (ULTRAM) 50 MG tablet Take 100 mg by mouth every 6 (six) hours as needed (MIGRAINES).   Yes Historical Provider, MD  venlafaxine (EFFEXOR) 100 MG tablet Take 100-200 mg by mouth See admin instructions. Take 2 tablets by mouth in the am 1 tablet by mouth in the pm 04/12/11  Yes Historical Provider, MD   Liver Function Tests  Recent Labs Lab 01/01/16 1608  AST 30  ALT 26  ALKPHOS 73  BILITOT 0.5  PROT 6.9  ALBUMIN 3.7   No results for input(s): LIPASE, AMYLASE in the last 168 hours. CBC  Recent Labs Lab 01/01/16 1608 01/01/16 1621  WBC 6.5  --   NEUTROABS 3.6  --   HGB 11.7* 13.9  HCT 35.4* 41.0  MCV 86.3  --   PLT 234  --    Basic Metabolic Panel  Recent Labs Lab 01/01/16 1608 01/01/16 1621  NA 144 140  K 4.3 3.6  CL 105 103  CO2 28  --   GLUCOSE 156* 129*  BUN 6 12  CREATININE 1.62* 0.70  CALCIUM 9.3  --      Filed Vitals:   01/01/16 1848 01/01/16 1900 01/01/16 1915 01/01/16 1930  BP:  145/83 138/84 131/73  Pulse:  63 67 71  Temp: 99.8 F (37.7 C)     Resp:  20 11 17   SpO2:  99% 96% 99%    Exam: Alert no distress No rash, cyanosis or gangrene Sclera anicteric, throat clear No jvd or bruits Chest clear bilat RRR no MRG Abd obese soft ntnd no mass or ascites GU defer MS no joint effusion/ deform Neuro alert ox 3, pleasant Mild RLE weakness on SLR compared to R RUE/ LUE symmetric strength all groups Good sensation on face bilat, no facial droop CN's 2-12 intact including VF's  EKG (independently reviewed) > NSR , RBBB Head CT > no acute findings, old lacune L caudate  Home medications > albuterol, xanax, asa, plavix, cardura, estradiol, Zetia, flonase, lasix, Imdur, Lantus, Lopressor, MVI, SL NTG, zofran, protonix, KDur, Altace, Zocor, tramadol, Effexor  Na 144  K 4.3  Creat 1.62  Assessment: 1. R tongue and facial numbness - improved symptoms but not completely. Low NIHSS score ("0" per neurology") so patient did not receive  TPA.  Neuro recommending overnight observation. Not MRI candidate. Hx TIA's/ CVA in the past but w/o severe sequelae. 2. HTN 3. DM2 on insulin 4. CAD hx CABG 5. ICM EF 35% , compensated on lasix 6. Anxiety/ depression - stable  7. CKD 3 - creat 1.6 at baseline  Plan - admit telemetry, stroke team to follow.    DVT Prophylaxis lovenox CKD dose  Code Status: full  Family Communication: husband at bedside  Disposition Plan: home when better    Sol Blazing [facility] Pager [phone]  Cell [phone]  If 7PM-7AM, please contact night-coverage [email or web address] Password [facility] 01/01/2016, 6:27 PM

## 2016-01-01 NOTE — ED Provider Notes (Signed)
CSN: [id number]     Arrival date & time 01/01/16  1523 History   First MD Initiated Contact with Patient 01/01/16 1530     Chief Complaint  Patient presents with  . Cerebrovascular Accident     (Consider location/radiation/quality/duration/timing/severity/associated sxs/prior Treatment) HPI Comments: Patient with a history of hypertension, coronary artery disease, CHF, anxiety and TIAs presents with right-sided facial numbness. She states that she sleeps most of the normally. She states that she was fairly restless last night. She was text seen someone at 1:00 PM today and she was normal and at baseline. At 2:30 PM she woke back up and noted that her right tongue was numb. She states the numbness progressed to the right cheek and right lower side of her face. She states she's had some slurred speech. She states her symptoms are little bit better but still persistent. She has a right-sided headache. She denies any numbness or weakness to her extremities. She states that she's always a little bit off balance but states that that is unchanged from her baseline. Code Stroke was activated in triaged.  She states that she's had a history of TIAs in the past. She currently is seeing a neurologist for this.  Patient is a 65 y.o. female presenting with Acute Neurological Problem.  Cerebrovascular Accident Associated symptoms include headaches. Pertinent negatives include no chest pain, no abdominal pain and no shortness of breath.    Past Medical History  Diagnosis Date  . Ischemic cardiomyopathy     status post biventricular ICD placed by DR Edumunds who used to see Dr Melvern Banker here to establish  cardiovascular care.  . Hypertension   . Coronary artery disease   . CHF (congestive heart failure) ([place])   . MVP (mitral valve prolapse)     Antibiotics not required for procedures  . Asthma   . Anxiety   . Depression   . Hiatal hernia   . Cervical dysplasia   . Fibroid   . Function kidney decreased    . History of shingles     2-3 yrs ago last out break "around waist"  . ICD (implantable cardiac defibrillator) in place   . Pacemaker   . Complication of anesthesia     "I wake up during surgeries" (02/14/2013)  . Anginal pain ([place])   . Myocardial infarction ([place])     "I've had 2; the others they were able to catch before completing" (02/14/2013)  . Pneumonia 1950's & 1985  . Type II diabetes mellitus ([place])   . Iron deficiency anemia   . GERD (gastroesophageal reflux disease)   . History of stomach ulcers   . Migraines   . Stroke [facility])     "2 confirmed; 9 TIA's; results in dragging LLE; numbness in tip of tongue" (02/14/2013),10-06-15 right hand tends to be weaker when tired.  . Shortness of breath     "lying down flat; at times w/exertion" (02/14/2013). 10-06-15 exertion only..  . Chronic kidney disease     Dr. Posey Pronto follows.  . Back pain     "pinched nerve-lower back" - Dr. Nelva Bush follows.  . Hepatitis     Hepatitis A -college yrs"water source exposure"  . AICD (automatic cardioverter/defibrillator) present     [employer]- Dr. Beckie Salts follows   Past Surgical History  Procedure Laterality Date  . Insert / replace / remove pacemaker      biventricular defibrillator--06/10/ 2009  . Abdominal hysterectomy  1985    TAH   . Cardiac defibrillator  placement    . Coronary angioplasty with stent placement      "started out w/5; bypass corrected some; 1 stent since the bypass" (02/14/2013)  . Breast excisional biopsy Left 01/2007; 06/2007; 03/2008    "benign" (02/14/2013)  . Cardiac catheterization      "probably in the teens" (02/14/2013)  . Colonoscopy  ~ 2002  . Tee without cardioversion  11/14/2012    Procedure: TRANSESOPHAGEAL ECHOCARDIOGRAM (TEE);  Surgeon: Candee Furbish, MD;  Location: [facility] ENDOSCOPY;  Service: Cardiovascular;  Laterality: N/A;  . Supraventricular tachycardia ablation  06/2007  . Biv icd genertaor change out  06/2007; 04/2008    "2 lead initial placement, at [facility]; done at [facility],  after developing CHF" (02/14/2013)  . Coronary artery bypass graft  ` 1998    LIMA-LAD, SVG-D1, SVG-PDA  . Breast surgery    . Dilation and curettage of uterus  1975 X 2; 1976; 1977  . Left heart catheterization with coronary/graft angiogram N/A 01/03/2015    Procedure: LEFT HEART CATHETERIZATION WITH Beatrix Fetters;  Surgeon: Sinclair Grooms, MD;  Location: [facility] CATH LAB;  Service: Cardiovascular;  Laterality: N/A;  . Biv pacemaker generator change out N/A 01/29/2015    Procedure: BIV PACEMAKER GENERATOR CHANGE OUT;  Surgeon: Evans Lance, MD;  Location: [facility] CATH LAB;  Service: Cardiovascular;  Laterality: N/A;  . Colonoscopy with propofol N/A 10/07/2015    Procedure: COLONOSCOPY WITH PROPOFOL;  Surgeon: Juanita Craver, MD;  Location: [facility] ENDOSCOPY;  Service: Endoscopy;  Laterality: N/A;   Family History  Problem Relation Age of Onset  . Heart disease Mother   . Hypertension Mother   . Heart attack Mother   . Stroke Mother   . Heart disease Father   . Hypertension Father   . Diabetes Father   . Kidney failure Father   . Heart attack Father   . Stroke Father   . Heart disease Brother   . Diabetes Brother   . Kidney failure Brother   . Heart attack Brother   . Diabetes Paternal Grandmother    Social History  Substance Use Topics  . Smoking status: Never Smoker   . Smokeless tobacco: Never Used  . Alcohol Use: No   OB History    Gravida Para Term Preterm AB TAB SAB Ectopic Multiple Living   7 2  2 5     1      Review of Systems  Constitutional: Positive for fatigue. Negative for fever, chills and diaphoresis.  HENT: Negative for congestion, rhinorrhea and sneezing.   Eyes: Negative.   Respiratory: Negative for cough, chest tightness and shortness of breath.   Cardiovascular: Negative for chest pain and leg swelling.  Gastrointestinal: Negative for nausea, vomiting, abdominal pain, diarrhea and blood in stool.  Genitourinary: Negative for frequency, hematuria, flank  pain and difficulty urinating.  Musculoskeletal: Negative for back pain and arthralgias.  Skin: Negative for rash.  Neurological: Positive for speech difficulty, numbness and headaches. Negative for dizziness and weakness.      Allergies  Calcium channel blockers; Codeine; Ticlid; Morphine and related; Tape; Digoxin and related; Metolazone; Myrbetriq; Onglyza; Penicillins; Spironolactone; Sulfa antibiotics; Tetracyclines & related; and Latex  Home Medications   Prior to Admission medications   Medication Sig Start Date End Date Taking? Authorizing Provider  albuterol (PROVENTIL HFA;VENTOLIN HFA) 108 (90 BASE) MCG/ACT inhaler Inhale 2 puffs into the lungs every 4 (four) hours as needed for wheezing or shortness of breath.   Yes Historical Provider, MD  ALPRAZolam (  XANAX) 0.5 MG tablet Take 1-2 tablets by mouth 3 (three) times daily. 2 tabs in the AM, 1 at noon, 1 at night 09/09/15  Yes Historical Provider, MD  Artificial Tear GEL Apply 2 drops to eye daily as needed (dry eyes). \   Yes Historical Provider, MD  aspirin 325 MG EC tablet Take 325 mg by mouth daily.     Yes Historical Provider, MD  BIOTIN PO Take 10 mg by mouth daily. Biotin plus keratin   Yes Historical Provider, MD  cetirizine (ZYRTEC) 10 MG tablet Take 10 mg by mouth daily.   Yes Historical Provider, MD  Cholecalciferol (VITAMIN D-3) 1000 UNITS CAPS Take 1 capsule by mouth daily.    Yes Historical Provider, MD  clopidogrel (PLAVIX) 75 MG tablet Take 1 tablet by mouth  daily 04/11/15  Yes Belva Crome, MD  doxazosin (CARDURA) 4 MG tablet Take 4 mg by mouth at bedtime.    Yes Historical Provider, MD  Estradiol 10 MCG TABS vaginal tablet Place 1 tablet (10 mcg total) vaginally 2 (two) times a week. Patient taking differently: Place 1 tablet vaginally 2 (two) times a week. Sunday, Thursday 06/05/15  Yes Terrance Mass, MD  ezetimibe (ZETIA) 10 MG tablet Take 1 tablet by mouth  daily 12/26/14  Yes Belva Crome, MD  fluticasone  [facility]) 50 MCG/ACT nasal spray Place 2 sprays into the nose daily as needed for allergies or rhinitis.    Yes Historical Provider, MD  furosemide (LASIX) 80 MG tablet Take 1 tablet by mouth two  times daily 04/11/15  Yes Belva Crome, MD  isosorbide mononitrate (IMDUR) 30 MG 24 hr tablet Take 1 tablet by mouth  daily 04/11/15  Yes Belva Crome, MD  LANTUS SOLOSTAR 100 UNIT/ML Solostar Pen Inject 18 Units into the skin at bedtime. 09/09/15  Yes Historical Provider, MD  metoprolol (LOPRESSOR) 100 MG tablet Take 1 tablet (100 mg total) by mouth 2 (two) times daily. 12/26/14  Yes Belva Crome, MD  Multiple Vitamin (MULTIVITAMIN WITH MINERALS) TABS Take 1 tablet by mouth daily.   Yes Historical Provider, MD  NITROSTAT 0.4 MG SL tablet Place 1 tablet under the tongue every 5 minutes as needed for chest pain, max 3 doses, go to er if no relief 11/18/14  Yes Belva Crome, MD  ondansetron (ZOFRAN ODT) 4 MG disintegrating tablet Take 1 tablet (4 mg total) by mouth every 8 (eight) hours as needed for nausea or vomiting. 12/18/15  Yes Everlene Balls, MD  ondansetron (ZOFRAN) 8 MG tablet Take 8 mg by mouth every 8 (eight) hours as needed. For nausea   Yes Historical Provider, MD  pantoprazole (PROTONIX) 40 MG tablet Take 40 mg by mouth daily.   Yes Historical Provider, MD  potassium chloride (K-DUR) 10 MEQ tablet Take 2 tablets (20 mEq total) by mouth 2 (two) times daily. 11/26/13  Yes Belva Crome, MD  ramipril (ALTACE) 10 MG capsule Take 1 capsule by mouth  twice daily 04/11/15  Yes Belva Crome, MD  simvastatin (ZOCOR) 20 MG tablet Take 1 tablet by mouth at  bedtime 10/06/15  Yes Belva Crome, MD  sodium chloride (MURO 128) 5 % ophthalmic ointment Place 1 drop into both eyes at bedtime. For dry eyes   Yes Historical Provider, MD  traMADol (ULTRAM) 50 MG tablet Take 100 mg by mouth every 6 (six) hours as needed (MIGRAINES).   Yes Historical Provider, MD  venlafaxine (EFFEXOR) 100 MG tablet  Take 100-200 mg by mouth See  admin instructions. Take 2 tablets by mouth in the am 1 tablet by mouth in the pm 04/12/11  Yes Historical Provider, MD   BP 134/77 mmHg  Pulse 62  Resp 12  SpO2 98% Physical Exam  Constitutional: She is oriented to person, place, and time. She appears well-developed and well-nourished.  HENT:  Head: Normocephalic and atraumatic.  Eyes: Pupils are equal, round, and reactive to light.  Neck: Normal range of motion. Neck supple.  Cardiovascular: Normal rate, regular rhythm and normal heart sounds.   Pulmonary/Chest: Effort normal and breath sounds normal. No respiratory distress. She has no wheezes. She has no rales. She exhibits no tenderness.  Abdominal: Soft. Bowel sounds are normal. There is no tenderness. There is no rebound and no guarding.  Musculoskeletal: Normal range of motion. She exhibits no edema.  Lymphadenopathy:    She has no cervical adenopathy.  Neurological: She is alert and oriented to person, place, and time. She has normal strength. No sensory deficit. GCS eye subscore is 4. GCS verbal subscore is 5. GCS motor subscore is 6.  Subjective numbness to LT to right lower face.  Slight drooping of corner of right mouth, seems to clear when pt smiles  Skin: Skin is warm and dry. No rash noted.  Psychiatric: She has a normal mood and affect.    ED Course  Procedures (including critical care time) Labs Review Labs Reviewed  CBC - Abnormal; Notable for the following:    Hemoglobin 11.7 (*)    HCT 35.4 (*)    All other components within normal limits  COMPREHENSIVE METABOLIC PANEL - Abnormal; Notable for the following:    Glucose, Bld 156 (*)    Creatinine, Ser 1.62 (*)    GFR calc non Af Amer 33 (*)    GFR calc Af Amer 38 (*)    All other components within normal limits  I-STAT CHEM 8, ED - Abnormal; Notable for the following:    Glucose, Bld 129 (*)    Calcium, Ion 1.12 (*)    All other components within normal limits  PROTIME-INR  APTT  DIFFERENTIAL  ETHANOL   URINE RAPID DRUG SCREEN, HOSP PERFORMED  URINALYSIS, ROUTINE W REFLEX MICROSCOPIC (NOT AT [facility])  I-STAT TROPOININ, ED    Imaging Review Ct Head Wo Contrast  01/01/2016  CLINICAL DATA:  Code stroke.  Right-sided weakness. EXAM: CT HEAD WITHOUT CONTRAST TECHNIQUE: Contiguous axial images were obtained from the base of the skull through the vertex without contrast. COMPARISON:  02/14/2013 FINDINGS: Stable punctate low-density in the left caudate head is suggestive for an old lacune. No evidence for acute hemorrhage, mass lesion, midline shift, hydrocephalus or large infarct. Mucosal disease in the right maxillary sinus. No calvarial fracture. IMPRESSION: No acute intracranial abnormality. Electronically Signed   By: Markus Daft M.D.   On: 01/01/2016 15:50   I have personally reviewed and evaluated these images and lab results as part of my medical decision-making.   EKG Interpretation   Date/Time:  Thursday January 01 2016 15:52:58 EST Ventricular Rate:  75 PR Interval:  138 QRS Duration: 138 QT Interval:  432 QTC Calculation: 482 R Axis:   36 Text Interpretation:  Sinus rhythm Right bundle branch block Confirmed by  Tahja Liao  MD, Lenola Lockner ([id number]) on 01/01/2016 5:25:00 PM      MDM   Final diagnoses:  Transient cerebral ischemia, unspecified transient cerebral ischemia type    Patient presents with right side facial  numbness. She has no other deficits. She was evaluated by neurology who recommends admission for observation. She is unable to obtain an MRI given her pacemaker. I spoke with the hospitalist to admit patient for observation to telemetry.    Malvin Johns, MD 01/01/16 [phone]

## 2016-01-01 NOTE — ED Notes (Signed)
Pt here c/o stroke like symptoms including right side tongue numbness and the right side of her face feels numb. Pt states she noted this between 1330 and 1430. Pt reports she was normal at 1330. Pt is alert and oriented with equal strength throughout her limbs.

## 2016-01-01 NOTE — ED Notes (Signed)
Pt arrived to ED via [place] with husband with slurred speech, right side facial numbness, right sided weakness. She reported symptoms started at 1pm and was with her husband. Code stroke okay'd by Dr. Tamera Punt.

## 2016-01-01 NOTE — Consult Note (Signed)
Requesting Physician: Dr. Tamera Punt    Chief Complaint: Multiple neurological complaints. "I think I am having a stroke"  HPI:                                                                                                                                         [name] is an 65 y.o. female with large PMHx including HTN, CVA, Pacemaker, and anxiety.  She was last felt normal at 0500 hours this AM.  She came to the ED due to her tongue feeling less sensation on the right than the left and som pain in the right posterior neck. She was made a code stroke in triage but after CT head it was noted the LSN was actually at 0500. On exam she is very anxious with jerky like limb movements and stuttering voice. CT head was non-focal. Of note, she endorsed weakness with focal subjective findings that varied in her history depending upon the examiner interviewing her. Complaints included right facial weakness, tongue weakness, and generalized weakness. Code stroke was called.   Date last known well: Today Time last known well: Time: 05:00 tPA Given: No: NIHSS 0 with no definite localizable findings. Risks of IV tPA significantly outweigh potential benefits.   Past Medical History  Diagnosis Date  . Ischemic cardiomyopathy     status post biventricular ICD placed by DR Edumunds who used to see Dr Melvern Banker here to establish  cardiovascular care.  . Hypertension   . Coronary artery disease   . CHF (congestive heart failure) ([place])   . MVP (mitral valve prolapse)     Antibiotics not required for procedures  . Asthma   . Anxiety   . Depression   . Hiatal hernia   . Cervical dysplasia   . Fibroid   . Function kidney decreased   . History of shingles     2-3 yrs ago last out break "around waist"  . ICD (implantable cardiac defibrillator) in place   . Pacemaker   . Complication of anesthesia     "I wake up during surgeries" (02/14/2013)  . Anginal pain ([place])   . Myocardial infarction ([place])     "I've  had 2; the others they were able to catch before completing" (02/14/2013)  . Pneumonia 1950's & 1985  . Type II diabetes mellitus ([place])   . Iron deficiency anemia   . GERD (gastroesophageal reflux disease)   . History of stomach ulcers   . Migraines   . Stroke [facility])     "2 confirmed; 9 TIA's; results in dragging LLE; numbness in tip of tongue" (02/14/2013),10-06-15 right hand tends to be weaker when tired.  . Shortness of breath     "lying down flat; at times w/exertion" (02/14/2013). 10-06-15 exertion only..  . Chronic kidney disease     Dr. Posey Pronto follows.  . Back pain     "pinched nerve-lower  back" - Dr. Nelva Bush follows.  . Hepatitis     Hepatitis A -college yrs"water source exposure"  . AICD (automatic cardioverter/defibrillator) present     [employer]- Dr. Beckie Salts follows    Past Surgical History  Procedure Laterality Date  . Insert / replace / remove pacemaker      biventricular defibrillator--06/10/ 2009  . Abdominal hysterectomy  1985    TAH   . Cardiac defibrillator placement    . Coronary angioplasty with stent placement      "started out w/5; bypass corrected some; 1 stent since the bypass" (02/14/2013)  . Breast excisional biopsy Left 01/2007; 06/2007; 03/2008    "benign" (02/14/2013)  . Cardiac catheterization      "probably in the teens" (02/14/2013)  . Colonoscopy  ~ 2002  . Tee without cardioversion  11/14/2012    Procedure: TRANSESOPHAGEAL ECHOCARDIOGRAM (TEE);  Surgeon: Candee Furbish, MD;  Location: [facility] ENDOSCOPY;  Service: Cardiovascular;  Laterality: N/A;  . Supraventricular tachycardia ablation  06/2007  . Biv icd genertaor change out  06/2007; 04/2008    "2 lead initial placement, at [facility]; done at [facility], after developing CHF" (02/14/2013)  . Coronary artery bypass graft  ` 1998    LIMA-LAD, SVG-D1, SVG-PDA  . Breast surgery    . Dilation and curettage of uterus  1975 X 2; 1976; 1977  . Left heart catheterization with coronary/graft angiogram N/A 01/03/2015    Procedure: LEFT HEART  CATHETERIZATION WITH Beatrix Fetters;  Surgeon: Sinclair Grooms, MD;  Location: [facility] CATH LAB;  Service: Cardiovascular;  Laterality: N/A;  . Biv pacemaker generator change out N/A 01/29/2015    Procedure: BIV PACEMAKER GENERATOR CHANGE OUT;  Surgeon: Evans Lance, MD;  Location: [facility] CATH LAB;  Service: Cardiovascular;  Laterality: N/A;  . Colonoscopy with propofol N/A 10/07/2015    Procedure: COLONOSCOPY WITH PROPOFOL;  Surgeon: Juanita Craver, MD;  Location: [facility] ENDOSCOPY;  Service: Endoscopy;  Laterality: N/A;    Family History  Problem Relation Age of Onset  . Heart disease Mother   . Hypertension Mother   . Heart attack Mother   . Stroke Mother   . Heart disease Father   . Hypertension Father   . Diabetes Father   . Kidney failure Father   . Heart attack Father   . Stroke Father   . Heart disease Brother   . Diabetes Brother   . Kidney failure Brother   . Heart attack Brother   . Diabetes Paternal Grandmother    Social History:  reports that she has never smoked. She has never used smokeless tobacco. She reports that she does not drink alcohol or use illicit drugs.  Allergies:  Allergies  Allergen Reactions  . Calcium Channel Blockers Other (See Comments)    Cannot take non-DHP CCBs (diltiazem, verapamil) due to CHF requiring ICD  . Codeine Anaphylaxis  . Ticlid [Ticlopidine Hcl] Other (See Comments)    Unprovoked bleeding while on Ticlid (doesn't think she was on ASA at the time) Takes Plavix without problems  . Morphine And Related Other (See Comments)    Severe AMS, confusion, weakness Tolerates Fentanyl, Tramadol  . Tape Dermatitis and Rash    Tolerates paper tape  . Digoxin And Related Nausea Only    Unknown  . Metolazone Other (See Comments)    Cannot remember reaction  . Myrbetriq [Mirabegron] Other (See Comments)    Aggravates migraines  . Onglyza [Saxagliptin] Other (See Comments)    Exacerbates migraines  . Penicillins  Hives    Has patient had a  PCN reaction causing immediate rash, facial/tongue/throat swelling, SOB or lightheadedness with hypotension: No Has patient had a PCN reaction causing severe rash involving mucus membranes or skin necrosis: No Has patient had a PCN reaction that required hospitalization No Has patient had a PCN reaction occurring within the last 10 years: No If all of the above answers are "NO", then may proceed with Cephalosporin use.   [name] Spironolactone Other (See Comments)    Cannot remember reaction  . Sulfa Antibiotics Hives  . Tetracyclines & Related Other (See Comments)    Cannot remember reaction Tolerates macrolides (azithromycin)  . Latex Itching and Rash    Medications:                                                                                                                           No current facility-administered medications for this encounter.   Current Outpatient Prescriptions  Medication Sig Dispense Refill  . albuterol (PROVENTIL HFA;VENTOLIN HFA) 108 (90 BASE) MCG/ACT inhaler Inhale 2 puffs into the lungs every 4 (four) hours as needed for wheezing or shortness of breath.    . ALPRAZolam (XANAX) 0.5 MG tablet Take 1-2 tablets by mouth 3 (three) times daily. 2 tabs in the AM, 1 at noon, 1 at night    . Artificial Tear GEL Apply 2 drops to eye daily as needed (dry eyes). \    . aspirin 325 MG EC tablet Take 325 mg by mouth daily.      [name] BIOTIN PO Take 10 mg by mouth daily. Biotin plus keratin    . cetirizine (ZYRTEC) 10 MG tablet Take 10 mg by mouth daily.    . Cholecalciferol (VITAMIN D-3) 1000 UNITS CAPS Take 1 capsule by mouth at bedtime.     . clopidogrel (PLAVIX) 75 MG tablet Take 1 tablet by mouth  daily 90 tablet 3  . doxazosin (CARDURA) 4 MG tablet Take 4 mg by mouth at bedtime.     . Estradiol 10 MCG TABS vaginal tablet Place 1 tablet (10 mcg total) vaginally 2 (two) times a week. (Patient taking differently: Place 1 tablet vaginally 2 (two) times a week. Sunday, Thursday)  8 tablet 11  . ezetimibe (ZETIA) 10 MG tablet Take 1 tablet by mouth  daily 90 tablet 3  . fluticasone (FLONASE) 50 MCG/ACT nasal spray Place 2 sprays into the nose daily as needed for allergies or rhinitis.     . furosemide (LASIX) 80 MG tablet Take 1 tablet by mouth two  times daily 180 tablet 3  . isosorbide mononitrate (IMDUR) 30 MG 24 hr tablet Take 1 tablet by mouth  daily 90 tablet 3  . Lancets (FREESTYLE) lancets     . LANTUS SOLOSTAR 100 UNIT/ML Solostar Pen Inject 18 Units into the skin at bedtime.    . lidocaine (LIDODERM) 5 % Place 1 patch onto the skin every 12 (twelve) hours as needed.  0  . metoprolol (LOPRESSOR) 100 MG tablet Take 1 tablet (100 mg total) by mouth 2 (two) times daily. 180 tablet 3  . Multiple Vitamin (MULTIVITAMIN WITH MINERALS) TABS Take 1 tablet by mouth daily.    [name] NITROSTAT 0.4 MG SL tablet Place 1 tablet under the tongue every 5 minutes as needed for chest pain, max 3 doses, go to er if no relief 25 tablet 2  . ondansetron (ZOFRAN ODT) 4 MG disintegrating tablet Take 1 tablet (4 mg total) by mouth every 8 (eight) hours as needed for nausea or vomiting. 12 tablet 0  . ondansetron (ZOFRAN) 8 MG tablet Take 8 mg by mouth every 8 (eight) hours as needed. For nausea    . pantoprazole (PROTONIX) 40 MG tablet Take 40 mg by mouth daily.    [name] PEG 3350-KCl-NaBcb-NaCl-NaSulf (PEG-3350/ELECTROLYTES) 236 G SOLR Take 0.5 Containers by mouth See admin instructions. Start first half at 6pm, second half at 2am  0  . potassium chloride (K-DUR) 10 MEQ tablet Take 2 tablets (20 mEq total) by mouth 2 (two) times daily. 360 tablet 1  . potassium chloride (K-DUR,KLOR-CON) 10 MEQ tablet Take 2 tablets by mouth two times daily 360 tablet 1  . ramipril (ALTACE) 10 MG capsule Take 1 capsule by mouth  twice daily 180 capsule 3  . simvastatin (ZOCOR) 20 MG tablet Take 1 tablet by mouth at  bedtime 90 tablet 2  . sodium chloride (MURO 128) 5 % ophthalmic ointment Place 1 drop into both  eyes at bedtime. For dry eyes    . traMADol (ULTRAM) 50 MG tablet Take 100 mg by mouth every 6 (six) hours as needed (MIGRAINES).    [name] venlafaxine (EFFEXOR) 100 MG tablet Take 100-200 mg by mouth See admin instructions. Take 2 tablets by mouth in the am 1 tablet by mouth in the pm     ROS:                                                                                                                                       History obtained from the patient. Acute assessment of ROS as per HPI.   Neurologic Examination:                                                                                                      There were no vitals taken for this visit.  HEENT-  Normocephalic, no lesions, without obvious abnormality.  Normal external eye and conjunctiva.  Extremities: Warm and well perfused.  Musculoskeletal-no joint tenderness, deformity or swelling Skin-warm and dry, no hyperpigmentation, vitiligo, or suspicious lesions  Neurological Examination Ment: Intact to complex questions and commands without dysarthria. Speech fluent but stuttering without evidence of aphasia.  Able to follow complex commands without difficulty. Somewhat pressured speech. Slight tangentiality.  CN: PERRL. Visual fields intact. EOMI without nystagmus. No definite facial droop. Intermittent asymmetric contraction of either side of face appears most likely to be embellished. Facial light touch sensation decreased on the left splitting midline to both LT and tuning fork. Hearing intact to conversation. No hypophonia or hoarseness. XI without asymmetry. Intermittent asymmetry of tongue protrusion to left and right also appears volitional.  Motor: 5/5 x 4. No definite focal weakness identified. Normal tone throughout; no atrophy noted Sensory: Intact to FT x 4.  Cerebellar: No ataxia noted.  Reflexes: Normoactive x 4. Toes downgoing.  Gait: Deferred.   Lab Results: Basic Metabolic Panel: No results for input(s):  NA, K, CL, CO2, GLUCOSE, BUN, CREATININE, CALCIUM, MG, PHOS in the last 168 hours.  Liver Function Tests: No results for input(s): AST, ALT, ALKPHOS, BILITOT, PROT, ALBUMIN in the last 168 hours. No results for input(s): LIPASE, AMYLASE in the last 168 hours. No results for input(s): AMMONIA in the last 168 hours.  CBC: No results for input(s): WBC, NEUTROABS, HGB, HCT, MCV, PLT in the last 168 hours.  Cardiac Enzymes: No results for input(s): CKTOTAL, CKMB, CKMBINDEX, TROPONINI in the last 168 hours.  Lipid Panel: No results for input(s): CHOL, TRIG, HDL, CHOLHDL, VLDL, LDLCALC in the last 168 hours.  CBG: No results for input(s): GLUCAP in the last 168 hours.  Microbiology: Results for orders placed or performed during the hospital encounter of 01/29/15  Surgical pcr screen     Status: None   Collection Time: 01/29/15  1:40 PM  Result Value Ref Range Status   MRSA, PCR NEGATIVE NEGATIVE Final   Staphylococcus aureus NEGATIVE NEGATIVE Final    Comment:        The Xpert SA Assay (FDA approved for NASAL specimens in patients over 67 years of age), is one component of a comprehensive surveillance program.  Test performance has been validated by [facility] for patients greater than or equal to 35 year old. It is not intended to diagnose infection nor to guide or monitor treatment.     Coagulation Studies: No results for input(s): LABPROT, INR in the last 72 hours.  Imaging: CT head reveals no acute abnormality. Small left caudate nucleus chronic lacunar infarction noted.   History and examination documented by Etta Quill PA-C, [facility], [phone]  01/01/2016, 3:50 PM   Assessment: 65 y.o. female  1. Multiple neurological complaints without definite localizing findings on exam. CT head reveals small left caudate nucleus chronic lacunar infarction.  2. Stroke Risk Factors - hyperlipidemia and hypertension  3. Ischemic cardiomyopathy with CHF 4. CAD  with history of MI 5. History of migraine headaches 6. Depression.  7. GERD  Recommendations: 1. Cannot obtain MRI brain due to AICD.  2. PT consult, OT consult, Speech consult 3. Continue ASA and Plavix.   4. Risk factor modification 5. Telemetry monitoring 6. May benefit from psychology consult to assess for possible life stressors.  7. Fasting lipid panel.  8. HgbA1c 9. Observe overnight.  10. Consider starting atorvastatin if there are no medical contraindications.   Kerney Elbe, MD

## 2016-01-01 NOTE — ED Notes (Signed)
Pt ambulated to restroom with steady gait.

## 2016-01-01 NOTE — ED Notes (Signed)
Activated code stroke 

## 2016-01-01 NOTE — ED Notes (Signed)
Pt transported to CT by RN.

## 2016-01-01 NOTE — Telephone Encounter (Signed)
Pt called said the right side of her tongue to medial part was numb and the rt side of her face feels "weird" and she is tired. Pt was advised to call EMS to go to ED. Pt understand and agreed.

## 2016-01-01 NOTE — H&P (Deleted)
[facility] History and Physical  [name] [id number] DOB: [birth year] DOA: 01/01/2016  Referring physician: Dr. Tamera Punt PCP: Reginia Naas, MD   Chief Complaint: R face/ tongue weakness  HPI: [name] is a 65 y.o. female with hx of MI/ CABG/ ICM, HTN, DM2, TIA / CVA in the past, systolic CHF, anxiety/ depression.  She presented to ED with R tongue then facial numbness.  Possbily some slurred speech. Seen by neurology and NIHSS score "0" per neurology, no TPA given.  They recommend overnight observation.  Patient says her symptoms are better but not gone.   In 2011 had TIA w numbness of tongue/ mouth, blurred vision, resolved. CT head/ neck negative. MRI not done d/t ICD.   In Jan 2014 had acute LLE/ LUE weakness which rapidly improved, did not get TPA. DC'd on plavix and ASA, TEE neg.  In Apr 2014 had L hand/ foot weakness.  No TPA d/t minmial findings and recent CVA.     Chart review: Jul 08 - chest pain, hx CABG, ICM, PSVT, DM2, HTN, anxiety/ depression > heart cath Nov 08 - a/c CHF, ICM EF 30%, CABG, hx ablation for SVT, AICD/ [employer],NIDDM May 09 - a/c CHF, ICM, SVT Jul 09 - severe ICM EF 15%, CHF NHYA class IV, severe MR, HTN, DM, HL, ICD Dec 2011 - TIA w numbness of tongue/ mouth and blurred vision; resolved by DC Jan 2014 - LUE/ LLE weakness/ dysartrhia > CT head neg, rapid improvement in hospital. No MRI d/t ICD. CTA neck/ brain neg for sig stenosis. DC'd on plavix and ASA. TEE neg.  Apr 2014 - CP and L hand/ foot numbness/ weakness.  Code stroke called, no TPA per neuro d/t minimal symptoms and recent CVA. No pradaxa given CKD.  No MRI d/t ICD.   Feb 2016 - chest pain, +trop.  Had cath which showed severe native 3VDz w patent bypass grafts.  Likely small vessel disease, medical Rx. EF 35%.    ROS  no CP  no abd pain  no SOB/ orthopnea  no leg swelling  no n/v/d  no fever  no chills  Where does patient live home Can patient participate  in ADLs? yes  Past Medical History  Past Medical History  Diagnosis Date  . Ischemic cardiomyopathy     status post biventricular ICD placed by DR Edumunds who used to see Dr Melvern Banker here to establish  cardiovascular care.  . Hypertension   . Coronary artery disease   . CHF (congestive heart failure) ([place])   . MVP (mitral valve prolapse)     Antibiotics not required for procedures  . Asthma   . Anxiety   . Depression   . Hiatal hernia   . Cervical dysplasia   . Fibroid   . Function kidney decreased   . History of shingles     2-3 yrs ago last out break "around waist"  . ICD (implantable cardiac defibrillator) in place   . Pacemaker   . Complication of anesthesia     "I wake up during surgeries" (02/14/2013)  . Anginal pain ([place])   . Myocardial infarction ([place])     "I've had 2; the others they were able to catch before completing" (02/14/2013)  . Pneumonia 1950's & 1985  . Type II diabetes mellitus ([place])   . Iron deficiency anemia   . GERD (gastroesophageal reflux disease)   . History of stomach ulcers   . Migraines   . Stroke [employer])     "  2 confirmed; 9 TIA's; results in dragging LLE; numbness in tip of tongue" (02/14/2013),10-06-15 right hand tends to be weaker when tired.  . Shortness of breath     "lying down flat; at times w/exertion" (02/14/2013). 10-06-15 exertion only..  . Chronic kidney disease     Dr. Posey Pronto follows.  . Back pain     "pinched nerve-lower back" - Dr. Nelva Bush follows.  . Hepatitis     Hepatitis A -college yrs"water source exposure"  . AICD (automatic cardioverter/defibrillator) present     [employer]- Dr. Beckie Salts follows   Past Surgical History  Past Surgical History  Procedure Laterality Date  . Insert / replace / remove pacemaker      biventricular defibrillator--06/10/ 2009  . Abdominal hysterectomy  1985    TAH   . Cardiac defibrillator placement    . Coronary angioplasty with stent placement      "started out w/5; bypass corrected some; 1 stent  since the bypass" (02/14/2013)  . Breast excisional biopsy Left 01/2007; 06/2007; 03/2008    "benign" (02/14/2013)  . Cardiac catheterization      "probably in the teens" (02/14/2013)  . Colonoscopy  ~ 2002  . Tee without cardioversion  11/14/2012    Procedure: TRANSESOPHAGEAL ECHOCARDIOGRAM (TEE);  Surgeon: Candee Furbish, MD;  Location: [facility] ENDOSCOPY;  Service: Cardiovascular;  Laterality: N/A;  . Supraventricular tachycardia ablation  06/2007  . Biv icd genertaor change out  06/2007; 04/2008    "2 lead initial placement, at [facility]; done at [facility], after developing CHF" (02/14/2013)  . Coronary artery bypass graft  ` 1998    LIMA-LAD, SVG-D1, SVG-PDA  . Breast surgery    . Dilation and curettage of uterus  1975 X 2; 1976; 1977  . Left heart catheterization with coronary/graft angiogram N/A 01/03/2015    Procedure: LEFT HEART CATHETERIZATION WITH Beatrix Fetters;  Surgeon: Sinclair Grooms, MD;  Location: [facility] CATH LAB;  Service: Cardiovascular;  Laterality: N/A;  . Biv pacemaker generator change out N/A 01/29/2015    Procedure: BIV PACEMAKER GENERATOR CHANGE OUT;  Surgeon: Evans Lance, MD;  Location: [facility] CATH LAB;  Service: Cardiovascular;  Laterality: N/A;  . Colonoscopy with propofol N/A 10/07/2015    Procedure: COLONOSCOPY WITH PROPOFOL;  Surgeon: Juanita Craver, MD;  Location: [facility] ENDOSCOPY;  Service: Endoscopy;  Laterality: N/A;   Family History  Family History  Problem Relation Age of Onset  . Heart disease Mother   . Hypertension Mother   . Heart attack Mother   . Stroke Mother   . Heart disease Father   . Hypertension Father   . Diabetes Father   . Kidney failure Father   . Heart attack Father   . Stroke Father   . Heart disease Brother   . Diabetes Brother   . Kidney failure Brother   . Heart attack Brother   . Diabetes Paternal Grandmother    Social History  reports that she has never smoked. She has never used smokeless tobacco. She reports that she does not drink alcohol or use  illicit drugs. Allergies  Allergies  Allergen Reactions  . Calcium Channel Blockers Other (See Comments)    Cannot take non-DHP CCBs (diltiazem, verapamil) due to CHF requiring ICD  . Codeine Anaphylaxis  . Ticlid [Ticlopidine Hcl] Other (See Comments)    Unprovoked bleeding while on Ticlid (doesn't think she was on ASA at the time) Takes Plavix without problems  . Morphine And Related Other (See Comments)    Severe  AMS, confusion, weakness Tolerates Fentanyl, Tramadol  . Tape Dermatitis and Rash    Tolerates paper tape  . Digoxin And Related Nausea Only    Unknown  . Metolazone Other (See Comments)    Cannot remember reaction  . Myrbetriq [Mirabegron] Other (See Comments)    Aggravates migraines  . Onglyza [Saxagliptin] Other (See Comments)    Exacerbates migraines  . Penicillins Hives    Has patient had a PCN reaction causing immediate rash, facial/tongue/throat swelling, SOB or lightheadedness with hypotension: No Has patient had a PCN reaction causing severe rash involving mucus membranes or skin necrosis: No Has patient had a PCN reaction that required hospitalization No Has patient had a PCN reaction occurring within the last 10 years: No If all of the above answers are "NO", then may proceed with Cephalosporin use.   [name] Spironolactone Other (See Comments)    Cannot remember reaction  . Sulfa Antibiotics Hives  . Tetracyclines & Related Other (See Comments)    Cannot remember reaction Tolerates macrolides (azithromycin)  . Latex Itching and Rash   Home medications Prior to Admission medications   Medication Sig Start Date End Date Taking? Authorizing Provider  albuterol (PROVENTIL HFA;VENTOLIN HFA) 108 (90 BASE) MCG/ACT inhaler Inhale 2 puffs into the lungs every 4 (four) hours as needed for wheezing or shortness of breath.   Yes Historical Provider, MD  ALPRAZolam Duanne Moron) 0.5 MG tablet Take 1-2 tablets by mouth 3 (three) times daily. 2 tabs in the AM, 1 at noon, 1 at  night 09/09/15  Yes Historical Provider, MD  Artificial Tear GEL Apply 2 drops to eye daily as needed (dry eyes). \   Yes Historical Provider, MD  aspirin 325 MG EC tablet Take 325 mg by mouth daily.     Yes Historical Provider, MD  BIOTIN PO Take 10 mg by mouth daily. Biotin plus keratin   Yes Historical Provider, MD  cetirizine (ZYRTEC) 10 MG tablet Take 10 mg by mouth daily.   Yes Historical Provider, MD  Cholecalciferol (VITAMIN D-3) 1000 UNITS CAPS Take 1 capsule by mouth daily.    Yes Historical Provider, MD  clopidogrel (PLAVIX) 75 MG tablet Take 1 tablet by mouth  daily 04/11/15  Yes Belva Crome, MD  doxazosin (CARDURA) 4 MG tablet Take 4 mg by mouth at bedtime.    Yes Historical Provider, MD  Estradiol 10 MCG TABS vaginal tablet Place 1 tablet (10 mcg total) vaginally 2 (two) times a week. Patient taking differently: Place 1 tablet vaginally 2 (two) times a week. Sunday, Thursday 06/05/15  Yes Terrance Mass, MD  ezetimibe (ZETIA) 10 MG tablet Take 1 tablet by mouth  daily 12/26/14  Yes Belva Crome, MD  fluticasone [facility]) 50 MCG/ACT nasal spray Place 2 sprays into the nose daily as needed for allergies or rhinitis.    Yes Historical Provider, MD  furosemide (LASIX) 80 MG tablet Take 1 tablet by mouth two  times daily 04/11/15  Yes Belva Crome, MD  isosorbide mononitrate (IMDUR) 30 MG 24 hr tablet Take 1 tablet by mouth  daily 04/11/15  Yes Belva Crome, MD  LANTUS SOLOSTAR 100 UNIT/ML Solostar Pen Inject 18 Units into the skin at bedtime. 09/09/15  Yes Historical Provider, MD  metoprolol (LOPRESSOR) 100 MG tablet Take 1 tablet (100 mg total) by mouth 2 (two) times daily. 12/26/14  Yes Belva Crome, MD  Multiple Vitamin (MULTIVITAMIN WITH MINERALS) TABS Take 1 tablet by mouth daily.   Yes  Historical Provider, MD  NITROSTAT 0.4 MG SL tablet Place 1 tablet under the tongue every 5 minutes as needed for chest pain, max 3 doses, go to er if no relief 11/18/14  Yes Belva Crome, MD   ondansetron (ZOFRAN ODT) 4 MG disintegrating tablet Take 1 tablet (4 mg total) by mouth every 8 (eight) hours as needed for nausea or vomiting. 12/18/15  Yes Everlene Balls, MD  ondansetron (ZOFRAN) 8 MG tablet Take 8 mg by mouth every 8 (eight) hours as needed. For nausea   Yes Historical Provider, MD  pantoprazole (PROTONIX) 40 MG tablet Take 40 mg by mouth daily.   Yes Historical Provider, MD  potassium chloride (K-DUR) 10 MEQ tablet Take 2 tablets (20 mEq total) by mouth 2 (two) times daily. 11/26/13  Yes Belva Crome, MD  ramipril (ALTACE) 10 MG capsule Take 1 capsule by mouth  twice daily 04/11/15  Yes Belva Crome, MD  simvastatin (ZOCOR) 20 MG tablet Take 1 tablet by mouth at  bedtime 10/06/15  Yes Belva Crome, MD  sodium chloride (MURO 128) 5 % ophthalmic ointment Place 1 drop into both eyes at bedtime. For dry eyes   Yes Historical Provider, MD  traMADol (ULTRAM) 50 MG tablet Take 100 mg by mouth every 6 (six) hours as needed (MIGRAINES).   Yes Historical Provider, MD  venlafaxine (EFFEXOR) 100 MG tablet Take 100-200 mg by mouth See admin instructions. Take 2 tablets by mouth in the am 1 tablet by mouth in the pm 04/12/11  Yes Historical Provider, MD   Liver Function Tests  Recent Labs Lab 01/01/16 1608  AST 30  ALT 26  ALKPHOS 73  BILITOT 0.5  PROT 6.9  ALBUMIN 3.7   No results for input(s): LIPASE, AMYLASE in the last 168 hours. CBC  Recent Labs Lab 01/01/16 1608 01/01/16 1621  WBC 6.5  --   NEUTROABS 3.6  --   HGB 11.7* 13.9  HCT 35.4* 41.0  MCV 86.3  --   PLT 234  --    Basic Metabolic Panel  Recent Labs Lab 01/01/16 1608 01/01/16 1621  NA 144 140  K 4.3 3.6  CL 105 103  CO2 28  --   GLUCOSE 156* 129*  BUN 6 12  CREATININE 1.62* 0.70  CALCIUM 9.3  --      Filed Vitals:   01/01/16 1556 01/01/16 1600 01/01/16 1745  BP: 149/98 158/105 134/77  Pulse: 71 72 62  Resp: 15 19 12   SpO2: 98% 96% 98%   Exam: Alert no distress No rash, cyanosis or  gangrene Sclera anicteric, throat clear No jvd or bruits Chest clear bilat RRR no MRG Abd obese soft ntnd no mass or ascites GU defer MS no joint effusion/ deform Neuro alert ox 3, pleasant Mild RLE weakness on SLR compared to R RUE/ LUE symmetric strength all groups Good sensation on face bilat, no facial droop CN's 2-12 intact including VF's  EKG (independently reviewed) > NSR , RBBB Head CT > no acute findings, old lacune L caudate  Home medications > albuterol, xanax, asa, plavix, cardura, estradiol, Zetia, flonase, lasix, Imdur, Lantus, Lopressor, MVI, SL NTG, zofran, protonix, KDur, Altace, Zocor, tramadol, Effexor  Na 144  K 4.3  Creat 1.62  Assessment: 1. R tongue and facial numbness - improved symptoms but not completely. Low NIHSS score ("0" per neurology") so patient did not receive TPA.  Neuro recommending overnight observation. Not MRI candidate. Hx TIA's/ CVA in the past  but w/o severe sequelae. 2. HTN 3. DM2 on insulin 4. CAD hx CABG 5. ICM EF 35% , compensated on lasix 6. Anxiety/ depression - stable  7. CKD 3 - creat 1.6 at baseline  Plan - admit telemetry, stroke team to follow.    DVT Prophylaxis lovenox CKD dose  Code Status: full  Family Communication: husband at bedside  Disposition Plan: home when better    Sol Blazing [facility] Pager [phone]  Cell [phone]  If 7PM-7AM, please contact night-coverage [email or web address] Password [facility] 01/01/2016, 6:28 PM

## 2016-01-02 ENCOUNTER — Observation Stay (HOSPITAL_COMMUNITY): Payer: Medicare Other

## 2016-01-02 ENCOUNTER — Observation Stay (HOSPITAL_BASED_OUTPATIENT_CLINIC_OR_DEPARTMENT_OTHER): Payer: Medicare Other

## 2016-01-02 DIAGNOSIS — G459 Transient cerebral ischemic attack, unspecified: Secondary | ICD-10-CM

## 2016-01-02 DIAGNOSIS — I10 Essential (primary) hypertension: Secondary | ICD-10-CM | POA: Diagnosis not present

## 2016-01-02 DIAGNOSIS — N189 Chronic kidney disease, unspecified: Secondary | ICD-10-CM | POA: Diagnosis not present

## 2016-01-02 DIAGNOSIS — E1122 Type 2 diabetes mellitus with diabetic chronic kidney disease: Secondary | ICD-10-CM | POA: Diagnosis not present

## 2016-01-02 LAB — GLUCOSE, CAPILLARY
GLUCOSE-CAPILLARY: 171 mg/dL — AB (ref 65–99)
Glucose-Capillary: 139 mg/dL — ABNORMAL HIGH (ref 65–99)
Glucose-Capillary: 139 mg/dL — ABNORMAL HIGH (ref 65–99)
Glucose-Capillary: 150 mg/dL — ABNORMAL HIGH (ref 65–99)

## 2016-01-02 LAB — CBG MONITORING, ED: Glucose-Capillary: 159 mg/dL — ABNORMAL HIGH (ref 65–99)

## 2016-01-02 LAB — LIPID PANEL
CHOL/HDL RATIO: 3.2 ratio
Cholesterol: 133 mg/dL (ref 0–200)
HDL: 41 mg/dL (ref >40)
LDL Cholesterol: 66 mg/dL (ref 0–99)
Triglycerides: 131 mg/dL (ref <150)
VLDL: 26 mg/dL (ref 0–40)

## 2016-01-02 LAB — ETHANOL: Alcohol, Ethyl (B): <5 mg/dL (ref <5)

## 2016-01-02 MED ORDER — FUROSEMIDE 80 MG PO TABS
80.0000 mg | ORAL_TABLET | Freq: Two times a day (BID) | ORAL | Status: DC
  Administered 2016-01-02 – 2016-01-04 (×5): 80 mg via ORAL
  Filled 2016-01-02 (×5): qty 1

## 2016-01-02 MED ORDER — ALPRAZOLAM 0.25 MG PO TABS
0.2500 mg | ORAL_TABLET | Freq: Once | ORAL | Status: AC
  Administered 2016-01-02: 0.25 mg via ORAL
  Filled 2016-01-02: qty 1

## 2016-01-02 MED ORDER — STROKE: EARLY STAGES OF RECOVERY BOOK
Freq: Once | Status: AC
  Administered 2016-01-02: 03:00:00
  Filled 2016-01-02: qty 1

## 2016-01-02 MED ORDER — PANTOPRAZOLE SODIUM 40 MG PO TBEC
40.0000 mg | DELAYED_RELEASE_TABLET | Freq: Every day | ORAL | Status: DC
  Administered 2016-01-02 – 2016-01-04 (×3): 40 mg via ORAL
  Filled 2016-01-02 (×3): qty 1

## 2016-01-02 MED ORDER — ONDANSETRON 4 MG PO TBDP: 4.0000 mg | ORAL_TABLET | Freq: Three times a day (TID) | ORAL | Status: DC | PRN

## 2016-01-02 MED ORDER — METOPROLOL TARTRATE 50 MG PO TABS
100.0000 mg | ORAL_TABLET | Freq: Two times a day (BID) | ORAL | Status: DC
  Administered 2016-01-02 – 2016-01-04 (×4): 100 mg via ORAL
  Filled 2016-01-02 (×6): qty 2

## 2016-01-02 MED ORDER — INSULIN ASPART 100 UNIT/ML ~~LOC~~ SOLN
0.0000 [IU] | Freq: Three times a day (TID) | SUBCUTANEOUS | Status: DC
  Administered 2016-01-02: 1 [IU] via SUBCUTANEOUS
  Administered 2016-01-02: 2 [IU] via SUBCUTANEOUS
  Administered 2016-01-02 – 2016-01-03 (×2): 1 [IU] via SUBCUTANEOUS

## 2016-01-02 MED ORDER — CLOPIDOGREL BISULFATE 75 MG PO TABS
75.0000 mg | ORAL_TABLET | Freq: Every day | ORAL | Status: DC
  Administered 2016-01-02 – 2016-01-04 (×3): 75 mg via ORAL
  Filled 2016-01-02 (×3): qty 1

## 2016-01-02 MED ORDER — DOXAZOSIN MESYLATE 8 MG PO TABS
4.0000 mg | ORAL_TABLET | Freq: Every day | ORAL | Status: DC
  Administered 2016-01-02 – 2016-01-03 (×3): 4 mg via ORAL
  Filled 2016-01-02 (×3): qty 1

## 2016-01-02 MED ORDER — ASPIRIN EC 325 MG PO TBEC
325.0000 mg | DELAYED_RELEASE_TABLET | Freq: Every day | ORAL | Status: DC
  Administered 2016-01-02 – 2016-01-04 (×3): 325 mg via ORAL
  Filled 2016-01-02 (×3): qty 1

## 2016-01-02 MED ORDER — VENLAFAXINE HCL 50 MG PO TABS
100.0000 mg | ORAL_TABLET | Freq: Every day | ORAL | Status: DC
  Administered 2016-01-02 – 2016-01-03 (×3): 100 mg via ORAL
  Filled 2016-01-02 (×3): qty 2

## 2016-01-02 MED ORDER — POTASSIUM CHLORIDE ER 10 MEQ PO TBCR
20.0000 meq | EXTENDED_RELEASE_TABLET | Freq: Two times a day (BID) | ORAL | Status: DC
  Administered 2016-01-02 – 2016-01-04 (×6): 20 meq via ORAL
  Filled 2016-01-02 (×12): qty 2

## 2016-01-02 MED ORDER — ISOSORBIDE MONONITRATE ER 30 MG PO TB24
30.0000 mg | ORAL_TABLET | Freq: Every day | ORAL | Status: DC
  Administered 2016-01-02 – 2016-01-04 (×3): 30 mg via ORAL
  Filled 2016-01-02 (×3): qty 1

## 2016-01-02 MED ORDER — VENLAFAXINE HCL 50 MG PO TABS
200.0000 mg | ORAL_TABLET | Freq: Every morning | ORAL | Status: DC
  Administered 2016-01-02 – 2016-01-04 (×3): 200 mg via ORAL
  Filled 2016-01-02 (×3): qty 1

## 2016-01-02 MED ORDER — SIMVASTATIN 20 MG PO TABS
20.0000 mg | ORAL_TABLET | Freq: Every day | ORAL | Status: DC
  Administered 2016-01-02 – 2016-01-03 (×2): 20 mg via ORAL
  Filled 2016-01-02 (×2): qty 1

## 2016-01-02 MED ORDER — RAMIPRIL 5 MG PO CAPS
10.0000 mg | ORAL_CAPSULE | Freq: Two times a day (BID) | ORAL | Status: DC
  Administered 2016-01-02 – 2016-01-04 (×6): 10 mg via ORAL
  Filled 2016-01-02 (×6): qty 2

## 2016-01-02 MED ORDER — TRAMADOL HCL 50 MG PO TABS
100.0000 mg | ORAL_TABLET | Freq: Four times a day (QID) | ORAL | Status: DC | PRN
  Administered 2016-01-02 – 2016-01-03 (×2): 100 mg via ORAL
  Filled 2016-01-02 (×2): qty 2

## 2016-01-02 MED ORDER — ALPRAZOLAM 0.5 MG PO TABS
0.5000 mg | ORAL_TABLET | Freq: Two times a day (BID) | ORAL | Status: DC
  Administered 2016-01-02 – 2016-01-04 (×5): 0.5 mg via ORAL
  Filled 2016-01-02 (×6): qty 1

## 2016-01-02 MED ORDER — ALPRAZOLAM 0.5 MG PO TABS
1.0000 mg | ORAL_TABLET | Freq: Every day | ORAL | Status: DC
  Administered 2016-01-02 – 2016-01-04 (×3): 1 mg via ORAL
  Filled 2016-01-02 (×3): qty 2

## 2016-01-02 MED ORDER — ENOXAPARIN SODIUM 30 MG/0.3ML ~~LOC~~ SOLN
30.0000 mg | Freq: Every day | SUBCUTANEOUS | Status: DC
  Administered 2016-01-02 – 2016-01-04 (×3): 30 mg via SUBCUTANEOUS
  Filled 2016-01-02 (×3): qty 0.3

## 2016-01-02 MED ORDER — ADULT MULTIVITAMIN W/MINERALS CH
1.0000 | ORAL_TABLET | Freq: Every day | ORAL | Status: DC
  Administered 2016-01-02 – 2016-01-04 (×3): 1 via ORAL
  Filled 2016-01-02 (×3): qty 1

## 2016-01-02 MED ORDER — SENNOSIDES-DOCUSATE SODIUM 8.6-50 MG PO TABS: 1.0000 | ORAL_TABLET | Freq: Every evening | ORAL | Status: DC | PRN

## 2016-01-02 MED ORDER — KETOROLAC TROMETHAMINE 30 MG/ML IJ SOLN
30.0000 mg | Freq: Once | INTRAMUSCULAR | Status: AC
  Administered 2016-01-02: 30 mg via INTRAVENOUS
  Filled 2016-01-02: qty 1

## 2016-01-02 MED ORDER — INSULIN GLARGINE 100 UNIT/ML ~~LOC~~ SOLN
18.0000 [IU] | Freq: Every day | SUBCUTANEOUS | Status: DC
  Administered 2016-01-02 – 2016-01-03 (×3): 18 [IU] via SUBCUTANEOUS
  Filled 2016-01-02 (×4): qty 0.18

## 2016-01-02 MED ORDER — INSULIN GLARGINE 100 UNIT/ML SOLOSTAR PEN: 18.0000 [IU] | PEN_INJECTOR | Freq: Every day | SUBCUTANEOUS | Status: DC

## 2016-01-02 MED ORDER — INSULIN ASPART 100 UNIT/ML ~~LOC~~ SOLN: 0.0000 [IU] | Freq: Every day | SUBCUTANEOUS | Status: DC

## 2016-01-02 MED ORDER — EZETIMIBE 10 MG PO TABS
10.0000 mg | ORAL_TABLET | Freq: Every day | ORAL | Status: DC
  Administered 2016-01-02 – 2016-01-04 (×3): 10 mg via ORAL
  Filled 2016-01-02 (×3): qty 1

## 2016-01-02 NOTE — Progress Notes (Signed)
OT Cancellation Note  Patient Details Name: [name] MRN: [id number] DOB: 1951/07/20   Cancelled Treatment:    Reason Eval/Treat Not Completed: Medical issues which prohibited therapy (pt with active bedrest orders). Will follow up for OT eval as time allows and pt is appropriate.    Binnie Kand M.S., OTR/L Pager: [phone]  01/02/2016, 2:41 PM

## 2016-01-02 NOTE — Progress Notes (Signed)
*  PRELIMINARY RESULTS* Vascular Ultrasound Carotid Duplex (Doppler) has been completed.  Preliminary findings: Bilateral: No significant (1-39%) ICA stenosis. Antegrade vertebral flow.    Landry Mellow, RDMS, RVT  01/02/2016, 6:19 PM

## 2016-01-02 NOTE — Evaluation (Signed)
Speech Language Pathology Evaluation Patient Details Name: [name] MRN: [id number] DOB: [birth year] Today's Date: 01/02/2016 Time: 1030-1040 SLP Time Calculation (min) (ACUTE ONLY): 10 min  Problem List:  Patient Active Problem List   Diagnosis Date Noted  . Facial numbness 01/01/2016  . Ischemic cardiomyopathy 01/28/2015  . Chest pain with moderate risk of acute coronary syndrome   . Mastodynia, female 05/14/2014  . Mass of breast, left 05/14/2014  . CVA (cerebral infarction) 11/11/2012  . HTN (hypertension) 11/11/2012  . DM (diabetes mellitus) ([place]) 11/11/2012  . Chronic renal insufficiency, stage III (moderate) 11/11/2012  . TIA (transient ischemic attack) 11/11/2012  . Hemiplegia, unspecified, affecting nondominant side 11/11/2012  . History of shingles   . MVP (mitral valve prolapse)   . Asthma   . Anxiety   . Depression   . Hiatal hernia   . Cervical dysplasia   . Fibroid   . Biventricular implantable cardioverter-defibrillator in situ 06/18/2011  . Paroxysmal SVT (supraventricular tachycardia) ([place]) 06/18/2011  . H/O CABG 1998, multiple caths, cath 01/03/15 showed patent grafts 06/18/2011   Past Medical History:  Past Medical History  Diagnosis Date  . Ischemic cardiomyopathy     status post biventricular ICD placed by DR Edumunds who used to see Dr Melvern Banker here to establish  cardiovascular care.  . Hypertension   . Coronary artery disease   . CHF (congestive heart failure) ([place])   . MVP (mitral valve prolapse)     Antibiotics not required for procedures  . Asthma   . Anxiety   . Depression   . Hiatal hernia   . Cervical dysplasia   . Fibroid   . Function kidney decreased   . History of shingles     2-3 yrs ago last out break "around waist"  . ICD (implantable cardiac defibrillator) in place   . Pacemaker   . Complication of anesthesia     "I wake up during surgeries" (02/14/2013)  . Anginal pain ([place])   . Myocardial infarction ([place])     "I've  had 2; the others they were able to catch before completing" (02/14/2013)  . Pneumonia 1950's & 1985  . Type II diabetes mellitus ([place])   . Iron deficiency anemia   . GERD (gastroesophageal reflux disease)   . History of stomach ulcers   . Migraines   . Stroke [facility])     "2 confirmed; 9 TIA's; results in dragging LLE; numbness in tip of tongue" (02/14/2013),10-06-15 right hand tends to be weaker when tired.  . Shortness of breath     "lying down flat; at times w/exertion" (02/14/2013). 10-06-15 exertion only..  . Chronic kidney disease     Dr. Posey Pronto follows.  . Back pain     "pinched nerve-lower back" - Dr. Nelva Bush follows.  . Hepatitis     Hepatitis A -college yrs"water source exposure"  . AICD (automatic cardioverter/defibrillator) present     [employer]- Dr. Beckie Salts follows   Past Surgical History:  Past Surgical History  Procedure Laterality Date  . Insert / replace / remove pacemaker      biventricular defibrillator--06/10/ 2009  . Abdominal hysterectomy  1985    TAH   . Cardiac defibrillator placement    . Coronary angioplasty with stent placement      "started out w/5; bypass corrected some; 1 stent since the bypass" (02/14/2013)  . Breast excisional biopsy Left 01/2007; 06/2007; 03/2008    "benign" (02/14/2013)  . Cardiac catheterization      "probably  in the teens" (02/14/2013)  . Colonoscopy  ~ 2002  . Tee without cardioversion  11/14/2012    Procedure: TRANSESOPHAGEAL ECHOCARDIOGRAM (TEE);  Surgeon: Candee Furbish, MD;  Location: [facility] ENDOSCOPY;  Service: Cardiovascular;  Laterality: N/A;  . Supraventricular tachycardia ablation  06/2007  . Biv icd genertaor change out  06/2007; 04/2008    "2 lead initial placement, at [facility] done at [facility], after developing CHF" (02/14/2013)  . Coronary artery bypass graft  ` 1998    LIMA-LAD, SVG-D1, SVG-PDA  . Breast surgery    . Dilation and curettage of uterus  1975 X 2; 1976; 1977  . Left heart catheterization with coronary/graft angiogram N/A 01/03/2015     Procedure: LEFT HEART CATHETERIZATION WITH Beatrix Fetters;  Surgeon: Sinclair Grooms, MD;  Location: [facility] CATH LAB;  Service: Cardiovascular;  Laterality: N/A;  . Biv pacemaker generator change out N/A 01/29/2015    Procedure: BIV PACEMAKER GENERATOR CHANGE OUT;  Surgeon: Evans Lance, MD;  Location[facility] CATH LAB;  Service: Cardiovascular;  Laterality: N/A;  . Colonoscopy with propofol N/A 10/07/2015    Procedure: COLONOSCOPY WITH PROPOFOL;  Surgeon: Juanita Craver, MD;  Location: [facility] ENDOSCOPY;  Service: Endoscopy;  Laterality: N/A;   HPI:  Patient is a 65 y.o. female with h/o: MI/ CABG/ ICM, HTN, DM2, TIA / CVA in the past, systolic CHF, anxiety/ depression. She prestented to ED on 2/23 with right tongue and right facial numbness and weakness.   Assessment / Plan / Recommendation Clinical Impression  Patient presents as [facility] for cognitive-linguistic and speech functioning. Patient did report that she still had some numbness on back, right side of tongue, but that it has improved and also stated that she can barely feel the right side facial numbness anymore. Patient did not exhibit any reduction in intelligibility of speech and speech quality and articulation accuracy at conversational level appeared WFL-WNL. Patient denied any current speech-language or cognitive impairments.    SLP Assessment  Patient does not need any further Speech Lanaguage Pathology Services    Follow Up Recommendations  None    Frequency and Duration     N/A      SLP Evaluation Prior Functioning  Cognitive/Linguistic Baseline: Within functional limits Type of Home: House Available Help at Discharge: Family   Cognition  Overall Cognitive Status: Within Functional Limits for tasks assessed Orientation Level: Oriented X4    Comprehension  Auditory Comprehension Overall Auditory Comprehension: Appears within functional limits for tasks assessed    Expression Expression Primary Mode of Expression:  Verbal Verbal Expression Overall Verbal Expression: Appears within functional limits for tasks assessed Written Expression Written Expression: Not tested   Oral / Motor  Oral Motor/Sensory Function Overall Oral Motor/Sensory Function: Mild impairment Facial ROM: Within Functional Limits Facial Symmetry: Within Functional Limits Facial Strength: Within Functional Limits Facial Sensation: Reduced right Lingual ROM: Within Functional Limits Lingual Symmetry: Within Functional Limits Lingual Strength: Within Functional Limits Lingual Sensation: Reduced (patient reported numbness on right side of tongue, towards the back) Velum: Within Functional Limits Mandible: Within Functional Limits Motor Speech Overall Motor Speech: Appears within functional limits for tasks assessed   GO          Functional Assessment Tool Used: clinical judgement Functional Limitations: Spoken language expressive Motor Speech Current Status [phone]): 0 percent impaired, limited or restricted Motor Speech Goal Status [id number]): 0 percent impaired, limited or restricted Motor Speech Goal Status [id number]): 0 percent impaired, limited or restricted Spoken Language Comprehension Current Status [phone]):  0 percent impaired, limited or restricted Spoken Language Comprehension Goal Status [phone]): 0 percent impaired, limited or restricted Spoken Language Comprehension Discharge Status [phone]): 0 percent impaired, limited or restricted Spoken Language Expression Current Status [phone]): 0 percent impaired, limited or restricted Spoken Language Expression Goal Status [id number]): 0 percent impaired, limited or restricted Spoken Language Expression Discharge Status [phone]): 0 percent impaired, limited or restricted         Dannial Monarch 01/02/2016, 1:02 PM   Sonia Baller, [place], [place] 01/02/2016 1:02 PM Phone: [phone] Fax: [phone]

## 2016-01-02 NOTE — Care Management Note (Signed)
Case Management Note  Patient Details  Name: COREA NEYRA MRN: [id number] Date of Birth: 1950/12/06  Subjective/Objective:   Patient admitted with TIA. Patient is from home with spouse.                  Action/Plan: Awaiting PT/OT recommendations. CM following for discharge needs.   Expected Discharge Date:  01/04/16               Expected Discharge Plan:  Home/Self Care  In-House Referral:     Discharge planning Services     Post Acute Care Choice:    Choice offered to:     DME Arranged:    DME Agency:     HH Arranged:    HH Agency:     Status of Service:  In process, will continue to follow  Medicare Important Message Given:    Date Medicare IM Given:    Medicare IM give by:    Date Additional Medicare IM Given:    Additional Medicare Important Message give by:     If discussed at [place] of Stay Meetings, dates discussed:    Additional Comments:  Pollie Friar, RN 01/02/2016, 10:51 AM

## 2016-01-02 NOTE — Evaluation (Signed)
Physical Therapy Evaluation Patient Details Name: [name] MRN: [id number] DOB: 25-Jan-1951 Today's Date: 01/02/2016   History of Present Illness  65 yo female with onset of R facial weakness was admitted, PMHx:  MI, CABG, DM, HTN, TIA and CVA  Clinical Impression  Pt is not demonstrating any concerning gait or transfer issues, very much able to walk and negotiate steps.  Her O2 sats were 96% to 97% with gait, did not see an issue that merited further therapy.  Pt has a plan to go home with family 24/7 and for now will be able to walk in the hospital with the nurses to monitor her for further needs.    Follow Up Recommendations No PT follow up    Equipment Recommendations       Recommendations for Other Services       Precautions / Restrictions Precautions Precautions: Fall;ICD/Pacemaker Restrictions Weight Bearing Restrictions: No      Mobility  Bed Mobility Overal bed mobility: Modified Independent                Transfers Overall transfer level: Modified independent Equipment used: None             General transfer comment: reminders for hand placement  Ambulation/Gait Ambulation/Gait assistance: Supervision Ambulation Distance (Feet): 275 Feet Assistive device: None Gait Pattern/deviations: Step-through pattern;Narrow base of support;Decreased stride length Gait velocity: reduced Gait velocity interpretation: Below normal speed for age/gender    Stairs Stairs: Yes Stairs assistance: Supervision Stair Management: One rail Left;Forwards;Alternating pattern Number of Stairs: 13 General stair comments: controlled pace and safe technique  Wheelchair Mobility    Modified Rankin (Stroke Patients Only) Modified Rankin (Stroke Patients Only) Pre-Morbid Rankin Score: No significant disability Modified Rankin: No significant disability     Balance Overall balance assessment: Modified Independent                                            Pertinent Vitals/Pain Pain Assessment: No/denies pain    Home Living Family/patient expects to be discharged to:: Private residence Living Arrangements: Children;Spouse/significant other Available Help at Discharge: Family Type of Home: House Home Access: Stairs to enter Entrance Stairs-Rails: Left Entrance Stairs-Number of Steps: 8 Home Layout: One level Home Equipment: None      Prior Function Level of Independence: Independent               Hand Dominance        Extremity/Trunk Assessment   Upper Extremity Assessment: Overall WFL for tasks assessed           Lower Extremity Assessment: Overall WFL for tasks assessed      Cervical / Trunk Assessment: Normal  Communication   Communication: No difficulties  Cognition Arousal/Alertness: Awake/alert Behavior During Therapy: WFL for tasks assessed/performed Overall Cognitive Status: Within Functional Limits for tasks assessed                      General Comments General comments (skin integrity, edema, etc.): Pt was seen for evaluation of gait and to determine needs for rehab after dc.  Her plan is to get help with possibly her daughter going home but do not see any active therapy needs for now.    Exercises        Assessment/Plan    PT Assessment Patent does not need any further PT services  PT Diagnosis Difficulty walking   PT Problem List    PT Treatment Interventions     PT Goals (Current goals can be found in the Care Plan section) Acute Rehab PT Goals Patient Stated Goal: to walk and feel better    Frequency     Barriers to discharge        Co-evaluation               End of Session Equipment Utilized During Treatment: Gait belt Activity Tolerance: Patient tolerated treatment well Patient left: in bed;with call bell/phone within reach;with nursing/sitter in room;with family/visitor present Nurse Communication: Mobility status    Functional  Assessment Tool Used: clinical judgement Functional Limitation: Mobility: Walking and moving around Mobility: Walking and Moving Around Current Status [id number]): At least 1 percent but less than 20 percent impaired, limited or restricted Mobility: Walking and Moving Around Goal Status [phone]): At least 1 percent but less than 20 percent impaired, limited or restricted Mobility: Walking and Moving Around Discharge Status [phone]): At least 1 percent but less than 20 percent impaired, limited or restricted    Time: 1548-1620 PT Time Calculation (min) (ACUTE ONLY): 32 min   Charges:   PT Evaluation $PT Eval Low Complexity: 1 Procedure PT Treatments $Gait Training: 8-22 mins   PT G Codes:   PT G-Codes **NOT FOR INPATIENT CLASS** Functional Assessment Tool Used: clinical judgement Functional Limitation: Mobility: Walking and moving around Mobility: Walking and Moving Around Current Status [id number]): At least 1 percent but less than 20 percent impaired, limited or restricted Mobility: Walking and Moving Around Goal Status [phone]): At least 1 percent but less than 20 percent impaired, limited or restricted Mobility: Walking and Moving Around Discharge Status [phone]): At least 1 percent but less than 20 percent impaired, limited or restricted    Ramond Dial 01/02/2016, 5:41 PM    Mee Hives, PT MS Acute Rehab Dept. Number: [facility] [id number] and [place] [phone]

## 2016-01-02 NOTE — Progress Notes (Signed)
  Echocardiogram 2D Echocardiogram has been performed.  Donata Clay 01/02/2016, 4:52 PM

## 2016-01-02 NOTE — Progress Notes (Signed)
[facility] PROGRESS NOTE  [name] [id number] DOB: 31-Aug-1951 DOA: 01/01/2016 PCP: Reginia Naas, MD  Assessment/Plan: 1. Right tongue and facial numbness- patient continues to have right tongue numbness, CT head is negative for stroke. MRI could not be obtained as patient has ICD in place. Continue aspirin and Plavix. Neurology following 2. Hypertension-blood pressure controlled, continue metoprolol 3. Diabetes mellitus- continue Lantus and sliding scale insulin 4. CAD status post CABG- stable 5. Ischemic cardiomyopathy-status post ICD, patient currently of high-dose Lasix. Stable 6. C KD stage III- creatinine stable, today creatinine is 0.70  Code Status: Full code Family Communication: No family at bedside Disposition Plan: Pending workup for his stroke   Consultants:  Neurology  Procedures:  Carotid Dopplers  Echocardiogram  Antibiotics:    HPI/Subjective: 65 y.o. female with hx of MI/ CABG/ ICM, HTN, DM2, TIA / CVA in the past, systolic CHF, anxiety/ depression. She presented to ED with R tongue then facial numbness. Possbily some slurred speech. Seen by neurology and NIHSS score "0" per neurology, no TPA given. They recommend overnight observation. Patient says her symptoms are better but not gone.  In 2011 had TIA w numbness of tongue/ mouth, blurred vision, resolved. CT head/ neck negative. MRI not done d/t ICD.  In Jan 2014 had acute LLE/ LUE weakness which rapidly improved, did not get TPA. DC'd on plavix and ASA, TEE neg.  In Apr 2014 had L hand/ foot weakness. No TPA d/t minmial findings and recent CVA.  Patient still has right facial numbness and numbness of right half of tongue  Objective: Filed Vitals:   01/02/16 0929 01/02/16 1351  BP: 151/85 124/73  Pulse: 88 73  Temp: 98.2 F (36.8 C) 98.2 F (36.8 C)  Resp: 18 18   No intake or output data in the 24 hours ending 01/02/16 1654 Filed Weights   01/02/16 0114   Weight: 77.3 kg (170 lb 6.7 oz)    Exam:   General:  Appears in no acute distress  Cardiovascular: S1S2 regular  Respiratory: Clear bilaterally  Abdomen: Soft, nontender, no organomegaly  Musculoskeletal: No cyanosis/clubbing/edema of lower extremities  Data Reviewed: Basic Metabolic Panel:  Recent Labs Lab 01/01/16 1608 01/01/16 1621  NA 144 140  K 4.3 3.6  CL 105 103  CO2 28  --   GLUCOSE 156* 129*  BUN 6 12  CREATININE 1.62* 0.70  CALCIUM 9.3  --    Liver Function Tests:  Recent Labs Lab 01/01/16 1608  AST 30  ALT 26  ALKPHOS 73  BILITOT 0.5  PROT 6.9  ALBUMIN 3.7   CBC:  Recent Labs Lab 01/01/16 1608 01/01/16 1621  WBC 6.5  --   NEUTROABS 3.6  --   HGB 11.7* 13.9  HCT 35.4* 41.0  MCV 86.3  --   PLT 234  --    Cardiac Enzymes: No results for input(s): CKTOTAL, CKMB, CKMBINDEX, TROPONINI in the last 168 hours. BNP (last 3 results)  Recent Labs  01/02/15 1717  BNP 61.5    ProBNP (last 3 results)  Recent Labs  07/25/15 1435  PROBNP 104.0*    CBG:  Recent Labs Lab 01/02/16 0050 01/02/16 1011 01/02/16 1126 01/02/16 1615  GLUCAP 159* 171* 139* 139*      Studies: Ct Head Wo Contrast  01/01/2016  CLINICAL DATA:  Code stroke.  Right-sided weakness. EXAM: CT HEAD WITHOUT CONTRAST TECHNIQUE: Contiguous axial images were obtained from the base of the skull through the vertex without contrast. COMPARISON:  02/14/2013 FINDINGS: Stable punctate low-density in the left caudate head is suggestive for an old lacune. No evidence for acute hemorrhage, mass lesion, midline shift, hydrocephalus or large infarct. Mucosal disease in the right maxillary sinus. No calvarial fracture. IMPRESSION: No acute intracranial abnormality. Electronically Signed   By: Markus Daft M.D.   On: 01/01/2016 15:50    Scheduled Meds: . ALPRAZolam  0.5 mg Oral BID AC  . ALPRAZolam  1 mg Oral Q breakfast  . aspirin  325 mg Oral Daily  . clopidogrel  75 mg Oral  Daily  . doxazosin  4 mg Oral QHS  . enoxaparin (LOVENOX) injection  30 mg Subcutaneous Daily  . ezetimibe  10 mg Oral Daily  . furosemide  80 mg Oral BID  . insulin aspart  0-5 Units Subcutaneous QHS  . insulin aspart  0-9 Units Subcutaneous TID WC  . insulin glargine  18 Units Subcutaneous QHS  . isosorbide mononitrate  30 mg Oral Daily  . metoprolol  100 mg Oral BID  . multivitamin with minerals  1 tablet Oral Daily  . pantoprazole  40 mg Oral Daily  . potassium chloride  20 mEq Oral BID  . ramipril  10 mg Oral BID  . simvastatin  20 mg Oral q1800  . venlafaxine  100 mg Oral QHS  . venlafaxine  200 mg Oral q morning - 10a   Continuous Infusions:   Principal Problem:   TIA (transient ischemic attack) Active Problems:   Biventricular implantable cardioverter-defibrillator in situ   H/O CABG 1998, multiple caths, cath 01/03/15 showed patent grafts   HTN (hypertension)   DM (diabetes mellitus) ([place])   Chronic renal insufficiency, stage III (moderate)   Ischemic cardiomyopathy   Facial numbness    Time spent: *25 min    [place] [facility] Pager [phone]. If 7PM-7AM, please contact night-coverage at [email or web address], password [facility] 01/02/2016, 4:54 PM

## 2016-01-02 NOTE — Progress Notes (Signed)
PT Cancellation Note  Patient Details Name: [name] MRN: [id number] DOB: 1951/04/30   Cancelled Treatment:    Reason Eval/Treat Not Completed: Medical issues which prohibited therapy (Bedrest).  Will check later to see if pt is permitted to work   Ramond Dial 01/02/2016, 9:52 AM   Mee Hives, PT MS Acute Rehab Dept. Number: [facility] [id number] and [place] [phone]

## 2016-01-03 DIAGNOSIS — G459 Transient cerebral ischemic attack, unspecified: Secondary | ICD-10-CM | POA: Diagnosis not present

## 2016-01-03 DIAGNOSIS — G4459 Other complicated headache syndrome: Secondary | ICD-10-CM | POA: Diagnosis not present

## 2016-01-03 DIAGNOSIS — I255 Ischemic cardiomyopathy: Secondary | ICD-10-CM | POA: Diagnosis not present

## 2016-01-03 DIAGNOSIS — E1122 Type 2 diabetes mellitus with diabetic chronic kidney disease: Secondary | ICD-10-CM | POA: Diagnosis not present

## 2016-01-03 DIAGNOSIS — R208 Other disturbances of skin sensation: Secondary | ICD-10-CM | POA: Diagnosis not present

## 2016-01-03 DIAGNOSIS — I25810 Atherosclerosis of coronary artery bypass graft(s) without angina pectoris: Secondary | ICD-10-CM | POA: Diagnosis not present

## 2016-01-03 LAB — GLUCOSE, CAPILLARY
GLUCOSE-CAPILLARY: 102 mg/dL — AB (ref 65–99)
Glucose-Capillary: 126 mg/dL — ABNORMAL HIGH (ref 65–99)
Glucose-Capillary: 143 mg/dL — ABNORMAL HIGH (ref 65–99)
Glucose-Capillary: 143 mg/dL — ABNORMAL HIGH (ref 65–99)

## 2016-01-03 LAB — HEMOGLOBIN A1C
HEMOGLOBIN A1C: 6.9 % — AB (ref 4.8–5.6)
MEAN PLASMA GLUCOSE: 151 mg/dL

## 2016-01-03 MED ORDER — CYCLOBENZAPRINE HCL 10 MG PO TABS
10.0000 mg | ORAL_TABLET | Freq: Three times a day (TID) | ORAL | Status: DC
  Administered 2016-01-03: 10 mg via ORAL
  Filled 2016-01-03 (×2): qty 1

## 2016-01-03 MED ORDER — CYCLOBENZAPRINE HCL 10 MG PO TABS
10.0000 mg | ORAL_TABLET | Freq: Three times a day (TID) | ORAL | Status: DC | PRN
  Administered 2016-01-03: 10 mg via ORAL
  Filled 2016-01-03: qty 1

## 2016-01-03 NOTE — Progress Notes (Signed)
[facility] PROGRESS NOTE  [name] [id number] DOB: 1951-08-28 DOA: 01/01/2016 PCP: Reginia Naas, MD  Assessment/Plan: 1. Right tongue and facial numbness- patient continues to have right tongue numbness, CT head is negative for stroke. MRI could not be obtained as patient has ICD in place. Continue aspirin and Plavix. Neurology following. Echo  done, results are pending at this time. 2. Headache/migraine- headache improved after she received Toradol yesterday. Today complains of muscle spasm, will start Flexeril 10 mg by mouth 3 times a day 3. Hypertension-blood pressure controlled, continue metoprolol 4. Diabetes mellitus- continue Lantus and sliding scale insulin 5. CAD status post CABG- stable 6. Ischemic cardiomyopathy-status post ICD, patient currently of high-dose Lasix. Stable 7. C KD stage III- creatinine stable, today creatinine is 0.70  Code Status: Full code Family Communication: No family at bedside Disposition Plan: Pending workup for his stroke   Consultants:  Neurology  Procedures:  Carotid Dopplers  Echocardiogram  Antibiotics:    HPI/Subjective: 65 y.o. female with hx of MI/ CABG/ ICM, HTN, DM2, TIA / CVA in the past, systolic CHF, anxiety/ depression. She presented to ED with R tongue then facial numbness. Possbily some slurred speech. Seen by neurology and NIHSS score "0" per neurology, no TPA given. They recommend overnight observation. Patient says her symptoms are better but not gone.  In 2011 had TIA w numbness of tongue/ mouth, blurred vision, resolved. CT head/ neck negative. MRI not done d/t ICD.  In Jan 2014 had acute LLE/ LUE weakness which rapidly improved, did not get TPA. DC'd on plavix and ASA, TEE neg.  In Apr 2014 had L hand/ foot weakness. No TPA d/t minmial findings and recent CVA.  Patient complains of headache with right neck muscle spasm.  Objective: Filed Vitals:   01/03/16 0947 01/03/16 1410  BP:  139/71 114/68  Pulse: 71 66  Temp: 97.9 F (36.6 C) 98.4 F (36.9 C)  Resp: 16 16    Intake/Output Summary (Last 24 hours) at 01/03/16 1651 Last data filed at 01/03/16 1410  Gross per 24 hour  Intake    480 ml  Output      0 ml  Net    480 ml   Filed Weights   01/02/16 0114  Weight: 77.3 kg (170 lb 6.7 oz)    Exam:   General:  Appears in no acute distress  Cardiovascular: S1S2 regular  Respiratory: Clear bilaterally  Abdomen: Soft, nontender, no organomegaly  Musculoskeletal: No cyanosis/clubbing/edema of lower extremities  Data Reviewed: Basic Metabolic Panel:  Recent Labs Lab 01/01/16 1608 01/01/16 1621  NA 144 140  K 4.3 3.6  CL 105 103  CO2 28  --   GLUCOSE 156* 129*  BUN 6 12  CREATININE 1.62* 0.70  CALCIUM 9.3  --    Liver Function Tests:  Recent Labs Lab 01/01/16 1608  AST 30  ALT 26  ALKPHOS 73  BILITOT 0.5  PROT 6.9  ALBUMIN 3.7   CBC:  Recent Labs Lab 01/01/16 1608 01/01/16 1621  WBC 6.5  --   NEUTROABS 3.6  --   HGB 11.7* 13.9  HCT 35.4* 41.0  MCV 86.3  --   PLT 234  --    Cardiac Enzymes: No results for input(s): CKTOTAL, CKMB, CKMBINDEX, TROPONINI in the last 168 hours. BNP (last 3 results) No results for input(s): BNP in the last 8760 hours.  ProBNP (last 3 results)  Recent Labs  07/25/15 1435  PROBNP 104.0*    CBG:  Recent Labs Lab 01/02/16 1615 01/02/16 2133 01/03/16 0645 01/03/16 1120 01/03/16 1630  GLUCAP 139* 150* 102* 126* 143*      Studies: No results found.  Scheduled Meds: . ALPRAZolam  0.5 mg Oral BID AC  . ALPRAZolam  1 mg Oral Q breakfast  . aspirin  325 mg Oral Daily  . clopidogrel  75 mg Oral Daily  . cyclobenzaprine  10 mg Oral TID  . doxazosin  4 mg Oral QHS  . enoxaparin (LOVENOX) injection  30 mg Subcutaneous Daily  . ezetimibe  10 mg Oral Daily  . furosemide  80 mg Oral BID  . insulin aspart  0-5 Units Subcutaneous QHS  . insulin aspart  0-9 Units Subcutaneous TID WC  .  insulin glargine  18 Units Subcutaneous QHS  . isosorbide mononitrate  30 mg Oral Daily  . metoprolol  100 mg Oral BID  . multivitamin with minerals  1 tablet Oral Daily  . pantoprazole  40 mg Oral Daily  . potassium chloride  20 mEq Oral BID  . ramipril  10 mg Oral BID  . simvastatin  20 mg Oral q1800  . venlafaxine  100 mg Oral QHS  . venlafaxine  200 mg Oral q morning - 10a   Continuous Infusions:   Principal Problem:   TIA (transient ischemic attack) Active Problems:   Biventricular implantable cardioverter-defibrillator in situ   H/O CABG 1998, multiple caths, cath 01/03/15 showed patent grafts   HTN (hypertension)   DM (diabetes mellitus) ([place])   Chronic renal insufficiency, stage III (moderate)   Ischemic cardiomyopathy   Facial numbness    Time spent: *25 min    [place] [facility] Pager [phone]. If 7PM-7AM, please contact night-coverage at [email or web address], password [facility] 01/03/2016, 4:51 PM

## 2016-01-03 NOTE — Progress Notes (Signed)
STROKE TEAM PROGRESS NOTE   HISTORY OF PRESENT ILLNESS [name] is a 65 y.o. female with large PMHx including HTN, CVA, Pacemaker, and anxiety. She was last felt normal at 0500 hours this AM. She came to the ED due to her tongue feeling less sensation on the right than the left and som pain in the right posterior neck. She was made a code stroke in triage but after CT head it was noted the LSN was actually at 0500. On exam she is very anxious with jerky like limb movements and stuttering voice. CT head was non-focal. Of note, she endorsed weakness with focal subjective findings that varied in her history depending upon the examiner interviewing her. Complaints included right facial weakness, tongue weakness, and generalized weakness. Code stroke was called.   Date last known well: Today Time last known well: Time: 05:00 tPA Given: No: NIHSS 0 with no definite localizable findings. Risks of IV tPA significantly outweigh potential benefits.    SUBJECTIVE (INTERVAL HISTORY) Her family was not at the bedside.  Overall she feels her condition is improved.  She now only has numbness at the very tip of her tongue.  She also reports only mild HA   OBJECTIVE Temp:  [97.2 F (36.2 C)-98.2 F (36.8 C)] 97.9 F (36.6 C) (02/25 0629) Pulse Rate:  [64-88] 64 (02/25 0629) Cardiac Rhythm:  [-] Normal sinus rhythm;Ventricular paced (02/24 2009) Resp:  [16-18] 16 (02/25 0629) BP: (120-151)/(72-85) 137/72 mmHg (02/25 0629) SpO2:  [95 %-99 %] 99 % (02/25 0629)  CBC:  Recent Labs Lab 01/01/16 1608 01/01/16 1621  WBC 6.5  --   NEUTROABS 3.6  --   HGB 11.7* 13.9  HCT 35.4* 41.0  MCV 86.3  --   PLT 234  --     Basic Metabolic Panel:  Recent Labs Lab 01/01/16 1608 01/01/16 1621  NA 144 140  K 4.3 3.6  CL 105 103  CO2 28  --   GLUCOSE 156* 129*  BUN 6 12  CREATININE 1.62* 0.70  CALCIUM 9.3  --     Lipid Panel:    Component Value Date/Time   CHOL 133 01/02/2016 0730   TRIG  131 01/02/2016 0730   HDL 41 01/02/2016 0730   CHOLHDL 3.2 01/02/2016 0730   VLDL 26 01/02/2016 0730   LDLCALC 66 01/02/2016 0730   HgbA1c:  Lab Results  Component Value Date   HGBA1C 6.9* 01/02/2016   Urine Drug Screen:    Component Value Date/Time   LABOPIA NONE DETECTED 01/01/2016 1905   COCAINSCRNUR NONE DETECTED 01/01/2016 1905   LABBENZ POSITIVE* 01/01/2016 1905   AMPHETMU NONE DETECTED 01/01/2016 1905   THCU NONE DETECTED 01/01/2016 1905   LABBARB NONE DETECTED 01/01/2016 1905      IMAGING  Ct Head Wo Contrast 01/01/2016   No acute intracranial abnormality.    PHYSICAL EXAM HEENT- Normocephalic, no lesions, without obvious abnormality. Normal external eye and conjunctiva. Card:  RRR Chest:  CTA Abd: Benign  Extremities: Warm and well perfused.  Musculoskeletal-no joint tenderness, deformity or swelling Skin-warm and dry, no hyperpigmentation, vitiligo, or suspicious lesions  Neurological Examination Ment: Intact to complex questions and commands without dysarthria. Speech fluent.  Able to follow complex commands without difficulty. Somewhat pressured speech.    CN: PERRL. Visual fields intact. EOMI without nystagmus. No definite facial droop.  Facial light touch sensation same today.  Hearing intact to conversation. No hypophonia or hoarseness.   Motor: 5/5 x 4. No definite  focal weakness identified. Normal tone throughout; no atrophy noted  Sensory: Intact to FT x 4.   Cerebellar: No ataxia noted.   Gait: Deferred.    ASSESSMENT/PLAN [name] is a 65 y.o. female with history of dysplasia, chronic kidney disease, implantable cardiac defibrillator, coronary artery disease with previous MI, congestive heart failure, diabetes mellitus, hypertension, migraine headaches, previous stroke, and hepatitis presenting with right facial weakness, tongue weakness, and generalized weakness.  She did not receive IV t-PA due to mild  deficits.  Possible TIA:  Dominant   Resultant  toungue and right check numbness; would also consider complicated migraine  MRI  PPM  MRA  PPM  Carotid Doppler Bilateral: No significant (1-39%) ICA stenosis. Antegrade vertebral flow.   2D Echo pending  LDL - 66  HgbA1c 6.9  VTE prophylaxis -  Lovenox  Diet Carb Modified Fluid consistency:: Thin; Room service appropriate?: Yes  aspirin 325 mg daily and clopidogrel 75 mg daily prior to admission, now on aspirin 325 mg daily and clopidogrel 75 mg daily   Patient counseled to be compliant with her antithrombotic medications  Ongoing aggressive stroke risk factor management  Therapy recommendations:  No follow-up physical therapy recommended  Disposition:  Pending  Hypertension  Stable  Permissive hypertension (OK if < 220/120) but gradually normalize in 5-7 days  Hyperlipidemia  Home meds: Zetia 10 mg daily resumed in hospital  LDL 66, goal < 70  Continue statin at discharge  Diabetes  HgbA1c pending,  goal < 7.0  Uncontrolled  Other Stroke Risk Factors  Advanced age  Obesity, Body mass index is 28.81 kg/(m^2).   Hx stroke/TIA  Family hx stroke (Father)  Coronary artery disease  Migraine headaches   Other Active Problems  PPM - no MRI - repeat CT scan in a.m.  Mikey Bussing PA-C [facility] Pager [phone] 01/03/2016, 2:58 PM   Hospital day #   ATTENDING NOTE: Patient did not exhibit embellishment on today's exam.  Apparently her migraines are monthly.  We discussed the possibility of taking something daily to decrease frequency of migraines.  She would like to discuss with Dr. Leonie Man her neurologist,  In the meantime, patient can take magnesium 200mg  daily and riboflavin 200mg  daily to try to decrease HA until seen by Neurology in the next 10-14 days.  If ECHO negative, agree with plan for discharge  Signed:  Dr, Elissa Hefty    To contact Stroke Continuity provider,  please refer to [email or web address] After hours, contact General Neurology

## 2016-01-03 NOTE — Evaluation (Signed)
Occupational Therapy Evaluation Patient Details Name: [name] MRN: [id number] DOB: Jul 24, 1951 Today's Date: 01/03/2016    History of Present Illness 65 yo female with onset of R facial weakness was admitted, PMHx:  MI, CABG, DM, HTN, TIA and CVA   Clinical Impression   Pt reports she was independent with ADLs PTA. Currently pt is overall mod I for functional mobility and ADLs. Pt reports she feels like she has returned to her baseline level with functional activities and pretty much all her symptoms have resolved. Pt planning to d/c home with intermittent supervision from family. No further acute OT needs identified; signing off at this time. Thank you for this referral.     Follow Up Recommendations  No OT follow up;Supervision - Intermittent    Equipment Recommendations  None recommended by OT    Recommendations for Other Services       Precautions / Restrictions Precautions Precautions: Fall;ICD/Pacemaker Restrictions Weight Bearing Restrictions: No      Mobility Bed Mobility Overal bed mobility: Modified Independent                Transfers Overall transfer level: Modified independent Equipment used: None                  Balance Overall balance assessment: Modified Independent;No apparent balance deficits (not formally assessed)                                          ADL Overall ADL's : At baseline;Modified independent                                       General ADL Comments: No family present for OT eval. Pt able to complete UB and LB dressing, grooming activities in standing, toilet transfer with mod I. Pt reports that she feels like she has returned to her baseline level of function. Educated pt on home safety and use of shower chair if pt feels weak; pt verbalized understanding.      Vision Vision Assessment?: No apparent visual deficits   Perception     Praxis      Pertinent Vitals/Pain  Pain Assessment: 0-10 Pain Score: 5  Pain Location: dull headache Pain Descriptors / Indicators: Headache Pain Intervention(s): Monitored during session     Hand Dominance Right   Extremity/Trunk Assessment Upper Extremity Assessment Upper Extremity Assessment: Overall WFL for tasks assessed   Lower Extremity Assessment Lower Extremity Assessment: Defer to PT evaluation   Cervical / Trunk Assessment Cervical / Trunk Assessment: Normal   Communication Communication Communication: No difficulties   Cognition Arousal/Alertness: Lethargic;Suspect due to medications Behavior During Therapy: [facility] for tasks assessed/performed Overall Cognitive Status: Within Functional Limits for tasks assessed                     General Comments       Exercises       Shoulder Instructions      Home Living Family/patient expects to be discharged to:: Private residence Living Arrangements: Children;Spouse/significant other Available Help at Discharge: Family;Available PRN/intermittently Type of Home: House Home Access: Stairs to enter [employer] of Steps: 8 Entrance Stairs-Rails: Left Home Layout: One level     Bathroom Shower/Tub: Occupational psychologist: Standard Bathroom Accessibility: No  Home Equipment: Shower seat;Grab bars - tub/shower          Prior Functioning/Environment Level of Independence: Independent             OT Diagnosis: Generalized weakness;Acute pain   OT Problem List:     OT Treatment/Interventions:      OT Goals(Current goals can be found in the care plan section) Acute Rehab OT Goals OT Goal Formulation: All assessment and education complete, DC therapy  OT Frequency:     Barriers to D/C:            Co-evaluation              End of Session    Activity Tolerance: Patient tolerated treatment well Patient left: in bed;with call bell/phone within reach   Time: 1523-1536 OT Time Calculation (min):  13 min Charges:  OT General Charges $OT Visit: 1 Procedure OT Evaluation $OT Eval Low Complexity: 1 Procedure G-Codes: OT G-codes **NOT FOR INPATIENT CLASS** Functional Assessment Tool Used: Clinical judgement Functional Limitation: Self care Self Care Current Status [id number]): 0 percent impaired, limited or restricted Self Care Goal Status [id number]): 0 percent impaired, limited or restricted Self Care Discharge Status [id number]): 0 percent impaired, limited or restricted   Binnie Kand M.S., OTR/L Pager: [phone]  01/03/2016, 3:44 PM

## 2016-01-03 NOTE — Progress Notes (Signed)
Echocardiogram and carotid ultrasound studies have been completed. Results are pending.   Kerney Elbe, MD

## 2016-01-04 ENCOUNTER — Observation Stay (HOSPITAL_COMMUNITY): Payer: Medicare Other

## 2016-01-04 DIAGNOSIS — G459 Transient cerebral ischemic attack, unspecified: Secondary | ICD-10-CM | POA: Diagnosis not present

## 2016-01-04 DIAGNOSIS — Z95810 Presence of automatic (implantable) cardiac defibrillator: Secondary | ICD-10-CM | POA: Diagnosis not present

## 2016-01-04 DIAGNOSIS — N189 Chronic kidney disease, unspecified: Secondary | ICD-10-CM | POA: Diagnosis not present

## 2016-01-04 DIAGNOSIS — R29818 Other symptoms and signs involving the nervous system: Secondary | ICD-10-CM | POA: Diagnosis not present

## 2016-01-04 DIAGNOSIS — I25810 Atherosclerosis of coronary artery bypass graft(s) without angina pectoris: Secondary | ICD-10-CM | POA: Diagnosis not present

## 2016-01-04 LAB — BASIC METABOLIC PANEL
ANION GAP: 9 (ref 5–15)
BUN: 14 mg/dL (ref 6–20)
CHLORIDE: 105 mmol/L (ref 101–111)
CO2: 30 mmol/L (ref 22–32)
Calcium: 8.8 mg/dL — ABNORMAL LOW (ref 8.9–10.3)
Creatinine, Ser: 1.69 mg/dL — ABNORMAL HIGH (ref 0.44–1.00)
GFR calc Af Amer: 36 mL/min — ABNORMAL LOW (ref >60)
GFR, EST NON AFRICAN AMERICAN: 31 mL/min — AB (ref >60)
GLUCOSE: 71 mg/dL (ref 65–99)
POTASSIUM: 4.3 mmol/L (ref 3.5–5.1)
Sodium: 144 mmol/L (ref 135–145)

## 2016-01-04 LAB — GLUCOSE, CAPILLARY
Glucose-Capillary: 82 mg/dL (ref 65–99)
Glucose-Capillary: 88 mg/dL (ref 65–99)
Glucose-Capillary: 114 mg/dL — ABNORMAL HIGH (ref 65–99)

## 2016-01-04 MED ORDER — MAGNESIUM OXIDE -MG SUPPLEMENT 200 MG PO TABS: 200.0000 mg | ORAL_TABLET | Freq: Every day | ORAL | Status: DC

## 2016-01-04 MED ORDER — RIBOFLAVIN 100 MG PO TABS: 200.0000 mg | ORAL_TABLET | Freq: Every day | ORAL | Status: DC

## 2016-01-04 MED ORDER — CYCLOBENZAPRINE HCL 5 MG PO TABS: 5.0000 mg | ORAL_TABLET | Freq: Every evening | ORAL | Status: DC | PRN

## 2016-01-04 NOTE — Progress Notes (Signed)
Reviewed discharge instructions with patient, staff assisted patient to family vehicle with wheelchair.

## 2016-01-04 NOTE — Discharge Summary (Signed)
Physician Discharge Summary  [name] [id number] DOB: 27-Sep-1951 DOA: 01/01/2016  PCP: Reginia Naas, MD  Admit date: 01/01/2016 Discharge date: 01/04/2016  Time spent: *25 minutes  Recommendations for Outpatient Follow-up: Follow up Neurology in 2 weeks   Discharge Diagnoses:  Principal Problem:   TIA (transient ischemic attack) Active Problems:   Biventricular implantable cardioverter-defibrillator in situ   H/O CABG 1998, multiple caths, cath 01/03/15 showed patent grafts   HTN (hypertension)   DM (diabetes mellitus) ([place])   Chronic renal insufficiency, stage III (moderate)   Ischemic cardiomyopathy   Facial numbness   Other complicated headache syndrome   Discharge Condition: Stable  Diet recommendation: Heart healthy diet  Filed Weights   01/02/16 0114  Weight: 77.3 kg (170 lb 6.7 oz)    History of present illness:  65 y.o. female with hx of MI/ CABG/ ICM, HTN, DM2, TIA / CVA in the past, systolic CHF, anxiety/ depression. She presented to ED with R tongue then facial numbness. Possbily some slurred speech. Seen by neurology and NIHSS score "0" per neurology, no TPA given. They recommend overnight observation. Patient says her symptoms are better but not gone.  In 2011 had TIA w numbness of tongue/ mouth, blurred vision, resolved. CT head/ neck negative. MRI not done d/t ICD.  In Jan 2014 had acute LLE/ LUE weakness which rapidly improved, did not get TPA. DC'd on plavix and ASA, TEE neg.  In Apr 2014 had L hand/ foot weakness. No TPA d/t minmial findings and recent CVA.  Hospital Course:  1. Right tongue and facial numbness- patient continues to have right tongue numbness, CT head is negative for stroke. MRI could not be obtained as patient has ICD in place. Continue aspirin and Plavix. Neurology followed the patient in the hospital. Echo done, showed EF 45-50% with grade 2 diastolic dysfunction. 2. Headache/migraine- headache improved  after she received Toradol yesterday. Patient was started on Flexeril but became very drowsy. Will start magnesium 200 mg daily along with riboflavin 200 mg daily as per neurology recommendation. 3. Chronic diastolic heart failure- continue high-dose Lasix 80 mg daily. 4. Hypertension-blood pressure controlled, continue metoprolol 5. Diabetes mellitus- continue Lantus and sliding scale insulin 6. CAD status post CABG- stable 7. Ischemic cardiomyopathy-status post ICD, patient currently of high-dose Lasix. Stable 8. C KD stage III- creatinine stable, today creatinine is 0.70  Procedures:  Carotid duplex- Bilateral: No significant (1-39%) ICA stenosis. Antegrade vertebral flow.   Echocardiogram- no Cardec source of emboli  Consultations:  Neurology  Discharge Exam: Filed Vitals:   01/04/16 0646 01/04/16 1015  BP: 130/53 144/81  Pulse: 63 80  Temp: 98.4 F (36.9 C) 98.4 F (36.9 C)  Resp: 16 18    General: Appears in no acute distress Cardiovascular: S1-S2 regular Respiratory: Clear to auscultation bilaterally  Discharge Instructions   Discharge Instructions    Diet - low sodium heart healthy    Complete by:  As directed      Increase activity slowly    Complete by:  As directed           Current Discharge Medication List    START taking these medications   Details  Magnesium Oxide 200 MG TABS Take 1 tablet (200 mg total) by mouth daily. Qty: 30 tablet, Refills: 1    Riboflavin 100 MG TABS Take 2 tablets (200 mg total) by mouth daily. Qty: 60 tablet, Refills: 1      CONTINUE these medications which have NOT CHANGED  Details  albuterol (PROVENTIL HFA;VENTOLIN HFA) 108 (90 BASE) MCG/ACT inhaler Inhale 2 puffs into the lungs every 4 (four) hours as needed for wheezing or shortness of breath.    ALPRAZolam (XANAX) 0.5 MG tablet Take 1-2 tablets by mouth 3 (three) times daily. 2 tabs in the AM, 1 at noon, 1 at night    Artificial Tear GEL Apply 2 drops to eye  daily as needed (dry eyes).      aspirin 325 MG EC tablet Take 325 mg by mouth daily.      BIOTIN PO Take 10 mg by mouth daily. Biotin plus keratin    cetirizine (ZYRTEC) 10 MG tablet Take 10 mg by mouth daily.    Cholecalciferol (VITAMIN D-3) 1000 UNITS CAPS Take 1 capsule by mouth daily.     clopidogrel (PLAVIX) 75 MG tablet Take 1 tablet by mouth  daily Qty: 90 tablet, Refills: 3    doxazosin (CARDURA) 4 MG tablet Take 4 mg by mouth at bedtime.     Estradiol 10 MCG TABS vaginal tablet Place 1 tablet (10 mcg total) vaginally 2 (two) times a week. Qty: 8 tablet, Refills: 11    ezetimibe (ZETIA) 10 MG tablet Take 1 tablet by mouth  daily Qty: 90 tablet, Refills: 3    fluticasone (FLONASE) 50 MCG/ACT nasal spray Place 2 sprays into the nose daily as needed for allergies or rhinitis.     furosemide (LASIX) 80 MG tablet Take 1 tablet by mouth two  times daily Qty: 180 tablet, Refills: 3    isosorbide mononitrate (IMDUR) 30 MG 24 hr tablet Take 1 tablet by mouth  daily Qty: 90 tablet, Refills: 3    LANTUS SOLOSTAR 100 UNIT/ML Solostar Pen Inject 18 Units into the skin at bedtime.    metoprolol (LOPRESSOR) 100 MG tablet Take 1 tablet (100 mg total) by mouth 2 (two) times daily. Qty: 180 tablet, Refills: 3   Associated Diagnoses: Paroxysmal SVT (supraventricular tachycardia) (HCC)    Multiple Vitamin (MULTIVITAMIN WITH MINERALS) TABS Take 1 tablet by mouth daily.    NITROSTAT 0.4 MG SL tablet Place 1 tablet under the tongue every 5 minutes as needed for chest pain, max 3 doses, go to er if no relief Qty: 25 tablet, Refills: 2    ondansetron (ZOFRAN ODT) 4 MG disintegrating tablet Take 1 tablet (4 mg total) by mouth every 8 (eight) hours as needed for nausea or vomiting. Qty: 12 tablet, Refills: 0    ondansetron (ZOFRAN) 8 MG tablet Take 8 mg by mouth every 8 (eight) hours as needed. For nausea    pantoprazole (PROTONIX) 40 MG tablet Take 40 mg by mouth daily.    potassium  chloride (K-DUR) 10 MEQ tablet Take 2 tablets (20 mEq total) by mouth 2 (two) times daily. Qty: 360 tablet, Refills: 1    ramipril (ALTACE) 10 MG capsule Take 1 capsule by mouth  twice daily Qty: 180 capsule, Refills: 3    simvastatin (ZOCOR) 20 MG tablet Take 1 tablet by mouth at  bedtime Qty: 90 tablet, Refills: 2    sodium chloride (MURO 128) 5 % ophthalmic ointment Place 1 drop into both eyes at bedtime. For dry eyes    traMADol (ULTRAM) 50 MG tablet Take 100 mg by mouth every 6 (six) hours as needed (MIGRAINES).    venlafaxine (EFFEXOR) 100 MG tablet Take 100-200 mg by mouth See admin instructions. Take 2 tablets by mouth in the am 1 tablet by mouth in the pm  Allergies  Allergen Reactions  . Calcium Channel Blockers Other (See Comments)    Cannot take non-DHP CCBs (diltiazem, verapamil) due to CHF requiring ICD  . Codeine Anaphylaxis  . Ticlid [Ticlopidine Hcl] Other (See Comments)    Unprovoked bleeding while on Ticlid (doesn't think she was on ASA at the time) Takes Plavix without problems  . Morphine And Related Other (See Comments)    Severe AMS, confusion, weakness Tolerates Fentanyl, Tramadol  . Tape Dermatitis and Rash    Tolerates paper tape  . Digoxin And Related Nausea Only    Unknown  . Metolazone Other (See Comments)    Cannot remember reaction  . Myrbetriq [Mirabegron] Other (See Comments)    Aggravates migraines  . Onglyza [Saxagliptin] Other (See Comments)    Exacerbates migraines  . Penicillins Hives    Has patient had a PCN reaction causing immediate rash, facial/tongue/throat swelling, SOB or lightheadedness with hypotension: No Has patient had a PCN reaction causing severe rash involving mucus membranes or skin necrosis: No Has patient had a PCN reaction that required hospitalization No Has patient had a PCN reaction occurring within the last 10 years: No If all of the above answers are "NO", then may proceed with Cephalosporin use.   [name]  Spironolactone Other (See Comments)    Cannot remember reaction  . Sulfa Antibiotics Hives  . Tetracyclines & Related Other (See Comments)    Cannot remember reaction Tolerates macrolides (azithromycin)  . Latex Itching and Rash      The results of significant diagnostics from this hospitalization (including imaging, microbiology, ancillary and laboratory) are listed below for reference.    Significant Diagnostic Studies: Dg Chest 2 View  12/18/2015  CLINICAL DATA:  Acute onset of generalized chest pain. Initial encounter. EXAM: CHEST  2 VIEW COMPARISON:  Chest radiograph performed 01/02/2015 FINDINGS: The lungs are well-aerated and clear. There is no evidence of focal opacification, pleural effusion or pneumothorax. The heart is borderline normal in size. The patient is status post median sternotomy, with evidence of prior CABG. A pacemaker/AICD is noted at the left chest wall, with leads ending at the right atrium and right ventricle. No acute osseous abnormalities are seen. IMPRESSION: No acute cardiopulmonary process seen. Electronically Signed   By: Garald Balding M.D.   On: 12/18/2015 04:24   Ct Head Wo Contrast  01/04/2016  CLINICAL DATA:  Follow-up code stroke. EXAM: CT HEAD WITHOUT CONTRAST TECHNIQUE: Contiguous axial images were obtained from the base of the skull through the vertex without intravenous contrast. COMPARISON:  CT head January 01, 2016 FINDINGS: The ventricles and sulci are normal for age. No intraparenchymal hemorrhage, mass effect nor midline shift. Patchy supratentorial white matter hypodensities are similar. No acute large vascular territory infarcts. Probable LEFT caudate head lacunar infarct. Small old RIGHT cerebellar infarct. No abnormal extra-axial fluid collections. Basal cisterns are patent. Moderate calcific atherosclerosis of the carotid siphons. No skull fracture. The included ocular globes and orbital contents are non-suspicious. The mastoid aircells and  included paranasal sinuses are well-aerated. IMPRESSION: No acute intracranial process. Stable mild to moderate chronic small vessel ischemic disease, advanced for age. Probable LEFT caudate head lacunar infarct. Small old RIGHT cerebellar infarct. Electronically Signed   By: Elon Alas M.D.   On: 01/04/2016 01:20   Ct Head Wo Contrast  01/01/2016  CLINICAL DATA:  Code stroke.  Right-sided weakness. EXAM: CT HEAD WITHOUT CONTRAST TECHNIQUE: Contiguous axial images were obtained from the base of the skull through the vertex without  contrast. COMPARISON:  02/14/2013 FINDINGS: Stable punctate low-density in the left caudate head is suggestive for an old lacune. No evidence for acute hemorrhage, mass lesion, midline shift, hydrocephalus or large infarct. Mucosal disease in the right maxillary sinus. No calvarial fracture. IMPRESSION: No acute intracranial abnormality. Electronically Signed   By: Markus Daft M.D.   On: 01/01/2016 15:50    Microbiology: No results found for this or any previous visit (from the past 240 hour(s)).   Labs: Basic Metabolic Panel:  Recent Labs Lab 01/01/16 1608 01/01/16 1621 01/04/16 0537  NA 144 140 144  K 4.3 3.6 4.3  CL 105 103 105  CO2 28  --  30  GLUCOSE 156* 129* 71  BUN 6 12 14   CREATININE 1.62* 0.70 1.69*  CALCIUM 9.3  --  8.8*   Liver Function Tests:  Recent Labs Lab 01/01/16 1608  AST 30  ALT 26  ALKPHOS 73  BILITOT 0.5  PROT 6.9  ALBUMIN 3.7   CBC:  Recent Labs Lab 01/01/16 1608 01/01/16 1621  WBC 6.5  --   NEUTROABS 3.6  --   HGB 11.7* 13.9  HCT 35.4* 41.0  MCV 86.3  --   PLT 234  --     ProBNP (last 3 results)  Recent Labs  07/25/15 1435  PROBNP 104.0*    CBG:  Recent Labs Lab 01/03/16 1120 01/03/16 1630 01/03/16 2208 01/04/16 0644 01/04/16 0737  GLUCAP 126* 143* 143* 82 88       Signed:  Cayson Kalb S MD.  [facility] 01/04/2016, 10:36 AM

## 2016-01-05 ENCOUNTER — Telehealth: Payer: Self-pay | Admitting: Neurology

## 2016-01-05 NOTE — Telephone Encounter (Signed)
Pt was seen in the ER on 2/23 for possible TIA she would like to know if it is ok for her to travel. Please call and advise [phone]

## 2016-01-05 NOTE — Telephone Encounter (Signed)
The patient left a message on my VM that she was released from the hospital yesterday. She had a Tia and the discharge says to f/u in 2 weeks with Dr Leonie Man. She is a Dr Leonie Man patient but he does not have anything until the end of April. Ehat should we do? Please advise. dg

## 2016-01-05 NOTE — Telephone Encounter (Signed)
Okay to fly. Reviewed her recent hospital admission unclear whether she really had a TIA or stroke.

## 2016-01-05 NOTE — Telephone Encounter (Signed)
Patient wants to know if she can travel via airplane next week.,Pt states she was in the hospital at [facility] for TIA. Message will be sent to [place].

## 2016-01-06 NOTE — Telephone Encounter (Signed)
See note from [id number] duplicate copy.

## 2016-01-06 NOTE — Telephone Encounter (Signed)
Rn call patient that she was cleared to fly per Dr.Sethi. Rn remind patient that she has a follow up appointment in May 2017 with Dr.Sethi.

## 2016-01-13 ENCOUNTER — Other Ambulatory Visit: Payer: Self-pay | Admitting: Interventional Cardiology

## 2016-01-13 MED ORDER — EZETIMIBE 10 MG PO TABS: ORAL_TABLET | ORAL | Status: DC

## 2016-01-20 DIAGNOSIS — F411 Generalized anxiety disorder: Secondary | ICD-10-CM | POA: Diagnosis not present

## 2016-01-20 DIAGNOSIS — F332 Major depressive disorder, recurrent severe without psychotic features: Secondary | ICD-10-CM | POA: Diagnosis not present

## 2016-02-05 ENCOUNTER — Other Ambulatory Visit: Payer: Self-pay | Admitting: Interventional Cardiology

## 2016-02-05 ENCOUNTER — Ambulatory Visit (INDEPENDENT_AMBULATORY_CARE_PROVIDER_SITE_OTHER): Payer: Medicare Other | Admitting: *Deleted

## 2016-02-05 DIAGNOSIS — I255 Ischemic cardiomyopathy: Secondary | ICD-10-CM

## 2016-02-05 DIAGNOSIS — Z95810 Presence of automatic (implantable) cardiac defibrillator: Secondary | ICD-10-CM

## 2016-02-05 NOTE — Progress Notes (Signed)
Remote ICD transmission.   

## 2016-02-18 ENCOUNTER — Ambulatory Visit (INDEPENDENT_AMBULATORY_CARE_PROVIDER_SITE_OTHER): Payer: Medicare Other | Admitting: Interventional Cardiology

## 2016-02-18 ENCOUNTER — Encounter: Payer: Self-pay | Admitting: Interventional Cardiology

## 2016-02-18 VITALS — BP 122/82 | HR 68 | Ht 64.5 in | Wt 173.0 lb

## 2016-02-18 DIAGNOSIS — I10 Essential (primary) hypertension: Secondary | ICD-10-CM

## 2016-02-18 DIAGNOSIS — Z95810 Presence of automatic (implantable) cardiac defibrillator: Secondary | ICD-10-CM

## 2016-02-18 DIAGNOSIS — I471 Supraventricular tachycardia: Secondary | ICD-10-CM

## 2016-02-18 DIAGNOSIS — I255 Ischemic cardiomyopathy: Secondary | ICD-10-CM

## 2016-02-18 DIAGNOSIS — I5022 Chronic systolic (congestive) heart failure: Secondary | ICD-10-CM

## 2016-02-18 DIAGNOSIS — I6340 Cerebral infarction due to embolism of unspecified cerebral artery: Secondary | ICD-10-CM

## 2016-02-18 DIAGNOSIS — N183 Chronic kidney disease, stage 3 (moderate): Secondary | ICD-10-CM

## 2016-02-18 DIAGNOSIS — I25810 Atherosclerosis of coronary artery bypass graft(s) without angina pectoris: Secondary | ICD-10-CM | POA: Diagnosis not present

## 2016-02-18 NOTE — Progress Notes (Signed)
Cardiology Office Note   Date:  02/18/2016   ID:  [name], DOB 12-15-1950, MRN [id number]  PCP:  Reginia Naas, MD  Cardiologist:  Sinclair Grooms, MD   Chief Complaint  Patient presents with  . Congestive Heart Failure      History of Present Illness: [name] is a 64 y.o. female who presents for CAD, CHF, Defibrillator, ischemic cardiomyopathy, mitral valve disease with moderate regurgitation, diabetes mellitus, and ischemic stroke history.  [name]  is doing relatively well. For some reason I cannot convince her that daily weights would be helpful. She states that on occasion she gets a rattling in her chest and is unable to lie flat. When this happens she would take an additional diuretic dose at night. She has not had angina. She denies syncope. Her appetite is been stable. She denies prolonged palpitations and is not had significant lower extremity swelling. On average she would take additional Lasix 2-3 times per month.    Past Medical History  Diagnosis Date  . Ischemic cardiomyopathy     status post biventricular ICD placed by DR Edumunds who used to see Dr Melvern Banker here to establish  cardiovascular care.  . Hypertension   . Coronary artery disease   . CHF (congestive heart failure) ([place])   . MVP (mitral valve prolapse)     Antibiotics not required for procedures  . Asthma   . Anxiety   . Depression   . Hiatal hernia   . Cervical dysplasia   . Fibroid   . Function kidney decreased   . History of shingles     2-3 yrs ago last out break "around waist"  . ICD (implantable cardiac defibrillator) in place   . Pacemaker   . Complication of anesthesia     "I wake up during surgeries" (02/14/2013)  . Anginal pain ([place])   . Myocardial infarction ([place])     "I've had 2; the others they were able to catch before completing" (02/14/2013)  . Pneumonia 1950's & 1985  . Type II diabetes mellitus ([place])   . Iron deficiency anemia   . GERD  (gastroesophageal reflux disease)   . History of stomach ulcers   . Migraines   . Stroke [facility])     "2 confirmed; 9 TIA's; results in dragging LLE; numbness in tip of tongue" (02/14/2013),10-06-15 right hand tends to be weaker when tired.  . Shortness of breath     "lying down flat; at times w/exertion" (02/14/2013). 10-06-15 exertion only..  . Chronic kidney disease     Dr. Posey Pronto follows.  . Back pain     "pinched nerve-lower back" - Dr. Nelva Bush follows.  . Hepatitis     Hepatitis A -college yrs"water source exposure"  . AICD (automatic cardioverter/defibrillator) present     [employer]- Dr. Beckie Salts follows    Past Surgical History  Procedure Laterality Date  . Insert / replace / remove pacemaker      biventricular defibrillator--06/10/ 2009  . Abdominal hysterectomy  1985    TAH   . Cardiac defibrillator placement    . Coronary angioplasty with stent placement      "started out w/5; bypass corrected some; 1 stent since the bypass" (02/14/2013)  . Breast excisional biopsy Left 01/2007; 06/2007; 03/2008    "benign" (02/14/2013)  . Cardiac catheterization      "probably in the teens" (02/14/2013)  . Colonoscopy  ~ 2002  . Tee without cardioversion  11/14/2012  Procedure: TRANSESOPHAGEAL ECHOCARDIOGRAM (TEE);  Surgeon: Candee Furbish, MD;  Location: [facility] ENDOSCOPY;  Service: Cardiovascular;  Laterality: N/A;  . Supraventricular tachycardia ablation  06/2007  . Biv icd genertaor change out  06/2007; 04/2008    "2 lead initial placement, at [facility]; done at [facility], after developing CHF" (02/14/2013)  . Coronary artery bypass graft  ` 1998    LIMA-LAD, SVG-D1, SVG-PDA  . Breast surgery    . Dilation and curettage of uterus  1975 X 2; 1976; 1977  . Left heart catheterization with coronary/graft angiogram N/A 01/03/2015    Procedure: LEFT HEART CATHETERIZATION WITH Beatrix Fetters;  Surgeon: Sinclair Grooms, MD;  Location: [facility] CATH LAB;  Service: Cardiovascular;  Laterality: N/A;  . Biv pacemaker  generator change out N/A 01/29/2015    Procedure: BIV PACEMAKER GENERATOR CHANGE OUT;  Surgeon: Evans Lance, MD;  Location: [facility] CATH LAB;  Service: Cardiovascular;  Laterality: N/A;  . Colonoscopy with propofol N/A 10/07/2015    Procedure: COLONOSCOPY WITH PROPOFOL;  Surgeon: Juanita Craver, MD;  Location: [facility] ENDOSCOPY;  Service: Endoscopy;  Laterality: N/A;     Current Outpatient Prescriptions  Medication Sig Dispense Refill  . albuterol (PROVENTIL HFA;VENTOLIN HFA) 108 (90 BASE) MCG/ACT inhaler Inhale 2 puffs into the lungs every 4 (four) hours as needed for wheezing or shortness of breath.    . ALPRAZolam (XANAX) 0.5 MG tablet Take 1-2 tablets by mouth 3 (three) times daily. 2 tabs in the AM, 1 at noon, 1 at night    . Artificial Tear GEL Apply 2 drops to eye daily as needed (dry eyes). \    . aspirin 325 MG EC tablet Take 325 mg by mouth daily.      [name] BIOTIN PO Take 10 mg by mouth daily. Biotin plus keratin    . cetirizine (ZYRTEC) 10 MG tablet Take 10 mg by mouth daily.    . Cholecalciferol (VITAMIN D-3) 1000 UNITS CAPS Take 1 capsule by mouth daily.     . clopidogrel (PLAVIX) 75 MG tablet Take 1 tablet by mouth  daily 90 tablet 3  . cyclobenzaprine (FLEXERIL) 5 MG tablet Take 1 tablet (5 mg total) by mouth at bedtime as needed for muscle spasms. 30 tablet 0  . doxazosin (CARDURA) 4 MG tablet Take 4 mg by mouth at bedtime.     . Estradiol 10 MCG TABS vaginal tablet Place 1 tablet (10 mcg total) vaginally 2 (two) times a week. (Patient taking differently: Place 1 tablet vaginally 2 (two) times a week. Sunday, Thursday) 8 tablet 11  . ezetimibe (ZETIA) 10 MG tablet Take 1 tablet by mouth  daily 90 tablet 0  . fluticasone (FLONASE) 50 MCG/ACT nasal spray Place 2 sprays into the nose daily as needed for allergies or rhinitis.     . furosemide (LASIX) 80 MG tablet Take 1 tablet by mouth two  times daily 180 tablet 3  . isosorbide mononitrate (IMDUR) 30 MG 24 hr tablet Take 1 tablet by mouth   daily 90 tablet 1  . LANTUS SOLOSTAR 100 UNIT/ML Solostar Pen Inject 18 Units into the skin at bedtime.    . Magnesium Oxide 200 MG TABS Take 1 tablet (200 mg total) by mouth daily. 30 tablet 1  . metoprolol (LOPRESSOR) 100 MG tablet Take 1 tablet by mouth two  times daily 180 tablet 0  . Multiple Vitamin (MULTIVITAMIN WITH MINERALS) TABS Take 1 tablet by mouth daily.    [name] NITROSTAT 0.4 MG SL tablet Place  1 tablet under the tongue every 5 minutes as needed for chest pain, max 3 doses, go to er if no relief 25 tablet 2  . ondansetron (ZOFRAN ODT) 4 MG disintegrating tablet Take 1 tablet (4 mg total) by mouth every 8 (eight) hours as needed for nausea or vomiting. 12 tablet 0  . ondansetron (ZOFRAN) 8 MG tablet Take 8 mg by mouth every 8 (eight) hours as needed. For nausea    . pantoprazole (PROTONIX) 40 MG tablet Take 40 mg by mouth daily.    . potassium chloride (K-DUR) 10 MEQ tablet Take 2 tablets (20 mEq total) by mouth 2 (two) times daily. 360 tablet 1  . ramipril (ALTACE) 10 MG capsule Take 1 capsule by mouth  twice daily 180 capsule 3  . Riboflavin 100 MG TABS Take 2 tablets (200 mg total) by mouth daily. 60 tablet 1  . simvastatin (ZOCOR) 20 MG tablet Take 1 tablet by mouth at  bedtime 90 tablet 2  . sodium chloride (MURO 128) 5 % ophthalmic ointment Place 1 drop into both eyes at bedtime. For dry eyes    . traMADol (ULTRAM) 50 MG tablet Take 100 mg by mouth every 6 (six) hours as needed (MIGRAINES).    [name] venlafaxine (EFFEXOR) 100 MG tablet Take 100-200 mg by mouth See admin instructions. Take 2 tablets by mouth in the am 1 tablet by mouth in the pm    . Insulin Glargine (BASAGLAR KWIKPEN) 100 UNIT/ML SOPN Inject 18 Units into the skin at bedtime. Reported on 02/18/2016     No current facility-administered medications for this visit.    Allergies:   Calcium channel blockers; Codeine; Ticlid; Morphine and related; Tape; Digoxin and related; Metolazone; Myrbetriq; Onglyza; Penicillins;  Spironolactone; Sulfa antibiotics; Tetracyclines & related; and Latex    Social History:  The patient  reports that she has never smoked. She has never used smokeless tobacco. She reports that she does not drink alcohol or use illicit drugs.   Family History:  The patient's family history includes Diabetes in her brother, father, and paternal grandmother; Heart attack in her brother, father, and mother; Heart disease in her brother, father, and mother; Hypertension in her father and mother; Kidney failure in her brother and father; Stroke in her father and mother.    ROS:  Please see the history of present illness.   Otherwise, review of systems are positive for Excessive sweating, leg pain, wheezing, anxiety, difficulty urinating, headaches, back pain, depression, vision disturbance, and chest discomfort.   All other systems are reviewed and negative.    PHYSICAL EXAM: VS:  BP 122/82 mmHg  Pulse 68  Ht 5' 4.5" (1.638 m)  Wt 173 lb (78.472 kg)  BMI 29.25 kg/m2 , BMI Body mass index is 29.25 kg/(m^2). GEN: Well nourished, well developed, in no acute distress HEENT: normal Neck: no JVD, carotid bruits, or masses Cardiac: RRR.  There is a 2/6 apical systolic murmur, but no rub, or gallop. There is no edema. Respiratory:  clear to auscultation bilaterally, normal work of breathing. GI: soft, nontender, nondistended, + BS MS: no deformity or atrophy Skin: warm and dry, no rash Neuro:  Strength and sensation are intact Psych: euthymic mood, full affect   EKG:  EKG is not ordered today.    Recent Labs: 07/25/2015: Pro B Natriuretic peptide (BNP) 104.0* 01/01/2016: ALT 26; Hemoglobin 13.9; Platelets 234 01/04/2016: BUN 14; Creatinine, Ser 1.69*; Potassium 4.3; Sodium 144    Lipid Panel    Component  Value Date/Time   CHOL 133 01/02/2016 0730   TRIG 131 01/02/2016 0730   HDL 41 01/02/2016 0730   CHOLHDL 3.2 01/02/2016 0730   VLDL 26 01/02/2016 0730   LDLCALC 66 01/02/2016 0730        Wt Readings from Last 3 Encounters:  02/18/16 173 lb (78.472 kg)  01/02/16 170 lb 6.7 oz (77.3 kg)  10/07/15 168 lb (76.204 kg)      Other studies Reviewed: Additional studies/ records that were reviewed today include: Recent emergency room visit.. The findings include possible TIA during a recent hospital visit..    ASSESSMENT AND PLAN:  1. Ischemic cardiomyopathy Treated with heart failure therapy, anti-ischemic therapy, and AICD.  2. Chronic systolic heart failure (HCC) No evidence of volume overload  3. Biventricular implantable cardioverter-defibrillator in situ Followed in device clinic  4. Coronary artery disease involving coronary bypass graft of native heart without angina pectoris No recent anginal episodes  5. Paroxysmal SVT (supraventricular tachycardia) (HCC) No recent episodes of SVT  6. CKD (chronic kidney disease), stage 3 (moderate) CK stage III with most recent creatinine of 1.7  7. Cerebral infarction due to embolism of cerebral artery (HCC) Stable    Current medicines are reviewed at length with the patient today.  The patient has the following concerns regarding medicines: We again discussed daily weights under similar circumstances to help facilitate volume management. We'll we have this conversation she seems to understand but never quite gets around to an organized approach..  The following changes/actions have been instituted:    Daily weights under similar circumstances daily  Report Baseline weight to [place]  A sliding scale for diuretic therapy will be established if week and give this baseline information.  No particular testing seems indicated at this time  Continue to have device monitored in clinic  Labs/ tests ordered today include:  No orders of the defined types were placed in this encounter.     Disposition:   FU with HS in 7 months  Signed, Sinclair Grooms, MD  02/18/2016 2:47 PM    [address], [place], [place]  [address] Phone: [phone]; Fax: [phone]

## 2016-02-18 NOTE — Patient Instructions (Signed)
Medication Instructions:  Your physician recommends that you continue on your current medications as directed. Please refer to the Current Medication list given to you today.   Labwork: None ordered  Testing/Procedures: None ordeed  Follow-Up: Your physician wants you to follow-up in: 6 months with Dr.Smith You will receive a reminder letter in the mail two months in advance. If you don't receive a letter, please call our office to schedule the follow-up appointment.   Any Other Special Instructions Will Be Listed Below (If Applicable). Your physician recommends that you weigh, daily, at the same time every day, and in the same amount of clothing. Please record your daily weights. Please call the office to provide your "dry" weight        If you need a refill on your cardiac medications before your next appointment, please call your pharmacy.

## 2016-02-19 LAB — CUP PACEART REMOTE DEVICE CHECK
Battery Remaining Longevity: 110 mo
Battery Voltage: 3.01 V
Brady Statistic AP VP Percent: 0.06 %
Brady Statistic AS VP Percent: 98.58 %
Date Time Interrogation Session: 20170330082305
HIGH POWER IMPEDANCE MEASURED VALUE: 38 Ohm
HIGH POWER IMPEDANCE MEASURED VALUE: 46 Ohm
Implantable Lead Location: 753859
Implantable Lead Model: 4194
Implantable Lead Model: 5076
Lead Channel Impedance Value: 342 Ohm
Lead Channel Impedance Value: 456 Ohm
Lead Channel Pacing Threshold Amplitude: 0.875 V
Lead Channel Pacing Threshold Amplitude: 1 V
Lead Channel Pacing Threshold Pulse Width: 0.4 ms
Lead Channel Pacing Threshold Pulse Width: 0.4 ms
Lead Channel Sensing Intrinsic Amplitude: 4 mV
Lead Channel Sensing Intrinsic Amplitude: 4 mV
Lead Channel Setting Pacing Amplitude: 1.5 V
Lead Channel Setting Pacing Pulse Width: 0.4 ms
MDC IDC LEAD IMPLANT DT: 20090610
MDC IDC LEAD IMPLANT DT: 20090610
MDC IDC LEAD IMPLANT DT: 20090610
MDC IDC LEAD LOCATION: 753858
MDC IDC LEAD LOCATION: 753860
MDC IDC MSMT LEADCHNL LV IMPEDANCE VALUE: 304 Ohm
MDC IDC MSMT LEADCHNL LV IMPEDANCE VALUE: 475 Ohm
MDC IDC MSMT LEADCHNL LV IMPEDANCE VALUE: 646 Ohm
MDC IDC MSMT LEADCHNL RA PACING THRESHOLD AMPLITUDE: 0.625 V
MDC IDC MSMT LEADCHNL RA PACING THRESHOLD PULSEWIDTH: 0.4 ms
MDC IDC MSMT LEADCHNL RA SENSING INTR AMPL: 2.625 mV
MDC IDC MSMT LEADCHNL RA SENSING INTR AMPL: 2.625 mV
MDC IDC MSMT LEADCHNL RV IMPEDANCE VALUE: 456 Ohm
MDC IDC SET LEADCHNL LV PACING AMPLITUDE: 2 V
MDC IDC SET LEADCHNL RV SENSING SENSITIVITY: 0.3 mV
MDC IDC STAT BRADY AP VS PERCENT: 0.02 %
MDC IDC STAT BRADY AS VS PERCENT: 1.35 %
MDC IDC STAT BRADY RA PERCENT PACED: 0.07 %
MDC IDC STAT BRADY RV PERCENT PACED: 0.9 %

## 2016-02-24 ENCOUNTER — Encounter: Payer: Self-pay | Admitting: Cardiology

## 2016-03-08 ENCOUNTER — Other Ambulatory Visit: Payer: Self-pay | Admitting: Interventional Cardiology

## 2016-03-15 IMAGING — CT CT HEAD W/O CM
1 series · 16 of 30 positions shown, 20 images · non-contrast
Comparison: 02/14/2013

CLINICAL DATA: Code stroke.  Right-sided weakness.

EXAM:
CT HEAD WITHOUT CONTRAST
TECHNIQUE: Contiguous axial images were obtained from the base of the skull
through the vertex without contrast.

[Series 2: head 5.0 h30s · axial · 0.40mm/px · z∈[-104,+31]mm · 16 of 31 slices shown, 20 images]
[im 2/31  brain]
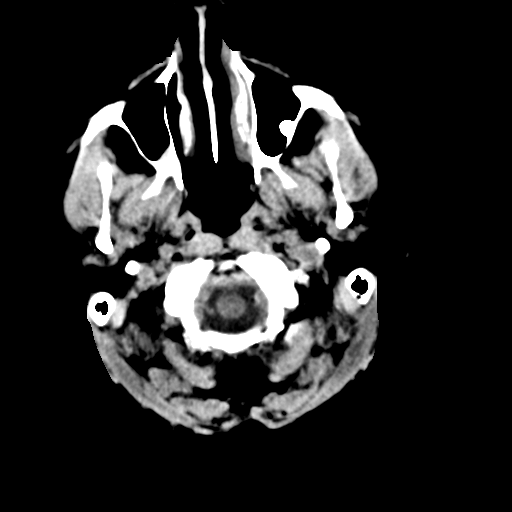
[im 2/31  bone]
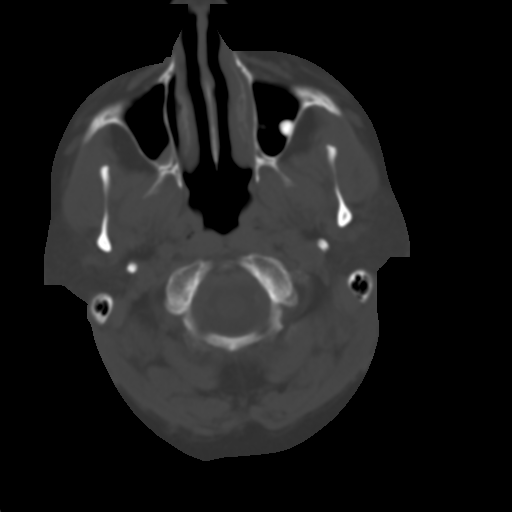
[im 4/31  brain]
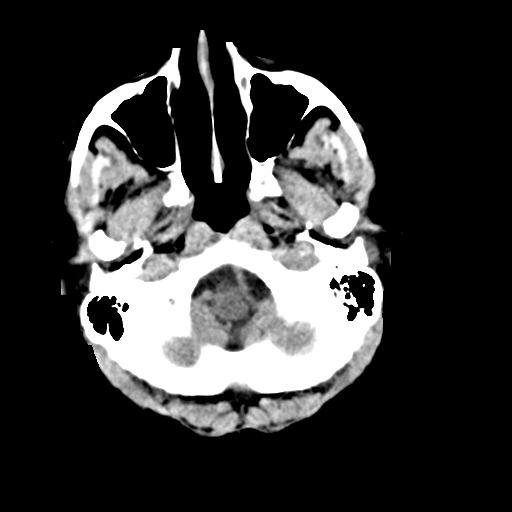
[im 6/31  brain]
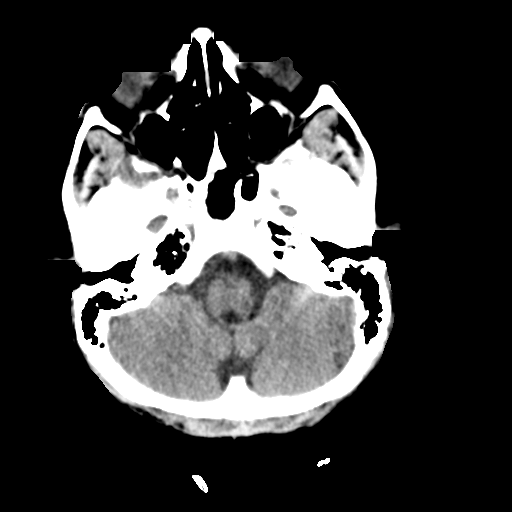
[im 8/31  brain]
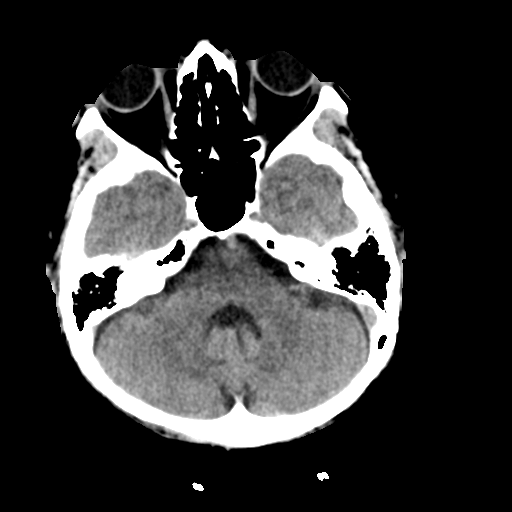
[im 9/31  brain]
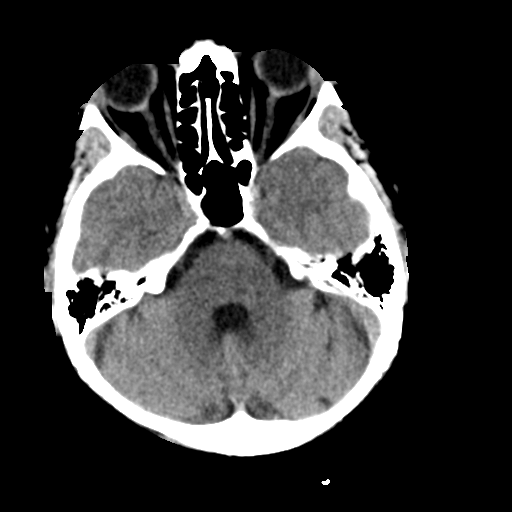
[im 9/31  bone]
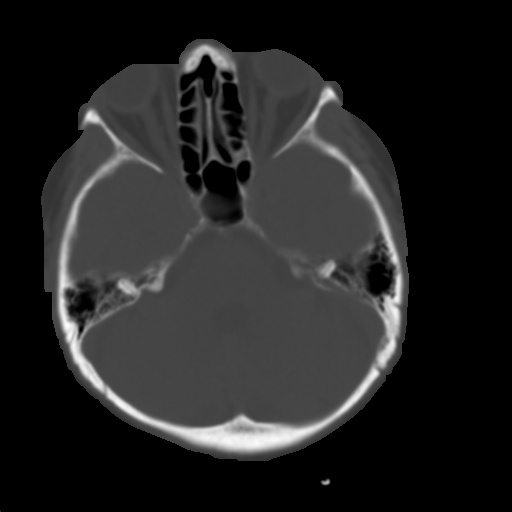
[im 11/31  brain]
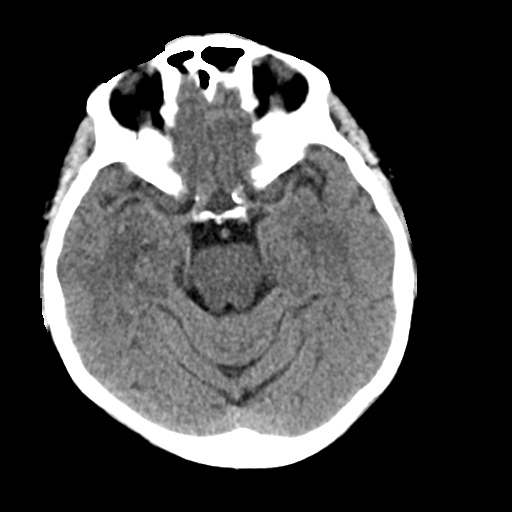
[im 13/31  brain]
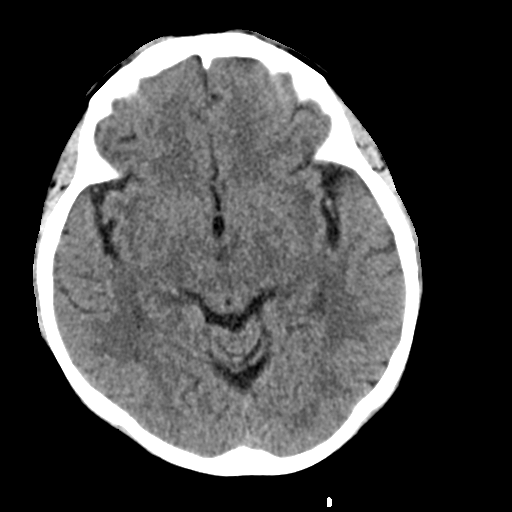
[im 15/31  brain]
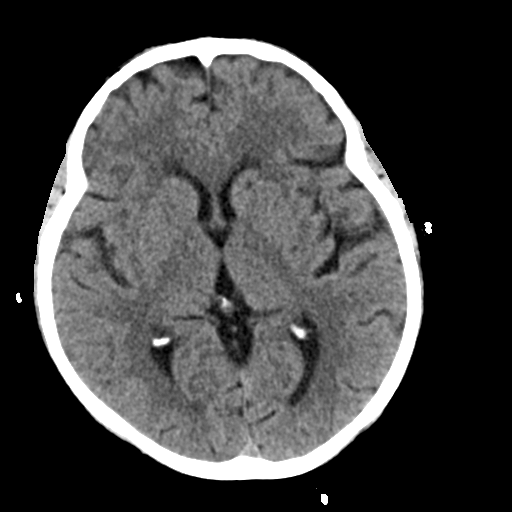
[im 16/31  brain]
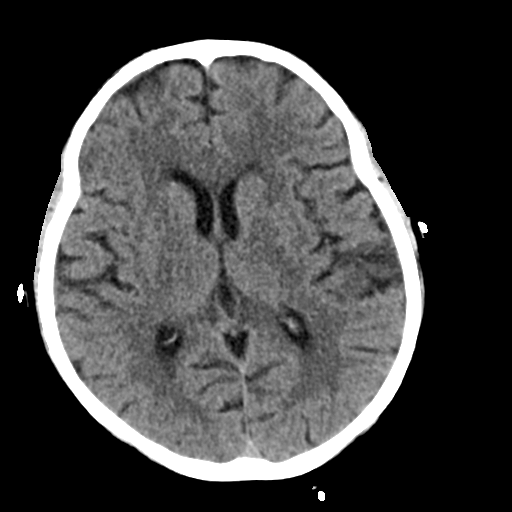
[im 16/31  bone]
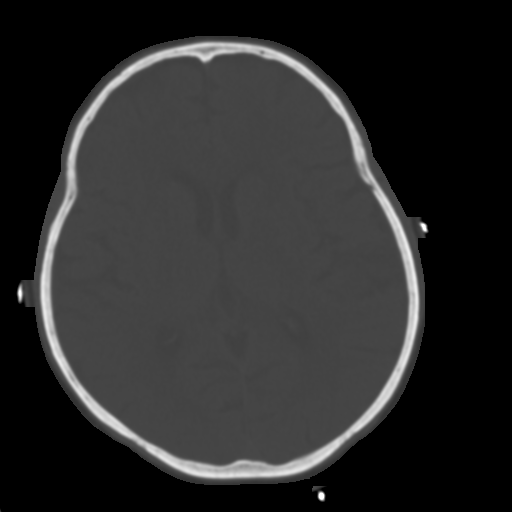
[im 18/31  brain]
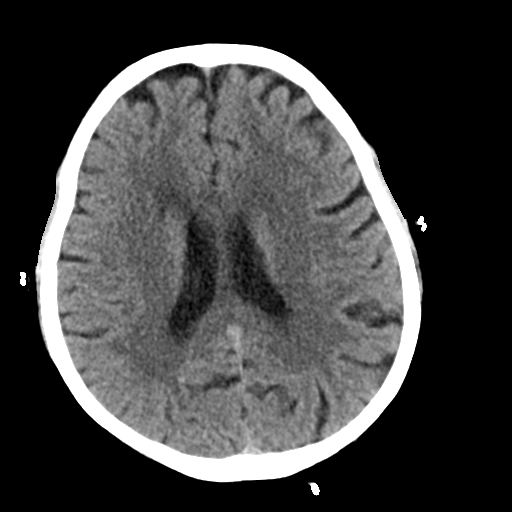
[im 20/31  brain]
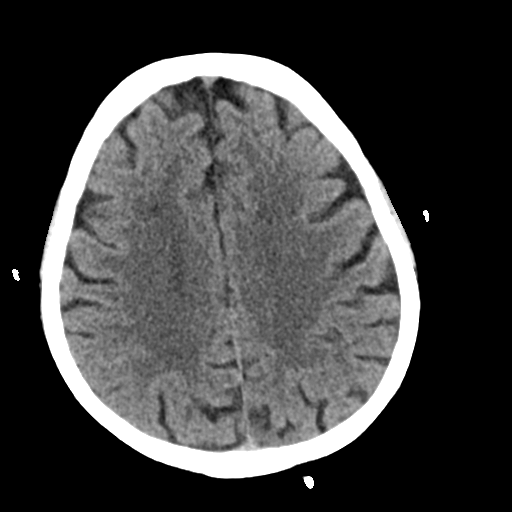
[im 22/31  brain]
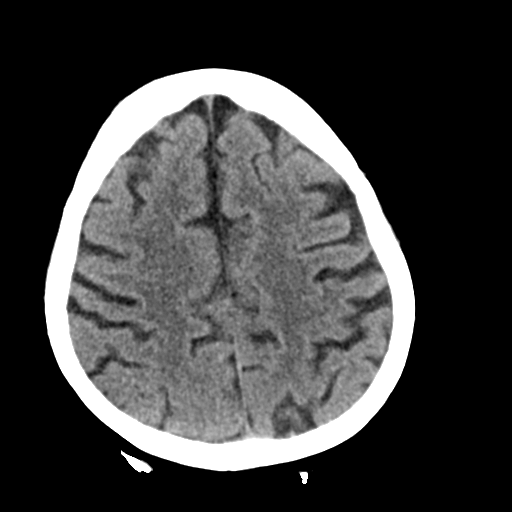
[im 23/31  brain]
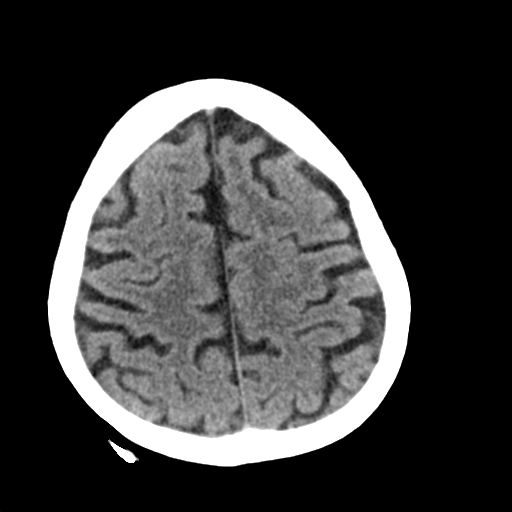
[im 23/31  bone]
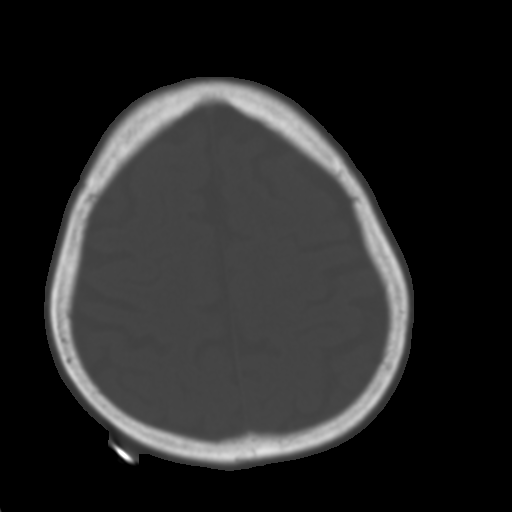
[im 25/31  brain]
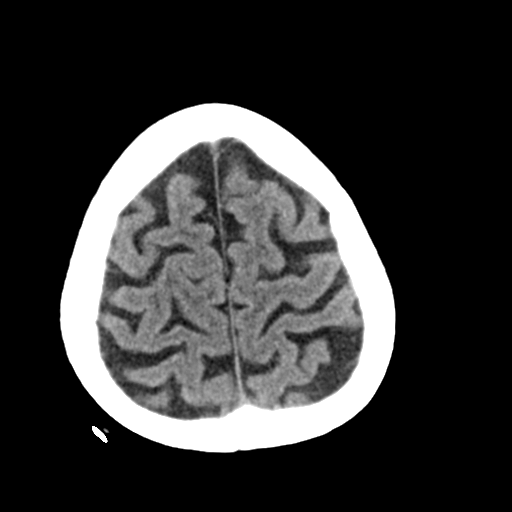
[im 27/31  brain]
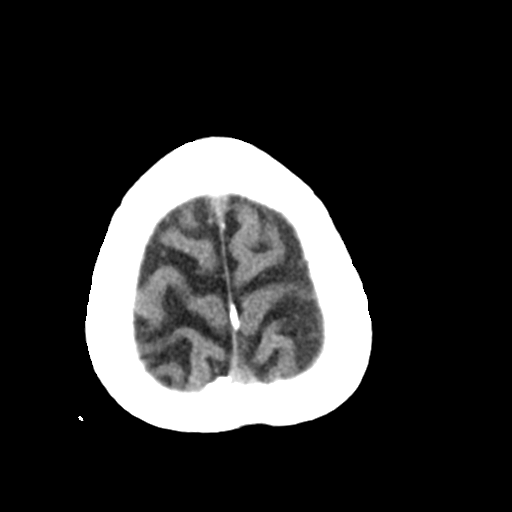
[im 29/31  brain]
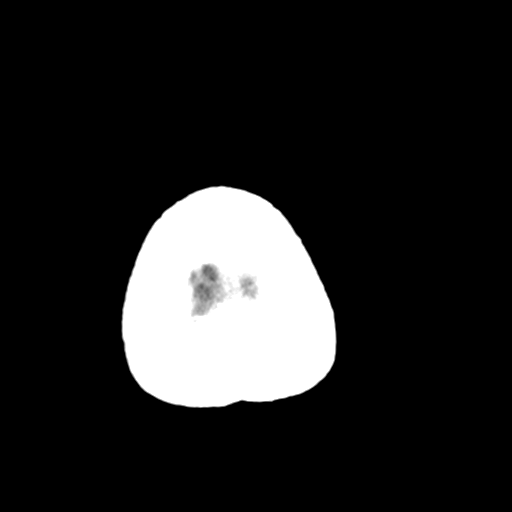

[16 of 30 positions shown; findings below may reference images not displayed]

FINDINGS: Stable punctate low-density in the left caudate head is suggestive
for an old lacune. No evidence for acute hemorrhage, mass lesion,
midline shift, hydrocephalus or large infarct. Mucosal disease in
the right maxillary sinus. No calvarial fracture.
IMPRESSION: No acute intracranial abnormality.

## 2016-03-18 ENCOUNTER — Ambulatory Visit: Payer: Medicare Other | Admitting: Neurology

## 2016-03-18 ENCOUNTER — Telehealth: Payer: Self-pay | Admitting: Neurology

## 2016-03-18 NOTE — Telephone Encounter (Signed)
Pt called said she just got up and has an appt at 3:30 with Dr Leonie Man today. She said she takes a lot of medications and she sleeps a lot, right after she takes her morning meds she goes back to sleep after sleeping all night.  sts she slept thru the alarm. FYI

## 2016-03-18 NOTE — Telephone Encounter (Signed)
Rn receive message that patient cancel appt for 0330pm.

## 2016-03-19 ENCOUNTER — Ambulatory Visit (INDEPENDENT_AMBULATORY_CARE_PROVIDER_SITE_OTHER): Payer: Medicare Other | Admitting: Nurse Practitioner

## 2016-03-19 ENCOUNTER — Encounter: Payer: Self-pay | Admitting: Neurology

## 2016-03-19 ENCOUNTER — Encounter: Payer: Self-pay | Admitting: Nurse Practitioner

## 2016-03-19 VITALS — BP 135/74 | HR 67 | Ht 64.5 in | Wt 173.0 lb

## 2016-03-19 DIAGNOSIS — N183 Chronic kidney disease, stage 3 (moderate): Secondary | ICD-10-CM | POA: Diagnosis not present

## 2016-03-19 DIAGNOSIS — I255 Ischemic cardiomyopathy: Secondary | ICD-10-CM | POA: Diagnosis not present

## 2016-03-19 DIAGNOSIS — I6340 Cerebral infarction due to embolism of unspecified cerebral artery: Secondary | ICD-10-CM | POA: Diagnosis not present

## 2016-03-19 DIAGNOSIS — G459 Transient cerebral ischemic attack, unspecified: Secondary | ICD-10-CM

## 2016-03-19 DIAGNOSIS — E1122 Type 2 diabetes mellitus with diabetic chronic kidney disease: Secondary | ICD-10-CM

## 2016-03-19 DIAGNOSIS — I10 Essential (primary) hypertension: Secondary | ICD-10-CM

## 2016-03-19 NOTE — Progress Notes (Signed)
[facility]  PATIENT: [name] DOB: [birth year]   REASON FOR VISIT: Follow-up for history of stroke  HISTORY FROM: Patient    HISTORY OF PRESENT ILLNESS: HISTORY PSVenice T [name] is an 65 y.o. female who on 11/11/12 awoke with left sided weakness and difficulty with her speech.  Although patient could not have a MRI due to AICD it was felt by stroke team she did suffer a CVA. At that time TEE was negative for thrombus or PFO and it was felt Pradaxa was not indicated and was placed on both Plavix and ASA. Today patient was in cardiac rehab when at 0830 she noted left arm paresthesia which then progressed to both left arm and leg over a 20 minute period of time. Code stroke was called and patient was brought to CT. CT head was negative for acute CVA. On examination her NIHSS was 1 and patient continued to feel left arm, leg and face decreased sensation.   UPDATE 06/08/13 (LL): Patient comes to office for stroke follow up. She reports that she is doing very well and has had no new TIA symptoms (left arm and leg weakness) since the last episode while at cardiac rehab on 02/15/13. She has mild word hesitancy when she speaks. She takes Plavix and ASA 325 mg daily. Patient denies medication side effects, with no signs of bleeding or bruising. No new complaints.  UPDATE 02/11/14 (LL): Patient comes to office for stroke follow up. She is doing well without any repeat stroke or TIA symptoms. Her husband, daughter, and brother have all had heart problems recently and she is helping to care for all of them. Her daughter had to have an pacemaker implanted, her brother and husband both had MIs and needed bypass surgery. She takes Plavix and ASA 325 mg daily. Patient denies medication side effects, with no signs of bleeding or bruising. No new complaints.  Update 09/18/2015 PS: She is seen for follow-up today after last visit a year and a half ago. She continues to do well  without recurrent stroke or TIA symptoms now. She is concerned about an upcoming schedule colonoscopy on 10/07/15 and risk for TIA/stroke while she has to hold her antiplatelet medications for 5 days. She had a possible TIA episode 2 years ago when she had to hold her antiplatelet drugs for her scheduled injection in her spine. Patient recently had a stool test which was positive and there is concern for cancer and hence colonoscopy is necessary. She states her blood pressure is well controlled and usually runs in the 120s though it is slightly elevated today at 135/83. She is tolerating Zocor quite well and had lipid profile checked by her primary physician 3 weeks ago and was fine. She had a good summer and returned from a long trip in [place]. She recently had her pacemaker changed and the procedure went well. She is on aspirin and Plavix and tolerating it well without bleeding or bruising  UPDATE 05/12/2017CM [name] , 65 year old female returns for follow-up she continues to do well without further stroke or TIA symptoms ,she was admitted to the hospital briefly back in February. She had multiple neurologic complaints without definite localized findings. CT of the head revealed chronic lacunar infarction. MRI cannot be obtained due to AICD. She remains on aspirin and Plavix. Blood pressure is well controlled in the office today at 134/74. Recent hemoglobin A1c 6.9. Recent LDL 66. She gets no regular exercise she returns for reevaluation  REVIEW OF  SYSTEMS: Full 14 system review of systems performed and notable only for those listed, all others are neg:  Constitutional: neg  Cardiovascular: neg Ear/Nose/Throat: neg  Skin: neg Eyes: neg Respiratory: neg Gastroitestinal: neg  Hematology/Lymphatic: neg  Endocrine: neg Musculoskeletal:neg Allergy/Immunology: neg Neurological: neg Psychiatric: neg Sleep : neg   ALLERGIES: Allergies  Allergen Reactions  . Calcium Channel Blockers Other  (See Comments)    Cannot take non-DHP CCBs (diltiazem, verapamil) due to CHF requiring ICD  . Codeine Anaphylaxis  . Ticlid [Ticlopidine Hcl] Other (See Comments)    Unprovoked bleeding while on Ticlid (doesn't think she was on ASA at the time) Takes Plavix without problems  . Morphine And Related Other (See Comments)    Severe AMS, confusion, weakness Tolerates Fentanyl, Tramadol  . Tape Dermatitis and Rash    Tolerates paper tape  . Digoxin And Related Nausea Only    Unknown  . Metolazone Other (See Comments)    Cannot remember reaction  . Myrbetriq [Mirabegron] Other (See Comments)    Aggravates migraines  . Onglyza [Saxagliptin] Other (See Comments)    Exacerbates migraines  . Penicillins Hives    Has patient had a PCN reaction causing immediate rash, facial/tongue/throat swelling, SOB or lightheadedness with hypotension: No Has patient had a PCN reaction causing severe rash involving mucus membranes or skin necrosis: No Has patient had a PCN reaction that required hospitalization No Has patient had a PCN reaction occurring within the last 10 years: No If all of the above answers are "NO", then may proceed with Cephalosporin use.   [name] Spironolactone Other (See Comments)    Cannot remember reaction  . Sulfa Antibiotics Hives  . Tetracyclines & Related Other (See Comments)    Cannot remember reaction Tolerates macrolides (azithromycin)  . Latex Itching and Rash    HOME MEDICATIONS: Outpatient Prescriptions Prior to Visit  Medication Sig Dispense Refill  . albuterol (PROVENTIL HFA;VENTOLIN HFA) 108 (90 BASE) MCG/ACT inhaler Inhale 2 puffs into the lungs every 4 (four) hours as needed for wheezing or shortness of breath.    . ALPRAZolam (XANAX) 0.5 MG tablet Take 1-2 tablets by mouth 3 (three) times daily. 2 tabs in the AM, 1 at noon, 1 at night    . Artificial Tear GEL Apply 2 drops to eye daily as needed (dry eyes). \    . aspirin 325 MG EC tablet Take 325 mg by mouth daily.       [name] BIOTIN PO Take 10 mg by mouth daily. Biotin plus keratin    . cetirizine (ZYRTEC) 10 MG tablet Take 10 mg by mouth daily.    . Cholecalciferol (VITAMIN D-3) 1000 UNITS CAPS Take 1 capsule by mouth daily.     . clopidogrel (PLAVIX) 75 MG tablet Take 1 tablet by mouth  daily 90 tablet 3  . cyclobenzaprine (FLEXERIL) 5 MG tablet Take 1 tablet (5 mg total) by mouth at bedtime as needed for muscle spasms. 30 tablet 0  . doxazosin (CARDURA) 4 MG tablet Take 4 mg by mouth at bedtime.     . Estradiol 10 MCG TABS vaginal tablet Place 1 tablet (10 mcg total) vaginally 2 (two) times a week. (Patient taking differently: Place 1 tablet vaginally 2 (two) times a week. Sunday, Thursday) 8 tablet 11  . fluticasone (FLONASE) 50 MCG/ACT nasal spray Place 2 sprays into the nose daily as needed for allergies or rhinitis.     . furosemide (LASIX) 80 MG tablet Take 1 tablet by mouth two  times daily 180 tablet 3  . Insulin Glargine (BASAGLAR KWIKPEN) 100 UNIT/ML SOPN Inject 18 Units into the skin at bedtime. Reported on 02/18/2016    . isosorbide mononitrate (IMDUR) 30 MG 24 hr tablet Take 1 tablet by mouth  daily 90 tablet 1  . LANTUS SOLOSTAR 100 UNIT/ML Solostar Pen Inject 18 Units into the skin at bedtime.    . metoprolol (LOPRESSOR) 100 MG tablet Take 1 tablet by mouth two  times daily 180 tablet 3  . Multiple Vitamin (MULTIVITAMIN WITH MINERALS) TABS Take 1 tablet by mouth daily.    [name] NITROSTAT 0.4 MG SL tablet Place 1 tablet under the tongue every 5 minutes as needed for chest pain, max 3 doses, go to er if no relief 25 tablet 2  . ondansetron (ZOFRAN ODT) 4 MG disintegrating tablet Take 1 tablet (4 mg total) by mouth every 8 (eight) hours as needed for nausea or vomiting. 12 tablet 0  . ondansetron (ZOFRAN) 8 MG tablet Take 8 mg by mouth every 8 (eight) hours as needed. Reported on 03/19/2016    . pantoprazole (PROTONIX) 40 MG tablet Take 40 mg by mouth daily.    . potassium chloride (K-DUR) 10 MEQ tablet  Take 2 tablets (20 mEq total) by mouth 2 (two) times daily. 360 tablet 1  . ramipril (ALTACE) 10 MG capsule Take 1 capsule by mouth  twice daily 180 capsule 3  . simvastatin (ZOCOR) 20 MG tablet Take 1 tablet by mouth at  bedtime 90 tablet 2  . sodium chloride (MURO 128) 5 % ophthalmic ointment Place 1 drop into both eyes at bedtime. For dry eyes    . traMADol (ULTRAM) 50 MG tablet Take 100 mg by mouth every 6 (six) hours as needed (MIGRAINES).    [name] venlafaxine (EFFEXOR) 100 MG tablet Take 100-200 mg by mouth See admin instructions. Take 2 tablets by mouth in the am 1 tablet by mouth in the pm    . ZETIA 10 MG tablet Take 1 tablet by mouth  daily 90 tablet 3  . Magnesium Oxide 200 MG TABS Take 1 tablet (200 mg total) by mouth daily. (Patient not taking: Reported on 03/19/2016) 30 tablet 1  . Riboflavin 100 MG TABS Take 2 tablets (200 mg total) by mouth daily. (Patient not taking: Reported on 03/19/2016) 60 tablet 1   No facility-administered medications prior to visit.    PAST MEDICAL HISTORY: Past Medical History  Diagnosis Date  . Ischemic cardiomyopathy     status post biventricular ICD placed by DR Edumunds who used to see Dr Melvern Banker here to establish  cardiovascular care.  . Hypertension   . Coronary artery disease   . CHF (congestive heart failure) ([place])   . MVP (mitral valve prolapse)     Antibiotics not required for procedures  . Asthma   . Anxiety   . Depression   . Hiatal hernia   . Cervical dysplasia   . Fibroid   . Function kidney decreased   . History of shingles     2-3 yrs ago last out break "around waist"  . ICD (implantable cardiac defibrillator) in place   . Pacemaker   . Complication of anesthesia     "I wake up during surgeries" (02/14/2013)  . Anginal pain ([place])   . Myocardial infarction ([place])     "I've had 2; the others they were able to catch before completing" (02/14/2013)  . Pneumonia 1950's & 1985  . Type II diabetes mellitus (  HCC)   . Iron deficiency  anemia   . GERD (gastroesophageal reflux disease)   . History of stomach ulcers   . Migraines   . Stroke [facility])     "2 confirmed; 9 TIA's; results in dragging LLE; numbness in tip of tongue" (02/14/2013),10-06-15 right hand tends to be weaker when tired.  . Shortness of breath     "lying down flat; at times w/exertion" (02/14/2013). 10-06-15 exertion only..  . Chronic kidney disease     Dr. Posey Pronto follows.  . Back pain     "pinched nerve-lower back" - Dr. Nelva Bush follows.  . Hepatitis     Hepatitis A -college yrs"water source exposure"  . AICD (automatic cardioverter/defibrillator) present     [employer]- Dr. Beckie Salts follows    PAST SURGICAL HISTORY: Past Surgical History  Procedure Laterality Date  . Insert / replace / remove pacemaker      biventricular defibrillator--06/10/ 2009  . Abdominal hysterectomy  1985    TAH   . Cardiac defibrillator placement    . Coronary angioplasty with stent placement      "started out w/5; bypass corrected some; 1 stent since the bypass" (02/14/2013)  . Breast excisional biopsy Left 01/2007; 06/2007; 03/2008    "benign" (02/14/2013)  . Cardiac catheterization      "probably in the teens" (02/14/2013)  . Colonoscopy  ~ 2002  . Tee without cardioversion  11/14/2012    Procedure: TRANSESOPHAGEAL ECHOCARDIOGRAM (TEE);  Surgeon: Candee Furbish, MD;  Location: [facility] ENDOSCOPY;  Service: Cardiovascular;  Laterality: N/A;  . Supraventricular tachycardia ablation  06/2007  . Biv icd genertaor change out  06/2007; 04/2008    "2 lead initial placement, at [facility]; done at [facility], after developing CHF" (02/14/2013)  . Coronary artery bypass graft  ` 1998    LIMA-LAD, SVG-D1, SVG-PDA  . Breast surgery    . Dilation and curettage of uterus  1975 X 2; 1976; 1977  . Left heart catheterization with coronary/graft angiogram N/A 01/03/2015    Procedure: LEFT HEART CATHETERIZATION WITH Beatrix Fetters;  Surgeon: Sinclair Grooms, MD;  Location: [facility] CATH LAB;  Service: Cardiovascular;   Laterality: N/A;  . Biv pacemaker generator change out N/A 01/29/2015    Procedure: BIV PACEMAKER GENERATOR CHANGE OUT;  Surgeon: Evans Lance, MD;  Location[facility] CATH LAB;  Service: Cardiovascular;  Laterality: N/A;  . Colonoscopy with propofol N/A 10/07/2015    Procedure: COLONOSCOPY WITH PROPOFOL;  Surgeon: Juanita Craver, MD;  Location: [facility] ENDOSCOPY;  Service: Endoscopy;  Laterality: N/A;    FAMILY HISTORY: Family History  Problem Relation Age of Onset  . Heart disease Mother   . Hypertension Mother   . Heart attack Mother   . Stroke Mother   . Heart disease Father   . Hypertension Father   . Diabetes Father   . Kidney failure Father   . Heart attack Father   . Stroke Father   . Heart disease Brother   . Diabetes Brother   . Kidney failure Brother   . Heart attack Brother   . Diabetes Paternal Grandmother     SOCIAL HISTORY: Social History   Social History  . Marital Status: Married    Spouse Name: [name]  . Number of Children: 1  . Years of Education: MA   Occupational History  .      Disablity   Social History Main Topics  . Smoking status: Never Smoker   . Smokeless tobacco: Never Used  . Alcohol  Use: No  . Drug Use: No  . Sexual Activity: Yes    Birth Control/ Protection: Surgical   Other Topics Concern  . Not on file   Social History Narrative   Patient lives at home with spouse.     Daughter name is Tourist information centre manager.   Caffeine Use: none     PHYSICAL EXAM  Filed Vitals:   03/19/16 0922  BP: 135/74  Pulse: 67  Height: 5' 4.5" (1.638 m)  Weight: 173 lb (78.472 kg)   Body mass index is 29.25 kg/(m^2). Generalized: ,middle aged african american pleasant AA female.  Neck: Supple, no carotid bruits  Cardiac: Regular rate rhythm, no murmur  Pulmonary: Clear to auscultation bilaterally  Musculoskeletal: No deformity   Neurological examination  Mentation: Alert oriented to time, place, history taking, mild word hesitancy Cranial nerve II-XII:  Pupils were equal round reactive to light extraocular movements were full, visual field were full on confrontational test. facial sensation and strength were normal. hearing was intact to finger rubbing bilaterally. Uvula tongue midline. head turning and shoulder shrug and were normal and symmetric.Tongue protrusion into cheek strength was normal.  MOTOR: normal bulk and tone, full strength in the BUE, BLE, fine finger movements normal, no pronator drift  SENSORY: normal and symmetric to light touch, pinprick, temperature, vibration in the upper and lower extremities COORDINATION: finger-nose-finger, heel-to-shin bilaterally, there was no truncal ataxia  REFLEXES: Symmetric upper and lower, plantar responses were flexor bilaterally.  GAIT/STATION: Rising up from seated position without assistance, normal stance, without trunk ataxia, moderate stride, good arm swing, smooth turning, able to perform tiptoe, and heel walking without difficulty. No difficulty with  tandem walking  DIAGNOSTIC DATA (LABS, IMAGING, TESTING) - I reviewed patient records, labs, notes, testing and imaging myself where available.  Lab Results  Component Value Date   WBC 6.5 01/01/2016   HGB 13.9 01/01/2016   HCT 41.0 01/01/2016   MCV 86.3 01/01/2016   PLT 234 01/01/2016      Component Value Date/Time   NA 144 01/04/2016 0537   K 4.3 01/04/2016 0537   CL 105 01/04/2016 0537   CO2 30 01/04/2016 0537   GLUCOSE 71 01/04/2016 0537   BUN 14 01/04/2016 0537   CREATININE 1.69* 01/04/2016 0537   CALCIUM 8.8* 01/04/2016 0537   PROT 6.9 01/01/2016 1608   ALBUMIN 3.7 01/01/2016 1608   AST 30 01/01/2016 1608   ALT 26 01/01/2016 1608   ALKPHOS 73 01/01/2016 1608   BILITOT 0.5 01/01/2016 1608   GFRNONAA 31* 01/04/2016 0537   GFRAA 36* 01/04/2016 0537   Lab Results  Component Value Date   CHOL 133 01/02/2016   HDL 41 01/02/2016   LDLCALC 66 01/02/2016   TRIG 131 01/02/2016   CHOLHDL 3.2 01/02/2016   Lab  Results  Component Value Date   HGBA1C 6.9* 01/02/2016    ASSESSMENT AND PLAN 65 y.o. AA female with likely stroke on 11/11/12. Head CT normal, MRI unable due to AICD. CTA shows a 25% left ICA stenosis. No other significant abnormalities noted. Patient with multiple vascular risk factors. Stroke Risk Factors - PSVT diabetes mellitus, hypertension,  CAD s/p CABG. She has had no recurrent TIA or stroke symptoms.  PLAN:Stressed the importance of management of risk factors to prevent further stroke Continue Plavix and aspirinfor secondary stroke prevention Maintain strict control of hypertension with blood pressure goal below 130/90, today's reading 134/70  Continue antihypertensive medications Control of diabetes with hemoglobin A1c below 6.5 followed by primary  care most recent hemoglobin A1c6.9 continue diabetic medications Cholesterol with LDL cholesterol less than 70, followed by primary care,  most recent  68 continue statin drugs Exercise by walking, slowly increase , eat healthy diet with whole grains fresh fruits and vegetables Follow-up yearly and when necessary Dennie Bible, [facility], [facility], APRN  [facility] [address] [place], [place] [address] [phone]

## 2016-03-19 NOTE — Patient Instructions (Addendum)
Stressed the importance of management of risk factors to prevent further stroke Continue Plavix and aspirinfor secondary stroke prevention Maintain strict control of hypertension with blood pressure goal below 130/90, today's reading 134/70  Continue antihypertensive medications Control of diabetes with hemoglobin A1c below 6.5 followed by primary care most recent hemoglobin A1c6.9 continue diabetic medications Cholesterol with LDL cholesterol less than 70, followed by primary care,  most recent  68 continue statin drugs Exercise by walking, slowly increase , eat healthy diet with whole grains fresh fruits and vegetables Follow-up yearly and when necessary

## 2016-03-19 NOTE — Progress Notes (Signed)
I agree with the above plan 

## 2016-03-22 ENCOUNTER — Ambulatory Visit (HOSPITAL_COMMUNITY)
Admission: RE | Admit: 2016-03-22 | Discharge: 2016-03-22 | Disposition: A | Discharge status: Home / Self Care | Payer: Medicare Other | Source: Ambulatory Visit | Attending: Internal Medicine | Admitting: Internal Medicine

## 2016-03-22 ENCOUNTER — Encounter (HOSPITAL_COMMUNITY): Payer: Self-pay | Admitting: Internal Medicine

## 2016-03-22 VITALS — BP 138/82 | HR 70 | Wt 174.0 lb

## 2016-03-22 DIAGNOSIS — Z888 Allergy status to other drugs, medicaments and biological substances status: Secondary | ICD-10-CM | POA: Insufficient documentation

## 2016-03-22 DIAGNOSIS — I252 Old myocardial infarction: Secondary | ICD-10-CM | POA: Insufficient documentation

## 2016-03-22 DIAGNOSIS — Z833 Family history of diabetes mellitus: Secondary | ICD-10-CM | POA: Diagnosis not present

## 2016-03-22 DIAGNOSIS — I5022 Chronic systolic (congestive) heart failure: Secondary | ICD-10-CM | POA: Insufficient documentation

## 2016-03-22 DIAGNOSIS — Z951 Presence of aortocoronary bypass graft: Secondary | ICD-10-CM | POA: Diagnosis not present

## 2016-03-22 DIAGNOSIS — Z794 Long term (current) use of insulin: Secondary | ICD-10-CM | POA: Insufficient documentation

## 2016-03-22 DIAGNOSIS — Z880 Allergy status to penicillin: Secondary | ICD-10-CM | POA: Diagnosis not present

## 2016-03-22 DIAGNOSIS — I255 Ischemic cardiomyopathy: Secondary | ICD-10-CM

## 2016-03-22 DIAGNOSIS — I130 Hypertensive heart and chronic kidney disease with heart failure and stage 1 through stage 4 chronic kidney disease, or unspecified chronic kidney disease: Secondary | ICD-10-CM | POA: Insufficient documentation

## 2016-03-22 DIAGNOSIS — F329 Major depressive disorder, single episode, unspecified: Secondary | ICD-10-CM | POA: Insufficient documentation

## 2016-03-22 DIAGNOSIS — Z7901 Long term (current) use of anticoagulants: Secondary | ICD-10-CM | POA: Insufficient documentation

## 2016-03-22 DIAGNOSIS — Z8673 Personal history of transient ischemic attack (TIA), and cerebral infarction without residual deficits: Secondary | ICD-10-CM | POA: Insufficient documentation

## 2016-03-22 DIAGNOSIS — I5032 Chronic diastolic (congestive) heart failure: Secondary | ICD-10-CM | POA: Diagnosis not present

## 2016-03-22 DIAGNOSIS — Z8719 Personal history of other diseases of the digestive system: Secondary | ICD-10-CM | POA: Insufficient documentation

## 2016-03-22 DIAGNOSIS — F419 Anxiety disorder, unspecified: Secondary | ICD-10-CM | POA: Diagnosis not present

## 2016-03-22 DIAGNOSIS — N183 Chronic kidney disease, stage 3 unspecified: Secondary | ICD-10-CM

## 2016-03-22 DIAGNOSIS — Z882 Allergy status to sulfonamides status: Secondary | ICD-10-CM | POA: Diagnosis not present

## 2016-03-22 DIAGNOSIS — Z8249 Family history of ischemic heart disease and other diseases of the circulatory system: Secondary | ICD-10-CM | POA: Insufficient documentation

## 2016-03-22 DIAGNOSIS — Z885 Allergy status to narcotic agent status: Secondary | ICD-10-CM | POA: Diagnosis not present

## 2016-03-22 DIAGNOSIS — I341 Nonrheumatic mitral (valve) prolapse: Secondary | ICD-10-CM | POA: Diagnosis not present

## 2016-03-22 DIAGNOSIS — N189 Chronic kidney disease, unspecified: Secondary | ICD-10-CM | POA: Diagnosis not present

## 2016-03-22 DIAGNOSIS — Z95810 Presence of automatic (implantable) cardiac defibrillator: Secondary | ICD-10-CM | POA: Diagnosis not present

## 2016-03-22 DIAGNOSIS — Z823 Family history of stroke: Secondary | ICD-10-CM | POA: Diagnosis not present

## 2016-03-22 DIAGNOSIS — J45909 Unspecified asthma, uncomplicated: Secondary | ICD-10-CM | POA: Insufficient documentation

## 2016-03-22 DIAGNOSIS — I2510 Atherosclerotic heart disease of native coronary artery without angina pectoris: Secondary | ICD-10-CM | POA: Diagnosis not present

## 2016-03-22 DIAGNOSIS — K219 Gastro-esophageal reflux disease without esophagitis: Secondary | ICD-10-CM | POA: Diagnosis not present

## 2016-03-22 DIAGNOSIS — E1122 Type 2 diabetes mellitus with diabetic chronic kidney disease: Secondary | ICD-10-CM | POA: Insufficient documentation

## 2016-03-22 DIAGNOSIS — Z7982 Long term (current) use of aspirin: Secondary | ICD-10-CM | POA: Insufficient documentation

## 2016-03-22 DIAGNOSIS — Z79899 Other long term (current) drug therapy: Secondary | ICD-10-CM | POA: Insufficient documentation

## 2016-03-22 DIAGNOSIS — Z841 Family history of disorders of kidney and ureter: Secondary | ICD-10-CM | POA: Insufficient documentation

## 2016-03-22 DIAGNOSIS — Z881 Allergy status to other antibiotic agents status: Secondary | ICD-10-CM | POA: Diagnosis not present

## 2016-03-22 HISTORY — DX: Chronic diastolic (congestive) heart failure: I50.32

## 2016-03-22 MED ORDER — FUROSEMIDE 80 MG PO TABS: ORAL_TABLET | ORAL | Status: DC

## 2016-03-22 NOTE — Progress Notes (Signed)
Advanced Heart Failure Medication Review by a Pharmacist  Does the patient  feel that his/her medications are working for him/her?  yes  Has the patient been experiencing any side effects to the medications prescribed?  occasional dizziness, feels sedated after taking meds & requires napping   Does the patient measure his/her own blood pressure or blood glucose at home?  yes   Does the patient have any problems obtaining medications due to transportation or finances?   no  Understanding of regimen: excellent Understanding of indications: excellent Potential of compliance: excellent Patient understands to avoid NSAIDs. Patient understands to avoid decongestants.  Issues to address at subsequent visits: none   Pharmacist comments: [name] is a 65 yo woman here for HF clinic visit. On long-term DAPT for hx CVA/TIA with no bleeding or bruising. Weight remains stable on furosemide. Good adherence to medications, has typed and laminated card with all medications. No issues or concerns to discuss with me at this time.  Of note, requested all new meds be [facility] [place] [facility] [address] and all refills to [facility].   [name], [place].D., BCPS PGY2 Cardiology Pharmacy Resident Pager: [phone]    Time with patient: 10 min Preparation and documentation time: 5 min Total time: 15 min

## 2016-03-22 NOTE — Patient Instructions (Addendum)
You may take an additional 80mg  of lasix daily as needed for weight gain.  Your provider requests you have a Cardiopulmonary Exercise test.  Follow up with Dr.Bensimhon in 3 months

## 2016-03-22 NOTE — Progress Notes (Signed)
Patient ID: [name], female   DOB: February 06, 1951, 65 y.o.   MRN: [id number]    Advanced Heart Failure Clinic Note   Referring Physician: Self PCP: Reginia Naas, MD Cardiologist: Sinclair Grooms, MD   HPI:  [name] is a 65 y.o. female with medical history of CAD s/p CABG 1998, CHF, ischemic cardiomyopathy s/p Medtronic CRT-D upgrade in 02/2013, mitral valve disease with moderate regurgitation, diabetes mellitus, and ischemic stroke history.  She is also followed by Dr Tamala Julian.   Last cath 01/03/15 1. Multi-vessel coronary artery disease. The left main coronary artery is patent without any significant obstruction.  The left anterior descending artery is diffusely diseased in the proximal and mid segment. Competitive flow is noted in the mid and distal LAD. First agonal is severely diseased.  The left circumflex artery is patent. 4 obtuse marginal branches are noted. The distal circumflex before the fourth marginal is diffusely disease. Otherwise luminal irregularities are noted throughout..  The right coronary artery is totally occluded in the mid vessel.. 2. Status post coronary artery bypass grafting.  a. Patent LIMA to LAD.  b. Patent saphenous vein graft to diagonal.  c. Patent vein graft to PDA and retrograde filling of  posterolateral branch.  Last seen in [facility] clinic 02/18/16. Was thought to be stable.   She presents today to establish in the HF clinic. Says has good days and bad days. Occasional sharp chest pain. Last seconds and goes away. Can happen at any time. Seems to be a little more frequent. No prolonged CP. Saw Dr. Tamala Julian last month and felt to be ok. Occasional LE swelling. Can do ADLs but gets SOB with more activity. Able to go to store and leans on the buggy.Weight stable. SBP 110-120 range.    Echo 01/02/16 LVEF [id number], Grade 2 Diastolic HF, Mod MR, Mod LAE, Mild TR  Review of Systems: [y] = yes, [  ] = no   General: Weight gain [ ] ; Weight loss [ ] ; Anorexia [ ] ; Fatigue [ y]; Fever [ ] ; Chills [ ] ; Weakness [ ]   Cardiac: Chest pain/pressure Blue.Reese ]; Resting SOB [ ] ; Exertional SOB [ y]; Orthopnea [ ] ; Pedal Edema [ ] ; Palpitations [ ] ; Syncope [ ] ; Presyncope [ ] ; Paroxysmal nocturnal dyspnea[ ]   Pulmonary: Cough [ ] ; Wheezing[ ] ; Hemoptysis[ ] ; Sputum [ ] ; Snoring [ ]   GI: Vomiting[ ] ; Dysphagia[ ] ; Melena[ ] ; Hematochezia [ ] ; Heartburn[ ] ; Abdominal pain [ ] ; Constipation [ ] ; Diarrhea [ ] ; BRBPR [ ]   GU: Hematuria[ ] ; Dysuria [ ] ; Nocturia[ ]   Vascular: Pain in legs with walking [ ] ; Pain in feet with lying flat [ ] ; Non-healing sores [ ] ; Stroke [ ] ; TIA [ ] ; Slurred speech [ ] ;  Neuro: Headaches[ ] ; Vertigo[ ] ; Seizures[ ] ; Paresthesias[ ] ;Blurred vision [ ] ; Diplopia [ ] ; Vision changes [ ]   Ortho/Skin: Arthritis [ y]; Joint pain Blue.Reese ]; Muscle pain [ ] ; Joint swelling [ ] ; Back Pain [ y]; Rash [ ]   Psych: Depression[ ] ; Anxiety[ ]   Heme: Bleeding problems [ ] ; Clotting disorders [ ] ; Anemia [ ]   Endocrine: Diabetes [ ] ; Thyroid dysfunction[ ]    Past Medical History  Diagnosis Date  . Ischemic cardiomyopathy     status post biventricular ICD placed by DR Edumunds who used to see Dr Melvern Banker here to establish  cardiovascular care.  . Hypertension   . Coronary artery disease   . CHF (congestive heart failure) ([place])   .  MVP (mitral valve prolapse)     Antibiotics not required for procedures  . Asthma   . Anxiety   . Depression   . Hiatal hernia   . Cervical dysplasia   . Fibroid   . Function kidney decreased   . History of shingles     2-3 yrs ago last out break "around waist"  . ICD (implantable cardiac defibrillator) in place   . Pacemaker   . Complication of anesthesia     "I wake up during surgeries" (02/14/2013)  . Anginal pain ([place])   . Myocardial infarction ([place])     "I've had 2; the others they were able to catch before completing" (02/14/2013)  . Pneumonia 1950's &  1985  . Type II diabetes mellitus ([place])   . Iron deficiency anemia   . GERD (gastroesophageal reflux disease)   . History of stomach ulcers   . Migraines   . Stroke [facility])     "2 confirmed; 9 TIA's; results in dragging LLE; numbness in tip of tongue" (02/14/2013),10-06-15 right hand tends to be weaker when tired.  . Shortness of breath     "lying down flat; at times w/exertion" (02/14/2013). 10-06-15 exertion only..  . Chronic kidney disease     Dr. Posey Pronto follows.  . Back pain     "pinched nerve-lower back" - Dr. Nelva Bush follows.  . Hepatitis     Hepatitis A -college yrs"water source exposure"  . AICD (automatic cardioverter/defibrillator) present     [employer]- Dr. Beckie Salts follows    Current Outpatient Prescriptions  Medication Sig Dispense Refill  . albuterol (PROVENTIL HFA;VENTOLIN HFA) 108 (90 BASE) MCG/ACT inhaler Inhale 2 puffs into the lungs every 4 (four) hours as needed for wheezing or shortness of breath.    . ALPRAZolam (XANAX) 0.5 MG tablet Take 1-2 tablets by mouth 3 (three) times daily. 2 tabs in the AM, 1 at noon, 1 at night    . Artificial Tear GEL Apply 2 drops to eye daily as needed (dry eyes). \    . aspirin 325 MG EC tablet Take 325 mg by mouth daily.      . cetirizine (ZYRTEC) 10 MG tablet Take 10 mg by mouth daily.    . Cholecalciferol (VITAMIN D-3) 1000 UNITS CAPS Take 1 capsule by mouth daily.     . clopidogrel (PLAVIX) 75 MG tablet Take 1 tablet by mouth  daily 90 tablet 3  . doxazosin (CARDURA) 4 MG tablet Take 4 mg by mouth at bedtime.     . fluticasone (FLONASE) 50 MCG/ACT nasal spray Place 2 sprays into the nose daily as needed for allergies or rhinitis.     . furosemide (LASIX) 80 MG tablet Take 1 tablet by mouth two  times daily 180 tablet 3  . Insulin Glargine (BASAGLAR KWIKPEN) 100 UNIT/ML SOPN Inject 18 Units into the skin at bedtime. Reported on 02/18/2016    . isosorbide mononitrate (IMDUR) 30 MG 24 hr tablet Take 1 tablet by mouth  daily 90 tablet 1  .  metoprolol (LOPRESSOR) 100 MG tablet Take 1 tablet by mouth two  times daily 180 tablet 3  . Multiple Vitamin (MULTIVITAMIN WITH MINERALS) TABS Take 1 tablet by mouth daily.    . pantoprazole (PROTONIX) 40 MG tablet Take 40 mg by mouth daily.    . potassium chloride (K-DUR) 10 MEQ tablet Take 2 tablets (20 mEq total) by mouth 2 (two) times daily. 360 tablet 1  . ramipril (ALTACE) 10 MG capsule Take  1 capsule by mouth  twice daily 180 capsule 3  . simvastatin (ZOCOR) 20 MG tablet Take 1 tablet by mouth at  bedtime 90 tablet 2  . sodium chloride (MURO 128) 5 % ophthalmic ointment Place 1 drop into both eyes at bedtime. For dry eyes    . traMADol (ULTRAM) 50 MG tablet Take 100 mg by mouth every 6 (six) hours as needed (MIGRAINES).    [name] venlafaxine (EFFEXOR) 100 MG tablet Take 100-200 mg by mouth See admin instructions. Take 2 tablets by mouth in the am 1 tablet by mouth in the pm    . ZETIA 10 MG tablet Take 1 tablet by mouth  daily 90 tablet 3  . cyclobenzaprine (FLEXERIL) 5 MG tablet Take 1 tablet (5 mg total) by mouth at bedtime as needed for muscle spasms. 30 tablet 0  . NITROSTAT 0.4 MG SL tablet Place 1 tablet under the tongue every 5 minutes as needed for chest pain, max 3 doses, go to er if no relief 25 tablet 2  . ondansetron (ZOFRAN ODT) 4 MG disintegrating tablet Take 1 tablet (4 mg total) by mouth every 8 (eight) hours as needed for nausea or vomiting. 12 tablet 0  . Riboflavin 100 MG TABS Take 2 tablets (200 mg total) by mouth daily. (Patient not taking: Reported on 03/19/2016) 60 tablet 1   No current facility-administered medications for this encounter.    Allergies  Allergen Reactions  . Calcium Channel Blockers Other (See Comments)    Cannot take non-DHP CCBs (diltiazem, verapamil) due to CHF requiring ICD  . Codeine Anaphylaxis  . Ticlid [Ticlopidine Hcl] Other (See Comments)    Unprovoked bleeding while on Ticlid (doesn't think she was on ASA at the time) Takes Plavix without  problems  . Morphine And Related Other (See Comments)    Severe AMS, confusion, weakness Tolerates Fentanyl, Tramadol  . Tape Dermatitis and Rash    Tolerates paper tape  . Digoxin And Related Nausea Only    Unknown  . Metolazone Other (See Comments)    Cannot remember reaction  . Myrbetriq [Mirabegron] Other (See Comments)    Aggravates migraines  . Onglyza [Saxagliptin] Other (See Comments)    Exacerbates migraines  . Penicillins Hives    Has patient had a PCN reaction causing immediate rash, facial/tongue/throat swelling, SOB or lightheadedness with hypotension: No Has patient had a PCN reaction causing severe rash involving mucus membranes or skin necrosis: No Has patient had a PCN reaction that required hospitalization No Has patient had a PCN reaction occurring within the last 10 years: No If all of the above answers are "NO", then may proceed with Cephalosporin use.   [name] Spironolactone Other (See Comments)    Cannot remember reaction  . Sulfa Antibiotics Hives  . Tetracyclines & Related Other (See Comments)    Cannot remember reaction Tolerates macrolides (azithromycin)  . Latex Itching and Rash      Social History   Social History  . Marital Status: Married    Spouse Name: [name]  . Number of Children: 1  . Years of Education: MA   Occupational History  .      Disablity   Social History Main Topics  . Smoking status: Never Smoker   . Smokeless tobacco: Never Used  . Alcohol Use: No  . Drug Use: No  . Sexual Activity: Yes    Birth Control/ Protection: Surgical   Other Topics Concern  . Not on file   Social History  Narrative   Patient lives at home with spouse.     Daughter name is Tourist information centre manager.   Caffeine Use: none      Family History  Problem Relation Age of Onset  . Heart disease Mother   . Hypertension Mother   . Heart attack Mother   . Stroke Mother   . Heart disease Father   . Hypertension Father   . Diabetes Father   . Kidney failure Father    . Heart attack Father   . Stroke Father   . Heart disease Brother   . Diabetes Brother   . Kidney failure Brother   . Heart attack Brother   . Diabetes Paternal Grandmother     Filed Vitals:   03/22/16 1400  BP: 138/82  Pulse: 70  Weight: 174 lb (78.926 kg)  SpO2: 100%    PHYSICAL EXAM: General:  Well appearing. No respiratory difficulty HEENT: normal Neck: supple. no JVD. Carotids 2+ bilat; no bruits. No lymphadenopathy or thryomegaly appreciated. Cor: PMI nondisplaced. Regular rate & rhythm. No rubs, gallops or murmurs. Lungs: clear Abdomen: soft, nontender, nondistended. No hepatosplenomegaly. No bruits or masses. Good bowel sounds. Extremities: no cyanosis, clubbing, rash, edema Neuro: alert & oriented x 3, cranial nerves grossly intact. moves all 4 extremities w/o difficulty. Affect pleasant.   ASSESSMENT & PLAN:  1. Chronic systolic HF 2/2 ICM. EF 45-50% by echo 2/17. S/p MDT CRT-D --EF has improved markedly. --However remains NYHA III by report. Volume status ok --On good meds. Is on short-acting lopressor but given normalization of EF will not change at this point --Ideally would change ramipril to [facility] but EF doesn't qualify.  --With discrepancy between echo findings and symptoms, I feel CPX is need to objectively evaluate funational capacity 2. CAD s/p CABG -- No clear angina -- Cath from 2016 reviewed with stable revascularization. Followed by Dr. Tamala Julian  3. HTN -- well controlled 4. CKD, stage 3 (creatinine 1.7)  --stable   Overall seems to be doing very well from HF perspective but still with a lot of symptoms. Will check CPX.   Claudy Abdallah,MD 3:04 PM

## 2016-03-23 ENCOUNTER — Telehealth (HOSPITAL_COMMUNITY): Payer: Self-pay

## 2016-03-23 NOTE — Telephone Encounter (Signed)
error 

## 2016-03-30 ENCOUNTER — Ambulatory Visit (HOSPITAL_COMMUNITY): Payer: Medicare Other | Attending: Cardiology

## 2016-03-30 DIAGNOSIS — I5022 Chronic systolic (congestive) heart failure: Secondary | ICD-10-CM | POA: Diagnosis not present

## 2016-03-30 DIAGNOSIS — Z95810 Presence of automatic (implantable) cardiac defibrillator: Secondary | ICD-10-CM | POA: Diagnosis not present

## 2016-03-30 DIAGNOSIS — Z955 Presence of coronary angioplasty implant and graft: Secondary | ICD-10-CM | POA: Diagnosis not present

## 2016-03-30 DIAGNOSIS — I130 Hypertensive heart and chronic kidney disease with heart failure and stage 1 through stage 4 chronic kidney disease, or unspecified chronic kidney disease: Secondary | ICD-10-CM | POA: Diagnosis not present

## 2016-03-30 DIAGNOSIS — I255 Ischemic cardiomyopathy: Secondary | ICD-10-CM | POA: Insufficient documentation

## 2016-03-30 DIAGNOSIS — N183 Chronic kidney disease, stage 3 (moderate): Secondary | ICD-10-CM | POA: Insufficient documentation

## 2016-03-30 DIAGNOSIS — I2510 Atherosclerotic heart disease of native coronary artery without angina pectoris: Secondary | ICD-10-CM | POA: Insufficient documentation

## 2016-03-31 DIAGNOSIS — I5022 Chronic systolic (congestive) heart failure: Secondary | ICD-10-CM | POA: Diagnosis not present

## 2016-03-31 DIAGNOSIS — I509 Heart failure, unspecified: Secondary | ICD-10-CM

## 2016-04-01 ENCOUNTER — Encounter: Payer: Self-pay | Admitting: Internal Medicine

## 2016-04-02 DIAGNOSIS — N189 Chronic kidney disease, unspecified: Secondary | ICD-10-CM | POA: Diagnosis not present

## 2016-04-02 DIAGNOSIS — N183 Chronic kidney disease, stage 3 (moderate): Secondary | ICD-10-CM | POA: Diagnosis not present

## 2016-04-02 DIAGNOSIS — N2581 Secondary hyperparathyroidism of renal origin: Secondary | ICD-10-CM | POA: Diagnosis not present

## 2016-04-08 DIAGNOSIS — N183 Chronic kidney disease, stage 3 (moderate): Secondary | ICD-10-CM | POA: Diagnosis not present

## 2016-04-08 DIAGNOSIS — D631 Anemia in chronic kidney disease: Secondary | ICD-10-CM | POA: Diagnosis not present

## 2016-04-08 DIAGNOSIS — I129 Hypertensive chronic kidney disease with stage 1 through stage 4 chronic kidney disease, or unspecified chronic kidney disease: Secondary | ICD-10-CM | POA: Diagnosis not present

## 2016-04-08 DIAGNOSIS — N2581 Secondary hyperparathyroidism of renal origin: Secondary | ICD-10-CM | POA: Diagnosis not present

## 2016-04-20 DIAGNOSIS — F332 Major depressive disorder, recurrent severe without psychotic features: Secondary | ICD-10-CM | POA: Diagnosis not present

## 2016-04-20 DIAGNOSIS — F411 Generalized anxiety disorder: Secondary | ICD-10-CM | POA: Diagnosis not present

## 2016-05-10 DIAGNOSIS — I2510 Atherosclerotic heart disease of native coronary artery without angina pectoris: Secondary | ICD-10-CM | POA: Diagnosis not present

## 2016-05-10 DIAGNOSIS — I679 Cerebrovascular disease, unspecified: Secondary | ICD-10-CM | POA: Diagnosis not present

## 2016-05-10 DIAGNOSIS — E1151 Type 2 diabetes mellitus with diabetic peripheral angiopathy without gangrene: Secondary | ICD-10-CM | POA: Diagnosis not present

## 2016-05-10 DIAGNOSIS — I10 Essential (primary) hypertension: Secondary | ICD-10-CM | POA: Diagnosis not present

## 2016-05-10 DIAGNOSIS — E1165 Type 2 diabetes mellitus with hyperglycemia: Secondary | ICD-10-CM | POA: Diagnosis not present

## 2016-05-10 DIAGNOSIS — I5022 Chronic systolic (congestive) heart failure: Secondary | ICD-10-CM | POA: Diagnosis not present

## 2016-05-10 DIAGNOSIS — N183 Chronic kidney disease, stage 3 (moderate): Secondary | ICD-10-CM | POA: Diagnosis not present

## 2016-05-10 DIAGNOSIS — E1121 Type 2 diabetes mellitus with diabetic nephropathy: Secondary | ICD-10-CM | POA: Diagnosis not present

## 2016-05-10 DIAGNOSIS — E1142 Type 2 diabetes mellitus with diabetic polyneuropathy: Secondary | ICD-10-CM | POA: Diagnosis not present

## 2016-05-13 ENCOUNTER — Encounter: Payer: Self-pay | Admitting: Internal Medicine

## 2016-05-13 ENCOUNTER — Ambulatory Visit (INDEPENDENT_AMBULATORY_CARE_PROVIDER_SITE_OTHER): Payer: Medicare Other | Admitting: Internal Medicine

## 2016-05-13 VITALS — BP 126/84 | HR 72 | Ht 64.5 in | Wt 173.8 lb

## 2016-05-13 DIAGNOSIS — I255 Ischemic cardiomyopathy: Secondary | ICD-10-CM

## 2016-05-13 DIAGNOSIS — I5022 Chronic systolic (congestive) heart failure: Secondary | ICD-10-CM

## 2016-05-13 LAB — CUP PACEART INCLINIC DEVICE CHECK
Battery Voltage: 3.01 V
Brady Statistic AS VP Percent: 98.5 %
Brady Statistic RA Percent Paced: 0.11 %
Brady Statistic RV Percent Paced: 0.55 %
Date Time Interrogation Session: 20170706170900
HighPow Impedance: 37 Ohm
HighPow Impedance: 47 Ohm
Implantable Lead Implant Date: 20090610
Implantable Lead Location: 753859
Implantable Lead Model: 4194
Implantable Lead Model: 6947
Lead Channel Impedance Value: 285 Ohm
Lead Channel Impedance Value: 361 Ohm
Lead Channel Impedance Value: 456 Ohm
Lead Channel Impedance Value: 646 Ohm
Lead Channel Pacing Threshold Amplitude: 0.625 V
Lead Channel Pacing Threshold Pulse Width: 0.4 ms
Lead Channel Pacing Threshold Pulse Width: 0.4 ms
Lead Channel Sensing Intrinsic Amplitude: 2.3 mV
Lead Channel Setting Pacing Amplitude: 2 V
MDC IDC LEAD IMPLANT DT: 20090610
MDC IDC LEAD IMPLANT DT: 20090610
MDC IDC LEAD LOCATION: 753858
MDC IDC LEAD LOCATION: 753860
MDC IDC MSMT BATTERY REMAINING LONGEVITY: 107 mo
MDC IDC MSMT LEADCHNL LV IMPEDANCE VALUE: 475 Ohm
MDC IDC MSMT LEADCHNL LV PACING THRESHOLD AMPLITUDE: 0.875 V
MDC IDC MSMT LEADCHNL RA PACING THRESHOLD PULSEWIDTH: 0.4 ms
MDC IDC MSMT LEADCHNL RV IMPEDANCE VALUE: 475 Ohm
MDC IDC MSMT LEADCHNL RV PACING THRESHOLD AMPLITUDE: 0.875 V
MDC IDC MSMT LEADCHNL RV SENSING INTR AMPL: 5.3 mV
MDC IDC SET LEADCHNL LV PACING PULSEWIDTH: 0.4 ms
MDC IDC SET LEADCHNL RA PACING AMPLITUDE: 1.5 V
MDC IDC SET LEADCHNL RV SENSING SENSITIVITY: 0.3 mV
MDC IDC STAT BRADY AP VP PERCENT: 0.1 %
MDC IDC STAT BRADY AP VS PERCENT: 0.01 %
MDC IDC STAT BRADY AS VS PERCENT: 1.39 %

## 2016-05-13 NOTE — Patient Instructions (Signed)
Medication Instructions:  Your physician recommends that you continue on your current medications as directed. Please refer to the Current Medication list given to you today.   Labwork: None ordered   Testing/Procedures: None ordered   Follow-Up: Your physician wants you to follow-up in: 12 months with Dr. Lovena Le. You will receive a reminder letter in the mail two months in advance. If you don't receive a letter, please call our office to schedule the follow-up appointment.  Remote monitoring is used to monitor your ICD from home. This monitoring reduces the number of office visits required to check your device to one time per year. It allows [place] to keep an eye on the functioning of your device to ensure it is working properly. You are scheduled for a device check from home on 08/12/16. You may send your transmission at any time that day. If you have a wireless device, the transmission will be sent automatically. After your physician reviews your transmission, you will receive a postcard with your next transmission date.    Any Other Special Instructions Will Be Listed Below (If Applicable).     If you need a refill on your cardiac medications before your next appointment, please call your pharmacy.

## 2016-05-13 NOTE — Progress Notes (Signed)
HPI [name] returns today for followup. She is a very pleasant 65 year old woman with a history of premature coronary disease, and ischemic cardiomyopathy, chronic systolic heart failure, status post ICD implantation. In the interim, the patient admits to being sedentary. She is not exercising. She denies anginal symptoms and her dyspnea is well controlled. She does have some arthritic symptoms.  Allergies  Allergen Reactions  . Calcium Channel Blockers Other (See Comments)    Cannot take non-DHP CCBs (diltiazem, verapamil) due to CHF requiring ICD  . Codeine Anaphylaxis  . Ticlid [Ticlopidine Hcl] Other (See Comments)    Unprovoked bleeding while on Ticlid (doesn't think she was on ASA at the time) Takes Plavix without problems  . Morphine And Related Other (See Comments)    Severe AMS, confusion, weakness Tolerates Fentanyl, Tramadol  . Tape Dermatitis and Rash    Tolerates paper tape  . Digoxin And Related Nausea Only    Unknown  . Metolazone Other (See Comments)    Cannot remember reaction  . Myrbetriq [Mirabegron] Other (See Comments)    Aggravates migraines  . Onglyza [Saxagliptin] Other (See Comments)    Exacerbates migraines  . Penicillins Hives    Has patient had a PCN reaction causing immediate rash, facial/tongue/throat swelling, SOB or lightheadedness with hypotension: No Has patient had a PCN reaction causing severe rash involving mucus membranes or skin necrosis: No Has patient had a PCN reaction that required hospitalization No Has patient had a PCN reaction occurring within the last 10 years: No If all of the above answers are "NO", then may proceed with Cephalosporin use.   [name] Spironolactone Other (See Comments)    Cannot remember reaction  . Sulfa Antibiotics Hives  . Tetracyclines & Related Other (See Comments)    Cannot remember reaction Tolerates macrolides (azithromycin)  . Latex Itching and Rash     Current Outpatient Prescriptions    Medication Sig Dispense Refill  . albuterol (PROVENTIL HFA;VENTOLIN HFA) 108 (90 BASE) MCG/ACT inhaler Inhale 2 puffs into the lungs every 4 (four) hours as needed for wheezing or shortness of breath.    . ALPRAZolam (XANAX) 0.5 MG tablet Take 1-2 tablets by mouth 3 (three) times daily. 2 tabs in the AM, 1 at noon, 1 at night    . Artificial Tear GEL Apply 2 drops to eye daily as needed (dry eyes). \    . aspirin 325 MG EC tablet Take 325 mg by mouth daily.      . cetirizine (ZYRTEC) 10 MG tablet Take 10 mg by mouth daily.    . Cholecalciferol (VITAMIN D-3) 1000 UNITS CAPS Take 1 capsule by mouth daily.     . clopidogrel (PLAVIX) 75 MG tablet Take 1 tablet by mouth  daily 90 tablet 3  . cyclobenzaprine (FLEXERIL) 5 MG tablet Take 1 tablet (5 mg total) by mouth at bedtime as needed for muscle spasms. 30 tablet 0  . doxazosin (CARDURA) 4 MG tablet Take 4 mg by mouth at bedtime.     . fluticasone (FLONASE) 50 MCG/ACT nasal spray Place 2 sprays into the nose daily as needed for allergies or rhinitis.     . furosemide (LASIX) 80 MG tablet Take 1 tablet by mouth two  times daily. May take an additional 80mg  daily as needed for edema. 180 tablet 3  . Insulin Glargine (BASAGLAR KWIKPEN) 100 UNIT/ML SOPN Inject 18 Units into the skin at bedtime. Reported on 02/18/2016    . isosorbide mononitrate (IMDUR)  30 MG 24 hr tablet Take 1 tablet by mouth  daily 90 tablet 1  . metoprolol (LOPRESSOR) 100 MG tablet Take 1 tablet by mouth two  times daily 180 tablet 3  . Multiple Vitamin (MULTIVITAMIN WITH MINERALS) TABS Take 1 tablet by mouth daily.    [name] NITROSTAT 0.4 MG SL tablet Place 1 tablet under the tongue every 5 minutes as needed for chest pain, max 3 doses, go to er if no relief 25 tablet 2  . ondansetron (ZOFRAN ODT) 4 MG disintegrating tablet Take 1 tablet (4 mg total) by mouth every 8 (eight) hours as needed for nausea or vomiting. 12 tablet 0  . pantoprazole (PROTONIX) 40 MG tablet Take 40 mg by mouth  daily.    . potassium chloride (K-DUR) 10 MEQ tablet Take 2 tablets (20 mEq total) by mouth 2 (two) times daily. 360 tablet 1  . pregabalin (LYRICA) 50 MG capsule Take 50 mg by mouth at bedtime.    . ramipril (ALTACE) 10 MG capsule Take 1 capsule by mouth  twice daily 180 capsule 3  . Riboflavin 100 MG TABS Take 2 tablets (200 mg total) by mouth daily. 60 tablet 1  . simvastatin (ZOCOR) 20 MG tablet Take 1 tablet by mouth at  bedtime 90 tablet 2  . sodium chloride (MURO 128) 5 % ophthalmic ointment Place 1 drop into both eyes at bedtime as needed. For dry eyes    . traMADol (ULTRAM) 50 MG tablet Take 100 mg by mouth every 6 (six) hours as needed (MIGRAINES).    [name] venlafaxine (EFFEXOR) 100 MG tablet Take 100-200 mg by mouth See admin instructions. Take 2 tablets by mouth in the morning 1 tablet by mouth at noon    . ZETIA 10 MG tablet Take 1 tablet by mouth  daily 90 tablet 3   No current facility-administered medications for this visit.     Past Medical History  Diagnosis Date  . Ischemic cardiomyopathy     status post biventricular ICD placed by DR Edumunds who used to see Dr Melvern Banker here to establish  cardiovascular care.  . Hypertension   . Coronary artery disease   . CHF (congestive heart failure) ([place])   . MVP (mitral valve prolapse)     Antibiotics not required for procedures  . Asthma   . Anxiety   . Depression   . Hiatal hernia   . Cervical dysplasia   . Fibroid   . Function kidney decreased   . History of shingles     2-3 yrs ago last out break "around waist"  . ICD (implantable cardiac defibrillator) in place   . Pacemaker   . Complication of anesthesia     "I wake up during surgeries" (02/14/2013)  . Anginal pain ([place])   . Myocardial infarction ([place])     "I've had 2; the others they were able to catch before completing" (02/14/2013)  . Pneumonia 1950's & 1985  . Type II diabetes mellitus ([place])   . Iron deficiency anemia   . GERD (gastroesophageal reflux disease)   .  History of stomach ulcers   . Migraines   . Stroke [facility])     "2 confirmed; 9 TIA's; results in dragging LLE; numbness in tip of tongue" (02/14/2013),10-06-15 right hand tends to be weaker when tired.  . Shortness of breath     "lying down flat; at times w/exertion" (02/14/2013). 10-06-15 exertion only..  . Chronic kidney disease     Dr. Posey Pronto follows.  [name]  Back pain     "pinched nerve-lower back" - Dr. Nelva Bush follows.  . Hepatitis     Hepatitis A -college yrs"water source exposure"  . AICD (automatic cardioverter/defibrillator) present     [employer]- Dr. Beckie Salts follows    ROS:   All systems reviewed and negative except as noted in the HPI.   Past Surgical History  Procedure Laterality Date  . Insert / replace / remove pacemaker      biventricular defibrillator--06/10/ 2009  . Abdominal hysterectomy  1985    TAH   . Cardiac defibrillator placement    . Coronary angioplasty with stent placement      "started out w/5; bypass corrected some; 1 stent since the bypass" (02/14/2013)  . Breast excisional biopsy Left 01/2007; 06/2007; 03/2008    "benign" (02/14/2013)  . Cardiac catheterization      "probably in the teens" (02/14/2013)  . Colonoscopy  ~ 2002  . Tee without cardioversion  11/14/2012    Procedure: TRANSESOPHAGEAL ECHOCARDIOGRAM (TEE);  Surgeon: Candee Furbish, MD;  Location: [facility] ENDOSCOPY;  Service: Cardiovascular;  Laterality: N/A;  . Supraventricular tachycardia ablation  06/2007  . Biv icd genertaor change out  06/2007; 04/2008    "2 lead initial placement, at [facility]; done at [facility], after developing CHF" (02/14/2013)  . Coronary artery bypass graft  ` 1998    LIMA-LAD, SVG-D1, SVG-PDA  . Breast surgery    . Dilation and curettage of uterus  1975 X 2; 1976; 1977  . Left heart catheterization with coronary/graft angiogram N/A 01/03/2015    Procedure: LEFT HEART CATHETERIZATION WITH Beatrix Fetters;  Surgeon: Sinclair Grooms, MD;  Location[facility] CATH LAB;  Service: Cardiovascular;   Laterality: N/A;  . Biv pacemaker generator change out N/A 01/29/2015    Procedure: BIV PACEMAKER GENERATOR CHANGE OUT;  Surgeon: Evans Lance, MD;  Location: [facility] CATH LAB;  Service: Cardiovascular;  Laterality: N/A;  . Colonoscopy with propofol N/A 10/07/2015    Procedure: COLONOSCOPY WITH PROPOFOL;  Surgeon: Juanita Craver, MD;  Location: [facility] ENDOSCOPY;  Service: Endoscopy;  Laterality: N/A;     Family History  Problem Relation Age of Onset  . Heart disease Mother   . Hypertension Mother   . Heart attack Mother   . Stroke Mother   . Heart disease Father   . Hypertension Father   . Diabetes Father   . Kidney failure Father   . Heart attack Father   . Stroke Father   . Heart disease Brother   . Diabetes Brother   . Kidney failure Brother   . Heart attack Brother   . Diabetes Paternal Grandmother      Social History   Social History  . Marital Status: Married    Spouse Name: [name]  . Number of Children: 1  . Years of Education: MA   Occupational History  .      Disablity   Social History Main Topics  . Smoking status: Never Smoker   . Smokeless tobacco: Never Used  . Alcohol Use: No  . Drug Use: No  . Sexual Activity: Yes    Birth Control/ Protection: Surgical   Other Topics Concern  . Not on file   Social History Narrative   Patient lives at home with spouse.     Daughter name is Tourist information centre manager.   Caffeine Use: none     BP 126/84 mmHg  Pulse 72  Ht 5' 4.5" (1.638 m)  Wt 173 lb 12.8 oz (78.835 kg)  BMI 29.38 kg/m2  Physical Exam:  Well appearing 65 year-old woman,NAD HEENT: Unremarkable Neck:  6 cm JVD, no thyromegally Back:  No CVA tenderness Lungs:  Clear with no wheezes, rales, or rhonchi. Well healed ICD incision.   HEART:  Regular rate rhythm, no murmurs, no rubs, no clicks Abd:  soft, positive bowel sounds, no organomegally, no rebound, no guarding Ext:  2 plus pulses, no edema, no cyanosis, no clubbing Skin:  No rashes no nodules Neuro:  CN II  through XII intact, motor grossly intact  EKG -  Normal sinus rhythm with biventricular pacing  DEVICE  Normal device function.  See PaceArt for details.   Assess/Plan: 1. Chronic systolic heart failure - she appears to be class 2 but admits to being very sedentary. I have encouraged the patient to increase her physical activity. She will continue her current meds and maintain a low sodium diet. 2. CAD - she denies anginal symptoms. She has rare episodes of non-cardiac chest pain. 3. ICD - her medtronic BiV ICD is working normally. Will follow.   Cristopher Peru, M.D.

## 2016-05-16 ENCOUNTER — Other Ambulatory Visit: Payer: Self-pay | Admitting: Interventional Cardiology

## 2016-06-07 ENCOUNTER — Encounter: Payer: Self-pay | Admitting: Gynecology

## 2016-06-07 ENCOUNTER — Ambulatory Visit (INDEPENDENT_AMBULATORY_CARE_PROVIDER_SITE_OTHER): Payer: Medicare Other | Admitting: Gynecology

## 2016-06-07 VITALS — BP 134/82 | Ht 63.0 in | Wt 178.0 lb

## 2016-06-07 DIAGNOSIS — R102 Pelvic and perineal pain: Secondary | ICD-10-CM | POA: Insufficient documentation

## 2016-06-07 DIAGNOSIS — Z01419 Encounter for gynecological examination (general) (routine) without abnormal findings: Secondary | ICD-10-CM

## 2016-06-07 NOTE — Progress Notes (Signed)
[name] [birth year] [id number]   History:    65 y.o.  for annual gyn exam  With complaint of right lower quadrant abdominal pains on and off for the past several days. Patient with no GU or GI complaints.She is no longer using vaginal estrogen twice a week for atrophy since she states that she does not needed since she is not sexually active. Patient with past history of total abdominal hysterectomy.Prior to hysterectomy patient had no history of any high-grade dysplasia.Patient has past history of triple bypass. She also had a left breast biopsy in the past which was benign. She's had cardiac catheterization in 2004 in 2005 respectively. She also had a breast biopsy in 2008 as well. Her last mammogram was reported to be normal 3 dimension as a result of rest being dense in July of 2015. Her PCP is Dr. [name] has been doing her blood work. Her colonoscopy was 2016 which was normal. Her bone density study was also normal in 2016.   Past medical history,surgical history, family history and social history were all reviewed and documented in the EPIC chart.  Gynecologic History No LMP recorded. Patient has had a hysterectomy. Contraception: status post hysterectomy Last Pap:  2013. Results were: normal Last mammogram:  2016. Results were: normal  Obstetric History OB History  Gravida Para Term Preterm AB Living  7 2   2 5 1   SAB TAB Ectopic Multiple Live Births               # Outcome Date GA Lbr Len/2nd Weight Sex Delivery Anes PTL Lv  7 AB           6 AB           5 AB           4 AB           3 AB           2 Preterm           1 Preterm                ROS: A ROS was performed and pertinent positives and negatives are included in the history.  GENERAL: No fevers or chills. HEENT: No change in vision, no earache, sore throat or sinus congestion. NECK: No pain or stiffness. CARDIOVASCULAR: No chest pain or pressure. No palpitations. PULMONARY: No shortness of  breath, cough or wheeze. GASTROINTESTINAL: No abdominal pain, nausea, vomiting or diarrhea, melena or bright red blood per rectum. GENITOURINARY: No urinary frequency, urgency, hesitancy or dysuria. MUSCULOSKELETAL: No joint or muscle pain, no back pain, no recent trauma. DERMATOLOGIC: No rash, no itching, no lesions. ENDOCRINE: No polyuria, polydipsia, no heat or cold intolerance. No recent change in weight. HEMATOLOGICAL: No anemia or easy bruising or bleeding. NEUROLOGIC: No headache, seizures, numbness, tingling or weakness. PSYCHIATRIC: No depression, no loss of interest in normal activity or change in sleep pattern.     Exam: chaperone present  BP 134/82   Ht 5\' 3"  (1.6 m)   Wt 178 lb (80.7 kg)   BMI 31.53 kg/m   Body mass index is 31.53 kg/m.  General appearance : Well developed well nourished female. No acute distress HEENT: Eyes: no retinal hemorrhage or exudates,  Neck supple, trachea midline, no carotid bruits, no thyroidmegaly Lungs: Clear to auscultation, no rhonchi or wheezes, or rib retractions  Heart: Regular rate and rhythm, no murmurs or gallops Breast:Examined in sitting and supine  position were symmetrical in appearance, no palpable masses or tenderness,  no skin retraction, no nipple inversion, no nipple discharge, no skin discoloration, no axillary or supraclavicular lymphadenopathy Abdomen: no palpable masses or tenderness, no rebound or guarding Extremities: no edema or skin discoloration or tenderness  Pelvic:  Bartholin, Urethra, Skene Glands: Within normal limits             Vagina: No gross lesions or discharge  Cervix:absent  Uterus   absent  Adnexa  Without masses or tenderness  Anus and perineum  normal   Rectovaginal  normal sphincter tone without palpated masses or tenderness             Hemoccult  Colonoscopy less than 12 months ago     Assessment/Plan:  65 y.o. female for annual exam  Who was provided with a requisition to schedule her mammogram  at the end of this month. We discussed importance of monthly breast exams. Because of her right lower quadrant  Discomfort an ultrasound will be ordered. Her PCP is been doing her blood work. We discussed importance of calcium vitamin D and weightbearing exercises for osteoporosis prevention.  Terrance Mass MD, 4:17 PM 06/07/2016

## 2016-06-21 ENCOUNTER — Other Ambulatory Visit: Payer: Medicare Other

## 2016-06-21 ENCOUNTER — Ambulatory Visit: Payer: Medicare Other | Admitting: Gynecology

## 2016-07-07 DIAGNOSIS — Z1231 Encounter for screening mammogram for malignant neoplasm of breast: Secondary | ICD-10-CM | POA: Diagnosis not present

## 2016-07-14 ENCOUNTER — Encounter: Payer: Self-pay | Admitting: Gynecology

## 2016-07-14 ENCOUNTER — Ambulatory Visit (INDEPENDENT_AMBULATORY_CARE_PROVIDER_SITE_OTHER): Payer: Medicare Other

## 2016-07-14 ENCOUNTER — Ambulatory Visit (INDEPENDENT_AMBULATORY_CARE_PROVIDER_SITE_OTHER): Payer: Medicare Other | Admitting: Gynecology

## 2016-07-14 VITALS — BP 124/82

## 2016-07-14 DIAGNOSIS — N83202 Unspecified ovarian cyst, left side: Secondary | ICD-10-CM | POA: Diagnosis not present

## 2016-07-14 DIAGNOSIS — Z23 Encounter for immunization: Secondary | ICD-10-CM | POA: Diagnosis not present

## 2016-07-14 DIAGNOSIS — Z8742 Personal history of other diseases of the female genital tract: Secondary | ICD-10-CM | POA: Diagnosis not present

## 2016-07-14 DIAGNOSIS — I255 Ischemic cardiomyopathy: Secondary | ICD-10-CM | POA: Diagnosis not present

## 2016-07-14 DIAGNOSIS — R102 Pelvic and perineal pain: Secondary | ICD-10-CM

## 2016-07-14 NOTE — Progress Notes (Signed)
   Patient presented to the office today to discuss her ultrasound. She was seen the office for her annual exam on July 31 of this year. Patient with past history of total abdominal hysterectomy. Since patient was having some low abdominal discomfort ultrasound was done to assess and to look at her ovaries. Review of her record indicated that back in 2011 she was found to have a right simple cyst of the right ovary and on the left ovary there was an echo-free cyst measuring 9 x 8 x 7 mm with a small calcified area. Patient's otherwise asymptomatic today. Patient with no family history any uterine or ovarian cancer reported.  Ultrasound today absent uterus. Right ovary normal. Previous cyst not seen. Left ovary 2 calcifications 5 mm and 4 mm respectively no fluid cul-de-sac no apparent adnexal masses.  Assessment/plan: Postmenopausal patient with past history of small right ovarian cyst no longer present small calcified area in the left ovary essentially unchanged from 2011. Patient was reassured. We'll obtain a CA 125 today. Patient fully aware of its limitations. Otherwise we'll monitor with yearly ultrasound. Patient requesting to receive flu vaccine today.  Greater than 50% of time was spent in counseling and coordinating care for this patient. Total time of consultation 10 minutes.

## 2016-07-14 NOTE — Patient Instructions (Addendum)
Influenza Virus Vaccine (Flucelvax) What is this medicine? INFLUENZA VIRUS VACCINE (in floo EN zuh VAHY ruhs vak SEEN) helps to reduce the risk of getting influenza also known as the flu. The vaccine only helps protect you against some strains of the flu. This medicine may be used for other purposes; ask your health care provider or pharmacist if you have questions. What should I tell my health care provider before I take this medicine? They need to know if you have any of these conditions: -bleeding disorder like hemophilia -fever or infection -Guillain-Barre syndrome or other neurological problems -immune system problems -infection with the human immunodeficiency virus (HIV) or AIDS -low blood platelet counts -multiple sclerosis -an unusual or allergic reaction to influenza virus vaccine, other medicines, foods, dyes or preservatives -pregnant or trying to get pregnant -breast-feeding How should I use this medicine? This vaccine is for injection into a muscle. It is given by a health care professional. A copy of Vaccine Information Statements will be given before each vaccination. Read this sheet carefully each time. The sheet may change frequently. Talk to your pediatrician regarding the use of this medicine in children. Special care may be needed. Overdosage: If you think you've taken too much of this medicine contact a poison control center or emergency room at once. Overdosage: If you think you have taken too much of this medicine contact a poison control center or emergency room at once. NOTE: This medicine is only for you. Do not share this medicine with others. What if I miss a dose? This does not apply. What may interact with this medicine? -chemotherapy or radiation therapy -medicines that lower your immune system like etanercept, anakinra, infliximab, and adalimumab -medicines that treat or prevent blood clots like warfarin -phenytoin -steroid medicines like prednisone or  cortisone -theophylline -vaccines This list may not describe all possible interactions. Give your health care provider a list of all the medicines, herbs, non-prescription drugs, or dietary supplements you use. Also tell them if you smoke, drink alcohol, or use illegal drugs. Some items may interact with your medicine. What should I watch for while using this medicine? Report any side effects that do not go away within 3 days to your doctor or health care professional. Call your health care provider if any unusual symptoms occur within 6 weeks of receiving this vaccine. You may still catch the flu, but the illness is not usually as bad. You cannot get the flu from the vaccine. The vaccine will not protect against colds or other illnesses that may cause fever. The vaccine is needed every year. What side effects may I notice from receiving this medicine? Side effects that you should report to your doctor or health care professional as soon as possible: -allergic reactions like skin rash, itching or hives, swelling of the face, lips, or tongue Side effects that usually do not require medical attention (Report these to your doctor or health care professional if they continue or are bothersome.): -fever -headache -muscle aches and pains -pain, tenderness, redness, or swelling at the injection site -tiredness This list may not describe all possible side effects. Call your doctor for medical advice about side effects. You may report side effects to FDA at 1-800-FDA-1088. Where should I keep my medicine? The vaccine will be given by a health care professional in a clinic, pharmacy, doctor's office, or other health care setting. You will not be given vaccine doses to store at home. NOTE: This sheet is a summary. It may not cover  all possible information. If you have questions about this medicine, talk to your doctor, pharmacist, or health care provider.    2016, Elsevier/Gold Standard. (2011-10-06  14:06:47) CA-125 Tumor Marker Test WHY AM I HAVING THIS TEST? This test is used to check the level of cancer antigen 125 (CA-125) in your blood. The CA-125 tumor marker test can be helpful in detecting ovarian cancer. The test is only performed if you are considered at high risk for ovarian cancer. Your health care provider may recommend this test if:  You have a strong family history of ovarian cancer.  You have a breast cancer antigen (BRCA) genetic defect. If you have already been diagnosed with ovarian cancer, your health care provider may use this test to help identify the extent of the disease and to monitor your response to treatment. WHAT KIND OF SAMPLE IS TAKEN? A blood sample is required for this test. It is usually collected by inserting a needle into a vein. HOW DO I PREPARE FOR THE TEST? There is no preparation required for this test. WHAT ARE THE REFERENCE RANGES? Reference ranges are considered healthy ranges established after testing a large group of healthy people. Reference ranges may vary among different people, labs, and hospitals. It is your responsibility to obtain your test results. Ask the lab or department performing the test when and how you will get your results. The reference range for this test is 0-35 units/mL or less than 35 kunits/L (SI units). WHAT DO THE RESULTS MEAN? Increased levels of CA-125 may indicate:  Certain types of cancer, including:  Ovarian cancer.  Pancreatic cancer.  Colon cancer.  Lung cancer.  Breast cancer.  Lymphoma.  Noncancerous (benign) disorders, including:  Cirrhosis.  Pregnancy.  Endometriosis.  Pancreatitis.  Pelvic inflammatory disease (PID). Talk with your health care provider to discuss your results, treatment options, and if necessary, the need for more tests. Talk with your health care provider if you have any questions about your results.   This information is not intended to replace advice given to you by  your health care provider. Make sure you discuss any questions you have with your health care provider.   Document Released: 11/16/2004 Document Revised: 11/15/2014 Document Reviewed: 03/14/2014 Elsevier Interactive Patient Education 2016 Elsevier Inc. Ovarian Cyst An ovarian cyst is a fluid-filled sac that forms on an ovary. The ovaries are small organs that produce eggs in women. Various types of cysts can form on the ovaries. Most are not cancerous. Many do not cause problems, and they often go away on their own. Some may cause symptoms and require treatment. Common types of ovarian cysts include:  Functional cysts--These cysts may occur every month during the menstrual cycle. This is normal. The cysts usually go away with the next menstrual cycle if the woman does not get pregnant. Usually, there are no symptoms with a functional cyst.  Endometrioma cysts--These cysts form from the tissue that lines the uterus. They are also called "chocolate cysts" because they become filled with blood that turns brown. This type of cyst can cause pain in the lower abdomen during intercourse and with your menstrual period.  Cystadenoma cysts--This type develops from the cells on the outside of the ovary. These cysts can get very big and cause lower abdomen pain and pain with intercourse. This type of cyst can twist on itself, cut off its blood supply, and cause severe pain. It can also easily rupture and cause a lot of pain.  Dermoid cysts--This type of  cyst is sometimes found in both ovaries. These cysts may contain different kinds of body tissue, such as skin, teeth, hair, or cartilage. They usually do not cause symptoms unless they get very big.  Theca lutein cysts--These cysts occur when too much of a certain hormone (human chorionic gonadotropin) is produced and overstimulates the ovaries to produce an egg. This is most common after procedures used to assist with the conception of a baby (in vitro  fertilization). CAUSES   Fertility drugs can cause a condition in which multiple large cysts are formed on the ovaries. This is called ovarian hyperstimulation syndrome.  A condition called polycystic ovary syndrome can cause hormonal imbalances that can lead to nonfunctional ovarian cysts. SIGNS AND SYMPTOMS  Many ovarian cysts do not cause symptoms. If symptoms are present, they may include:  Pelvic pain or pressure.  Pain in the lower abdomen.  Pain during sexual intercourse.  Increasing girth (swelling) of the abdomen.  Abnormal menstrual periods.  Increasing pain with menstrual periods.  Stopping having menstrual periods without being pregnant. DIAGNOSIS  These cysts are commonly found during a routine or annual pelvic exam. Tests may be ordered to find out more about the cyst. These tests may include:  Ultrasound.  X-ray of the pelvis.  CT scan.  MRI.  Blood tests. TREATMENT  Many ovarian cysts go away on their own without treatment. Your health care provider may want to check your cyst regularly for 2-3 months to see if it changes. For women in menopause, it is particularly important to monitor a cyst closely because of the higher rate of ovarian cancer in menopausal women. When treatment is needed, it may include any of the following:  A procedure to drain the cyst (aspiration). This may be done using a long needle and ultrasound. It can also be done through a laparoscopic procedure. This involves using a thin, lighted tube with a tiny camera on the end (laparoscope) inserted through a small incision.  Surgery to remove the whole cyst. This may be done using laparoscopic surgery or an open surgery involving a larger incision in the lower abdomen.  Hormone treatment or birth control pills. These methods are sometimes used to help dissolve a cyst. HOME CARE INSTRUCTIONS   Only take over-the-counter or prescription medicines as directed by your health care  provider.  Follow up with your health care provider as directed.  Get regular pelvic exams and Pap tests. SEEK MEDICAL CARE IF:   Your periods are late, irregular, or painful, or they stop.  Your pelvic pain or abdominal pain does not go away.  Your abdomen becomes larger or swollen.  You have pressure on your bladder or trouble emptying your bladder completely.  You have pain during sexual intercourse.  You have feelings of fullness, pressure, or discomfort in your stomach.  You lose weight for no apparent reason.  You feel generally ill.  You become constipated.  You lose your appetite.  You develop acne.  You have an increase in body and facial hair.  You are gaining weight, without changing your exercise and eating habits.  You think you are pregnant. SEEK IMMEDIATE MEDICAL CARE IF:   You have increasing abdominal pain.  You feel sick to your stomach (nauseous), and you throw up (vomit).  You develop a fever that comes on suddenly.  You have abdominal pain during a bowel movement.  Your menstrual periods become heavier than usual. MAKE SURE YOU:  Understand these instructions.  Will watch your  condition.  Will get help right away if you are not doing well or get worse.   This information is not intended to replace advice given to you by your health care provider. Make sure you discuss any questions you have with your health care provider.   Document Released: 10/25/2005 Document Revised: 10/30/2013 Document Reviewed: 07/02/2013 Elsevier Interactive Patient Education [employer].

## 2016-07-15 LAB — CA 125: CA 125: 9 U/mL (ref <35)

## 2016-07-29 ENCOUNTER — Telehealth: Payer: Self-pay | Admitting: Interventional Cardiology

## 2016-07-29 ENCOUNTER — Emergency Department (HOSPITAL_COMMUNITY): Payer: Medicare Other

## 2016-07-29 ENCOUNTER — Encounter (HOSPITAL_COMMUNITY): Payer: Self-pay | Admitting: Emergency Medicine

## 2016-07-29 ENCOUNTER — Telehealth: Payer: Self-pay | Admitting: Internal Medicine

## 2016-07-29 ENCOUNTER — Emergency Department (HOSPITAL_COMMUNITY)
Admission: EM | Admit: 2016-07-29 | Discharge: 2016-07-29 | Discharge status: Against Medical Advice | Payer: Medicare Other | Attending: Dermatology | Admitting: Dermatology

## 2016-07-29 DIAGNOSIS — Z79899 Other long term (current) drug therapy: Secondary | ICD-10-CM | POA: Insufficient documentation

## 2016-07-29 DIAGNOSIS — E1122 Type 2 diabetes mellitus with diabetic chronic kidney disease: Secondary | ICD-10-CM | POA: Diagnosis not present

## 2016-07-29 DIAGNOSIS — Z5321 Procedure and treatment not carried out due to patient leaving prior to being seen by health care provider: Secondary | ICD-10-CM | POA: Insufficient documentation

## 2016-07-29 DIAGNOSIS — N189 Chronic kidney disease, unspecified: Secondary | ICD-10-CM | POA: Insufficient documentation

## 2016-07-29 DIAGNOSIS — I252 Old myocardial infarction: Secondary | ICD-10-CM | POA: Diagnosis not present

## 2016-07-29 DIAGNOSIS — I509 Heart failure, unspecified: Secondary | ICD-10-CM | POA: Diagnosis not present

## 2016-07-29 DIAGNOSIS — Z8673 Personal history of transient ischemic attack (TIA), and cerebral infarction without residual deficits: Secondary | ICD-10-CM | POA: Insufficient documentation

## 2016-07-29 DIAGNOSIS — Z7982 Long term (current) use of aspirin: Secondary | ICD-10-CM | POA: Insufficient documentation

## 2016-07-29 DIAGNOSIS — I2510 Atherosclerotic heart disease of native coronary artery without angina pectoris: Secondary | ICD-10-CM | POA: Diagnosis not present

## 2016-07-29 DIAGNOSIS — R079 Chest pain, unspecified: Secondary | ICD-10-CM | POA: Insufficient documentation

## 2016-07-29 DIAGNOSIS — Z950 Presence of cardiac pacemaker: Secondary | ICD-10-CM | POA: Diagnosis not present

## 2016-07-29 DIAGNOSIS — Z951 Presence of aortocoronary bypass graft: Secondary | ICD-10-CM | POA: Insufficient documentation

## 2016-07-29 DIAGNOSIS — J45909 Unspecified asthma, uncomplicated: Secondary | ICD-10-CM | POA: Insufficient documentation

## 2016-07-29 DIAGNOSIS — I130 Hypertensive heart and chronic kidney disease with heart failure and stage 1 through stage 4 chronic kidney disease, or unspecified chronic kidney disease: Secondary | ICD-10-CM | POA: Diagnosis not present

## 2016-07-29 DIAGNOSIS — Z794 Long term (current) use of insulin: Secondary | ICD-10-CM | POA: Insufficient documentation

## 2016-07-29 LAB — BASIC METABOLIC PANEL
ANION GAP: 12 (ref 5–15)
BUN: 9 mg/dL (ref 6–20)
CHLORIDE: 102 mmol/L (ref 101–111)
CO2: 28 mmol/L (ref 22–32)
Calcium: 9.2 mg/dL (ref 8.9–10.3)
Creatinine, Ser: 1.48 mg/dL — ABNORMAL HIGH (ref 0.44–1.00)
GFR calc Af Amer: 42 mL/min — ABNORMAL LOW (ref >60)
GFR, EST NON AFRICAN AMERICAN: 37 mL/min — AB (ref >60)
GLUCOSE: 118 mg/dL — AB (ref 65–99)
POTASSIUM: 4.1 mmol/L (ref 3.5–5.1)
Sodium: 142 mmol/L (ref 135–145)

## 2016-07-29 LAB — I-STAT TROPONIN, ED: Troponin i, poc: 0 ng/mL (ref 0.00–0.08)

## 2016-07-29 LAB — CBC
HEMATOCRIT: 35.2 % — AB (ref 36.0–46.0)
HEMOGLOBIN: 11.3 g/dL — AB (ref 12.0–15.0)
MCH: 27.9 pg (ref 26.0–34.0)
MCHC: 32.1 g/dL (ref 30.0–36.0)
MCV: 86.9 fL (ref 78.0–100.0)
Platelets: 162 10*3/uL (ref 150–400)
RBC: 4.05 MIL/uL (ref 3.87–5.11)
RDW: 13.2 % (ref 11.5–15.5)
WBC: 7.4 10*3/uL (ref 4.0–10.5)

## 2016-07-29 NOTE — Telephone Encounter (Signed)
[name] is calling because she went to E/D , but did not stay

## 2016-07-29 NOTE — ED Notes (Signed)
Pt spoke with registration staff and informed them that pt was leaving prior to being seen.

## 2016-07-29 NOTE — Telephone Encounter (Signed)
CRT-D remote transmission received. Presenting rhythm: AsVP. (1) "VT" episode x 36 sec on 05/19/16--SVT likely per EGMs. Normal device function.  Patient c/o (3) incidences of substernal CP this am while lying down. Patient states that the first incidence was 10/10, second was an 8/10, and third was also a 10/10. All episodes lasted a few seconds. Patient denies any radiation, ShOB, or nausea with CP. Patient states that the 3rd episode CP was relieved by NTG.  I informed patient about results of remote transmission.  I encouraged told patient that she should go to the ER for further evaluation of the CP.   Patient voiced understanding.  Will forward information to Dr.Smith and RN.

## 2016-07-29 NOTE — Telephone Encounter (Signed)
New message      Pt c/o of Chest Pain: STAT if CP now or developed within 24 hours  1. Are you having CP right now?  no 2. Are you experiencing any other symptoms (ex. SOB, nausea, vomiting, sweating)? no  3. How long have you been experiencing CP? Started 30 minutes ago 4. Is your CP continuous or coming and going? Come and go  5. Have you taken Nitroglycerin? yes ?

## 2016-07-29 NOTE — ED Notes (Signed)
All prior triage notes documented by Kenslie Abbruzzese, rn.

## 2016-07-29 NOTE — Telephone Encounter (Signed)
Noted.  Dr. Smith aware.

## 2016-07-29 NOTE — ED Triage Notes (Signed)
Pt reports having mid sharp chest pains this am, took 1 nitro which relieved her pain. Went to pcp and had ICD checked, sent here for further eval. Is currently pain free and reports just feeling fatigued. ekg done at triage, airway intact.

## 2016-07-29 NOTE — Telephone Encounter (Signed)
Patient called on her way home from ED.  She went as instructed earlier.  Labs and CXR were done.  She remained sitting in the waiting area and had not gone back to a room.   She describes two additional episodes of the same chest pain she had earlier today, both subsided without NTG.   She has had no nausea, a little SOB in ED only.  States BP was much higher than normal in ER 160s/100s she reports.   She states she is just exhausted and has to go home and lie down so she signed the papers and left.  Pt is aware that I am forwarding to Dr. Tamala Julian for further review and advisement.  She asked that we use her mobile number when we call back.  [phone].

## 2016-07-30 ENCOUNTER — Encounter (HOSPITAL_COMMUNITY): Payer: Self-pay | Admitting: *Deleted

## 2016-07-30 ENCOUNTER — Observation Stay (HOSPITAL_COMMUNITY)
Admission: EM | Admit: 2016-07-30 | Discharge: 2016-08-01 | Disposition: A | Discharge status: Home / Self Care | Payer: Medicare Other | Attending: Internal Medicine | Admitting: Internal Medicine

## 2016-07-30 DIAGNOSIS — Z951 Presence of aortocoronary bypass graft: Secondary | ICD-10-CM | POA: Diagnosis not present

## 2016-07-30 DIAGNOSIS — I130 Hypertensive heart and chronic kidney disease with heart failure and stage 1 through stage 4 chronic kidney disease, or unspecified chronic kidney disease: Secondary | ICD-10-CM | POA: Diagnosis not present

## 2016-07-30 DIAGNOSIS — I471 Supraventricular tachycardia: Secondary | ICD-10-CM | POA: Insufficient documentation

## 2016-07-30 DIAGNOSIS — Z79899 Other long term (current) drug therapy: Secondary | ICD-10-CM | POA: Diagnosis not present

## 2016-07-30 DIAGNOSIS — I252 Old myocardial infarction: Secondary | ICD-10-CM | POA: Insufficient documentation

## 2016-07-30 DIAGNOSIS — E119 Type 2 diabetes mellitus without complications: Secondary | ICD-10-CM

## 2016-07-30 DIAGNOSIS — R002 Palpitations: Secondary | ICD-10-CM | POA: Diagnosis not present

## 2016-07-30 DIAGNOSIS — J45909 Unspecified asthma, uncomplicated: Secondary | ICD-10-CM | POA: Insufficient documentation

## 2016-07-30 DIAGNOSIS — Z794 Long term (current) use of insulin: Secondary | ICD-10-CM | POA: Insufficient documentation

## 2016-07-30 DIAGNOSIS — N183 Chronic kidney disease, stage 3 (moderate): Secondary | ICD-10-CM | POA: Insufficient documentation

## 2016-07-30 DIAGNOSIS — Z7902 Long term (current) use of antithrombotics/antiplatelets: Secondary | ICD-10-CM | POA: Insufficient documentation

## 2016-07-30 DIAGNOSIS — E1140 Type 2 diabetes mellitus with diabetic neuropathy, unspecified: Secondary | ICD-10-CM | POA: Insufficient documentation

## 2016-07-30 DIAGNOSIS — I255 Ischemic cardiomyopathy: Secondary | ICD-10-CM | POA: Diagnosis not present

## 2016-07-30 DIAGNOSIS — E785 Hyperlipidemia, unspecified: Secondary | ICD-10-CM

## 2016-07-30 DIAGNOSIS — F32A Depression, unspecified: Secondary | ICD-10-CM | POA: Diagnosis present

## 2016-07-30 DIAGNOSIS — I5032 Chronic diastolic (congestive) heart failure: Secondary | ICD-10-CM | POA: Diagnosis present

## 2016-07-30 DIAGNOSIS — E1122 Type 2 diabetes mellitus with diabetic chronic kidney disease: Secondary | ICD-10-CM | POA: Diagnosis not present

## 2016-07-30 DIAGNOSIS — F419 Anxiety disorder, unspecified: Secondary | ICD-10-CM | POA: Diagnosis not present

## 2016-07-30 DIAGNOSIS — Z8673 Personal history of transient ischemic attack (TIA), and cerebral infarction without residual deficits: Secondary | ICD-10-CM | POA: Diagnosis not present

## 2016-07-30 DIAGNOSIS — I5042 Chronic combined systolic (congestive) and diastolic (congestive) heart failure: Secondary | ICD-10-CM | POA: Insufficient documentation

## 2016-07-30 DIAGNOSIS — F329 Major depressive disorder, single episode, unspecified: Secondary | ICD-10-CM | POA: Diagnosis not present

## 2016-07-30 DIAGNOSIS — Z7982 Long term (current) use of aspirin: Secondary | ICD-10-CM | POA: Insufficient documentation

## 2016-07-30 DIAGNOSIS — R0789 Other chest pain: Secondary | ICD-10-CM | POA: Diagnosis not present

## 2016-07-30 DIAGNOSIS — K219 Gastro-esophageal reflux disease without esophagitis: Secondary | ICD-10-CM | POA: Insufficient documentation

## 2016-07-30 DIAGNOSIS — Z95810 Presence of automatic (implantable) cardiac defibrillator: Secondary | ICD-10-CM | POA: Insufficient documentation

## 2016-07-30 DIAGNOSIS — I10 Essential (primary) hypertension: Secondary | ICD-10-CM | POA: Diagnosis present

## 2016-07-30 DIAGNOSIS — I341 Nonrheumatic mitral (valve) prolapse: Secondary | ICD-10-CM | POA: Diagnosis not present

## 2016-07-30 DIAGNOSIS — I2510 Atherosclerotic heart disease of native coronary artery without angina pectoris: Secondary | ICD-10-CM | POA: Diagnosis not present

## 2016-07-30 DIAGNOSIS — R079 Chest pain, unspecified: Principal | ICD-10-CM | POA: Diagnosis present

## 2016-07-30 HISTORY — DX: Hyperlipidemia, unspecified: E78.5

## 2016-07-30 LAB — CBC
HEMATOCRIT: 34 % — AB (ref 36.0–46.0)
Hemoglobin: 11.1 g/dL — ABNORMAL LOW (ref 12.0–15.0)
MCH: 28 pg (ref 26.0–34.0)
MCHC: 32.6 g/dL (ref 30.0–36.0)
MCV: 85.9 fL (ref 78.0–100.0)
PLATELETS: 170 10*3/uL (ref 150–400)
RBC: 3.96 MIL/uL (ref 3.87–5.11)
RDW: 13.3 % (ref 11.5–15.5)
WBC: 7.9 10*3/uL (ref 4.0–10.5)

## 2016-07-30 LAB — BASIC METABOLIC PANEL
Anion gap: 7 (ref 5–15)
BUN: 9 mg/dL (ref 6–20)
CO2: 33 mmol/L — ABNORMAL HIGH (ref 22–32)
CREATININE: 1.59 mg/dL — AB (ref 0.44–1.00)
Calcium: 9.4 mg/dL (ref 8.9–10.3)
Chloride: 101 mmol/L (ref 101–111)
GFR calc Af Amer: 39 mL/min — ABNORMAL LOW (ref >60)
GFR, EST NON AFRICAN AMERICAN: 33 mL/min — AB (ref >60)
GLUCOSE: 133 mg/dL — AB (ref 65–99)
POTASSIUM: 3.8 mmol/L (ref 3.5–5.1)
SODIUM: 141 mmol/L (ref 135–145)

## 2016-07-30 LAB — TROPONIN I: Troponin I: <0.03 ng/mL (ref <0.03)

## 2016-07-30 LAB — GLUCOSE, CAPILLARY: Glucose-Capillary: 98 mg/dL (ref 65–99)

## 2016-07-30 MED ORDER — ASPIRIN EC 325 MG PO TBEC
325.0000 mg | DELAYED_RELEASE_TABLET | Freq: Every day | ORAL | Status: DC
  Administered 2016-07-31 – 2016-08-01 (×2): 325 mg via ORAL
  Filled 2016-07-30 (×2): qty 1

## 2016-07-30 MED ORDER — ONDANSETRON HCL 4 MG/2ML IJ SOLN: 4.0000 mg | Freq: Four times a day (QID) | INTRAMUSCULAR | Status: DC | PRN

## 2016-07-30 MED ORDER — METOPROLOL TARTRATE 100 MG PO TABS
100.0000 mg | ORAL_TABLET | Freq: Two times a day (BID) | ORAL | Status: DC
  Administered 2016-07-30 – 2016-08-01 (×4): 100 mg via ORAL
  Filled 2016-07-30 (×4): qty 1

## 2016-07-30 MED ORDER — SODIUM CHLORIDE 0.9% FLUSH
3.0000 mL | Freq: Two times a day (BID) | INTRAVENOUS | Status: DC
  Administered 2016-07-31 – 2016-08-01 (×3): 3 mL via INTRAVENOUS

## 2016-07-30 MED ORDER — VENLAFAXINE HCL 75 MG PO TABS
200.0000 mg | ORAL_TABLET | Freq: Every morning | ORAL | Status: DC
  Administered 2016-07-31 – 2016-08-01 (×2): 200 mg via ORAL
  Filled 2016-07-30 (×2): qty 1

## 2016-07-30 MED ORDER — VENLAFAXINE HCL 50 MG PO TABS
100.0000 mg | ORAL_TABLET | Freq: Every day | ORAL | Status: DC
  Administered 2016-07-31 – 2016-08-01 (×2): 100 mg via ORAL
  Filled 2016-07-30 (×2): qty 2

## 2016-07-30 MED ORDER — SODIUM CHLORIDE (HYPERTONIC) 5 % OP OINT
TOPICAL_OINTMENT | Freq: Every evening | OPHTHALMIC | Status: DC | PRN
  Filled 2016-07-30: qty 3.5

## 2016-07-30 MED ORDER — FUROSEMIDE 80 MG PO TABS
160.0000 mg | ORAL_TABLET | Freq: Every morning | ORAL | Status: DC
  Administered 2016-07-31 – 2016-08-01 (×2): 160 mg via ORAL
  Filled 2016-07-30 (×2): qty 2

## 2016-07-30 MED ORDER — EZETIMIBE 10 MG PO TABS
10.0000 mg | ORAL_TABLET | Freq: Every day | ORAL | Status: DC
  Administered 2016-07-31 – 2016-08-01 (×2): 10 mg via ORAL
  Filled 2016-07-30 (×2): qty 1

## 2016-07-30 MED ORDER — ADULT MULTIVITAMIN W/MINERALS CH
1.0000 | ORAL_TABLET | Freq: Every day | ORAL | Status: DC
Start: 2016-07-31 — End: 2016-08-01
  Administered 2016-07-31 – 2016-08-01 (×2): 1 via ORAL
  Filled 2016-07-30: qty 1

## 2016-07-30 MED ORDER — POTASSIUM CHLORIDE ER 10 MEQ PO TBCR
20.0000 meq | EXTENDED_RELEASE_TABLET | Freq: Two times a day (BID) | ORAL | Status: DC
Start: 2016-07-30 — End: 2016-08-01
  Administered 2016-07-30 – 2016-08-01 (×4): 20 meq via ORAL
  Filled 2016-07-30 (×7): qty 2

## 2016-07-30 MED ORDER — RAMIPRIL 10 MG PO CAPS
10.0000 mg | ORAL_CAPSULE | Freq: Two times a day (BID) | ORAL | Status: DC
  Administered 2016-07-30 – 2016-08-01 (×4): 10 mg via ORAL
  Filled 2016-07-30 (×4): qty 1

## 2016-07-30 MED ORDER — VITAMIN D3 25 MCG (1000 UNIT) PO TABS
1000.0000 [IU] | ORAL_TABLET | Freq: Every day | ORAL | Status: DC
  Filled 2016-07-30: qty 1

## 2016-07-30 MED ORDER — FLUTICASONE PROPIONATE 50 MCG/ACT NA SUSP
2.0000 | Freq: Every day | NASAL | Status: DC | PRN
  Filled 2016-07-30: qty 16

## 2016-07-30 MED ORDER — NITROGLYCERIN 0.4 MG SL SUBL
0.4000 mg | SUBLINGUAL_TABLET | SUBLINGUAL | Status: DC | PRN
  Filled 2016-07-30: qty 1

## 2016-07-30 MED ORDER — LORATADINE 10 MG PO TABS
10.0000 mg | ORAL_TABLET | Freq: Every day | ORAL | Status: DC
Start: 2016-07-31 — End: 2016-08-01
  Administered 2016-07-31 – 2016-08-01 (×2): 10 mg via ORAL
  Filled 2016-07-30 (×2): qty 1

## 2016-07-30 MED ORDER — PREGABALIN 100 MG PO CAPS
100.0000 mg | ORAL_CAPSULE | Freq: Every day | ORAL | Status: DC
  Administered 2016-07-30 – 2016-07-31 (×2): 100 mg via ORAL
  Filled 2016-07-30 (×2): qty 1

## 2016-07-30 MED ORDER — CLOPIDOGREL BISULFATE 75 MG PO TABS
75.0000 mg | ORAL_TABLET | Freq: Every day | ORAL | Status: DC
  Administered 2016-07-31 – 2016-08-01 (×2): 75 mg via ORAL
  Filled 2016-07-30 (×2): qty 1

## 2016-07-30 MED ORDER — TRAMADOL HCL 50 MG PO TABS: 50.0000 mg | ORAL_TABLET | ORAL | Status: DC | PRN

## 2016-07-30 MED ORDER — PANTOPRAZOLE SODIUM 40 MG PO TBEC: 40.0000 mg | DELAYED_RELEASE_TABLET | Freq: Every day | ORAL | Status: DC

## 2016-07-30 MED ORDER — ENOXAPARIN SODIUM 40 MG/0.4ML ~~LOC~~ SOLN
40.0000 mg | SUBCUTANEOUS | Status: DC
  Administered 2016-07-30 – 2016-07-31 (×2): 40 mg via SUBCUTANEOUS
  Filled 2016-07-30 (×2): qty 0.4

## 2016-07-30 MED ORDER — INSULIN GLARGINE 100 UNIT/ML ~~LOC~~ SOLN
18.0000 [IU] | Freq: Every day | SUBCUTANEOUS | Status: DC
Start: 2016-07-30 — End: 2016-08-01
  Administered 2016-07-30 – 2016-07-31 (×2): 18 [IU] via SUBCUTANEOUS
  Filled 2016-07-30 (×3): qty 0.18

## 2016-07-30 MED ORDER — SIMVASTATIN 20 MG PO TABS
20.0000 mg | ORAL_TABLET | Freq: Every day | ORAL | Status: DC
  Administered 2016-07-30 – 2016-07-31 (×2): 20 mg via ORAL
  Filled 2016-07-30 (×2): qty 1

## 2016-07-30 MED ORDER — ISOSORBIDE MONONITRATE ER 30 MG PO TB24
30.0000 mg | ORAL_TABLET | Freq: Every day | ORAL | Status: DC
  Administered 2016-07-31 – 2016-08-01 (×2): 30 mg via ORAL
  Filled 2016-07-30 (×2): qty 1

## 2016-07-30 MED ORDER — VITAMIN D 1000 UNITS PO TABS
1000.0000 [IU] | ORAL_TABLET | Freq: Every day | ORAL | Status: DC
  Administered 2016-07-30 – 2016-08-01 (×3): 1000 [IU] via ORAL
  Filled 2016-07-30 (×3): qty 1

## 2016-07-30 MED ORDER — PANTOPRAZOLE SODIUM 40 MG PO TBEC
40.0000 mg | DELAYED_RELEASE_TABLET | Freq: Every day | ORAL | Status: DC
  Administered 2016-07-31 – 2016-08-01 (×2): 40 mg via ORAL
  Filled 2016-07-30 (×4): qty 1

## 2016-07-30 MED ORDER — ALPRAZOLAM 0.25 MG PO TABS
0.2500 mg | ORAL_TABLET | Freq: Three times a day (TID) | ORAL | Status: DC
  Administered 2016-07-30 – 2016-08-01 (×5): 0.5 mg via ORAL
  Filled 2016-07-30 (×5): qty 2

## 2016-07-30 MED ORDER — ALBUTEROL SULFATE (2.5 MG/3ML) 0.083% IN NEBU: 2.5000 mg | INHALATION_SOLUTION | RESPIRATORY_TRACT | Status: DC | PRN

## 2016-07-30 MED ORDER — ACETAMINOPHEN 325 MG PO TABS: 650.0000 mg | ORAL_TABLET | Freq: Four times a day (QID) | ORAL | Status: DC | PRN

## 2016-07-30 MED ORDER — VENLAFAXINE HCL 50 MG PO TABS: 100.0000 mg | ORAL_TABLET | ORAL | Status: DC

## 2016-07-30 MED ORDER — DOXAZOSIN MESYLATE 8 MG PO TABS
4.0000 mg | ORAL_TABLET | Freq: Every day | ORAL | Status: DC
  Administered 2016-07-30 – 2016-07-31 (×2): 4 mg via ORAL
  Filled 2016-07-30 (×2): qty 1

## 2016-07-30 NOTE — H&P (Signed)
History and Physical    [name] YOV:[id number] DOB: [birth year] DOA: 07/30/2016  PCP: Reginia Naas, MD   Patient coming from: Home.  Chief Complaint: Chest pain.  HPI: [name] is a 65 y.o. female with medical history significant of AICD placement, CAD, combined systolic/diastolic CHF, mitral valve prolapse, chronic kidney disease, anxiety, asthma, back pain, GERD, depression, hypertension, iron deficiency anemia, migraine headaches with coming to the emergency department due to sharp chest pain since yesterday morning.  Per patient, yesterday morning around 10:30 to 11:00, she was still in bed watching television, when she developed precordial sharp pain radiated to the left arm associated with mild dyspnea and lightheadedness. She denies diaphoresis, nausea, palpitations associated with this episode. She came to the emergency department yesterday, but left before she was evaluated because she tired and wanted to go home. She mentions that the nitroglycerin that was given in the emergency department prior to her departure result her chest pain. She states that she had a similar episode this morning with associated palpitations and call her PCP, who recommended her to come to the emergency department.   ED Course: She received sublingual nitroglycerin 0.4 mg sublingually 2 which resolved the pain. She mentions that she is chest pain-free at this time. EKG showed atrial paced rhythm, first troponin and second troponin were normal, hemoglobin 11.1 g/dL, creatinine 1.59 mg/dL, glucose 133 mg/dL and CO2 33 mmol/L.  Review of Systems: As per HPI otherwise 10 point review of systems negative.    Past Medical History:  Diagnosis Date  . AICD (automatic cardioverter/defibrillator) present    [employer]- Dr. Beckie Salts follows  . Anginal pain ([place])   . Anxiety   . Asthma   . Back pain    "pinched nerve-lower back" - Dr. Nelva Bush follows.  . Cervical dysplasia   . CHF  (congestive heart failure) ([place])   . Chronic kidney disease    Dr. Posey Pronto follows.  . Complication of anesthesia    "I wake up during surgeries" (02/14/2013)  . Coronary artery disease   . Depression   . Fibroid   . Function kidney decreased   . GERD (gastroesophageal reflux disease)   . Hepatitis    Hepatitis A -college yrs"water source exposure"  . Hiatal hernia   . History of shingles    2-3 yrs ago last out break "around waist"  . History of stomach ulcers   . Hypertension   . ICD (implantable cardiac defibrillator) in place   . Iron deficiency anemia   . Ischemic cardiomyopathy    status post biventricular ICD placed by DR Edumunds who used to see Dr Melvern Banker here to establish  cardiovascular care.  . Migraines   . MVP (mitral valve prolapse)    Antibiotics not required for procedures  . Myocardial infarction ([place])    "I've had 2; the others they were able to catch before completing" (02/14/2013)  . Pacemaker   . Pneumonia 1950's & 1985  . Shortness of breath    "lying down flat; at times w/exertion" (02/14/2013). 10-06-15 exertion only..  . Stroke [facility])    "2 confirmed; 9 TIA's; results in dragging LLE; numbness in tip of tongue" (02/14/2013),10-06-15 right hand tends to be weaker when tired.  . Type II diabetes mellitus ([place])     Past Surgical History:  Procedure Laterality Date  . ABDOMINAL HYSTERECTOMY  1985   TAH   . BIV ICD GENERTAOR CHANGE OUT  06/2007; 04/2008   "2 lead initial placement,  at [facility]; done at [facility], after developing CHF" (02/14/2013)  . BIV PACEMAKER GENERATOR CHANGE OUT N/A 01/29/2015   Procedure: BIV PACEMAKER GENERATOR CHANGE OUT;  Surgeon: Evans Lance, MD;  Location: [facility] CATH LAB;  Service: Cardiovascular;  Laterality: N/A;  . BREAST EXCISIONAL BIOPSY Left 01/2007; 06/2007; 03/2008   "benign" (02/14/2013)  . BREAST SURGERY    . CARDIAC CATHETERIZATION     "probably in the teens" (02/14/2013)  . CARDIAC DEFIBRILLATOR PLACEMENT    . COLONOSCOPY  ~ 2002  .  COLONOSCOPY WITH PROPOFOL N/A 10/07/2015   Procedure: COLONOSCOPY WITH PROPOFOL;  Surgeon: Juanita Craver, MD;  Location: [facility] ENDOSCOPY;  Service: Endoscopy;  Laterality: N/A;  . CORONARY ANGIOPLASTY WITH STENT PLACEMENT     "started out w/5; bypass corrected some; 1 stent since the bypass" (02/14/2013)  . CORONARY ARTERY BYPASS GRAFT  ` 1998   LIMA-LAD, SVG-D1, SVG-PDA  . [place] OF UTERUS  1975 X 2; 1976; 1977  . INSERT / REPLACE / REMOVE PACEMAKER     biventricular defibrillator--06/10/ 2009  . LEFT HEART CATHETERIZATION WITH CORONARY/GRAFT ANGIOGRAM N/A 01/03/2015   Procedure: LEFT HEART CATHETERIZATION WITH Beatrix Fetters;  Surgeon: Sinclair Grooms, MD;  Location: [facility] CATH LAB;  Service: Cardiovascular;  Laterality: N/A;  . SUPRAVENTRICULAR TACHYCARDIA ABLATION  06/2007  . TEE WITHOUT CARDIOVERSION  11/14/2012   Procedure: TRANSESOPHAGEAL ECHOCARDIOGRAM (TEE);  Surgeon: Candee Furbish, MD;  Location: [facility] ENDOSCOPY;  Service: Cardiovascular;  Laterality: N/A;     reports that she has never smoked. She has never used smokeless tobacco. She reports that she does not drink alcohol or use drugs.  Allergies  Allergen Reactions  . Calcium Channel Blockers Other (See Comments)    Cannot take non-DHP CCBs (diltiazem, verapamil) due to CHF requiring ICD  . Codeine Anaphylaxis  . Other Anaphylaxis    Walnuts, pecans, and ANY melons PATIENT IS A VEGAN  . Ticlid [Ticlopidine Hcl] Other (See Comments)    Unprovoked bleeding while on Ticlid (doesn't think she was on ASA at the time) Takes Plavix without problems  . Morphine And Related Other (See Comments)    Severe AMS, confusion, weakness Tolerates Fentanyl, Tramadol  . Tape Dermatitis and Rash    Tolerates paper tape  . Digoxin And Related Nausea Only    Unknown  . Metolazone Other (See Comments)    Cannot remember reaction  . Milk-Related Compounds Other (See Comments)    Bad gas  . Myrbetriq [Mirabegron] Other (See  Comments)    Aggravates migraines  . Onglyza [Saxagliptin] Other (See Comments)    Exacerbates migraines  . Penicillins Hives    Has patient had a PCN reaction causing immediate rash, facial/tongue/throat swelling, SOB or lightheadedness with hypotension: Yes Has patient had a PCN reaction causing severe rash involving mucus membranes or skin necrosis: Unknown Has patient had a PCN reaction that required hospitalization: Unknown Has patient had a PCN reaction occurring within the last 10 years: No If all of the above answers are "NO", then may proceed with Cephalosporin use.   [name] Spironolactone Other (See Comments)    Cannot remember reaction  . Sulfa Antibiotics Hives  . Tetracyclines & Related Other (See Comments)    Cannot remember reaction Tolerates macrolides (azithromycin)  . Vicodin [Hydrocodone-Acetaminophen] Other (See Comments)    EXTREME LETHARGY  . Latex Itching and Rash    Family History  Problem Relation Age of Onset  . Heart disease Mother   . Hypertension Mother   .  Heart attack Mother   . Stroke Mother   . Heart disease Father   . Hypertension Father   . Diabetes Father   . Kidney failure Father   . Heart attack Father   . Stroke Father   . Heart disease Brother   . Diabetes Brother   . Kidney failure Brother   . Heart attack Brother   . Diabetes Paternal Grandmother     Prior to Admission medications   Medication Sig Start Date End Date Taking? Authorizing Provider  albuterol (PROVENTIL HFA;VENTOLIN HFA) 108 (90 BASE) MCG/ACT inhaler Inhale 2 puffs into the lungs every 4 (four) hours as needed for wheezing or shortness of breath.   Yes Historical Provider, MD  ALPRAZolam Duanne Moron) 0.5 MG tablet Take 0.5-1 tablets by mouth 3 (three) times daily. 1 mg in the morning then 0.5 mg at noon then 0.5 mg at bedtime 09/09/15  Yes Historical Provider, MD  Artificial Tear GEL Place 2 drops into both eyes daily as needed (dry eyes).    Yes Historical Provider, MD    aspirin 325 MG EC tablet Take 325 mg by mouth daily.     Yes Historical Provider, MD  cetirizine (ZYRTEC) 10 MG tablet Take 10 mg by mouth daily.   Yes Historical Provider, MD  Cholecalciferol (VITAMIN D-3) 1000 UNITS CAPS Take 1 capsule by mouth daily.    Yes Historical Provider, MD  clopidogrel (PLAVIX) 75 MG tablet Take 1 tablet by mouth  daily 03/09/16  Yes Belva Crome, MD  cyclobenzaprine (FLEXERIL) 5 MG tablet Take 1 tablet (5 mg total) by mouth at bedtime as needed for muscle spasms. 01/04/16  Yes Oswald Hillock, MD  doxazosin (CARDURA) 4 MG tablet Take 4 mg by mouth at bedtime.    Yes Historical Provider, MD  fluticasone (FLONASE) 50 MCG/ACT nasal spray Place 2 sprays into both nostrils daily as needed for allergies or rhinitis.    Yes Historical Provider, MD  furosemide (LASIX) 80 MG tablet Take 1 tablet by mouth two  times daily. May take an additional 80mg  daily as needed for edema. Patient taking differently: Take 160 mg by mouth every morning.  03/22/16  Yes Jolaine Artist, MD  Insulin Glargine (LANTUS SOLOSTAR) 100 UNIT/ML Solostar Pen Inject 18 Units into the skin at bedtime.   Yes Historical Provider, MD  isosorbide mononitrate (IMDUR) 30 MG 24 hr tablet Take 1 tablet by mouth  daily Patient taking differently: Take 30 mg by mouth in the morning 02/05/16  Yes Belva Crome, MD  metoprolol (LOPRESSOR) 100 MG tablet Take 1 tablet by mouth two  times daily 03/09/16  Yes Belva Crome, MD  Multiple Vitamin (MULTIVITAMIN WITH MINERALS) TABS Take 1 tablet by mouth daily.   Yes Historical Provider, MD  NITROSTAT 0.4 MG SL tablet Place 1 tablet under the tongue every 5 minutes as needed for chest pain, max 3 doses, go to er if no relief 11/18/14  Yes Belva Crome, MD  ondansetron (ZOFRAN ODT) 4 MG disintegrating tablet Take 1 tablet (4 mg total) by mouth every 8 (eight) hours as needed for nausea or vomiting. 12/18/15  Yes Everlene Balls, MD  pantoprazole (PROTONIX) 40 MG tablet Take 40 mg by mouth  daily.   Yes Historical Provider, MD  potassium chloride (K-DUR) 10 MEQ tablet Take 2 tablets (20 mEq total) by mouth 2 (two) times daily. 11/26/13  Yes Belva Crome, MD  pregabalin (LYRICA) 50 MG capsule Take 100 mg by  mouth at bedtime.    Yes Historical Provider, MD  ramipril (ALTACE) 10 MG capsule Take 1 capsule by mouth  twice daily Patient taking differently: Take 10 mg by mouth two times a day 05/17/16  Yes Belva Crome, MD  Riboflavin 100 MG TABS Take 2 tablets (200 mg total) by mouth daily. 01/04/16  Yes Oswald Hillock, MD  simvastatin (ZOCOR) 20 MG tablet Take 1 tablet by mouth at  bedtime 10/06/15  Yes Belva Crome, MD  sodium chloride (MURO 128) 5 % ophthalmic ointment Place 1 drop into both eyes at bedtime as needed. For dry eyes   Yes Historical Provider, MD  traMADol (ULTRAM) 50 MG tablet Take 100 mg by mouth every 6 (six) hours as needed (MIGRAINES).   Yes Historical Provider, MD  venlafaxine (EFFEXOR) 100 MG tablet Take 100-200 mg by mouth See admin instructions. 200 mg in the morning then 100 mg at noon 04/12/11  Yes Historical Provider, MD  ZETIA 10 MG tablet Take 1 tablet by mouth  daily 03/09/16  Yes Belva Crome, MD  Insulin Glargine [facility]) 100 UNIT/ML SOPN Inject 18 Units into the skin at bedtime. Reported on 02/18/2016    Historical Provider, MD  potassium chloride (K-DUR,KLOR-CON) 10 MEQ tablet Take 2 tablets by mouth two times daily Patient not taking: Reported on 07/30/2016 05/17/16   Belva Crome, MD    Physical Exam:  Constitutional: NAD, calm, comfortable Vitals:   07/30/16 1900 07/30/16 1915 07/30/16 1930 07/30/16 1945  BP: 142/97 113/82 135/93 133/84  Pulse: 68 69 65 68  Resp: 15 17 19 12   Temp:      TempSrc:      SpO2: 99% 99% 96% 95%   Eyes: PERRL, lids and conjunctivae normal ENMT: Mucous membranes are moist. Posterior pharynx clear of any exudate or lesions. Neck: normal, supple, no masses, no thyromegaly Respiratory: clear to auscultation  bilaterally, no wheezing, no crackles. Normal respiratory effort. No accessory muscle use.  Cardiovascular: Regular rate and rhythm, no murmurs / rubs / gallops. No extremity edema. 2+ pedal pulses. No carotid bruits.  Abdomen: no tenderness, no masses palpated. No hepatosplenomegaly. Bowel sounds positive.  Musculoskeletal: no clubbing / cyanosis. Good ROM, no contractures. Normal muscle tone.  Skin: no gross rashes, lesions, ulcers on limited skin exam. Neurologic: CN 2-12 grossly intact. Sensation intact, DTR normal. Strength 5/5 in all 4.  Psychiatric: Normal judgment and insight. Alert and oriented x 4. Normal mood.     Labs on Admission: I have personally reviewed following labs and imaging studies  CBC:  Recent Labs Lab 07/29/16 1233 07/30/16 1628  WBC 7.4 7.9  HGB 11.3* 11.1*  HCT 35.2* 34.0*  MCV 86.9 85.9  PLT 162 213   Basic Metabolic Panel:  Recent Labs Lab 07/29/16 1233 07/30/16 1628  NA 142 141  K 4.1 3.8  CL 102 101  CO2 28 33*  GLUCOSE 118* 133*  BUN 9 9  CREATININE 1.48* 1.59*  CALCIUM 9.2 9.4   GFR: CrCl cannot be calculated (Unknown ideal weight.). Liver Function Tests: No results for input(s): AST, ALT, ALKPHOS, BILITOT, PROT, ALBUMIN in the last 168 hours. No results for input(s): LIPASE, AMYLASE in the last 168 hours. No results for input(s): AMMONIA in the last 168 hours. Coagulation Profile: No results for input(s): INR, PROTIME in the last 168 hours. Cardiac Enzymes:  Recent Labs Lab 07/30/16 1628  TROPONINI <0.03   BNP (last 3 results) No results for input(s): PROBNP  in the last 8760 hours. HbA1C: No results for input(s): HGBA1C in the last 72 hours. CBG: No results for input(s): GLUCAP in the last 168 hours. Lipid Profile: No results for input(s): CHOL, HDL, LDLCALC, TRIG, CHOLHDL, LDLDIRECT in the last 72 hours. Thyroid Function Tests: No results for input(s): TSH, T4TOTAL, FREET4, T3FREE, THYROIDAB in the last 72  hours. Anemia Panel: No results for input(s): VITAMINB12, FOLATE, FERRITIN, TIBC, IRON, RETICCTPCT in the last 72 hours. Urine analysis:    Component Value Date/Time   COLORURINE YELLOW 01/01/2016 1905   APPEARANCEUR CLEAR 01/01/2016 1905   LABSPEC 1.008 01/01/2016 1905   PHURINE 7.5 01/01/2016 1905   GLUCOSEU NEGATIVE 01/01/2016 1905   HGBUR NEGATIVE 01/01/2016 1905   BILIRUBINUR NEGATIVE 01/01/2016 1905   KETONESUR NEGATIVE 01/01/2016 [place] NEGATIVE 01/01/2016 1905   UROBILINOGEN 0.2 02/14/2013 2014   NITRITE NEGATIVE 01/01/2016 1905   LEUKOCYTESUR NEGATIVE 01/01/2016 1905    Radiological Exams on Admission: Dg Chest 2 View  Result Date: 07/29/2016 CLINICAL DATA:  Mid chest pain and dyspnea EXAM: CHEST  2 VIEW COMPARISON:  12/18/2015 FINDINGS: The heart is top-normal in size. The aorta is not aneurysmal. The patient is status post median sternotomy with evidence of prior CABG. The ICD device projects over the left anterior chest wall with leads noted in the right atrium, right ventricle and coronary sinus. Lungs are clear apart from some minimal atelectasis at the left lung base. No effusion or pneumothorax. No acute osseous abnormality. IMPRESSION: No active cardiopulmonary disease. Electronically Signed   By: Ashley Royalty M.D.   On: 07/29/2016 12:55  Echocardiogram February 2017 ------------------------------------------------------------------- LV EF: 45% -   50%  ------------------------------------------------------------------- Indications:      TIA 435.9.  ------------------------------------------------------------------- History:   PMH:  CKD III.  Coronary artery disease.  Risk factors: Hypertension. Diabetes mellitus.  ------------------------------------------------------------------- Study Conclusions  - Left ventricle: The cavity size was normal. Systolic function was   mildly reduced. The estimated ejection fraction was in the range   of 45% to  50%. Diffuse hypokinesis. Features are consistent with   a pseudonormal left ventricular filling pattern, with concomitant   abnormal relaxation and increased filling pressure (grade 2   diastolic dysfunction). - Mitral valve: There was moderate regurgitation. - Left atrium: The atrium was moderately dilated. - Right ventricle: Poorly visualized. - Tricuspid valve: There was mild regurgitation.  EKG: Independently reviewed. Vent. rate 72 BPM PR interval 140 ms QRS duration 144 ms QT/QTc 446/488 ms P-R-T axes 67 34 96 Atrial-sensed ventricular-paced rhythm Abnormal ECG  Assessment/Plan Principal Problem:   Chest pain   History of CAD. Admit to telemetry unit/observation Continue cardiac monitoring. Supplemental oxygen as needed. Sublingual nitroglycerin as needed. Trend troponin levels. Continue aspirin 325 mg by mouth daily. Continue metoprolol 100 mg by mouth twice a day Continue Plavix 75 mg by mouth daily. Continue simvastatin and Zetia. The patient is scheduled in the near future for a cardiology appointment.  Active Problems:   Asthma Stable at this time. Continue albuterol MDI as needed. Continue Zyrtec 10 mg by mouth daily.    Anxiety   Depression Continue Effexor 200 mg in the morning and 100 mg in the afternoon.    HTN (hypertension) Continue Cardura 4 mg by mouth daily at bedtime. Continue ranitidine 10 mg by mouth daily. Continue metoprolol 100 mg by mouth twice a day. On furosemide for congestive heart failure. Monitor blood pressure periodically.    DM (diabetes mellitus) ([place]) Hemoglobin A1c was  6.9% 7 months ago. Carbohydrate modified diet. Continue Lantus 18 units SQ at bedtime.    Chronic diastolic heart failure ([facility]) Fluid and sodium restriction. Continue furosemide, metoprolol and ramipril.    Hyperlipidemia On simvastatin 20 mg by mouth daily. On Zetia 10 mg by mouth daily. Interval fasting lipids and hepatic function panel  monitoring.    DVT prophylaxis: Lovenox SQ. Code Status: Full code. Family Communication:  Disposition Plan: Admit for cardiac monitoring and troponin levels trending. Consults called:  Admission status: Observation/telemetry   Reubin Milan MD [facility] Pager [phone].  If 7PM-7AM, please contact night-coverage [email or web address] Password [facility]  07/30/2016, 9:09 PM

## 2016-07-30 NOTE — ED Notes (Signed)
Requested charge RN to interrogate patient's pacemaker ([employer])

## 2016-07-30 NOTE — ED Triage Notes (Signed)
The pt is c/o lt upper chest pain for 2-3 days  She was in the ed last pm but left without being seen.  She is not having chest pain at present.  She took a sl nitro earlier today and the pain went away.

## 2016-07-30 NOTE — ED Provider Notes (Signed)
[facility] DEPT Provider Note   CSN: [id number] Arrival date & time: 07/30/16  1621     History   Chief Complaint Chief Complaint  Patient presents with  . Chest Pain     HPI   [name] is a 65 y.o. female. Presents for left sided chest pain that started yesterday. She presented to the ED yesterday but left before being seen as she was tired and wanted to go home to sleep. She was given nitroglycerin in the ED waiting room prior to leaving and this resolved her pain. She again had chest pain today and when she called her primary care provider, she was directed to come to the ED for evaluation. She was given nitroglycerin again which resolved her pain. She has shortness of breath and numbness in her left hand associated with her pain. But these have now resolved as her chest pain improved with nitro. She had palpitations this AM.  She does have a pace maker in place but did not download any data from today Otherwise she denies headache, dizziness, nausea, emesis, diarrhea, fever, recent illness She denies etoh, cigarrette use, drug use   Past Medical History:  Diagnosis Date  . AICD (automatic cardioverter/defibrillator) present    [employer]- Dr. Beckie Salts follows  . Anginal pain ([place])   . Anxiety   . Asthma   . Back pain    "pinched nerve-lower back" - Dr. Nelva Bush follows.  . Cervical dysplasia   . CHF (congestive heart failure) ([place])   . Chronic kidney disease    Dr. Posey Pronto follows.  . Complication of anesthesia    "I wake up during surgeries" (02/14/2013)  . Coronary artery disease   . Depression   . Fibroid   . Function kidney decreased   . GERD (gastroesophageal reflux disease)   . Hepatitis    Hepatitis A -college yrs"water source exposure"  . Hiatal hernia   . History of shingles    2-3 yrs ago last out break "around waist"  . History of stomach ulcers   . Hypertension   . ICD (implantable cardiac defibrillator) in place   . Iron deficiency anemia     . Ischemic cardiomyopathy    status post biventricular ICD placed by DR Edumunds who used to see Dr Melvern Banker here to establish  cardiovascular care.  . Migraines   . MVP (mitral valve prolapse)    Antibiotics not required for procedures  . Myocardial infarction ([place])    "I've had 2; the others they were able to catch before completing" (02/14/2013)  . Pacemaker   . Pneumonia 1950's & 1985  . Shortness of breath    "lying down flat; at times w/exertion" (02/14/2013). 10-06-15 exertion only..  . Stroke [facility])    "2 confirmed; 9 TIA's; results in dragging LLE; numbness in tip of tongue" (02/14/2013),10-06-15 right hand tends to be weaker when tired.  . Type II diabetes mellitus [facility])     Patient Active Problem List   Diagnosis Date Noted  . Female pelvic pain 06/07/2016  . Chronic diastolic heart failure ([place]) 03/22/2016  . Other complicated headache syndrome   . Facial numbness 01/01/2016  . Ischemic cardiomyopathy 01/28/2015  . Chest pain with moderate risk of acute coronary syndrome   . Mastodynia, female 05/14/2014  . Mass of breast, left 05/14/2014  . CVA (cerebral infarction) 11/11/2012  . HTN (hypertension) 11/11/2012  . DM (diabetes mellitus) ([place]) 11/11/2012  . Chronic renal insufficiency, stage III (moderate) 11/11/2012  .  TIA (transient ischemic attack) 11/11/2012  . Hemiplegia, unspecified, affecting nondominant side 11/11/2012  . History of shingles   . MVP (mitral valve prolapse)   . Asthma   . Anxiety   . Depression   . Hiatal hernia   . Cervical dysplasia   . Fibroid   . Biventricular implantable cardioverter-defibrillator in situ 06/18/2011  . Paroxysmal SVT (supraventricular tachycardia) ([place]) 06/18/2011  . H/O CABG 1998, multiple caths, cath 01/03/15 showed patent grafts 06/18/2011    Past Surgical History:  Procedure Laterality Date  . ABDOMINAL HYSTERECTOMY  1985   TAH   . BIV ICD GENERTAOR CHANGE OUT  06/2007; 04/2008   "2 lead initial placement, at [facility];  done at [facility], after developing CHF" (02/14/2013)  . BIV PACEMAKER GENERATOR CHANGE OUT N/A 01/29/2015   Procedure: BIV PACEMAKER GENERATOR CHANGE OUT;  Surgeon: Evans Lance, MD;  Location: [facility] CATH LAB;  Service: Cardiovascular;  Laterality: N/A;  . BREAST EXCISIONAL BIOPSY Left 01/2007; 06/2007; 03/2008   "benign" (02/14/2013)  . BREAST SURGERY    . CARDIAC CATHETERIZATION     "probably in the teens" (02/14/2013)  . CARDIAC DEFIBRILLATOR PLACEMENT    . COLONOSCOPY  ~ 2002  . COLONOSCOPY WITH PROPOFOL N/A 10/07/2015   Procedure: COLONOSCOPY WITH PROPOFOL;  Surgeon: Juanita Craver, MD;  Location: [facility] ENDOSCOPY;  Service: Endoscopy;  Laterality: N/A;  . CORONARY ANGIOPLASTY WITH STENT PLACEMENT     "started out w/5; bypass corrected some; 1 stent since the bypass" (02/14/2013)  . CORONARY ARTERY BYPASS GRAFT  ` 1998   LIMA-LAD, SVG-D1, SVG-PDA  . [place] OF UTERUS  1975 X 2; 1976; 1977  . INSERT / REPLACE / REMOVE PACEMAKER     biventricular defibrillator--06/10/ 2009  . LEFT HEART CATHETERIZATION WITH CORONARY/GRAFT ANGIOGRAM N/A 01/03/2015   Procedure: LEFT HEART CATHETERIZATION WITH Beatrix Fetters;  Surgeon: Sinclair Grooms, MD;  Location: [facility] CATH LAB;  Service: Cardiovascular;  Laterality: N/A;  . SUPRAVENTRICULAR TACHYCARDIA ABLATION  06/2007  . TEE WITHOUT CARDIOVERSION  11/14/2012   Procedure: TRANSESOPHAGEAL ECHOCARDIOGRAM (TEE);  Surgeon: Candee Furbish, MD;  Location:  [facility] ENDOSCOPY;  Service: Cardiovascular;  Laterality: N/A;    OB History    Gravida Para Term Preterm AB Living   7 2   2 5 1    SAB TAB Ectopic Multiple Live Births                   Home Medications    Prior to Admission medications   Medication Sig Start Date End Date Taking? Authorizing Provider  albuterol (PROVENTIL HFA;VENTOLIN HFA) 108 (90 BASE) MCG/ACT inhaler Inhale 2 puffs into the lungs every 4 (four) hours as needed for wheezing or shortness of breath.   Yes Historical Provider, MD    ALPRAZolam Duanne Moron) 0.5 MG tablet Take 0.5-1 tablets by mouth 3 (three) times daily. 1 mg in the morning then 0.5 mg at noon then 0.5 mg at bedtime 09/09/15  Yes Historical Provider, MD  Artificial Tear GEL Place 2 drops into both eyes daily as needed (dry eyes).    Yes Historical Provider, MD  aspirin 325 MG EC tablet Take 325 mg by mouth daily.     Yes Historical Provider, MD  cetirizine (ZYRTEC) 10 MG tablet Take 10 mg by mouth daily.   Yes Historical Provider, MD  Cholecalciferol (VITAMIN D-3) 1000 UNITS CAPS Take 1 capsule by mouth daily.    Yes Historical Provider, MD  clopidogrel (PLAVIX) 75 MG tablet Take 1  tablet by mouth  daily 03/09/16  Yes Belva Crome, MD  cyclobenzaprine (FLEXERIL) 5 MG tablet Take 1 tablet (5 mg total) by mouth at bedtime as needed for muscle spasms. 01/04/16  Yes Oswald Hillock, MD  doxazosin (CARDURA) 4 MG tablet Take 4 mg by mouth at bedtime.    Yes Historical Provider, MD  fluticasone (FLONASE) 50 MCG/ACT nasal spray Place 2 sprays into both nostrils daily as needed for allergies or rhinitis.    Yes Historical Provider, MD  furosemide (LASIX) 80 MG tablet Take 1 tablet by mouth two  times daily. May take an additional 80mg  daily as needed for edema. Patient taking differently: Take 160 mg by mouth every morning.  03/22/16  Yes Jolaine Artist, MD  Insulin Glargine (LANTUS SOLOSTAR) 100 UNIT/ML Solostar Pen Inject 18 Units into the skin at bedtime.   Yes Historical Provider, MD  isosorbide mononitrate (IMDUR) 30 MG 24 hr tablet Take 1 tablet by mouth  daily Patient taking differently: Take 30 mg by mouth in the morning 02/05/16  Yes Belva Crome, MD  metoprolol (LOPRESSOR) 100 MG tablet Take 1 tablet by mouth two  times daily 03/09/16  Yes Belva Crome, MD  Multiple Vitamin (MULTIVITAMIN WITH MINERALS) TABS Take 1 tablet by mouth daily.   Yes Historical Provider, MD  NITROSTAT 0.4 MG SL tablet Place 1 tablet under the tongue every 5 minutes as needed for chest pain,  max 3 doses, go to er if no relief 11/18/14  Yes Belva Crome, MD  ondansetron (ZOFRAN ODT) 4 MG disintegrating tablet Take 1 tablet (4 mg total) by mouth every 8 (eight) hours as needed for nausea or vomiting. 12/18/15  Yes Everlene Balls, MD  pantoprazole (PROTONIX) 40 MG tablet Take 40 mg by mouth daily.   Yes Historical Provider, MD  potassium chloride (K-DUR) 10 MEQ tablet Take 2 tablets (20 mEq total) by mouth 2 (two) times daily. 11/26/13  Yes Belva Crome, MD  pregabalin (LYRICA) 50 MG capsule Take 100 mg by mouth at bedtime.    Yes Historical Provider, MD  ramipril (ALTACE) 10 MG capsule Take 1 capsule by mouth  twice daily Patient taking differently: Take 10 mg by mouth two times a day 05/17/16  Yes Belva Crome, MD  Riboflavin 100 MG TABS Take 2 tablets (200 mg total) by mouth daily. 01/04/16  Yes Oswald Hillock, MD  simvastatin (ZOCOR) 20 MG tablet Take 1 tablet by mouth at  bedtime 10/06/15  Yes Belva Crome, MD  sodium chloride (MURO 128) 5 % ophthalmic ointment Place 1 drop into both eyes at bedtime as needed. For dry eyes   Yes Historical Provider, MD  traMADol (ULTRAM) 50 MG tablet Take 100 mg by mouth every 6 (six) hours as needed (MIGRAINES).   Yes Historical Provider, MD  venlafaxine (EFFEXOR) 100 MG tablet Take 100-200 mg by mouth See admin instructions. 200 mg in the morning then 100 mg at noon 04/12/11  Yes Historical Provider, MD  ZETIA 10 MG tablet Take 1 tablet by mouth  daily 03/09/16  Yes Belva Crome, MD  Insulin Glargine [facility]) 100 UNIT/ML SOPN Inject 18 Units into the skin at bedtime. Reported on 02/18/2016    Historical Provider, MD  potassium chloride (K-DUR,KLOR-CON) 10 MEQ tablet Take 2 tablets by mouth two times daily Patient not taking: Reported on 07/30/2016 05/17/16   Belva Crome, MD    Family History Family History  Problem Relation  Age of Onset  . Heart disease Mother   . Hypertension Mother   . Heart attack Mother   . Stroke Mother   . Heart disease  Father   . Hypertension Father   . Diabetes Father   . Kidney failure Father   . Heart attack Father   . Stroke Father   . Heart disease Brother   . Diabetes Brother   . Kidney failure Brother   . Heart attack Brother   . Diabetes Paternal Grandmother     Social History Social History  Substance Use Topics  . Smoking status: Never Smoker  . Smokeless tobacco: Never Used  . Alcohol use No     Allergies   Calcium channel blockers; Codeine; Other; Ticlid [ticlopidine hcl]; Morphine and related; Tape; Digoxin and related; Metolazone; Milk-related compounds; Myrbetriq [mirabegron]; Onglyza [saxagliptin]; Penicillins; Spironolactone; Sulfa antibiotics; Tetracyclines & related; Vicodin [hydrocodone-acetaminophen]; and Latex   Review of Systems Review of Systems  Constitutional: Negative.   HENT: Negative.   Respiratory: Positive for shortness of breath.   Cardiovascular: Positive for chest pain.  Gastrointestinal: Negative.   Genitourinary: Negative.   Neurological: Positive for numbness.       Left arm numbness     Physical Exam Updated Vital Signs BP 133/84   Pulse 68   Temp 98.5 F (36.9 C) (Oral)   Resp 12   SpO2 95%   Physical Exam  Constitutional: She is oriented to person, place, and time. She appears well-developed and well-nourished.  Eyes: EOM are normal. Pupils are equal, round, and reactive to light.  Cardiovascular: Normal rate, regular rhythm and normal heart sounds.   No murmur heard. Pulmonary/Chest: Effort normal and breath sounds normal. No respiratory distress.  Abdominal: Soft. Bowel sounds are normal. She exhibits no distension. There is no tenderness.  Musculoskeletal: She exhibits no edema.  Neurological: She is alert and oriented to person, place, and time.  Skin: Skin is warm.  Psychiatric: She has a normal mood and affect.     ED Treatments / Results  Labs (all labs ordered are listed, but only abnormal results are displayed) Labs  Reviewed  BASIC METABOLIC PANEL - Abnormal; Notable for the following:       Result Value   CO2 33 (*)    Glucose, Bld 133 (*)    Creatinine, Ser 1.59 (*)    GFR calc non Af Amer 33 (*)    GFR calc Af Amer 39 (*)    All other components within normal limits  CBC - Abnormal; Notable for the following:    Hemoglobin 11.1 (*)    HCT 34.0 (*)    All other components within normal limits  TROPONIN I    EKG  EKG Interpretation None       Radiology Dg Chest 2 View  Result Date: 07/29/2016 CLINICAL DATA:  Mid chest pain and dyspnea EXAM: CHEST  2 VIEW COMPARISON:  12/18/2015 FINDINGS: The heart is top-normal in size. The aorta is not aneurysmal. The patient is status post median sternotomy with evidence of prior CABG. The ICD device projects over the left anterior chest wall with leads noted in the right atrium, right ventricle and coronary sinus. Lungs are clear apart from some minimal atelectasis at the left lung base. No effusion or pneumothorax. No acute osseous abnormality. IMPRESSION: No active cardiopulmonary disease. Electronically Signed   By: Ashley Royalty M.D.   On: 07/29/2016 12:55    Procedures Procedures (including critical care time)  Medications  Ordered in ED Medications  nitroGLYCERIN (NITROSTAT) SL tablet 0.4 mg (not administered)     Initial Impression / Assessment and Plan / ED Course  I have reviewed the triage vital signs and the nursing notes.  Pertinent labs & imaging results that were available during my care of the patient were reviewed by me and considered in my medical decision making (see chart for details).  Clinical Course    Uncomplicated ED course  Final Clinical Impressions(s) / ED Diagnoses   Final diagnoses:  Other chest pain   65 y/o with PMH significant for history of MI, stroke who presents for chest pain. Heart score 5. Her symptoms resoved after being given nitro x1. She remained hemodynamically stable and given her significant  cardiac history she was admitted to the medicine service for further monitoring and consideration of further cardiac work up  [place] Prescriptions   No medications on file     Veatrice Bourbon, MD 07/30/16 2032    Elnora Morrison, MD 08/02/16 [phone]

## 2016-07-30 NOTE — Telephone Encounter (Signed)
Called to check on pt. Pt sts that she is doing feeling well and is currently having cheat pain 6 out 10 >4min. She had not taken any Nitro. Pt husband is home with her, adv her per Dr.Smith she should go to the ED and she should wait to be evaluated, there is concern that she could be having a cardiac event. Pt sts that she will go to the [facility] ED now

## 2016-07-30 NOTE — ED Notes (Signed)
Patient transported to X-ray 

## 2016-07-30 NOTE — ED Notes (Signed)
MD at bedside. 

## 2016-07-30 NOTE — ED Notes (Signed)
Pt reported to this nurse that she took one SL nitro tab while in waiting room.

## 2016-07-31 DIAGNOSIS — I25119 Atherosclerotic heart disease of native coronary artery with unspecified angina pectoris: Secondary | ICD-10-CM | POA: Diagnosis not present

## 2016-07-31 DIAGNOSIS — I130 Hypertensive heart and chronic kidney disease with heart failure and stage 1 through stage 4 chronic kidney disease, or unspecified chronic kidney disease: Secondary | ICD-10-CM | POA: Diagnosis not present

## 2016-07-31 DIAGNOSIS — F419 Anxiety disorder, unspecified: Secondary | ICD-10-CM

## 2016-07-31 DIAGNOSIS — R072 Precordial pain: Secondary | ICD-10-CM | POA: Diagnosis not present

## 2016-07-31 DIAGNOSIS — I5042 Chronic combined systolic (congestive) and diastolic (congestive) heart failure: Secondary | ICD-10-CM | POA: Diagnosis not present

## 2016-07-31 DIAGNOSIS — Z95810 Presence of automatic (implantable) cardiac defibrillator: Secondary | ICD-10-CM | POA: Diagnosis not present

## 2016-07-31 DIAGNOSIS — Z951 Presence of aortocoronary bypass graft: Secondary | ICD-10-CM | POA: Diagnosis not present

## 2016-07-31 DIAGNOSIS — R079 Chest pain, unspecified: Secondary | ICD-10-CM | POA: Diagnosis not present

## 2016-07-31 DIAGNOSIS — N183 Chronic kidney disease, stage 3 (moderate): Secondary | ICD-10-CM | POA: Diagnosis not present

## 2016-07-31 DIAGNOSIS — E1122 Type 2 diabetes mellitus with diabetic chronic kidney disease: Secondary | ICD-10-CM | POA: Diagnosis not present

## 2016-07-31 DIAGNOSIS — E1140 Type 2 diabetes mellitus with diabetic neuropathy, unspecified: Secondary | ICD-10-CM | POA: Diagnosis not present

## 2016-07-31 DIAGNOSIS — R002 Palpitations: Secondary | ICD-10-CM | POA: Diagnosis not present

## 2016-07-31 DIAGNOSIS — I2510 Atherosclerotic heart disease of native coronary artery without angina pectoris: Secondary | ICD-10-CM | POA: Diagnosis not present

## 2016-07-31 DIAGNOSIS — F329 Major depressive disorder, single episode, unspecified: Secondary | ICD-10-CM | POA: Diagnosis not present

## 2016-07-31 LAB — GLUCOSE, CAPILLARY
GLUCOSE-CAPILLARY: 117 mg/dL — AB (ref 65–99)
GLUCOSE-CAPILLARY: 159 mg/dL — AB (ref 65–99)
Glucose-Capillary: 89 mg/dL (ref 65–99)
Glucose-Capillary: 214 mg/dL — ABNORMAL HIGH (ref 65–99)

## 2016-07-31 LAB — BASIC METABOLIC PANEL
ANION GAP: 13 (ref 5–15)
BUN: 10 mg/dL (ref 6–20)
CHLORIDE: 100 mmol/L — AB (ref 101–111)
CO2: 32 mmol/L (ref 22–32)
Calcium: 9.3 mg/dL (ref 8.9–10.3)
Creatinine, Ser: 1.5 mg/dL — ABNORMAL HIGH (ref 0.44–1.00)
GFR calc Af Amer: 42 mL/min — ABNORMAL LOW (ref >60)
GFR calc non Af Amer: 36 mL/min — ABNORMAL LOW (ref >60)
GLUCOSE: 93 mg/dL (ref 65–99)
POTASSIUM: 3.9 mmol/L (ref 3.5–5.1)
Sodium: 145 mmol/L (ref 135–145)

## 2016-07-31 LAB — CBC
HEMATOCRIT: 33.7 % — AB (ref 36.0–46.0)
HEMOGLOBIN: 11 g/dL — AB (ref 12.0–15.0)
MCH: 28.1 pg (ref 26.0–34.0)
MCHC: 32.6 g/dL (ref 30.0–36.0)
MCV: 86.2 fL (ref 78.0–100.0)
Platelets: 161 10*3/uL (ref 150–400)
RBC: 3.91 MIL/uL (ref 3.87–5.11)
RDW: 13.4 % (ref 11.5–15.5)
WBC: 7.2 10*3/uL (ref 4.0–10.5)

## 2016-07-31 LAB — TROPONIN I: Troponin I: <0.03 ng/mL (ref <0.03)

## 2016-07-31 NOTE — Progress Notes (Signed)
[facility] PROGRESS NOTE  [name] GUR:[id number] DOB: [birth year] DOA: 07/30/2016  PCP: Reginia Naas, MD  Brief History/Interval Summary: 65 year old African-American female with a past medical history of coronary artery disease status post CABG and stent placement, combined systolic and diastolic CHF, status post AICD, chronic kidney disease, unknown stage, anxiety, asthma, presented to the emergency department with complaints of chest pain. Her symptoms have been present on and off since Thursday. Usually relieved with nitroglycerin. She was hospitalized for further management.  Reason for Visit: Chest pain  Consultants: Cardiology  Procedures: None yet  Antibiotics: None  Subjective/Interval History: Patient feels well this morning. Has not had any chest pain since last night. Denies any shortness of breath.  Did feel lightheaded with her symptoms yesterday and the day before.  ROS: No nausea, vomiting.  Objective:  Vital Signs  Vitals:   07/31/16 0030 07/31/16 0523 07/31/16 0902 07/31/16 1215  BP: (!) 161/83 134/68 (!) 147/76 (!) 144/91  Pulse:  73 68 67  Resp:  16  19  Temp:  98.3 F (36.8 C)  97.7 F (36.5 C)  TempSrc:  Oral  Axillary  SpO2:  96%  99%  Weight:      Height:        Intake/Output Summary (Last 24 hours) at 07/31/16 1516 Last data filed at 07/31/16 1300  Gross per 24 hour  Intake              960 ml  Output             1600 ml  Net             -640 ml   Filed Weights   07/30/16 2239 07/31/16 0029  Weight: 80.2 kg (176 lb 11.2 oz) 80.3 kg (177 lb)    General appearance: alert, cooperative, appears stated age and no distress Resp: clear to auscultation bilaterally Cardio: regular rate and rhythm, S1, S2 normal, no murmur, click, rub or gallop GI: soft, non-tender; bowel sounds normal; no masses,  no organomegaly Extremities: extremities normal, atraumatic, no cyanosis or edema Awake and alert. Oriented 3. Motor  strength equal bilateral upper and lower extremities. No cranial nerve deficits.  Lab Results:  Data Reviewed: I have personally reviewed following labs and imaging studies  CBC:  Recent Labs Lab 07/29/16 1233 07/30/16 1628 07/31/16 0522  WBC 7.4 7.9 7.2  HGB 11.3* 11.1* 11.0*  HCT 35.2* 34.0* 33.7*  MCV 86.9 85.9 86.2  PLT 162 170 283    Basic Metabolic Panel:  Recent Labs Lab 07/29/16 1233 07/30/16 1628 07/31/16 0522  NA 142 141 145  K 4.1 3.8 3.9  CL 102 101 100*  CO2 28 33* 32  GLUCOSE 118* 133* 93  BUN 9 9 10   CREATININE 1.48* 1.59* 1.50*  CALCIUM 9.2 9.4 9.3    GFR: Estimated Creatinine Clearance: 39.3 mL/min (by C-G formula based on SCr of 1.5 mg/dL (H)).  Cardiac Enzymes:  Recent Labs Lab 07/30/16 1628 07/30/16 2205 07/31/16 0522  TROPONINI <0.03 <0.03 <0.03    CBG:  Recent Labs Lab 07/30/16 2208 07/31/16 0626 07/31/16 1212  GLUCAP 98 89 117*    Radiology Studies: No results found.   Medications:  Scheduled: . ALPRAZolam  0.25-0.5 mg Oral TID  . aspirin  325 mg Oral Daily  . cholecalciferol  1,000 Units Oral Daily  . clopidogrel  75 mg Oral Daily  . doxazosin  4 mg Oral QHS  . enoxaparin (LOVENOX) injection  40  mg Subcutaneous Q24H  . ezetimibe  10 mg Oral Daily  . furosemide  160 mg Oral q morning - 10a  . insulin glargine  18 Units Subcutaneous QHS  . isosorbide mononitrate  30 mg Oral Daily  . loratadine  10 mg Oral Daily  . metoprolol  100 mg Oral BID  . multivitamin with minerals  1 tablet Oral Daily  . pantoprazole  40 mg Oral Daily  . potassium chloride  20 mEq Oral BID  . pregabalin  100 mg Oral QHS  . ramipril  10 mg Oral BID  . simvastatin  20 mg Oral QHS  . sodium chloride flush  3 mL Intravenous Q12H  . venlafaxine  200 mg Oral q morning - 10a   And  . venlafaxine  100 mg Oral Q1200   Continuous:  CBS:[id number], albuterol, fluticasone, nitroGLYCERIN, ondansetron (ZOFRAN) IV, sodium chloride,  traMADol  Assessment/Plan:  Principal Problem:   Chest pain Active Problems:   Asthma   Anxiety   Depression   HTN (hypertension)   DM (diabetes mellitus) (HCC)   Chronic diastolic heart failure (HCC)   Hyperlipidemia    Chest pain in the setting of known history of coronary artery disease. Patient has a known history of CAD and is status post CABG as well as stent placements in the past. Her symptoms sound atypical, but were relieved with nitroglycerin. Patient underwent a cardiac catheterization in February 2016 which showed revealed widely patent bypass grafts. She was found to have totally occluded mid RCA, totally occluded diagonal #1 and diffuse disease in the mid LAD. Her symptoms at that time were thought to be due to microvascular vasoconstriction issues. Medical management was recommended. She does have a paced rhythm on EKG. Due to the complexity of her cardiac history and on and off nature of her symptoms for the last 48 hours, we will go ahead and consult cardiology to assist with management. Continue her aspirin and Plavix. She has ruled out for ACS.  History of chronic systolic and diastolic congestive heart failure s/p AICD Her ejection fraction is 45-50% based on echocardiogram done earlier this year. She does have grade 2 diastolic dysfunction. She appears to be euvolemic at this time. Continue her home medications. She is on a beta blocker and she is also on ACE inhibitor.  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 Monitor CBGs. She is on Lantus at home which is being continued.  Chronic kidney disease appears to be stage III. Renal function at baseline. Continue to monitor urine output.  History of anxiety and depression. Continue home medications.  History of diabetic neuropathy. Continue with pregabalin  DVT Prophylaxis: Lovenox    Code Status: Full code  Family Communication: Discussed with the patient  Disposition Plan: Await  cardiology input.    LOS: 0 days   [facility] Pager [phone] 07/31/2016, 3:16 PM  If 7PM-7AM, please contact night-coverage at [email or web address], password [facility]

## 2016-07-31 NOTE — Consult Note (Addendum)
Advanced Heart Failure Team Consult Note  Referring Physician: Curly Rim Primary Cardiologist:  Hank Smith/Bensimhon  Reason for Consultation: Chest pain  HPI:    MOZEL BURDETT is a 65 y.o. female with medical history of CAD s/p CABG 1998, CHF, ischemic cardiomyopathy s/p Medtronic CRT-D upgrade in 02/2013 (EF 45-50% by echo 2/17) , mitral valve disease with moderate regurgitation, diabetes mellitus, and ischemic. We are asked to see due to CP.  She is also followed by Dr Tamala Julian.   Last cath 01/03/15 1. Multi-vessel coronary artery disease. The left main coronary artery is patent without any significant obstruction.  The left anterior descending artery is diffusely diseased in the proximal and mid segment. Competitive flow is noted in the mid and distal LAD. First agonal is severely diseased.  The left circumflex artery is patent. 4 obtuse marginal branches are noted. The distal circumflex before the fourth marginal is diffusely disease. Otherwise luminal irregularities are noted throughout..  The right coronary artery is totally occluded in the mid vessel.. 2. Status post coronary artery bypass grafting.  a. Patent LIMA to LAD.  b. Patent saphenous vein graft to diagonal.  c. Patent vein graft to PDA and retrograde filling of  posterolateral branch.  On 9/21 she developed precordial sharp pain radiated to the left arm associated with mild dyspnea and lightheadedness while lying in bed it lasted about 3 seconds. Had several brief episodes and finally took NTG and felt better. She came to ER but left before she was evaluated. Today she had recurrent CP associated palpitations. Again, very brief episodes. Troponin and ECG ok. Denies exertion dyspnea or CP.     Trop < 0.03 x 3 ECG NSR with v-pacing 70.    Review of Systems: [y] = yes, [ ]  = no   General: Weight gain [ ] ; Weight loss [ ] ; Anorexia [ ] ; Fatigue Blue.Reese ]; Fever [ ] ; Chills [ ] ;  Weakness [ ]   Cardiac: Chest pain/pressure Blue.Reese ]; Resting SOB [ ] ; Exertional SOB [ ] ; Orthopnea [ ] ; Pedal Edema [ ] ; Palpitations [ ] ; Syncope [ ] ; Presyncope [ ] ; Paroxysmal nocturnal dyspnea[ ]   Pulmonary: Cough [ ] ; Wheezing[ ] ; Hemoptysis[ ] ; Sputum [ ] ; Snoring [ ]   GI: Vomiting[ ] ; Dysphagia[ ] ; Melena[ ] ; Hematochezia [ ] ; Heartburn[ ] ; Abdominal pain [ ] ; Constipation [ ] ; Diarrhea [ ] ; BRBPR [ ]   GU: Hematuria[ ] ; Dysuria [ ] ; Nocturia[ ]   Vascular: Pain in legs with walking [ ] ; Pain in feet with lying flat [ ] ; Non-healing sores [ ] ; Stroke [ ] ; TIA [ ] ; Slurred speech [ ] ;  Neuro: Headaches[ ] ; Vertigo[ ] ; Seizures[ ] ; Paresthesias[ ] ;Blurred vision [ ] ; Diplopia [ ] ; Vision changes [ ]   Ortho/Skin: Arthritis y[ ] ; Joint pain [ ] ; Muscle pain [ ] ; Joint swelling [ ] ; Back Pain [ ] ; Rash [ ]   Psych: Depression[ ] ; Anxiety[ ]   Heme: Bleeding problems [ ] ; Clotting disorders [ ] ; Anemia [ ]   Endocrine: Diabetes [ ] ; Thyroid dysfunction[ ]   Home Medications Prior to Admission medications   Medication Sig Start Date End Date Taking? Authorizing Provider  albuterol (PROVENTIL HFA;VENTOLIN HFA) 108 (90 BASE) MCG/ACT inhaler Inhale 2 puffs into the lungs every 4 (four) hours as needed for wheezing or shortness of breath.   Yes Historical Provider, MD  ALPRAZolam Duanne Moron) 0.5 MG tablet Take 0.5-1 tablets by mouth 3 (three) times daily. 1 mg in the morning then 0.5 mg at noon then 0.5  mg at bedtime 09/09/15  Yes Historical Provider, MD  Artificial Tear GEL Place 2 drops into both eyes daily as needed (dry eyes).    Yes Historical Provider, MD  aspirin 325 MG EC tablet Take 325 mg by mouth daily.     Yes Historical Provider, MD  cetirizine (ZYRTEC) 10 MG tablet Take 10 mg by mouth daily.   Yes Historical Provider, MD  Cholecalciferol (VITAMIN D-3) 1000 UNITS CAPS Take 1 capsule by mouth daily.    Yes Historical Provider, MD  clopidogrel (PLAVIX) 75 MG tablet Take 1 tablet by mouth  daily 03/09/16   Yes Belva Crome, MD  cyclobenzaprine (FLEXERIL) 5 MG tablet Take 1 tablet (5 mg total) by mouth at bedtime as needed for muscle spasms. 01/04/16  Yes Oswald Hillock, MD  doxazosin (CARDURA) 4 MG tablet Take 4 mg by mouth at bedtime.    Yes Historical Provider, MD  fluticasone (FLONASE) 50 MCG/ACT nasal spray Place 2 sprays into both nostrils daily as needed for allergies or rhinitis.    Yes Historical Provider, MD  furosemide (LASIX) 80 MG tablet Take 1 tablet by mouth two  times daily. May take an additional 80mg  daily as needed for edema. Patient taking differently: Take 160 mg by mouth every morning.  03/22/16  Yes Jolaine Artist, MD  Insulin Glargine (LANTUS SOLOSTAR) 100 UNIT/ML Solostar Pen Inject 18 Units into the skin at bedtime.   Yes Historical Provider, MD  isosorbide mononitrate (IMDUR) 30 MG 24 hr tablet Take 1 tablet by mouth  daily Patient taking differently: Take 30 mg by mouth in the morning 02/05/16  Yes Belva Crome, MD  metoprolol (LOPRESSOR) 100 MG tablet Take 1 tablet by mouth two  times daily 03/09/16  Yes Belva Crome, MD  Multiple Vitamin (MULTIVITAMIN WITH MINERALS) TABS Take 1 tablet by mouth daily.   Yes Historical Provider, MD  NITROSTAT 0.4 MG SL tablet Place 1 tablet under the tongue every 5 minutes as needed for chest pain, max 3 doses, go to er if no relief 11/18/14  Yes Belva Crome, MD  ondansetron (ZOFRAN ODT) 4 MG disintegrating tablet Take 1 tablet (4 mg total) by mouth every 8 (eight) hours as needed for nausea or vomiting. 12/18/15  Yes Everlene Balls, MD  pantoprazole (PROTONIX) 40 MG tablet Take 40 mg by mouth daily.   Yes Historical Provider, MD  potassium chloride (K-DUR) 10 MEQ tablet Take 2 tablets (20 mEq total) by mouth 2 (two) times daily. 11/26/13  Yes Belva Crome, MD  pregabalin (LYRICA) 50 MG capsule Take 100 mg by mouth at bedtime.    Yes Historical Provider, MD  ramipril (ALTACE) 10 MG capsule Take 1 capsule by mouth  twice daily Patient taking  differently: Take 10 mg by mouth two times a day 05/17/16  Yes Belva Crome, MD  Riboflavin 100 MG TABS Take 2 tablets (200 mg total) by mouth daily. 01/04/16  Yes Oswald Hillock, MD  simvastatin (ZOCOR) 20 MG tablet Take 1 tablet by mouth at  bedtime 10/06/15  Yes Belva Crome, MD  sodium chloride (MURO 128) 5 % ophthalmic ointment Place 1 drop into both eyes at bedtime as needed. For dry eyes   Yes Historical Provider, MD  traMADol (ULTRAM) 50 MG tablet Take 100 mg by mouth every 6 (six) hours as needed (MIGRAINES).   Yes Historical Provider, MD  venlafaxine (EFFEXOR) 100 MG tablet Take 100-200 mg by mouth See admin instructions.  200 mg in the morning then 100 mg at noon 04/12/11  Yes Historical Provider, MD  ZETIA 10 MG tablet Take 1 tablet by mouth  daily 03/09/16  Yes Belva Crome, MD  Insulin Glargine [facility]) 100 UNIT/ML SOPN Inject 18 Units into the skin at bedtime. Reported on 02/18/2016    Historical Provider, MD  potassium chloride (K-DUR,KLOR-CON) 10 MEQ tablet Take 2 tablets by mouth two times daily Patient not taking: Reported on 07/30/2016 05/17/16   Belva Crome, MD    Past Medical History: Past Medical History:  Diagnosis Date  . AICD (automatic cardioverter/defibrillator) present    Medtronic- Dr. Beckie Salts follows  . Anginal pain ([place])   . Anxiety   . Asthma   . Back pain    "pinched nerve-lower back" - Dr. Nelva Bush follows.  . Cervical dysplasia   . CHF (congestive heart failure) ([place])   . Chronic kidney disease    Dr. Posey Pronto follows.  . Complication of anesthesia    "I wake up during surgeries" (02/14/2013)  . Coronary artery disease   . Depression   . Fibroid   . Function kidney decreased   . GERD (gastroesophageal reflux disease)   . Hepatitis    Hepatitis A -college yrs"water source exposure"  . Hiatal hernia   . History of shingles    2-3 yrs ago last out break "around waist"  . History of stomach ulcers   . Hypertension   . ICD (implantable cardiac  defibrillator) in place   . Iron deficiency anemia   . Ischemic cardiomyopathy    status post biventricular ICD placed by DR Edumunds who used to see Dr Melvern Banker here to establish  cardiovascular care.  . Migraines   . MVP (mitral valve prolapse)    Antibiotics not required for procedures  . Myocardial infarction ([place])    "I've had 2; the others they were able to catch before completing" (02/14/2013)  . Pacemaker   . Pneumonia 1950's & 1985  . Shortness of breath    "lying down flat; at times w/exertion" (02/14/2013). 10-06-15 exertion only..  . Stroke [facility])    "2 confirmed; 9 TIA's; results in dragging LLE; numbness in tip of tongue" (02/14/2013),10-06-15 right hand tends to be weaker when tired.  . Type II diabetes mellitus ([place])     Past Surgical History: Past Surgical History:  Procedure Laterality Date  . ABDOMINAL HYSTERECTOMY  1985   TAH   . BIV ICD GENERTAOR CHANGE OUT  06/2007; 04/2008   "2 lead initial placement, at [facility]; done at [facility], after developing CHF" (02/14/2013)  . BIV PACEMAKER GENERATOR CHANGE OUT N/A 01/29/2015   Procedure: BIV PACEMAKER GENERATOR CHANGE OUT;  Surgeon: Evans Lance, MD;  Location: [facility] CATH LAB;  Service: Cardiovascular;  Laterality: N/A;  . BREAST EXCISIONAL BIOPSY Left 01/2007; 06/2007; 03/2008   "benign" (02/14/2013)  . BREAST SURGERY    . CARDIAC CATHETERIZATION     "probably in the teens" (02/14/2013)  . CARDIAC DEFIBRILLATOR PLACEMENT    . COLONOSCOPY  ~ 2002  . COLONOSCOPY WITH PROPOFOL N/A 10/07/2015   Procedure: COLONOSCOPY WITH PROPOFOL;  Surgeon: Juanita Craver, MD;  Location: [facility] ENDOSCOPY;  Service: Endoscopy;  Laterality: N/A;  . CORONARY ANGIOPLASTY WITH STENT PLACEMENT     "started out w/5; bypass corrected some; 1 stent since the bypass" (02/14/2013)  . CORONARY ARTERY BYPASS GRAFT  ` 1998   LIMA-LAD, SVG-D1, SVG-PDA  . [place] OF UTERUS  1975 X  2; 1976; 1977  . INSERT / REPLACE / REMOVE PACEMAKER     biventricular  defibrillator--06/10/ 2009  . LEFT HEART CATHETERIZATION WITH CORONARY/GRAFT ANGIOGRAM N/A 01/03/2015   Procedure: LEFT HEART CATHETERIZATION WITH Beatrix Fetters;  Surgeon: Sinclair Grooms, MD;  Location: [facility] CATH LAB;  Service: Cardiovascular;  Laterality: N/A;  . SUPRAVENTRICULAR TACHYCARDIA ABLATION  06/2007  . TEE WITHOUT CARDIOVERSION  11/14/2012   Procedure: TRANSESOPHAGEAL ECHOCARDIOGRAM (TEE);  Surgeon: Candee Furbish, MD;  Location: [facility] ENDOSCOPY;  Service: Cardiovascular;  Laterality: N/A;    Family History: Family History  Problem Relation Age of Onset  . Heart disease Mother   . Hypertension Mother   . Heart attack Mother   . Stroke Mother   . Heart disease Father   . Hypertension Father   . Diabetes Father   . Kidney failure Father   . Heart attack Father   . Stroke Father   . Heart disease Brother   . Diabetes Brother   . Kidney failure Brother   . Heart attack Brother   . Diabetes Paternal Grandmother     Social History: Social History   Social History  . Marital status: Married    Spouse name: [name]  . Number of children: 1  . Years of education: MA   Occupational History  .  Unemployed    Disablity   Social History Main Topics  . Smoking status: Never Smoker  . Smokeless tobacco: Never Used  . Alcohol use No  . Drug use: No  . Sexual activity: Yes    Birth control/ protection: Surgical   Other Topics Concern  . None   Social History Narrative   Patient lives at home with spouse.     Daughter name is Tourist information centre manager.   Caffeine Use: none    Allergies:  Allergies  Allergen Reactions  . Calcium Channel Blockers Other (See Comments)    Cannot take non-DHP CCBs (diltiazem, verapamil) due to CHF requiring ICD  . Codeine Anaphylaxis  . Other Anaphylaxis    Walnuts, pecans, and ANY melons PATIENT IS A VEGAN  . Ticlid [Ticlopidine Hcl] Other (See Comments)    Unprovoked bleeding while on Ticlid (doesn't think she was on ASA at the time) Takes  Plavix without problems  . Morphine And Related Other (See Comments)    Severe AMS, confusion, weakness Tolerates Fentanyl, Tramadol  . Tape Dermatitis and Rash    Tolerates paper tape  . Digoxin And Related Nausea Only    Unknown  . Metolazone Other (See Comments)    Cannot remember reaction  . Milk-Related Compounds Other (See Comments)    Bad gas  . Myrbetriq [Mirabegron] Other (See Comments)    Aggravates migraines  . Onglyza [Saxagliptin] Other (See Comments)    Exacerbates migraines  . Penicillins Hives    Has patient had a PCN reaction causing immediate rash, facial/tongue/throat swelling, SOB or lightheadedness with hypotension: Yes Has patient had a PCN reaction causing severe rash involving mucus membranes or skin necrosis: Unknown Has patient had a PCN reaction that required hospitalization: Unknown Has patient had a PCN reaction occurring within the last 10 years: No If all of the above answers are "NO", then may proceed with Cephalosporin use.   [name] Spironolactone Other (See Comments)    Cannot remember reaction  . Sulfa Antibiotics Hives  . Tetracyclines & Related Other (See Comments)    Cannot remember reaction Tolerates macrolides (azithromycin)  . Vicodin [Hydrocodone-Acetaminophen] Other (See Comments)  EXTREME LETHARGY  . Latex Itching and Rash    Objective:    Vital Signs:   Temp:  [97.7 F (36.5 C)-98.5 F (36.9 C)] 97.7 F (36.5 C) (09/23 1215) Pulse Rate:  [65-73] 67 (09/23 1215) Resp:  [10-19] 19 (09/23 1215) BP: (113-161)/(68-97) 144/91 (09/23 1215) SpO2:  [95 %-99 %] 99 % (09/23 1215) Weight:  [80.2 kg (176 lb 11.2 oz)-80.3 kg (177 lb)] 80.3 kg (177 lb) (09/23 0029) Last BM Date: 07/30/16  Weight change: Filed Weights   07/30/16 2239 07/31/16 0029  Weight: 80.2 kg (176 lb 11.2 oz) 80.3 kg (177 lb)    Intake/Output:   Intake/Output Summary (Last 24 hours) at 07/31/16 1427 Last data filed at 07/31/16 1300  Gross per 24 hour  Intake               960 ml  Output             1600 ml  Net             -640 ml     Physical Exam: General:  Well appearing. No resp difficulty HEENT: normal Neck: supple. JVP flat . Carotids 2+ bilat; no bruits. No lymphadenopathy or thryomegaly appreciated. Cor: PMI nondisplaced. Regular rate & rhythm. No rubs, gallops or murmurs. Chest wall tender to palpation.  Lungs: clear Abdomen: obese soft, nontender, nondistended. No hepatosplenomegaly. No bruits or masses. Good bowel sounds. Extremities: no cyanosis, clubbing, rash, edema Neuro: alert & orientedx3, cranial nerves grossly intact. moves all 4 extremities w/o difficulty. Affect pleasant  Telemetry:  NSR with v-pacing  Labs: Basic Metabolic Panel:  Recent Labs Lab 07/29/16 1233 07/30/16 1628 07/31/16 0522  NA 142 141 145  K 4.1 3.8 3.9  CL 102 101 100*  CO2 28 33* 32  GLUCOSE 118* 133* 93  BUN 9 9 10   CREATININE 1.48* 1.59* 1.50*  CALCIUM 9.2 9.4 9.3    Liver Function Tests: No results for input(s): AST, ALT, ALKPHOS, BILITOT, PROT, ALBUMIN in the last 168 hours. No results for input(s): LIPASE, AMYLASE in the last 168 hours. No results for input(s): AMMONIA in the last 168 hours.  CBC:  Recent Labs Lab 07/29/16 1233 07/30/16 1628 07/31/16 0522  WBC 7.4 7.9 7.2  HGB 11.3* 11.1* 11.0*  HCT 35.2* 34.0* 33.7*  MCV 86.9 85.9 86.2  PLT 162 170 161    Cardiac Enzymes:  Recent Labs Lab 07/30/16 1628 07/30/16 2205 07/31/16 0522  TROPONINI <0.03 <0.03 <0.03    BNP: BNP (last 3 results) No results for input(s): BNP in the last 8760 hours.  ProBNP (last 3 results) No results for input(s): PROBNP in the last 8760 hours.   CBG:  Recent Labs Lab 07/30/16 2208 07/31/16 0626 07/31/16 1212  GLUCAP 98 89 117*    Coagulation Studies: No results for input(s): LABPROT, INR in the last 72 hours.  Other results: EKG: per HPI  Imaging:  No results found.   Medications:     Current Medications: .  ALPRAZolam  0.25-0.5 mg Oral TID  . aspirin  325 mg Oral Daily  . cholecalciferol  1,000 Units Oral Daily  . clopidogrel  75 mg Oral Daily  . doxazosin  4 mg Oral QHS  . enoxaparin (LOVENOX) injection  40 mg Subcutaneous Q24H  . ezetimibe  10 mg Oral Daily  . furosemide  160 mg Oral q morning - 10a  . insulin glargine  18 Units Subcutaneous QHS  . isosorbide mononitrate  30 mg Oral  Daily  . loratadine  10 mg Oral Daily  . metoprolol  100 mg Oral BID  . multivitamin with minerals  1 tablet Oral Daily  . pantoprazole  40 mg Oral Daily  . potassium chloride  20 mEq Oral BID  . pregabalin  100 mg Oral QHS  . ramipril  10 mg Oral BID  . simvastatin  20 mg Oral QHS  . sodium chloride flush  3 mL Intravenous Q12H  . venlafaxine  200 mg Oral q morning - 10a   And  . venlafaxine  100 mg Oral Q1200     Infusions:      Assessment:   1. Chest pain 2. Palpitations 3. CAD s/p CABG 1998    --last cath 2/16 severe native CAD with all grafts patent 4. Chronic systolic HF s/p MDT CRT-D    --EF 45-50% by echo 2/17  5. CKD stage III, stable   Plan/Discussion:    Chest pain quite atypical but some response to NTG. Troponin negative x 3. Recent cath with stable revascularization. Doubt myocardial ischemia but will order lexiscan myoview given frequent recurrence. Would only cath if high-grade ischemia. I suspect anxiety a big factor here  Will interrogate ICD to assess palpitations.   HF is stable continue current regimen.     Length of Stay: 0  Glori Bickers MD 07/31/2016, 2:27 PM  Advanced Heart Failure Team Pager [phone] (M-F; 7a - 4p)  Please contact [facility] for night-coverage after hours (4p -7a ) and weekends on [email or web address]

## 2016-08-01 ENCOUNTER — Observation Stay (HOSPITAL_BASED_OUTPATIENT_CLINIC_OR_DEPARTMENT_OTHER): Payer: Medicare Other

## 2016-08-01 ENCOUNTER — Observation Stay (HOSPITAL_COMMUNITY): Payer: Medicare Other

## 2016-08-01 DIAGNOSIS — R002 Palpitations: Secondary | ICD-10-CM | POA: Diagnosis not present

## 2016-08-01 DIAGNOSIS — I5032 Chronic diastolic (congestive) heart failure: Secondary | ICD-10-CM | POA: Diagnosis not present

## 2016-08-01 DIAGNOSIS — R0789 Other chest pain: Secondary | ICD-10-CM

## 2016-08-01 DIAGNOSIS — R079 Chest pain, unspecified: Secondary | ICD-10-CM

## 2016-08-01 DIAGNOSIS — I25119 Atherosclerotic heart disease of native coronary artery with unspecified angina pectoris: Secondary | ICD-10-CM | POA: Diagnosis not present

## 2016-08-01 DIAGNOSIS — R072 Precordial pain: Secondary | ICD-10-CM | POA: Diagnosis not present

## 2016-08-01 LAB — NM MYOCAR MULTI W/SPECT W/WALL MOTION / EF
CHL CUP MPHR: 157 {beats}/min
CSEPED: 0 min
CSEPEW: 1 METS
CSEPHR: 59 %
Exercise duration (sec): 0 s
Peak HR: 93 {beats}/min
Rest HR: 69 {beats}/min

## 2016-08-01 LAB — GLUCOSE, CAPILLARY: GLUCOSE-CAPILLARY: 134 mg/dL — AB (ref 65–99)

## 2016-08-01 MED ORDER — REGADENOSON 0.4 MG/5ML IV SOLN
0.4000 mg | Freq: Once | INTRAVENOUS | Status: AC
  Administered 2016-08-01: 0.4 mg via INTRAVENOUS
  Filled 2016-08-01: qty 5

## 2016-08-01 MED ORDER — TECHNETIUM TC 99M TETROFOSMIN IV KIT
10.0000 | PACK | Freq: Once | INTRAVENOUS | Status: AC | PRN
  Administered 2016-08-01: 10 via INTRAVENOUS

## 2016-08-01 MED ORDER — REGADENOSON 0.4 MG/5ML IV SOLN
INTRAVENOUS | Status: AC
  Filled 2016-08-01: qty 5

## 2016-08-01 MED ORDER — TECHNETIUM TC 99M TETROFOSMIN IV KIT
30.0000 | PACK | Freq: Once | INTRAVENOUS | Status: AC | PRN
  Administered 2016-08-01: 30 via INTRAVENOUS

## 2016-08-01 NOTE — Progress Notes (Signed)
Pt refused bed alarm on. 

## 2016-08-01 NOTE — Progress Notes (Signed)
Advanced Heart Failure Rounding Note   Subjective:    Feels better. No SOB. Occasional chest twinge.     Objective:   Weight Range:  Vital Signs:   Temp:  [97.7 F (36.5 C)-98.6 F (37 C)] 98.6 F (37 C) (09/24 0557) Pulse Rate:  [66-90] 87 (09/24 0911) Resp:  [17-19] 18 (09/24 0557) BP: (118-158)/(71-104) 138/88 (09/24 0909) SpO2:  [95 %-99 %] 97 % (09/24 0557) Weight:  [79.7 kg (175 lb 11.2 oz)] 79.7 kg (175 lb 11.2 oz) (09/24 0557) Last BM Date: 08/01/16  Weight change: Filed Weights   07/30/16 2239 07/31/16 0029 08/01/16 0557  Weight: 80.2 kg (176 lb 11.2 oz) 80.3 kg (177 lb) 79.7 kg (175 lb 11.2 oz)    Intake/Output:   Intake/Output Summary (Last 24 hours) at 08/01/16 0939 Last data filed at 08/01/16 2595  Gross per 24 hour  Intake              720 ml  Output             1975 ml  Net            -1255 ml     Physical Exam: General:  Well appearing. No resp difficulty HEENT: normal Neck: supple. JVP flat . Carotids 2+ bilat; no bruits. No lymphadenopathy or thryomegaly appreciated. Cor: PMI nondisplaced. Regular rate & rhythm. No rubs, gallops or murmurs. Chest wall tender to palpation.  Lungs: clear Abdomen: obese soft, nontender, nondistended. No hepatosplenomegaly. No bruits or masses. Good bowel sounds. Extremities: no cyanosis, clubbing, rash, edema Neuro: alert & orientedx3, cranial nerves grossly intact. moves all 4 extremities w/o difficulty. Affect pleasant  Telemetry:  NSR with v-pacing  Labs: Basic Metabolic Panel:  Recent Labs Lab 07/29/16 1233 07/30/16 1628 07/31/16 0522  NA 142 141 145  K 4.1 3.8 3.9  CL 102 101 100*  CO2 28 33* 32  GLUCOSE 118* 133* 93  BUN 9 9 10   CREATININE 1.48* 1.59* 1.50*  CALCIUM 9.2 9.4 9.3    Liver Function Tests: No results for input(s): AST, ALT, ALKPHOS, BILITOT, PROT, ALBUMIN in the last 168 hours. No results for input(s): LIPASE, AMYLASE in the last 168 hours. No results for input(s):  AMMONIA in the last 168 hours.  CBC:  Recent Labs Lab 07/29/16 1233 07/30/16 1628 07/31/16 0522  WBC 7.4 7.9 7.2  HGB 11.3* 11.1* 11.0*  HCT 35.2* 34.0* 33.7*  MCV 86.9 85.9 86.2  PLT 162 170 161    Cardiac Enzymes:  Recent Labs Lab 07/30/16 1628 07/30/16 2205 07/31/16 0522  TROPONINI <0.03 <0.03 <0.03    BNP: BNP (last 3 results) No results for input(s): BNP in the last 8760 hours.  ProBNP (last 3 results) No results for input(s): PROBNP in the last 8760 hours.    Other results:  Imaging:  No results found.   Medications:     Scheduled Medications: . ALPRAZolam  0.25-0.5 mg Oral TID  . aspirin  325 mg Oral Daily  . cholecalciferol  1,000 Units Oral Daily  . clopidogrel  75 mg Oral Daily  . doxazosin  4 mg Oral QHS  . enoxaparin (LOVENOX) injection  40 mg Subcutaneous Q24H  . ezetimibe  10 mg Oral Daily  . furosemide  160 mg Oral q morning - 10a  . insulin glargine  18 Units Subcutaneous QHS  . isosorbide mononitrate  30 mg Oral Daily  . loratadine  10 mg Oral Daily  . metoprolol  100 mg  Oral BID  . multivitamin with minerals  1 tablet Oral Daily  . pantoprazole  40 mg Oral Daily  . potassium chloride  20 mEq Oral BID  . pregabalin  100 mg Oral QHS  . ramipril  10 mg Oral BID  . regadenoson      . simvastatin  20 mg Oral QHS  . sodium chloride flush  3 mL Intravenous Q12H  . venlafaxine  200 mg Oral q morning - 10a   And  . venlafaxine  100 mg Oral Q1200     Infusions:     PRN Medications:  acetaminophen, albuterol, fluticasone, nitroGLYCERIN, ondansetron (ZOFRAN) IV, sodium chloride, traMADol   Assessment:   1. Chest pain 2. Palpitations 3. CAD s/p CABG 1998    --last cath 2/16 severe native CAD with all grafts patent 4. Chronic systolic HF s/p MDT CRT-D    --EF 45-50% by echo 2/17  5. CKD stage III, stable   Plan/Discussion:     Stable overnight. Troponins remain negative. ICD interrogated personally with device rep. No  VT or AF. Volume status was trending up but now back down.   For Myoview today. Can go home unless scan is high risk.     Length of Stay: 0   Glori Bickers MD 08/01/2016, 9:39 AM  Advanced Heart Failure Team Pager [phone] (M-F; [place])  Please contact [facility] for night-coverage after hours (4p -7a ) and weekends on [email or web address]

## 2016-08-01 NOTE — Discharge Instructions (Signed)

## 2016-08-01 NOTE — Progress Notes (Addendum)
   Seen in Nuclear Medicine for 1-day NST. Official results pending. Being read by [facility].  Signed, Erma Heritage, PA-C 08/01/2016, 9:44 AM Pager: [phone]   Patient's NST showed:   IMPRESSION: 1. No reversible ischemia or infarction.  2. Normal left ventricular wall motion.  3. Left ventricular ejection fraction 50%  4. Non invasive risk stratification*: Low  Patient and admitting team made aware of results.   Signed, Erma Heritage, PA-C 08/01/2016, 2:33 PM Pager: [phone]

## 2016-08-01 NOTE — Discharge Summary (Signed)
[facility]  Physician Discharge Summary   Patient ID: [name] MRN: [id number] DOB/AGE: [birth year] 65 y.o.  Admit date: 07/30/2016 Discharge date: 08/01/2016  PCP: Reginia Naas, MD  DISCHARGE DIAGNOSES:  Principal Problem:   Chest pain Active Problems:   Asthma   Anxiety   Depression   HTN (hypertension)   DM (diabetes mellitus) (HCC)   Chronic diastolic heart failure (HCC)   Hyperlipidemia   RECOMMENDATIONS FOR OUTPATIENT FOLLOW UP: 1. Outpatient follow-up with cardiology or PCP in the next 1-2 weeks   DISCHARGE CONDITION: fair  Diet recommendation: Modified carbohydrate  Filed Weights   07/30/16 2239 07/31/16 0029 08/01/16 0557  Weight: 80.2 kg (176 lb 11.2 oz) 80.3 kg (177 lb) 79.7 kg (175 lb 11.2 oz)    INITIAL HISTORY: 65 year old African-American female with a past medical history of coronary artery disease status post CABG and stent placement, combined systolic and diastolic CHF, status post AICD, chronic kidney disease, unknown stage, anxiety, asthma, presented to the emergency department with complaints of chest pain. Her symptoms have been present on and off since Thursday. Usually relieved with nitroglycerin. She was hospitalized for further management.  Consultations:  Cardiology  Procedures:  Nuclear stress test, which was low risk  HOSPITAL COURSE:   Chest pain in the setting of known history of coronary artery disease. Patient had atypical pain. However, due to her known history of CAD and is status post CABG as well as stent placements in the past, she was hospitalized. Her history also includes cardiac catheterization in February 2016 which showed revealed widely patent bypass grafts. She was found to have totally occluded mid RCA, totally occluded diagonal #1 and diffuse disease in the mid LAD. Her symptoms at that time were thought to be due to microvascular vasoconstriction issues. Medical management was recommended.  She does have a paced rhythm on EKG. she was seen by cardiology. They recommended a stress test which was done this morning which is a low-risk test without any reversible ischemia. Discussed with cardiology. They did not recommend any medication changes at this time. Discussed with the patient. She is relieved to hear of the normal test. She can be discharged home today. She may continue her current home medications. She should follow-up with either her cardiologist or her primary care physician in the next 1-2 weeks.  History of chronic systolic and diastolic congestive heart failure s/p AICD Her ejection fraction is 45-50% based on echocardiogram done earlier this year. She does have grade 2 diastolic dysfunction. She appears to be euvolemic at this time. Continue her home medications. She is on a beta blocker and she is also on ACE inhibitor.  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 She is on Lantus at home which is being continued. She plans to change to a different formulation of insulin. Hence there are 2 orders for insulin in the discharge medication list.  Chronic kidney disease appears to be stage III. Renal function at baseline.   History of anxiety and depression. Continue home medications.  History of diabetic neuropathy. Continue with pregabalin  Overall, stable. She has been ambulating without any difficulty. Okay for discharge.   PERTINENT LABS:  The results of significant diagnostics from this hospitalization (including imaging, microbiology, ancillary and laboratory) are listed below for reference.     Labs: Basic Metabolic Panel:  Recent Labs Lab 07/29/16 1233 07/30/16 1628 07/31/16 0522  NA 142 141 145  K 4.1 3.8 3.9  CL 102 101 100*  CO2 28 33* 32  GLUCOSE 118* 133* 93  BUN 9 9 10   CREATININE 1.48* 1.59* 1.50*  CALCIUM 9.2 9.4 9.3   CBC:  Recent Labs Lab 07/29/16 1233 07/30/16 1628 07/31/16 0522  WBC 7.4 7.9 7.2  HGB  11.3* 11.1* 11.0*  HCT 35.2* 34.0* 33.7*  MCV 86.9 85.9 86.2  PLT 162 170 161   Cardiac Enzymes:  Recent Labs Lab 07/30/16 1628 07/30/16 2205 07/31/16 0522  TROPONINI <0.03 <0.03 <0.03    CBG:  Recent Labs Lab 07/31/16 0626 07/31/16 1212 07/31/16 1625 07/31/16 2102 08/01/16 0600  GLUCAP 89 117* 214* 159* 134*     IMAGING STUDIES Dg Chest 2 View  Result Date: 07/29/2016 CLINICAL DATA:  Mid chest pain and dyspnea EXAM: CHEST  2 VIEW COMPARISON:  12/18/2015 FINDINGS: The heart is top-normal in size. The aorta is not aneurysmal. The patient is status post median sternotomy with evidence of prior CABG. The ICD device projects over the left anterior chest wall with leads noted in the right atrium, right ventricle and coronary sinus. Lungs are clear apart from some minimal atelectasis at the left lung base. No effusion or pneumothorax. No acute osseous abnormality. IMPRESSION: No active cardiopulmonary disease. Electronically Signed   By: Ashley Royalty M.D.   On: 07/29/2016 12:55   US Transvaginal Non-ob  Result Date: 07/14/2016 Ultrasound today absent uterus. Right ovary normal. Previous cyst not seen. Left ovary 2 calcifications 5 mm and 4 mm respectively no fluid cul-de-sac no apparent adnexal masses.  Nm Myocar Multi W/spect W/wall Motion / Ef  Result Date: 08/01/2016 CLINICAL DATA:  Chest pain EXAM: MYOCARDIAL IMAGING WITH SPECT (REST AND PHARMACOLOGIC-STRESS) GATED LEFT VENTRICULAR WALL MOTION STUDY LEFT VENTRICULAR EJECTION FRACTION TECHNIQUE: Standard myocardial SPECT imaging was performed after resting intravenous injection of 10 mCi Tc-33m tetrofosmin. Subsequently, intravenous infusion of Lexiscan was performed under the supervision of the Cardiology staff. At peak effect of the drug, 30 mCi Tc-24m tetrofosmin was injected intravenously and standard myocardial SPECT imaging was performed. Quantitative gated imaging was also performed to evaluate left ventricular wall motion,  and estimate left ventricular ejection fraction. COMPARISON:  None. FINDINGS: Perfusion: No decreased activity in the left ventricle on stress imaging to suggest reversible ischemia or infarction. Breast attenuation artifact along the anterior apex, worse with rest. Wall Motion: Normal left ventricular wall motion. No left ventricular dilation. Left Ventricular Ejection Fraction: 50 % End diastolic volume 382 ml End systolic volume 49 ml IMPRESSION: 1. No reversible ischemia or infarction. 2. Normal left ventricular wall motion. 3. Left ventricular ejection fraction 50% 4. Non invasive risk stratification*: Low *2012 Appropriate Use Criteria for Coronary Revascularization Focused Update: J Am Coll Cardiol. 5053;97(6):734-193. http://content.[email or web address] Electronically Signed   By: Julian Hy M.D.   On: 08/01/2016 11:46    DISCHARGE EXAMINATION: Vitals:   08/01/16 0908 08/01/16 0909 08/01/16 0911 08/01/16 1100  BP: (!) 148/95 138/88  (!) 147/78  Pulse: 90 86 87 66  Resp:      Temp:    98 F (36.7 C)  TempSrc:    Oral  SpO2:    97%  Weight:      Height:       General appearance: alert, cooperative, appears stated age and no distress Resp: clear to auscultation bilaterally Cardio: regular rate and rhythm, S1, S2 normal, no murmur, click, rub or gallop GI: soft, non-tender; bowel sounds normal; no masses,  no organomegaly Extremities: extremities normal, atraumatic, no cyanosis or edema  DISPOSITION: Home with  daughter  Discharge Instructions    Call MD for:  extreme fatigue    Complete by:  As directed    Call MD for:  persistant dizziness or light-headedness    Complete by:  As directed    Call MD for:  persistant nausea and vomiting    Complete by:  As directed    Call MD for:  severe uncontrolled pain    Complete by:  As directed    Call MD for:  temperature >100.4    Complete by:  As directed    Diet Carb Modified    Complete by:  As  directed    Discharge instructions    Complete by:  As directed    Please be sure to follow-up with your primary care physician or cardiologist within the next 1-2 weeks. Continue taking her medications as prescribed. It does not appear that her chest pain is coming from the heart. Could be some musculoskeletal issue for due to acid reflux.  You were cared for by a hospitalist during your hospital stay. If you have any questions about your discharge medications or the care you received while you were in the hospital after you are discharged, you can call the unit and asked to speak with the hospitalist on call if the hospitalist that took care of you is not available. Once you are discharged, your primary care physician will handle any further medical issues. Please note that NO REFILLS for any discharge medications will be authorized once you are discharged, as it is imperative that you return to your primary care physician (or establish a relationship with a primary care physician if you do not have one) for your aftercare needs so that they can reassess your need for medications and monitor your lab values. If you do not have a primary care physician, you can call [phone] for a physician referral.   Increase activity slowly    Complete by:  As directed       ALLERGIES:  Allergies  Allergen Reactions  . Calcium Channel Blockers Other (See Comments)    Cannot take non-DHP CCBs (diltiazem, verapamil) due to CHF requiring ICD  . Codeine Anaphylaxis  . Other Anaphylaxis    Walnuts, pecans, and ANY melons PATIENT IS A VEGAN  . Ticlid [Ticlopidine Hcl] Other (See Comments)    Unprovoked bleeding while on Ticlid (doesn't think she was on ASA at the time) Takes Plavix without problems  . Morphine And Related Other (See Comments)    Severe AMS, confusion, weakness Tolerates Fentanyl, Tramadol  . Tape Dermatitis and Rash    Tolerates paper tape  . Digoxin And Related Nausea Only    Unknown  .  Metolazone Other (See Comments)    Cannot remember reaction  . Myrbetriq [Mirabegron] Other (See Comments)    Aggravates migraines  . Onglyza [Saxagliptin] Other (See Comments)    Exacerbates migraines  . Penicillins Hives    Has patient had a PCN reaction causing immediate rash, facial/tongue/throat swelling, SOB or lightheadedness with hypotension: Yes Has patient had a PCN reaction causing severe rash involving mucus membranes or skin necrosis: Unknown Has patient had a PCN reaction that required hospitalization: Unknown Has patient had a PCN reaction occurring within the last 10 years: No If all of the above answers are "NO", then may proceed with Cephalosporin use.   [name] Spironolactone Other (See Comments)    Cannot remember reaction  . Sulfa Antibiotics Hives  . Tetracyclines & Related Other (See  Comments)    Cannot remember reaction Tolerates macrolides (azithromycin)  . Vicodin [Hydrocodone-Acetaminophen] Other (See Comments)    EXTREME LETHARGY  . Latex Itching and Rash     Current Discharge Medication List    CONTINUE these medications which have NOT CHANGED   Details  albuterol (PROVENTIL HFA;VENTOLIN HFA) 108 (90 BASE) MCG/ACT inhaler Inhale 2 puffs into the lungs every 4 (four) hours as needed for wheezing or shortness of breath.    ALPRAZolam (XANAX) 0.5 MG tablet Take 0.5-1 tablets by mouth 3 (three) times daily. 1 mg in the morning then 0.5 mg at noon then 0.5 mg at bedtime    Artificial Tear GEL Place 2 drops into both eyes daily as needed (dry eyes).     aspirin 325 MG EC tablet Take 325 mg by mouth daily.      cetirizine (ZYRTEC) 10 MG tablet Take 10 mg by mouth daily.    Cholecalciferol (VITAMIN D-3) 1000 UNITS CAPS Take 1 capsule by mouth daily.     clopidogrel (PLAVIX) 75 MG tablet Take 1 tablet by mouth  daily Qty: 90 tablet, Refills: 3    cyclobenzaprine (FLEXERIL) 5 MG tablet Take 1 tablet (5 mg total) by mouth at bedtime as needed for muscle  spasms. Qty: 30 tablet, Refills: 0    doxazosin (CARDURA) 4 MG tablet Take 4 mg by mouth at bedtime.     fluticasone (FLONASE) 50 MCG/ACT nasal spray Place 2 sprays into both nostrils daily as needed for allergies or rhinitis.     furosemide (LASIX) 80 MG tablet Take 1 tablet by mouth two  times daily. May take an additional 80mg  daily as needed for edema. Qty: 180 tablet, Refills: 3    !! Insulin Glargine (LANTUS SOLOSTAR) 100 UNIT/ML Solostar Pen Inject 18 Units into the skin at bedtime.    isosorbide mononitrate (IMDUR) 30 MG 24 hr tablet Take 1 tablet by mouth  daily Qty: 90 tablet, Refills: 1    metoprolol (LOPRESSOR) 100 MG tablet Take 1 tablet by mouth two  times daily Qty: 180 tablet, Refills: 3    Multiple Vitamin (MULTIVITAMIN WITH MINERALS) TABS Take 1 tablet by mouth daily.    NITROSTAT 0.4 MG SL tablet Place 1 tablet under the tongue every 5 minutes as needed for chest pain, max 3 doses, go to er if no relief Qty: 25 tablet, Refills: 2    ondansetron (ZOFRAN ODT) 4 MG disintegrating tablet Take 1 tablet (4 mg total) by mouth every 8 (eight) hours as needed for nausea or vomiting. Qty: 12 tablet, Refills: 0    pantoprazole (PROTONIX) 40 MG tablet Take 40 mg by mouth daily.    potassium chloride (K-DUR) 10 MEQ tablet Take 2 tablets (20 mEq total) by mouth 2 (two) times daily. Qty: 360 tablet, Refills: 1    pregabalin (LYRICA) 50 MG capsule Take 100 mg by mouth at bedtime.     ramipril (ALTACE) 10 MG capsule Take 1 capsule by mouth  twice daily Qty: 180 capsule, Refills: 0    Riboflavin 100 MG TABS Take 2 tablets (200 mg total) by mouth daily. Qty: 60 tablet, Refills: 1    simvastatin (ZOCOR) 20 MG tablet Take 1 tablet by mouth at  bedtime Qty: 90 tablet, Refills: 2    sodium chloride (MURO 128) 5 % ophthalmic ointment Place 1 drop into both eyes at bedtime as needed. For dry eyes    traMADol (ULTRAM) 50 MG tablet Take 100 mg by mouth every 6 (  six) hours as  needed (MIGRAINES).    venlafaxine (EFFEXOR) 100 MG tablet Take 100-200 mg by mouth See admin instructions. 200 mg in the morning then 100 mg at noon    ZETIA 10 MG tablet Take 1 tablet by mouth  daily Qty: 90 tablet, Refills: 3    !! Insulin Glargine (BASAGLAR KWIKPEN) 100 UNIT/ML SOPN Inject 18 Units into the skin at bedtime. Reported on 02/18/2016     !! - Potential duplicate medications found. Please discuss with provider.    STOP taking these medications     potassium chloride (K-DUR,KLOR-CON) 10 MEQ tablet         Follow-up Information    Reginia Naas, MD Follow up in 1 week(s).   Specialty:  Family Medicine Contact information: [place] [address] [place], [place]. Schedule an appointment as soon as possible for a visit in 2 week(s).   Specialty:  Cardiology Contact information: [address] [place] [address] [phone]           TOTAL DISCHARGE TIME: 35 minutes  [facility]  [facility] Pager [phone]  08/01/2016, 1:00 PM

## 2016-08-04 ENCOUNTER — Other Ambulatory Visit: Payer: Self-pay | Admitting: Interventional Cardiology

## 2016-08-10 DIAGNOSIS — E1142 Type 2 diabetes mellitus with diabetic polyneuropathy: Secondary | ICD-10-CM | POA: Diagnosis not present

## 2016-08-11 DIAGNOSIS — M5416 Radiculopathy, lumbar region: Secondary | ICD-10-CM | POA: Diagnosis not present

## 2016-08-11 DIAGNOSIS — M5136 Other intervertebral disc degeneration, lumbar region: Secondary | ICD-10-CM | POA: Diagnosis not present

## 2016-08-12 ENCOUNTER — Ambulatory Visit (INDEPENDENT_AMBULATORY_CARE_PROVIDER_SITE_OTHER): Payer: Medicare Other | Admitting: *Deleted

## 2016-08-12 DIAGNOSIS — I255 Ischemic cardiomyopathy: Secondary | ICD-10-CM

## 2016-08-12 NOTE — Progress Notes (Signed)
Remote ICD transmission.   

## 2016-08-13 ENCOUNTER — Encounter: Payer: Self-pay | Admitting: Cardiology

## 2016-08-22 LAB — CUP PACEART REMOTE DEVICE CHECK
Battery Remaining Longevity: 104 mo
Battery Voltage: 3 V
Brady Statistic AP VP Percent: 0.02 %
Brady Statistic RA Percent Paced: 0.04 %
Date Time Interrogation Session: 20171005073524
HIGH POWER IMPEDANCE MEASURED VALUE: 45 Ohm
HighPow Impedance: 38 Ohm
Implantable Lead Implant Date: 20090610
Implantable Lead Implant Date: 20090610
Implantable Lead Model: 4194
Implantable Lead Model: 6947
Lead Channel Impedance Value: 456 Ohm
Lead Channel Impedance Value: 665 Ohm
Lead Channel Pacing Threshold Amplitude: 0.625 V
Lead Channel Pacing Threshold Amplitude: 0.875 V
Lead Channel Pacing Threshold Pulse Width: 0.4 ms
Lead Channel Pacing Threshold Pulse Width: 0.4 ms
Lead Channel Sensing Intrinsic Amplitude: 2.125 mV
Lead Channel Sensing Intrinsic Amplitude: 3.5 mV
Lead Channel Setting Pacing Pulse Width: 0.4 ms
MDC IDC LEAD IMPLANT DT: 20090610
MDC IDC LEAD LOCATION: 753858
MDC IDC LEAD LOCATION: 753859
MDC IDC LEAD LOCATION: 753860
MDC IDC MSMT LEADCHNL LV IMPEDANCE VALUE: 285 Ohm
MDC IDC MSMT LEADCHNL LV IMPEDANCE VALUE: 532 Ohm
MDC IDC MSMT LEADCHNL LV PACING THRESHOLD AMPLITUDE: 1 V
MDC IDC MSMT LEADCHNL RA PACING THRESHOLD PULSEWIDTH: 0.4 ms
MDC IDC MSMT LEADCHNL RA SENSING INTR AMPL: 2.125 mV
MDC IDC MSMT LEADCHNL RV IMPEDANCE VALUE: 361 Ohm
MDC IDC MSMT LEADCHNL RV IMPEDANCE VALUE: 456 Ohm
MDC IDC MSMT LEADCHNL RV SENSING INTR AMPL: 3.5 mV
MDC IDC SET LEADCHNL LV PACING AMPLITUDE: 2 V
MDC IDC SET LEADCHNL RA PACING AMPLITUDE: 1.5 V
MDC IDC SET LEADCHNL RV SENSING SENSITIVITY: 0.3 mV
MDC IDC STAT BRADY AP VS PERCENT: 0.01 %
MDC IDC STAT BRADY AS VP PERCENT: 98.56 %
MDC IDC STAT BRADY AS VS PERCENT: 1.4 %
MDC IDC STAT BRADY RV PERCENT PACED: 0.25 %

## 2016-08-27 ENCOUNTER — Encounter: Payer: Self-pay | Admitting: Cardiology

## 2016-08-27 DIAGNOSIS — M5416 Radiculopathy, lumbar region: Secondary | ICD-10-CM | POA: Diagnosis not present

## 2016-08-27 DIAGNOSIS — M5136 Other intervertebral disc degeneration, lumbar region: Secondary | ICD-10-CM | POA: Diagnosis not present

## 2016-08-27 DIAGNOSIS — M5117 Intervertebral disc disorders with radiculopathy, lumbosacral region: Secondary | ICD-10-CM | POA: Diagnosis not present

## 2016-09-06 DIAGNOSIS — H25813 Combined forms of age-related cataract, bilateral: Secondary | ICD-10-CM | POA: Diagnosis not present

## 2016-09-06 DIAGNOSIS — E119 Type 2 diabetes mellitus without complications: Secondary | ICD-10-CM | POA: Diagnosis not present

## 2016-09-06 DIAGNOSIS — H04123 Dry eye syndrome of bilateral lacrimal glands: Secondary | ICD-10-CM | POA: Diagnosis not present

## 2016-09-17 DIAGNOSIS — M5136 Other intervertebral disc degeneration, lumbar region: Secondary | ICD-10-CM | POA: Diagnosis not present

## 2016-09-20 DIAGNOSIS — F332 Major depressive disorder, recurrent severe without psychotic features: Secondary | ICD-10-CM | POA: Diagnosis not present

## 2016-09-20 DIAGNOSIS — F411 Generalized anxiety disorder: Secondary | ICD-10-CM | POA: Diagnosis not present

## 2016-10-04 ENCOUNTER — Encounter: Payer: Self-pay | Admitting: Interventional Cardiology

## 2016-10-04 ENCOUNTER — Ambulatory Visit (INDEPENDENT_AMBULATORY_CARE_PROVIDER_SITE_OTHER): Payer: Medicare Other | Admitting: Interventional Cardiology

## 2016-10-04 VITALS — BP 142/84 | HR 68 | Ht 64.5 in | Wt 184.8 lb

## 2016-10-04 DIAGNOSIS — I255 Ischemic cardiomyopathy: Secondary | ICD-10-CM | POA: Diagnosis not present

## 2016-10-04 DIAGNOSIS — I6340 Cerebral infarction due to embolism of unspecified cerebral artery: Secondary | ICD-10-CM | POA: Diagnosis not present

## 2016-10-04 DIAGNOSIS — I25810 Atherosclerosis of coronary artery bypass graft(s) without angina pectoris: Secondary | ICD-10-CM | POA: Diagnosis not present

## 2016-10-04 DIAGNOSIS — I471 Supraventricular tachycardia: Secondary | ICD-10-CM | POA: Diagnosis not present

## 2016-10-04 DIAGNOSIS — I10 Essential (primary) hypertension: Secondary | ICD-10-CM

## 2016-10-04 DIAGNOSIS — I5032 Chronic diastolic (congestive) heart failure: Secondary | ICD-10-CM | POA: Diagnosis not present

## 2016-10-04 DIAGNOSIS — Z95810 Presence of automatic (implantable) cardiac defibrillator: Secondary | ICD-10-CM

## 2016-10-04 NOTE — Progress Notes (Signed)
Cardiology Office Note    Date:  10/04/2016   ID:  Emily, [name] [birth year], MRN [id number]  PCP:  Reginia Naas, MD  Cardiologist: Sinclair Grooms, MD   Chief Complaint  Patient presents with  . Coronary Artery Disease    History of Present Illness:  [name] is a 65 y.o. female who presents for CAD, CHF, Defibrillator, ischemic cardiomyopathy, mitral valve disease with moderate regurgitation, diabetes mellitus, and ischemic stroke history.  Typical is chest discomfort with left arm numbness and discomfort.She have the case compliance with her medical regimen. Her major problem now is been inability to freely ambulate because of back discomfort. She had a recent overnight stay in the hospital when she presented with chest pain. She ruled out for myocardial infarction. Invasive evaluation was not performed. A nuclear study earlier this year    Past Medical History:  Diagnosis Date  . AICD (automatic cardioverter/defibrillator) present    [employer]- Dr. Beckie Salts follows  . Anginal pain ([place])   . Anxiety   . Asthma   . Back pain    "pinched nerve-lower back" - Dr. Nelva Bush follows.  . Cervical dysplasia   . CHF (congestive heart failure) ([place])   . Chronic kidney disease    Dr. Posey Pronto follows.  . Complication of anesthesia    "I wake up during surgeries" (02/14/2013)  . Coronary artery disease   . Depression   . Fibroid   . Function kidney decreased   . GERD (gastroesophageal reflux disease)   . Hepatitis    Hepatitis A -college yrs"water source exposure"  . Hiatal hernia   . History of shingles    2-3 yrs ago last out break "around waist"  . History of stomach ulcers   . Hypertension   . ICD (implantable cardiac defibrillator) in place   . Iron deficiency anemia   . Ischemic cardiomyopathy    status post biventricular ICD placed by DR Edumunds who used to see Dr Melvern Banker here to establish  cardiovascular care.  . Migraines   . MVP (mitral  valve prolapse)    Antibiotics not required for procedures  . Myocardial infarction    "I've had 2; the others they were able to catch before completing" (02/14/2013)  . Pacemaker   . Pneumonia 1950's & 1985  . Shortness of breath    "lying down flat; at times w/exertion" (02/14/2013). 10-06-15 exertion only..  . Stroke [facility])    "2 confirmed; 9 TIA's; results in dragging LLE; numbness in tip of tongue" (02/14/2013),10-06-15 right hand tends to be weaker when tired.  . Type II diabetes mellitus ([place])     Past Surgical History:  Procedure Laterality Date  . ABDOMINAL HYSTERECTOMY  1985   TAH   . BIV ICD GENERTAOR CHANGE OUT  06/2007; 04/2008   "2 lead initial placement, at [facility]; done at [facility], after developing CHF" (02/14/2013)  . BIV PACEMAKER GENERATOR CHANGE OUT N/A 01/29/2015   Procedure: BIV PACEMAKER GENERATOR CHANGE OUT;  Surgeon: Evans Lance, MD;  Location: [facility] CATH LAB;  Service: Cardiovascular;  Laterality: N/A;  . BREAST EXCISIONAL BIOPSY Left 01/2007; 06/2007; 03/2008   "benign" (02/14/2013)  . BREAST SURGERY    . CARDIAC CATHETERIZATION     "probably in the teens" (02/14/2013)  . CARDIAC DEFIBRILLATOR PLACEMENT    . COLONOSCOPY  ~ 2002  . COLONOSCOPY WITH PROPOFOL N/A 10/07/2015   Procedure: COLONOSCOPY WITH PROPOFOL;  Surgeon: Juanita Craver, MD;  Location: [facility] ENDOSCOPY;  Service: Endoscopy;  Laterality: N/A;  . CORONARY ANGIOPLASTY WITH STENT PLACEMENT     "started out w/5; bypass corrected some; 1 stent since the bypass" (02/14/2013)  . CORONARY ARTERY BYPASS GRAFT  ` 1998   LIMA-LAD, SVG-D1, SVG-PDA  . [place] OF UTERUS  1975 X 2; 1976; 1977  . INSERT / REPLACE / REMOVE PACEMAKER     biventricular defibrillator--06/10/ 2009  . LEFT HEART CATHETERIZATION WITH CORONARY/GRAFT ANGIOGRAM N/A 01/03/2015   Procedure: LEFT HEART CATHETERIZATION WITH Beatrix Fetters;  Surgeon: Sinclair Grooms, MD;  Location: [facility] CATH LAB;  Service: Cardiovascular;  Laterality: N/A;  .  SUPRAVENTRICULAR TACHYCARDIA ABLATION  06/2007  . TEE WITHOUT CARDIOVERSION  11/14/2012   Procedure: TRANSESOPHAGEAL ECHOCARDIOGRAM (TEE);  Surgeon: Candee Furbish, MD;  Location: [facility] ENDOSCOPY;  Service: Cardiovascular;  Laterality: N/A;    Current Medications: Outpatient Medications Prior to Visit  Medication Sig Dispense Refill  . albuterol (PROVENTIL HFA;VENTOLIN HFA) 108 (90 BASE) MCG/ACT inhaler Inhale 2 puffs into the lungs every 4 (four) hours as needed for wheezing or shortness of breath.    . ALPRAZolam (XANAX) 0.5 MG tablet Take 0.5-1 tablets by mouth 3 (three) times daily. 1 mg in the morning then 0.5 mg at noon then 0.5 mg at bedtime    . Artificial Tear GEL Place 2 drops into both eyes daily as needed (dry eyes).     [name] aspirin 325 MG EC tablet Take 325 mg by mouth daily.      . cetirizine (ZYRTEC) 10 MG tablet Take 10 mg by mouth daily.    . Cholecalciferol (VITAMIN D-3) 1000 UNITS CAPS Take 1 capsule by mouth daily.     . clopidogrel (PLAVIX) 75 MG tablet Take 1 tablet by mouth  daily 90 tablet 3  . cyclobenzaprine (FLEXERIL) 5 MG tablet Take 1 tablet (5 mg total) by mouth at bedtime as needed for muscle spasms. 30 tablet 0  . doxazosin (CARDURA) 4 MG tablet Take 4 mg by mouth at bedtime.     . fluticasone (FLONASE) 50 MCG/ACT nasal spray Place 2 sprays into both nostrils daily as needed for allergies or rhinitis.     . furosemide (LASIX) 80 MG tablet Take 1 tablet by mouth two  times daily. May take an additional 80mg  daily as needed for edema. (Patient taking differently: Take 160 mg by mouth every morning. ) 180 tablet 3  . Insulin Glargine (BASAGLAR KWIKPEN) 100 UNIT/ML SOPN Inject 20 Units into the skin at bedtime. Reported on 02/18/2016     . isosorbide mononitrate (IMDUR) 30 MG 24 hr tablet Take 1 tablet by mouth  daily (Patient taking differently: Take 30 mg by mouth in the morning) 90 tablet 1  . metoprolol (LOPRESSOR) 100 MG tablet Take 1 tablet by mouth two  times daily 180  tablet 3  . Multiple Vitamin (MULTIVITAMIN WITH MINERALS) TABS Take 1 tablet by mouth daily.    [name] NITROSTAT 0.4 MG SL tablet Place 1 tablet under the tongue every 5 minutes as needed for chest pain, max 3 doses, go to er if no relief 25 tablet 2  . ondansetron (ZOFRAN ODT) 4 MG disintegrating tablet Take 1 tablet (4 mg total) by mouth every 8 (eight) hours as needed for nausea or vomiting. 12 tablet 0  . pantoprazole (PROTONIX) 40 MG tablet Take 40 mg by mouth daily.    . potassium chloride (K-DUR) 10 MEQ tablet Take 2 tablets (20 mEq total) by mouth 2 (two) times  daily. 360 tablet 1  . pregabalin (LYRICA) 50 MG capsule Take 100 mg by mouth at bedtime.     . ramipril (ALTACE) 10 MG capsule TAKE 1 CAPSULE BY MOUTH  TWICE DAILY 180 capsule 2  . Riboflavin 100 MG TABS Take 2 tablets (200 mg total) by mouth daily. 60 tablet 1  . simvastatin (ZOCOR) 20 MG tablet TAKE 1 TABLET BY MOUTH AT  BEDTIME 90 tablet 2  . sodium chloride (MURO 128) 5 % ophthalmic ointment Place 1 drop into both eyes at bedtime. For dry eyes    . traMADol (ULTRAM) 50 MG tablet Take 100 mg by mouth every 6 (six) hours as needed (MIGRAINES).    [name] venlafaxine (EFFEXOR) 100 MG tablet Take 100-200 mg by mouth See admin instructions. 200 mg in the morning then 100 mg at noon    . ZETIA 10 MG tablet Take 1 tablet by mouth  daily 90 tablet 3  . Insulin Glargine (LANTUS SOLOSTAR) 100 UNIT/ML Solostar Pen Inject 18 Units into the skin at bedtime.    . potassium chloride (K-DUR,KLOR-CON) 10 MEQ tablet TAKE 2 TABLETS BY MOUTH TWO TIMES DAILY (Patient not taking: Reported on 10/04/2016) 360 tablet 2   No facility-administered medications prior to visit.      Allergies:   Calcium channel blockers; Codeine; Other; Ticlid [ticlopidine hcl]; Morphine and related; Tape; Digoxin and related; Metolazone; Myrbetriq [mirabegron]; Onglyza [saxagliptin]; Penicillins; Spironolactone; Sulfa antibiotics; Tetracyclines & related; Vicodin  [hydrocodone-acetaminophen]; and Latex   Social History   Social History  . Marital status: Married    Spouse name: [name]  . Number of children: 1  . Years of education: MA   Occupational History  .  Unemployed    Disablity   Social History Main Topics  . Smoking status: Never Smoker  . Smokeless tobacco: Never Used  . Alcohol use No  . Drug use: No  . Sexual activity: Yes    Birth control/ protection: Surgical   Other Topics Concern  . None   Social History Narrative   Patient lives at home with spouse.     Daughter name is Tourist information centre manager.   Caffeine Use: none     Family History:  The patient's family history includes Diabetes in her brother, father, and paternal grandmother; Heart attack in her brother, father, and mother; Heart disease in her brother, father, and mother; Hypertension in her father and mother; Kidney failure in her brother and father; Stroke in her father and mother.   ROS:   Please see the history of present illness.    Depression over the death of to close first cousins of vascular disease. Anxiety. Back discomfort requiring nerve block procedures by Dr. Jeanell Sparrow most. Episodes of dizziness, depression, chronic shortness of breath, and fleeting chest pain continue. Excessive fatigue. Weight gain, not fluid related.  All other systems reviewed and are negative.   PHYSICAL EXAM:   VS:  BP (!) 142/84   Pulse 68   Ht 5' 4.5" (1.638 m)   Wt 184 lb 12.8 oz (83.8 kg)   BMI 31.23 kg/m    GEN: Well nourished, well developed, in no acute distress  HEENT: normal  Neck: no JVD, carotid bruits, or masses Cardiac: RRR; no murmurs, rubs, or gallops,no edema  Respiratory:  clear to auscultation bilaterally, normal work of breathing GI: soft, nontender, nondistended, + BS MS: no deformity or atrophy  Skin: warm and dry, no rash Neuro:  Alert and Oriented x 3, Strength and sensation are  intact Psych: euthymic mood, full affect  Wt Readings from Last 3 Encounters:    10/04/16 184 lb 12.8 oz (83.8 kg)  08/01/16 175 lb 11.2 oz (79.7 kg)  06/07/16 178 lb (80.7 kg)      Studies/Labs Reviewed:   EKG:  EKG  Repeated  Recent Labs: 01/01/2016: ALT 26 07/31/2016: BUN 10; Creatinine, Ser 1.50; Hemoglobin 11.0; Platelets 161; Potassium 3.9; Sodium 145   Lipid Panel    Component Value Date/Time   CHOL 133 01/02/2016 0730   TRIG 131 01/02/2016 0730   HDL 41 01/02/2016 0730   CHOLHDL 3.2 01/02/2016 0730   VLDL 26 01/02/2016 0730   LDLCALC 66 01/02/2016 0730    Additional studies/ records that were reviewed today include:  Cardiopulmonary exercise function test May 2017: Conclusion: The interpretation of this test is limited due to submaximal effort during the exercise. Sufficient data suggests at least a mild functional impairment (most likely circulatory) when compared to matched sedentary norms. There is no evidence of ventilatory limitation. Patient's body habitus is likely playing a role in exercise intolerance. Also note, there was significant chronotropic incompetence.   ASSESSMENT:    1. Coronary artery disease involving coronary bypass graft of native heart without angina pectoris   2. Chronic diastolic heart failure (HCC)   3. Paroxysmal SVT (supraventricular tachycardia) (HCC)   4. Cerebral infarction due to embolism of cerebral artery (HCC)      PLAN:  In order of problems listed above:  1. Stable without crescendo pattern. Denies orthopnea, PND, and worsening exertional tolerance. 2. 9 weight gain, however the patient appears euvolemic. This is likely due to increased caloric intake. 3. No significant recurrences 4. Not addressed. She does tell me that each time Plavix. She will have a TIA. 5. Followed in device clinic 6. Mild elevation in systolic pressure. We discussed weight reduction and salt restriction in the diet. I encouraged aerobic activity but she is unable to test take currently because of severe back  trouble.    Medication Adjustments/Labs and Tests Ordered: Current medicines are reviewed at length with the patient today.  Concerns regarding medicines are outlined above.  Medication changes, Labs and Tests ordered today are listed in the Patient Instructions below. There are no Patient Instructions on file for this visit.   Signed, Sinclair Grooms, MD  10/04/2016 3:57 PM    [facility] [address], [place], [place]  [address] Phone: [phone]; Fax: [phone]

## 2016-10-04 NOTE — Patient Instructions (Signed)
Medication Instructions:  None  Labwork: None  Testing/Procedures: None  Follow-Up: Your physician wants you to follow-up in: 4-6 months with Dr. Tamala Julian.  You will receive a reminder letter in the mail two months in advance. If you don't receive a letter, please call our office to schedule the follow-up appointment.   Any Other Special Instructions Will Be Listed Below (If Applicable).     If you need a refill on your cardiac medications before your next appointment, please call your pharmacy.

## 2016-10-10 ENCOUNTER — Other Ambulatory Visit: Payer: Self-pay | Admitting: Interventional Cardiology

## 2016-10-15 DIAGNOSIS — M5136 Other intervertebral disc degeneration, lumbar region: Secondary | ICD-10-CM | POA: Diagnosis not present

## 2016-10-15 DIAGNOSIS — M5117 Intervertebral disc disorders with radiculopathy, lumbosacral region: Secondary | ICD-10-CM | POA: Diagnosis not present

## 2016-10-15 DIAGNOSIS — M5416 Radiculopathy, lumbar region: Secondary | ICD-10-CM | POA: Diagnosis not present

## 2016-10-19 DIAGNOSIS — N183 Chronic kidney disease, stage 3 (moderate): Secondary | ICD-10-CM | POA: Diagnosis not present

## 2016-10-19 DIAGNOSIS — N2581 Secondary hyperparathyroidism of renal origin: Secondary | ICD-10-CM | POA: Diagnosis not present

## 2016-10-28 DIAGNOSIS — N2581 Secondary hyperparathyroidism of renal origin: Secondary | ICD-10-CM | POA: Diagnosis not present

## 2016-10-28 DIAGNOSIS — I129 Hypertensive chronic kidney disease with stage 1 through stage 4 chronic kidney disease, or unspecified chronic kidney disease: Secondary | ICD-10-CM | POA: Diagnosis not present

## 2016-10-28 DIAGNOSIS — D631 Anemia in chronic kidney disease: Secondary | ICD-10-CM | POA: Diagnosis not present

## 2016-10-28 DIAGNOSIS — N183 Chronic kidney disease, stage 3 (moderate): Secondary | ICD-10-CM | POA: Diagnosis not present

## 2016-10-29 DIAGNOSIS — M47816 Spondylosis without myelopathy or radiculopathy, lumbar region: Secondary | ICD-10-CM | POA: Diagnosis not present

## 2016-10-29 DIAGNOSIS — M5136 Other intervertebral disc degeneration, lumbar region: Secondary | ICD-10-CM | POA: Diagnosis not present

## 2016-11-05 DIAGNOSIS — M5136 Other intervertebral disc degeneration, lumbar region: Secondary | ICD-10-CM | POA: Diagnosis not present

## 2016-11-05 DIAGNOSIS — M47816 Spondylosis without myelopathy or radiculopathy, lumbar region: Secondary | ICD-10-CM | POA: Diagnosis not present

## 2016-11-11 ENCOUNTER — Ambulatory Visit (INDEPENDENT_AMBULATORY_CARE_PROVIDER_SITE_OTHER): Payer: Medicare Other | Admitting: *Deleted

## 2016-11-11 DIAGNOSIS — I255 Ischemic cardiomyopathy: Secondary | ICD-10-CM | POA: Diagnosis not present

## 2016-11-11 NOTE — Progress Notes (Signed)
Remote ICD transmission.   

## 2016-11-12 ENCOUNTER — Encounter: Payer: Self-pay | Admitting: Cardiology

## 2016-11-20 DIAGNOSIS — M47816 Spondylosis without myelopathy or radiculopathy, lumbar region: Secondary | ICD-10-CM | POA: Diagnosis not present

## 2016-11-20 DIAGNOSIS — M5136 Other intervertebral disc degeneration, lumbar region: Secondary | ICD-10-CM | POA: Diagnosis not present

## 2016-11-30 LAB — CUP PACEART REMOTE DEVICE CHECK
Battery Remaining Longevity: 101 mo
Battery Voltage: 3 V
Brady Statistic AS VS Percent: 1.42 %
Date Time Interrogation Session: 20180104083624
HIGH POWER IMPEDANCE MEASURED VALUE: 38 Ohm
HIGH POWER IMPEDANCE MEASURED VALUE: 48 Ohm
Implantable Lead Implant Date: 20090610
Implantable Lead Location: 753858
Implantable Lead Location: 753860
Implantable Lead Model: 4194
Implantable Lead Model: 5076
Lead Channel Impedance Value: 475 Ohm
Lead Channel Pacing Threshold Amplitude: 0.625 V
Lead Channel Pacing Threshold Amplitude: 0.75 V
Lead Channel Pacing Threshold Pulse Width: 0.4 ms
Lead Channel Pacing Threshold Pulse Width: 0.4 ms
Lead Channel Sensing Intrinsic Amplitude: 2.5 mV
Lead Channel Sensing Intrinsic Amplitude: 4.125 mV
Lead Channel Sensing Intrinsic Amplitude: 4.125 mV
Lead Channel Setting Pacing Pulse Width: 0.4 ms
Lead Channel Setting Sensing Sensitivity: 0.3 mV
MDC IDC LEAD IMPLANT DT: 20090610
MDC IDC LEAD IMPLANT DT: 20090610
MDC IDC LEAD LOCATION: 753859
MDC IDC MSMT LEADCHNL LV IMPEDANCE VALUE: 285 Ohm
MDC IDC MSMT LEADCHNL LV IMPEDANCE VALUE: 513 Ohm
MDC IDC MSMT LEADCHNL LV IMPEDANCE VALUE: 646 Ohm
MDC IDC MSMT LEADCHNL LV PACING THRESHOLD AMPLITUDE: 1 V
MDC IDC MSMT LEADCHNL RA PACING THRESHOLD PULSEWIDTH: 0.4 ms
MDC IDC MSMT LEADCHNL RA SENSING INTR AMPL: 2.5 mV
MDC IDC MSMT LEADCHNL RV IMPEDANCE VALUE: 342 Ohm
MDC IDC MSMT LEADCHNL RV IMPEDANCE VALUE: 456 Ohm
MDC IDC PG IMPLANT DT: 20160323
MDC IDC SET LEADCHNL LV PACING AMPLITUDE: 2 V
MDC IDC SET LEADCHNL RA PACING AMPLITUDE: 1.5 V
MDC IDC SET LEADCHNL RV PACING AMPLITUDE: 2 V
MDC IDC SET LEADCHNL RV PACING PULSEWIDTH: 0.4 ms
MDC IDC STAT BRADY AP VP PERCENT: 0.04 %
MDC IDC STAT BRADY AP VS PERCENT: 0.01 %
MDC IDC STAT BRADY AS VP PERCENT: 98.54 %
MDC IDC STAT BRADY RA PERCENT PACED: 0.05 %
MDC IDC STAT BRADY RV PERCENT PACED: 0.35 %

## 2016-12-01 ENCOUNTER — Encounter: Payer: Self-pay | Admitting: Cardiology

## 2016-12-16 DIAGNOSIS — F332 Major depressive disorder, recurrent severe without psychotic features: Secondary | ICD-10-CM | POA: Diagnosis not present

## 2016-12-16 DIAGNOSIS — F411 Generalized anxiety disorder: Secondary | ICD-10-CM | POA: Diagnosis not present

## 2016-12-31 ENCOUNTER — Other Ambulatory Visit: Payer: Self-pay | Admitting: Interventional Cardiology

## 2017-01-04 NOTE — Telephone Encounter (Signed)
Not a HF patient

## 2017-01-04 NOTE — Telephone Encounter (Signed)
Ok to fill x 1 month.  Pt following up with Dr. Tamala Julian in one month and changes possibly made at that time.  Thanks!

## 2017-01-04 NOTE — Telephone Encounter (Signed)
This was refilled by the CHF clinic last as below. I routed the refill request to that office and they responded back that this is not a CHF patient. Okay to refill under Dr Tamala Julian? If okay, please advise on how patient should be taking it as it was sent in with two different sigs. Order Providers   Prescribing Provider Encounter Provider  Jolaine Artist, MD Jolaine Artist, MD  Medication Detail    Disp Refills Start End   furosemide (LASIX) 80 MG tablet 180 tablet 3 03/22/2016    Sig: Take 1 tablet by mouth two times daily. May take an additional 80mg  daily as needed for edema.   Patient taking differently: Take 160 mg by mouth every morning.        E-Prescribing Status: Receipt confirmed by pharmacy (03/22/2016 3:07 PM EDT)   Pharmacy   [facility] [place], [address] [place] [address]   Thanks, [place]

## 2017-01-13 ENCOUNTER — Telehealth: Payer: Self-pay | Admitting: Interventional Cardiology

## 2017-01-13 NOTE — Telephone Encounter (Signed)
Walk In Pt Form-Disability Parking Pla-Card application dropped off placed in [employer]

## 2017-01-20 ENCOUNTER — Encounter: Payer: Self-pay | Admitting: Interventional Cardiology

## 2017-01-30 NOTE — Progress Notes (Signed)
Cardiology Office Note    Date:  01/31/2017   ID:  [name], [name] [birth year], MRN [id number]  PCP:  Reginia Naas, MD  Cardiologist: Sinclair Grooms, MD   Chief Complaint  Patient presents with  . Follow-up    has had Chest Pain and discomfort.      History of Present Illness:  [name] is a 66 y.o. female presents for CAD, CHF, Defibrillator, ischemic cardiomyopathy, mitral valve disease with moderate regurgitation, diabetes mellitus, and ischemic stroke history.  Complaints of increasing dyspnea, slowly over the past 6 months since admission in September. No significant orthopnea or PND. Chest discomfort occurs spontaneously. Hospital stay for prolonged chest pain in September yielded a low risk myocardial perfusion study.  Past Medical History:  Diagnosis Date  . AICD (automatic cardioverter/defibrillator) present    [employer]- Dr. Beckie Salts follows  . Anginal pain ([place])   . Anxiety   . Asthma   . Back pain    "pinched nerve-lower back" - Dr. Nelva Bush follows.  . Cervical dysplasia   . CHF (congestive heart failure) ([place])   . Chronic kidney disease    Dr. Posey Pronto follows.  . Complication of anesthesia    "I wake up during surgeries" (02/14/2013)  . Coronary artery disease   . Depression   . Fibroid   . Function kidney decreased   . GERD (gastroesophageal reflux disease)   . Hepatitis    Hepatitis A -college yrs"water source exposure"  . Hiatal hernia   . History of shingles    2-3 yrs ago last out break "around waist"  . History of stomach ulcers   . Hypertension   . ICD (implantable cardiac defibrillator) in place   . Iron deficiency anemia   . Ischemic cardiomyopathy    status post biventricular ICD placed by DR Edumunds who used to see Dr Melvern Banker here to establish  cardiovascular care.  . Migraines   . MVP (mitral valve prolapse)    Antibiotics not required for procedures  . Myocardial infarction    "I've had 2; the others they were  able to catch before completing" (02/14/2013)  . Pacemaker   . Pneumonia 1950's & 1985  . Shortness of breath    "lying down flat; at times w/exertion" (02/14/2013). 10-06-15 exertion only..  . Stroke [facility])    "2 confirmed; 9 TIA's; results in dragging LLE; numbness in tip of tongue" (02/14/2013),10-06-15 right hand tends to be weaker when tired.  . Type II diabetes mellitus ([place])     Past Surgical History:  Procedure Laterality Date  . ABDOMINAL HYSTERECTOMY  1985   TAH   . BIV ICD GENERTAOR CHANGE OUT  06/2007; 04/2008   "2 lead initial placement, at [facility]; done at [facility], after developing CHF" (02/14/2013)  . BIV PACEMAKER GENERATOR CHANGE OUT N/A 01/29/2015   Procedure: BIV PACEMAKER GENERATOR CHANGE OUT;  Surgeon: Evans Lance, MD;  Location: [facility] CATH LAB;  Service: Cardiovascular;  Laterality: N/A;  . BREAST EXCISIONAL BIOPSY Left 01/2007; 06/2007; 03/2008   "benign" (02/14/2013)  . BREAST SURGERY    . CARDIAC CATHETERIZATION     "probably in the teens" (02/14/2013)  . CARDIAC DEFIBRILLATOR PLACEMENT    . COLONOSCOPY  ~ 2002  . COLONOSCOPY WITH PROPOFOL N/A 10/07/2015   Procedure: COLONOSCOPY WITH PROPOFOL;  Surgeon: Juanita Craver, MD;  Location: [facility] ENDOSCOPY;  Service: Endoscopy;  Laterality: N/A;  . CORONARY ANGIOPLASTY WITH STENT PLACEMENT     "started out w/5;  bypass corrected some; 1 stent since the bypass" (02/14/2013)  . CORONARY ARTERY BYPASS GRAFT  ` 1998   LIMA-LAD, SVG-D1, SVG-PDA  . Section OF UTERUS  1975 X 2; 1976; 1977  . INSERT / REPLACE / REMOVE PACEMAKER     biventricular defibrillator--06/10/ 2009  . LEFT HEART CATHETERIZATION WITH CORONARY/GRAFT ANGIOGRAM N/A 01/03/2015   Procedure: LEFT HEART CATHETERIZATION WITH Beatrix Fetters;  Surgeon: Sinclair Grooms, MD;  Location: [facility] CATH LAB;  Service: Cardiovascular;  Laterality: N/A;  . SUPRAVENTRICULAR TACHYCARDIA ABLATION  06/2007  . TEE WITHOUT CARDIOVERSION  11/14/2012   Procedure: TRANSESOPHAGEAL  ECHOCARDIOGRAM (TEE);  Surgeon: Candee Furbish, MD;  Location: [facility] ENDOSCOPY;  Service: Cardiovascular;  Laterality: N/A;    Current Medications: Outpatient Medications Prior to Visit  Medication Sig Dispense Refill  . albuterol (PROVENTIL HFA;VENTOLIN HFA) 108 (90 BASE) MCG/ACT inhaler Inhale 2 puffs into the lungs every 4 (four) hours as needed for wheezing or shortness of breath.    . ALPRAZolam (XANAX) 0.5 MG tablet Take 0.5-1 tablets by mouth 3 (three) times daily. 1 mg in the morning then 0.5 mg at noon then 0.5 mg at bedtime    . Artificial Tear GEL Place 2 drops into both eyes daily as needed (dry eyes).     [name] aspirin 325 MG EC tablet Take 325 mg by mouth daily.      . cetirizine (ZYRTEC) 10 MG tablet Take 10 mg by mouth daily.    . Cholecalciferol (VITAMIN D-3) 1000 UNITS CAPS Take 1 capsule by mouth daily.     . clopidogrel (PLAVIX) 75 MG tablet TAKE 1 TABLET BY MOUTH  DAILY 90 tablet 2  . cyclobenzaprine (FLEXERIL) 5 MG tablet Take 1 tablet (5 mg total) by mouth at bedtime as needed for muscle spasms. 30 tablet 0  . doxazosin (CARDURA) 4 MG tablet Take 4 mg by mouth at bedtime.     [name] ezetimibe (ZETIA) 10 MG tablet TAKE 1 TABLET BY MOUTH  DAILY 90 tablet 2  . fluticasone (FLONASE) 50 MCG/ACT nasal spray Place 2 sprays into both nostrils daily as needed for allergies or rhinitis.     . furosemide (LASIX) 80 MG tablet Take 1 tablet (80 mg total) by mouth 2 (two) times daily. May take an additional 80 mg by mouth as needed for edema. 180 tablet 0  . Insulin Glargine (BASAGLAR KWIKPEN) 100 UNIT/ML SOPN Inject 20 Units into the skin at bedtime. Reported on 02/18/2016     . metoprolol (LOPRESSOR) 100 MG tablet TAKE 1 TABLET BY MOUTH TWO  TIMES DAILY 180 tablet 2  . Multiple Vitamin (MULTIVITAMIN WITH MINERALS) TABS Take 1 tablet by mouth daily.    [name] NITROSTAT 0.4 MG SL tablet Place 1 tablet under the tongue every 5 minutes as needed for chest pain, max 3 doses, go to er if no relief 25 tablet 2    . ondansetron (ZOFRAN ODT) 4 MG disintegrating tablet Take 1 tablet (4 mg total) by mouth every 8 (eight) hours as needed for nausea or vomiting. 12 tablet 0  . pantoprazole (PROTONIX) 40 MG tablet Take 40 mg by mouth daily.    . potassium chloride (K-DUR) 10 MEQ tablet Take 2 tablets (20 mEq total) by mouth 2 (two) times daily. 360 tablet 1  . pregabalin (LYRICA) 50 MG capsule Take 100 mg by mouth at bedtime.     . ramipril (ALTACE) 10 MG capsule TAKE 1 CAPSULE BY MOUTH  TWICE DAILY 180 capsule  2  . Riboflavin 100 MG TABS Take 2 tablets (200 mg total) by mouth daily. 60 tablet 1  . simvastatin (ZOCOR) 20 MG tablet TAKE 1 TABLET BY MOUTH AT  BEDTIME 90 tablet 2  . sodium chloride (MURO 128) 5 % ophthalmic ointment Place 1 drop into both eyes at bedtime. For dry eyes    . traMADol (ULTRAM) 50 MG tablet Take 100 mg by mouth every 6 (six) hours as needed (MIGRAINES).    [name] venlafaxine (EFFEXOR) 100 MG tablet Take 100-200 mg by mouth See admin instructions. 200 mg in the morning then 100 mg at noon    . isosorbide mononitrate (IMDUR) 30 MG 24 hr tablet TAKE 1 TABLET BY MOUTH  DAILY 90 tablet 3   No facility-administered medications prior to visit.      Allergies:   Calcium channel blockers; Codeine; Other; Ticlid [ticlopidine hcl]; Morphine and related; Tape; Digoxin and related; Metolazone; Myrbetriq [mirabegron]; Onglyza [saxagliptin]; Penicillins; Spironolactone; Sulfa antibiotics; Tetracyclines & related; Vicodin [hydrocodone-acetaminophen]; and Latex   Social History   Social History  . Marital status: Married    Spouse name: [name]  . Number of children: 1  . Years of education: MA   Occupational History  .  Unemployed    Disablity   Social History Main Topics  . Smoking status: Never Smoker  . Smokeless tobacco: Never Used  . Alcohol use No  . Drug use: No  . Sexual activity: Yes    Birth control/ protection: Surgical   Other Topics Concern  . None   Social History  Narrative   Patient lives at home with spouse.     Daughter name is Tourist information centre manager.   Caffeine Use: none     Family History:  The patient's family history includes Diabetes in her brother, father, and paternal grandmother; Heart attack in her brother, father, and mother; Heart disease in her brother, father, and mother; Hypertension in her father and mother; Kidney failure in her brother and father; Stroke in her father and mother.   ROS:   Please see the history of present illness.    Vision disturbance, chest pain as described above, unexplained weight gain, excessive fatigue, anxiety, and dizziness.  All other systems reviewed and are negative.   PHYSICAL EXAM:   VS:  BP 118/64   Pulse 80   Ht 5' 4.5" (1.638 m)   Wt 183 lb (83 kg)   BMI 30.93 kg/m    GEN: Well nourished, well developed, in no acute distress  HEENT: normal  Neck: no JVD, carotid bruits, or masses Cardiac: RRR; no murmurs, rubs, or gallops,no edema  Respiratory:  clear to auscultation bilaterally, normal work of breathing GI: soft, nontender, nondistended, + BS MS: no deformity or atrophy  Skin: warm and dry, no rash Neuro:  Alert and Oriented x 3, Strength and sensation are intact Psych: euthymic mood, full affect  Wt Readings from Last 3 Encounters:  01/31/17 183 lb (83 kg)  10/04/16 184 lb 12.8 oz (83.8 kg)  08/01/16 175 lb 11.2 oz (79.7 kg)      Studies/Labs Reviewed:   EKG:  EKG  Not repeated  Recent Labs: 07/31/2016: BUN 10; Creatinine, Ser 1.50; Hemoglobin 11.0; Platelets 161; Potassium 3.9; Sodium 145   Lipid Panel    Component Value Date/Time   CHOL 133 01/02/2016 0730   TRIG 131 01/02/2016 0730   HDL 41 01/02/2016 0730   CHOLHDL 3.2 01/02/2016 0730   VLDL 26 01/02/2016 0730   LDLCALC  66 01/02/2016 0730    Additional studies/ records that were reviewed today include:  Nuclear stress test 08/01/16: IMPRESSION: 1. No reversible ischemia or infarction.  2. Normal left ventricular wall  motion.  3. Left ventricular ejection fraction 50%  4. Non invasive risk stratification*: Low    ASSESSMENT:    1. Coronary artery disease involving coronary bypass graft of native heart with angina pectoris ([place])   2. Chronic diastolic heart failure ([place])   3. Essential hypertension   4. MVP (mitral valve prolapse)   5. Paroxysmal SVT (supraventricular tachycardia) ([facility])   6. Cerebral infarction due to thrombosis of basilar artery ([facility])      PLAN:  In order of problems listed above:  1. Class III angina. Increase Imdur to 60 mg per day. More liberal use of sublingual nitroglycerin. If anginal grade does not improve with intensification of therapy. She will need to have repeat coronary angiography. 2. No evidence of volume overload. Encouraged her to weigh daily. Call if increasing shortness of breath. 3. Blood pressure is under excellent control. 2 g sodium diet. 4. No significant regurgitation noted.   Clinical management of angina. Optimize nitrate therapy. Clinical follow-up in 3 months. If no improvement in symptoms, will need repeat coronary angioma.    Medication Adjustments/Labs and Tests Ordered: Current medicines are reviewed at length with the patient today.  Concerns regarding medicines are outlined above.  Medication changes, Labs and Tests ordered today are listed in the Patient Instructions below. Patient Instructions  Medication Instructions:  1) INCREASE Imdur to 60mg  once daily 2) Try taking your Nitro if you have chest discomfort that is lasting longer than 5 minutes  Labwork: None  Testing/Procedures: None  Follow-Up: Your physician recommends that you schedule a follow-up appointment in: 3 months with Dr. Tamala Julian.    Any Other Special Instructions Will Be Listed Below (If Applicable).     If you need a refill on your cardiac medications before your next appointment, please call your pharmacy.      Signed, Sinclair Grooms, MD   01/31/2017 4:05 PM    [facility], [place], [place]  [address] Phone: [phone]; Fax: [phone]

## 2017-01-31 ENCOUNTER — Encounter: Payer: Self-pay | Admitting: Interventional Cardiology

## 2017-01-31 ENCOUNTER — Ambulatory Visit (INDEPENDENT_AMBULATORY_CARE_PROVIDER_SITE_OTHER): Payer: Medicare Other | Admitting: Interventional Cardiology

## 2017-01-31 VITALS — BP 118/64 | HR 80 | Ht 64.5 in | Wt 183.0 lb

## 2017-01-31 DIAGNOSIS — I25709 Atherosclerosis of coronary artery bypass graft(s), unspecified, with unspecified angina pectoris: Secondary | ICD-10-CM

## 2017-01-31 DIAGNOSIS — I255 Ischemic cardiomyopathy: Secondary | ICD-10-CM

## 2017-01-31 DIAGNOSIS — I209 Angina pectoris, unspecified: Secondary | ICD-10-CM | POA: Diagnosis not present

## 2017-01-31 DIAGNOSIS — I6302 Cerebral infarction due to thrombosis of basilar artery: Secondary | ICD-10-CM

## 2017-01-31 DIAGNOSIS — I471 Supraventricular tachycardia: Secondary | ICD-10-CM | POA: Diagnosis not present

## 2017-01-31 DIAGNOSIS — I341 Nonrheumatic mitral (valve) prolapse: Secondary | ICD-10-CM

## 2017-01-31 DIAGNOSIS — I5032 Chronic diastolic (congestive) heart failure: Secondary | ICD-10-CM | POA: Diagnosis not present

## 2017-01-31 DIAGNOSIS — I10 Essential (primary) hypertension: Secondary | ICD-10-CM

## 2017-01-31 MED ORDER — ISOSORBIDE MONONITRATE ER 60 MG PO TB24: 60.0000 mg | ORAL_TABLET | Freq: Every day | ORAL | 3 refills | Status: DC

## 2017-01-31 NOTE — Patient Instructions (Signed)
Medication Instructions:  1) INCREASE Imdur to 60mg  once daily 2) Try taking your Nitro if you have chest discomfort that is lasting longer than 5 minutes  Labwork: None  Testing/Procedures: None  Follow-Up: Your physician recommends that you schedule a follow-up appointment in: 3 months with Dr. Tamala Julian.    Any Other Special Instructions Will Be Listed Below (If Applicable).     If you need a refill on your cardiac medications before your next appointment, please call your pharmacy.

## 2017-02-10 ENCOUNTER — Ambulatory Visit (INDEPENDENT_AMBULATORY_CARE_PROVIDER_SITE_OTHER): Payer: Medicare Other | Admitting: *Deleted

## 2017-02-10 DIAGNOSIS — I255 Ischemic cardiomyopathy: Secondary | ICD-10-CM

## 2017-02-10 LAB — CUP PACEART REMOTE DEVICE CHECK
Battery Remaining Longevity: 98 mo
Battery Voltage: 3 V
Brady Statistic AS VP Percent: 98.55 %
Brady Statistic RA Percent Paced: 0.03 %
Brady Statistic RV Percent Paced: 0.37 %
HIGH POWER IMPEDANCE MEASURED VALUE: 37 Ohm
HighPow Impedance: 43 Ohm
Implantable Lead Implant Date: 20090610
Implantable Lead Location: 753860
Implantable Lead Model: 4194
Implantable Lead Model: 5076
Implantable Lead Model: 6947
Implantable Pulse Generator Implant Date: 20160323
Lead Channel Impedance Value: 418 Ohm
Lead Channel Impedance Value: 456 Ohm
Lead Channel Impedance Value: 513 Ohm
Lead Channel Pacing Threshold Amplitude: 0.75 V
Lead Channel Pacing Threshold Amplitude: 0.875 V
Lead Channel Pacing Threshold Pulse Width: 0.4 ms
Lead Channel Sensing Intrinsic Amplitude: 2.5 mV
Lead Channel Sensing Intrinsic Amplitude: 2.5 mV
Lead Channel Sensing Intrinsic Amplitude: 3.125 mV
Lead Channel Setting Pacing Amplitude: 2 V
Lead Channel Setting Pacing Amplitude: 2 V
MDC IDC LEAD IMPLANT DT: 20090610
MDC IDC LEAD IMPLANT DT: 20090610
MDC IDC LEAD LOCATION: 753858
MDC IDC LEAD LOCATION: 753859
MDC IDC MSMT LEADCHNL LV IMPEDANCE VALUE: 285 Ohm
MDC IDC MSMT LEADCHNL LV IMPEDANCE VALUE: 665 Ohm
MDC IDC MSMT LEADCHNL RA PACING THRESHOLD AMPLITUDE: 0.625 V
MDC IDC MSMT LEADCHNL RA PACING THRESHOLD PULSEWIDTH: 0.4 ms
MDC IDC MSMT LEADCHNL RV IMPEDANCE VALUE: 342 Ohm
MDC IDC MSMT LEADCHNL RV PACING THRESHOLD PULSEWIDTH: 0.4 ms
MDC IDC MSMT LEADCHNL RV SENSING INTR AMPL: 3.125 mV
MDC IDC SESS DTM: 20180405073524
MDC IDC SET LEADCHNL LV PACING PULSEWIDTH: 0.4 ms
MDC IDC SET LEADCHNL RA PACING AMPLITUDE: 1.5 V
MDC IDC SET LEADCHNL RV PACING PULSEWIDTH: 0.4 ms
MDC IDC SET LEADCHNL RV SENSING SENSITIVITY: 0.3 mV
MDC IDC STAT BRADY AP VP PERCENT: 0.02 %
MDC IDC STAT BRADY AP VS PERCENT: 0.01 %
MDC IDC STAT BRADY AS VS PERCENT: 1.41 %

## 2017-02-10 NOTE — Progress Notes (Signed)
Remote ICD transmission.   

## 2017-02-11 ENCOUNTER — Encounter: Payer: Self-pay | Admitting: Cardiology

## 2017-02-25 ENCOUNTER — Encounter: Payer: Self-pay | Admitting: Cardiology

## 2017-03-18 ENCOUNTER — Ambulatory Visit: Payer: Medicare Other | Admitting: Neurology

## 2017-03-22 DIAGNOSIS — I10 Essential (primary) hypertension: Secondary | ICD-10-CM | POA: Diagnosis not present

## 2017-03-22 DIAGNOSIS — Z79899 Other long term (current) drug therapy: Secondary | ICD-10-CM | POA: Diagnosis not present

## 2017-03-22 DIAGNOSIS — Z1389 Encounter for screening for other disorder: Secondary | ICD-10-CM | POA: Diagnosis not present

## 2017-03-22 DIAGNOSIS — E7800 Pure hypercholesterolemia, unspecified: Secondary | ICD-10-CM | POA: Diagnosis not present

## 2017-03-22 DIAGNOSIS — I5022 Chronic systolic (congestive) heart failure: Secondary | ICD-10-CM | POA: Diagnosis not present

## 2017-03-22 DIAGNOSIS — E1142 Type 2 diabetes mellitus with diabetic polyneuropathy: Secondary | ICD-10-CM | POA: Diagnosis not present

## 2017-03-22 DIAGNOSIS — I2510 Atherosclerotic heart disease of native coronary artery without angina pectoris: Secondary | ICD-10-CM | POA: Diagnosis not present

## 2017-03-22 DIAGNOSIS — E1151 Type 2 diabetes mellitus with diabetic peripheral angiopathy without gangrene: Secondary | ICD-10-CM | POA: Diagnosis not present

## 2017-03-22 DIAGNOSIS — Z0000 Encounter for general adult medical examination without abnormal findings: Secondary | ICD-10-CM | POA: Diagnosis not present

## 2017-03-22 DIAGNOSIS — N183 Chronic kidney disease, stage 3 (moderate): Secondary | ICD-10-CM | POA: Diagnosis not present

## 2017-03-23 ENCOUNTER — Encounter: Payer: Self-pay | Admitting: Gynecology

## 2017-03-23 DIAGNOSIS — M25511 Pain in right shoulder: Secondary | ICD-10-CM | POA: Diagnosis not present

## 2017-03-23 DIAGNOSIS — M7501 Adhesive capsulitis of right shoulder: Secondary | ICD-10-CM | POA: Diagnosis not present

## 2017-03-27 ENCOUNTER — Other Ambulatory Visit: Payer: Self-pay | Admitting: Interventional Cardiology

## 2017-03-28 ENCOUNTER — Ambulatory Visit: Payer: Medicare Other | Admitting: Neurology

## 2017-03-30 DIAGNOSIS — K449 Diaphragmatic hernia without obstruction or gangrene: Secondary | ICD-10-CM | POA: Diagnosis not present

## 2017-03-30 DIAGNOSIS — R110 Nausea: Secondary | ICD-10-CM | POA: Diagnosis not present

## 2017-03-30 DIAGNOSIS — R61 Generalized hyperhidrosis: Secondary | ICD-10-CM | POA: Diagnosis not present

## 2017-03-30 DIAGNOSIS — E1121 Type 2 diabetes mellitus with diabetic nephropathy: Secondary | ICD-10-CM | POA: Diagnosis not present

## 2017-03-30 DIAGNOSIS — Z794 Long term (current) use of insulin: Secondary | ICD-10-CM | POA: Diagnosis not present

## 2017-04-01 ENCOUNTER — Other Ambulatory Visit: Payer: Self-pay | Admitting: Interventional Cardiology

## 2017-04-11 ENCOUNTER — Ambulatory Visit (INDEPENDENT_AMBULATORY_CARE_PROVIDER_SITE_OTHER): Payer: Medicare Other | Admitting: Neurology

## 2017-04-11 ENCOUNTER — Encounter: Payer: Self-pay | Admitting: Neurology

## 2017-04-11 VITALS — BP 110/72 | HR 66 | Ht 64.5 in | Wt 176.0 lb

## 2017-04-11 DIAGNOSIS — I6990 Unspecified sequelae of unspecified cerebrovascular disease: Secondary | ICD-10-CM

## 2017-04-11 DIAGNOSIS — I255 Ischemic cardiomyopathy: Secondary | ICD-10-CM

## 2017-04-11 NOTE — Patient Instructions (Signed)
I had a long d/w patient about her remote stroke stroke, risk for recurrent stroke/TIAs, personally independently reviewed imaging studies and stroke evaluation results and answered questions.Continue aspirin 325 mg daily and clopidogrel 75 mg daily  for secondary stroke prevention and maintain strict control of hypertension with blood pressure goal below 130/90, diabetes with hemoglobin A1c goal below 6.5% and lipids with LDL cholesterol goal below 70 mg/dL. I also advised the patient to eat a healthy diet with plenty of whole grains, cereals, fruits and vegetables, exercise regularly and maintain ideal body weight. Since it has been nearly 4 years since her last stroke no routine scheduled follow-up appointment with me is necessary. She was advised to follow-up with her primary physician and she may be referred back in the future only as necessary.

## 2017-04-11 NOTE — Progress Notes (Signed)
[facility]  PATIENT: [name] DOB: 1951-01-06   REASON FOR VISIT: Follow-up for history of stroke  HISTORY FROM: Patient    HISTORY OF PRESENT ILLNESS: HISTORY PSVenice T [name] is an 66 y.o. female who on 11/11/12 awoke with left sided weakness and difficulty with her speech.  Although patient could not have a MRI due to AICD it was felt by stroke team she did suffer a CVA. At that time TEE was negative for thrombus or PFO and it was felt Pradaxa was not indicated and was placed on both Plavix and ASA. Today patient was in cardiac rehab when at 0830 she noted left arm paresthesia which then progressed to both left arm and leg over a 20 minute period of time. Code stroke was called and patient was brought to CT. CT head was negative for acute CVA. On examination her NIHSS was 1 and patient continued to feel left arm, leg and face decreased sensation.   UPDATE 06/08/13 (LL): Patient comes to office for stroke follow up. She reports that she is doing very well and has had no new TIA symptoms (left arm and leg weakness) since the last episode while at cardiac rehab on 02/15/13. She has mild word hesitancy when she speaks. She takes Plavix and ASA 325 mg daily. Patient denies medication side effects, with no signs of bleeding or bruising. No new complaints.  UPDATE 02/11/14 (LL): Patient comes to office for stroke follow up. She is doing well without any repeat stroke or TIA symptoms. Her husband, daughter, and brother have all had heart problems recently and she is helping to care for all of them. Her daughter had to have an pacemaker implanted, her brother and husband both had MIs and needed bypass surgery. She takes Plavix and ASA 325 mg daily. Patient denies medication side effects, with no signs of bleeding or bruising. No new complaints.  Update 09/18/2015 PS: She is seen for follow-up today after last visit a year and a half ago. She continues to do well  without recurrent stroke or TIA symptoms now. She is concerned about an upcoming schedule colonoscopy on 10/07/15 and risk for TIA/stroke while she has to hold her antiplatelet medications for 5 days. She had a possible TIA episode 2 years ago when she had to hold her antiplatelet drugs for her scheduled injection in her spine. Patient recently had a stool test which was positive and there is concern for cancer and hence colonoscopy is necessary. She states her blood pressure is well controlled and usually runs in the 120s though it is slightly elevated today at 135/83. She is tolerating Zocor quite well and had lipid profile checked by her primary physician 3 weeks ago and was fine. She had a good summer and returned from a long trip in [place]. She recently had her pacemaker changed and the procedure went well. She is on aspirin and Plavix and tolerating it well without bleeding or bruising  UPDATE 05/12/2017CM [name] , 66 year old female returns for follow-up she continues to do well without further stroke or TIA symptoms ,she was admitted to the hospital briefly back in February. She had multiple neurologic complaints without definite localized findings. CT of the head revealed chronic lacunar infarction. MRI cannot be obtained due to AICD. She remains on aspirin and Plavix. Blood pressure is well controlled in the office today at 134/74. Recent hemoglobin A1c 6.9. Recent LDL 66. She gets no regular exercise she returns for reevaluation Update 04/11/2017 :  She returns for follow-up after last visit a year ago. She continues to do well without recurrent stroke or TIA symptoms since 2014. She was admitted in September 17 chest pain and had a cardiac workup. She had improvement in her chest pain after dose of Imdur was increased by her cardiologist. Remains on aspirin and Plavix and is tolerating well without bruising or bleeding. She states her blood pressure is well controlled. Her diabetes is also well  controlled with last A1c being 6.0 a month ago. She is tolerating simvastatin without significant muscle aches or pains. Last lipid profile was also satisfactory. She has no new neurological complaints. She has lost some weight she plans to be more active and exercise regularly REVIEW OF SYSTEMS: Full 14 system review of systems performed and notable only for those listed, all others are neg:   Appetite change, fatigue, trouble swallowing, double vision, blurred vision, shortness of breath, chest pain and palpitations, heat intolerance, excessive thirst, nausea, diarrhea, vomiting, restless legs, incontinence, joint and back pain, depression and nervousness and anxiety  ALLERGIES: Allergies  Allergen Reactions  . Calcium Channel Blockers Other (See Comments)    Cannot take non-DHP CCBs (diltiazem, verapamil) due to CHF requiring ICD  . Codeine Anaphylaxis  . Lyrica [Pregabalin]     Nightmares, and swelling  . Other Anaphylaxis    Walnuts, pecans, and ANY melons PATIENT IS A VEGAN  . Ticlid [Ticlopidine Hcl] Other (See Comments)    Unprovoked bleeding while on Ticlid (doesn't think she was on ASA at the time) Takes Plavix without problems  . Morphine And Related Other (See Comments)    Severe AMS, confusion, weakness Tolerates Fentanyl, Tramadol  . Tape Dermatitis and Rash    Tolerates paper tape  . Digoxin And Related Nausea Only    Unknown  . Metolazone Other (See Comments)    Cannot remember reaction  . Myrbetriq [Mirabegron] Other (See Comments)    Aggravates migraines  . Onglyza [Saxagliptin] Other (See Comments)    Exacerbates migraines  . Penicillins Hives    Has patient had a PCN reaction causing immediate rash, facial/tongue/throat swelling, SOB or lightheadedness with hypotension: Yes Has patient had a PCN reaction causing severe rash involving mucus membranes or skin necrosis: Unknown Has patient had a PCN reaction that required hospitalization: Unknown Has patient had a  PCN reaction occurring within the last 10 years: No If all of the above answers are "NO", then may proceed with Cephalosporin use.   [name] Spironolactone Other (See Comments)    Cannot remember reaction  . Sulfa Antibiotics Hives and Other (See Comments)  . Tetracyclines & Related Other (See Comments)    Cannot remember reaction Tolerates macrolides (azithromycin)  . Vicodin [Hydrocodone-Acetaminophen] Other (See Comments)    EXTREME LETHARGY  . Latex Itching and Rash    HOME MEDICATIONS: Outpatient Medications Prior to Visit  Medication Sig Dispense Refill  . albuterol (PROVENTIL HFA;VENTOLIN HFA) 108 (90 BASE) MCG/ACT inhaler Inhale 2 puffs into the lungs every 4 (four) hours as needed for wheezing or shortness of breath.    . ALPRAZolam (XANAX) 0.5 MG tablet Take 0.5-1 tablets by mouth 3 (three) times daily. 1 mg in the morning then 0.5 mg at noon then 0.5 mg at bedtime    . Artificial Tear GEL Place 2 drops into both eyes daily as needed (dry eyes).     [name] aspirin 325 MG EC tablet Take 325 mg by mouth daily.      . cetirizine (ZYRTEC)  10 MG tablet Take 10 mg by mouth daily.    . Cholecalciferol (VITAMIN D-3) 1000 UNITS CAPS Take 1 capsule by mouth daily.     . clopidogrel (PLAVIX) 75 MG tablet TAKE 1 TABLET BY MOUTH  DAILY 90 tablet 2  . cyclobenzaprine (FLEXERIL) 5 MG tablet Take 1 tablet (5 mg total) by mouth at bedtime as needed for muscle spasms. 30 tablet 0  . doxazosin (CARDURA) 4 MG tablet Take 4 mg by mouth at bedtime.     [name] ezetimibe (ZETIA) 10 MG tablet TAKE 1 TABLET BY MOUTH  DAILY 90 tablet 2  . fluticasone (FLONASE) 50 MCG/ACT nasal spray Place 2 sprays into both nostrils daily as needed for allergies or rhinitis.     . furosemide (LASIX) 80 MG tablet TAKE 1 TABLET BY MOUTH 2  TIMES DAILY. MAY TAKE AN  ADDITIONAL 80MG  (1 TABLET)  AS NEEDED FOR EDEMA. 180 tablet 2  . Insulin Glargine (BASAGLAR KWIKPEN) 100 UNIT/ML SOPN Inject 20 Units into the skin at bedtime. Reported on  02/18/2016     . isosorbide mononitrate (IMDUR) 60 MG 24 hr tablet Take 1 tablet (60 mg total) by mouth daily. 90 tablet 3  . metoprolol (LOPRESSOR) 100 MG tablet TAKE 1 TABLET BY MOUTH TWO  TIMES DAILY 180 tablet 2  . Multiple Vitamin (MULTIVITAMIN WITH MINERALS) TABS Take 1 tablet by mouth daily.    [name] NITROSTAT 0.4 MG SL tablet Place 1 tablet under the tongue every 5 minutes as needed for chest pain, max 3 doses, go to er if no relief 25 tablet 2  . ondansetron (ZOFRAN ODT) 4 MG disintegrating tablet Take 1 tablet (4 mg total) by mouth every 8 (eight) hours as needed for nausea or vomiting. 12 tablet 0  . pantoprazole (PROTONIX) 40 MG tablet Take 40 mg by mouth daily.    . potassium chloride (K-DUR) 10 MEQ tablet Take 2 tablets (20 mEq total) by mouth 2 (two) times daily. 360 tablet 1  . ramipril (ALTACE) 10 MG capsule TAKE 1 CAPSULE BY MOUTH  TWICE DAILY 180 capsule 2  . Riboflavin 100 MG TABS Take 2 tablets (200 mg total) by mouth daily. 60 tablet 1  . simvastatin (ZOCOR) 20 MG tablet TAKE 1 TABLET BY MOUTH AT  BEDTIME 90 tablet 2  . sodium chloride (MURO 128) 5 % ophthalmic ointment Place 1 drop into both eyes at bedtime. For dry eyes    . traMADol (ULTRAM) 50 MG tablet Take 100 mg by mouth every 6 (six) hours as needed (MIGRAINES).    [name] venlafaxine (EFFEXOR) 100 MG tablet Take 100-200 mg by mouth See admin instructions. 200 mg in the morning then 100 mg at noon    . pregabalin (LYRICA) 50 MG capsule Take 100 mg by mouth at bedtime.      No facility-administered medications prior to visit.     PAST MEDICAL HISTORY: Past Medical History:  Diagnosis Date  . AICD (automatic cardioverter/defibrillator) present    [employer]- Dr. Beckie Salts follows  . Anginal pain ([place])   . Anxiety   . Asthma   . Back pain    "pinched nerve-lower back" - Dr. Nelva Bush follows.  . Cervical dysplasia   . CHF (congestive heart failure) ([place])   . Chronic kidney disease    Dr. Posey Pronto follows.  . Complication of  anesthesia    "I wake up during surgeries" (02/14/2013)  . Coronary artery disease   . Depression   . Fibroid   .  Function kidney decreased   . GERD (gastroesophageal reflux disease)   . Hepatitis    Hepatitis A -college yrs"water source exposure"  . Hiatal hernia   . History of shingles    2-3 yrs ago last out break "around waist"  . History of stomach ulcers   . Hypertension   . ICD (implantable cardiac defibrillator) in place   . Iron deficiency anemia   . Ischemic cardiomyopathy    status post biventricular ICD placed by DR Edumunds who used to see Dr Melvern Banker here to establish  cardiovascular care.  . Migraines   . MVP (mitral valve prolapse)    Antibiotics not required for procedures  . Myocardial infarction ([place])    "I've had 2; the others they were able to catch before completing" (02/14/2013)  . Pacemaker   . Pneumonia 1950's & 1985  . Shortness of breath    "lying down flat; at times w/exertion" (02/14/2013). 10-06-15 exertion only..  . Stroke [facility])    "2 confirmed; 9 TIA's; results in dragging LLE; numbness in tip of tongue" (02/14/2013),10-06-15 right hand tends to be weaker when tired.  . Type II diabetes mellitus ([place])     PAST SURGICAL HISTORY: Past Surgical History:  Procedure Laterality Date  . ABDOMINAL HYSTERECTOMY  1985   TAH   . BIV ICD GENERTAOR CHANGE OUT  06/2007; 04/2008   "2 lead initial placement, at [facility]; done at [facility], after developing CHF" (02/14/2013)  . BIV PACEMAKER GENERATOR CHANGE OUT N/A 01/29/2015   Procedure: BIV PACEMAKER GENERATOR CHANGE OUT;  Surgeon: Evans Lance, MD;  Location: [facility] CATH LAB;  Service: Cardiovascular;  Laterality: N/A;  . BREAST EXCISIONAL BIOPSY Left 01/2007; 06/2007; 03/2008   "benign" (02/14/2013)  . BREAST SURGERY    . CARDIAC CATHETERIZATION     "probably in the teens" (02/14/2013)  . CARDIAC DEFIBRILLATOR PLACEMENT    . COLONOSCOPY  ~ 2002  . COLONOSCOPY WITH PROPOFOL N/A 10/07/2015   Procedure: COLONOSCOPY WITH PROPOFOL;   Surgeon: Juanita Craver, MD;  Location: [facility] ENDOSCOPY;  Service: Endoscopy;  Laterality: N/A;  . CORONARY ANGIOPLASTY WITH STENT PLACEMENT     "started out w/5; bypass corrected some; 1 stent since the bypass" (02/14/2013)  . CORONARY ARTERY BYPASS GRAFT  ` 1998   LIMA-LAD, SVG-D1, SVG-PDA  . [place] OF UTERUS  1975 X 2; 1976; 1977  . INSERT / REPLACE / REMOVE PACEMAKER     biventricular defibrillator--06/10/ 2009  . LEFT HEART CATHETERIZATION WITH CORONARY/GRAFT ANGIOGRAM N/A 01/03/2015   Procedure: LEFT HEART CATHETERIZATION WITH Beatrix Fetters;  Surgeon: Sinclair Grooms, MD;  Location: [facility] CATH LAB;  Service: Cardiovascular;  Laterality: N/A;  . SUPRAVENTRICULAR TACHYCARDIA ABLATION  06/2007  . TEE WITHOUT CARDIOVERSION  11/14/2012   Procedure: TRANSESOPHAGEAL ECHOCARDIOGRAM (TEE);  Surgeon: Candee Furbish, MD;  Location: [facility] ENDOSCOPY;  Service: Cardiovascular;  Laterality: N/A;    FAMILY HISTORY: Family History  Problem Relation Age of Onset  . Heart disease Mother   . Hypertension Mother   . Heart attack Mother   . Stroke Mother   . Heart disease Father   . Hypertension Father   . Diabetes Father   . Kidney failure Father   . Heart attack Father   . Stroke Father   . Heart disease Brother   . Diabetes Brother   . Kidney failure Brother   . Heart attack Brother   . Diabetes Paternal Grandmother     SOCIAL HISTORY: Social History  Social History  . Marital status: Married    Spouse name: [name]  . Number of children: 1  . Years of education: MA   Occupational History  .  Unemployed    Disablity   Social History Main Topics  . Smoking status: Never Smoker  . Smokeless tobacco: Never Used  . Alcohol use No  . Drug use: No  . Sexual activity: Yes    Birth control/ protection: Surgical   Other Topics Concern  . Not on file   Social History Narrative   Patient lives at home with spouse.     Daughter name is Tourist information centre manager.   Caffeine Use: none      PHYSICAL EXAM  Vitals:   04/11/17 1539  BP: 110/72  Pulse: 66  Weight: 176 lb (79.8 kg)  Height: 5' 4.5" (1.638 m)   Body mass index is 29.74 kg/m. Generalized: ,middle aged african american pleasant AA female.  Neck: Supple, no carotid bruits  Cardiac: Regular rate rhythm, no murmur  Pulmonary: Clear to auscultation bilaterally  Musculoskeletal: No deformity .midline sternal scar from prior surgery  Neurological examination  Mentation: Alert oriented to time, place, history taking, mild word hesitancy Cranial nerve II-XII: Pupils were equal round reactive to light extraocular movements were full, visual field were full on confrontational test. facial sensation and strength were normal. hearing was intact to finger rubbing bilaterally. Uvula tongue midline. head turning and shoulder shrug and were normal and symmetric.Tongue protrusion into cheek strength was normal.  MOTOR: normal bulk and tone, full strength in the BUE, BLE, fine finger movements normal, no pronator drift  SENSORY: normal and symmetric to light touch, pinprick, temperature, vibration in the upper and lower extremities COORDINATION: finger-nose-finger, heel-to-shin bilaterally, there was no truncal ataxia  REFLEXES: Symmetric upper and lower, plantar responses were flexor bilaterally.  GAIT/STATION: Rising up from seated position without assistance, normal stance,   able to perform tiptoe, and heel walking without difficulty. No difficulty with  tandem walking  DIAGNOSTIC DATA (LABS, IMAGING, TESTING) - I reviewed patient records, labs, notes, testing and imaging myself where available.  Lab Results  Component Value Date   WBC 7.2 07/31/2016   HGB 11.0 (L) 07/31/2016   HCT 33.7 (L) 07/31/2016   MCV 86.2 07/31/2016   PLT 161 07/31/2016      Component Value Date/Time   NA 145 07/31/2016 0522   K 3.9 07/31/2016 0522   CL 100 (L) 07/31/2016 0522   CO2 32 07/31/2016 0522   GLUCOSE 93  07/31/2016 0522   BUN 10 07/31/2016 0522   CREATININE 1.50 (H) 07/31/2016 0522   CALCIUM 9.3 07/31/2016 0522   PROT 6.9 01/01/2016 1608   ALBUMIN 3.7 01/01/2016 1608   AST 30 01/01/2016 1608   ALT 26 01/01/2016 1608   ALKPHOS 73 01/01/2016 1608   BILITOT 0.5 01/01/2016 1608   GFRNONAA 36 (L) 07/31/2016 0522   GFRAA 42 (L) 07/31/2016 0522   Lab Results  Component Value Date   CHOL 133 01/02/2016   HDL 41 01/02/2016   LDLCALC 66 01/02/2016   TRIG 131 01/02/2016   CHOLHDL 3.2 01/02/2016   Lab Results  Component Value Date   HGBA1C 6.9 (H) 01/02/2016    ASSESSMENT AND PLAN 66 y.o. AA female with likely subcortical stroke on 11/11/12.  . Patient with multiple vascular risk factors. Stroke Risk Factors - PSVT diabetes mellitus, hypertension,  CAD s/p CABG. She has had no recurrent TIA or stroke symptoms.  PLAN:I had a long  d/w patient about her remote stroke stroke, risk for recurrent stroke/TIAs, personally independently reviewed imaging studies and stroke evaluation results and answered questions.Continue aspirin 325 mg daily and clopidogrel 75 mg daily  for secondary stroke prevention and maintain strict control of hypertension with blood pressure goal below 130/90, diabetes with hemoglobin A1c goal below 6.5% and lipids with LDL cholesterol goal below 70 mg/dL. I also advised the patient to eat a healthy diet with plenty of whole grains, cereals, fruits and vegetables, exercise regularly and maintain ideal body weight. Since it has been nearly 4 years since her last stroke no routine scheduled follow-up appointment with me is necessary. She was advised to follow-up with her primary physician and she may be referred back in the future only as necessary. Antony Contras, MD [facility] [address], [place], [place] [address] [phone]

## 2017-04-27 DIAGNOSIS — M7501 Adhesive capsulitis of right shoulder: Secondary | ICD-10-CM | POA: Diagnosis not present

## 2017-05-01 NOTE — Progress Notes (Signed)
Cardiology Office Note    Date:  05/02/2017   ID:  [name], [name] 22-Apr-1951, MRN [id number]  PCP:  [name], MD  Cardiologist: Sinclair Grooms, MD   Chief Complaint  Patient presents with  . Coronary Artery Disease  . Congestive Heart Failure    History of Present Illness:  [name] is a 66 y.o. female  presents for CAD, CHF, Defibrillator, ischemic cardiomyopathy, mitral valve disease with moderate regurgitation, diabetes mellitus, and ischemic stroke history.   She feels better. She is breathing better. She and her daughter identified liver, was causing fluid retention and bloating. Nearly 2 months ago the medication was discontinued, there was near immediate reduction in weight, and breathing improved. Overall she feels better. One episode of chest pain occurred during an aggressive conversation/argument but otherwise asymptomatic. Overall she feels much better than when I saw her 3 months ago.    Past Medical History:  Diagnosis Date  . AICD (automatic cardioverter/defibrillator) present    [employer]- Dr. Beckie Salts follows  . Anginal pain ([place])   . Anxiety   . Asthma   . Back pain    "pinched nerve-lower back" - Dr. Nelva Bush follows.  . Cervical dysplasia   . CHF (congestive heart failure) ([place])   . Chronic kidney disease    Dr. Posey Pronto follows.  . Complication of anesthesia    "I wake up during surgeries" (02/14/2013)  . Coronary artery disease   . Depression   . Fibroid   . Function kidney decreased   . GERD (gastroesophageal reflux disease)   . Hepatitis    Hepatitis A -college yrs"water source exposure"  . Hiatal hernia   . History of shingles    2-3 yrs ago last out break "around waist"  . History of stomach ulcers   . Hypertension   . ICD (implantable cardiac defibrillator) in place   . Iron deficiency anemia   . Ischemic cardiomyopathy    status post biventricular ICD placed by DR Edumunds who used to see Dr Melvern Banker here to  establish  cardiovascular care.  . Migraines   . MVP (mitral valve prolapse)    Antibiotics not required for procedures  . Myocardial infarction ([place])    "I've had 2; the others they were able to catch before completing" (02/14/2013)  . Pacemaker   . Pneumonia 1950's & 1985  . Shortness of breath    "lying down flat; at times w/exertion" (02/14/2013). 10-06-15 exertion only..  . Stroke [facility])    "2 confirmed; 9 TIA's; results in dragging LLE; numbness in tip of tongue" (02/14/2013),10-06-15 right hand tends to be weaker when tired.  . Type II diabetes mellitus ([place])     Past Surgical History:  Procedure Laterality Date  . ABDOMINAL HYSTERECTOMY  1985   TAH   . BIV ICD GENERTAOR CHANGE OUT  06/2007; 04/2008   "2 lead initial placement, at [facility]; done at [facility], after developing CHF" (02/14/2013)  . BIV PACEMAKER GENERATOR CHANGE OUT N/A 01/29/2015   Procedure: BIV PACEMAKER GENERATOR CHANGE OUT;  Surgeon: Evans Lance, MD;  Location: [facility] CATH LAB;  Service: Cardiovascular;  Laterality: N/A;  . BREAST EXCISIONAL BIOPSY Left 01/2007; 06/2007; 03/2008   "benign" (02/14/2013)  . BREAST SURGERY    . CARDIAC CATHETERIZATION     "probably in the teens" (02/14/2013)  . CARDIAC DEFIBRILLATOR PLACEMENT    . COLONOSCOPY  ~ 2002  . COLONOSCOPY WITH PROPOFOL N/A 10/07/2015   Procedure: COLONOSCOPY WITH  PROPOFOL;  Surgeon: Juanita Craver, MD;  Location: [facility] ENDOSCOPY;  Service: Endoscopy;  Laterality: N/A;  . CORONARY ANGIOPLASTY WITH STENT PLACEMENT     "started out w/5; bypass corrected some; 1 stent since the bypass" (02/14/2013)  . CORONARY ARTERY BYPASS GRAFT  ` 1998   LIMA-LAD, SVG-D1, SVG-PDA  . [place] OF UTERUS  1975 X 2; 1976; 1977  . INSERT / REPLACE / REMOVE PACEMAKER     biventricular defibrillator--06/10/ 2009  . LEFT HEART CATHETERIZATION WITH CORONARY/GRAFT ANGIOGRAM N/A 01/03/2015   Procedure: LEFT HEART CATHETERIZATION WITH Beatrix Fetters;  Surgeon: Sinclair Grooms, MD;   Location: [facility] CATH LAB;  Service: Cardiovascular;  Laterality: N/A;  . SUPRAVENTRICULAR TACHYCARDIA ABLATION  06/2007  . TEE WITHOUT CARDIOVERSION  11/14/2012   Procedure: TRANSESOPHAGEAL ECHOCARDIOGRAM (TEE);  Surgeon: Candee Furbish, MD;  Location: [facility] ENDOSCOPY;  Service: Cardiovascular;  Laterality: N/A;    Current Medications: Outpatient Medications Prior to Visit  Medication Sig Dispense Refill  . albuterol (PROVENTIL HFA;VENTOLIN HFA) 108 (90 BASE) MCG/ACT inhaler Inhale 2 puffs into the lungs every 4 (four) hours as needed for wheezing or shortness of breath.    . ALPRAZolam (XANAX) 0.5 MG tablet Take 0.5-1 tablets by mouth 3 (three) times daily. 1 mg in the morning then 0.5 mg at noon then 0.5 mg at bedtime    . Artificial Tear GEL Place 2 drops into both eyes daily as needed (dry eyes).     [name] aspirin 325 MG EC tablet Take 325 mg by mouth daily.      . cetirizine (ZYRTEC) 10 MG tablet Take 10 mg by mouth daily.    . Cholecalciferol (VITAMIN D-3) 1000 UNITS CAPS Take 1 capsule by mouth daily.     . clopidogrel (PLAVIX) 75 MG tablet TAKE 1 TABLET BY MOUTH  DAILY 90 tablet 2  . cyclobenzaprine (FLEXERIL) 5 MG tablet Take 1 tablet (5 mg total) by mouth at bedtime as needed for muscle spasms. 30 tablet 0  . doxazosin (CARDURA) 4 MG tablet Take 4 mg by mouth at bedtime.     [name] ezetimibe (ZETIA) 10 MG tablet TAKE 1 TABLET BY MOUTH  DAILY 90 tablet 2  . fluticasone (FLONASE) 50 MCG/ACT nasal spray Place 2 sprays into both nostrils daily as needed for allergies or rhinitis.     . furosemide (LASIX) 80 MG tablet TAKE 1 TABLET BY MOUTH 2  TIMES DAILY. MAY TAKE AN  ADDITIONAL 80MG  (1 TABLET)  AS NEEDED FOR EDEMA. 180 tablet 2  . Insulin Glargine (BASAGLAR KWIKPEN) 100 UNIT/ML SOPN Inject 20 Units into the skin at bedtime. Reported on 02/18/2016     . metoprolol (LOPRESSOR) 100 MG tablet TAKE 1 TABLET BY MOUTH TWO  TIMES DAILY 180 tablet 2  . Multiple Vitamin (MULTIVITAMIN WITH MINERALS) TABS Take 1 tablet  by mouth daily.    [name] NITROSTAT 0.4 MG SL tablet Place 1 tablet under the tongue every 5 minutes as needed for chest pain, max 3 doses, go to er if no relief 25 tablet 2  . ondansetron (ZOFRAN) 8 MG tablet Take 8 mg by mouth every 8 (eight) hours as needed for nausea or vomiting.     . pantoprazole (PROTONIX) 40 MG tablet Take 40 mg by mouth daily.    . potassium chloride (K-DUR) 10 MEQ tablet Take 2 tablets (20 mEq total) by mouth 2 (two) times daily. 360 tablet 1  . ramipril (ALTACE) 10 MG capsule TAKE 1 CAPSULE BY MOUTH  TWICE DAILY 180 capsule 2  . Riboflavin 100 MG TABS Take 2 tablets (200 mg total) by mouth daily. 60 tablet 1  . simvastatin (ZOCOR) 20 MG tablet TAKE 1 TABLET BY MOUTH AT  BEDTIME 90 tablet 2  . sodium chloride (MURO 128) 5 % ophthalmic ointment Place 1 drop into both eyes at bedtime. For dry eyes    . traMADol (ULTRAM) 50 MG tablet Take 100 mg by mouth every 6 (six) hours as needed (MIGRAINES).    [name] venlafaxine (EFFEXOR) 100 MG tablet Take 100-200 mg by mouth See admin instructions. 200 mg in the morning then 100 mg at noon    . isosorbide mononitrate (IMDUR) 60 MG 24 hr tablet Take 1 tablet (60 mg total) by mouth daily. 90 tablet 3  . ondansetron (ZOFRAN ODT) 4 MG disintegrating tablet Take 1 tablet (4 mg total) by mouth every 8 (eight) hours as needed for nausea or vomiting. (Patient not taking: Reported on 05/02/2017) 12 tablet 0   No facility-administered medications prior to visit.      Allergies:   Calcium channel blockers; Codeine; Lyrica [pregabalin]; Other; Ticlid [ticlopidine hcl]; Morphine and related; Tape; Digoxin and related; Metolazone; Myrbetriq [mirabegron]; Onglyza [saxagliptin]; Penicillins; Spironolactone; Sulfa antibiotics; Tetracyclines & related; Vicodin [hydrocodone-acetaminophen]; and Latex   Social History   Social History  . Marital status: Married    Spouse name: [name]  . Number of children: 1  . Years of education: MA   Occupational History    .  Unemployed    Disablity   Social History Main Topics  . Smoking status: Never Smoker  . Smokeless tobacco: Never Used  . Alcohol use No  . Drug use: No  . Sexual activity: Yes    Birth control/ protection: Surgical   Other Topics Concern  . None   Social History Narrative   Patient lives at home with spouse.     Daughter name is Tourist information centre manager.   Caffeine Use: none     Family History:  The patient's family history includes Diabetes in her brother, father, and paternal grandmother; Heart attack in her brother, father, and mother; Heart disease in her brother, father, and mother; Hypertension in her father and mother; Kidney failure in her brother and father; Stroke in her father and mother.   ROS:   Please see the history of present illness.    Occasional blood in the stool, abdominal pain, chest pain, appetite change, excessive sweating, nausea, vomiting, and back pain.  All other systems reviewed and are negative.   PHYSICAL EXAM:   VS:  BP 134/80 (BP Location: Right Arm)   Pulse 72   Ht 5' 4.5" (1.638 m)   Wt 177 lb (80.3 kg)   BMI 29.91 kg/m    GEN: Well nourished, well developed, in no acute distress  HEENT: normal  Neck: no JVD, carotid bruits, or masses Cardiac: RRR; no murmurs, rubs, or gallops,no edema  Respiratory:  clear to auscultation bilaterally, normal work of breathing GI: soft, nontender, nondistended, + BS MS: no deformity or atrophy  Skin: warm and dry, no rash Neuro:  Alert and Oriented x 3, Strength and sensation are intact Psych: euthymic mood, full affect  Wt Readings from Last 3 Encounters:  05/02/17 177 lb (80.3 kg)  04/11/17 176 lb (79.8 kg)  01/31/17 183 lb (83 kg)      Studies/Labs Reviewed:   EKG:  EKG  Not repeated  Recent Labs: 07/31/2016: BUN 10; Creatinine, Ser 1.50; Hemoglobin 11.0; Platelets  161; Potassium 3.9; Sodium 145   Lipid Panel    Component Value Date/Time   CHOL 133 01/02/2016 0730   TRIG 131 01/02/2016 0730    HDL 41 01/02/2016 0730   CHOLHDL 3.2 01/02/2016 0730   VLDL 26 01/02/2016 0730   LDLCALC 66 01/02/2016 0730    Additional studies/ records that were reviewed today include:  Most recent creatinine at primary care was 1.9 in May 2018, TSH was normal. LDL cholesterol was 50 03/13/2017.    ASSESSMENT:    1. Coronary artery disease involving coronary bypass graft of native heart with angina pectoris ([place])   2. Essential hypertension   3. Ischemic cardiomyopathy   4. Transient cerebral ischemia, unspecified type   5. Chronic diastolic heart failure (HCC)      PLAN:  In order of problems listed above:  1. Stable with rare episodes of angina, responsive to nitroglycerin. 2. Current adequate control on the medical regimen is listed. 3. No evidence of volume overload 4. No recurrence 5. No evidence of volume overload or need for change in diuretic therapy.  Encouraged to continue to monitor volume status and weight on a daily basis. Call if greater than 3 pound weight increase overnight or greater than 5 pound weight increase in one week. Clinical follow-up in 6 months.    Medication Adjustments/Labs and Tests Ordered: Current medicines are reviewed at length with the patient today.  Concerns regarding medicines are outlined above.  Medication changes, Labs and Tests ordered today are listed in the Patient Instructions below. There are no Patient Instructions on file for this visit.   Signed, Sinclair Grooms, MD  05/02/2017 10:57 AM    [facility] [address], [place], [place]  [address] Phone: [phone]; Fax: [phone]

## 2017-05-02 ENCOUNTER — Encounter: Payer: Self-pay | Admitting: Interventional Cardiology

## 2017-05-02 ENCOUNTER — Ambulatory Visit (INDEPENDENT_AMBULATORY_CARE_PROVIDER_SITE_OTHER): Payer: Medicare Other | Admitting: Interventional Cardiology

## 2017-05-02 VITALS — BP 134/80 | HR 72 | Ht 64.5 in | Wt 177.0 lb

## 2017-05-02 DIAGNOSIS — M7501 Adhesive capsulitis of right shoulder: Secondary | ICD-10-CM | POA: Diagnosis not present

## 2017-05-02 DIAGNOSIS — I25709 Atherosclerosis of coronary artery bypass graft(s), unspecified, with unspecified angina pectoris: Secondary | ICD-10-CM

## 2017-05-02 DIAGNOSIS — I10 Essential (primary) hypertension: Secondary | ICD-10-CM

## 2017-05-02 DIAGNOSIS — I255 Ischemic cardiomyopathy: Secondary | ICD-10-CM

## 2017-05-02 DIAGNOSIS — I5032 Chronic diastolic (congestive) heart failure: Secondary | ICD-10-CM

## 2017-05-02 DIAGNOSIS — I209 Angina pectoris, unspecified: Secondary | ICD-10-CM

## 2017-05-02 DIAGNOSIS — G459 Transient cerebral ischemic attack, unspecified: Secondary | ICD-10-CM

## 2017-05-02 MED ORDER — ASPIRIN EC 81 MG PO TBEC: 81.0000 mg | DELAYED_RELEASE_TABLET | Freq: Every day | ORAL | 3 refills | Status: DC

## 2017-05-02 NOTE — Patient Instructions (Addendum)
Medication Instructions:  None  Labwork: None  Testing/Procedures: None  Follow-Up: Your physician wants you to follow-up in:  6 months with Dr. Tamala Julian.  You will receive a reminder letter in the mail two months in advance. If you don't receive a letter, please call our office to schedule the follow-up appointment.   Any Other Special Instructions Will Be Listed Below (If Applicable).  Weigh yourself on a regular basis at about the same time of day, in the same amount of clothing.  If you notice a significant weight gain or fluid retention, please contact our office.    If you need a refill on your cardiac medications before your next appointment, please call your pharmacy.

## 2017-05-03 ENCOUNTER — Other Ambulatory Visit: Payer: Self-pay | Admitting: Gastroenterology

## 2017-05-03 DIAGNOSIS — K625 Hemorrhage of anus and rectum: Secondary | ICD-10-CM | POA: Diagnosis not present

## 2017-05-03 DIAGNOSIS — K449 Diaphragmatic hernia without obstruction or gangrene: Secondary | ICD-10-CM | POA: Diagnosis not present

## 2017-05-03 DIAGNOSIS — K219 Gastro-esophageal reflux disease without esophagitis: Secondary | ICD-10-CM | POA: Diagnosis not present

## 2017-05-03 DIAGNOSIS — R1011 Right upper quadrant pain: Secondary | ICD-10-CM

## 2017-05-03 DIAGNOSIS — R110 Nausea: Secondary | ICD-10-CM

## 2017-05-03 DIAGNOSIS — K602 Anal fissure, unspecified: Secondary | ICD-10-CM | POA: Diagnosis not present

## 2017-05-04 DIAGNOSIS — M25511 Pain in right shoulder: Secondary | ICD-10-CM | POA: Diagnosis not present

## 2017-05-04 DIAGNOSIS — M7501 Adhesive capsulitis of right shoulder: Secondary | ICD-10-CM | POA: Diagnosis not present

## 2017-05-12 ENCOUNTER — Ambulatory Visit (INDEPENDENT_AMBULATORY_CARE_PROVIDER_SITE_OTHER): Payer: Medicare Other | Admitting: *Deleted

## 2017-05-12 DIAGNOSIS — I255 Ischemic cardiomyopathy: Secondary | ICD-10-CM | POA: Diagnosis not present

## 2017-05-13 LAB — CUP PACEART REMOTE DEVICE CHECK
Brady Statistic AP VS Percent: 0.01 %
Brady Statistic AS VP Percent: 98.55 %
Brady Statistic AS VS Percent: 1.4 %
Brady Statistic RV Percent Paced: 0.59 %
Date Time Interrogation Session: 20180705083724
HIGH POWER IMPEDANCE MEASURED VALUE: 38 Ohm
HIGH POWER IMPEDANCE MEASURED VALUE: 46 Ohm
Implantable Lead Implant Date: 20090610
Implantable Lead Location: 753859
Implantable Lead Model: 6947
Implantable Pulse Generator Implant Date: 20160323
Lead Channel Impedance Value: 285 Ohm
Lead Channel Impedance Value: 342 Ohm
Lead Channel Impedance Value: 418 Ohm
Lead Channel Impedance Value: 456 Ohm
Lead Channel Pacing Threshold Amplitude: 1.375 V
Lead Channel Pacing Threshold Pulse Width: 0.4 ms
Lead Channel Pacing Threshold Pulse Width: 0.4 ms
Lead Channel Sensing Intrinsic Amplitude: 4.125 mV
Lead Channel Sensing Intrinsic Amplitude: 4.125 mV
Lead Channel Setting Pacing Amplitude: 1.5 V
Lead Channel Setting Pacing Amplitude: 2 V
Lead Channel Setting Pacing Amplitude: 2.5 V
Lead Channel Setting Pacing Pulse Width: 0.4 ms
Lead Channel Setting Pacing Pulse Width: 0.4 ms
MDC IDC LEAD IMPLANT DT: 20090610
MDC IDC LEAD IMPLANT DT: 20090610
MDC IDC LEAD LOCATION: 753858
MDC IDC LEAD LOCATION: 753860
MDC IDC MSMT BATTERY REMAINING LONGEVITY: 90 mo
MDC IDC MSMT BATTERY VOLTAGE: 2.99 V
MDC IDC MSMT LEADCHNL LV IMPEDANCE VALUE: 608 Ohm
MDC IDC MSMT LEADCHNL RA IMPEDANCE VALUE: 456 Ohm
MDC IDC MSMT LEADCHNL RA PACING THRESHOLD AMPLITUDE: 0.75 V
MDC IDC MSMT LEADCHNL RA SENSING INTR AMPL: 2.375 mV
MDC IDC MSMT LEADCHNL RA SENSING INTR AMPL: 2.375 mV
MDC IDC MSMT LEADCHNL RV PACING THRESHOLD AMPLITUDE: 0.875 V
MDC IDC MSMT LEADCHNL RV PACING THRESHOLD PULSEWIDTH: 0.4 ms
MDC IDC SET LEADCHNL RV SENSING SENSITIVITY: 0.3 mV
MDC IDC STAT BRADY AP VP PERCENT: 0.03 %
MDC IDC STAT BRADY RA PERCENT PACED: 0.05 %

## 2017-05-13 NOTE — Progress Notes (Signed)
Remote ICD transmission.   

## 2017-05-20 ENCOUNTER — Ambulatory Visit (HOSPITAL_COMMUNITY)
Admission: RE | Admit: 2017-05-20 | Discharge: 2017-05-20 | Disposition: A | Discharge status: Home / Self Care | Payer: Medicare Other | Source: Ambulatory Visit | Attending: Gastroenterology | Admitting: Gastroenterology

## 2017-05-20 ENCOUNTER — Encounter: Payer: Self-pay | Admitting: Cardiology

## 2017-05-20 DIAGNOSIS — R1011 Right upper quadrant pain: Secondary | ICD-10-CM | POA: Insufficient documentation

## 2017-05-20 DIAGNOSIS — R110 Nausea: Secondary | ICD-10-CM

## 2017-05-20 MED ORDER — TECHNETIUM TC 99M MEBROFENIN IV KIT
5.0000 | PACK | Freq: Once | INTRAVENOUS | Status: AC | PRN
  Administered 2017-05-20: 5 via INTRAVENOUS

## 2017-05-23 ENCOUNTER — Other Ambulatory Visit: Payer: Self-pay | Admitting: Family Medicine

## 2017-05-23 DIAGNOSIS — Z1231 Encounter for screening mammogram for malignant neoplasm of breast: Secondary | ICD-10-CM

## 2017-06-03 ENCOUNTER — Encounter: Payer: Self-pay | Admitting: Cardiology

## 2017-06-04 ENCOUNTER — Other Ambulatory Visit: Payer: Self-pay | Admitting: Interventional Cardiology

## 2017-06-09 ENCOUNTER — Encounter: Payer: Medicare Other | Admitting: Gynecology

## 2017-06-09 ENCOUNTER — Other Ambulatory Visit: Payer: Self-pay | Admitting: Interventional Cardiology

## 2017-06-09 MED ORDER — ISOSORBIDE MONONITRATE ER 60 MG PO TB24: 60.0000 mg | ORAL_TABLET | Freq: Every day | ORAL | 3 refills | Status: DC

## 2017-06-14 ENCOUNTER — Ambulatory Visit: Payer: Medicare Other

## 2017-06-16 DIAGNOSIS — F332 Major depressive disorder, recurrent severe without psychotic features: Secondary | ICD-10-CM | POA: Diagnosis not present

## 2017-06-16 DIAGNOSIS — F411 Generalized anxiety disorder: Secondary | ICD-10-CM | POA: Diagnosis not present

## 2017-07-02 ENCOUNTER — Other Ambulatory Visit: Payer: Self-pay | Admitting: Interventional Cardiology

## 2017-07-05 ENCOUNTER — Other Ambulatory Visit: Payer: Self-pay | Admitting: Surgery

## 2017-07-05 DIAGNOSIS — K8050 Calculus of bile duct without cholangitis or cholecystitis without obstruction: Secondary | ICD-10-CM | POA: Diagnosis not present

## 2017-07-08 DIAGNOSIS — Z1231 Encounter for screening mammogram for malignant neoplasm of breast: Secondary | ICD-10-CM | POA: Diagnosis not present

## 2017-07-12 ENCOUNTER — Telehealth: Payer: Self-pay | Admitting: Interventional Cardiology

## 2017-07-12 NOTE — Telephone Encounter (Signed)
Will be at increased risk for CHF and arrhythmia. Okay to hold Plavix for 5 days before surgery.

## 2017-07-12 NOTE — Telephone Encounter (Signed)
Request for surgical clearance:  1. What type of surgery is being performed? Cholecystectomy under general anesthesia   2. When is this surgery scheduled? Pending   3. Are there any medications that need to be held prior to surgery and how long? Plavix and ASA   4. Name of physician performing surgery? Dr. Rueben Bash   5. What is your office phone and fax number? Phone [phone] Fax [phone] Sabas Sous, CMA  Cardiac clearance needed and instructions on holding ASA and Plavix

## 2017-07-13 NOTE — Telephone Encounter (Signed)
Faxed to requesting office. 

## 2017-07-18 ENCOUNTER — Other Ambulatory Visit: Payer: Self-pay | Admitting: Surgery

## 2017-07-25 DIAGNOSIS — N189 Chronic kidney disease, unspecified: Secondary | ICD-10-CM | POA: Diagnosis not present

## 2017-07-25 DIAGNOSIS — N2581 Secondary hyperparathyroidism of renal origin: Secondary | ICD-10-CM | POA: Diagnosis not present

## 2017-07-25 DIAGNOSIS — N183 Chronic kidney disease, stage 3 (moderate): Secondary | ICD-10-CM | POA: Diagnosis not present

## 2017-07-27 DIAGNOSIS — N183 Chronic kidney disease, stage 3 (moderate): Secondary | ICD-10-CM | POA: Diagnosis not present

## 2017-07-27 DIAGNOSIS — Z23 Encounter for immunization: Secondary | ICD-10-CM | POA: Diagnosis not present

## 2017-07-27 DIAGNOSIS — D631 Anemia in chronic kidney disease: Secondary | ICD-10-CM | POA: Diagnosis not present

## 2017-07-27 DIAGNOSIS — I129 Hypertensive chronic kidney disease with stage 1 through stage 4 chronic kidney disease, or unspecified chronic kidney disease: Secondary | ICD-10-CM | POA: Diagnosis not present

## 2017-07-27 DIAGNOSIS — N2581 Secondary hyperparathyroidism of renal origin: Secondary | ICD-10-CM | POA: Diagnosis not present

## 2017-08-02 DIAGNOSIS — M25561 Pain in right knee: Secondary | ICD-10-CM | POA: Diagnosis not present

## 2017-08-11 ENCOUNTER — Ambulatory Visit (INDEPENDENT_AMBULATORY_CARE_PROVIDER_SITE_OTHER): Payer: Medicare Other | Admitting: *Deleted

## 2017-08-11 DIAGNOSIS — I255 Ischemic cardiomyopathy: Secondary | ICD-10-CM

## 2017-08-11 NOTE — Progress Notes (Signed)
Remote ICD transmission.   

## 2017-08-12 LAB — CUP PACEART REMOTE DEVICE CHECK
Battery Remaining Longevity: 89 mo
Brady Statistic AP VP Percent: 0.04 %
Brady Statistic AS VP Percent: 98.52 %
Brady Statistic AS VS Percent: 1.43 %
Date Time Interrogation Session: 20181004041703
HIGH POWER IMPEDANCE MEASURED VALUE: 38 Ohm
HIGH POWER IMPEDANCE MEASURED VALUE: 49 Ohm
Implantable Lead Implant Date: 20090610
Implantable Lead Location: 753859
Implantable Lead Model: 5076
Implantable Lead Model: 6947
Lead Channel Impedance Value: 285 Ohm
Lead Channel Impedance Value: 342 Ohm
Lead Channel Impedance Value: 513 Ohm
Lead Channel Impedance Value: 646 Ohm
Lead Channel Pacing Threshold Amplitude: 0.875 V
Lead Channel Pacing Threshold Amplitude: 0.875 V
Lead Channel Pacing Threshold Pulse Width: 0.4 ms
Lead Channel Sensing Intrinsic Amplitude: 3.75 mV
Lead Channel Setting Pacing Amplitude: 2 V
Lead Channel Setting Pacing Amplitude: 2 V
MDC IDC LEAD IMPLANT DT: 20090610
MDC IDC LEAD IMPLANT DT: 20090610
MDC IDC LEAD LOCATION: 753858
MDC IDC LEAD LOCATION: 753860
MDC IDC MSMT BATTERY VOLTAGE: 2.99 V
MDC IDC MSMT LEADCHNL LV PACING THRESHOLD PULSEWIDTH: 0.4 ms
MDC IDC MSMT LEADCHNL RA IMPEDANCE VALUE: 456 Ohm
MDC IDC MSMT LEADCHNL RA PACING THRESHOLD AMPLITUDE: 0.75 V
MDC IDC MSMT LEADCHNL RA SENSING INTR AMPL: 2.375 mV
MDC IDC MSMT LEADCHNL RA SENSING INTR AMPL: 2.375 mV
MDC IDC MSMT LEADCHNL RV IMPEDANCE VALUE: 418 Ohm
MDC IDC MSMT LEADCHNL RV PACING THRESHOLD PULSEWIDTH: 0.4 ms
MDC IDC MSMT LEADCHNL RV SENSING INTR AMPL: 3.75 mV
MDC IDC PG IMPLANT DT: 20160323
MDC IDC SET LEADCHNL LV PACING PULSEWIDTH: 0.4 ms
MDC IDC SET LEADCHNL RA PACING AMPLITUDE: 1.5 V
MDC IDC SET LEADCHNL RV PACING PULSEWIDTH: 0.4 ms
MDC IDC SET LEADCHNL RV SENSING SENSITIVITY: 0.3 mV
MDC IDC STAT BRADY AP VS PERCENT: 0.01 %
MDC IDC STAT BRADY RA PERCENT PACED: 0.05 %
MDC IDC STAT BRADY RV PERCENT PACED: 0.47 %

## 2017-08-18 ENCOUNTER — Encounter: Payer: Self-pay | Admitting: Cardiology

## 2017-08-22 NOTE — Telephone Encounter (Signed)
Close Encounter 

## 2017-08-30 ENCOUNTER — Other Ambulatory Visit: Payer: Self-pay | Admitting: Interventional Cardiology

## 2017-08-30 NOTE — Telephone Encounter (Signed)
Disp Refills Start End   metoprolol (LOPRESSOR) 100 MG tablet 180 tablet 2 12/31/2016    Sig: TAKE 1 TABLET BY MOUTH TWO TIMES DAILY   Sent to pharmacy as: metoprolol (LOPRESSOR) 100 MG tablet   E-Prescribing Status: Receipt confirmed by pharmacy (12/31/2016 4:09 PM EST)

## 2017-09-02 ENCOUNTER — Encounter: Payer: Self-pay | Admitting: Cardiology

## 2017-09-15 DIAGNOSIS — F411 Generalized anxiety disorder: Secondary | ICD-10-CM | POA: Diagnosis not present

## 2017-09-15 DIAGNOSIS — F332 Major depressive disorder, recurrent severe without psychotic features: Secondary | ICD-10-CM | POA: Diagnosis not present

## 2017-09-26 DIAGNOSIS — I10 Essential (primary) hypertension: Secondary | ICD-10-CM | POA: Diagnosis not present

## 2017-09-26 DIAGNOSIS — E7800 Pure hypercholesterolemia, unspecified: Secondary | ICD-10-CM | POA: Diagnosis not present

## 2017-09-26 DIAGNOSIS — E1151 Type 2 diabetes mellitus with diabetic peripheral angiopathy without gangrene: Secondary | ICD-10-CM | POA: Diagnosis not present

## 2017-09-26 DIAGNOSIS — N183 Chronic kidney disease, stage 3 (moderate): Secondary | ICD-10-CM | POA: Diagnosis not present

## 2017-09-26 DIAGNOSIS — E1121 Type 2 diabetes mellitus with diabetic nephropathy: Secondary | ICD-10-CM | POA: Diagnosis not present

## 2017-09-26 DIAGNOSIS — I2510 Atherosclerotic heart disease of native coronary artery without angina pectoris: Secondary | ICD-10-CM | POA: Diagnosis not present

## 2017-09-26 DIAGNOSIS — Z95810 Presence of automatic (implantable) cardiac defibrillator: Secondary | ICD-10-CM | POA: Diagnosis not present

## 2017-09-28 ENCOUNTER — Ambulatory Visit (INDEPENDENT_AMBULATORY_CARE_PROVIDER_SITE_OTHER): Payer: Medicare Other | Admitting: Internal Medicine

## 2017-09-28 ENCOUNTER — Encounter: Payer: Self-pay | Admitting: Internal Medicine

## 2017-09-28 VITALS — BP 124/82 | HR 74 | Ht 64.5 in | Wt 167.0 lb

## 2017-09-28 DIAGNOSIS — I2589 Other forms of chronic ischemic heart disease: Secondary | ICD-10-CM

## 2017-09-28 DIAGNOSIS — I5022 Chronic systolic (congestive) heart failure: Secondary | ICD-10-CM | POA: Diagnosis not present

## 2017-09-28 DIAGNOSIS — I255 Ischemic cardiomyopathy: Secondary | ICD-10-CM | POA: Diagnosis not present

## 2017-09-28 DIAGNOSIS — Z95810 Presence of automatic (implantable) cardiac defibrillator: Secondary | ICD-10-CM | POA: Diagnosis not present

## 2017-09-28 LAB — CUP PACEART INCLINIC DEVICE CHECK
Battery Remaining Longevity: 86 mo
Brady Statistic AS VS Percent: 1.41 %
Date Time Interrogation Session: 20181121162951
HIGH POWER IMPEDANCE MEASURED VALUE: 39 Ohm
HighPow Impedance: 49 Ohm
Implantable Lead Implant Date: 20090610
Implantable Lead Location: 753858
Implantable Pulse Generator Implant Date: 20160323
Lead Channel Impedance Value: 361 Ohm
Lead Channel Pacing Threshold Amplitude: 0.75 V
Lead Channel Pacing Threshold Amplitude: 0.75 V
Lead Channel Pacing Threshold Pulse Width: 0.4 ms
Lead Channel Sensing Intrinsic Amplitude: 3.375 mV
Lead Channel Setting Pacing Amplitude: 2 V
Lead Channel Setting Pacing Amplitude: 2 V
Lead Channel Setting Pacing Pulse Width: 0.4 ms
Lead Channel Setting Pacing Pulse Width: 0.4 ms
MDC IDC LEAD IMPLANT DT: 20090610
MDC IDC LEAD IMPLANT DT: 20090610
MDC IDC LEAD LOCATION: 753859
MDC IDC LEAD LOCATION: 753860
MDC IDC MSMT BATTERY VOLTAGE: 2.98 V
MDC IDC MSMT LEADCHNL LV IMPEDANCE VALUE: 285 Ohm
MDC IDC MSMT LEADCHNL LV IMPEDANCE VALUE: 532 Ohm
MDC IDC MSMT LEADCHNL LV IMPEDANCE VALUE: 703 Ohm
MDC IDC MSMT LEADCHNL RA IMPEDANCE VALUE: 475 Ohm
MDC IDC MSMT LEADCHNL RA PACING THRESHOLD AMPLITUDE: 0.5 V
MDC IDC MSMT LEADCHNL RA PACING THRESHOLD PULSEWIDTH: 0.4 ms
MDC IDC MSMT LEADCHNL RA SENSING INTR AMPL: 2.25 mV
MDC IDC MSMT LEADCHNL RA SENSING INTR AMPL: 3.75 mV
MDC IDC MSMT LEADCHNL RV IMPEDANCE VALUE: 456 Ohm
MDC IDC MSMT LEADCHNL RV PACING THRESHOLD PULSEWIDTH: 0.4 ms
MDC IDC MSMT LEADCHNL RV SENSING INTR AMPL: 3 mV
MDC IDC SET LEADCHNL RA PACING AMPLITUDE: 1.5 V
MDC IDC SET LEADCHNL RV SENSING SENSITIVITY: 0.3 mV
MDC IDC STAT BRADY AP VP PERCENT: 0.03 %
MDC IDC STAT BRADY AP VS PERCENT: 0.01 %
MDC IDC STAT BRADY AS VP PERCENT: 98.54 %
MDC IDC STAT BRADY RA PERCENT PACED: 0.04 %
MDC IDC STAT BRADY RV PERCENT PACED: 0.49 %

## 2017-09-28 MED ORDER — CARVEDILOL 25 MG PO TABS: ORAL_TABLET | ORAL | 3 refills | Status: DC

## 2017-09-28 NOTE — Progress Notes (Addendum)
HPI [name] returns today for ongoing evaluation and management of an ischemic cardiomyopathy, status post MI, status post bypass surgery, status post ICD implantation. In the interim she has been stable though she admits to being under increased marital stress. She denies chest pain. She has class II heart failure symptoms. No ICD therapies. Allergies  Allergen Reactions  . Calcium Channel Blockers Other (See Comments)    Cannot take non-DHP CCBs (diltiazem, verapamil) due to CHF requiring ICD  . Codeine Anaphylaxis  . Lyrica [Pregabalin]     Nightmares, and swelling  . Other Anaphylaxis    Walnuts, pecans, and ANY melons PATIENT IS A VEGAN  . Ticlid [Ticlopidine Hcl] Other (See Comments)    Unprovoked bleeding while on Ticlid (doesn't think she was on ASA at the time) Takes Plavix without problems  . Morphine And Related Other (See Comments)    Severe AMS, confusion, weakness Tolerates Fentanyl, Tramadol  . Tape Dermatitis and Rash    Tolerates paper tape  . Digoxin And Related Nausea Only    Unknown  . Metolazone Other (See Comments)    Cannot remember reaction  . Myrbetriq [Mirabegron] Other (See Comments)    Aggravates migraines  . Onglyza [Saxagliptin] Other (See Comments)    Exacerbates migraines  . Penicillins Hives    Has patient had a PCN reaction causing immediate rash, facial/tongue/throat swelling, SOB or lightheadedness with hypotension: Yes Has patient had a PCN reaction causing severe rash involving mucus membranes or skin necrosis: Unknown Has patient had a PCN reaction that required hospitalization: Unknown Has patient had a PCN reaction occurring within the last 10 years: No If all of the above answers are "NO", then may proceed with Cephalosporin use.   [name] Spironolactone Other (See Comments)    Cannot remember reaction  . Sulfa Antibiotics Hives and Other (See Comments)  . Tetracyclines & Related Other (See Comments)    Cannot remember  reaction Tolerates macrolides (azithromycin)  . Vicodin [Hydrocodone-Acetaminophen] Other (See Comments)    EXTREME LETHARGY  . Latex Itching and Rash     Current Outpatient Medications  Medication Sig Dispense Refill  . albuterol (PROVENTIL HFA;VENTOLIN HFA) 108 (90 BASE) MCG/ACT inhaler Inhale 2 puffs into the lungs every 4 (four) hours as needed for wheezing or shortness of breath.    . ALPRAZolam (XANAX) 0.5 MG tablet Take 0.5-1 tablets by mouth 3 (three) times daily. 1 mg in the morning then 0.5 mg at noon then 0.5 mg at bedtime    . Artificial Tear GEL Place 2 drops into both eyes daily as needed (dry eyes).     [name] aspirin 325 MG tablet Take 325 mg by mouth daily.    . cetirizine (ZYRTEC) 10 MG tablet Take 10 mg by mouth daily.    . Cholecalciferol (VITAMIN D-3) 1000 UNITS CAPS Take 1 capsule by mouth daily.     . clopidogrel (PLAVIX) 75 MG tablet TAKE 1 TABLET BY MOUTH  DAILY 90 tablet 2  . cyclobenzaprine (FLEXERIL) 5 MG tablet Take 1 tablet (5 mg total) by mouth at bedtime as needed for muscle spasms. 30 tablet 0  . doxazosin (CARDURA) 4 MG tablet Take 4 mg by mouth at bedtime.     [name] ezetimibe (ZETIA) 10 MG tablet TAKE 1 TABLET BY MOUTH  DAILY 90 tablet 2  . fluticasone (FLONASE) 50 MCG/ACT nasal spray Place 2 sprays into both nostrils daily as needed for allergies or rhinitis.     [name]  furosemide (LASIX) 80 MG tablet TAKE 1 TABLET BY MOUTH 2  TIMES DAILY. MAY TAKE AN  ADDITIONAL 80MG  (1 TABLET)  AS NEEDED FOR EDEMA. 180 tablet 2  . Insulin Glargine (BASAGLAR KWIKPEN) 100 UNIT/ML SOPN Inject 20 Units into the skin at bedtime. Reported on 02/18/2016     . Multiple Vitamin (MULTIVITAMIN WITH MINERALS) TABS Take 1 tablet by mouth daily.    [name] NITROSTAT 0.4 MG SL tablet Place 1 tablet under the tongue every 5 minutes as needed for chest pain, max 3 doses, go to er if no relief 25 tablet 2  . ondansetron (ZOFRAN) 8 MG tablet Take 8 mg by mouth every 8 (eight) hours as needed for nausea or  vomiting.     . pantoprazole (PROTONIX) 40 MG tablet Take 40 mg by mouth daily.    . potassium chloride (K-DUR,KLOR-CON) 10 MEQ tablet TAKE 2 TABLETS BY MOUTH TWO TIMES DAILY 360 tablet 2  . ramipril (ALTACE) 10 MG capsule Take 1 capsule (10 mg total) by mouth 2 (two) times daily. 180 capsule 3  . Riboflavin 100 MG TABS Take 2 tablets (200 mg total) by mouth daily. 60 tablet 1  . simvastatin (ZOCOR) 20 MG tablet TAKE 1 TABLET BY MOUTH AT  BEDTIME 90 tablet 2  . sodium chloride (MURO 128) 5 % ophthalmic ointment Place 1 drop into both eyes at bedtime. For dry eyes    . traMADol (ULTRAM) 50 MG tablet Take 100 mg by mouth every 6 (six) hours as needed (MIGRAINES).    [name] venlafaxine (EFFEXOR) 100 MG tablet Take 100-200 mg by mouth See admin instructions. 200 mg in the morning then 100 mg at noon    . carvedilol (COREG) 25 MG tablet Take 1/2 tablet twice a day by mouth for 2 weeks.  Then increase to one tab by mouth twice a day. 180 tablet 3  . isosorbide mononitrate (IMDUR) 60 MG 24 hr tablet Take 1 tablet (60 mg total) by mouth daily. 90 tablet 3   No current facility-administered medications for this visit.      Past Medical History:  Diagnosis Date  . AICD (automatic cardioverter/defibrillator) present    Medtronic- Dr. Beckie Salts follows  . Anginal pain ([place])   . Anxiety   . Asthma   . Back pain    "pinched nerve-lower back" - Dr. Nelva Bush follows.  . Cervical dysplasia   . CHF (congestive heart failure) ([place])   . Chronic kidney disease    Dr. Posey Pronto follows.  . Complication of anesthesia    "I wake up during surgeries" (02/14/2013)  . Coronary artery disease   . Depression   . Fibroid   . Function kidney decreased   . GERD (gastroesophageal reflux disease)   . Hepatitis    Hepatitis A -college yrs"water source exposure"  . Hiatal hernia   . History of shingles    2-3 yrs ago last out break "around waist"  . History of stomach ulcers   . Hypertension   . ICD (implantable cardiac  defibrillator) in place   . Iron deficiency anemia   . Ischemic cardiomyopathy    status post biventricular ICD placed by DR Edumunds who used to see Dr Melvern Banker here to establish  cardiovascular care.  . Migraines   . MVP (mitral valve prolapse)    Antibiotics not required for procedures  . Myocardial infarction ([place])    "I've had 2; the others they were able to catch before completing" (02/14/2013)  .  Pacemaker   . Pneumonia 1950's & 1985  . Shortness of breath    "lying down flat; at times w/exertion" (02/14/2013). 10-06-15 exertion only..  . Stroke [facility])    "2 confirmed; 9 TIA's; results in dragging LLE; numbness in tip of tongue" (02/14/2013),10-06-15 right hand tends to be weaker when tired.  . Type II diabetes mellitus ([place])     ROS:   All systems reviewed and negative except as noted in the HPI.   Past Surgical History:  Procedure Laterality Date  . ABDOMINAL HYSTERECTOMY  1985   TAH   . BIV ICD GENERTAOR CHANGE OUT  06/2007; 04/2008   "2 lead initial placement, at [facility]; done at [facility], after developing CHF" (02/14/2013)  . BIV PACEMAKER GENERATOR CHANGE OUT N/A 01/29/2015   Procedure: BIV PACEMAKER GENERATOR CHANGE OUT;  Surgeon: Evans Lance, MD;  Location: [facility] CATH LAB;  Service: Cardiovascular;  Laterality: N/A;  . BREAST EXCISIONAL BIOPSY Left 01/2007; 06/2007; 03/2008   "benign" (02/14/2013)  . BREAST SURGERY    . CARDIAC CATHETERIZATION     "probably in the teens" (02/14/2013)  . CARDIAC DEFIBRILLATOR PLACEMENT    . COLONOSCOPY  ~ 2002  . COLONOSCOPY WITH PROPOFOL N/A 10/07/2015   Procedure: COLONOSCOPY WITH PROPOFOL;  Surgeon: Juanita Craver, MD;  Location: [facility] ENDOSCOPY;  Service: Endoscopy;  Laterality: N/A;  . CORONARY ANGIOPLASTY WITH STENT PLACEMENT     "started out w/5; bypass corrected some; 1 stent since the bypass" (02/14/2013)  . CORONARY ARTERY BYPASS GRAFT  ` 1998   LIMA-LAD, SVG-D1, SVG-PDA  . Avery OF UTERUS  1975 X 2; 1976; 1977  . INSERT / REPLACE /  REMOVE PACEMAKER     biventricular defibrillator--06/10/ 2009  . LEFT HEART CATHETERIZATION WITH CORONARY/GRAFT ANGIOGRAM N/A 01/03/2015   Procedure: LEFT HEART CATHETERIZATION WITH Beatrix Fetters;  Surgeon: Sinclair Grooms, MD;  Location: [facility] CATH LAB;  Service: Cardiovascular;  Laterality: N/A;  . SUPRAVENTRICULAR TACHYCARDIA ABLATION  06/2007  . TEE WITHOUT CARDIOVERSION  11/14/2012   Procedure: TRANSESOPHAGEAL ECHOCARDIOGRAM (TEE);  Surgeon: Candee Furbish, MD;  Location: [facility] ENDOSCOPY;  Service: Cardiovascular;  Laterality: N/A;     Family History  Problem Relation Age of Onset  . Heart disease Mother   . Hypertension Mother   . Heart attack Mother   . Stroke Mother   . Heart disease Father   . Hypertension Father   . Diabetes Father   . Kidney failure Father   . Heart attack Father   . Stroke Father   . Heart disease Brother   . Diabetes Brother   . Kidney failure Brother   . Heart attack Brother   . Diabetes Paternal Grandmother      Social History   Socioeconomic History  . Marital status: Married    Spouse name: [name]  . Number of children: 1  . Years of education: MA  . Highest education level: Not on file  Social Needs  . Financial resource strain: Not on file  . Food insecurity - worry: Not on file  . Food insecurity - inability: Not on file  . Transportation needs - medical: Not on file  . Transportation needs - non-medical: Not on file  Occupational History    Employer: UNEMPLOYED    Comment: Disablity  Tobacco Use  . Smoking status: Never Smoker  . Smokeless tobacco: Never Used  Substance and Sexual Activity  . Alcohol use: No    Alcohol/week: 0.0 oz  . Drug  use: No  . Sexual activity: Yes    Birth control/protection: Surgical  Other Topics Concern  . Not on file  Social History Narrative   Patient lives at home with spouse.     Daughter name is Tourist information centre manager.   Caffeine Use: none     BP 124/82   Pulse 74   Ht 5' 4.5" (1.638 m)   Wt 167  lb (75.8 kg)   BMI 28.22 kg/m   Physical Exam:  Well appearing NAD HEENT: Unremarkable Neck:  No JVD, no thyromegally Lymphatics:  No adenopathy Back:  No CVA tenderness Lungs:  Clear HEART:  Regular rate rhythm, no murmurs, no rubs, no clicks Abd:  soft, positive bowel sounds, no organomegally, no rebound, no guarding Ext:  2 plus pulses, no edema, no cyanosis, no clubbing Skin:  No rashes no nodules Neuro:  CN II through XII intact, motor grossly intact  EKG - normal sinus rhythm with right bundle branch block with fusion  DEVICE  Normal device function.  See PaceArt for details.   Assess/Plan: 1. Chronic systolic heart failure - her symptoms are class II. She is encouraged to increase her physical activity and maintain a low-sodium diet. She is able to walk on flat ground with no difficulty. 2. Ischemic cardiomyopathy - she denies anginal symptoms. She is encouraged to increase her physical activity. 3. ICD - her Medtronic biventricular ICD is working normally and she has approximate 7 years of battery longevity. 4. Anxiety - the patient is tearful today and notes that she has had some marital problems secondary to her husbands poor health. I encouraged the patient to increase her physical activity and all, regular basis. She is encouraged to get professional help if necessary for her condition.  5. Preoperative evaluation - the patient is pending gallbladder surgery. She is low risk for major cardiovascular complications. No additional testing is recommended this time.  Cristopher Peru, M.D.

## 2017-09-28 NOTE — Patient Instructions (Addendum)
Medication Instructions:  Your physician has recommended you make the following change in your medication:  1.  Stop metoprolol. 2.  Start taking carvedilol 25 mg tablets.  Take 1/2 tablet by mouth twice a day for 2 weeks.  Then take 1 tablet by mouth twice a day thereafter.  Labwork: None ordered.  Testing/Procedures: None ordered.  Follow-Up: Your physician wants you to follow-up in: one year with Dr. Lovena Le.   You will receive a reminder letter in the mail two months in advance. If you don't receive a letter, please call our office to schedule the follow-up appointment.  Remote monitoring is used to monitor your ICD from home. This monitoring reduces the number of office visits required to check your device to one time per year. It allows [place] to keep an eye on the functioning of your device to ensure it is working properly. You are scheduled for a device check from home on 11/10/2017. You may send your transmission at any time that day. If you have a wireless device, the transmission will be sent automatically. After your physician reviews your transmission, you will receive a postcard with your next transmission date.    Any Other Special Instructions Will Be Listed Below (If Applicable).     If you need a refill on your cardiac medications before your next appointment, please call your pharmacy.

## 2017-10-10 DIAGNOSIS — H25813 Combined forms of age-related cataract, bilateral: Secondary | ICD-10-CM | POA: Diagnosis not present

## 2017-10-10 DIAGNOSIS — H04123 Dry eye syndrome of bilateral lacrimal glands: Secondary | ICD-10-CM | POA: Diagnosis not present

## 2017-10-10 DIAGNOSIS — E119 Type 2 diabetes mellitus without complications: Secondary | ICD-10-CM | POA: Diagnosis not present

## 2017-10-21 ENCOUNTER — Other Ambulatory Visit: Payer: Self-pay

## 2017-10-21 MED ORDER — CARVEDILOL 25 MG PO TABS: 25.0000 mg | ORAL_TABLET | Freq: Two times a day (BID) | ORAL | 3 refills | Status: DC

## 2017-10-24 ENCOUNTER — Other Ambulatory Visit: Payer: Self-pay

## 2017-10-24 MED ORDER — ISOSORBIDE MONONITRATE ER 60 MG PO TB24: 60.0000 mg | ORAL_TABLET | Freq: Every day | ORAL | 3 refills | Status: DC

## 2017-11-10 ENCOUNTER — Ambulatory Visit (INDEPENDENT_AMBULATORY_CARE_PROVIDER_SITE_OTHER): Payer: Medicare HMO | Admitting: *Deleted

## 2017-11-10 DIAGNOSIS — I255 Ischemic cardiomyopathy: Secondary | ICD-10-CM

## 2017-11-11 NOTE — Progress Notes (Signed)
Remote ICD transmission.   

## 2017-11-14 ENCOUNTER — Encounter: Payer: Self-pay | Admitting: Cardiology

## 2017-11-18 ENCOUNTER — Ambulatory Visit (INDEPENDENT_AMBULATORY_CARE_PROVIDER_SITE_OTHER): Payer: Medicare HMO | Admitting: Interventional Cardiology

## 2017-11-18 ENCOUNTER — Other Ambulatory Visit: Payer: Self-pay | Admitting: Interventional Cardiology

## 2017-11-18 ENCOUNTER — Encounter: Payer: Self-pay | Admitting: Interventional Cardiology

## 2017-11-18 VITALS — BP 116/84 | HR 77 | Ht 64.5 in | Wt 170.8 lb

## 2017-11-18 DIAGNOSIS — I10 Essential (primary) hypertension: Secondary | ICD-10-CM

## 2017-11-18 DIAGNOSIS — I209 Angina pectoris, unspecified: Secondary | ICD-10-CM

## 2017-11-18 DIAGNOSIS — I255 Ischemic cardiomyopathy: Secondary | ICD-10-CM

## 2017-11-18 DIAGNOSIS — I5032 Chronic diastolic (congestive) heart failure: Secondary | ICD-10-CM

## 2017-11-18 DIAGNOSIS — I25709 Atherosclerosis of coronary artery bypass graft(s), unspecified, with unspecified angina pectoris: Secondary | ICD-10-CM | POA: Diagnosis not present

## 2017-11-18 DIAGNOSIS — N183 Chronic kidney disease, stage 3 (moderate): Secondary | ICD-10-CM | POA: Diagnosis not present

## 2017-11-18 MED ORDER — METOPROLOL SUCCINATE ER 100 MG PO TB24: ORAL_TABLET | ORAL | 1 refills | Status: DC

## 2017-11-18 NOTE — Progress Notes (Signed)
Cardiology Office Note    Date:  11/18/2017   ID:  [name], [name] 1951-07-25, MRN [id number]  PCP:  [name], MD  Cardiologist: Sinclair Grooms, MD   Chief Complaint  Patient presents with  . Coronary Artery Disease  . Congestive Heart Failure    History of Present Illness:  [name] is a 67 y.o. female  presents for CAD, CHF, Defibrillator, ischemic cardiomyopathy, mitral valve disease with moderate regurgitation, diabetes mellitus, and ischemic stroke history   She has felt lethargic since starting carvedilol after the EP visit with Dr. Lovena Le.  She has not had significant angina.  Breathing is been stable.  She denies orthopnea.  Since the medication change she states that she sleeps all the time.  She was previously on metoprolol tartrate 100 mg twice daily.   Past Medical History:  Diagnosis Date  . AICD (automatic cardioverter/defibrillator) present    [employer]- Dr. Beckie Salts follows  . Anginal pain ([place])   . Anxiety   . Asthma   . Back pain    "pinched nerve-lower back" - Dr. Nelva Bush follows.  . Cervical dysplasia   . CHF (congestive heart failure) ([place])   . Chronic kidney disease    Dr. Posey Pronto follows.  . Complication of anesthesia    "I wake up during surgeries" (02/14/2013)  . Coronary artery disease   . Depression   . Fibroid   . Function kidney decreased   . GERD (gastroesophageal reflux disease)   . Hepatitis    Hepatitis A -college yrs"water source exposure"  . Hiatal hernia   . History of shingles    2-3 yrs ago last out break "around waist"  . History of stomach ulcers   . Hypertension   . ICD (implantable cardiac defibrillator) in place   . Iron deficiency anemia   . Ischemic cardiomyopathy    status post biventricular ICD placed by DR Edumunds who used to see Dr Melvern Banker here to establish  cardiovascular care.  . Migraines   . MVP (mitral valve prolapse)    Antibiotics not required for procedures  . Myocardial  infarction ([place])    "I've had 2; the others they were able to catch before completing" (02/14/2013)  . Pacemaker   . Pneumonia 1950's & 1985  . Shortness of breath    "lying down flat; at times w/exertion" (02/14/2013). 10-06-15 exertion only..  . Stroke [facility])    "2 confirmed; 9 TIA's; results in dragging LLE; numbness in tip of tongue" (02/14/2013),10-06-15 right hand tends to be weaker when tired.  . Type II diabetes mellitus ([place])     Past Surgical History:  Procedure Laterality Date  . ABDOMINAL HYSTERECTOMY  1985   TAH   . BIV ICD GENERTAOR CHANGE OUT  06/2007; 04/2008   "2 lead initial placement, at [facility]; done at [facility], after developing CHF" (02/14/2013)  . BIV PACEMAKER GENERATOR CHANGE OUT N/A 01/29/2015   Procedure: BIV PACEMAKER GENERATOR CHANGE OUT;  Surgeon: Evans Lance, MD;  Location: [facility] CATH LAB;  Service: Cardiovascular;  Laterality: N/A;  . BREAST EXCISIONAL BIOPSY Left 01/2007; 06/2007; 03/2008   "benign" (02/14/2013)  . BREAST SURGERY    . CARDIAC CATHETERIZATION     "probably in the teens" (02/14/2013)  . CARDIAC DEFIBRILLATOR PLACEMENT    . COLONOSCOPY  ~ 2002  . COLONOSCOPY WITH PROPOFOL N/A 10/07/2015   Procedure: COLONOSCOPY WITH PROPOFOL;  Surgeon: Juanita Craver, MD;  Location: [facility] ENDOSCOPY;  Service: Endoscopy;  Laterality: N/A;  . CORONARY ANGIOPLASTY WITH STENT PLACEMENT     "started out w/5; bypass corrected some; 1 stent since the bypass" (02/14/2013)  . CORONARY ARTERY BYPASS GRAFT  ` 1998   LIMA-LAD, SVG-D1, SVG-PDA  . [place] OF UTERUS  1975 X 2; 1976; 1977  . INSERT / REPLACE / REMOVE PACEMAKER     biventricular defibrillator--06/10/ 2009  . LEFT HEART CATHETERIZATION WITH CORONARY/GRAFT ANGIOGRAM N/A 01/03/2015   Procedure: LEFT HEART CATHETERIZATION WITH Beatrix Fetters;  Surgeon: Sinclair Grooms, MD;  Location: [facility] CATH LAB;  Service: Cardiovascular;  Laterality: N/A;  . SUPRAVENTRICULAR TACHYCARDIA ABLATION  06/2007  . TEE WITHOUT  CARDIOVERSION  11/14/2012   Procedure: TRANSESOPHAGEAL ECHOCARDIOGRAM (TEE);  Surgeon: Candee Furbish, MD;  Location: [facility] ENDOSCOPY;  Service: Cardiovascular;  Laterality: N/A;    Current Medications: Outpatient Medications Prior to Visit  Medication Sig Dispense Refill  . albuterol (PROVENTIL HFA;VENTOLIN HFA) 108 (90 BASE) MCG/ACT inhaler Inhale 2 puffs into the lungs every 4 (four) hours as needed for wheezing or shortness of breath.    . ALPRAZolam (XANAX) 0.5 MG tablet Take 0.5-1 tablets by mouth 3 (three) times daily. 1 mg in the morning then 0.5 mg at noon then 0.5 mg at bedtime    . Artificial Tear GEL Place 2 drops into both eyes daily as needed (dry eyes).     [name] aspirin 325 MG tablet Take 325 mg by mouth daily.    . cetirizine (ZYRTEC) 10 MG tablet Take 10 mg by mouth daily.    . Cholecalciferol (VITAMIN D-3) 1000 UNITS CAPS Take 1 capsule by mouth daily.     . clopidogrel (PLAVIX) 75 MG tablet TAKE 1 TABLET BY MOUTH  DAILY 90 tablet 2  . cyclobenzaprine (FLEXERIL) 5 MG tablet Take 1 tablet (5 mg total) by mouth at bedtime as needed for muscle spasms. 30 tablet 0  . doxazosin (CARDURA) 4 MG tablet Take 4 mg by mouth at bedtime.     [name] ezetimibe (ZETIA) 10 MG tablet TAKE 1 TABLET BY MOUTH  DAILY 90 tablet 2  . fluticasone (FLONASE) 50 MCG/ACT nasal spray Place 2 sprays into both nostrils daily as needed for allergies or rhinitis.     . furosemide (LASIX) 80 MG tablet TAKE 1 TABLET BY MOUTH 2  TIMES DAILY. MAY TAKE AN  ADDITIONAL 80MG  (1 TABLET)  AS NEEDED FOR EDEMA. 180 tablet 2  . Insulin Glargine (BASAGLAR KWIKPEN) 100 UNIT/ML SOPN Inject 20 Units into the skin at bedtime. Reported on 02/18/2016     . isosorbide mononitrate (IMDUR) 60 MG 24 hr tablet Take 1 tablet (60 mg total) by mouth daily. 90 tablet 3  . Multiple Vitamin (MULTIVITAMIN WITH MINERALS) TABS Take 1 tablet by mouth daily.    [name] NITROSTAT 0.4 MG SL tablet Place 1 tablet under the tongue every 5 minutes as needed for chest pain,  max 3 doses, go to er if no relief 25 tablet 2  . ondansetron (ZOFRAN) 8 MG tablet Take 8 mg by mouth every 8 (eight) hours as needed for nausea or vomiting.     . pantoprazole (PROTONIX) 40 MG tablet Take 40 mg by mouth daily.    . potassium chloride (K-DUR,KLOR-CON) 10 MEQ tablet TAKE 2 TABLETS BY MOUTH TWO TIMES DAILY 360 tablet 2  . ramipril (ALTACE) 10 MG capsule Take 1 capsule (10 mg total) by mouth 2 (two) times daily. 180 capsule 3  . Riboflavin 100 MG TABS Take 2  tablets (200 mg total) by mouth daily. 60 tablet 1  . simvastatin (ZOCOR) 20 MG tablet TAKE 1 TABLET BY MOUTH AT  BEDTIME 90 tablet 2  . sodium chloride (MURO 128) 5 % ophthalmic ointment Place 1 drop into both eyes at bedtime. For dry eyes    . traMADol (ULTRAM) 50 MG tablet Take 100 mg by mouth every 6 (six) hours as needed (MIGRAINES).    [name] venlafaxine (EFFEXOR) 100 MG tablet Take 100-200 mg by mouth See admin instructions. 200 mg in the morning then 100 mg at noon    . carvedilol (COREG) 25 MG tablet Take 1 tablet (25 mg total) by mouth 2 (two) times daily with a meal. 180 tablet 3   No facility-administered medications prior to visit.      Allergies:   Calcium channel blockers; Codeine; Lyrica [pregabalin]; Other; Ticlid [ticlopidine hcl]; Morphine and related; Tape; Digoxin and related; Metolazone; Myrbetriq [mirabegron]; Onglyza [saxagliptin]; Penicillins; Spironolactone; Sulfa antibiotics; Tetracyclines & related; Vicodin [hydrocodone-acetaminophen]; and Latex   Social History   Socioeconomic History  . Marital status: Married    Spouse name: [name]  . Number of children: 1  . Years of education: MA  . Highest education level: None  Social Needs  . Financial resource strain: None  . Food insecurity - worry: None  . Food insecurity - inability: None  . Transportation needs - medical: None  . Transportation needs - non-medical: None  Occupational History    Employer: UNEMPLOYED    Comment: Disablity  Tobacco  Use  . Smoking status: Never Smoker  . Smokeless tobacco: Never Used  Substance and Sexual Activity  . Alcohol use: No    Alcohol/week: 0.0 oz  . Drug use: No  . Sexual activity: Yes    Birth control/protection: Surgical  Other Topics Concern  . None  Social History Narrative   Patient lives at home with spouse.     Daughter name is Tourist information centre manager.   Caffeine Use: none     Family History:  The patient's family history includes Diabetes in her brother, father, and paternal grandmother; Heart attack in her brother, father, and mother; Heart disease in her brother, father, and mother; Hypertension in her father and mother; Kidney failure in her brother and father; Stroke in her father and mother.   ROS:   Please see the history of present illness.    Lethargy with increased daytime sleepiness. All other systems reviewed and are negative.   PHYSICAL EXAM:   VS:  BP 116/84   Pulse 77   Ht 5' 4.5" (1.638 m)   Wt 170 lb 12.8 oz (77.5 kg)   BMI 28.86 kg/m    GEN: Well nourished, well developed, in no acute distress.  Obese. HEENT: normal  Neck: no JVD, carotid bruits, or masses Cardiac: RRR; no murmurs, rubs, or gallops,no edema  Respiratory:  clear to auscultation bilaterally, normal work of breathing GI: soft, nontender, nondistended, + BS MS: no deformity or atrophy  Skin: warm and dry, no rash Neuro:  Alert and Oriented x 3, Strength and sensation are intact Psych: euthymic mood, full affect  Wt Readings from Last 3 Encounters:  11/18/17 170 lb 12.8 oz (77.5 kg)  09/28/17 167 lb (75.8 kg)  05/02/17 177 lb (80.3 kg)      Studies/Labs Reviewed:   EKG:  EKG  Not repeated  Recent Labs: No results found for requested labs within last 8760 hours.   Lipid Panel    Component Value Date/Time  CHOL 133 01/02/2016 0730   TRIG 131 01/02/2016 0730   HDL 41 01/02/2016 0730   CHOLHDL 3.2 01/02/2016 0730   VLDL 26 01/02/2016 0730   LDLCALC 66 01/02/2016 0730    Additional  studies/ records that were reviewed today include:  None    ASSESSMENT:    1. Chronic diastolic heart failure ([place])   2. Coronary artery disease involving coronary bypass graft of native heart with angina pectoris ([place])   3. Essential hypertension   4. Ischemic cardiomyopathy      PLAN:  In order of problems listed above:  1. Because of increased symptoms of lethargy and daytime sleepiness, will switch back to metoprolol to use the succinate preparation 100 mg/day for 2 week then increase to 150 mg/day.  Return appointment in 1 month at which time the metoprolol may be further titrated to 200 mg/day. 2. Stable without angina.  Monitor for anginal complaints with medication changes. 3. Adequate control. 4. Long-acting metoprolol as the patient is unable to tolerate carvedilol  89-month clinical follow-up of overall underlying cardiac condition.  1 month follow-up to further uptitrate metoprolol succinate 200 mg daily if able to tolerate.  Medication Adjustments/Labs and Tests Ordered: Current medicines are reviewed at length with the patient today.  Concerns regarding medicines are outlined above.  Medication changes, Labs and Tests ordered today are listed in the Patient Instructions below. Patient Instructions  Medication Instructions:  1) DISCONTINUE Carvedilol 2) START Metoprolol Succinate 100mg  once daily for 2 weeks and then increase to 1.5 tablets (150mg ) once daily.  Labwork: None  Testing/Procedures: None  Follow-Up: Your physician recommends that you schedule a follow-up appointment in: 1 month with Dr. Tamala Julian (Can have 11:40A on 2/6 or 2/8)   Any Other Special Instructions Will Be Listed Below (If Applicable).     If you need a refill on your cardiac medications before your next appointment, please call your pharmacy.      Signed, Sinclair Grooms, MD  11/18/2017 3:39 PM    [facility] [address], [place], [place]   [address] Phone: [phone]; Fax: [phone]

## 2017-11-18 NOTE — Patient Instructions (Addendum)
Medication Instructions:  1) DISCONTINUE Carvedilol 2) START Metoprolol Succinate 100mg  once daily for 2 weeks and then increase to 1.5 tablets (150mg ) once daily.  Labwork: None  Testing/Procedures: None  Follow-Up: Your physician recommends that you schedule a follow-up appointment in: 1 month with Dr. Tamala Julian (Can have 11:40A on 2/6 or 2/8)   Any Other Special Instructions Will Be Listed Below (If Applicable).     If you need a refill on your cardiac medications before your next appointment, please call your pharmacy.

## 2017-11-21 ENCOUNTER — Other Ambulatory Visit: Payer: Self-pay | Admitting: Interventional Cardiology

## 2017-11-21 ENCOUNTER — Telehealth: Payer: Self-pay | Admitting: Interventional Cardiology

## 2017-11-21 NOTE — Telephone Encounter (Signed)
Pt's pharmacy [facility] mail order is requesting clarification on pt's metoprolol, there is two listed on pt's medication list. Is pt supposed to be on both the lopressor and succinate. Please advise.

## 2017-11-22 ENCOUNTER — Other Ambulatory Visit: Payer: Self-pay | Admitting: *Deleted

## 2017-11-22 DIAGNOSIS — D631 Anemia in chronic kidney disease: Secondary | ICD-10-CM | POA: Diagnosis not present

## 2017-11-22 DIAGNOSIS — N183 Chronic kidney disease, stage 3 (moderate): Secondary | ICD-10-CM | POA: Diagnosis not present

## 2017-11-22 DIAGNOSIS — N2581 Secondary hyperparathyroidism of renal origin: Secondary | ICD-10-CM | POA: Diagnosis not present

## 2017-11-22 DIAGNOSIS — I129 Hypertensive chronic kidney disease with stage 1 through stage 4 chronic kidney disease, or unspecified chronic kidney disease: Secondary | ICD-10-CM | POA: Diagnosis not present

## 2017-11-22 LAB — CUP PACEART REMOTE DEVICE CHECK
Battery Voltage: 2.98 V
Brady Statistic RA Percent Paced: 0.04 %
Brady Statistic RV Percent Paced: 4.22 %
HighPow Impedance: 40 Ohm
HighPow Impedance: 51 Ohm
Implantable Lead Implant Date: 20090610
Implantable Lead Implant Date: 20090610
Implantable Lead Location: 753858
Implantable Lead Location: 753860
Implantable Lead Model: 4194
Implantable Lead Model: 5076
Implantable Pulse Generator Implant Date: 20160323
Lead Channel Impedance Value: 475 Ohm
Lead Channel Impedance Value: 532 Ohm
Lead Channel Pacing Threshold Pulse Width: 0.4 ms
Lead Channel Pacing Threshold Pulse Width: 0.4 ms
Lead Channel Pacing Threshold Pulse Width: 0.4 ms
Lead Channel Sensing Intrinsic Amplitude: 1.875 mV
Lead Channel Sensing Intrinsic Amplitude: 1.875 mV
Lead Channel Sensing Intrinsic Amplitude: 2.875 mV
Lead Channel Sensing Intrinsic Amplitude: 2.875 mV
Lead Channel Setting Pacing Amplitude: 1.5 V
Lead Channel Setting Pacing Pulse Width: 0.4 ms
Lead Channel Setting Pacing Pulse Width: 0.4 ms
MDC IDC LEAD IMPLANT DT: 20090610
MDC IDC LEAD LOCATION: 753859
MDC IDC MSMT BATTERY REMAINING LONGEVITY: 83 mo
MDC IDC MSMT LEADCHNL LV IMPEDANCE VALUE: 285 Ohm
MDC IDC MSMT LEADCHNL LV IMPEDANCE VALUE: 703 Ohm
MDC IDC MSMT LEADCHNL LV PACING THRESHOLD AMPLITUDE: 0.875 V
MDC IDC MSMT LEADCHNL RA PACING THRESHOLD AMPLITUDE: 0.75 V
MDC IDC MSMT LEADCHNL RV IMPEDANCE VALUE: 361 Ohm
MDC IDC MSMT LEADCHNL RV IMPEDANCE VALUE: 456 Ohm
MDC IDC MSMT LEADCHNL RV PACING THRESHOLD AMPLITUDE: 0.875 V
MDC IDC SESS DTM: 20190103072724
MDC IDC SET LEADCHNL LV PACING AMPLITUDE: 2 V
MDC IDC SET LEADCHNL RV PACING AMPLITUDE: 2 V
MDC IDC SET LEADCHNL RV SENSING SENSITIVITY: 0.3 mV
MDC IDC STAT BRADY AP VP PERCENT: 0.03 %
MDC IDC STAT BRADY AP VS PERCENT: 0.01 %
MDC IDC STAT BRADY AS VP PERCENT: 98.61 %
MDC IDC STAT BRADY AS VS PERCENT: 1.34 %

## 2017-11-22 NOTE — Telephone Encounter (Signed)
Pt should be on Metoprolol Succinate per last office note.

## 2017-11-22 NOTE — Telephone Encounter (Signed)
[id number] returned my call. She states that she was able to have the medication filled at [facility], although it was a bit expensive she feels better on this medication than she did on the carvedilol. She went on to say that it was well worth the cost. She informs that it will be more cost effective through her mail order pharmacy but she is going to wait until she returns for her follow up visit with Dr Tamala Julian next month to have it sent there, in case her dose is adjusted further. She states that she actually was going to call and thank Dr Tamala Julian for making this change as she is currently out shopping and this is something that she was not able to do while she was taking the carvedilol.

## 2017-11-22 NOTE — Telephone Encounter (Signed)
Discussed with Anderson Malta, Dr Darliss Ridgel nurse and she stated that the patient was taking carvedilol which caused fatigue. Dr Tamala Julian started patient on metoprolol succinate at last visit and she was provided a paper rx to present to the pharmacy to be filled. She is scheduled to return for an office visit for possible up-titration of this medication. Called patients home phone to see if she was able to get this filled and spoke with patients husband as the patient was not at home. He will have the patient call the office to discuss. Called patients cell phone but did not get an answer and vm is unidentified.

## 2017-11-24 ENCOUNTER — Telehealth: Payer: Self-pay | Admitting: Interventional Cardiology

## 2017-11-24 NOTE — Telephone Encounter (Signed)
New Message   Pt c/o medication issue:  1. Name of Medication: metoprolol succinate 2. How are you currently taking this medication (dosage and times per day)?100mg   3. Are you having a reaction (difficulty breathing--STAT)? No  4. What is your medication issue? [name] is calling from [employer]. She is wanting to clarify the dosage for the metoprolol. Please call.

## 2017-11-24 NOTE — Telephone Encounter (Signed)
Spoke with Percell Miller, [facility] at [facility]. He wanted to clarify if pt was to be on Metoprolol or Carvedilol.  Advised carvedilol was d/c'ed at appt and pt was switched to Metoprolol Succinate.  Clarified the Metoprolol order because they received an order for tartrate, not succinate.  Advised that pt obtained a month supply to get her to her next appt with [place] because out goal is to titrate pt to 200mg  QD.  Percell Miller said he would cancel order and they will await new prescription once pt is seen back.  Percell Miller appreciative for call.

## 2017-12-09 DIAGNOSIS — M5136 Other intervertebral disc degeneration, lumbar region: Secondary | ICD-10-CM | POA: Diagnosis not present

## 2017-12-09 DIAGNOSIS — M5416 Radiculopathy, lumbar region: Secondary | ICD-10-CM | POA: Diagnosis not present

## 2017-12-12 ENCOUNTER — Other Ambulatory Visit: Payer: Self-pay

## 2017-12-12 ENCOUNTER — Emergency Department (HOSPITAL_COMMUNITY)
Admission: EM | Admit: 2017-12-12 | Discharge: 2017-12-12 | Discharge status: Against Medical Advice | Payer: Medicare HMO | Attending: Emergency Medicine | Admitting: Emergency Medicine

## 2017-12-12 ENCOUNTER — Encounter (HOSPITAL_COMMUNITY): Payer: Self-pay | Admitting: Emergency Medicine

## 2017-12-12 ENCOUNTER — Emergency Department (HOSPITAL_COMMUNITY): Payer: Medicare HMO

## 2017-12-12 DIAGNOSIS — Z5321 Procedure and treatment not carried out due to patient leaving prior to being seen by health care provider: Secondary | ICD-10-CM | POA: Insufficient documentation

## 2017-12-12 DIAGNOSIS — R079 Chest pain, unspecified: Secondary | ICD-10-CM | POA: Insufficient documentation

## 2017-12-12 LAB — CBC
HCT: 32.4 % — ABNORMAL LOW (ref 36.0–46.0)
Hemoglobin: 10.7 g/dL — ABNORMAL LOW (ref 12.0–15.0)
MCH: 29 pg (ref 26.0–34.0)
MCHC: 33 g/dL (ref 30.0–36.0)
MCV: 87.8 fL (ref 78.0–100.0)
PLATELETS: 203 10*3/uL (ref 150–400)
RBC: 3.69 MIL/uL — ABNORMAL LOW (ref 3.87–5.11)
RDW: 13.2 % (ref 11.5–15.5)
WBC: 14.7 10*3/uL — AB (ref 4.0–10.5)

## 2017-12-12 LAB — BASIC METABOLIC PANEL
Anion gap: 14 (ref 5–15)
BUN: 28 mg/dL — AB (ref 6–20)
CHLORIDE: 99 mmol/L — AB (ref 101–111)
CO2: 27 mmol/L (ref 22–32)
CREATININE: 2.03 mg/dL — AB (ref 0.44–1.00)
Calcium: 9.1 mg/dL (ref 8.9–10.3)
GFR calc Af Amer: 29 mL/min — ABNORMAL LOW (ref >60)
GFR calc non Af Amer: 25 mL/min — ABNORMAL LOW (ref >60)
Glucose, Bld: 254 mg/dL — ABNORMAL HIGH (ref 65–99)
Potassium: 4 mmol/L (ref 3.5–5.1)
SODIUM: 140 mmol/L (ref 135–145)

## 2017-12-12 LAB — I-STAT TROPONIN, ED: Troponin i, poc: 0.02 ng/mL (ref 0.00–0.08)

## 2017-12-12 NOTE — ED Notes (Signed)
Pt's family to the desk stating pt is wanting to leave, advised pt and family to stay and be seen. Pt seen leaving with her family.

## 2017-12-12 NOTE — ED Triage Notes (Signed)
Pt having 8/10 cp for the past 45 min with SOB got 2 nitros on her own with no relief.

## 2017-12-13 NOTE — Progress Notes (Signed)
Cardiology Office Note    Date:  12/14/2017   ID:  [name], [name] 22-Sep-1951, MRN [id number]  PCP:  [name], MD  Cardiologist: Sinclair Grooms, MD   Chief Complaint  Patient presents with  . Coronary Artery Disease  . Hypertension    History of Present Illness:  [name] is a 67 y.o. female presents for CAD, CHF, Defibrillator, ischemic cardiomyopathy, mitral valve disease with moderate regurgitation, diabetes mellitus, and ischemic stroke history    She feels well today.  Has been having back pain and was started on a prednisone Dosepak 1 week ago.  She had an emergency room visit 2 days ago because of left lower back pain that radiated up into her chest.  She took nitroglycerin.  The discomfort was severe and improved after several nitroglycerin tablets.  She did not stay to be evaluated by the ER physician.  Blood work was obtained and there was no evidence of myocardial necrosis.  EKG was unchanged.  States that the discomfort was associated with a tremor in her chest that is poorly characterized but raises the question of tachycardia/arrhythmia.   Past Medical History:  Diagnosis Date  . AICD (automatic cardioverter/defibrillator) present    [employer]- Dr. Beckie Salts follows  . Anginal pain ([place])   . Anxiety   . Asthma   . Back pain    "pinched nerve-lower back" - Dr. Nelva Bush follows.  . Cervical dysplasia   . CHF (congestive heart failure) ([place])   . Chronic kidney disease    Dr. Posey Pronto follows.  . Complication of anesthesia    "I wake up during surgeries" (02/14/2013)  . Coronary artery disease   . Depression   . Fibroid   . Function kidney decreased   . GERD (gastroesophageal reflux disease)   . Hepatitis    Hepatitis A -college yrs"water source exposure"  . Hiatal hernia   . History of shingles    2-3 yrs ago last out break "around waist"  . History of stomach ulcers   . Hypertension   . ICD (implantable cardiac defibrillator) in place    . Iron deficiency anemia   . Ischemic cardiomyopathy    status post biventricular ICD placed by DR Edumunds who used to see Dr Melvern Banker here to establish  cardiovascular care.  . Migraines   . MVP (mitral valve prolapse)    Antibiotics not required for procedures  . Myocardial infarction ([place])    "I've had 2; the others they were able to catch before completing" (02/14/2013)  . Pacemaker   . Pneumonia 1950's & 1985  . Shortness of breath    "lying down flat; at times w/exertion" (02/14/2013). 10-06-15 exertion only..  . Stroke [facility])    "2 confirmed; 9 TIA's; results in dragging LLE; numbness in tip of tongue" (02/14/2013),10-06-15 right hand tends to be weaker when tired.  . Type II diabetes mellitus ([place])     Past Surgical History:  Procedure Laterality Date  . ABDOMINAL HYSTERECTOMY  1985   TAH   . BIV ICD GENERTAOR CHANGE OUT  06/2007; 04/2008   "2 lead initial placement, [facility]; done at [facility], after developing CHF" (02/14/2013)  . BIV PACEMAKER GENERATOR CHANGE OUT N/A 01/29/2015   Procedure: BIV PACEMAKER GENERATOR CHANGE OUT;  Surgeon: Evans Lance, MD;  Location: [facility] CATH LAB;  Service: Cardiovascular;  Laterality: N/A;  . BREAST EXCISIONAL BIOPSY Left 01/2007; 06/2007; 03/2008   "benign" (02/14/2013)  . BREAST SURGERY    .  CARDIAC CATHETERIZATION     "probably in the teens" (02/14/2013)  . CARDIAC DEFIBRILLATOR PLACEMENT    . COLONOSCOPY  ~ 2002  . COLONOSCOPY WITH PROPOFOL N/A 10/07/2015   Procedure: COLONOSCOPY WITH PROPOFOL;  Surgeon: Juanita Craver, MD;  Location: [facility] ENDOSCOPY;  Service: Endoscopy;  Laterality: N/A;  . CORONARY ANGIOPLASTY WITH STENT PLACEMENT     "started out w/5; bypass corrected some; 1 stent since the bypass" (02/14/2013)  . CORONARY ARTERY BYPASS GRAFT  ` 1998   LIMA-LAD, SVG-D1, SVG-PDA  . [place] OF UTERUS  1975 X 2; 1976; 1977  . INSERT / REPLACE / REMOVE PACEMAKER     biventricular defibrillator--06/10/ 2009  . LEFT HEART CATHETERIZATION WITH  CORONARY/GRAFT ANGIOGRAM N/A 01/03/2015   Procedure: LEFT HEART CATHETERIZATION WITH Beatrix Fetters;  Surgeon: Sinclair Grooms, MD;  Location: [facility] CATH LAB;  Service: Cardiovascular;  Laterality: N/A;  . SUPRAVENTRICULAR TACHYCARDIA ABLATION  06/2007  . TEE WITHOUT CARDIOVERSION  11/14/2012   Procedure: TRANSESOPHAGEAL ECHOCARDIOGRAM (TEE);  Surgeon: Candee Furbish, MD;  Location: [facility] ENDOSCOPY;  Service: Cardiovascular;  Laterality: N/A;    Current Medications: Outpatient Medications Prior to Visit  Medication Sig Dispense Refill  . albuterol (PROVENTIL HFA;VENTOLIN HFA) 108 (90 BASE) MCG/ACT inhaler Inhale 2 puffs into the lungs every 4 (four) hours as needed for wheezing or shortness of breath.    . ALPRAZolam (XANAX) 0.5 MG tablet Take 0.5-1 tablets by mouth 3 (three) times daily. 1 mg in the morning then 0.5 mg at noon then 0.5 mg at bedtime    . Artificial Tear GEL Place 2 drops into both eyes daily as needed (dry eyes).     [name] aspirin 325 MG tablet Take 325 mg by mouth daily.    . cetirizine (ZYRTEC) 10 MG tablet Take 10 mg by mouth daily.    . Cholecalciferol (VITAMIN D-3) 1000 UNITS CAPS Take 1 capsule by mouth daily.     . clopidogrel (PLAVIX) 75 MG tablet TAKE 1 TABLET BY MOUTH  DAILY 90 tablet 2  . cyclobenzaprine (FLEXERIL) 5 MG tablet Take 1 tablet (5 mg total) by mouth at bedtime as needed for muscle spasms. 30 tablet 0  . doxazosin (CARDURA) 4 MG tablet Take 4 mg by mouth at bedtime.     [name] ezetimibe (ZETIA) 10 MG tablet TAKE 1 TABLET BY MOUTH  DAILY 90 tablet 2  . fluticasone (FLONASE) 50 MCG/ACT nasal spray Place 2 sprays into both nostrils daily as needed for allergies or rhinitis.     . furosemide (LASIX) 80 MG tablet TAKE 1 TABLET BY MOUTH 2  TIMES DAILY. MAY TAKE AN  ADDITIONAL 80MG  (1 TABLET)  AS NEEDED FOR EDEMA. 270 tablet 3  . Insulin Glargine (BASAGLAR KWIKPEN) 100 UNIT/ML SOPN Inject 20 Units into the skin at bedtime. Reported on 02/18/2016     . isosorbide  mononitrate (IMDUR) 60 MG 24 hr tablet Take 1 tablet (60 mg total) by mouth daily. 90 tablet 3  . Multiple Vitamin (MULTIVITAMIN WITH MINERALS) TABS Take 1 tablet by mouth daily.    [name] NITROSTAT 0.4 MG SL tablet Place 1 tablet under the tongue every 5 minutes as needed for chest pain, max 3 doses, go to er if no relief 25 tablet 2  . ondansetron (ZOFRAN) 8 MG tablet Take 8 mg by mouth every 8 (eight) hours as needed for nausea or vomiting.     . pantoprazole (PROTONIX) 40 MG tablet Take 40 mg by mouth daily.    [name]  potassium chloride (K-DUR,KLOR-CON) 10 MEQ tablet TAKE 2 TABLETS BY MOUTH TWO TIMES DAILY 360 tablet 2  . predniSONE (STERAPRED UNI-PAK 21 TAB) 10 MG (21) TBPK tablet Use as directed by package insert.  0  . ramipril (ALTACE) 10 MG capsule Take 1 capsule (10 mg total) by mouth 2 (two) times daily. 180 capsule 3  . Riboflavin 100 MG TABS Take 2 tablets (200 mg total) by mouth daily. 60 tablet 1  . simvastatin (ZOCOR) 20 MG tablet TAKE 1 TABLET BY MOUTH AT  BEDTIME 90 tablet 2  . sodium chloride (MURO 128) 5 % ophthalmic ointment Place 1 drop into both eyes at bedtime. For dry eyes    . traMADol (ULTRAM) 50 MG tablet Take 100 mg by mouth every 6 (six) hours as needed (MIGRAINES).    [name] venlafaxine (EFFEXOR) 100 MG tablet Take 100-200 mg by mouth See admin instructions. 200 mg in the morning then 100 mg at noon    . metoprolol succinate (TOPROL-XL) 100 MG 24 hr tablet Take 1 tablet (100mg  total) by mouth once daily immediately following a meal for 2 weeks. Then increase to 1.5 tablets (150mg  total) by mouth daily. 35 tablet 1  . metoprolol succinate (TOPROL-XL) 100 MG 24 hr tablet Take 150 mg by mouth daily. Immediately following a meal.     No facility-administered medications prior to visit.      Allergies:   Calcium channel blockers; Codeine; Lyrica [pregabalin]; Other; Ticlid [ticlopidine hcl]; Morphine and related; Tape; Digoxin and related; Metolazone; Myrbetriq [mirabegron]; Onglyza  [saxagliptin]; Penicillins; Spironolactone; Sulfa antibiotics; Tetracyclines & related; Vicodin [hydrocodone-acetaminophen]; and Latex   Social History   Socioeconomic History  . Marital status: Married    Spouse name: [name]  . Number of children: 1  . Years of education: MA  . Highest education level: None  Social Needs  . Financial resource strain: None  . Food insecurity - worry: None  . Food insecurity - inability: None  . Transportation needs - medical: None  . Transportation needs - non-medical: None  Occupational History    Employer: UNEMPLOYED    Comment: Disablity  Tobacco Use  . Smoking status: Never Smoker  . Smokeless tobacco: Never Used  Substance and Sexual Activity  . Alcohol use: No    Alcohol/week: 0.0 oz  . Drug use: No  . Sexual activity: Yes    Birth control/protection: Surgical  Other Topics Concern  . None  Social History Narrative   Patient lives at home with spouse.     Daughter name is Tourist information centre manager.   Caffeine Use: none     Family History:  The patient's family history includes Diabetes in her brother, father, and paternal grandmother; Heart attack in her brother, father, and mother; Heart disease in her brother, father, and mother; Hypertension in her father and mother; Kidney failure in her brother and father; Stroke in her father and mother.   ROS:   Please see the history of present illness.    Occasional orthopnea.  Significant back pain currently for which she is successfully weaning from a steroid Dosepak. All other systems reviewed and are negative.   PHYSICAL EXAM:   VS:  BP (!) 160/94   Pulse 61   Ht 5' 4.5" (1.638 m)   Wt 171 lb 6.4 oz (77.7 kg)   BMI 28.97 kg/m    GEN: Well nourished, well developed, in no acute distress  HEENT: normal  Neck: no JVD, carotid bruits, or masses Cardiac: RRR; no murmurs,  rubs, or gallops,no edema  Respiratory:  clear to auscultation bilaterally, normal work of breathing GI: soft, nontender,  nondistended, + BS MS: no deformity or atrophy  Skin: warm and dry, no rash Neuro:  Alert and Oriented x 3, Strength and sensation are intact Psych: euthymic mood, full affect  Wt Readings from Last 3 Encounters:  12/14/17 171 lb 6.4 oz (77.7 kg)  12/12/17 170 lb (77.1 kg)  11/18/17 170 lb 12.8 oz (77.5 kg)      Studies/Labs Reviewed:   EKG:  EKG reviewed the EKG performed on December 12, 2017 at the [facility] emergency room when she had chest pain quivering in her chest.  It revealed pacing with no acute ST-T change and no significant difference compared to historical.  Tracings were personally reviewed.  Recent Labs: 12/12/2017: BUN 28; Creatinine, Ser 2.03; Hemoglobin 10.7; Platelets 203; Potassium 4.0; Sodium 140   Lipid Panel    Component Value Date/Time   CHOL 133 01/02/2016 0730   TRIG 131 01/02/2016 0730   HDL 41 01/02/2016 0730   CHOLHDL 3.2 01/02/2016 0730   VLDL 26 01/02/2016 0730   LDLCALC 66 01/02/2016 0730    Additional studies/ records that were reviewed today include:  Laboratory data from the recent emergency room visit was reviewed.  Troponin was negative x1.    ASSESSMENT:    1. Chronic diastolic heart failure ([place])   2. Coronary artery disease involving coronary bypass graft of native heart with angina pectoris ([place])   3. Essential hypertension   4. Ischemic cardiomyopathy   5. MVP (mitral valve prolapse)   6. Paroxysmal SVT (supraventricular tachycardia) ([facility])   7. Cerebral infarction due to thrombosis of basilar artery ([facility])      PLAN:  In order of problems listed above:  1. No evidence of volume overload 2. Chest discomfort with no evidence of infarction.  Chest pain led to an emergency room visit for which she did not stay long enough to be seen by the physician but due to overcrowding.  Cardiac marker was negative.  EKG was nonischemic.  No recurrence of discomfort since the discomfort resolved.  Her description raises the question of whether  arrhythmia could have been the inciting component. 3. Elevated today likely with some impact from prednisone Dosepak.  Increase Toprol-XL to 200 mg/day.  She will continue to monitor blood pressure with target being less than 140/90 mmHg.  My personal measurements today were 145/88 mmHg left arm and 150/90 mmHg right arm 4. Not addressed 5. Not addressed 6. Possibly related to the episode of chest pain that required emergency room evaluation.  Toprol-XL 200 mg/day.  Follow blood pressures.  Blood pressure will improve off prednisone Dosepak.  Clinical follow-up in 6 months.    Medication Adjustments/Labs and Tests Ordered: Current medicines are reviewed at length with the patient today.  Concerns regarding medicines are outlined above.  Medication changes, Labs and Tests ordered today are listed in the Patient Instructions below. There are no Patient Instructions on file for this visit.   Signed, Sinclair Grooms, MD  12/14/2017 12:29 PM    [facility] [place], [place], [place]  [address] Phone: [phone]; Fax: [phone]

## 2017-12-14 ENCOUNTER — Ambulatory Visit (INDEPENDENT_AMBULATORY_CARE_PROVIDER_SITE_OTHER): Payer: 59 | Admitting: Interventional Cardiology

## 2017-12-14 ENCOUNTER — Encounter: Payer: Self-pay | Admitting: Interventional Cardiology

## 2017-12-14 VITALS — BP 160/94 | HR 61 | Ht 64.5 in | Wt 171.4 lb

## 2017-12-14 DIAGNOSIS — I341 Nonrheumatic mitral (valve) prolapse: Secondary | ICD-10-CM | POA: Diagnosis not present

## 2017-12-14 DIAGNOSIS — I6302 Cerebral infarction due to thrombosis of basilar artery: Secondary | ICD-10-CM

## 2017-12-14 DIAGNOSIS — I5032 Chronic diastolic (congestive) heart failure: Secondary | ICD-10-CM

## 2017-12-14 DIAGNOSIS — I25709 Atherosclerosis of coronary artery bypass graft(s), unspecified, with unspecified angina pectoris: Secondary | ICD-10-CM | POA: Diagnosis not present

## 2017-12-14 DIAGNOSIS — I471 Supraventricular tachycardia: Secondary | ICD-10-CM

## 2017-12-14 DIAGNOSIS — I10 Essential (primary) hypertension: Secondary | ICD-10-CM | POA: Diagnosis not present

## 2017-12-14 DIAGNOSIS — I255 Ischemic cardiomyopathy: Secondary | ICD-10-CM | POA: Diagnosis not present

## 2017-12-14 MED ORDER — METOPROLOL SUCCINATE 200 MG PO CS24: 200.0000 mg | EXTENDED_RELEASE_CAPSULE | Freq: Every day | ORAL | 3 refills | Status: DC

## 2017-12-14 NOTE — Patient Instructions (Signed)
Medication Instructions:  1) INCREASE Metoprolol Succinate to 200mg  once daily  Labwork: None  Testing/Procedures: None  Follow-Up: Your physician wants you to follow-up in: 6 months with Dr. Tamala Julian.  You will receive a reminder letter in the mail two months in advance. If you don't receive a letter, please call our office to schedule the follow-up appointment.   Any Other Special Instructions Will Be Listed Below (If Applicable).     If you need a refill on your cardiac medications before your next appointment, please call your pharmacy.

## 2017-12-15 DIAGNOSIS — F332 Major depressive disorder, recurrent severe without psychotic features: Secondary | ICD-10-CM | POA: Diagnosis not present

## 2017-12-15 DIAGNOSIS — F411 Generalized anxiety disorder: Secondary | ICD-10-CM | POA: Diagnosis not present

## 2017-12-17 ENCOUNTER — Other Ambulatory Visit: Payer: Self-pay | Admitting: Interventional Cardiology

## 2017-12-18 ENCOUNTER — Encounter (HOSPITAL_BASED_OUTPATIENT_CLINIC_OR_DEPARTMENT_OTHER): Payer: Self-pay | Admitting: Emergency Medicine

## 2017-12-18 ENCOUNTER — Emergency Department (HOSPITAL_BASED_OUTPATIENT_CLINIC_OR_DEPARTMENT_OTHER)
Admission: EM | Admit: 2017-12-18 | Discharge: 2017-12-18 | Disposition: A | Discharge status: Home / Self Care | Payer: Medicare HMO | Attending: Emergency Medicine | Admitting: Emergency Medicine

## 2017-12-18 ENCOUNTER — Other Ambulatory Visit: Payer: Self-pay

## 2017-12-18 DIAGNOSIS — Z79899 Other long term (current) drug therapy: Secondary | ICD-10-CM | POA: Insufficient documentation

## 2017-12-18 DIAGNOSIS — I2510 Atherosclerotic heart disease of native coronary artery without angina pectoris: Secondary | ICD-10-CM | POA: Diagnosis not present

## 2017-12-18 DIAGNOSIS — I5032 Chronic diastolic (congestive) heart failure: Secondary | ICD-10-CM | POA: Insufficient documentation

## 2017-12-18 DIAGNOSIS — Z7982 Long term (current) use of aspirin: Secondary | ICD-10-CM | POA: Diagnosis not present

## 2017-12-18 DIAGNOSIS — D649 Anemia, unspecified: Secondary | ICD-10-CM | POA: Diagnosis not present

## 2017-12-18 DIAGNOSIS — J45909 Unspecified asthma, uncomplicated: Secondary | ICD-10-CM | POA: Diagnosis not present

## 2017-12-18 DIAGNOSIS — I130 Hypertensive heart and chronic kidney disease with heart failure and stage 1 through stage 4 chronic kidney disease, or unspecified chronic kidney disease: Secondary | ICD-10-CM | POA: Insufficient documentation

## 2017-12-18 DIAGNOSIS — Z91040 Latex allergy status: Secondary | ICD-10-CM | POA: Insufficient documentation

## 2017-12-18 DIAGNOSIS — Z95810 Presence of automatic (implantable) cardiac defibrillator: Secondary | ICD-10-CM | POA: Insufficient documentation

## 2017-12-18 DIAGNOSIS — T783XXA Angioneurotic edema, initial encounter: Secondary | ICD-10-CM

## 2017-12-18 DIAGNOSIS — N183 Chronic kidney disease, stage 3 (moderate): Secondary | ICD-10-CM | POA: Insufficient documentation

## 2017-12-18 DIAGNOSIS — T7840XA Allergy, unspecified, initial encounter: Secondary | ICD-10-CM | POA: Diagnosis present

## 2017-12-18 DIAGNOSIS — N289 Disorder of kidney and ureter, unspecified: Secondary | ICD-10-CM | POA: Diagnosis not present

## 2017-12-18 LAB — CBC WITH DIFFERENTIAL/PLATELET
Basophils Absolute: 0 10*3/uL (ref 0.0–0.1)
Basophils Relative: 0 %
EOS ABS: 0.1 10*3/uL (ref 0.0–0.7)
Eosinophils Relative: 1 %
HEMATOCRIT: 34.2 % — AB (ref 36.0–46.0)
HEMOGLOBIN: 11.6 g/dL — AB (ref 12.0–15.0)
LYMPHS ABS: 3.8 10*3/uL (ref 0.7–4.0)
Lymphocytes Relative: 34 %
MCH: 29.8 pg (ref 26.0–34.0)
MCHC: 33.9 g/dL (ref 30.0–36.0)
MCV: 87.9 fL (ref 78.0–100.0)
MONOS PCT: 9 %
Monocytes Absolute: 1 10*3/uL (ref 0.1–1.0)
NEUTROS ABS: 6.3 10*3/uL (ref 1.7–7.7)
NEUTROS PCT: 56 %
Platelets: 204 10*3/uL (ref 150–400)
RBC: 3.89 MIL/uL (ref 3.87–5.11)
RDW: 13 % (ref 11.5–15.5)
WBC: 11.2 10*3/uL — AB (ref 4.0–10.5)

## 2017-12-18 LAB — BASIC METABOLIC PANEL
ANION GAP: 9 (ref 5–15)
BUN: 26 mg/dL — ABNORMAL HIGH (ref 6–20)
CHLORIDE: 100 mmol/L — AB (ref 101–111)
CO2: 33 mmol/L — AB (ref 22–32)
CREATININE: 2.14 mg/dL — AB (ref 0.44–1.00)
Calcium: 8.6 mg/dL — ABNORMAL LOW (ref 8.9–10.3)
GFR calc non Af Amer: 23 mL/min — ABNORMAL LOW (ref >60)
GFR, EST AFRICAN AMERICAN: 27 mL/min — AB (ref >60)
Glucose, Bld: 85 mg/dL (ref 65–99)
POTASSIUM: 3.9 mmol/L (ref 3.5–5.1)
SODIUM: 142 mmol/L (ref 135–145)

## 2017-12-18 MED ORDER — FAMOTIDINE IN NACL 20-0.9 MG/50ML-% IV SOLN
20.0000 mg | Freq: Once | INTRAVENOUS | Status: AC
  Administered 2017-12-18: 20 mg via INTRAVENOUS

## 2017-12-18 MED ORDER — METHYLPREDNISOLONE SODIUM SUCC 125 MG IJ SOLR
125.0000 mg | Freq: Once | INTRAMUSCULAR | Status: AC
  Administered 2017-12-18: 125 mg via INTRAVENOUS

## 2017-12-18 MED ORDER — DIPHENHYDRAMINE HCL 50 MG/ML IJ SOLN
INTRAMUSCULAR | Status: AC
  Administered 2017-12-18: 50 mg via INTRAVENOUS
  Filled 2017-12-18: qty 1

## 2017-12-18 MED ORDER — METHYLPREDNISOLONE SODIUM SUCC 125 MG IJ SOLR
INTRAMUSCULAR | Status: AC
  Administered 2017-12-18: 125 mg via INTRAVENOUS
  Filled 2017-12-18: qty 2

## 2017-12-18 MED ORDER — EPINEPHRINE 0.3 MG/0.3ML IJ SOAJ
0.3000 mg | Freq: Once | INTRAMUSCULAR | Status: AC
  Administered 2017-12-18: 0.3 mg via INTRAMUSCULAR

## 2017-12-18 MED ORDER — DIPHENHYDRAMINE HCL 50 MG/ML IJ SOLN
50.0000 mg | Freq: Once | INTRAMUSCULAR | Status: AC
  Administered 2017-12-18: 50 mg via INTRAVENOUS

## 2017-12-18 MED ORDER — EPINEPHRINE 0.3 MG/0.3ML IJ SOAJ
INTRAMUSCULAR | Status: AC
  Administered 2017-12-18: 0.3 mg via INTRAMUSCULAR
  Filled 2017-12-18: qty 0.3

## 2017-12-18 MED ORDER — FAMOTIDINE IN NACL 20-0.9 MG/50ML-% IV SOLN
INTRAVENOUS | Status: AC
  Filled 2017-12-18: qty 50

## 2017-12-18 MED ORDER — DIPHENHYDRAMINE HCL 50 MG/ML IJ SOLN: 25.0000 mg | Freq: Once | INTRAMUSCULAR | Status: DC

## 2017-12-18 NOTE — Discharge Instructions (Signed)
Stop Altace as that is most likely cause of you angioedema.  Call Dr. Tamala Julian tomorrow to arrange to be seen in his office this week.  You may need to be started on different medication.  If you develop worsening swelling, difficulty breathing or difficulty swallowing return to the emergency department immediately

## 2017-12-18 NOTE — ED Notes (Signed)
Pt on monitor 

## 2017-12-18 NOTE — ED Provider Notes (Signed)
[name] Provider Note   CSN: [id number] Arrival date & time: 12/18/17  1056     History   Chief Complaint Chief Complaint  Patient presents with  . Allergic Reaction    HPI [name] is a 67 y.o. female.  HPI patient developed swelling of her lips and face approximately 6 AM today, progressively worsening.  She denies any difficulty swallowing or difficulty breathing denies swelling of her tongue.  No treatment prior to coming here.  Recent new medication is metoprolol.  No new foods.  No other associated symptoms nothing makes symptoms better or worse.  Past Medical History:  Diagnosis Date  . AICD (automatic cardioverter/defibrillator) present    [employer]- Dr. Beckie Salts follows  . Anginal pain ([place])   . Anxiety   . Asthma   . Back pain    "pinched nerve-lower back" - Dr. Nelva Bush follows.  . Cervical dysplasia   . CHF (congestive heart failure) ([place])   . Chronic kidney disease    Dr. Posey Pronto follows.  . Complication of anesthesia    "I wake up during surgeries" (02/14/2013)  . Coronary artery disease   . Depression   . Fibroid   . Function kidney decreased   . GERD (gastroesophageal reflux disease)   . Hepatitis    Hepatitis A -college yrs"water source exposure"  . Hiatal hernia   . History of shingles    2-3 yrs ago last out break "around waist"  . History of stomach ulcers   . Hypertension   . ICD (implantable cardiac defibrillator) in place   . Iron deficiency anemia   . Ischemic cardiomyopathy    status post biventricular ICD placed by DR Edumunds who used to see Dr Melvern Banker here to establish  cardiovascular care.  . Migraines   . MVP (mitral valve prolapse)    Antibiotics not required for procedures  . Myocardial infarction ([place])    "I've had 2; the others they were able to catch before completing" (02/14/2013)  . Pacemaker   . Pneumonia 1950's & 1985  . Shortness of breath    "lying down flat; at times w/exertion"  (02/14/2013). 10-06-15 exertion only..  . Stroke [facility])    "2 confirmed; 9 TIA's; results in dragging LLE; numbness in tip of tongue" (02/14/2013),10-06-15 right hand tends to be weaker when tired.  . Type II diabetes mellitus [facility])     Patient Active Problem List   Diagnosis Date Noted  . Hyperlipidemia 07/30/2016  . Female pelvic pain 06/07/2016  . Chronic diastolic heart failure ([place]) 03/22/2016  . Other complicated headache syndrome   . Facial numbness 01/01/2016  . Ischemic cardiomyopathy 01/28/2015  . Mastodynia, female 05/14/2014  . Mass of breast, left 05/14/2014  . Cerebral infarction ([place]) 11/11/2012  . HTN (hypertension) 11/11/2012  . DM (diabetes mellitus) ([place]) 11/11/2012  . Chronic renal insufficiency, stage III (moderate) ([place]) 11/11/2012  . TIA (transient ischemic attack) 11/11/2012  . Hemiplegia, unspecified, affecting nondominant side 11/11/2012  . History of shingles   . MVP (mitral valve prolapse)   . Asthma   . Anxiety   . Depression   . Hiatal hernia   . Cervical dysplasia   . Fibroid   . Biventricular implantable cardioverter-defibrillator in situ 06/18/2011  . Paroxysmal SVT (supraventricular tachycardia) ([place]) 06/18/2011  . Coronary artery disease involving coronary bypass graft of native heart with angina pectoris ([place]) 06/18/2011    Past Surgical History:  Procedure Laterality Date  . ABDOMINAL HYSTERECTOMY  1985   TAH   . BIV ICD GENERTAOR CHANGE OUT  06/2007; 04/2008   "2 lead initial placement, at [facility]; done at [facility], after developing CHF" (02/14/2013)  . BIV PACEMAKER GENERATOR CHANGE OUT N/A 01/29/2015   Procedure: BIV PACEMAKER GENERATOR CHANGE OUT;  Surgeon: Evans Lance, MD;  Location: [facility] CATH LAB;  Service: Cardiovascular;  Laterality: N/A;  . BREAST EXCISIONAL BIOPSY Left 01/2007; 06/2007; 03/2008   "benign" (02/14/2013)  . BREAST SURGERY    . CARDIAC CATHETERIZATION     "probably in the teens" (02/14/2013)  . CARDIAC DEFIBRILLATOR PLACEMENT    .  COLONOSCOPY  ~ 2002  . COLONOSCOPY WITH PROPOFOL N/A 10/07/2015   Procedure: COLONOSCOPY WITH PROPOFOL;  Surgeon: Juanita Craver, MD;  Location: [facility] ENDOSCOPY;  Service: Endoscopy;  Laterality: N/A;  . CORONARY ANGIOPLASTY WITH STENT PLACEMENT     "started out w/5; bypass corrected some; 1 stent since the bypass" (02/14/2013)  . CORONARY ARTERY BYPASS GRAFT  ` 1998   LIMA-LAD, SVG-D1, SVG-PDA  . Gerald OF UTERUS  1975 X 2; 1976; 1977  . INSERT / REPLACE / REMOVE PACEMAKER     biventricular defibrillator--06/10/ 2009  . LEFT HEART CATHETERIZATION WITH CORONARY/GRAFT ANGIOGRAM N/A 01/03/2015   Procedure: LEFT HEART CATHETERIZATION WITH Beatrix Fetters;  Surgeon: Sinclair Grooms, MD;  Location: [facility] CATH LAB;  Service: Cardiovascular;  Laterality: N/A;  . SUPRAVENTRICULAR TACHYCARDIA ABLATION  06/2007  . TEE WITHOUT CARDIOVERSION  11/14/2012   Procedure: TRANSESOPHAGEAL ECHOCARDIOGRAM (TEE);  Surgeon: Candee Furbish, MD;  Location: [facility] ENDOSCOPY;  Service: Cardiovascular;  Laterality: N/A;    OB History    Gravida Para Term Preterm AB Living   7 2   2 5 1    SAB TAB Ectopic Multiple Live Births                   Home Medications    Prior to Admission medications   Medication Sig Start Date End Date Taking? Authorizing Provider  albuterol (PROVENTIL HFA;VENTOLIN HFA) 108 (90 BASE) MCG/ACT inhaler Inhale 2 puffs into the lungs every 4 (four) hours as needed for wheezing or shortness of breath.    [provider]  ALPRAZolam Duanne Moron) 0.5 MG tablet Take 0.5-1 tablets by mouth 3 (three) times daily. 1 mg in the morning then 0.5 mg at noon then 0.5 mg at bedtime 09/09/15   [provider]  Artificial Tear GEL Place 2 drops into both eyes daily as needed (dry eyes).     [provider]  aspirin 325 MG tablet Take 325 mg by mouth daily.    [provider]  cetirizine (ZYRTEC) 10 MG tablet Take 10 mg by mouth daily.    [provider]    Cholecalciferol (VITAMIN D-3) 1000 UNITS CAPS Take 1 capsule by mouth daily.     [provider]  clopidogrel (PLAVIX) 75 MG tablet TAKE 1 TABLET BY MOUTH  DAILY 12/31/16   Belva Crome, MD  cyclobenzaprine (FLEXERIL) 5 MG tablet Take 1 tablet (5 mg total) by mouth at bedtime as needed for muscle spasms. 01/04/16   Oswald Hillock, MD  doxazosin (CARDURA) 4 MG tablet Take 4 mg by mouth at bedtime.     [provider]  ezetimibe (ZETIA) 10 MG tablet TAKE 1 TABLET BY MOUTH  DAILY 12/31/16   Belva Crome, MD  fluticasone [facility]) 50 MCG/ACT nasal spray Place 2 sprays into both nostrils daily as needed for allergies or  rhinitis.     [provider]  furosemide (LASIX) 80 MG tablet TAKE 1 TABLET BY MOUTH 2  TIMES DAILY. MAY TAKE AN  ADDITIONAL 80MG  (1 TABLET)  AS NEEDED FOR EDEMA. 11/21/17   Belva Crome, MD  Insulin Glargine [facility] KWIKPEN) 100 UNIT/ML SOPN Inject 20 Units into the skin at bedtime. Reported on 02/18/2016     [provider]  isosorbide mononitrate (IMDUR) 60 MG 24 hr tablet Take 1 tablet (60 mg total) by mouth daily. 10/24/17   Belva Crome, MD  Metoprolol Succinate 200 MG CS24 Take 200 mg by mouth daily. 12/14/17   Belva Crome, MD  Multiple Vitamin (MULTIVITAMIN WITH MINERALS) TABS Take 1 tablet by mouth daily.    [provider]  NITROSTAT 0.4 MG SL tablet Place 1 tablet under the tongue every 5 minutes as needed for chest pain, max 3 doses, go to er if no relief 11/18/14   Belva Crome, MD  ondansetron (ZOFRAN) 8 MG tablet Take 8 mg by mouth every 8 (eight) hours as needed for nausea or vomiting.  03/30/17   [provider]  pantoprazole (PROTONIX) 40 MG tablet Take 40 mg by mouth daily.    [provider]  potassium chloride (K-DUR,KLOR-CON) 10 MEQ tablet TAKE 2 TABLETS BY MOUTH TWO TIMES DAILY 07/04/17   Belva Crome, MD  predniSONE (STERAPRED UNI-PAK 21 TAB) 10 MG (21) TBPK tablet Use as directed by package  insert. 12/09/17   [provider]  ramipril (ALTACE) 10 MG capsule Take 1 capsule (10 mg total) by mouth 2 (two) times daily. 06/06/17   Belva Crome, MD  Riboflavin 100 MG TABS Take 2 tablets (200 mg total) by mouth daily. 01/04/16   Oswald Hillock, MD  simvastatin (ZOCOR) 20 MG tablet TAKE 1 TABLET BY MOUTH AT  BEDTIME 03/28/17   Belva Crome, MD  sodium chloride (MURO 128) 5 % ophthalmic ointment Place 1 drop into both eyes at bedtime. For dry eyes    [provider]  traMADol (ULTRAM) 50 MG tablet Take 100 mg by mouth every 6 (six) hours as needed (MIGRAINES).    [provider]  venlafaxine (EFFEXOR) 100 MG tablet Take 100-200 mg by mouth See admin instructions. 200 mg in the morning then 100 mg at noon 04/12/11   [provider]    Family History Family History  Problem Relation Age of Onset  . Heart disease Mother   . Hypertension Mother   . Heart attack Mother   . Stroke Mother   . Heart disease Father   . Hypertension Father   . Diabetes Father   . Kidney failure Father   . Heart attack Father   . Stroke Father   . Heart disease Brother   . Diabetes Brother   . Kidney failure Brother   . Heart attack Brother   . Diabetes Paternal Grandmother     Social History Social History   Tobacco Use  . Smoking status: Never Smoker  . Smokeless tobacco: Never Used  Substance Use Topics  . Alcohol use: No    Alcohol/week: 0.0 oz  . Drug use: No     Allergies   Calcium channel blockers; Codeine; Lyrica [pregabalin]; Other; Ticlid [ticlopidine hcl]; Morphine and related; Tape; Digoxin and related; Metolazone; Myrbetriq [mirabegron]; Onglyza [saxagliptin]; Penicillins; Spironolactone; Sulfa antibiotics; Tetracyclines & related; Vicodin [hydrocodone-acetaminophen]; and Latex   Review of Systems Review of Systems  Constitutional: Negative.  HENT: Positive for facial swelling.   Respiratory: Negative.   Cardiovascular: Negative.     Gastrointestinal: Negative.   Musculoskeletal: Negative.   Skin: Negative.   Allergic/Immunologic: Positive for immunocompromised state.       Diabetic  Neurological: Negative.   Psychiatric/Behavioral: Negative.   All other systems reviewed and are negative.    Physical Exam Updated Vital Signs BP (!) 152/79 (BP Location: Right Arm)   Pulse 73   Resp 18   Ht 5\' 4"  (1.626 m)   Wt 75.8 kg (167 lb)   SpO2 97%   BMI 28.67 kg/m   Physical Exam  Constitutional: She appears well-developed and well-nourished. No distress.  HENT:  Head: Normocephalic and atraumatic.  Lips and bilateral cheeks are swollen.  Tongue is not swollen.  Oropharynx grossly normal.  Voice not hoarse  Eyes: Conjunctivae are normal. Pupils are equal, round, and reactive to light.  Neck: Neck supple. No tracheal deviation present. No thyromegaly present.  Cardiovascular: Normal rate and regular rhythm.  No murmur heard. Pulmonary/Chest: Effort normal and breath sounds normal.  Abdominal: Soft. Bowel sounds are normal. She exhibits no distension. There is no tenderness.  Musculoskeletal: Normal range of motion. She exhibits no edema or tenderness.  Neurological: She is alert. Coordination normal.  Skin: Skin is warm and dry. No rash noted.  Psychiatric: She has a normal mood and affect.  Nursing note and vitals reviewed.    ED Treatments / Results  Labs (all labs ordered are listed, but only abnormal results are displayed) Labs Reviewed  BASIC METABOLIC PANEL - Abnormal; Notable for the following components:      Result Value   Chloride 100 (*)    CO2 33 (*)    BUN 26 (*)    Creatinine, Ser 2.14 (*)    Calcium 8.6 (*)    GFR calc non Af Amer 23 (*)    GFR calc Af Amer 27 (*)    All other components within normal limits  CBC WITH DIFFERENTIAL/PLATELET - Abnormal; Notable for the following components:   WBC 11.2 (*)    Hemoglobin 11.6 (*)    HCT 34.2 (*)    All other components within normal  limits    EKG  EKG Interpretation None       Radiology No results found.  Procedures Procedures (including critical care time)  Medications Ordered in ED Medications  methylPREDNISolone sodium succinate (SOLU-MEDROL) 125 mg/2 mL injection 125 mg (125 mg Intravenous Given 12/18/17 1110)  EPINEPHrine (EPI-PEN) injection 0.3 mg (0.3 mg Intramuscular Given 12/18/17 1110)  diphenhydrAMINE (BENADRYL) injection 50 mg (50 mg Intravenous Given 12/18/17 1110)  famotidine (PEPCID) IVPB 20 mg premix (0 mg Intravenous Stopped 12/18/17 1146)    Results for orders placed or performed during the hospital encounter of 27/06/23  Basic metabolic panel  Result Value Ref Range   Sodium 142 135 - 145 mmol/L   Potassium 3.9 3.5 - 5.1 mmol/L   Chloride 100 (L) 101 - 111 mmol/L   CO2 33 (H) 22 - 32 mmol/L   Glucose, Bld 85 65 - 99 mg/dL   BUN 26 (H) 6 - 20 mg/dL   Creatinine, Ser 2.14 (H) 0.44 - 1.00 mg/dL   Calcium 8.6 (L) 8.9 - 10.3 mg/dL   GFR calc non Af Amer 23 (L) >60 mL/min   GFR calc Af Amer 27 (L) >60 mL/min   Anion gap 9 5 - 15  CBC with Differential/Platelet  Result Value Ref Range  WBC 11.2 (H) 4.0 - 10.5 K/uL   RBC 3.89 3.87 - 5.11 MIL/uL   Hemoglobin 11.6 (L) 12.0 - 15.0 g/dL   HCT 34.2 (L) 36.0 - 46.0 %   MCV 87.9 78.0 - 100.0 fL   MCH 29.8 26.0 - 34.0 pg   MCHC 33.9 30.0 - 36.0 g/dL   RDW 13.0 11.5 - 15.5 %   Platelets 204 150 - 400 K/uL   Neutrophils Relative % 56 %   Neutro Abs 6.3 1.7 - 7.7 K/uL   Lymphocytes Relative 34 %   Lymphs Abs 3.8 0.7 - 4.0 K/uL   Monocytes Relative 9 %   Monocytes Absolute 1.0 0.1 - 1.0 K/uL   Eosinophils Relative 1 %   Eosinophils Absolute 0.1 0.0 - 0.7 K/uL   Basophils Relative 0 %   Basophils Absolute 0.0 0.0 - 0.1 K/uL   Dg Chest 2 View  Result Date: 12/12/2017 CLINICAL DATA:  Chest pain EXAM: CHEST  2 VIEW COMPARISON:  July 29, 2016 FINDINGS: Stable AICD device. The heart, hila, mediastinum, lungs, and pleura are otherwise  unremarkable. IMPRESSION: No active cardiopulmonary disease. Electronically Signed   By: Dorise Bullion III M.D   On: 12/12/2017 21:15   Initial Impression / Assessment and Plan / ED Course  I have reviewed the triage vital signs and the nursing notes.  Pertinent labs & imaging results that were available during my care of the patient were reviewed by me and considered in my medical decision making (see chart for details).     Patient treated with Benadryl upon arrival.  At 2 PM she states her facial swelling is improving and she looks less swollen.  At 3:20 PM facial swelling continues to improve.  Her speech is clear.  And she is in no distress.  She feels ready for discharge. Patient suffering from angioedema most likely secondary to Altace, ACE inhibitor.  I have consulted with hospital pharmacist who feels that ACE inhibitor is the most likely culprit.  Plan stop Altace.  Follow-up with Dr. Tamala Julian, cardiologist.  Lab work is consistent with anemia and renal insufficiency which are both chronic Final Clinical Impressions(s) / ED Diagnoses  Diagnosis#1 angioedema #2 anemia #3 renal insufficiency Final diagnoses:  None    ED Discharge Orders    None       Orlie Dakin, MD 12/18/17 1527

## 2017-12-18 NOTE — ED Notes (Signed)
Called to triage for patient BBS CTA, no stridor noted.

## 2017-12-18 NOTE — ED Triage Notes (Addendum)
Allergic reaction immediately after eating nuts. Lips significantly swollen with difficulty speaking. [name]

## 2017-12-19 ENCOUNTER — Telehealth: Payer: Self-pay | Admitting: Interventional Cardiology

## 2017-12-19 MED ORDER — OLMESARTAN MEDOXOMIL 20 MG PO TABS: 20.0000 mg | ORAL_TABLET | Freq: Every day | ORAL | 3 refills | Status: DC

## 2017-12-19 NOTE — Telephone Encounter (Signed)
Will route to [facility] for assistance with medication replacement

## 2017-12-19 NOTE — Telephone Encounter (Signed)
Believe I answered requesting switch to [facility] - Losartan, Diovan, Olmisatran etc at moderate dose base on what is not on recall. May need Pharm D advice.

## 2017-12-19 NOTE — Telephone Encounter (Signed)
New message      Pt c/o swelling: STAT is pt has developed SOB within 24 hours  1) How much weight have you gained and in what time span? Started yesterday 12/18/17 morning   2) If swelling, where is the swelling located? In her face , she went to [facility] - they gave her benadryl and epi-pen , her throat got scratchy and she didn't have any swelling anywhere else   3) Are you currently taking a fluid pill? Yes    Are you currently SOB? No  4) Do you have a log of your daily weights (if so, list)? no  5) Have you gained 3 pounds in a day or 5 pounds in a week? no  Have you traveled recently? no  [facility] stopped her Altace they believe she had an allergic reaction to it , what are your suggestions

## 2017-12-19 NOTE — Telephone Encounter (Signed)
Spoke with pt and made her aware of recommendations.  Pt verbalized understanding and was appreciative for call.  

## 2017-12-19 NOTE — Telephone Encounter (Signed)
Pt seen at ER yesterday d/t anaphylaxis.  Pt had swelling in lips and face, itching at elbows and knees. Was given Epi and benadryl at hospital.  Pt states today she still just has some swelling on lower left side of face and top lip but much better than yesterday.  ER physician advised pt to stop Altace as it was felt to likely be the reason for the reaction.  Pt's BP at hospital was 152/79, 141/77 when leaving.  Advised pt I would send message to Dr. Tamala Julian for review and advisement on what medication he may want to start to replace Altace.

## 2017-12-19 NOTE — Telephone Encounter (Signed)
As of now olmesartan is not associated with recalls. Would recommend olmesartan 20mg  daily. In addition, losartan tends to have the most crossreactivity with Acei, thus I would avoid losartan if possible and Diovan is still hard to come by due to the recall.

## 2018-01-03 ENCOUNTER — Telehealth: Payer: Self-pay | Admitting: Interventional Cardiology

## 2018-01-03 NOTE — Telephone Encounter (Signed)
Returned pt's call. She wanted to let us know what her current BP readings have been since she had a medication change after her recent ER visit.  2/21 146/97(am) 150/93(midday) 143/90 (pm) 2/22 131/84 (am) 2/24 152/91 (am) 2/25 140/88 (am) 2/26 139/84 (am)  Pt stated she takes all her reading after her medication has been taken in the morning. She had been off prednisone for a week now.   She would like to schedule a f/up appointment with Dr Tamala Julian to make medication changes since her recent medication reaction.

## 2018-01-03 NOTE — Telephone Encounter (Signed)
New message      Pt c/o BP issue: STAT if pt c/o blurred vision, one-sided weakness or slurred speech  1. What are your last 5 BP readings?  146/97 150/93 143/90 131/84 152/91   2. Are you having any other symptoms (ex. Dizziness, headache, blurred vision, passed out)? Occasionally has lightheadedness and sometime having blurred vision   3. What is your BP issue? bp is still running high after the change in her medication

## 2018-01-04 NOTE — Telephone Encounter (Signed)
Spoke with pt and scheduled her to see Cecilie Kicks, NP tomorrow at 2pm.  Pt appreciative for call.

## 2018-01-05 ENCOUNTER — Other Ambulatory Visit: Payer: Self-pay | Admitting: Interventional Cardiology

## 2018-01-05 ENCOUNTER — Ambulatory Visit: Payer: Medicare HMO | Admitting: Cardiology

## 2018-01-05 MED ORDER — SIMVASTATIN 20 MG PO TABS: 20.0000 mg | ORAL_TABLET | Freq: Every day | ORAL | 3 refills | Status: DC

## 2018-01-05 MED ORDER — METOPROLOL SUCCINATE 200 MG PO CS24: 200.0000 mg | EXTENDED_RELEASE_CAPSULE | Freq: Every day | ORAL | 3 refills | Status: DC

## 2018-01-05 MED ORDER — CLOPIDOGREL BISULFATE 75 MG PO TABS: 75.0000 mg | ORAL_TABLET | Freq: Every day | ORAL | 3 refills | Status: DC

## 2018-01-05 MED ORDER — FUROSEMIDE 80 MG PO TABS: ORAL_TABLET | ORAL | 3 refills | Status: DC

## 2018-01-05 MED ORDER — EZETIMIBE 10 MG PO TABS: 10.0000 mg | ORAL_TABLET | Freq: Every day | ORAL | 3 refills | Status: DC

## 2018-01-05 MED ORDER — OLMESARTAN MEDOXOMIL 20 MG PO TABS: 20.0000 mg | ORAL_TABLET | Freq: Every day | ORAL | 3 refills | Status: DC

## 2018-01-05 MED ORDER — ISOSORBIDE MONONITRATE ER 60 MG PO TB24: 60.0000 mg | ORAL_TABLET | Freq: Every day | ORAL | 3 refills | Status: DC

## 2018-01-05 MED ORDER — POTASSIUM CHLORIDE CRYS ER 10 MEQ PO TBCR: EXTENDED_RELEASE_TABLET | ORAL | 3 refills | Status: DC

## 2018-01-12 MED ORDER — METOPROLOL SUCCINATE 200 MG PO CS24: 200.0000 mg | EXTENDED_RELEASE_CAPSULE | Freq: Every day | ORAL | 3 refills | Status: DC

## 2018-01-12 NOTE — Addendum Note (Signed)
Addended by: Derl Barrow on: 01/12/2018 10:13 AM   Modules accepted: Orders

## 2018-01-18 ENCOUNTER — Ambulatory Visit (INDEPENDENT_AMBULATORY_CARE_PROVIDER_SITE_OTHER): Payer: Medicare HMO | Admitting: Cardiology

## 2018-01-18 ENCOUNTER — Encounter: Payer: Self-pay | Admitting: Cardiology

## 2018-01-18 VITALS — BP 130/88 | HR 72 | Ht 64.0 in | Wt 173.0 lb

## 2018-01-18 DIAGNOSIS — N289 Disorder of kidney and ureter, unspecified: Secondary | ICD-10-CM | POA: Diagnosis not present

## 2018-01-18 DIAGNOSIS — I10 Essential (primary) hypertension: Secondary | ICD-10-CM | POA: Diagnosis not present

## 2018-01-18 DIAGNOSIS — E782 Mixed hyperlipidemia: Secondary | ICD-10-CM

## 2018-01-18 DIAGNOSIS — I25709 Atherosclerosis of coronary artery bypass graft(s), unspecified, with unspecified angina pectoris: Secondary | ICD-10-CM | POA: Diagnosis not present

## 2018-01-18 DIAGNOSIS — I5022 Chronic systolic (congestive) heart failure: Secondary | ICD-10-CM | POA: Diagnosis not present

## 2018-01-18 DIAGNOSIS — T783XXD Angioneurotic edema, subsequent encounter: Secondary | ICD-10-CM | POA: Diagnosis not present

## 2018-01-18 NOTE — Patient Instructions (Signed)
Medication Instructions: Your physician recommends that you continue on your current medications as directed. Please refer to the Current Medication list given to you today.  Labwork: Your physician has recommended that you have lab work today: BMET  Procedures/Testing: None Ordered  Follow-Up: Your physician wants you to follow-up in June with Dr. Tamala Julian.  If you need a refill on your cardiac medications before your next appointment, please call your pharmacy.

## 2018-01-18 NOTE — Progress Notes (Signed)
Cardiology Office Note   Date:  01/18/2018   ID:  [name], DOB 08-31-1951, MRN [id number]  PCP:  [name], MD  Cardiologist:  Dr. Tamala Julian    Chief Complaint  Patient presents with  . Hypertension      History of Present Illness: [name] is a 67 y.o. female who presents for ER follow up for angioedema presumed to ACE and BP check on new meds.   She has a history of  CAD with CABG 1998, CHF, BiV- Defibrillator, ischemic cardiomyopathy, mitral valve disease with moderate regurgitation, diabetes mellitus, and ischemic stroke history  last cath 2016 with multivessel native disease, but patent LIMA to LAD, patent VG to diag. And patent VG to PDA with retrograde filling of posterior lateral branch.   She had cardiopulmonary study in 2017 with no ventilatory limitation.  There was a significant chronotropic incompetence.  Echo in 12/2015 with EF up to 45-50% G2DD, moderate MR, mild TR  On last visit torpol XL was increased to 200 mg daily.  She had been placed on prednisone for back pain and BP was elevated.  .  Pt seen in ER 12/18/17 for face and lip swelling.  No difficulty with breathing or swallowing.  BP was 152/79 --treated with Benadryl and Epi pen.  This was felt to be due to Altace.  This is marked under allergies now.  Dr. Tamala Julian and Pharm D felt olmesartan 20 mg daily would be best ARB choice.  BP on the 26th was still mildly elevated.    Today BP is continuing to decrease.  She has no chest pain or SOB.  She feels much better.  Earlier this week BP 160/62 in L arm and 155/92 in Rt .  Her Cr in the ER was 2.14 higher than her usual. Will recheck today. Otherwise she is doing well.      Past Medical History:  Diagnosis Date  . AICD (automatic cardioverter/defibrillator) present    [employer]- Dr. Beckie Salts follows  . Anginal pain ([place])   . Anxiety   . Asthma   . Back pain    "pinched nerve-lower back" - Dr. Nelva Bush follows.  . Cervical dysplasia   . CHF  (congestive heart failure) ([place])   . Chronic kidney disease    Dr. Posey Pronto follows.  . Complication of anesthesia    "I wake up during surgeries" (02/14/2013)  . Coronary artery disease   . Depression   . Fibroid   . Function kidney decreased   . GERD (gastroesophageal reflux disease)   . Hepatitis    Hepatitis A -college yrs"water source exposure"  . Hiatal hernia   . History of shingles    2-3 yrs ago last out break "around waist"  . History of stomach ulcers   . Hypertension   . ICD (implantable cardiac defibrillator) in place   . Iron deficiency anemia   . Ischemic cardiomyopathy    status post biventricular ICD placed by DR Edumunds who used to see Dr Melvern Banker here to establish  cardiovascular care.  . Migraines   . MVP (mitral valve prolapse)    Antibiotics not required for procedures  . Myocardial infarction ([place])    "I've had 2; the others they were able to catch before completing" (02/14/2013)  . Pacemaker   . Pneumonia 1950's & 1985  . Shortness of breath    "lying down flat; at times w/exertion" (02/14/2013). 10-06-15 exertion only..  . Stroke [facility])    "  2 confirmed; 9 TIA's; results in dragging LLE; numbness in tip of tongue" (02/14/2013),10-06-15 right hand tends to be weaker when tired.  . Type II diabetes mellitus ([place])     Past Surgical History:  Procedure Laterality Date  . ABDOMINAL HYSTERECTOMY  1985   TAH   . BIV ICD GENERTAOR CHANGE OUT  06/2007; 04/2008   "2 lead initial placement, at [facility]; done at [facility], after developing CHF" (02/14/2013)  . BIV PACEMAKER GENERATOR CHANGE OUT N/A 01/29/2015   Procedure: BIV PACEMAKER GENERATOR CHANGE OUT;  Surgeon: Evans Lance, MD;  Location: [facility] CATH LAB;  Service: Cardiovascular;  Laterality: N/A;  . BREAST EXCISIONAL BIOPSY Left 01/2007; 06/2007; 03/2008   "benign" (02/14/2013)  . BREAST SURGERY    . CARDIAC CATHETERIZATION     "probably in the teens" (02/14/2013)  . CARDIAC DEFIBRILLATOR PLACEMENT    . COLONOSCOPY  ~ 2002  .  COLONOSCOPY WITH PROPOFOL N/A 10/07/2015   Procedure: COLONOSCOPY WITH PROPOFOL;  Surgeon: Juanita Craver, MD;  Location: [facility] ENDOSCOPY;  Service: Endoscopy;  Laterality: N/A;  . CORONARY ANGIOPLASTY WITH STENT PLACEMENT     "started out w/5; bypass corrected some; 1 stent since the bypass" (02/14/2013)  . CORONARY ARTERY BYPASS GRAFT  ` 1998   LIMA-LAD, SVG-D1, SVG-PDA  . [place] OF UTERUS  1975 X 2; 1976; 1977  . INSERT / REPLACE / REMOVE PACEMAKER     biventricular defibrillator--06/10/ 2009  . LEFT HEART CATHETERIZATION WITH CORONARY/GRAFT ANGIOGRAM N/A 01/03/2015   Procedure: LEFT HEART CATHETERIZATION WITH Beatrix Fetters;  Surgeon: Sinclair Grooms, MD;  Location: [facility] CATH LAB;  Service: Cardiovascular;  Laterality: N/A;  . SUPRAVENTRICULAR TACHYCARDIA ABLATION  06/2007  . TEE WITHOUT CARDIOVERSION  11/14/2012   Procedure: TRANSESOPHAGEAL ECHOCARDIOGRAM (TEE);  Surgeon: Candee Furbish, MD;  Location: [facility] ENDOSCOPY;  Service: Cardiovascular;  Laterality: N/A;     Current Outpatient Medications  Medication Sig Dispense Refill  . albuterol (PROVENTIL HFA;VENTOLIN HFA) 108 (90 BASE) MCG/ACT inhaler Inhale 2 puffs into the lungs every 4 (four) hours as needed for wheezing or shortness of breath.    . ALPRAZolam (XANAX) 0.5 MG tablet Take 0.5-1 tablets by mouth 3 (three) times daily. 1 mg in the morning then 0.5 mg at noon then 0.5 mg at bedtime    . Artificial Tear GEL Place 2 drops into both eyes daily as needed (dry eyes).     [name] aspirin 325 MG tablet Take 325 mg by mouth daily.    [name] BIOTIN PO Take by mouth daily.    . cetirizine (ZYRTEC) 10 MG tablet Take 10 mg by mouth daily.    . Cholecalciferol (VITAMIN D-3) 1000 UNITS CAPS Take 1 capsule by mouth daily.     . clopidogrel (PLAVIX) 75 MG tablet Take 1 tablet (75 mg total) by mouth daily. 90 tablet 3  . cyclobenzaprine (FLEXERIL) 5 MG tablet Take 1 tablet (5 mg total) by mouth at bedtime as needed for muscle spasms. 30  tablet 0  . doxazosin (CARDURA) 4 MG tablet Take 4 mg by mouth at bedtime.     [name] ezetimibe (ZETIA) 10 MG tablet Take 1 tablet (10 mg total) by mouth daily. 90 tablet 3  . fluticasone (FLONASE) 50 MCG/ACT nasal spray Place 2 sprays into both nostrils daily as needed for allergies or rhinitis.     . furosemide (LASIX) 80 MG tablet TAKE 1 TABLET BY MOUTH 2  TIMES DAILY. MAY TAKE AN  ADDITIONAL 80MG  (1 TABLET)  AS NEEDED FOR EDEMA. (Patient taking differently: 160 mg daily. TAKE 1 TABLET BY MOUTH 2  TIMES DAILY. MAY TAKE AN  ADDITIONAL 80MG  (1 TABLET)  AS NEEDED FOR EDEMA.) 270 tablet 3  . insulin glargine (LANTUS) 100 UNIT/ML injection Inject into the skin at bedtime.    . isosorbide mononitrate (IMDUR) 60 MG 24 hr tablet Take 1 tablet (60 mg total) by mouth daily. 90 tablet 3  . Metoprolol Succinate 200 MG CS24 Take 200 mg by mouth daily. 90 capsule 3  . Multiple Vitamin (MULTIVITAMIN WITH MINERALS) TABS Take 1 tablet by mouth daily.    [name] NITROSTAT 0.4 MG SL tablet Place 1 tablet under the tongue every 5 minutes as needed for chest pain, max 3 doses, go to er if no relief 25 tablet 2  . olmesartan (BENICAR) 20 MG tablet Take 1 tablet (20 mg total) by mouth daily. 90 tablet 3  . ondansetron (ZOFRAN) 8 MG tablet Take 8 mg by mouth every 8 (eight) hours as needed for nausea or vomiting.     . pantoprazole (PROTONIX) 40 MG tablet Take 40 mg by mouth daily.    . potassium chloride (K-DUR,KLOR-CON) 10 MEQ tablet TAKE 2 TABLETS BY MOUTH TWO TIMES DAILY 360 tablet 3  . Riboflavin 100 MG TABS Take 2 tablets (200 mg total) by mouth daily. 60 tablet 1  . simvastatin (ZOCOR) 20 MG tablet Take 1 tablet (20 mg total) by mouth at bedtime. 90 tablet 3  . sodium chloride (MURO 128) 5 % ophthalmic ointment Place 1 drop into both eyes at bedtime. For dry eyes    . traMADol (ULTRAM) 50 MG tablet Take 100 mg by mouth every 6 (six) hours as needed (MIGRAINES).    [name] venlafaxine (EFFEXOR) 100 MG tablet Take 100-200 mg by  mouth See admin instructions. 200 mg in the morning then 100 mg at noon    . vitamin A 7500 UNIT capsule Take 2,400 Units by mouth daily.     No current facility-administered medications for this visit.     Allergies:   Calcium channel blockers; Codeine; Lyrica [pregabalin]; Other; Ramipril; Ticlid [ticlopidine hcl]; Morphine and related; Tape; Digoxin and related; Metolazone; Myrbetriq [mirabegron]; Onglyza [saxagliptin]; Penicillins; Spironolactone; Sulfa antibiotics; Tetracyclines & related; Vicodin [hydrocodone-acetaminophen]; and Latex    Social History:  The patient  reports that  has never smoked. she has never used smokeless tobacco. She reports that she does not drink alcohol or use drugs.   Family History:  The patient's family history includes Diabetes in her brother, father, and paternal grandmother; Heart attack in her brother, father, and mother; Heart disease in her brother, father, and mother; Hypertension in her father and mother; Kidney failure in her brother and father; Stroke in her father and mother.    ROS:  General:no colds or fevers, some weight gain Skin:no rashes or ulcers HEENT:no blurred vision, no congestion CV:see HPI PUL:see HPI GI:no diarrhea constipation or melena, no indigestion GU:no hematuria, no dysuria MS:no joint pain, no claudication Neuro:no syncope, no lightheadedness Endo:+ diabetes, no thyroid disease  Wt Readings from Last 3 Encounters:  01/18/18 173 lb (78.5 kg)  12/18/17 167 lb (75.8 kg)  12/14/17 171 lb 6.4 oz (77.7 kg)     PHYSICAL EXAM: VS:  BP 130/88 (BP Location: Left Arm, Patient Position: Sitting, Cuff Size: Normal)   Pulse 72   Ht 5\' 4"  (1.626 m)   Wt 173 lb (78.5 kg)   SpO2 98%   BMI 29.70 kg/m  ,  BMI Body mass index is 29.7 kg/m. General:Pleasant affect, NAD Skin:Warm and dry, brisk capillary refill HEENT:normocephalic, sclera clear, mucus membranes moist Neck:supple, no JVD, no bruits  Heart:S1S2 RRR without murmur,  gallup, rub or click Lungs:clear without rales, rhonchi, or wheezes EML:JQGB, non tender, + BS, do not palpate liver spleen or masses Ext:no lower ext edema, 2+ pedal pulses, 2+ radial pulses Neuro:alert and oriented, MAE, follows commands, + facial symmetry    EKG:  EKG is NOT ordered today. EKG in ER SR with V pacing.   Recent Labs: 12/18/2017: BUN 26; Creatinine, Ser 2.14; Hemoglobin 11.6; Platelets 204; Potassium 3.9; Sodium 142    Lipid Panel    Component Value Date/Time   CHOL 133 01/02/2016 0730   TRIG 131 01/02/2016 0730   HDL 41 01/02/2016 0730   CHOLHDL 3.2 01/02/2016 0730   VLDL 26 01/02/2016 0730   LDLCALC 66 01/02/2016 0730       Other studies Reviewed: Additional studies/ records that were reviewed today include: . Echo 01/02/16  Study Conclusions  - Left ventricle: The cavity size was normal. Systolic function was   mildly reduced. The estimated ejection fraction was in the range   of 45% to 50%. Diffuse hypokinesis. Features are consistent with   a pseudonormal left ventricular filling pattern, with concomitant   abnormal relaxation and increased filling pressure (grade 2   diastolic dysfunction). - Mitral valve: There was moderate regurgitation. - Left atrium: The atrium was moderately dilated. - Right ventricle: Poorly visualized. - Tricuspid valve: There was mild regurgitation.   ASSESSMENT AND PLAN:  1. Angioedema from ACE now resolved, tolerating ARB  2.  HTN has been elevated with recent use of steroids for back pain and then stopping ACE.  BP is improved from earlier in week.  Discussed with Dr. Tamala Julian and will leave BP where it is , she will keep record and follow up with Dr. Tamala Julian in June.  If BP increases she will contact - possible increase cardura.  3.  CAD with CABG , patent grafts on last cath, stable. Continue plavix and asa. She is on higher dose of ASA at 325, will leave for now until BP is controlled.   4.  Chronic systolic HF,  euvolemic today  5.  HLD continue statin  6.  Renal insuff. With elevated Cr in 12/2017 will recheck today      Current medicines are reviewed with the patient today.  The patient Has no concerns regarding medicines.  The following changes have been made:  See above Labs/ tests ordered today include:see above  Disposition:   FU:  see above  Signed, Cecilie Kicks, NP  01/18/2018 6:33 PM    [facility] [place], [place], [place], [place] Phone: [phone]; Fax: [phone]

## 2018-01-19 LAB — BASIC METABOLIC PANEL
BUN/Creatinine Ratio: 10 — ABNORMAL LOW (ref 12–28)
BUN: 19 mg/dL (ref 8–27)
CALCIUM: 9 mg/dL (ref 8.7–10.3)
CHLORIDE: 100 mmol/L (ref 96–106)
CO2: 27 mmol/L (ref 20–29)
Creatinine, Ser: 1.89 mg/dL — ABNORMAL HIGH (ref 0.57–1.00)
GFR calc non Af Amer: 28 mL/min/{1.73_m2} — ABNORMAL LOW (ref >59)
GFR, EST AFRICAN AMERICAN: 32 mL/min/{1.73_m2} — AB (ref >59)
Glucose: 146 mg/dL — ABNORMAL HIGH (ref 65–99)
Potassium: 4.5 mmol/L (ref 3.5–5.2)
Sodium: 144 mmol/L (ref 134–144)

## 2018-01-25 ENCOUNTER — Other Ambulatory Visit: Payer: Self-pay | Admitting: Interventional Cardiology

## 2018-01-26 MED ORDER — METOPROLOL SUCCINATE ER 100 MG PO TB24: 200.0000 mg | ORAL_TABLET | Freq: Every day | ORAL | 2 refills | Status: DC

## 2018-01-26 NOTE — Telephone Encounter (Signed)
[name] is a brand name of Metoprolol Succinate.  Pt just needs generic Metoprolol Succinate 200mg  QD.  Spoke with pt and she states she was given the tablets previously, not sprinkles.  Advised pt I was going to send in a prescription for 100mg  tablets and she would need to take 2 to get her full 200mg  dose.  Advised pt pharmacy was asking for an alternative d/t unable to get in 200mg  tablets.  Pt verbalized understanding and was in agreement with this plan.

## 2018-01-26 NOTE — Telephone Encounter (Signed)
Pt's pharmacy is requesting a alternative medication for metoprolol. Pharmacy is suggesting Kapspargro sprinkle 200 mg tablet. Pharmacy states that unable to get metoprolol. Please address.

## 2018-02-01 ENCOUNTER — Ambulatory Visit: Payer: Medicare HMO | Admitting: Cardiology

## 2018-02-09 ENCOUNTER — Ambulatory Visit (INDEPENDENT_AMBULATORY_CARE_PROVIDER_SITE_OTHER): Payer: Medicare HMO | Admitting: *Deleted

## 2018-02-09 DIAGNOSIS — I255 Ischemic cardiomyopathy: Secondary | ICD-10-CM

## 2018-02-09 NOTE — Progress Notes (Signed)
Remote ICD transmission.   

## 2018-02-15 ENCOUNTER — Encounter: Payer: Self-pay | Admitting: Cardiology

## 2018-02-28 LAB — CUP PACEART REMOTE DEVICE CHECK
Battery Remaining Longevity: 77 mo
Brady Statistic AP VP Percent: 0.08 %
Brady Statistic RA Percent Paced: 0.1 %
Brady Statistic RV Percent Paced: 0.54 %
HighPow Impedance: 39 Ohm
HighPow Impedance: 47 Ohm
Implantable Lead Location: 753859
Implantable Lead Location: 753860
Implantable Lead Model: 4194
Implantable Lead Model: 5076
Implantable Lead Model: 6947
Implantable Pulse Generator Implant Date: 20160323
Lead Channel Impedance Value: 342 Ohm
Lead Channel Impedance Value: 418 Ohm
Lead Channel Impedance Value: 703 Ohm
Lead Channel Pacing Threshold Amplitude: 0.625 V
Lead Channel Pacing Threshold Amplitude: 0.875 V
Lead Channel Sensing Intrinsic Amplitude: 2.375 mV
Lead Channel Sensing Intrinsic Amplitude: 2.875 mV
Lead Channel Sensing Intrinsic Amplitude: 2.875 mV
Lead Channel Setting Pacing Amplitude: 2 V
Lead Channel Setting Pacing Amplitude: 2.25 V
Lead Channel Setting Pacing Pulse Width: 0.4 ms
Lead Channel Setting Sensing Sensitivity: 0.3 mV
MDC IDC LEAD IMPLANT DT: 20090610
MDC IDC LEAD IMPLANT DT: 20090610
MDC IDC LEAD IMPLANT DT: 20090610
MDC IDC LEAD LOCATION: 753858
MDC IDC MSMT BATTERY VOLTAGE: 2.98 V
MDC IDC MSMT LEADCHNL LV IMPEDANCE VALUE: 285 Ohm
MDC IDC MSMT LEADCHNL LV IMPEDANCE VALUE: 551 Ohm
MDC IDC MSMT LEADCHNL LV PACING THRESHOLD AMPLITUDE: 1.125 V
MDC IDC MSMT LEADCHNL LV PACING THRESHOLD PULSEWIDTH: 0.4 ms
MDC IDC MSMT LEADCHNL RA IMPEDANCE VALUE: 456 Ohm
MDC IDC MSMT LEADCHNL RA PACING THRESHOLD PULSEWIDTH: 0.4 ms
MDC IDC MSMT LEADCHNL RV PACING THRESHOLD PULSEWIDTH: 0.4 ms
MDC IDC MSMT LEADCHNL RV SENSING INTR AMPL: 2.375 mV
MDC IDC SESS DTM: 20190404052403
MDC IDC SET LEADCHNL LV PACING PULSEWIDTH: 0.4 ms
MDC IDC SET LEADCHNL RA PACING AMPLITUDE: 1.5 V
MDC IDC STAT BRADY AP VS PERCENT: 0.02 %
MDC IDC STAT BRADY AS VP PERCENT: 98.51 %
MDC IDC STAT BRADY AS VS PERCENT: 1.4 %

## 2018-03-21 DIAGNOSIS — N189 Chronic kidney disease, unspecified: Secondary | ICD-10-CM | POA: Diagnosis not present

## 2018-03-21 DIAGNOSIS — N183 Chronic kidney disease, stage 3 (moderate): Secondary | ICD-10-CM | POA: Diagnosis not present

## 2018-03-21 DIAGNOSIS — N2581 Secondary hyperparathyroidism of renal origin: Secondary | ICD-10-CM | POA: Diagnosis not present

## 2018-03-22 DIAGNOSIS — D631 Anemia in chronic kidney disease: Secondary | ICD-10-CM | POA: Diagnosis not present

## 2018-03-22 DIAGNOSIS — I129 Hypertensive chronic kidney disease with stage 1 through stage 4 chronic kidney disease, or unspecified chronic kidney disease: Secondary | ICD-10-CM | POA: Diagnosis not present

## 2018-03-22 DIAGNOSIS — N183 Chronic kidney disease, stage 3 (moderate): Secondary | ICD-10-CM | POA: Diagnosis not present

## 2018-03-22 DIAGNOSIS — N2581 Secondary hyperparathyroidism of renal origin: Secondary | ICD-10-CM | POA: Diagnosis not present

## 2018-03-27 DIAGNOSIS — I5022 Chronic systolic (congestive) heart failure: Secondary | ICD-10-CM | POA: Diagnosis not present

## 2018-03-27 DIAGNOSIS — Z1389 Encounter for screening for other disorder: Secondary | ICD-10-CM | POA: Diagnosis not present

## 2018-03-27 DIAGNOSIS — E1143 Type 2 diabetes mellitus with diabetic autonomic (poly)neuropathy: Secondary | ICD-10-CM | POA: Diagnosis not present

## 2018-03-27 DIAGNOSIS — N183 Chronic kidney disease, stage 3 (moderate): Secondary | ICD-10-CM | POA: Diagnosis not present

## 2018-03-27 DIAGNOSIS — E1121 Type 2 diabetes mellitus with diabetic nephropathy: Secondary | ICD-10-CM | POA: Diagnosis not present

## 2018-03-27 DIAGNOSIS — Z0000 Encounter for general adult medical examination without abnormal findings: Secondary | ICD-10-CM | POA: Diagnosis not present

## 2018-03-27 DIAGNOSIS — I130 Hypertensive heart and chronic kidney disease with heart failure and stage 1 through stage 4 chronic kidney disease, or unspecified chronic kidney disease: Secondary | ICD-10-CM | POA: Diagnosis not present

## 2018-03-27 DIAGNOSIS — I10 Essential (primary) hypertension: Secondary | ICD-10-CM | POA: Diagnosis not present

## 2018-03-27 DIAGNOSIS — I2510 Atherosclerotic heart disease of native coronary artery without angina pectoris: Secondary | ICD-10-CM | POA: Diagnosis not present

## 2018-03-27 DIAGNOSIS — E7800 Pure hypercholesterolemia, unspecified: Secondary | ICD-10-CM | POA: Diagnosis not present

## 2018-03-27 DIAGNOSIS — E1122 Type 2 diabetes mellitus with diabetic chronic kidney disease: Secondary | ICD-10-CM | POA: Diagnosis not present

## 2018-04-07 DIAGNOSIS — E1121 Type 2 diabetes mellitus with diabetic nephropathy: Secondary | ICD-10-CM | POA: Diagnosis not present

## 2018-05-05 ENCOUNTER — Ambulatory Visit: Payer: Medicare HMO | Admitting: Interventional Cardiology

## 2018-05-05 ENCOUNTER — Encounter: Payer: Self-pay | Admitting: Interventional Cardiology

## 2018-05-05 VITALS — BP 146/86 | HR 67 | Ht 63.5 in | Wt 169.4 lb

## 2018-05-05 DIAGNOSIS — G459 Transient cerebral ischemic attack, unspecified: Secondary | ICD-10-CM

## 2018-05-05 DIAGNOSIS — I25709 Atherosclerosis of coronary artery bypass graft(s), unspecified, with unspecified angina pectoris: Secondary | ICD-10-CM | POA: Diagnosis not present

## 2018-05-05 DIAGNOSIS — Z95810 Presence of automatic (implantable) cardiac defibrillator: Secondary | ICD-10-CM | POA: Diagnosis not present

## 2018-05-05 DIAGNOSIS — I5032 Chronic diastolic (congestive) heart failure: Secondary | ICD-10-CM

## 2018-05-05 DIAGNOSIS — I471 Supraventricular tachycardia: Secondary | ICD-10-CM | POA: Diagnosis not present

## 2018-05-05 DIAGNOSIS — I255 Ischemic cardiomyopathy: Secondary | ICD-10-CM

## 2018-05-05 DIAGNOSIS — I10 Essential (primary) hypertension: Secondary | ICD-10-CM | POA: Diagnosis not present

## 2018-05-05 DIAGNOSIS — N183 Chronic kidney disease, stage 3 unspecified: Secondary | ICD-10-CM

## 2018-05-05 DIAGNOSIS — E7849 Other hyperlipidemia: Secondary | ICD-10-CM | POA: Diagnosis not present

## 2018-05-05 NOTE — Patient Instructions (Signed)
Medication Instructions:  Your physician recommends that you continue on your current medications as directed. Please refer to the Current Medication list given to you today.  Labwork: None  Testing/Procedures: None  Follow-Up: Your physician recommends that you schedule a follow-up appointment in: 4-6 months with Dr. Tamala Julian.    Any Other Special Instructions Will Be Listed Below (If Applicable).     If you need a refill on your cardiac medications before your next appointment, please call your pharmacy.

## 2018-05-05 NOTE — Progress Notes (Signed)
Cardiology Office Note    Date:  05/05/2018   ID:  Emily, [name] 1951-10-14, MRN [id number]  PCP:  [name], MD  Cardiologist: Sinclair Grooms, MD   Chief Complaint  Patient presents with  . Coronary Artery Disease  . Congestive Heart Failure    History of Present Illness:  [name] is a 67 y.o. female  presents for CAD, CHF, Defibrillator, ischemic cardiomyopathy, mitral valve disease with moderate regurgitation, diabetes mellitus, and ischemic stroke history.  Recent episode of angioedema.  Doing well.  Not having any aerobic activity.  No orthopnea, PND, but occasionally has lower extremity edema.  Has sharp shooting pains near the site of her device.  This discomfort is not precipitated by physical activity.  No AICD discharges.  Overall she feels well.   Past Medical History:  Diagnosis Date  . AICD (automatic cardioverter/defibrillator) present    [employer]- Dr. Beckie Salts follows  . Anginal pain ([place])   . Anxiety   . Asthma   . Asthma   . Back pain    "pinched nerve-lower back" - Dr. Nelva Bush follows.  . Biventricular implantable cardioverter-defibrillator in situ 06/18/2011  . Cerebral infarction ([place]) 11/11/2012  . Cervical dysplasia   . CHF (congestive heart failure) ([place])   . Chronic diastolic heart failure ([place]) 03/22/2016  . Chronic kidney disease    Dr. Posey Pronto follows.  . Chronic renal insufficiency, stage III (moderate) ([place]) 11/11/2012  . Complication of anesthesia    "I wake up during surgeries" (02/14/2013)  . Coronary artery disease   . Coronary artery disease involving coronary bypass graft of native heart with angina pectoris ([place]) 06/18/2011  . Depression   . DM (diabetes mellitus) ([place]) 11/11/2012  . Fibroid   . Function kidney decreased   . GERD (gastroesophageal reflux disease)   . Hemiplegia, unspecified, affecting nondominant side 11/11/2012  . Hepatitis    Hepatitis A -college yrs"water source exposure"  . Hiatal hernia   .  History of shingles    2-3 yrs ago last out break "around waist"  . History of stomach ulcers   . HTN (hypertension) 11/11/2012  . Hyperlipidemia 07/30/2016  . Hypertension   . ICD (implantable cardiac defibrillator) in place   . Iron deficiency anemia   . Ischemic cardiomyopathy    status post biventricular ICD placed by DR Edumunds who used to see Dr Melvern Banker here to establish  cardiovascular care.  . Migraines   . MVP (mitral valve prolapse)    Antibiotics not required for procedures  . Myocardial infarction ([place])    "I've had 2; the others they were able to catch before completing" (02/14/2013)  . Pacemaker   . Paroxysmal SVT (supraventricular tachycardia) ([place]) 06/18/2011  . Pneumonia 1950's & 1985  . Shortness of breath    "lying down flat; at times w/exertion" (02/14/2013). 10-06-15 exertion only..  . Stroke [facility])    "2 confirmed; 9 TIA's; results in dragging LLE; numbness in tip of tongue" (02/14/2013),10-06-15 right hand tends to be weaker when tired.  [name] TIA (transient ischemic attack) 11/11/2012  . Type II diabetes mellitus ([place])     Past Surgical History:  Procedure Laterality Date  . ABDOMINAL HYSTERECTOMY  1985   TAH   . BIV ICD GENERTAOR CHANGE OUT  06/2007; 04/2008   "2 lead initial placement, at [facility]; done at [facility], after developing CHF" (02/14/2013)  . BIV PACEMAKER GENERATOR CHANGE OUT N/A 01/29/2015   Procedure: BIV PACEMAKER GENERATOR CHANGE  OUT;  Surgeon: Evans Lance, MD;  Location[facility] CATH LAB;  Service: Cardiovascular;  Laterality: N/A;  . BREAST EXCISIONAL BIOPSY Left 01/2007; 06/2007; 03/2008   "benign" (02/14/2013)  . BREAST SURGERY    . CARDIAC CATHETERIZATION     "probably in the teens" (02/14/2013)  . CARDIAC DEFIBRILLATOR PLACEMENT    . COLONOSCOPY  ~ 2002  . COLONOSCOPY WITH PROPOFOL N/A 10/07/2015   Procedure: COLONOSCOPY WITH PROPOFOL;  Surgeon: Juanita Craver, MD;  Location: [facility] ENDOSCOPY;  Service: Endoscopy;  Laterality: N/A;  . CORONARY ANGIOPLASTY WITH STENT  PLACEMENT     "started out w/5; bypass corrected some; 1 stent since the bypass" (02/14/2013)  . CORONARY ARTERY BYPASS GRAFT  ` 1998   LIMA-LAD, SVG-D1, SVG-PDA  . [place] OF UTERUS  1975 X 2; 1976; 1977  . INSERT / REPLACE / REMOVE PACEMAKER     biventricular defibrillator--06/10/ 2009  . LEFT HEART CATHETERIZATION WITH CORONARY/GRAFT ANGIOGRAM N/A 01/03/2015   Procedure: LEFT HEART CATHETERIZATION WITH Beatrix Fetters;  Surgeon: Sinclair Grooms, MD;  [facility];  Service: Cardiovascular;  Laterality: N/A;  . SUPRAVENTRICULAR TACHYCARDIA ABLATION  06/2007  . TEE WITHOUT CARDIOVERSION  11/14/2012   Procedure: TRANSESOPHAGEAL ECHOCARDIOGRAM (TEE);  Surgeon: Candee Furbish, MD;  Location: [facility] ENDOSCOPY;  Service: Cardiovascular;  Laterality: N/A;    Current Medications: Outpatient Medications Prior to Visit  Medication Sig Dispense Refill  . albuterol (PROVENTIL HFA;VENTOLIN HFA) 108 (90 BASE) MCG/ACT inhaler Inhale 2 puffs into the lungs every 4 (four) hours as needed for wheezing or shortness of breath.    . ALPRAZolam (XANAX) 0.5 MG tablet Take 0.5-1 tablets by mouth 3 (three) times daily. 1 mg in the morning then 0.5 mg at noon then 0.5 mg at bedtime    . Artificial Tear GEL Place 2 drops into both eyes daily as needed (dry eyes).     [name] aspirin 325 MG tablet Take 325 mg by mouth daily.    [name] BIOTIN PO Take by mouth daily.    . cetirizine (ZYRTEC) 10 MG tablet Take 10 mg by mouth daily.    . Cholecalciferol (VITAMIN D-3) 1000 UNITS CAPS Take 1 capsule by mouth daily.     . clopidogrel (PLAVIX) 75 MG tablet Take 1 tablet (75 mg total) by mouth daily. 90 tablet 3  . cyclobenzaprine (FLEXERIL) 5 MG tablet Take 1 tablet (5 mg total) by mouth at bedtime as needed for muscle spasms. 30 tablet 0  . doxazosin (CARDURA) 4 MG tablet Take 4 mg by mouth at bedtime.     [name] ezetimibe (ZETIA) 10 MG tablet Take 1 tablet (10 mg total) by mouth daily. 90 tablet 3  . fluticasone  (FLONASE) 50 MCG/ACT nasal spray Place 2 sprays into both nostrils daily as needed for allergies or rhinitis.     . furosemide (LASIX) 80 MG tablet Take two (2) tablets (160 mg) by mouth daily. Take one (1) additional tablet (80 mg) by mouth daily as needed for swelling.    . insulin glargine (LANTUS) 100 UNIT/ML injection Inject 18 Units into the skin at bedtime.     . isosorbide mononitrate (IMDUR) 60 MG 24 hr tablet Take 1 tablet (60 mg total) by mouth daily. 90 tablet 3  . metoprolol (TOPROL-XL) 200 MG 24 hr tablet Take 200 mg by mouth daily.    . Multiple Vitamin (MULTIVITAMIN WITH MINERALS) TABS Take 1 tablet by mouth daily.    [name] NITROSTAT 0.4 MG SL  tablet Place 1 tablet under the tongue every 5 minutes as needed for chest pain, max 3 doses, go to er if no relief 25 tablet 2  . olmesartan (BENICAR) 20 MG tablet Take 1 tablet (20 mg total) by mouth daily. 90 tablet 3  . ondansetron (ZOFRAN) 8 MG tablet Take 8 mg by mouth every 8 (eight) hours as needed for nausea or vomiting.     . pantoprazole (PROTONIX) 40 MG tablet Take 40 mg by mouth daily.    . potassium chloride (K-DUR,KLOR-CON) 10 MEQ tablet TAKE 2 TABLETS BY MOUTH TWO TIMES DAILY 360 tablet 3  . Riboflavin 100 MG TABS Take 2 tablets (200 mg total) by mouth daily. 60 tablet 1  . simvastatin (ZOCOR) 20 MG tablet Take 1 tablet (20 mg total) by mouth at bedtime. 90 tablet 3  . sodium chloride (MURO 128) 5 % ophthalmic ointment Place 1 drop into both eyes at bedtime. For dry eyes    . traMADol (ULTRAM) 50 MG tablet Take 100 mg by mouth every 6 (six) hours as needed (MIGRAINES).    [name] venlafaxine (EFFEXOR) 100 MG tablet Take 100-200 mg by mouth See admin instructions. 200 mg in the morning then 100 mg at noon    . vitamin A 7500 UNIT capsule Take 2,400 Units by mouth daily.    . furosemide (LASIX) 80 MG tablet TAKE 1 TABLET BY MOUTH 2  TIMES DAILY. MAY TAKE AN  ADDITIONAL 80MG  (1 TABLET)  AS NEEDED FOR EDEMA. (Patient not taking: Reported on  05/05/2018) 270 tablet 3  . metoprolol succinate (TOPROL-XL) 100 MG 24 hr tablet Take 2 tablets (200 mg total) by mouth daily. Take with or immediately following a meal. (Patient not taking: Reported on 05/05/2018) 180 tablet 2   No facility-administered medications prior to visit.      Allergies:   Calcium channel blockers; Codeine; Lyrica [pregabalin]; Other; Ramipril; Ticlid [ticlopidine hcl]; Morphine and related; Tape; Digoxin and related; Metolazone; Myrbetriq [mirabegron]; Onglyza [saxagliptin]; Penicillins; Spironolactone; Sulfa antibiotics; Tetracyclines & related; Vicodin [hydrocodone-acetaminophen]; and Latex   Social History   Socioeconomic History  . Marital status: Married    Spouse name: [name]  . Number of children: 1  . Years of education: MA  . Highest education level: Not on file  Occupational History    Employer: UNEMPLOYED    Comment: Disablity  Social Needs  . Financial resource strain: Not on file  . Food insecurity:    Worry: Not on file    Inability: Not on file  . Transportation needs:    Medical: Not on file    Non-medical: Not on file  Tobacco Use  . Smoking status: Never Smoker  . Smokeless tobacco: Never Used  Substance and Sexual Activity  . Alcohol use: No    Alcohol/week: 0.0 oz  . Drug use: No  . Sexual activity: Yes    Birth control/protection: Surgical  Lifestyle  . Physical activity:    Days per week: Not on file    Minutes per session: Not on file  . Stress: Not on file  Relationships  . Social connections:    Talks on phone: Not on file    Gets together: Not on file    Attends religious service: Not on file    Active member of club or organization: Not on file    Attends meetings of clubs or organizations: Not on file    Relationship status: Not on file  Other Topics Concern  . Not  on file  Social History Narrative   Patient lives at home with spouse.     Daughter name is Tourist information centre manager.   Caffeine Use: none     Family History:   The patient's family history includes Diabetes in her brother, father, and paternal grandmother; Heart attack in her brother, father, and mother; Heart disease in her brother, father, and mother; Hypertension in her father and mother; Kidney failure in her brother and father; Stroke in her father and mother.   ROS:   Please see the history of present illness.    Depression, constipation, anxiety, leg pain, and excessive fatigue. All other systems reviewed and are negative.   PHYSICAL EXAM:   VS:  BP (!) 146/86   Pulse 67   Ht 5' 3.5" (1.613 m)   Wt 169 lb 6.4 oz (76.8 kg)   BMI 29.54 kg/m    GEN: Well nourished, well developed, in no acute distress  HEENT: normal  Neck: no JVD, carotid bruits, or masses Cardiac: RRR; no murmurs, rubs, or gallops,no edema  Respiratory:  clear to auscultation bilaterally, normal work of breathing GI: soft, nontender, nondistended, + BS MS: no deformity or atrophy  Skin: warm and dry, no rash Neuro:  Alert and Oriented x 3, Strength and sensation are intact Psych: euthymic mood, full affect  Wt Readings from Last 3 Encounters:  05/05/18 169 lb 6.4 oz (76.8 kg)  01/18/18 173 lb (78.5 kg)  12/18/17 167 lb (75.8 kg)      Studies/Labs Reviewed:   EKG:  EKG  none  Recent Labs: 12/18/2017: Hemoglobin 11.6; Platelets 204 01/18/2018: BUN 19; Creatinine, Ser 1.89; Potassium 4.5; Sodium 144   Lipid Panel    Component Value Date/Time   CHOL 133 01/02/2016 0730   TRIG 131 01/02/2016 0730   HDL 41 01/02/2016 0730   CHOLHDL 3.2 01/02/2016 0730   VLDL 26 01/02/2016 0730   LDLCALC 66 01/02/2016 0730    Additional studies/ records that were reviewed today include:  none    ASSESSMENT:    1. Ischemic cardiomyopathy   2. Essential hypertension   3. Other hyperlipidemia   4. TIA (transient ischemic attack)   5. Paroxysmal SVT (supraventricular tachycardia) (H[facility])   6. Coronary artery disease involving coronary bypass graft of native heart with  angina pectoris ([place])   7. Chronic renal insufficiency, stage III (moderate) ([facility])   8. Chronic diastolic heart failure ([place])   9. Biventricular implantable cardioverter-defibrillator in situ      PLAN:  In order of problems listed above:  1. I have recommended aerobic activity.  Medical regimen should be continued as listed.  We encouraged phase 3 cardiac rehab and then independent aerobic activity with up to 120 to 150 minutes of activity. 2. Adequate control target 130/80 3. LDL less than 70 is target 4. Not addressed 5. No significant recurrences 6. Call if change in chest pain episodes.  Clinical follow-up in 4 to 6 months.  Try to engage in more aerobic activity.  Started phase 3 cardiac rehab.  Medication Adjustments/Labs and Tests Ordered: Current medicines are reviewed at length with the patient today.  Concerns regarding medicines are outlined above.  Medication changes, Labs and Tests ordered today are listed in the Patient Instructions below. Patient Instructions  Medication Instructions:  Your physician recommends that you continue on your current medications as directed. Please refer to the Current Medication list given to you today.  Labwork: None  Testing/Procedures: None  Follow-Up: Your physician recommends that  you schedule a follow-up appointment in: 4-6 months with Dr. Tamala Julian.    Any Other Special Instructions Will Be Listed Below (If Applicable).     If you need a refill on your cardiac medications before your next appointment, please call your pharmacy.      Signed, Sinclair Grooms, MD  05/05/2018 4:12 PM    [facility] [address], [place], [place]  [address] Phone: [phone]; Fax: [phone]

## 2018-05-12 ENCOUNTER — Ambulatory Visit (INDEPENDENT_AMBULATORY_CARE_PROVIDER_SITE_OTHER): Payer: Medicare HMO | Admitting: *Deleted

## 2018-05-12 DIAGNOSIS — I255 Ischemic cardiomyopathy: Secondary | ICD-10-CM

## 2018-05-12 DIAGNOSIS — I5022 Chronic systolic (congestive) heart failure: Secondary | ICD-10-CM

## 2018-05-12 NOTE — Progress Notes (Signed)
Remote ICD transmission.   

## 2018-05-23 ENCOUNTER — Telehealth (HOSPITAL_COMMUNITY): Payer: Self-pay | Admitting: *Deleted

## 2018-05-29 ENCOUNTER — Encounter (HOSPITAL_COMMUNITY): Payer: Self-pay

## 2018-05-31 ENCOUNTER — Encounter (HOSPITAL_COMMUNITY): Payer: Self-pay

## 2018-06-02 ENCOUNTER — Encounter (HOSPITAL_COMMUNITY): Payer: Self-pay

## 2018-06-02 LAB — CUP PACEART REMOTE DEVICE CHECK
Battery Voltage: 2.98 V
Brady Statistic AP VP Percent: 0.04 %
Brady Statistic AS VP Percent: 98.53 %
Brady Statistic RA Percent Paced: 0.05 %
Brady Statistic RV Percent Paced: 0.51 %
Date Time Interrogation Session: 20190705073323
HIGH POWER IMPEDANCE MEASURED VALUE: 38 Ohm
HighPow Impedance: 44 Ohm
Implantable Lead Implant Date: 20090610
Implantable Lead Location: 753859
Implantable Lead Model: 4194
Lead Channel Impedance Value: 285 Ohm
Lead Channel Impedance Value: 342 Ohm
Lead Channel Impedance Value: 456 Ohm
Lead Channel Impedance Value: 532 Ohm
Lead Channel Impedance Value: 646 Ohm
Lead Channel Pacing Threshold Amplitude: 0.75 V
Lead Channel Pacing Threshold Pulse Width: 0.4 ms
Lead Channel Pacing Threshold Pulse Width: 0.4 ms
Lead Channel Setting Pacing Amplitude: 1.5 V
Lead Channel Setting Pacing Amplitude: 2 V
Lead Channel Setting Pacing Pulse Width: 0.4 ms
Lead Channel Setting Sensing Sensitivity: 0.3 mV
MDC IDC LEAD IMPLANT DT: 20090610
MDC IDC LEAD IMPLANT DT: 20090610
MDC IDC LEAD LOCATION: 753858
MDC IDC LEAD LOCATION: 753860
MDC IDC MSMT BATTERY REMAINING LONGEVITY: 71 mo
MDC IDC MSMT LEADCHNL LV PACING THRESHOLD AMPLITUDE: 1.125 V
MDC IDC MSMT LEADCHNL LV PACING THRESHOLD PULSEWIDTH: 0.4 ms
MDC IDC MSMT LEADCHNL RA SENSING INTR AMPL: 2.25 mV
MDC IDC MSMT LEADCHNL RA SENSING INTR AMPL: 2.25 mV
MDC IDC MSMT LEADCHNL RV IMPEDANCE VALUE: 456 Ohm
MDC IDC MSMT LEADCHNL RV PACING THRESHOLD AMPLITUDE: 0.875 V
MDC IDC MSMT LEADCHNL RV SENSING INTR AMPL: 2.375 mV
MDC IDC MSMT LEADCHNL RV SENSING INTR AMPL: 2.375 mV
MDC IDC PG IMPLANT DT: 20160323
MDC IDC SET LEADCHNL LV PACING AMPLITUDE: 2.25 V
MDC IDC SET LEADCHNL LV PACING PULSEWIDTH: 0.4 ms
MDC IDC STAT BRADY AP VS PERCENT: 0.01 %
MDC IDC STAT BRADY AS VS PERCENT: 1.41 %

## 2018-06-05 ENCOUNTER — Encounter (HOSPITAL_COMMUNITY)
Admission: RE | Admit: 2018-06-05 | Discharge: 2018-06-05 | Disposition: A | Discharge status: Home / Self Care | Payer: Self-pay | Source: Ambulatory Visit | Attending: Interventional Cardiology | Admitting: Interventional Cardiology

## 2018-06-05 DIAGNOSIS — I255 Ischemic cardiomyopathy: Secondary | ICD-10-CM | POA: Insufficient documentation

## 2018-06-05 DIAGNOSIS — I110 Hypertensive heart disease with heart failure: Secondary | ICD-10-CM | POA: Insufficient documentation

## 2018-06-05 DIAGNOSIS — I509 Heart failure, unspecified: Secondary | ICD-10-CM | POA: Insufficient documentation

## 2018-06-05 DIAGNOSIS — Z95810 Presence of automatic (implantable) cardiac defibrillator: Secondary | ICD-10-CM | POA: Insufficient documentation

## 2018-06-05 DIAGNOSIS — I2510 Atherosclerotic heart disease of native coronary artery without angina pectoris: Secondary | ICD-10-CM | POA: Insufficient documentation

## 2018-06-05 DIAGNOSIS — I252 Old myocardial infarction: Secondary | ICD-10-CM | POA: Insufficient documentation

## 2018-06-05 DIAGNOSIS — Z8673 Personal history of transient ischemic attack (TIA), and cerebral infarction without residual deficits: Secondary | ICD-10-CM | POA: Insufficient documentation

## 2018-06-05 DIAGNOSIS — E119 Type 2 diabetes mellitus without complications: Secondary | ICD-10-CM | POA: Insufficient documentation

## 2018-06-07 ENCOUNTER — Encounter (HOSPITAL_COMMUNITY)
Admission: RE | Admit: 2018-06-07 | Discharge: 2018-06-07 | Disposition: A | Discharge status: Home / Self Care | Payer: Self-pay | Source: Ambulatory Visit | Attending: Interventional Cardiology | Admitting: Interventional Cardiology

## 2018-06-09 ENCOUNTER — Encounter (HOSPITAL_COMMUNITY)
Admission: RE | Admit: 2018-06-09 | Discharge: 2018-06-09 | Disposition: A | Discharge status: Home / Self Care | Payer: Self-pay | Source: Ambulatory Visit | Attending: Interventional Cardiology | Admitting: Interventional Cardiology

## 2018-06-09 DIAGNOSIS — Z95810 Presence of automatic (implantable) cardiac defibrillator: Secondary | ICD-10-CM | POA: Insufficient documentation

## 2018-06-09 DIAGNOSIS — I252 Old myocardial infarction: Secondary | ICD-10-CM | POA: Insufficient documentation

## 2018-06-09 DIAGNOSIS — E119 Type 2 diabetes mellitus without complications: Secondary | ICD-10-CM | POA: Insufficient documentation

## 2018-06-09 DIAGNOSIS — Z8673 Personal history of transient ischemic attack (TIA), and cerebral infarction without residual deficits: Secondary | ICD-10-CM | POA: Insufficient documentation

## 2018-06-09 DIAGNOSIS — I2510 Atherosclerotic heart disease of native coronary artery without angina pectoris: Secondary | ICD-10-CM | POA: Insufficient documentation

## 2018-06-09 DIAGNOSIS — I509 Heart failure, unspecified: Secondary | ICD-10-CM | POA: Insufficient documentation

## 2018-06-09 DIAGNOSIS — I110 Hypertensive heart disease with heart failure: Secondary | ICD-10-CM | POA: Insufficient documentation

## 2018-06-09 DIAGNOSIS — I255 Ischemic cardiomyopathy: Secondary | ICD-10-CM | POA: Insufficient documentation

## 2018-06-12 ENCOUNTER — Encounter (HOSPITAL_COMMUNITY)
Admission: RE | Admit: 2018-06-12 | Discharge: 2018-06-12 | Disposition: A | Discharge status: Home / Self Care | Payer: Medicare HMO | Source: Ambulatory Visit | Attending: Interventional Cardiology | Admitting: Interventional Cardiology

## 2018-06-13 DIAGNOSIS — F332 Major depressive disorder, recurrent severe without psychotic features: Secondary | ICD-10-CM | POA: Diagnosis not present

## 2018-06-13 DIAGNOSIS — F411 Generalized anxiety disorder: Secondary | ICD-10-CM | POA: Diagnosis not present

## 2018-06-14 ENCOUNTER — Encounter (HOSPITAL_COMMUNITY)
Admission: RE | Admit: 2018-06-14 | Discharge: 2018-06-14 | Disposition: A | Discharge status: Home / Self Care | Payer: Self-pay | Source: Ambulatory Visit | Attending: Interventional Cardiology | Admitting: Interventional Cardiology

## 2018-06-16 ENCOUNTER — Encounter (HOSPITAL_COMMUNITY)
Admission: RE | Admit: 2018-06-16 | Discharge: 2018-06-16 | Disposition: A | Discharge status: Home / Self Care | Payer: Self-pay | Source: Ambulatory Visit | Attending: Interventional Cardiology | Admitting: Interventional Cardiology

## 2018-06-19 ENCOUNTER — Encounter (HOSPITAL_COMMUNITY)
Admission: RE | Admit: 2018-06-19 | Discharge: 2018-06-19 | Disposition: A | Discharge status: Home / Self Care | Payer: Self-pay | Source: Ambulatory Visit | Attending: Interventional Cardiology | Admitting: Interventional Cardiology

## 2018-06-21 ENCOUNTER — Encounter (HOSPITAL_COMMUNITY)
Admission: RE | Admit: 2018-06-21 | Discharge: 2018-06-21 | Disposition: A | Discharge status: Home / Self Care | Payer: Self-pay | Source: Ambulatory Visit | Attending: Interventional Cardiology | Admitting: Interventional Cardiology

## 2018-06-23 ENCOUNTER — Encounter (HOSPITAL_COMMUNITY): Payer: Self-pay

## 2018-06-26 ENCOUNTER — Encounter (HOSPITAL_COMMUNITY)
Admission: RE | Admit: 2018-06-26 | Discharge: 2018-06-26 | Disposition: A | Discharge status: Home / Self Care | Payer: Self-pay | Source: Ambulatory Visit | Attending: Interventional Cardiology | Admitting: Interventional Cardiology

## 2018-06-28 ENCOUNTER — Encounter (HOSPITAL_COMMUNITY)
Admission: RE | Admit: 2018-06-28 | Discharge: 2018-06-28 | Disposition: A | Discharge status: Home / Self Care | Payer: Self-pay | Source: Ambulatory Visit | Attending: Interventional Cardiology | Admitting: Interventional Cardiology

## 2018-06-30 ENCOUNTER — Encounter (HOSPITAL_COMMUNITY): Payer: Self-pay

## 2018-07-03 ENCOUNTER — Encounter (HOSPITAL_COMMUNITY)
Admission: RE | Admit: 2018-07-03 | Discharge: 2018-07-03 | Disposition: A | Discharge status: Home / Self Care | Payer: Medicare HMO | Source: Ambulatory Visit | Attending: Interventional Cardiology | Admitting: Interventional Cardiology

## 2018-07-05 ENCOUNTER — Encounter (HOSPITAL_COMMUNITY): Payer: Self-pay

## 2018-07-07 ENCOUNTER — Encounter (HOSPITAL_COMMUNITY): Payer: Self-pay

## 2018-07-12 ENCOUNTER — Encounter (HOSPITAL_COMMUNITY)
Admission: RE | Admit: 2018-07-12 | Discharge: 2018-07-12 | Disposition: A | Discharge status: Home / Self Care | Payer: Medicare HMO | Source: Ambulatory Visit | Attending: Interventional Cardiology | Admitting: Interventional Cardiology

## 2018-07-12 DIAGNOSIS — I252 Old myocardial infarction: Secondary | ICD-10-CM | POA: Insufficient documentation

## 2018-07-12 DIAGNOSIS — Z8673 Personal history of transient ischemic attack (TIA), and cerebral infarction without residual deficits: Secondary | ICD-10-CM | POA: Insufficient documentation

## 2018-07-12 DIAGNOSIS — I255 Ischemic cardiomyopathy: Secondary | ICD-10-CM | POA: Insufficient documentation

## 2018-07-12 DIAGNOSIS — I2510 Atherosclerotic heart disease of native coronary artery without angina pectoris: Secondary | ICD-10-CM | POA: Insufficient documentation

## 2018-07-12 DIAGNOSIS — I509 Heart failure, unspecified: Secondary | ICD-10-CM | POA: Insufficient documentation

## 2018-07-12 DIAGNOSIS — E119 Type 2 diabetes mellitus without complications: Secondary | ICD-10-CM | POA: Insufficient documentation

## 2018-07-12 DIAGNOSIS — I110 Hypertensive heart disease with heart failure: Secondary | ICD-10-CM | POA: Insufficient documentation

## 2018-07-12 DIAGNOSIS — Z95810 Presence of automatic (implantable) cardiac defibrillator: Secondary | ICD-10-CM | POA: Insufficient documentation

## 2018-07-13 DIAGNOSIS — I509 Heart failure, unspecified: Secondary | ICD-10-CM | POA: Diagnosis not present

## 2018-07-13 DIAGNOSIS — Z8673 Personal history of transient ischemic attack (TIA), and cerebral infarction without residual deficits: Secondary | ICD-10-CM | POA: Diagnosis not present

## 2018-07-13 DIAGNOSIS — I110 Hypertensive heart disease with heart failure: Secondary | ICD-10-CM | POA: Diagnosis not present

## 2018-07-13 DIAGNOSIS — Z95810 Presence of automatic (implantable) cardiac defibrillator: Secondary | ICD-10-CM | POA: Diagnosis not present

## 2018-07-13 DIAGNOSIS — I252 Old myocardial infarction: Secondary | ICD-10-CM | POA: Diagnosis not present

## 2018-07-13 DIAGNOSIS — F332 Major depressive disorder, recurrent severe without psychotic features: Secondary | ICD-10-CM | POA: Diagnosis not present

## 2018-07-13 DIAGNOSIS — I255 Ischemic cardiomyopathy: Secondary | ICD-10-CM | POA: Diagnosis not present

## 2018-07-13 DIAGNOSIS — E119 Type 2 diabetes mellitus without complications: Secondary | ICD-10-CM | POA: Diagnosis not present

## 2018-07-13 DIAGNOSIS — F411 Generalized anxiety disorder: Secondary | ICD-10-CM | POA: Diagnosis not present

## 2018-07-13 DIAGNOSIS — I2510 Atherosclerotic heart disease of native coronary artery without angina pectoris: Secondary | ICD-10-CM | POA: Diagnosis present

## 2018-07-14 ENCOUNTER — Encounter (HOSPITAL_COMMUNITY)
Admission: RE | Admit: 2018-07-14 | Discharge: 2018-07-14 | Disposition: A | Discharge status: Home / Self Care | Payer: Medicare HMO | Source: Ambulatory Visit | Attending: Interventional Cardiology | Admitting: Interventional Cardiology

## 2018-07-17 ENCOUNTER — Encounter (HOSPITAL_COMMUNITY)
Admission: RE | Admit: 2018-07-17 | Discharge: 2018-07-17 | Disposition: A | Discharge status: Home / Self Care | Payer: Medicare HMO | Source: Ambulatory Visit | Attending: Interventional Cardiology | Admitting: Interventional Cardiology

## 2018-07-19 ENCOUNTER — Encounter (HOSPITAL_COMMUNITY)
Admission: RE | Admit: 2018-07-19 | Discharge: 2018-07-19 | Disposition: A | Discharge status: Home / Self Care | Payer: Medicare HMO | Source: Ambulatory Visit | Attending: Interventional Cardiology | Admitting: Interventional Cardiology

## 2018-07-21 ENCOUNTER — Encounter (HOSPITAL_COMMUNITY): Payer: Medicare HMO

## 2018-07-24 ENCOUNTER — Encounter (HOSPITAL_COMMUNITY)
Admission: RE | Admit: 2018-07-24 | Discharge: 2018-07-24 | Disposition: A | Discharge status: Home / Self Care | Payer: Medicare HMO | Source: Ambulatory Visit | Attending: Interventional Cardiology | Admitting: Interventional Cardiology

## 2018-07-26 ENCOUNTER — Encounter (HOSPITAL_COMMUNITY)
Admission: RE | Admit: 2018-07-26 | Discharge: 2018-07-26 | Disposition: A | Discharge status: Home / Self Care | Payer: Medicare HMO | Source: Ambulatory Visit | Attending: Interventional Cardiology | Admitting: Interventional Cardiology

## 2018-07-27 DIAGNOSIS — F411 Generalized anxiety disorder: Secondary | ICD-10-CM | POA: Diagnosis not present

## 2018-07-27 DIAGNOSIS — F332 Major depressive disorder, recurrent severe without psychotic features: Secondary | ICD-10-CM | POA: Diagnosis not present

## 2018-07-28 ENCOUNTER — Encounter (HOSPITAL_COMMUNITY): Payer: Medicare HMO

## 2018-07-31 ENCOUNTER — Encounter (HOSPITAL_COMMUNITY)
Admission: RE | Admit: 2018-07-31 | Discharge: 2018-07-31 | Disposition: A | Discharge status: Home / Self Care | Payer: Medicare HMO | Source: Ambulatory Visit | Attending: Interventional Cardiology | Admitting: Interventional Cardiology

## 2018-08-02 ENCOUNTER — Telehealth: Payer: Self-pay | Admitting: Cardiology

## 2018-08-02 ENCOUNTER — Other Ambulatory Visit: Payer: Self-pay

## 2018-08-02 ENCOUNTER — Ambulatory Visit (HOSPITAL_COMMUNITY)
Admission: RE | Admit: 2018-08-02 | Discharge: 2018-08-02 | Disposition: A | Discharge status: Home / Self Care | Payer: Medicare HMO | Source: Ambulatory Visit | Attending: Family Medicine | Admitting: Family Medicine

## 2018-08-02 ENCOUNTER — Emergency Department (HOSPITAL_COMMUNITY): Payer: Medicare HMO

## 2018-08-02 ENCOUNTER — Encounter (HOSPITAL_COMMUNITY)
Admission: RE | Admit: 2018-08-02 | Discharge: 2018-08-02 | Disposition: A | Discharge status: Home / Self Care | Payer: Medicare HMO | Source: Ambulatory Visit | Attending: Interventional Cardiology | Admitting: Interventional Cardiology

## 2018-08-02 ENCOUNTER — Encounter (HOSPITAL_COMMUNITY): Payer: Self-pay | Admitting: Emergency Medicine

## 2018-08-02 ENCOUNTER — Observation Stay (HOSPITAL_COMMUNITY)
Admission: EM | Admit: 2018-08-02 | Discharge: 2018-08-05 | Disposition: A | Discharge status: Home / Self Care | Payer: Medicare HMO | Attending: Cardiovascular Disease | Admitting: Cardiovascular Disease

## 2018-08-02 DIAGNOSIS — I208 Other forms of angina pectoris: Secondary | ICD-10-CM | POA: Insufficient documentation

## 2018-08-02 DIAGNOSIS — I471 Supraventricular tachycardia, unspecified: Secondary | ICD-10-CM | POA: Diagnosis present

## 2018-08-02 DIAGNOSIS — M791 Myalgia, unspecified site: Secondary | ICD-10-CM | POA: Diagnosis not present

## 2018-08-02 DIAGNOSIS — I130 Hypertensive heart and chronic kidney disease with heart failure and stage 1 through stage 4 chronic kidney disease, or unspecified chronic kidney disease: Secondary | ICD-10-CM | POA: Insufficient documentation

## 2018-08-02 DIAGNOSIS — Z7902 Long term (current) use of antithrombotics/antiplatelets: Secondary | ICD-10-CM | POA: Diagnosis not present

## 2018-08-02 DIAGNOSIS — I509 Heart failure, unspecified: Secondary | ICD-10-CM | POA: Diagnosis not present

## 2018-08-02 DIAGNOSIS — Z91040 Latex allergy status: Secondary | ICD-10-CM | POA: Diagnosis not present

## 2018-08-02 DIAGNOSIS — R079 Chest pain, unspecified: Secondary | ICD-10-CM | POA: Diagnosis not present

## 2018-08-02 DIAGNOSIS — Z7982 Long term (current) use of aspirin: Secondary | ICD-10-CM | POA: Diagnosis not present

## 2018-08-02 DIAGNOSIS — Z23 Encounter for immunization: Secondary | ICD-10-CM | POA: Insufficient documentation

## 2018-08-02 DIAGNOSIS — I110 Hypertensive heart disease with heart failure: Secondary | ICD-10-CM | POA: Diagnosis not present

## 2018-08-02 DIAGNOSIS — I252 Old myocardial infarction: Secondary | ICD-10-CM | POA: Diagnosis not present

## 2018-08-02 DIAGNOSIS — E1122 Type 2 diabetes mellitus with diabetic chronic kidney disease: Secondary | ICD-10-CM | POA: Insufficient documentation

## 2018-08-02 DIAGNOSIS — N184 Chronic kidney disease, stage 4 (severe): Secondary | ICD-10-CM | POA: Insufficient documentation

## 2018-08-02 DIAGNOSIS — I25119 Atherosclerotic heart disease of native coronary artery with unspecified angina pectoris: Principal | ICD-10-CM | POA: Insufficient documentation

## 2018-08-02 DIAGNOSIS — I5032 Chronic diastolic (congestive) heart failure: Secondary | ICD-10-CM | POA: Diagnosis not present

## 2018-08-02 DIAGNOSIS — Z79899 Other long term (current) drug therapy: Secondary | ICD-10-CM | POA: Insufficient documentation

## 2018-08-02 DIAGNOSIS — I25709 Atherosclerosis of coronary artery bypass graft(s), unspecified, with unspecified angina pectoris: Secondary | ICD-10-CM | POA: Diagnosis present

## 2018-08-02 DIAGNOSIS — N183 Chronic kidney disease, stage 3 unspecified: Secondary | ICD-10-CM | POA: Diagnosis present

## 2018-08-02 DIAGNOSIS — E785 Hyperlipidemia, unspecified: Secondary | ICD-10-CM | POA: Diagnosis present

## 2018-08-02 DIAGNOSIS — I10 Essential (primary) hypertension: Secondary | ICD-10-CM | POA: Diagnosis present

## 2018-08-02 DIAGNOSIS — Z794 Long term (current) use of insulin: Secondary | ICD-10-CM | POA: Insufficient documentation

## 2018-08-02 DIAGNOSIS — I2582 Chronic total occlusion of coronary artery: Secondary | ICD-10-CM | POA: Insufficient documentation

## 2018-08-02 DIAGNOSIS — Z8673 Personal history of transient ischemic attack (TIA), and cerebral infarction without residual deficits: Secondary | ICD-10-CM | POA: Diagnosis not present

## 2018-08-02 DIAGNOSIS — Z95810 Presence of automatic (implantable) cardiac defibrillator: Secondary | ICD-10-CM | POA: Diagnosis not present

## 2018-08-02 DIAGNOSIS — I2510 Atherosclerotic heart disease of native coronary artery without angina pectoris: Secondary | ICD-10-CM | POA: Diagnosis not present

## 2018-08-02 DIAGNOSIS — I255 Ischemic cardiomyopathy: Secondary | ICD-10-CM | POA: Diagnosis not present

## 2018-08-02 DIAGNOSIS — Z951 Presence of aortocoronary bypass graft: Secondary | ICD-10-CM | POA: Diagnosis not present

## 2018-08-02 DIAGNOSIS — E119 Type 2 diabetes mellitus without complications: Secondary | ICD-10-CM | POA: Diagnosis not present

## 2018-08-02 DIAGNOSIS — R0789 Other chest pain: Secondary | ICD-10-CM | POA: Diagnosis present

## 2018-08-02 LAB — BASIC METABOLIC PANEL WITH GFR
Anion gap: 11 (ref 5–15)
BUN: 10 mg/dL (ref 8–23)
CO2: 29 mmol/L (ref 22–32)
Calcium: 8.9 mg/dL (ref 8.9–10.3)
Chloride: 100 mmol/L (ref 98–111)
Creatinine, Ser: 2.08 mg/dL — ABNORMAL HIGH (ref 0.44–1.00)
GFR calc Af Amer: 28 mL/min — ABNORMAL LOW (ref >60)
GFR calc non Af Amer: 24 mL/min — ABNORMAL LOW (ref >60)
Glucose, Bld: 179 mg/dL — ABNORMAL HIGH (ref 70–99)
Potassium: 4.2 mmol/L (ref 3.5–5.1)
Sodium: 140 mmol/L (ref 135–145)

## 2018-08-02 LAB — CBC
HEMATOCRIT: 32.4 % — AB (ref 36.0–46.0)
HEMATOCRIT: 31.4 % — AB (ref 36.0–46.0)
HEMOGLOBIN: 10.5 g/dL — AB (ref 12.0–15.0)
Hemoglobin: 10.4 g/dL — ABNORMAL LOW (ref 12.0–15.0)
MCH: 29.5 pg (ref 26.0–34.0)
MCH: 30.3 pg (ref 26.0–34.0)
MCHC: 32.1 g/dL (ref 30.0–36.0)
MCHC: 33.4 g/dL (ref 30.0–36.0)
MCV: 92 fL (ref 78.0–100.0)
MCV: 90.5 fL (ref 78.0–100.0)
PLATELETS: 174 10*3/uL (ref 150–400)
Platelets: 171 10*3/uL (ref 150–400)
RBC: 3.52 MIL/uL — ABNORMAL LOW (ref 3.87–5.11)
RBC: 3.47 MIL/uL — ABNORMAL LOW (ref 3.87–5.11)
RDW: 13 % (ref 11.5–15.5)
RDW: 12.9 % (ref 11.5–15.5)
WBC: 6.6 10*3/uL (ref 4.0–10.5)
WBC: 7.5 10*3/uL (ref 4.0–10.5)

## 2018-08-02 LAB — GLUCOSE, CAPILLARY
GLUCOSE-CAPILLARY: 112 mg/dL — AB (ref 70–99)
Glucose-Capillary: 103 mg/dL — ABNORMAL HIGH (ref 70–99)

## 2018-08-02 LAB — TROPONIN I

## 2018-08-02 LAB — CREATININE, SERUM
Creatinine, Ser: 1.99 mg/dL — ABNORMAL HIGH (ref 0.44–1.00)
GFR, EST AFRICAN AMERICAN: 29 mL/min — AB (ref >60)
GFR, EST NON AFRICAN AMERICAN: 25 mL/min — AB (ref >60)

## 2018-08-02 LAB — I-STAT TROPONIN, ED: Troponin i, poc: 0 ng/mL (ref 0.00–0.08)

## 2018-08-02 MED ORDER — CLOPIDOGREL BISULFATE 75 MG PO TABS
75.0000 mg | ORAL_TABLET | Freq: Every day | ORAL | Status: DC
  Administered 2018-08-03 – 2018-08-05 (×3): 75 mg via ORAL
  Filled 2018-08-02 (×3): qty 1

## 2018-08-02 MED ORDER — PANTOPRAZOLE SODIUM 40 MG PO TBEC
40.0000 mg | DELAYED_RELEASE_TABLET | Freq: Every day | ORAL | Status: DC
  Administered 2018-08-03 – 2018-08-05 (×3): 40 mg via ORAL
  Filled 2018-08-02 (×3): qty 1

## 2018-08-02 MED ORDER — ALPRAZOLAM 0.5 MG PO TABS
1.0000 mg | ORAL_TABLET | Freq: Every day | ORAL | Status: DC
  Administered 2018-08-03 – 2018-08-05 (×3): 1 mg via ORAL
  Filled 2018-08-02 (×3): qty 2

## 2018-08-02 MED ORDER — POLYVINYL ALCOHOL 1.4 % OP SOLN
Freq: Every day | OPHTHALMIC | Status: DC
  Administered 2018-08-02 – 2018-08-03 (×2): via OPHTHALMIC
  Filled 2018-08-02: qty 15

## 2018-08-02 MED ORDER — ASPIRIN 81 MG PO CHEW
324.0000 mg | CHEWABLE_TABLET | Freq: Once | ORAL | Status: DC
  Filled 2018-08-02 (×2): qty 4

## 2018-08-02 MED ORDER — SODIUM CHLORIDE 0.9 % IV SOLN: 250.0000 mL | INTRAVENOUS | Status: DC | PRN

## 2018-08-02 MED ORDER — EZETIMIBE 10 MG PO TABS
10.0000 mg | ORAL_TABLET | Freq: Every day | ORAL | Status: DC
  Administered 2018-08-03 – 2018-08-05 (×3): 10 mg via ORAL
  Filled 2018-08-02 (×3): qty 1

## 2018-08-02 MED ORDER — NITROGLYCERIN 0.4 MG SL SUBL
0.4000 mg | SUBLINGUAL_TABLET | SUBLINGUAL | Status: DC | PRN
  Administered 2018-08-03 (×2): 0.4 mg via SUBLINGUAL
  Filled 2018-08-02: qty 1

## 2018-08-02 MED ORDER — ISOSORBIDE MONONITRATE ER 60 MG PO TB24
60.0000 mg | ORAL_TABLET | Freq: Every day | ORAL | Status: DC
  Administered 2018-08-03 – 2018-08-05 (×3): 60 mg via ORAL
  Filled 2018-08-02 (×3): qty 1

## 2018-08-02 MED ORDER — ALBUTEROL SULFATE (2.5 MG/3ML) 0.083% IN NEBU: 3.0000 mL | INHALATION_SOLUTION | RESPIRATORY_TRACT | Status: DC | PRN

## 2018-08-02 MED ORDER — ALPRAZOLAM 0.5 MG PO TABS
0.5000 mg | ORAL_TABLET | ORAL | Status: DC
  Administered 2018-08-02 – 2018-08-04 (×4): 0.5 mg via ORAL
  Filled 2018-08-02 (×4): qty 2
  Filled 2018-08-02: qty 1

## 2018-08-02 MED ORDER — DOXAZOSIN MESYLATE 4 MG PO TABS
4.0000 mg | ORAL_TABLET | Freq: Every day | ORAL | Status: DC
  Administered 2018-08-02 – 2018-08-04 (×3): 4 mg via ORAL
  Filled 2018-08-02 (×3): qty 1

## 2018-08-02 MED ORDER — SODIUM CHLORIDE 0.9% FLUSH
3.0000 mL | Freq: Two times a day (BID) | INTRAVENOUS | Status: DC
  Administered 2018-08-02 – 2018-08-04 (×3): 3 mL via INTRAVENOUS

## 2018-08-02 MED ORDER — VENLAFAXINE HCL 75 MG PO TABS
200.0000 mg | ORAL_TABLET | Freq: Every day | ORAL | Status: DC
  Administered 2018-08-04 – 2018-08-05 (×2): 200 mg via ORAL
  Filled 2018-08-02: qty 1
  Filled 2018-08-02 (×2): qty 4

## 2018-08-02 MED ORDER — OLMESARTAN MEDOXOMIL 20 MG PO TABS
20.0000 mg | ORAL_TABLET | Freq: Every day | ORAL | Status: DC
  Administered 2018-08-03 – 2018-08-05 (×3): 20 mg via ORAL
  Filled 2018-08-02 (×3): qty 1

## 2018-08-02 MED ORDER — LORATADINE 10 MG PO TABS
10.0000 mg | ORAL_TABLET | Freq: Every day | ORAL | Status: DC
  Administered 2018-08-03 – 2018-08-05 (×3): 10 mg via ORAL
  Filled 2018-08-02 (×3): qty 1

## 2018-08-02 MED ORDER — INFLUENZA VAC SPLIT HIGH-DOSE 0.5 ML IM SUSY
0.5000 mL | PREFILLED_SYRINGE | INTRAMUSCULAR | Status: AC
  Administered 2018-08-03: 0.5 mL via INTRAMUSCULAR
  Filled 2018-08-02: qty 0.5

## 2018-08-02 MED ORDER — VITAMIN D 1000 UNITS PO TABS
1000.0000 [IU] | ORAL_TABLET | Freq: Every day | ORAL | Status: DC
  Administered 2018-08-03 – 2018-08-05 (×3): 1000 [IU] via ORAL
  Filled 2018-08-02 (×3): qty 1

## 2018-08-02 MED ORDER — HEPARIN SODIUM (PORCINE) 5000 UNIT/ML IJ SOLN
5000.0000 [IU] | Freq: Three times a day (TID) | INTRAMUSCULAR | Status: DC
  Administered 2018-08-02 – 2018-08-04 (×5): 5000 [IU] via SUBCUTANEOUS
  Filled 2018-08-02 (×5): qty 1

## 2018-08-02 MED ORDER — TRAMADOL HCL 50 MG PO TABS
50.0000 mg | ORAL_TABLET | Freq: Two times a day (BID) | ORAL | Status: DC | PRN
  Administered 2018-08-02 – 2018-08-03 (×3): 50 mg via ORAL
  Filled 2018-08-02 (×3): qty 1

## 2018-08-02 MED ORDER — SIMVASTATIN 20 MG PO TABS
20.0000 mg | ORAL_TABLET | Freq: Every day | ORAL | Status: DC
  Administered 2018-08-02 – 2018-08-04 (×3): 20 mg via ORAL
  Filled 2018-08-02 (×3): qty 1

## 2018-08-02 MED ORDER — POLYVINYL ALCOHOL 1.4 % OP SOLN
2.0000 [drp] | Freq: Every day | OPHTHALMIC | Status: DC | PRN
  Filled 2018-08-02: qty 15

## 2018-08-02 MED ORDER — METOPROLOL SUCCINATE ER 100 MG PO TB24
200.0000 mg | ORAL_TABLET | Freq: Every day | ORAL | Status: DC
  Administered 2018-08-03 – 2018-08-05 (×3): 200 mg via ORAL
  Filled 2018-08-02 (×3): qty 2

## 2018-08-02 MED ORDER — SODIUM CHLORIDE 0.9% FLUSH: 3.0000 mL | INTRAVENOUS | Status: DC | PRN

## 2018-08-02 MED ORDER — VITAMIN D-3 25 MCG (1000 UT) PO CAPS: 1.0000 | ORAL_CAPSULE | Freq: Every day | ORAL | Status: DC

## 2018-08-02 MED ORDER — INSULIN ASPART 100 UNIT/ML ~~LOC~~ SOLN
0.0000 [IU] | Freq: Three times a day (TID) | SUBCUTANEOUS | Status: DC
  Administered 2018-08-03 – 2018-08-05 (×3): 1 [IU] via SUBCUTANEOUS

## 2018-08-02 MED ORDER — NON FORMULARY: Freq: Every day | Status: DC

## 2018-08-02 MED ORDER — PNEUMOCOCCAL VAC POLYVALENT 25 MCG/0.5ML IJ INJ
0.5000 mL | INJECTION | INTRAMUSCULAR | Status: AC
  Administered 2018-08-03: 0.5 mL via INTRAMUSCULAR
  Filled 2018-08-02: qty 0.5

## 2018-08-02 MED ORDER — ASPIRIN 325 MG PO TABS
325.0000 mg | ORAL_TABLET | Freq: Every day | ORAL | Status: DC
  Administered 2018-08-03 – 2018-08-05 (×3): 325 mg via ORAL
  Filled 2018-08-02 (×4): qty 1

## 2018-08-02 MED ORDER — ONDANSETRON HCL 4 MG PO TABS: 8.0000 mg | ORAL_TABLET | Freq: Three times a day (TID) | ORAL | Status: DC | PRN

## 2018-08-02 MED ORDER — ADULT MULTIVITAMIN W/MINERALS CH
1.0000 | ORAL_TABLET | Freq: Every day | ORAL | Status: DC
  Administered 2018-08-03 – 2018-08-05 (×3): 1 via ORAL
  Filled 2018-08-02 (×3): qty 1

## 2018-08-02 MED ORDER — POTASSIUM CHLORIDE CRYS ER 20 MEQ PO TBCR
20.0000 meq | EXTENDED_RELEASE_TABLET | Freq: Two times a day (BID) | ORAL | Status: DC
  Administered 2018-08-02 – 2018-08-05 (×6): 20 meq via ORAL
  Filled 2018-08-02 (×6): qty 1

## 2018-08-02 MED ORDER — VENLAFAXINE HCL 50 MG PO TABS
100.0000 mg | ORAL_TABLET | Freq: Every day | ORAL | Status: DC
  Administered 2018-08-03 – 2018-08-04 (×2): 100 mg via ORAL
  Filled 2018-08-02 (×3): qty 2

## 2018-08-02 MED ORDER — FLUTICASONE PROPIONATE 50 MCG/ACT NA SUSP
2.0000 | Freq: Every day | NASAL | Status: DC | PRN
  Filled 2018-08-02: qty 16

## 2018-08-02 MED ORDER — FUROSEMIDE 80 MG PO TABS
160.0000 mg | ORAL_TABLET | Freq: Every day | ORAL | Status: DC
  Administered 2018-08-03 – 2018-08-05 (×3): 160 mg via ORAL
  Filled 2018-08-02 (×3): qty 2

## 2018-08-02 NOTE — Progress Notes (Signed)
Assisted Dr. Burt Knack with admit orders - per plan, NPO after midnight, Lexiscan nuc in AM, continue home meds except will hold Lantus since she will be NPO and add SSI with CBG checks. Henny Strauch PA-C

## 2018-08-02 NOTE — Progress Notes (Addendum)
Patient reported having chest discomfort 8/10 on a 1-10 scale with radiation to her left shoulder and feeling lightheaded. Exercise stopped. Blood pressure 120/80. Patient placed on Zoll. Telemetry rhythm V paced with underlying sinus rhythm rate 80. Patient placed on 3l/min of oxygen. Patient was taken to the treatment room. 12 lead ECG obtained. [name] reported that her discomfort resolved after rest. CBG 103. Sharrell Ku [facility] called and notified. Advised that [name] be taken to the ED for further evaluation. Patient's daughter Solmon Ice was called and notified. Patient was taken to the ED via stretcher on Zoll and oxygen. The ED was called and notified about today's events before the patient was transferred. The EMT took the patient to triage.Barnet Pall, RN,BSN 08/02/2018 4:38 PM

## 2018-08-02 NOTE — ED Provider Notes (Signed)
[facility] EMERGENCY DEPARTMENT Provider Note   CSN: [id number] Arrival date & time: 08/02/18  1615     History   Chief Complaint Chief Complaint  Patient presents with  . Chest Pain    HPI [name] is a 67 y.o. female.  HPI Patients with history of coronary artery disease with status post bypass surgery with AICD in place.  Was at cardiac rehab exercising when she developed left-sided chest pain rating to her left shoulder.  She had mild lightheadedness.  This resolved after rest.  Had normal vital signs at the time.  EMS was called.  Patient only complains of a frontal headache.  Denies any chest pain or shortness of breath.  Says she is had some chronic left-sided chest wall tenderness after pacemaker placement.  Denies any new lower extremity swelling or pain.  No fever or chills. Past Medical History:  Diagnosis Date  . AICD (automatic cardioverter/defibrillator) present    [employer]- Dr. Beckie Salts follows  . Anginal pain ([place])   . Anxiety   . Asthma   . Asthma   . Back pain    "pinched nerve-lower back" - Dr. Nelva Bush follows.  . Biventricular implantable cardioverter-defibrillator in situ 06/18/2011  . Cerebral infarction ([place]) 11/11/2012  . Cervical dysplasia   . CHF (congestive heart failure) ([place])   . Chronic diastolic heart failure ([place]) 03/22/2016  . Chronic kidney disease    Dr. Posey Pronto follows.  . Chronic renal insufficiency, stage III (moderate) ([place]) 11/11/2012  . Complication of anesthesia    "I wake up during surgeries" (02/14/2013)  . Coronary artery disease   . Coronary artery disease involving coronary bypass graft of native heart with angina pectoris ([place]) 06/18/2011  . Depression   . DM (diabetes mellitus) ([place]) 11/11/2012  . Fibroid   . Function kidney decreased   . GERD (gastroesophageal reflux disease)   . Hemiplegia, unspecified, affecting nondominant side 11/11/2012  . Hepatitis    Hepatitis A -college yrs"water source  exposure"  . Hiatal hernia   . History of shingles    2-3 yrs ago last out break "around waist"  . History of stomach ulcers   . HTN (hypertension) 11/11/2012  . Hyperlipidemia 07/30/2016  . Hypertension   . ICD (implantable cardiac defibrillator) in place   . Iron deficiency anemia   . Ischemic cardiomyopathy    status post biventricular ICD placed by DR Edumunds who used to see Dr Melvern Banker here to establish  cardiovascular care.  . Migraines   . MVP (mitral valve prolapse)    Antibiotics not required for procedures  . Myocardial infarction ([place])    "I've had 2; the others they were able to catch before completing" (02/14/2013)  . Pacemaker   . Paroxysmal SVT (supraventricular tachycardia) ([place]) 06/18/2011  . Pneumonia 1950's & 1985  . Shortness of breath    "lying down flat; at times w/exertion" (02/14/2013). 10-06-15 exertion only..  . Stroke [facility])    "2 confirmed; 9 TIA's; results in dragging LLE; numbness in tip of tongue" (02/14/2013),10-06-15 right hand tends to be weaker when tired.  [name] TIA (transient ischemic attack) 11/11/2012  . Type II diabetes mellitus [facility])     Patient Active Problem List   Diagnosis Date Noted  . Hyperlipidemia 07/30/2016  . Chronic diastolic heart failure ([place]) 03/22/2016  . Ischemic cardiomyopathy 01/28/2015  . Mastodynia, female 05/14/2014  . Cerebral infarction ([place]) 11/11/2012  . HTN (hypertension) 11/11/2012  . DM (diabetes mellitus) ([place]) 11/11/2012  .  Chronic renal insufficiency, stage III (moderate) ([place]) 11/11/2012  . TIA (transient ischemic attack) 11/11/2012  . History of shingles   . MVP (mitral valve prolapse)   . Asthma   . Anxiety   . Depression   . Hiatal hernia   . Cervical dysplasia   . Fibroid   . Biventricular implantable cardioverter-defibrillator in situ 06/18/2011  . Paroxysmal SVT (supraventricular tachycardia) ([place]) 06/18/2011  . Coronary artery disease involving coronary bypass graft of native heart with angina pectoris  ([place]) 06/18/2011    Past Surgical History:  Procedure Laterality Date  . ABDOMINAL HYSTERECTOMY  1985   TAH   . BIV ICD GENERTAOR CHANGE OUT  06/2007; 04/2008   "2 lead initial placement, at [facility]; done at [facility], after developing CHF" (02/14/2013)  . BIV PACEMAKER GENERATOR CHANGE OUT N/A 01/29/2015   Procedure: BIV PACEMAKER GENERATOR CHANGE OUT;  Surgeon: Evans Lance, MD;  Location: [facility] CATH LAB;  Service: Cardiovascular;  Laterality: N/A;  . BREAST EXCISIONAL BIOPSY Left 01/2007; 06/2007; 03/2008   "benign" (02/14/2013)  . BREAST SURGERY    . CARDIAC CATHETERIZATION     "probably in the teens" (02/14/2013)  . CARDIAC DEFIBRILLATOR PLACEMENT    . COLONOSCOPY  ~ 2002  . COLONOSCOPY WITH PROPOFOL N/A 10/07/2015   Procedure: COLONOSCOPY WITH PROPOFOL;  Surgeon: Juanita Craver, MD;  Location: [facility] ENDOSCOPY;  Service: Endoscopy;  Laterality: N/A;  . CORONARY ANGIOPLASTY WITH STENT PLACEMENT     "started out w/5; bypass corrected some; 1 stent since the bypass" (02/14/2013)  . CORONARY ARTERY BYPASS GRAFT  ` 1998   LIMA-LAD, SVG-D1, SVG-PDA  . [place] OF UTERUS  1975 X 2; 1976; 1977  . INSERT / REPLACE / REMOVE PACEMAKER     biventricular defibrillator--06/10/ 2009  . LEFT HEART CATHETERIZATION WITH CORONARY/GRAFT ANGIOGRAM N/A 01/03/2015   Procedure: LEFT HEART CATHETERIZATION WITH Beatrix Fetters;  Surgeon: Sinclair Grooms, MD;  Location[facility] CATH LAB;  Service: Cardiovascular;  Laterality: N/A;  . SUPRAVENTRICULAR TACHYCARDIA ABLATION  06/2007  . TEE WITHOUT CARDIOVERSION  11/14/2012   Procedure: TRANSESOPHAGEAL ECHOCARDIOGRAM (TEE);  Surgeon: Candee Furbish, MD;  Location[facility] ENDOSCOPY;  Service: Cardiovascular;  Laterality: N/A;     OB History    Gravida  7   Para  2   Term      Preterm  2   AB  5   Living  1     SAB      TAB      Ectopic      Multiple      Live Births               Home Medications    Prior to Admission medications   Medication Sig  Start Date End Date Taking? Authorizing Provider  albuterol (PROVENTIL HFA;VENTOLIN HFA) 108 (90 BASE) MCG/ACT inhaler Inhale 2 puffs into the lungs every 4 (four) hours as needed for wheezing or shortness of breath.    [provider]  ALPRAZolam Duanne Moron) 0.5 MG tablet Take 0.5-1 tablets by mouth 3 (three) times daily. 1 mg in the morning then 0.5 mg at noon then 0.5 mg at bedtime 09/09/15   [provider]  Artificial Tear GEL Place 2 drops into both eyes daily as needed (dry eyes).     [provider]  aspirin 325 MG tablet Take 325 mg by mouth daily.    [provider]  BIOTIN PO Take by mouth daily.    [provider]  cetirizine (ZYRTEC) 10 MG tablet Take 10 mg by mouth daily.    [provider]  Cholecalciferol (VITAMIN D-3) 1000 UNITS CAPS Take 1 capsule by mouth daily.     [provider]  clopidogrel (PLAVIX) 75 MG tablet Take 1 tablet (75 mg total) by mouth daily. 01/05/18   Belva Crome, MD  cyclobenzaprine (FLEXERIL) 5 MG tablet Take 1 tablet (5 mg total) by mouth at bedtime as needed for muscle spasms. 01/04/16   Oswald Hillock, MD  doxazosin (CARDURA) 4 MG tablet Take 4 mg by mouth at bedtime.     [provider]  ezetimibe (ZETIA) 10 MG tablet Take 1 tablet (10 mg total) by mouth daily. 01/05/18   Belva Crome, MD  fluticasone [facility]) 50 MCG/ACT nasal spray Place 2 sprays into both nostrils daily as needed for allergies or rhinitis.     [provider]  furosemide (LASIX) 80 MG tablet Take two (2) tablets (160 mg) by mouth daily. Take one (1) additional tablet (80 mg) by mouth daily as needed for swelling.    [provider]  insulin glargine (LANTUS) 100 UNIT/ML injection Inject 18 Units into the skin at bedtime.     [provider]  isosorbide mononitrate (IMDUR) 60 MG 24 hr tablet Take 1 tablet (60 mg total) by mouth daily. 01/05/18   Belva Crome, MD  metoprolol (TOPROL-XL) 200 MG  24 hr tablet Take 200 mg by mouth daily.    [provider]  Multiple Vitamin (MULTIVITAMIN WITH MINERALS) TABS Take 1 tablet by mouth daily.    [provider]  NITROSTAT 0.4 MG SL tablet Place 1 tablet under the tongue every 5 minutes as needed for chest pain, max 3 doses, go to er if no relief 11/18/14   Belva Crome, MD  olmesartan (BENICAR) 20 MG tablet Take 1 tablet (20 mg total) by mouth daily. 01/05/18   Belva Crome, MD  ondansetron (ZOFRAN) 8 MG tablet Take 8 mg by mouth every 8 (eight) hours as needed for nausea or vomiting.  03/30/17   [provider]  pantoprazole (PROTONIX) 40 MG tablet Take 40 mg by mouth daily.    [provider]  potassium chloride (K-DUR,KLOR-CON) 10 MEQ tablet TAKE 2 TABLETS BY MOUTH TWO TIMES DAILY 01/05/18   Belva Crome, MD  Riboflavin 100 MG TABS Take 2 tablets (200 mg total) by mouth daily. Patient not taking: Reported on 08/02/2018 01/04/16   Oswald Hillock, MD  simvastatin (ZOCOR) 20 MG tablet Take 1 tablet (20 mg total) by mouth at bedtime. 01/05/18   Belva Crome, MD  sodium chloride (MURO 128) 5 % ophthalmic ointment Place 1 drop into both eyes at bedtime. For dry eyes    [provider]  traMADol (ULTRAM) 50 MG tablet Take 100 mg by mouth every 6 (six) hours as needed (MIGRAINES).    [provider]  venlafaxine (EFFEXOR) 100 MG tablet Take 100-200 mg by mouth See admin instructions. 200 mg in the morning then 100 mg at noon 04/12/11   [provider]  vitamin A 7500 UNIT capsule Take 2,400 Units by mouth daily.    [provider]    Family History Family History  Problem Relation Age of Onset  . Heart disease Mother   . Hypertension Mother   . Heart attack Mother   . Stroke Mother   . Heart disease Father   . Hypertension Father   .  Diabetes Father   . Kidney failure Father   . Heart attack Father   . Stroke Father   . Heart disease Brother   . Diabetes Brother   .  Kidney failure Brother   . Heart attack Brother   . Diabetes Paternal Grandmother     Social History Social History   Tobacco Use  . Smoking status: Never Smoker  . Smokeless tobacco: Never Used  Substance Use Topics  . Alcohol use: No    Alcohol/week: 0.0 standard drinks  . Drug use: No     Allergies   Calcium channel blockers; Codeine; Lyrica [pregabalin]; Other; Ramipril; Ticlid [ticlopidine hcl]; Morphine and related; Tape; Digoxin and related; Metolazone; Myrbetriq [mirabegron]; Onglyza [saxagliptin]; Penicillins; Spironolactone; Sulfa antibiotics; Tetracyclines & related; Vicodin [hydrocodone-acetaminophen]; and Latex   Review of Systems Review of Systems  Constitutional: Negative for chills and fever.  HENT: Negative for sore throat and trouble swallowing.   Eyes: Negative for visual disturbance.  Respiratory: Negative for cough, chest tightness and shortness of breath.   Cardiovascular: Positive for chest pain. Negative for palpitations and leg swelling.  Gastrointestinal: Negative for abdominal pain, constipation, diarrhea, nausea and vomiting.  Genitourinary: Negative for dysuria and frequency.  Musculoskeletal: Positive for myalgias. Negative for back pain, neck pain and neck stiffness.  Skin: Negative for rash and wound.  Neurological: Negative for dizziness, syncope, weakness, numbness and headaches.  All other systems reviewed and are negative.    Physical Exam Updated Vital Signs BP (!) 143/87   Pulse 71   Temp 98.1 F (36.7 C) (Oral)   Resp 18   Ht 5' 4.5" (1.638 m)   Wt 79 kg   SpO2 100%   BMI 29.43 kg/m   Physical Exam  Constitutional: She is oriented to person, place, and time. She appears well-developed and well-nourished. No distress.  HENT:  Head: Normocephalic and atraumatic.  Mouth/Throat: Oropharynx is clear and moist. No oropharyngeal exudate.  Eyes: Pupils are equal, round, and reactive to light. EOM are normal.  Neck: Normal range  of motion. Neck supple. No JVD present.  Cardiovascular: Normal rate and regular rhythm. Exam reveals no gallop and no friction rub.  No murmur heard. Pulmonary/Chest: Effort normal and breath sounds normal. No stridor. No respiratory distress. She has no wheezes. She has no rales. She exhibits tenderness.  Defibrillator in left anterior chest wall.  Mild tenderness to palpation.  No erythema or warmth.  Abdominal: Soft. Bowel sounds are normal. There is no tenderness. There is no rebound and no guarding.  Musculoskeletal: Normal range of motion. She exhibits no edema or tenderness.  No lower extremity swelling, asymmetry or tenderness.  Distal pulses intact.  Lymphadenopathy:    She has no cervical adenopathy.  Neurological: She is alert and oriented to person, place, and time.  Moves all extremities without focal deficit.  Sensation fully intact.  Skin: Skin is warm and dry. Capillary refill takes less than 2 seconds. No rash noted. She is not diaphoretic. No erythema.  Psychiatric: She has a normal mood and affect. Her behavior is normal.  Nursing note and vitals reviewed.    ED Treatments / Results  Labs (all labs ordered are listed, but only abnormal results are displayed) Labs Reviewed  BASIC METABOLIC PANEL - Abnormal; Notable for the following components:      Result Value   Glucose, Bld 179 (*)    Creatinine, Ser 2.08 (*)    GFR calc non Af Amer 24 (*)  GFR calc Af Amer 28 (*)    All other components within normal limits  CBC - Abnormal; Notable for the following components:   RBC 3.52 (*)    Hemoglobin 10.4 (*)    HCT 32.4 (*)    All other components within normal limits  I-STAT TROPONIN, ED    EKG EKG Interpretation  Date/Time:  Wednesday August 02 2018 16:30:21 EDT Ventricular Rate:  71 PR Interval:  140 QRS Duration: 154 QT Interval:  458 QTC Calculation: 497 R Axis:   12 Text Interpretation:  Atrial-sensed ventricular-paced rhythm Abnormal ECG  Confirmed by Julianne Rice [phone]) on 08/02/2018 5:42:41 PM   Radiology Dg Chest 2 View  Result Date: 08/02/2018 CLINICAL DATA:  Chest pain after exertion EXAM: CHEST - 2 VIEW COMPARISON:  12/12/2017 FINDINGS: Stable appearance of the chest with top-normal size heart and mild aortic atherosclerosis. ICD device projects over the left mid lung with leads in the right atrium, coronary sinus and right ventricle. Lungs are clear. No acute osseous abnormality. IMPRESSION: Stable appearance of the chest.  No active cardiopulmonary disease. Electronically Signed   By: Ashley Royalty M.D.   On: 08/02/2018 18:25    Procedures Procedures (including critical care time)  Medications Ordered in ED Medications  aspirin chewable tablet 324 mg (324 mg Oral Refused 08/02/18 1852)     Initial Impression / Assessment and Plan / ED Course  I have reviewed the triage vital signs and the nursing notes.  Pertinent labs & imaging results that were available during my care of the patient were reviewed by me and considered in my medical decision making (see chart for details).     Discussed with Dr. Burt Knack.  Cardiology will evaluate in the emergency department for disposition. Cardiology will admit Final Clinical Impressions(s) / ED Diagnoses   Final diagnoses:  Angina of effort [facility])    ED Discharge Orders    None       Julianne Rice, MD 08/02/18 1859

## 2018-08-02 NOTE — ED Notes (Signed)
Attempted report x1. 

## 2018-08-02 NOTE — H&P (Signed)
Cardiology Admission History and Physical:   Patient ID: [name] MRN: [id number]; DOB: 02/19/1951   Admission date: 08/02/2018  Primary Care Provider: [name], MD Primary Cardiologist: Sinclair Grooms, MD  Primary Electrophysiologist:  None   Chief Complaint:  Chest pain  Patient Profile:   [name] is a 66 y.o. female with an extensive cardiac history including CABG in 1998, severe ischemic cardiomyopathy with Biventricular ICD, presenting with chest pain on exertion  History of Present Illness:   [name] was in her normal state of health today when she went to the maintenance program of cardiac rehab. During exercise, she developed left sided chest pain that she describes as a 'sharp pain' radiating to the left shoulder. She also had weakness and diaphoresis. She slowed down but chest pain recurred. With further rest the chest pain resolved after a period of approximately 20 minutes. She continued to feel weak and diaphoretic. Symptoms have now fully resolved but she feels 'washed out.' she denies recent problems with shortness of breath, edema, orthopnea, or PND. No ICD discharges.     Past Medical History:  Diagnosis Date  . AICD (automatic cardioverter/defibrillator) present    [employer]- Dr. Beckie Salts follows  . Anginal pain ([place])   . Anxiety   . Asthma   . Asthma   . Back pain    "pinched nerve-lower back" - Dr. Nelva Bush follows.  . Biventricular implantable cardioverter-defibrillator in situ 06/18/2011  . Cerebral infarction ([place]) 11/11/2012  . Cervical dysplasia   . CHF (congestive heart failure) ([place])   . Chronic diastolic heart failure ([place]) 03/22/2016  . Chronic kidney disease    Dr. Posey Pronto follows.  . Chronic renal insufficiency, stage III (moderate) ([place]) 11/11/2012  . Complication of anesthesia    "I wake up during surgeries" (02/14/2013)  . Coronary artery disease   . Coronary artery disease involving coronary bypass graft of  native heart with angina pectoris ([place]) 06/18/2011  . Depression   . DM (diabetes mellitus) ([place]) 11/11/2012  . Fibroid   . Function kidney decreased   . GERD (gastroesophageal reflux disease)   . Hemiplegia, unspecified, affecting nondominant side 11/11/2012  . Hepatitis    Hepatitis A -college yrs"water source exposure"  . Hiatal hernia   . History of shingles    2-3 yrs ago last out break "around waist"  . History of stomach ulcers   . HTN (hypertension) 11/11/2012  . Hyperlipidemia 07/30/2016  . Hypertension   . ICD (implantable cardiac defibrillator) in place   . Iron deficiency anemia   . Ischemic cardiomyopathy    status post biventricular ICD placed by DR Edumunds who used to see Dr Melvern Banker here to establish  cardiovascular care.  . Migraines   . MVP (mitral valve prolapse)    Antibiotics not required for procedures  . Myocardial infarction ([place])    "I've had 2; the others they were able to catch before completing" (02/14/2013)  . Pacemaker   . Paroxysmal SVT (supraventricular tachycardia) ([place]) 06/18/2011  . Pneumonia 1950's & 1985  . Shortness of breath    "lying down flat; at times w/exertion" (02/14/2013). 10-06-15 exertion only..  . Stroke [facility])    "2 confirmed; 9 TIA's; results in dragging LLE; numbness in tip of tongue" (02/14/2013),10-06-15 right hand tends to be weaker when tired.  [name] TIA (transient ischemic attack) 11/11/2012  . Type II diabetes mellitus ([place])     Past Surgical History:  Procedure Laterality Date  . ABDOMINAL  HYSTERECTOMY  1985   TAH   . BIV ICD GENERTAOR CHANGE OUT  06/2007; 04/2008   "2 lead initial placement, at [facility]; done at [facility], after developing CHF" (02/14/2013)  . BIV PACEMAKER GENERATOR CHANGE OUT N/A 01/29/2015   Procedure: BIV PACEMAKER GENERATOR CHANGE OUT;  Surgeon: Evans Lance, MD;  Location: [facility] CATH LAB;  Service: Cardiovascular;  Laterality: N/A;  . BREAST EXCISIONAL BIOPSY Left 01/2007; 06/2007; 03/2008   "benign" (02/14/2013)  . BREAST SURGERY      . CARDIAC CATHETERIZATION     "probably in the teens" (02/14/2013)  . CARDIAC DEFIBRILLATOR PLACEMENT    . COLONOSCOPY  ~ 2002  . COLONOSCOPY WITH PROPOFOL N/A 10/07/2015   Procedure: COLONOSCOPY WITH PROPOFOL;  Surgeon: Juanita Craver, MD;  Location: [facility] ENDOSCOPY;  Service: Endoscopy;  Laterality: N/A;  . CORONARY ANGIOPLASTY WITH STENT PLACEMENT     "started out w/5; bypass corrected some; 1 stent since the bypass" (02/14/2013)  . CORONARY ARTERY BYPASS GRAFT  ` 1998   LIMA-LAD, SVG-D1, SVG-PDA  . [place] OF UTERUS  1975 X 2; 1976; 1977  . INSERT / REPLACE / REMOVE PACEMAKER     biventricular defibrillator--06/10/ 2009  . LEFT HEART CATHETERIZATION WITH CORONARY/GRAFT ANGIOGRAM N/A 01/03/2015   Procedure: LEFT HEART CATHETERIZATION WITH Beatrix Fetters;  Surgeon: Sinclair Grooms, MD;  Location: [facility] CATH LAB;  Service: Cardiovascular;  Laterality: N/A;  . SUPRAVENTRICULAR TACHYCARDIA ABLATION  06/2007  . TEE WITHOUT CARDIOVERSION  11/14/2012   Procedure: TRANSESOPHAGEAL ECHOCARDIOGRAM (TEE);  Surgeon: Candee Furbish, MD;  Location: [facility] ENDOSCOPY;  Service: Cardiovascular;  Laterality: N/A;     Medications Prior to Admission: Prior to Admission medications   Medication Sig Start Date End Date Taking? Authorizing Provider  albuterol (PROVENTIL HFA;VENTOLIN HFA) 108 (90 BASE) MCG/ACT inhaler Inhale 2 puffs into the lungs every 4 (four) hours as needed for wheezing or shortness of breath.   Yes [provider]  ALPRAZolam Duanne Moron) 0.5 MG tablet Take 0.5-1 tablets by mouth 3 (three) times daily. 1 mg in the morning then 0.5 mg at noon then 0.5 mg at bedtime 09/09/15  Yes [provider]  Artificial Tear GEL Place 2 drops into both eyes daily as needed (dry eyes).    Yes [provider]  aspirin 325 MG tablet Take 325 mg by mouth daily.   Yes [provider]  BIOTIN PO Take by mouth daily.   Yes [provider]  cetirizine (ZYRTEC) 10 MG  tablet Take 10 mg by mouth daily.   Yes [provider]  Cholecalciferol (VITAMIN D-3) 1000 UNITS CAPS Take 1 capsule by mouth daily.    Yes [provider]  clopidogrel (PLAVIX) 75 MG tablet Take 1 tablet (75 mg total) by mouth daily. 01/05/18  Yes Belva Crome, MD  cyclobenzaprine (FLEXERIL) 5 MG tablet Take 1 tablet (5 mg total) by mouth at bedtime as needed for muscle spasms. 01/04/16  Yes Oswald Hillock, MD  doxazosin (CARDURA) 4 MG tablet Take 4 mg by mouth at bedtime.    Yes [provider]  ezetimibe (ZETIA) 10 MG tablet Take 1 tablet (10 mg total) by mouth daily. 01/05/18  Yes Belva Crome, MD  fluticasone [facility]) 50 MCG/ACT nasal spray Place 2 sprays into both nostrils daily as needed for allergies or rhinitis.    Yes [provider]  furosemide (LASIX) 80 MG tablet Take two (2) tablets (160 mg) by mouth daily. Take one (1) additional  tablet (80 mg) by mouth daily as needed for swelling.   Yes [provider]  insulin glargine (LANTUS) 100 UNIT/ML injection Inject 18 Units into the skin at bedtime.    Yes [provider]  isosorbide mononitrate (IMDUR) 60 MG 24 hr tablet Take 1 tablet (60 mg total) by mouth daily. 01/05/18  Yes Belva Crome, MD  metoprolol (TOPROL-XL) 200 MG 24 hr tablet Take 200 mg by mouth daily.   Yes [provider]  Multiple Vitamin (MULTIVITAMIN WITH MINERALS) TABS Take 1 tablet by mouth daily.   Yes [provider]  NITROSTAT 0.4 MG SL tablet Place 1 tablet under the tongue every 5 minutes as needed for chest pain, max 3 doses, go to er if no relief 11/18/14  Yes Belva Crome, MD  olmesartan (BENICAR) 20 MG tablet Take 1 tablet (20 mg total) by mouth daily. 01/05/18  Yes Belva Crome, MD  ondansetron (ZOFRAN) 8 MG tablet Take 8 mg by mouth every 8 (eight) hours as needed for nausea or vomiting.  03/30/17  Yes [provider]  pantoprazole (PROTONIX) 40 MG tablet Take 40 mg by mouth  daily.   Yes [provider]  potassium chloride (K-DUR,KLOR-CON) 10 MEQ tablet TAKE 2 TABLETS BY MOUTH TWO TIMES DAILY 01/05/18  Yes Belva Crome, MD  Riboflavin 100 MG TABS Take 2 tablets (200 mg total) by mouth daily. 01/04/16  Yes Oswald Hillock, MD  simvastatin (ZOCOR) 20 MG tablet Take 1 tablet (20 mg total) by mouth at bedtime. 01/05/18  Yes Belva Crome, MD  sodium chloride (MURO 128) 5 % ophthalmic ointment Place 1 drop into both eyes at bedtime. For dry eyes   Yes [provider]  traMADol (ULTRAM) 50 MG tablet Take 100 mg by mouth every 6 (six) hours as needed (MIGRAINES).   Yes [provider]  venlafaxine (EFFEXOR) 100 MG tablet Take 100-200 mg by mouth See admin instructions. 200 mg in the morning then 100 mg at noon 04/12/11  Yes [provider]  vitamin A 7500 UNIT capsule Take 2,400 Units by mouth daily.   Yes [provider]     Allergies:    Allergies  Allergen Reactions  . Calcium Channel Blockers Other (See Comments)    Cannot take non-DHP CCBs (diltiazem, verapamil) due to CHF requiring ICD  . Codeine Anaphylaxis  . Lyrica [Pregabalin]     Nightmares, and swelling  . Other Anaphylaxis    Walnuts, pecans, and ANY melons PATIENT IS A VEGAN  . Ramipril Anaphylaxis and Swelling  . Ticlid [Ticlopidine Hcl] Other (See Comments)    Unprovoked bleeding while on Ticlid (doesn't think she was on ASA at the time) Takes Plavix without problems  . Morphine And Related Other (See Comments)    Severe AMS, confusion, weakness Tolerates Fentanyl, Tramadol  . Tape Dermatitis and Rash    Tolerates paper tape  . Digoxin And Related Nausea Only    Unknown  . Metolazone Other (See Comments)    Cannot remember reaction  . Myrbetriq [Mirabegron] Other (See Comments)    Aggravates migraines  . Onglyza [Saxagliptin] Other (See Comments)    Exacerbates migraines  . Penicillins Hives    Has patient had a PCN reaction causing immediate rash,  facial/tongue/throat swelling, SOB or lightheadedness with hypotension: Yes Has patient had a PCN reaction causing severe rash involving mucus membranes or skin necrosis: Unknown Has patient had a PCN reaction that required hospitalization: Unknown Has  patient had a PCN reaction occurring within the last 10 years: No If all of the above answers are "NO", then may proceed with Cephalosporin use.   [name] Spironolactone Other (See Comments)    Cannot remember reaction  . Sulfa Antibiotics Hives and Other (See Comments)  . Tetracyclines & Related Other (See Comments)    Cannot remember reaction Tolerates macrolides (azithromycin)  . Vicodin [Hydrocodone-Acetaminophen] Other (See Comments)    EXTREME LETHARGY  . Latex Itching and Rash    Social History:   Social History   Socioeconomic History  . Marital status: Married    Spouse name: [name]  . Number of children: 1  . Years of education: MA  . Highest education level: Not on file  Occupational History    Employer: UNEMPLOYED    Comment: Disablity  Social Needs  . Financial resource strain: Not on file  . Food insecurity:    Worry: Not on file    Inability: Not on file  . Transportation needs:    Medical: Not on file    Non-medical: Not on file  Tobacco Use  . Smoking status: Never Smoker  . Smokeless tobacco: Never Used  Substance and Sexual Activity  . Alcohol use: No    Alcohol/week: 0.0 standard drinks  . Drug use: No  . Sexual activity: Yes    Birth control/protection: Surgical  Lifestyle  . Physical activity:    Days per week: Not on file    Minutes per session: Not on file  . Stress: Not on file  Relationships  . Social connections:    Talks on phone: Not on file    Gets together: Not on file    Attends religious service: Not on file    Active member of club or organization: Not on file    Attends meetings of clubs or organizations: Not on file    Relationship status: Not on file  . Intimate partner violence:      Fear of current or ex partner: Not on file    Emotionally abused: Not on file    Physically abused: Not on file    Forced sexual activity: Not on file  Other Topics Concern  . Not on file  Social History Narrative   Patient lives at home with spouse.     Daughter name is Tourist information centre manager.   Caffeine Use: none    Family History:   The patient's family history includes Diabetes in her brother, father, and paternal grandmother; Heart attack in her brother, father, and mother; Heart disease in her brother, father, and mother; Hypertension in her father and mother; Kidney failure in her brother and father; Stroke in her father and mother.    ROS:  Please see the history of present illness.  All other ROS reviewed and negative.     Physical Exam/Data:   Vitals:   08/02/18 1815 08/02/18 1830 08/02/18 1845 08/02/18 1900  BP: 139/87 (!) 129/96 (!) 143/87 (!) 151/92  Pulse: 65 72 71 68  Resp: 16 18 18 16   Temp:      TempSrc:      SpO2: 97% 100% 100% 100%  Weight:      Height:       No intake or output data in the 24 hours ending 08/02/18 1925 Filed Weights   08/02/18 1625  Weight: 79 kg   Body mass index is 29.43 kg/m.  General:  Well nourished, well developed, in no acute distress HEENT: normal Lymph: no adenopathy Neck:  no JVD Endocrine:  No thryomegaly Vascular: No carotid bruits; FA pulses 2+ bilaterally  Cardiac:  normal S1, S2; RRR; no murmur  Lungs:  clear to auscultation bilaterally, no wheezing, rhonchi or rales  Abd: soft, nontender, no hepatomegaly  Ext: no edema Musculoskeletal:  No deformities, BUE and BLE strength normal and equal Skin: warm and dry  Neuro:  CNs 2-12 intact, no focal abnormalities noted Psych:  Normal affect    EKG:  The ECG that was done tonight was personally reviewed and demonstrates atrial sensed ventricular paced rhythm (Biventricular morphology)  Laboratory Data:  Chemistry Recent Labs  Lab 08/02/18 1650  NA 140  K 4.2  CL 100   CO2 29  GLUCOSE 179*  BUN 10  CREATININE 2.08*  CALCIUM 8.9  GFRNONAA 24*  GFRAA 28*  ANIONGAP 11    No results for input(s): PROT, ALBUMIN, AST, ALT, ALKPHOS, BILITOT in the last 168 hours. Hematology Recent Labs  Lab 08/02/18 1650  WBC 6.6  RBC 3.52*  HGB 10.4*  HCT 32.4*  MCV 92.0  MCH 29.5  MCHC 32.1  RDW 13.0  PLT 174   Cardiac EnzymesNo results for input(s): TROPONINI in the last 168 hours.  Recent Labs  Lab 08/02/18 1704  TROPIPOC 0.00    BNPNo results for input(s): BNP, PROBNP in the last 168 hours.  DDimer No results for input(s): DDIMER in the last 168 hours.  Radiology/Studies:  Dg Chest 2 View  Result Date: 08/02/2018 CLINICAL DATA:  Chest pain after exertion EXAM: CHEST - 2 VIEW COMPARISON:  12/12/2017 FINDINGS: Stable appearance of the chest with top-normal size heart and mild aortic atherosclerosis. ICD device projects over the left mid lung with leads in the right atrium, coronary sinus and right ventricle. Lungs are clear. No acute osseous abnormality. IMPRESSION: Stable appearance of the chest.  No active cardiopulmonary disease. Electronically Signed   By: Ashley Royalty M.D.   On: 08/02/2018 18:25    Assessment and Plan:   1. Chest pain with intermediate risk of ACS 2. Known CAD s/p remote CABG with ischemic cardiomyopathy and chronic systolic heart failure 3. HTN, controlled 4. Hyperlipidemia 5. S/P ICD  The patient presents with worrisome symptoms of exertional chest pain, diaphoresis, and weakness. Fortunately symptoms resolved with rest and have not recurred. Troponin is negative, CXR is clear, and EKG is paced thus unable to interpret for ischemic changes. I have recommended observation admission with serial cardiac markers. IV heparin only if recurrent pain at rest or positive enzymes, and a Lexiscan Myoview stress test tomorrow. The patient understands the plan. She will be continued on her home medical therapy. Consider invasive evaluation  with catheterization only if significant ischemia on stress test, positive enzymes, or recurrent angina.   Severity of Illness: The appropriate patient status for this patient is OBSERVATION. Observation status is judged to be reasonable and necessary in order to provide the required intensity of service to ensure the patient's safety. The patient's presenting symptoms, physical exam findings, and initial radiographic and laboratory data in the context of their medical condition is felt to place them at decreased risk for further clinical deterioration. Furthermore, it is anticipated that the patient will be medically stable for discharge from the hospital within 2 midnights of admission. The following factors support the patient status of observation.   For questions or updates, please contact [place] Please consult [email or web address] for contact info under        Signed, Sherren Mocha, MD  08/02/2018  7:25 PM

## 2018-08-02 NOTE — ED Triage Notes (Signed)
Pt presents with CP after exertion at the cardiac rehab class that started at 3p; pt also having dizziness and lightheadedness with the pain

## 2018-08-02 NOTE — ED Notes (Signed)
Cardiac rehab mon wed fri - reports onset of left-sided chest pain in rehab today; states pain rad into left shoulder; states sat to rest which was when pain subsided; additionally had h/a and dizziness which have also since resolved; had EKG done which was nondiagnostic - presently pain free

## 2018-08-02 NOTE — Telephone Encounter (Signed)
[name] with cardiac rehab called. Patient developed 8/10 chest pain while exercising. She dropped intensity down but CP persisted. It did go away with rest but she had associated arm pain as well. EKG shows paced rhythm so difficult to assess for ischemia. Office would not be able to accomodate getting in for OV this late in the day, therefore advised she proceed to ER given new onset CP. Verdis Frederickson verbalized understanding and will plan to take pt. Hollyann Pablo PA-C

## 2018-08-03 ENCOUNTER — Observation Stay (HOSPITAL_BASED_OUTPATIENT_CLINIC_OR_DEPARTMENT_OTHER): Payer: Medicare HMO

## 2018-08-03 DIAGNOSIS — E785 Hyperlipidemia, unspecified: Secondary | ICD-10-CM

## 2018-08-03 DIAGNOSIS — N184 Chronic kidney disease, stage 4 (severe): Secondary | ICD-10-CM | POA: Diagnosis not present

## 2018-08-03 DIAGNOSIS — I130 Hypertensive heart and chronic kidney disease with heart failure and stage 1 through stage 4 chronic kidney disease, or unspecified chronic kidney disease: Secondary | ICD-10-CM | POA: Diagnosis not present

## 2018-08-03 DIAGNOSIS — R9439 Abnormal result of other cardiovascular function study: Secondary | ICD-10-CM

## 2018-08-03 DIAGNOSIS — I10 Essential (primary) hypertension: Secondary | ICD-10-CM | POA: Diagnosis not present

## 2018-08-03 DIAGNOSIS — I25118 Atherosclerotic heart disease of native coronary artery with other forms of angina pectoris: Secondary | ICD-10-CM | POA: Diagnosis not present

## 2018-08-03 DIAGNOSIS — I208 Other forms of angina pectoris: Secondary | ICD-10-CM | POA: Diagnosis not present

## 2018-08-03 DIAGNOSIS — Z794 Long term (current) use of insulin: Secondary | ICD-10-CM

## 2018-08-03 DIAGNOSIS — R079 Chest pain, unspecified: Secondary | ICD-10-CM | POA: Diagnosis not present

## 2018-08-03 DIAGNOSIS — I25119 Atherosclerotic heart disease of native coronary artery with unspecified angina pectoris: Secondary | ICD-10-CM | POA: Diagnosis not present

## 2018-08-03 DIAGNOSIS — I255 Ischemic cardiomyopathy: Secondary | ICD-10-CM | POA: Diagnosis not present

## 2018-08-03 DIAGNOSIS — Z23 Encounter for immunization: Secondary | ICD-10-CM | POA: Diagnosis not present

## 2018-08-03 DIAGNOSIS — E1169 Type 2 diabetes mellitus with other specified complication: Secondary | ICD-10-CM

## 2018-08-03 DIAGNOSIS — E1159 Type 2 diabetes mellitus with other circulatory complications: Secondary | ICD-10-CM | POA: Diagnosis not present

## 2018-08-03 DIAGNOSIS — I471 Supraventricular tachycardia: Secondary | ICD-10-CM | POA: Diagnosis not present

## 2018-08-03 DIAGNOSIS — E1122 Type 2 diabetes mellitus with diabetic chronic kidney disease: Secondary | ICD-10-CM | POA: Diagnosis not present

## 2018-08-03 DIAGNOSIS — I2582 Chronic total occlusion of coronary artery: Secondary | ICD-10-CM | POA: Diagnosis not present

## 2018-08-03 DIAGNOSIS — I5032 Chronic diastolic (congestive) heart failure: Secondary | ICD-10-CM | POA: Diagnosis not present

## 2018-08-03 LAB — NM MYOCAR MULTI W/SPECT W/WALL MOTION / EF
CHL CUP RESTING HR STRESS: 65 {beats}/min
CSEPEW: 1 METS
CSEPPHR: 90 {beats}/min
MPHR: 155 {beats}/min
Percent HR: 58 %

## 2018-08-03 LAB — BASIC METABOLIC PANEL
Anion gap: 8 (ref 5–15)
BUN: 11 mg/dL (ref 8–23)
CHLORIDE: 103 mmol/L (ref 98–111)
CO2: 30 mmol/L (ref 22–32)
CREATININE: 2 mg/dL — AB (ref 0.44–1.00)
Calcium: 8.7 mg/dL — ABNORMAL LOW (ref 8.9–10.3)
GFR calc Af Amer: 29 mL/min — ABNORMAL LOW (ref >60)
GFR, EST NON AFRICAN AMERICAN: 25 mL/min — AB (ref >60)
Glucose, Bld: 129 mg/dL — ABNORMAL HIGH (ref 70–99)
Potassium: 3.5 mmol/L (ref 3.5–5.1)
SODIUM: 141 mmol/L (ref 135–145)

## 2018-08-03 LAB — CBC
HCT: 30.9 % — ABNORMAL LOW (ref 36.0–46.0)
Hemoglobin: 10.1 g/dL — ABNORMAL LOW (ref 12.0–15.0)
MCH: 29.5 pg (ref 26.0–34.0)
MCHC: 32.7 g/dL (ref 30.0–36.0)
MCV: 90.4 fL (ref 78.0–100.0)
PLATELETS: 163 10*3/uL (ref 150–400)
RBC: 3.42 MIL/uL — ABNORMAL LOW (ref 3.87–5.11)
RDW: 12.9 % (ref 11.5–15.5)
WBC: 7.5 10*3/uL (ref 4.0–10.5)

## 2018-08-03 LAB — TROPONIN I
Troponin I: <0.03 ng/mL (ref <0.03)
Troponin I: <0.03 ng/mL (ref <0.03)

## 2018-08-03 LAB — HIV ANTIBODY (ROUTINE TESTING W REFLEX): HIV SCREEN 4TH GENERATION: NONREACTIVE

## 2018-08-03 LAB — GLUCOSE, CAPILLARY
Glucose-Capillary: 139 mg/dL — ABNORMAL HIGH (ref 70–99)
Glucose-Capillary: 116 mg/dL — ABNORMAL HIGH (ref 70–99)
Glucose-Capillary: 140 mg/dL — ABNORMAL HIGH (ref 70–99)

## 2018-08-03 MED ORDER — REGADENOSON 0.4 MG/5ML IV SOLN
INTRAVENOUS | Status: AC
  Filled 2018-08-03: qty 5

## 2018-08-03 MED ORDER — SODIUM CHLORIDE 0.9% FLUSH: 3.0000 mL | INTRAVENOUS | Status: DC | PRN

## 2018-08-03 MED ORDER — TECHNETIUM TC 99M TETROFOSMIN IV KIT
30.0000 | PACK | Freq: Once | INTRAVENOUS | Status: AC | PRN
  Administered 2018-08-03: 30 via INTRAVENOUS

## 2018-08-03 MED ORDER — SODIUM CHLORIDE 0.9 % WEIGHT BASED INFUSION
1.0000 mL/kg/h | INTRAVENOUS | Status: DC
  Administered 2018-08-04: 1 mL/kg/h via INTRAVENOUS

## 2018-08-03 MED ORDER — TECHNETIUM TC 99M TETROFOSMIN IV KIT
10.0000 | PACK | Freq: Once | INTRAVENOUS | Status: AC | PRN
  Administered 2018-08-03: 10 via INTRAVENOUS

## 2018-08-03 MED ORDER — SODIUM CHLORIDE 0.9% FLUSH
3.0000 mL | Freq: Two times a day (BID) | INTRAVENOUS | Status: DC
  Administered 2018-08-03: 3 mL via INTRAVENOUS

## 2018-08-03 MED ORDER — REGADENOSON 0.4 MG/5ML IV SOLN
0.4000 mg | Freq: Once | INTRAVENOUS | Status: AC
  Administered 2018-08-03: 0.4 mg via INTRAVENOUS
  Filled 2018-08-03: qty 5

## 2018-08-03 MED ORDER — SODIUM CHLORIDE 0.9 % IV SOLN: 250.0000 mL | INTRAVENOUS | Status: DC | PRN

## 2018-08-03 NOTE — Progress Notes (Signed)
Patient off floor for stress test.  

## 2018-08-03 NOTE — Plan of Care (Signed)

## 2018-08-03 NOTE — Progress Notes (Signed)
1 day myoview completed without complication. Pending final result by [employer], Almyra Deforest PA Pager: [phone]

## 2018-08-03 NOTE — Care Management Obs Status (Signed)
[facility] NOTIFICATION   Patient Details  Name: [name] MRN: [id number] Date of Birth: 1951/08/17   Medicare Observation Status Notification Given:  Yes    Bethena Roys, RN 08/03/2018, 3:30 PM

## 2018-08-03 NOTE — Progress Notes (Signed)
Patient c/o of CP, gave 2 doses of Nitro and pain has come down from a 7 to a 3-2.  Placed patient on 4L of O2.  I will keep monitoring patient.

## 2018-08-03 NOTE — Progress Notes (Addendum)
Progress Note  Patient Name: [name] Date of Encounter: 08/03/2018  Primary Cardiologist: Sinclair Grooms, MD   Subjective   Last episode of chest pain was yesterday during cardiac rehab  Inpatient Medications    Scheduled Meds: . ALPRAZolam  0.5 mg Oral 2 times per day  . ALPRAZolam  1 mg Oral Daily  . aspirin  324 mg Oral Once  . aspirin  325 mg Oral Daily  . cholecalciferol  1,000 Units Oral Daily  . clopidogrel  75 mg Oral Daily  . doxazosin  4 mg Oral QHS  . ezetimibe  10 mg Oral Daily  . furosemide  160 mg Oral Daily  . heparin  5,000 Units Subcutaneous Q8H  . Influenza vac split quadrivalent PF  0.5 mL Intramuscular Tomorrow-1000  . insulin aspart  0-9 Units Subcutaneous TID WC  . isosorbide mononitrate  60 mg Oral Daily  . loratadine  10 mg Oral Daily  . metoprolol  200 mg Oral Daily  . multivitamin with minerals  1 tablet Oral Daily  . olmesartan  20 mg Oral Daily  . pantoprazole  40 mg Oral Daily  . pneumococcal 23 valent vaccine  0.5 mL Intramuscular Tomorrow-1000  . polyvinyl alcohol   Both Eyes QHS  . potassium chloride  20 mEq Oral BID  . regadenoson      . regadenoson  0.4 mg Intravenous Once  . simvastatin  20 mg Oral QHS  . sodium chloride flush  3 mL Intravenous Q12H  . venlafaxine  100 mg Oral Q1200  . venlafaxine  200 mg Oral Daily   Continuous Infusions: . sodium chloride     PRN Meds: sodium chloride, albuterol, fluticasone, nitroGLYCERIN, ondansetron, polyvinyl alcohol, sodium chloride flush, traMADol   Vital Signs    Vitals:   08/02/18 2014 08/02/18 2110 08/03/18 0551 08/03/18 0913  BP: (!) 149/94 (!) 145/83 (!) 150/75 (!) 156/92  Pulse: 66 70 66 62  Resp: 18 18 19    Temp:  98.3 F (36.8 C) 98.5 F (36.9 C)   TempSrc:  Oral Oral   SpO2: 98% 99% 96%   Weight:  79.3 kg 79.1 kg   Height:  5' 4.5" (1.638 m)      Intake/Output Summary (Last 24 hours) at 08/03/2018 0941 Last data filed at 08/03/2018 0756 Gross per 24  hour  Intake 3 ml  Output -  Net 3 ml   Filed Weights   08/02/18 1625 08/02/18 2110 08/03/18 0551  Weight: 79 kg 79.3 kg 79.1 kg    Telemetry    Paced rhythm - Personally Reviewed  ECG    Paced rhythm - Personally Reviewed  Physical Exam   GEN: No acute distress.   Neck: No JVD Cardiac: RRR, no murmurs, rubs, or gallops.  Respiratory: Clear to auscultation bilaterally. GI: Soft, nontender, non-distended  MS: No edema; No deformity. Neuro:  Nonfocal  Psych: Normal affect   Labs    Chemistry Recent Labs  Lab 08/02/18 1650 08/02/18 2224 08/03/18 0426  NA 140  --  141  K 4.2  --  3.5  CL 100  --  103  CO2 29  --  30  GLUCOSE 179*  --  129*  BUN 10  --  11  CREATININE 2.08* 1.99* 2.00*  CALCIUM 8.9  --  8.7*  GFRNONAA 24* 25* 25*  GFRAA 28* 29* 29*  ANIONGAP 11  --  8     Hematology Recent Labs  Lab 08/02/18  1650 08/02/18 2224 08/03/18 0426  WBC 6.6 7.5 7.5  RBC 3.52* 3.47* 3.42*  HGB 10.4* 10.5* 10.1*  HCT 32.4* 31.4* 30.9*  MCV 92.0 90.5 90.4  MCH 29.5 30.3 29.5  MCHC 32.1 33.4 32.7  RDW 13.0 12.9 12.9  PLT 174 171 163    Cardiac Enzymes Recent Labs  Lab 08/02/18 2224 08/03/18 0426  TROPONINI <0.03 <0.03    Recent Labs  Lab 08/02/18 1704  TROPIPOC 0.00     BNPNo results for input(s): BNP, PROBNP in the last 168 hours.   DDimer No results for input(s): DDIMER in the last 168 hours.   Radiology    Dg Chest 2 View  Result Date: 08/02/2018 CLINICAL DATA:  Chest pain after exertion EXAM: CHEST - 2 VIEW COMPARISON:  12/12/2017 FINDINGS: Stable appearance of the chest with top-normal size heart and mild aortic atherosclerosis. ICD device projects over the left mid lung with leads in the right atrium, coronary sinus and right ventricle. Lungs are clear. No acute osseous abnormality. IMPRESSION: Stable appearance of the chest.  No active cardiopulmonary disease. Electronically Signed   By: Ashley Royalty M.D.   On: 08/02/2018 18:25     Cardiac Studies   Echo 01/02/2016 LV EF: 45% -   50%  Study Conclusions  - Left ventricle: The cavity size was normal. Systolic function was   mildly reduced. The estimated ejection fraction was in the range   of 45% to 50%. Diffuse hypokinesis. Features are consistent with   a pseudonormal left ventricular filling pattern, with concomitant   abnormal relaxation and increased filling pressure (grade 2   diastolic dysfunction). - Mitral valve: There was moderate regurgitation. - Left atrium: The atrium was moderately dilated. - Right ventricle: Poorly visualized. - Tricuspid valve: There was mild regurgitation.  Patient Profile     67 y.o. female is a 67 y.o. female with an extensive cardiac history including CABG in 1998, severe ischemic cardiomyopathy with Biventricular ICD, presenting with chest pain on exertion during cardiac rehab  Assessment & Plan    1. Chest pain with moderate risk for cardiac etiology: With abnormal Myoview, would be concerning for exertional angina (class II)  - proceed with lexiscan myoview this morning  2. CAD s/p CABG 1998: she is on both 325mg  ASA and plavix. Indication for 325mg  ASA is for CVA, need to discuss with neurology as outpatient if can reduce to 81mg  daily.   3. HTN -has been somewhat labile here with some pressures in the 130s and some the 170s.  Currently on Toprol 200 mg daily as well as Benicar 20 mg.  She is also on Imdur, And doxazosin.. We will follow blood pressures here, and if necessary, could consider adding amlodipine.  4. HLD -was not currently on a statin.  Was on Zetia per primary cardiologist.  Pending results of catheterization, will need to consider additional antilipid therapy  5. ICM s/p ICD: last EF 45-50% in 12/2015 --EF estimated roughly 45 % on Myoview  6. Stage IV CKD: at baseline when compare to Feb 2019.  7. H/o CVA - on ASA 325 mg & Plavix     Signed, Almyra Deforest, PA  08/03/2018, 9:41 AM     I have  seen, examined and evaluated the patient this PM along with Mr. Eulas Post, [place].  After reviewing all the available data and chart, we discussed the patients laboratory, study & physical findings as well as symptoms in detail. I agree with his findings, examination as  well as impression recommendations as per our discussion.    Attending adjustments noted in italics.   I actually saw the patient after the Myoview was done and read.  She is has had at least 2 episodes of chest pain since her stress test today.  Stress test results show EF 44% with suspected prior apical infarct as well as a small area of inducible ischemia in the anterior wall.  There is also mild global hypokinesia with more pronounced hypokinesis in the apex. --  I personally reviewed the images and I do agree that there is a clear difference in her anterior wall imaging on this current study as compared to her prior study in 2017.  As this would indicate LAD (LIMA-LAD versus SVG -diagonal) territory.  Of course it could also be shifting breast attenuation, but given the distribution I think is appropriate to evaluate with a "cardiac catheterization.  We will therefore plan for left heart catheterization with graft angiography tomorrow afternoon (tentatively scheduled with Dr. Angelena Form)   Performing MD: Lauree Chandler, M.D. Procedure: LEFT HEART CATHETERIZATION WITH NATIVE CORONARY AND GRAFT ANGIOGRAPHY with possible PERCUTANEOUS CORONARY INTERVENTION  The procedure with Risks/Benefits/Alternatives and Indications was reviewed with the patient as well as her daughter.  All questions were answered.    Risks / Complications include, but not limited to: Death, MI, CVA/TIA, VF/VT (with defibrillation), Bradycardia (need for temporary pacer placement), contrast induced nephropathy , bleeding / bruising / hematoma / pseudoaneurysm, vascular or coronary injury (with possible emergent CT or Vascular Surgery), adverse medication  reactions, infection.  Additional risks involving the use of radiation with the possibility of radiation burns and cancer were explained in detail.  The patient (and family) voice understanding and agree to proceed.       Glenetta Hew, M.D., M.S. Interventional Cardiologist   Pager # [phone] Phone # [phone] [address] [place], [place] [address]     For questions or updates, please contact [place] Please consult [email or web address] for contact info under

## 2018-08-04 ENCOUNTER — Encounter (HOSPITAL_COMMUNITY): Payer: Medicare HMO

## 2018-08-04 ENCOUNTER — Encounter (HOSPITAL_COMMUNITY)
Admission: EM | Disposition: A | Discharge status: Home / Self Care | Payer: Self-pay | Source: Home / Self Care | Attending: Emergency Medicine

## 2018-08-04 DIAGNOSIS — N184 Chronic kidney disease, stage 4 (severe): Secondary | ICD-10-CM | POA: Diagnosis not present

## 2018-08-04 DIAGNOSIS — I25118 Atherosclerotic heart disease of native coronary artery with other forms of angina pectoris: Secondary | ICD-10-CM | POA: Diagnosis not present

## 2018-08-04 DIAGNOSIS — I10 Essential (primary) hypertension: Secondary | ICD-10-CM | POA: Diagnosis not present

## 2018-08-04 DIAGNOSIS — I255 Ischemic cardiomyopathy: Secondary | ICD-10-CM | POA: Diagnosis not present

## 2018-08-04 DIAGNOSIS — I208 Other forms of angina pectoris: Secondary | ICD-10-CM | POA: Diagnosis not present

## 2018-08-04 DIAGNOSIS — E1159 Type 2 diabetes mellitus with other circulatory complications: Secondary | ICD-10-CM | POA: Diagnosis not present

## 2018-08-04 DIAGNOSIS — E1169 Type 2 diabetes mellitus with other specified complication: Secondary | ICD-10-CM | POA: Diagnosis not present

## 2018-08-04 DIAGNOSIS — R079 Chest pain, unspecified: Secondary | ICD-10-CM | POA: Diagnosis not present

## 2018-08-04 DIAGNOSIS — R9439 Abnormal result of other cardiovascular function study: Secondary | ICD-10-CM | POA: Diagnosis not present

## 2018-08-04 DIAGNOSIS — R072 Precordial pain: Secondary | ICD-10-CM | POA: Diagnosis not present

## 2018-08-04 HISTORY — PX: LEFT HEART CATH AND CORS/GRAFTS ANGIOGRAPHY: CATH118250

## 2018-08-04 LAB — CBC
HCT: 31.2 % — ABNORMAL LOW (ref 36.0–46.0)
Hemoglobin: 10.3 g/dL — ABNORMAL LOW (ref 12.0–15.0)
MCH: 29.7 pg (ref 26.0–34.0)
MCHC: 33 g/dL (ref 30.0–36.0)
MCV: 89.9 fL (ref 78.0–100.0)
Platelets: 156 10*3/uL (ref 150–400)
RBC: 3.47 MIL/uL — ABNORMAL LOW (ref 3.87–5.11)
RDW: 13 % (ref 11.5–15.5)
WBC: 8.2 10*3/uL (ref 4.0–10.5)

## 2018-08-04 LAB — PROTIME-INR
INR: 1.06
Prothrombin Time: 13.7 seconds (ref 11.4–15.2)

## 2018-08-04 LAB — BASIC METABOLIC PANEL
Anion gap: 14 (ref 5–15)
BUN: 13 mg/dL (ref 8–23)
CO2: 26 mmol/L (ref 22–32)
Calcium: 8.9 mg/dL (ref 8.9–10.3)
Chloride: 102 mmol/L (ref 98–111)
Creatinine, Ser: 1.9 mg/dL — ABNORMAL HIGH (ref 0.44–1.00)
GFR calc Af Amer: 31 mL/min — ABNORMAL LOW (ref >60)
GFR calc non Af Amer: 27 mL/min — ABNORMAL LOW (ref >60)
Glucose, Bld: 140 mg/dL — ABNORMAL HIGH (ref 70–99)
Potassium: 4.2 mmol/L (ref 3.5–5.1)
Sodium: 142 mmol/L (ref 135–145)

## 2018-08-04 LAB — GLUCOSE, CAPILLARY
GLUCOSE-CAPILLARY: 125 mg/dL — AB (ref 70–99)
GLUCOSE-CAPILLARY: 171 mg/dL — AB (ref 70–99)
Glucose-Capillary: 100 mg/dL — ABNORMAL HIGH (ref 70–99)
Glucose-Capillary: 106 mg/dL — ABNORMAL HIGH (ref 70–99)
Glucose-Capillary: 93 mg/dL (ref 70–99)

## 2018-08-04 SURGERY — LEFT HEART CATH AND CORS/GRAFTS ANGIOGRAPHY
Anesthesia: LOCAL

## 2018-08-04 MED ORDER — FENTANYL CITRATE (PF) 100 MCG/2ML IJ SOLN
INTRAMUSCULAR | Status: AC
  Filled 2018-08-04: qty 2

## 2018-08-04 MED ORDER — HEPARIN SODIUM (PORCINE) 5000 UNIT/ML IJ SOLN
5000.0000 [IU] | Freq: Three times a day (TID) | INTRAMUSCULAR | Status: DC
  Administered 2018-08-05: 5000 [IU] via SUBCUTANEOUS
  Filled 2018-08-04: qty 1

## 2018-08-04 MED ORDER — IOHEXOL 350 MG/ML SOLN
INTRAVENOUS | Status: DC | PRN
  Administered 2018-08-04: 55 mL via INTRA_ARTERIAL

## 2018-08-04 MED ORDER — HEPARIN (PORCINE) IN NACL 1000-0.9 UT/500ML-% IV SOLN
INTRAVENOUS | Status: DC | PRN
  Administered 2018-08-04 (×2): 500 mL

## 2018-08-04 MED ORDER — MIDAZOLAM HCL 2 MG/2ML IJ SOLN
INTRAMUSCULAR | Status: AC
  Filled 2018-08-04: qty 2

## 2018-08-04 MED ORDER — LIDOCAINE HCL (PF) 1 % IJ SOLN
INTRAMUSCULAR | Status: DC | PRN
  Administered 2018-08-04: 15 mL via INTRADERMAL

## 2018-08-04 MED ORDER — LIDOCAINE HCL (PF) 1 % IJ SOLN
INTRAMUSCULAR | Status: AC
  Filled 2018-08-04: qty 30

## 2018-08-04 MED ORDER — MIDAZOLAM HCL 2 MG/2ML IJ SOLN
INTRAMUSCULAR | Status: DC | PRN
  Administered 2018-08-04 (×2): 1 mg via INTRAVENOUS

## 2018-08-04 MED ORDER — SODIUM CHLORIDE 0.9% FLUSH
3.0000 mL | Freq: Two times a day (BID) | INTRAVENOUS | Status: DC
  Administered 2018-08-04 – 2018-08-05 (×2): 3 mL via INTRAVENOUS

## 2018-08-04 MED ORDER — SODIUM CHLORIDE 0.9% FLUSH: 3.0000 mL | INTRAVENOUS | Status: DC | PRN

## 2018-08-04 MED ORDER — HEPARIN (PORCINE) IN NACL 1000-0.9 UT/500ML-% IV SOLN
INTRAVENOUS | Status: AC
  Filled 2018-08-04: qty 1000

## 2018-08-04 MED ORDER — SODIUM CHLORIDE 0.9 % IV SOLN: 250.0000 mL | INTRAVENOUS | Status: DC | PRN

## 2018-08-04 MED ORDER — FENTANYL CITRATE (PF) 100 MCG/2ML IJ SOLN
INTRAMUSCULAR | Status: DC | PRN
  Administered 2018-08-04 (×2): 25 ug via INTRAVENOUS

## 2018-08-04 MED ORDER — SODIUM CHLORIDE 0.9 % IV SOLN
INTRAVENOUS | Status: AC
  Administered 2018-08-04: 20:00:00 via INTRAVENOUS

## 2018-08-04 SURGICAL SUPPLY — 11 items
CATH INFINITI 5 FR IM (CATHETERS) ×1 IMPLANT
CATH INFINITI 5 FR RCB (CATHETERS) ×2 IMPLANT
CATH INFINITI 5FR MULTPACK ANG (CATHETERS) ×1 IMPLANT
KIT HEART LEFT (KITS) ×2 IMPLANT
PACK CARDIAC CATHETERIZATION (CUSTOM PROCEDURE TRAY) ×2 IMPLANT
SHEATH PINNACLE 5F 10CM (SHEATH) ×1 IMPLANT
SHEATH PROBE COVER 6X72 (BAG) ×1 IMPLANT
TRANSDUCER W/STOPCOCK (MISCELLANEOUS) ×2 IMPLANT
TUBING CIL FLEX 10 FLL-RA (TUBING) ×1 IMPLANT
WIRE EMERALD 3MM-J .035X150CM (WIRE) ×1 IMPLANT
WIRE EMERALD 3MM-J .035X260CM (WIRE) ×1 IMPLANT

## 2018-08-04 NOTE — Progress Notes (Signed)
   08/04/18 1400  Clinical Encounter Type  Visited With Patient and family together  Visit Type Initial  Referral From Other (Comment) (rounding)  Spiritual Encounters  Spiritual Needs Emotional   Responded to Epic consult for AD.  Met w/ pt, husband, and two other relatives in room.  Provided pt w/ AD packet, began going through, then employee came to transport to surgery.  Pt opted to wait until later to complete the form.  Feel free to enter another consult if pt desires to complete before d/c (M-F, 9a-3p unless urgent situation).  Myra Gianotti resident, [phone]

## 2018-08-04 NOTE — Progress Notes (Signed)
Site area: Right  Groin a 5 french  Arterial sheath was removed by Alfreados Acie Fredrickson, RRT  Site Prior to Removal:  Level 0  Pressure Applied For 20 MINUTES    Bedrest Beginning at  1530p  Manual:   Yes.    Patient Status During Pull:  stable  Post Pull Groin Site:  Level 0  Post Pull Instructions Given:  Yes.    Post Pull Pulses Present:  Yes.    Dressing Applied:  Yes.    Comments:  VS remain stable

## 2018-08-04 NOTE — Progress Notes (Addendum)
Progress Note  Patient Name: [name] Date of Encounter: 08/04/2018  Primary Cardiologist: Sinclair Grooms, MD  Electrophysiologist: Dr. Lovena Le   Subjective   No complaints currently. Denies CP and dyspnea.   Inpatient Medications    Scheduled Meds: . ALPRAZolam  0.5 mg Oral 2 times per day  . ALPRAZolam  1 mg Oral Daily  . aspirin  324 mg Oral Once  . aspirin  325 mg Oral Daily  . cholecalciferol  1,000 Units Oral Daily  . clopidogrel  75 mg Oral Daily  . doxazosin  4 mg Oral QHS  . ezetimibe  10 mg Oral Daily  . furosemide  160 mg Oral Daily  . heparin  5,000 Units Subcutaneous Q8H  . insulin aspart  0-9 Units Subcutaneous TID WC  . isosorbide mononitrate  60 mg Oral Daily  . loratadine  10 mg Oral Daily  . metoprolol  200 mg Oral Daily  . multivitamin with minerals  1 tablet Oral Daily  . olmesartan  20 mg Oral Daily  . pantoprazole  40 mg Oral Daily  . polyvinyl alcohol   Both Eyes QHS  . potassium chloride  20 mEq Oral BID  . simvastatin  20 mg Oral QHS  . sodium chloride flush  3 mL Intravenous Q12H  . sodium chloride flush  3 mL Intravenous Q12H  . venlafaxine  100 mg Oral Q1200  . venlafaxine  200 mg Oral Daily   Continuous Infusions: . sodium chloride    . sodium chloride    . sodium chloride 1 mL/kg/hr (08/04/18 0559)   PRN Meds: sodium chloride, sodium chloride, albuterol, fluticasone, nitroGLYCERIN, ondansetron, polyvinyl alcohol, sodium chloride flush, sodium chloride flush, traMADol   Vital Signs    Vitals:   08/03/18 1423 08/03/18 1935 08/03/18 2318 08/04/18 0539  BP: (!) 171/77 140/84 129/71 (!) 141/83  Pulse: 72 66  75  Resp:  18    Temp: 98.2 F (36.8 C) 98.3 F (36.8 C)  98.3 F (36.8 C)  TempSrc: Oral Oral  Oral  SpO2: 100% 99%  95%  Weight:    78 kg  Height:        Intake/Output Summary (Last 24 hours) at 08/04/2018 0808 Last data filed at 08/04/2018 4854 Gross per 24 hour  Intake 747.5 ml  Output -  Net 747.5 ml    Filed Weights   08/02/18 2110 08/03/18 0551 08/04/18 0539  Weight: 79.3 kg 79.1 kg 78 kg    Telemetry    Paced rhythm - Personally Reviewed  ECG    Atrial-sensed ventricular-paced rhythm - Personally Reviewed  Physical Exam   GEN: No acute distress.   Neck: No JVD Cardiac: RRR, no murmurs, rubs, or gallops.  Respiratory: Clear to auscultation bilaterally. GI: Soft, nontender, non-distended  MS: No edema; No deformity. Neuro:  Nonfocal  Psych: Normal affect   Labs    Chemistry Recent Labs  Lab 08/02/18 1650 08/02/18 2224 08/03/18 0426 08/04/18 0431  NA 140  --  141 142  K 4.2  --  3.5 4.2  CL 100  --  103 102  CO2 29  --  30 26  GLUCOSE 179*  --  129* 140*  BUN 10  --  11 13  CREATININE 2.08* 1.99* 2.00* 1.90*  CALCIUM 8.9  --  8.7* 8.9  GFRNONAA 24* 25* 25* 27*  GFRAA 28* 29* 29* 31*  ANIONGAP 11  --  8 14     Hematology Recent  Labs  Lab 08/02/18 2224 08/03/18 0426 08/04/18 0431  WBC 7.5 7.5 8.2  RBC 3.47* 3.42* 3.47*  HGB 10.5* 10.1* 10.3*  HCT 31.4* 30.9* 31.2*  MCV 90.5 90.4 89.9  MCH 30.3 29.5 29.7  MCHC 33.4 32.7 33.0  RDW 12.9 12.9 13.0  PLT 171 163 156    Cardiac Enzymes Recent Labs  Lab 08/02/18 2224 08/03/18 0426 08/03/18 1243  TROPONINI <0.03 <0.03 <0.03    Recent Labs  Lab 08/02/18 1704  TROPIPOC 0.00     BNPNo results for input(s): BNP, PROBNP in the last 168 hours.   DDimer No results for input(s): DDIMER in the last 168 hours.   Radiology    Dg Chest 2 View  Result Date: 08/02/2018 CLINICAL DATA:  Chest pain after exertion EXAM: CHEST - 2 VIEW COMPARISON:  12/12/2017 FINDINGS: Stable appearance of the chest with top-normal size heart and mild aortic atherosclerosis. ICD device projects over the left mid lung with leads in the right atrium, coronary sinus and right ventricle. Lungs are clear. No acute osseous abnormality. IMPRESSION: Stable appearance of the chest.  No active cardiopulmonary disease. Electronically  Signed   By: Ashley Royalty M.D.   On: 08/02/2018 18:25   Nm Myocar Multi W/spect W/wall Motion / Ef  Result Date: 08/03/2018 CLINICAL DATA:  Chest pain. History of CAD, post CABG in 1998) and prior myocardial infarction in 2014. History of paroxysmal supraventricular tachycardia, post pacemaker implantation. History of stroke in 2014. History of diabetes, hypertension, hyperlipidemia and CHF. EXAM: MYOCARDIAL IMAGING WITH SPECT (REST AND PHARMACOLOGIC-STRESS) GATED LEFT VENTRICULAR WALL MOTION STUDY LEFT VENTRICULAR EJECTION FRACTION TECHNIQUE: Standard myocardial SPECT imaging was performed after resting intravenous injection of 10 mCi Tc-62m tetrofosmin. Subsequently, intravenous infusion of Lexiscan was performed under the supervision of the Cardiology staff. At peak effect of the drug, 30 mCi Tc-59m tetrofosmin was injected intravenously and standard myocardial SPECT imaging was performed. Quantitative gated imaging was also performed to evaluate left ventricular wall motion, and estimate left ventricular ejection fraction. COMPARISON:  Chest radiograph-08/02/2018; nuclear medicine cardiac stress test-08/01/2016 FINDINGS: Raw images: Mild breast and chest wall attenuation is seen both provided rest stress images. Mild GI attenuation is seen, worse on provided rest images. There is no significant patient motion artifact. Perfusion: There is a matched area of decreased profusion involving the apex of left ventricle worrisome for an area prior infarction. There is a small area of mismatched perfusion involving the anterior wall of the left ventricle which could indicate a small area of pharmacologically induced ischemia. Wall Motion: Mild global hypokinesia most severely affecting the left ventricular apex at the location of suspected prior infarction. The left ventricle is mildly dilated. Left Ventricular Ejection Fraction: 44 % End diastolic volume 638 ml End systolic volume 62 ml IMPRESSION: 1. Potential small  area of pharmacologically induced ischemia involving the anterior wall of the left ventricle. 2. Suspected small area of prior infarction involving the apex of the left ventricle. 3. Mild global hypokinesia, most severely affecting the left ventricular apex. Ejection fraction-44%. 4. Mild left ventricular dilatation. Electronically Signed   By: Sandi Mariscal M.D.   On: 08/03/2018 16:45    Cardiac Studies   Echo 01/02/2016 LV EF: 45% - 50%  Study Conclusions  - Left ventricle: The cavity size was normal. Systolic function was mildly reduced. The estimated ejection fraction was in the range of 45% to 50%. Diffuse hypokinesis. Features are consistent with a pseudonormal left ventricular filling pattern, with concomitant abnormal relaxation  and increased filling pressure (grade 2 diastolic dysfunction). - Mitral valve: There was moderate regurgitation. - Left atrium: The atrium was moderately dilated. - Right ventricle: Poorly visualized. - Tricuspid valve: There was mild regurgitation.  NST 08/03/18  IMPRESSION: 1. Potential small area of pharmacologically induced ischemia involving the anterior wall of the left ventricle. 2. Suspected small area of prior infarction involving the apex of the left ventricle. 3. Mild global hypokinesia, most severely affecting the left ventricular apex. Ejection fraction-44%. 4. Mild left ventricular dilatation.  Patient Profile     67 y.o.femalewith an extensive cardiac history including CABG in 1998, severe ischemic cardiomyopathy with Biventricular ICD, presenting with chest pain on exertion during cardiac rehab  Assessment & Plan    1. Chest pain with high risk for cardiac etiology: Troponins negative. With abnormal Myoview, would be concerning for exertional angina (class II). Plan for LHC today. currently CP free.   2. CAD: s/p CABG in 1998. Abnormal stress test yesterday. LHC today to reassess native coronaries and grafts. She is  on both 325mg  ASA and plavix. Indication for 325mg  ASA is for CVA, need to discuss with neurology as outpatient if can reduce to 81mg  daily. Also on statin, BB, LA nitrate and ARB.  3. HTN: has been somewhat labile here with some systolic pressures in the 130s and some the 170s.  Currently on Toprol 200 mg daily as well as Benicar 20 mg.  She is also on Lasix, Imdur and doxazosin.. We will follow blood pressures here, and if necessary, could consider adding amlodipine.   4. HLD:  On simvastatin and Zetia. Last lipid panel at PCP office 09/2017 was 85 mg/dL. Goal LDL in the setting of CAD is < 70 mg/dL. May need further titration of simvastatin.   5. ICM s/p ICD: last EF 45-50% in 12/2015 --EF estimated roughly 45 % on Myoview. Continue medical therapy w/ BB, ARB and LA nitrate. Lasix for volume control.   6. Stage IV CKD: at baseline when compare to Feb 2019 (SCr ~2.0) 1.9 today. Monitor renal function post cath.   7. H/o CVA: on ASA 325 mg & Plavix    Signed, Lyda Jester, PA-C  08/04/2018, 8:08 AM     I have seen, examined and evaluated the patient this AM along with Ellen Henri, PA.  After reviewing all the available data and chart, we discussed the patients laboratory, study & physical findings as well as symptoms in detail. I agree with her findings, examination as well as impression recommendations as per our discussion.    She did have an episode of chest pain last night, but no further chest pain Renal function is pretty much of his baseline.  Would need to be judicious with contrast, however I do think that she would benefit from cardiac catheterization based on continued chest pain and abnormal stress test.  No active heart failure symptoms of blood pressure seems relatively stable.  Further plans based on cardiac catheterization findings   Glenetta Hew, M.D., M.S. Interventional Cardiologist   Pager # [phone] Phone # [phone] [address] [place], [place] [address]

## 2018-08-04 NOTE — H&P (View-Only) (Signed)
Progress Note  Patient Name: [name] Date of Encounter: 08/04/2018  Primary Cardiologist: Sinclair Grooms, MD  Electrophysiologist: Dr. Lovena Le   Subjective   No complaints currently. Denies CP and dyspnea.   Inpatient Medications    Scheduled Meds: . ALPRAZolam  0.5 mg Oral 2 times per day  . ALPRAZolam  1 mg Oral Daily  . aspirin  324 mg Oral Once  . aspirin  325 mg Oral Daily  . cholecalciferol  1,000 Units Oral Daily  . clopidogrel  75 mg Oral Daily  . doxazosin  4 mg Oral QHS  . ezetimibe  10 mg Oral Daily  . furosemide  160 mg Oral Daily  . heparin  5,000 Units Subcutaneous Q8H  . insulin aspart  0-9 Units Subcutaneous TID WC  . isosorbide mononitrate  60 mg Oral Daily  . loratadine  10 mg Oral Daily  . metoprolol  200 mg Oral Daily  . multivitamin with minerals  1 tablet Oral Daily  . olmesartan  20 mg Oral Daily  . pantoprazole  40 mg Oral Daily  . polyvinyl alcohol   Both Eyes QHS  . potassium chloride  20 mEq Oral BID  . simvastatin  20 mg Oral QHS  . sodium chloride flush  3 mL Intravenous Q12H  . sodium chloride flush  3 mL Intravenous Q12H  . venlafaxine  100 mg Oral Q1200  . venlafaxine  200 mg Oral Daily   Continuous Infusions: . sodium chloride    . sodium chloride    . sodium chloride 1 mL/kg/hr (08/04/18 0559)   PRN Meds: sodium chloride, sodium chloride, albuterol, fluticasone, nitroGLYCERIN, ondansetron, polyvinyl alcohol, sodium chloride flush, sodium chloride flush, traMADol   Vital Signs    Vitals:   08/03/18 1423 08/03/18 1935 08/03/18 2318 08/04/18 0539  BP: (!) 171/77 140/84 129/71 (!) 141/83  Pulse: 72 66  75  Resp:  18    Temp: 98.2 F (36.8 C) 98.3 F (36.8 C)  98.3 F (36.8 C)  TempSrc: Oral Oral  Oral  SpO2: 100% 99%  95%  Weight:    78 kg  Height:        Intake/Output Summary (Last 24 hours) at 08/04/2018 0808 Last data filed at 08/04/2018 0263 Gross per 24 hour  Intake 747.5 ml  Output -  Net 747.5 ml    Filed Weights   08/02/18 2110 08/03/18 0551 08/04/18 0539  Weight: 79.3 kg 79.1 kg 78 kg    Telemetry    Paced rhythm - Personally Reviewed  ECG    Atrial-sensed ventricular-paced rhythm - Personally Reviewed  Physical Exam   GEN: No acute distress.   Neck: No JVD Cardiac: RRR, no murmurs, rubs, or gallops.  Respiratory: Clear to auscultation bilaterally. GI: Soft, nontender, non-distended  MS: No edema; No deformity. Neuro:  Nonfocal  Psych: Normal affect   Labs    Chemistry Recent Labs  Lab 08/02/18 1650 08/02/18 2224 08/03/18 0426 08/04/18 0431  NA 140  --  141 142  K 4.2  --  3.5 4.2  CL 100  --  103 102  CO2 29  --  30 26  GLUCOSE 179*  --  129* 140*  BUN 10  --  11 13  CREATININE 2.08* 1.99* 2.00* 1.90*  CALCIUM 8.9  --  8.7* 8.9  GFRNONAA 24* 25* 25* 27*  GFRAA 28* 29* 29* 31*  ANIONGAP 11  --  8 14     Hematology Recent  Labs  Lab 08/02/18 2224 08/03/18 0426 08/04/18 0431  WBC 7.5 7.5 8.2  RBC 3.47* 3.42* 3.47*  HGB 10.5* 10.1* 10.3*  HCT 31.4* 30.9* 31.2*  MCV 90.5 90.4 89.9  MCH 30.3 29.5 29.7  MCHC 33.4 32.7 33.0  RDW 12.9 12.9 13.0  PLT 171 163 156    Cardiac Enzymes Recent Labs  Lab 08/02/18 2224 08/03/18 0426 08/03/18 1243  TROPONINI <0.03 <0.03 <0.03    Recent Labs  Lab 08/02/18 1704  TROPIPOC 0.00     BNPNo results for input(s): BNP, PROBNP in the last 168 hours.   DDimer No results for input(s): DDIMER in the last 168 hours.   Radiology    Dg Chest 2 View  Result Date: 08/02/2018 CLINICAL DATA:  Chest pain after exertion EXAM: CHEST - 2 VIEW COMPARISON:  12/12/2017 FINDINGS: Stable appearance of the chest with top-normal size heart and mild aortic atherosclerosis. ICD device projects over the left mid lung with leads in the right atrium, coronary sinus and right ventricle. Lungs are clear. No acute osseous abnormality. IMPRESSION: Stable appearance of the chest.  No active cardiopulmonary disease. Electronically  Signed   By: Ashley Royalty M.D.   On: 08/02/2018 18:25   Nm Myocar Multi W/spect W/wall Motion / Ef  Result Date: 08/03/2018 CLINICAL DATA:  Chest pain. History of CAD, post CABG in 1998) and prior myocardial infarction in 2014. History of paroxysmal supraventricular tachycardia, post pacemaker implantation. History of stroke in 2014. History of diabetes, hypertension, hyperlipidemia and CHF. EXAM: MYOCARDIAL IMAGING WITH SPECT (REST AND PHARMACOLOGIC-STRESS) GATED LEFT VENTRICULAR WALL MOTION STUDY LEFT VENTRICULAR EJECTION FRACTION TECHNIQUE: Standard myocardial SPECT imaging was performed after resting intravenous injection of 10 mCi Tc-40m tetrofosmin. Subsequently, intravenous infusion of Lexiscan was performed under the supervision of the Cardiology staff. At peak effect of the drug, 30 mCi Tc-11m tetrofosmin was injected intravenously and standard myocardial SPECT imaging was performed. Quantitative gated imaging was also performed to evaluate left ventricular wall motion, and estimate left ventricular ejection fraction. COMPARISON:  Chest radiograph-08/02/2018; nuclear medicine cardiac stress test-08/01/2016 FINDINGS: Raw images: Mild breast and chest wall attenuation is seen both provided rest stress images. Mild GI attenuation is seen, worse on provided rest images. There is no significant patient motion artifact. Perfusion: There is a matched area of decreased profusion involving the apex of left ventricle worrisome for an area prior infarction. There is a small area of mismatched perfusion involving the anterior wall of the left ventricle which could indicate a small area of pharmacologically induced ischemia. Wall Motion: Mild global hypokinesia most severely affecting the left ventricular apex at the location of suspected prior infarction. The left ventricle is mildly dilated. Left Ventricular Ejection Fraction: 44 % End diastolic volume 086 ml End systolic volume 62 ml IMPRESSION: 1. Potential small  area of pharmacologically induced ischemia involving the anterior wall of the left ventricle. 2. Suspected small area of prior infarction involving the apex of the left ventricle. 3. Mild global hypokinesia, most severely affecting the left ventricular apex. Ejection fraction-44%. 4. Mild left ventricular dilatation. Electronically Signed   By: Sandi Mariscal M.D.   On: 08/03/2018 16:45    Cardiac Studies   Echo 01/02/2016 LV EF: 45% - 50%  Study Conclusions  - Left ventricle: The cavity size was normal. Systolic function was mildly reduced. The estimated ejection fraction was in the range of 45% to 50%. Diffuse hypokinesis. Features are consistent with a pseudonormal left ventricular filling pattern, with concomitant abnormal relaxation  and increased filling pressure (grade 2 diastolic dysfunction). - Mitral valve: There was moderate regurgitation. - Left atrium: The atrium was moderately dilated. - Right ventricle: Poorly visualized. - Tricuspid valve: There was mild regurgitation.  NST 08/03/18  IMPRESSION: 1. Potential small area of pharmacologically induced ischemia involving the anterior wall of the left ventricle. 2. Suspected small area of prior infarction involving the apex of the left ventricle. 3. Mild global hypokinesia, most severely affecting the left ventricular apex. Ejection fraction-44%. 4. Mild left ventricular dilatation.  Patient Profile     67 y.o.femalewith an extensive cardiac history including CABG in 1998, severe ischemic cardiomyopathy with Biventricular ICD, presenting with chest pain on exertion during cardiac rehab  Assessment & Plan    1. Chest pain with high risk for cardiac etiology: Troponins negative. With abnormal Myoview, would be concerning for exertional angina (class II). Plan for LHC today. currently CP free.   2. CAD: s/p CABG in 1998. Abnormal stress test yesterday. LHC today to reassess native coronaries and grafts. She is  on both 325mg  ASA and plavix. Indication for 325mg  ASA is for CVA, need to discuss with neurology as outpatient if can reduce to 81mg  daily. Also on statin, BB, LA nitrate and ARB.  3. HTN: has been somewhat labile here with some systolic pressures in the 130s and some the 170s.  Currently on Toprol 200 mg daily as well as Benicar 20 mg.  She is also on Lasix, Imdur and doxazosin.. We will follow blood pressures here, and if necessary, could consider adding amlodipine.   4. HLD:  On simvastatin and Zetia. Last lipid panel at PCP office 09/2017 was 85 mg/dL. Goal LDL in the setting of CAD is < 70 mg/dL. May need further titration of simvastatin.   5. ICM s/p ICD: last EF 45-50% in 12/2015 --EF estimated roughly 45 % on Myoview. Continue medical therapy w/ BB, ARB and LA nitrate. Lasix for volume control.   6. Stage IV CKD: at baseline when compare to Feb 2019 (SCr ~2.0) 1.9 today. Monitor renal function post cath.   7. H/o CVA: on ASA 325 mg & Plavix    Signed, Lyda Jester, PA-C  08/04/2018, 8:08 AM     I have seen, examined and evaluated the patient this AM along with Ellen Henri, PA.  After reviewing all the available data and chart, we discussed the patients laboratory, study & physical findings as well as symptoms in detail. I agree with her findings, examination as well as impression recommendations as per our discussion.    She did have an episode of chest pain last night, but no further chest pain Renal function is pretty much of his baseline.  Would need to be judicious with contrast, however I do think that she would benefit from cardiac catheterization based on continued chest pain and abnormal stress test.  No active heart failure symptoms of blood pressure seems relatively stable.  Further plans based on cardiac catheterization findings   Glenetta Hew, M.D., M.S. Interventional Cardiologist   Pager # [phone] Phone # [phone] [address] [place], [place] [address]

## 2018-08-04 NOTE — Interval H&P Note (Signed)
History and Physical Interval Note:  08/04/2018 2:27 PM  [name]  has presented today for cardiac cath with the diagnosis of unstable angina. The various methods of treatment have been discussed with the patient and family. After consideration of risks, benefits and other options for treatment, the patient has consented to  Procedure(s): LEFT HEART CATH AND CORS/GRAFTS ANGIOGRAPHY (N/A) as a surgical intervention .  The patient's history has been reviewed, patient examined, no change in status, stable for surgery.  I have reviewed the patient's chart and labs.  Questions were answered to the patient's satisfaction.    Cath Lab Visit (complete for each Cath Lab visit)  Clinical Evaluation Leading to the Procedure:   ACS: No.  Non-ACS:    Anginal Classification: CCS III  Anti-ischemic medical therapy: Maximal Therapy (2 or more classes of medications)  Non-Invasive Test Results: Intermediate-risk stress test findings: cardiac mortality 1-3%/year  Prior CABG: Previous CABG        Lauree Chandler

## 2018-08-05 DIAGNOSIS — R072 Precordial pain: Secondary | ICD-10-CM | POA: Diagnosis not present

## 2018-08-05 DIAGNOSIS — I25708 Atherosclerosis of coronary artery bypass graft(s), unspecified, with other forms of angina pectoris: Secondary | ICD-10-CM | POA: Diagnosis not present

## 2018-08-05 LAB — CBC
HEMATOCRIT: 30.8 % — AB (ref 36.0–46.0)
HEMOGLOBIN: 10.1 g/dL — AB (ref 12.0–15.0)
MCH: 29.5 pg (ref 26.0–34.0)
MCHC: 32.8 g/dL (ref 30.0–36.0)
MCV: 90.1 fL (ref 78.0–100.0)
PLATELETS: 149 10*3/uL — AB (ref 150–400)
RBC: 3.42 MIL/uL — ABNORMAL LOW (ref 3.87–5.11)
RDW: 13 % (ref 11.5–15.5)
WBC: 6.3 10*3/uL (ref 4.0–10.5)

## 2018-08-05 LAB — BASIC METABOLIC PANEL
ANION GAP: 10 (ref 5–15)
BUN: 13 mg/dL (ref 8–23)
CHLORIDE: 101 mmol/L (ref 98–111)
CO2: 29 mmol/L (ref 22–32)
Calcium: 8.6 mg/dL — ABNORMAL LOW (ref 8.9–10.3)
Creatinine, Ser: 2.03 mg/dL — ABNORMAL HIGH (ref 0.44–1.00)
GFR calc Af Amer: 28 mL/min — ABNORMAL LOW (ref >60)
GFR, EST NON AFRICAN AMERICAN: 25 mL/min — AB (ref >60)
GLUCOSE: 142 mg/dL — AB (ref 70–99)
POTASSIUM: 3.8 mmol/L (ref 3.5–5.1)
Sodium: 140 mmol/L (ref 135–145)

## 2018-08-05 LAB — GLUCOSE, CAPILLARY: GLUCOSE-CAPILLARY: 134 mg/dL — AB (ref 70–99)

## 2018-08-05 NOTE — Discharge Summary (Addendum)
The patient has been seen in conjunction with Fabian Sharp, PA-C. All aspects of care have been considered and discussed. The patient has been personally interviewed, examined, and all clinical data has been reviewed.   Ruled out for myocardial infarction.  Each episode of chest pain was less than 10 minutes and was exercise-induced.  Coronary anatomy is stable and management is medical therapy.  Patient is ready for discharge.  Hematocrit and kidney function are normal.  Access site is without evidence of hematoma.  Resume cardiac rehab.    Exercise below anginal threshold.  Discharge Summary    Patient ID: [name] MRN: [id number]; DOB: 10-Feb-1951  Admit date: 08/02/2018 Discharge date: 08/05/2018  Primary Care Provider: [name], MD  Primary Cardiologist: Sinclair Grooms, MD  Primary Electrophysiologist:  None   Discharge Diagnoses    Principal Problem:   Coronary artery disease involving coronary bypass graft of native heart with angina pectoris [facility]) Active Problems:   Biventricular implantable cardioverter-defibrillator in situ   Paroxysmal SVT (supraventricular tachycardia) (HCC)   HTN (hypertension)   Chronic renal insufficiency, stage III (moderate) (HCC)   Ischemic cardiomyopathy   Chronic diastolic heart failure (HCC)   Hyperlipidemia   Chest pain     Allergies Allergies  Allergen Reactions  . Calcium Channel Blockers Other (See Comments)    Cannot take non-DHP CCBs (diltiazem, verapamil) due to CHF requiring ICD  . Codeine Anaphylaxis  . Lyrica [Pregabalin]     Nightmares, and swelling  . Other Anaphylaxis    Walnuts, pecans, and ANY melons PATIENT IS A VEGAN  . Ramipril Anaphylaxis and Swelling  . Ticlid [Ticlopidine Hcl] Other (See Comments)    Unprovoked bleeding while on Ticlid (doesn't think she was on ASA at the time) Takes Plavix without problems  . Morphine And Related Other (See Comments)    Severe AMS, confusion,  weakness Tolerates Fentanyl, Tramadol  . Tape Dermatitis and Rash    Tolerates paper tape  . Digoxin And Related Nausea Only    Unknown  . Metolazone Other (See Comments)    Cannot remember reaction  . Myrbetriq [Mirabegron] Other (See Comments)    Aggravates migraines  . Onglyza [Saxagliptin] Other (See Comments)    Exacerbates migraines  . Penicillins Hives    Has patient had a PCN reaction causing immediate rash, facial/tongue/throat swelling, SOB or lightheadedness with hypotension: Yes Has patient had a PCN reaction causing severe rash involving mucus membranes or skin necrosis: Unknown Has patient had a PCN reaction that required hospitalization: Unknown Has patient had a PCN reaction occurring within the last 10 years: No If all of the above answers are "NO", then may proceed with Cephalosporin use.   [name] Spironolactone Other (See Comments)    Cannot remember reaction  . Sulfa Antibiotics Hives and Other (See Comments)  . Tetracyclines & Related Other (See Comments)    Cannot remember reaction Tolerates macrolides (azithromycin)  . Vicodin [Hydrocodone-Acetaminophen] Other (See Comments)    EXTREME LETHARGY  . Latex Itching and Rash    Diagnostic Studies/Procedures    Left heart cath 08/04/18:  Prox RCA lesion is 100% stenosed.  SVG graft was visualized by angiography and is normal in caliber.  Ost 1st Diag lesion is 100% stenosed.  SVG graft was visualized by angiography and is normal in caliber.  Prox LAD to Mid LAD lesion is 90% stenosed.  Mid LM to Dist LM lesion is 50% stenosed.  Mid Cx to Dist Cx lesion is  60% stenosed.  Ost 2nd Mrg lesion is 50% stenosed.   1. Severe native vessel CAD s/p 3V CABG with 3/3 patent grafts 2. Mild distal left main stenosis 3. Severe mid LAD stenosis. Antegrade flow and retrograde flow in the mid and distal LAD. The LIMA to the mid LAD is patent. The Diagonal is occluded. The SVG to the Diagonal is patent.  4. The Circumflex  is a large caliber artery with mild plaque in the mid segment. The distal AV groove Circumflex has chronic diffuse stenosis 5. The RCA is chronically occluded in the proximal segment. The SVG to the PDA is patent.   Recommendations: Continue medical management of CAD.     History of Present Illness     [name] is a 67 y.o. female with an extensive cardiac history including CABG in 1998, severe ischemic cardiomyopathy with Biventricular ICD, presenting with chest pain on exertion.  [name] was in her normal state of health today when she went to the maintenance program of cardiac rehab. During exercise, she developed left sided chest pain that she describes as a 'sharp pain' radiating to the left shoulder. She also had weakness and diaphoresis. She slowed down but chest pain recurred. With further rest the chest pain resolved after a period of approximately 20 minutes. She continued to feel weak and diaphoretic. Symptoms have now fully resolved but she feels 'washed out.' she denies recent problems with shortness of breath, edema, orthopnea, or PND. No ICD discharges.   Hospital Course     Consultants: none  Chest pain, CAD s/p CABG x 3 She was seen and admitted to cardiology. Chest pain had relieved with rest. Initial plans to trend enzymes overnight and obtain lexiscan myoview the following day. Troponins remained negative, but myoview was abnorma. Concerning for exertional angina (Calss II). Left heart cath on 08/04/18 revealed sever native vessel CAD s/p CABG x 3 with 3/3 patent grafts. No intervention, medical management was recommended. She was on full dose ASA and plavix for CVA. Need to reach out to neurology to see if ASA dose can be lowered.   HTN  Continue all home medications. Titrate at outpatient visit if needed.   HLD  Will need an updated lipid profile.  Continue statin.   ICM s/p ICD in place  Last EF was 45-50%. Echo    CKD stage IV sCr on  discharge is 2.03, stable from prior to heart cath. Avoiding nephrotoxic agents.   Pt seen and examined by Dr. Tamala Julian and deemed stable for discharge. Follow up will be arranged.   _____________  Discharge Vitals Blood pressure (!) 145/81, pulse 77, temperature 99.3 F (37.4 C), temperature source Oral, resp. rate 18, height 5' 4.5" (1.638 m), weight 77.8 kg, SpO2 96 %.  Filed Weights   08/03/18 0551 08/04/18 0539 08/05/18 0611  Weight: 79.1 kg 78 kg 77.8 kg    Physical Exam  Constitutional: She is oriented to person, place, and time. She appears well-developed and well-nourished. No distress.  HENT:  Head: Normocephalic and atraumatic.  Neck: No JVD present.  Cardiovascular: Normal rate and regular rhythm.  No murmur heard. Pulmonary/Chest: Effort normal and breath sounds normal.  Abdominal: Soft. Bowel sounds are normal.  Musculoskeletal: She exhibits no edema.  Neurological: She is alert and oriented to person, place, and time.  Skin: Skin is warm and dry.  Psychiatric: She has a normal mood and affect.     Labs & Radiologic Studies    CBC  Recent Labs    08/04/18 0431 08/05/18 0608  WBC 8.2 6.3  HGB 10.3* 10.1*  HCT 31.2* 30.8*  MCV 89.9 90.1  PLT 156 026*   Basic Metabolic Panel Recent Labs    08/04/18 0431 08/05/18 0608  NA 142 140  K 4.2 3.8  CL 102 101  CO2 26 29  GLUCOSE 140* 142*  BUN 13 13  CREATININE 1.90* 2.03*  CALCIUM 8.9 8.6*   Liver Function Tests No results for input(s): AST, ALT, ALKPHOS, BILITOT, PROT, ALBUMIN in the last 72 hours. No results for input(s): LIPASE, AMYLASE in the last 72 hours. Cardiac Enzymes Recent Labs    08/02/18 2224 08/03/18 0426 08/03/18 1243  TROPONINI <0.03 <0.03 <0.03   BNP Invalid input(s): POCBNP D-Dimer No results for input(s): DDIMER in the last 72 hours. Hemoglobin A1C No results for input(s): HGBA1C in the last 72 hours. Fasting Lipid Panel No results for input(s): CHOL, HDL, LDLCALC, TRIG,  CHOLHDL, LDLDIRECT in the last 72 hours. Thyroid Function Tests No results for input(s): TSH, T4TOTAL, T3FREE, THYROIDAB in the last 72 hours.  Invalid input(s): FREET3 _____________  Dg Chest 2 View  Result Date: 08/02/2018 CLINICAL DATA:  Chest pain after exertion EXAM: CHEST - 2 VIEW COMPARISON:  12/12/2017 FINDINGS: Stable appearance of the chest with top-normal size heart and mild aortic atherosclerosis. ICD device projects over the left mid lung with leads in the right atrium, coronary sinus and right ventricle. Lungs are clear. No acute osseous abnormality. IMPRESSION: Stable appearance of the chest.  No active cardiopulmonary disease. Electronically Signed   By: Ashley Royalty M.D.   On: 08/02/2018 18:25   Nm Myocar Multi W/spect W/wall Motion / Ef  Result Date: 08/03/2018 CLINICAL DATA:  Chest pain. History of CAD, post CABG in 1998) and prior myocardial infarction in 2014. History of paroxysmal supraventricular tachycardia, post pacemaker implantation. History of stroke in 2014. History of diabetes, hypertension, hyperlipidemia and CHF. EXAM: MYOCARDIAL IMAGING WITH SPECT (REST AND PHARMACOLOGIC-STRESS) GATED LEFT VENTRICULAR WALL MOTION STUDY LEFT VENTRICULAR EJECTION FRACTION TECHNIQUE: Standard myocardial SPECT imaging was performed after resting intravenous injection of 10 mCi Tc-32m tetrofosmin. Subsequently, intravenous infusion of Lexiscan was performed under the supervision of the Cardiology staff. At peak effect of the drug, 30 mCi Tc-84m tetrofosmin was injected intravenously and standard myocardial SPECT imaging was performed. Quantitative gated imaging was also performed to evaluate left ventricular wall motion, and estimate left ventricular ejection fraction. COMPARISON:  Chest radiograph-08/02/2018; nuclear medicine cardiac stress test-08/01/2016 FINDINGS: Raw images: Mild breast and chest wall attenuation is seen both provided rest stress images. Mild GI attenuation is seen, worse  on provided rest images. There is no significant patient motion artifact. Perfusion: There is a matched area of decreased profusion involving the apex of left ventricle worrisome for an area prior infarction. There is a small area of mismatched perfusion involving the anterior wall of the left ventricle which could indicate a small area of pharmacologically induced ischemia. Wall Motion: Mild global hypokinesia most severely affecting the left ventricular apex at the location of suspected prior infarction. The left ventricle is mildly dilated. Left Ventricular Ejection Fraction: 44 % End diastolic volume 378 ml End systolic volume 62 ml IMPRESSION: 1. Potential small area of pharmacologically induced ischemia involving the anterior wall of the left ventricle. 2. Suspected small area of prior infarction involving the apex of the left ventricle. 3. Mild global hypokinesia, most severely affecting the left ventricular apex. Ejection fraction-44%. 4. Mild left ventricular dilatation.  Electronically Signed   By: Sandi Mariscal M.D.   On: 08/03/2018 16:45   Disposition   Pt is being discharged home today in good condition.  Follow-up Plans & Appointments    Follow-up Information    Belva Crome, MD Follow up in 2 week(s).   Specialty:  Cardiology Contact information: [address] [place] [address] [phone]          Discharge Instructions    Diet - low sodium heart healthy   Complete by:  As directed    Discharge instructions   Complete by:  As directed    No driving for 2 days. No lifting over 5 lbs for 1 week. No sexual activity for 1 week. You may return to work in 1 week. Keep procedure site clean & dry. If you notice increased pain, swelling, bleeding or pus, call/return!  You may shower, but no soaking baths/hot tubs/pools for 1 week.   Increase activity slowly   Complete by:  As directed       Discharge Medications   Allergies as of 08/05/2018       Reactions   Calcium Channel Blockers Other (See Comments)   Cannot take non-DHP CCBs (diltiazem, verapamil) due to CHF requiring ICD   Codeine Anaphylaxis   Lyrica [pregabalin]    Nightmares, and swelling   Other Anaphylaxis   Walnuts, pecans, and ANY melons PATIENT IS A VEGAN   Ramipril Anaphylaxis, Swelling   Ticlid [ticlopidine Hcl] Other (See Comments)   Unprovoked bleeding while on Ticlid (doesn't think she was on ASA at the time) Takes Plavix without problems   Morphine And Related Other (See Comments)   Severe AMS, confusion, weakness Tolerates Fentanyl, Tramadol   Tape Dermatitis, Rash   Tolerates paper tape   Digoxin And Related Nausea Only   Unknown   Metolazone Other (See Comments)   Cannot remember reaction   Myrbetriq [mirabegron] Other (See Comments)   Aggravates migraines   Onglyza [saxagliptin] Other (See Comments)   Exacerbates migraines   Penicillins Hives   Has patient had a PCN reaction causing immediate rash, facial/tongue/throat swelling, SOB or lightheadedness with hypotension: Yes Has patient had a PCN reaction causing severe rash involving mucus membranes or skin necrosis: Unknown Has patient had a PCN reaction that required hospitalization: Unknown Has patient had a PCN reaction occurring within the last 10 years: No If all of the above answers are "NO", then may proceed with Cephalosporin use.   Spironolactone Other (See Comments)   Cannot remember reaction   Sulfa Antibiotics Hives, Other (See Comments)   Tetracyclines & Related Other (See Comments)   Cannot remember reaction Tolerates macrolides (azithromycin)   Vicodin [hydrocodone-acetaminophen] Other (See Comments)   EXTREME LETHARGY   Latex Itching, Rash      Medication List    TAKE these medications   albuterol 108 (90 Base) MCG/ACT inhaler Commonly known as:  PROVENTIL HFA;VENTOLIN HFA Inhale 2 puffs into the lungs every 4 (four) hours as needed for wheezing or shortness of breath.     ALPRAZolam 0.5 MG tablet Commonly known as:  XANAX Take 0.5-1 tablets by mouth 3 (three) times daily. 1 mg in the morning then 0.5 mg at noon then 0.5 mg at bedtime   Artificial Tear Gel Place 2 drops into both eyes daily as needed (dry eyes).   aspirin 325 MG tablet Take 325 mg by mouth daily.   BIOTIN PO Take by mouth daily.   cetirizine 10  MG tablet Commonly known as:  ZYRTEC Take 10 mg by mouth daily.   clopidogrel 75 MG tablet Commonly known as:  PLAVIX Take 1 tablet (75 mg total) by mouth daily.   cyclobenzaprine 5 MG tablet Commonly known as:  FLEXERIL Take 1 tablet (5 mg total) by mouth at bedtime as needed for muscle spasms.   doxazosin 4 MG tablet Commonly known as:  CARDURA Take 4 mg by mouth at bedtime.   ezetimibe 10 MG tablet Commonly known as:  ZETIA Take 1 tablet (10 mg total) by mouth daily.   fluticasone 50 MCG/ACT nasal spray Commonly known as:  FLONASE Place 2 sprays into both nostrils daily as needed for allergies or rhinitis.   furosemide 80 MG tablet Commonly known as:  LASIX Take two (2) tablets (160 mg) by mouth daily. Take one (1) additional tablet (80 mg) by mouth daily as needed for swelling.   insulin glargine 100 UNIT/ML injection Commonly known as:  LANTUS Inject 18 Units into the skin at bedtime.   isosorbide mononitrate 60 MG 24 hr tablet Commonly known as:  IMDUR Take 1 tablet (60 mg total) by mouth daily.   metoprolol 200 MG 24 hr tablet Commonly known as:  TOPROL-XL Take 200 mg by mouth daily.   multivitamin with minerals Tabs tablet Take 1 tablet by mouth daily.   NITROSTAT 0.4 MG SL tablet Generic drug:  nitroGLYCERIN Place 1 tablet under the tongue every 5 minutes as needed for chest pain, max 3 doses, go to er if no relief   olmesartan 20 MG tablet Commonly known as:  BENICAR Take 1 tablet (20 mg total) by mouth daily.   ondansetron 8 MG tablet Commonly known as:  ZOFRAN Take 8 mg by mouth every 8 (eight)  hours as needed for nausea or vomiting.   pantoprazole 40 MG tablet Commonly known as:  PROTONIX Take 40 mg by mouth daily.   potassium chloride 10 MEQ tablet Commonly known as:  K-DUR,KLOR-CON TAKE 2 TABLETS BY MOUTH TWO TIMES DAILY   Riboflavin 100 MG Tabs Take 2 tablets (200 mg total) by mouth daily.   simvastatin 20 MG tablet Commonly known as:  ZOCOR Take 1 tablet (20 mg total) by mouth at bedtime.   sodium chloride 5 % ophthalmic ointment Commonly known as:  MURO 128 Place 1 drop into both eyes at bedtime. For dry eyes   traMADol 50 MG tablet Commonly known as:  ULTRAM Take 100 mg by mouth every 6 (six) hours as needed (MIGRAINES).   venlafaxine 100 MG tablet Commonly known as:  EFFEXOR Take 100-200 mg by mouth See admin instructions. 200 mg in the morning then 100 mg at noon   vitamin A 7500 UNIT capsule Take 2,400 Units by mouth daily.   Vitamin D-3 1000 units Caps Take 1 capsule by mouth daily.        Acute coronary syndrome (MI, NSTEMI, STEMI, etc) this admission?: No.    Outstanding Labs/Studies     Duration of Discharge Encounter   Greater than 30 minutes including physician time.  Signed, Callahan, PA 08/05/2018, 11:02 AM

## 2018-08-05 NOTE — Progress Notes (Signed)
CARDIOLOGY DISCHARGE  Mrs. Fay had angina pectoris while at cardiac rehab.  2 episodes lasting less than 10 minutes each.  Repeat cardiac catheterization demonstrated chronic coronary disease unchanged from prior angiogram.  Femoral cath site is unremarkable.  Plan discharge today and continue same medication as she has taken as an outpatient prior to admission.  Needs clinical follow-up in 2 to 3 weeks.

## 2018-08-07 ENCOUNTER — Encounter (HOSPITAL_COMMUNITY): Payer: Medicare HMO

## 2018-08-07 ENCOUNTER — Telehealth (HOSPITAL_COMMUNITY): Payer: Self-pay | Admitting: *Deleted

## 2018-08-07 ENCOUNTER — Encounter (HOSPITAL_COMMUNITY): Payer: Self-pay | Admitting: Cardiovascular Disease

## 2018-08-09 ENCOUNTER — Encounter (HOSPITAL_COMMUNITY): Payer: Medicare HMO

## 2018-08-09 DIAGNOSIS — I2510 Atherosclerotic heart disease of native coronary artery without angina pectoris: Secondary | ICD-10-CM | POA: Diagnosis present

## 2018-08-09 DIAGNOSIS — I110 Hypertensive heart disease with heart failure: Secondary | ICD-10-CM | POA: Diagnosis not present

## 2018-08-09 DIAGNOSIS — E119 Type 2 diabetes mellitus without complications: Secondary | ICD-10-CM | POA: Diagnosis not present

## 2018-08-09 DIAGNOSIS — I252 Old myocardial infarction: Secondary | ICD-10-CM | POA: Diagnosis not present

## 2018-08-09 DIAGNOSIS — Z95810 Presence of automatic (implantable) cardiac defibrillator: Secondary | ICD-10-CM | POA: Diagnosis not present

## 2018-08-09 DIAGNOSIS — I255 Ischemic cardiomyopathy: Secondary | ICD-10-CM | POA: Diagnosis not present

## 2018-08-09 DIAGNOSIS — I509 Heart failure, unspecified: Secondary | ICD-10-CM | POA: Insufficient documentation

## 2018-08-09 DIAGNOSIS — Z8673 Personal history of transient ischemic attack (TIA), and cerebral infarction without residual deficits: Secondary | ICD-10-CM | POA: Diagnosis not present

## 2018-08-11 ENCOUNTER — Encounter (HOSPITAL_COMMUNITY): Payer: Medicare HMO

## 2018-08-14 ENCOUNTER — Encounter (HOSPITAL_COMMUNITY): Payer: Medicare HMO

## 2018-08-14 ENCOUNTER — Ambulatory Visit (INDEPENDENT_AMBULATORY_CARE_PROVIDER_SITE_OTHER): Payer: Medicare HMO | Admitting: *Deleted

## 2018-08-14 DIAGNOSIS — I255 Ischemic cardiomyopathy: Secondary | ICD-10-CM

## 2018-08-14 DIAGNOSIS — I5022 Chronic systolic (congestive) heart failure: Secondary | ICD-10-CM

## 2018-08-14 NOTE — Progress Notes (Signed)
Remote ICD transmission.   

## 2018-08-14 NOTE — Progress Notes (Signed)
Cardiology Office Note   Date:  08/15/2018   ID:  Artha, Stavros 1951/06/25, MRN [id number]  PCP:  [name], MD  Cardiologist: Dr. Daneen Schick, MD  Chief Complaint  Patient presents with  . Coronary Artery Disease  . Cardiomyopathy  . Hospitalization Follow-up   History of Present Illness: [name] is a 67 y.o. female who presents for post hospital follow up for CAD, seen for Dr. Tamala Julian. She has a prior hx of CAD s/p CABG 1998, ischemic cardiomyopathy s/p BiV Defibrillator [employer]tr[employer] (followed by Dr. Lovena Le), mitral valve disease with moderate regurgitation, DM2, and ischemic stroke. She had a cardiopulmonary excercise study in 2017 with no ventilatory limitation with significant chronotropic incompetence. Echocardiogram in 12/2015 with LVEF up to 45-50% with G2DD, moderate MR and mild TR. Last cath 08/04/2018 with chronic severe coronary disease, unchanged from prior angiogram with multi-vessel native disease and patent LIMA to LAD, patent SVG to Dig and patent SVG to PDA.   She was last seen by Dr. Tamala Julian in the office on 05/05/2018 as was doing well without chest pain, orthopnea or PND. She had reported occasional LE swelling and sharp pains at her device site, but the discomfort was not precipitated by exertion. There was no AICD discharges.  More recently she was participating in cardiac rehabilitation and developed 8/10 chest pain while exercising. The pain subsided with rest however she was transported to the ED and was admitted for further evaluation on 08/02/18.  She underwent a Lexiscan nuclear study on 08/03/18 which showed potential small area of pharmacologically induced ischemia involving the anterior wall of the left ventricle and small area of prior infarction involving the apex of the left ventricle with mild global hypokinesia. Given the above, she underwent a cath on 08/04/18 which was essentially unchanged from prior angiogram with recommendations for  medical management and to resume cardiac rehab below anginal threshold. She was discharged on 08/05/18 and presents here today for follow up.  Today she is doing fairly well from a heart perspective. She reports lots of anxiety and "outside" stressors that are impinging on her health. She would not go into great detail, however reports that she is seeing a counselor for her anxiety. She reports infrequent "twinges" of mild chest pain that are very short and dissipate on their own which she states are a direct result of her increased stress. She states that she has resumed with cardiac rehab this past Monday and that it has helped with her mental health. She denies current chest pain, palpitations, SOB, fatigue, orthopnea symptoms, LE swelling, recent fever, chills, nausea or vomiting. She reports medication compliance.   Past Medical History:  Diagnosis Date  . AICD (automatic cardioverter/defibrillator) present    Medtronic- Dr. Beckie Salts follows  . Anginal pain ([place])   . Anxiety   . Asthma   . Asthma   . Back pain    "pinched nerve-lower back" - Dr. Nelva Bush follows.  . Biventricular implantable cardioverter-defibrillator in situ 06/18/2011  . Cerebral infarction ([place]) 11/11/2012  . Cervical dysplasia   . CHF (congestive heart failure) ([place])   . Chronic diastolic heart failure ([place]) 03/22/2016  . Chronic kidney disease    Dr. Posey Pronto follows.  . Chronic renal insufficiency, stage III (moderate) ([place]) 11/11/2012  . Complication of anesthesia    "I wake up during surgeries" (02/14/2013)  . Coronary artery disease   . Coronary artery disease involving coronary bypass graft of native heart with angina pectoris (  [place]) 06/18/2011  . Depression   . DM (diabetes mellitus) ([place]) 11/11/2012  . Fibroid   . Function kidney decreased   . GERD (gastroesophageal reflux disease)   . Hemiplegia, unspecified, affecting nondominant side 11/11/2012  . Hepatitis    Hepatitis A -college yrs"water source exposure"  .  Hiatal hernia   . History of shingles    2-3 yrs ago last out break "around waist"  . History of stomach ulcers   . HTN (hypertension) 11/11/2012  . Hyperlipidemia 07/30/2016  . Hypertension   . ICD (implantable cardiac defibrillator) in place   . Iron deficiency anemia   . Ischemic cardiomyopathy    status post biventricular ICD placed by DR Edumunds who used to see Dr Melvern Banker here to establish  cardiovascular care.  . Migraines   . MVP (mitral valve prolapse)    Antibiotics not required for procedures  . Myocardial infarction ([place])    "I've had 2; the others they were able to catch before completing" (02/14/2013)  . Pacemaker   . Paroxysmal SVT (supraventricular tachycardia) ([place]) 06/18/2011  . Pneumonia 1950's & 1985  . Shortness of breath    "lying down flat; at times w/exertion" (02/14/2013). 10-06-15 exertion only..  . Stroke [facility])    "2 confirmed; 9 TIA's; results in dragging LLE; numbness in tip of tongue" (02/14/2013),10-06-15 right hand tends to be weaker when tired.  [name] TIA (transient ischemic attack) 11/11/2012  . Type II diabetes mellitus ([place])     Past Surgical History:  Procedure Laterality Date  . ABDOMINAL HYSTERECTOMY  1985   TAH   . BIV ICD GENERTAOR CHANGE OUT  06/2007; 04/2008   "2 lead initial placement, at [facility]; done at [facility], after developing CHF" (02/14/2013)  . BIV PACEMAKER GENERATOR CHANGE OUT N/A 01/29/2015   Procedure: BIV PACEMAKER GENERATOR CHANGE OUT;  Surgeon: Evans Lance, MD;  Location: [facility] CATH LAB;  Service: Cardiovascular;  Laterality: N/A;  . BREAST EXCISIONAL BIOPSY Left 01/2007; 06/2007; 03/2008   "benign" (02/14/2013)  . BREAST SURGERY    . CARDIAC CATHETERIZATION     "probably in the teens" (02/14/2013)  . CARDIAC DEFIBRILLATOR PLACEMENT    . COLONOSCOPY  ~ 2002  . COLONOSCOPY WITH PROPOFOL N/A 10/07/2015   Procedure: COLONOSCOPY WITH PROPOFOL;  Surgeon: Juanita Craver, MD;  Location: [facility] ENDOSCOPY;  Service: Endoscopy;  Laterality: N/A;  . CORONARY ANGIOPLASTY  WITH STENT PLACEMENT     "started out w/5; bypass corrected some; 1 stent since the bypass" (02/14/2013)  . CORONARY ARTERY BYPASS GRAFT  ` 1998   LIMA-LAD, SVG-D1, SVG-PDA  . [place] OF UTERUS  1975 X 2; 1976; 1977  . INSERT / REPLACE / REMOVE PACEMAKER     biventricular defibrillator--06/10/ 2009  . LEFT HEART CATH AND CORS/GRAFTS ANGIOGRAPHY N/A 08/04/2018   Procedure: LEFT HEART CATH AND CORS/GRAFTS ANGIOGRAPHY;  Surgeon: Burnell Blanks, MD;  Location: [facility] CV LAB;  Service: Cardiovascular;  Laterality: N/A;  . LEFT HEART CATHETERIZATION WITH CORONARY/GRAFT ANGIOGRAM N/A 01/03/2015   Procedure: LEFT HEART CATHETERIZATION WITH Beatrix Fetters;  Surgeon: Sinclair Grooms, MD;  Location: [facility] CATH LAB;  Service: Cardiovascular;  Laterality: N/A;  . SUPRAVENTRICULAR TACHYCARDIA ABLATION  06/2007  . TEE WITHOUT CARDIOVERSION  11/14/2012   Procedure: TRANSESOPHAGEAL ECHOCARDIOGRAM (TEE);  Surgeon: Candee Furbish, MD;  Location: [facility] ENDOSCOPY;  Service: Cardiovascular;  Laterality: N/A;    Current Outpatient Medications  Medication Sig Dispense Refill  . albuterol (PROVENTIL HFA;VENTOLIN HFA) 108 (90 BASE)  MCG/ACT inhaler Inhale 2 puffs into the lungs every 4 (four) hours as needed for wheezing or shortness of breath.    . ALPRAZolam (XANAX) 0.5 MG tablet TAKE 1 TABLET BY  MOUTH IN THE A.M, TAKE 1 TABLET BY MOUTH AT NOON, AND TAKE 1/2 TABLET BY MOUTH AT BEDTIME    . Artificial Tear GEL Place 2 drops into both eyes daily as needed (dry eyes).     [name] aspirin 325 MG tablet Take 325 mg by mouth daily.    [name] BIOTIN PO Take by mouth daily.    . cetirizine (ZYRTEC) 10 MG tablet Take 10 mg by mouth daily.    . Cholecalciferol (VITAMIN D-3) 1000 UNITS CAPS Take 1 capsule by mouth daily.     . clopidogrel (PLAVIX) 75 MG tablet Take 1 tablet (75 mg total) by mouth daily. 90 tablet 3  . cyclobenzaprine (FLEXERIL) 5 MG tablet Take 1 tablet (5 mg total) by mouth at bedtime as  needed for muscle spasms. 30 tablet 0  . doxazosin (CARDURA) 4 MG tablet Take 4 mg by mouth at bedtime.     [name] ezetimibe (ZETIA) 10 MG tablet Take 1 tablet (10 mg total) by mouth daily. 90 tablet 3  . fluticasone (FLONASE) 50 MCG/ACT nasal spray Place 2 sprays into both nostrils daily as needed for allergies or rhinitis.     . furosemide (LASIX) 80 MG tablet Take two (2) tablets (160 mg) by mouth daily. Take one (1) additional tablet (80 mg) by mouth daily as needed for swelling.    . insulin glargine (LANTUS) 100 UNIT/ML injection Inject 18 Units into the skin at bedtime.     . isosorbide mononitrate (IMDUR) 60 MG 24 hr tablet Take 1 tablet (60 mg total) by mouth daily. 90 tablet 3  . metoprolol (TOPROL-XL) 200 MG 24 hr tablet Take 200 mg by mouth daily.    . Multiple Vitamin (MULTIVITAMIN WITH MINERALS) TABS Take 1 tablet by mouth daily.    [name] NITROSTAT 0.4 MG SL tablet Place 1 tablet under the tongue every 5 minutes as needed for chest pain, max 3 doses, go to er if no relief 25 tablet 2  . olmesartan (BENICAR) 20 MG tablet Take 1 tablet (20 mg total) by mouth daily. 90 tablet 3  . ondansetron (ZOFRAN) 8 MG tablet Take 8 mg by mouth every 8 (eight) hours as needed for nausea or vomiting.     . pantoprazole (PROTONIX) 40 MG tablet Take 40 mg by mouth daily.    . potassium chloride (K-DUR,KLOR-CON) 10 MEQ tablet TAKE 2 TABLETS BY MOUTH TWO TIMES DAILY 360 tablet 3  . Riboflavin 100 MG TABS Take 2 tablets (200 mg total) by mouth daily. 60 tablet 1  . simvastatin (ZOCOR) 20 MG tablet Take 1 tablet (20 mg total) by mouth at bedtime. 90 tablet 3  . sodium chloride (MURO 128) 5 % ophthalmic ointment Place 1 drop into both eyes at bedtime. For dry eyes    . traMADol (ULTRAM) 50 MG tablet Take 100 mg by mouth every 6 (six) hours as needed (MIGRAINES).    [name] venlafaxine (EFFEXOR) 100 MG tablet See admin instructions. TAKE 200 mg by mouth in the morning and take 100 mg at non    . vitamin A 7500 UNIT capsule  Take 2,400 Units by mouth daily.     No current facility-administered medications for this visit.     Allergies:   Calcium channel blockers; Codeine; Lyrica [pregabalin]; Other; Ramipril;  Ticlid [ticlopidine hcl]; Morphine and related; Tape; Digoxin and related; Metolazone; Myrbetriq [mirabegron]; Onglyza [saxagliptin]; Penicillins; Spironolactone; Sulfa antibiotics; Tetracyclines & related; Vicodin [hydrocodone-acetaminophen]; and Latex    Social History:  The patient  reports that she has never smoked. She has never used smokeless tobacco. She reports that she does not drink alcohol or use drugs.   Family History:  The patient's family history includes Diabetes in her brother, father, and paternal grandmother; Heart attack in her brother, father, and mother; Heart disease in her brother, father, and mother; Hypertension in her father and mother; Kidney failure in her brother and father; Stroke in her father and mother.   ROS:  Please see the history of present illness. Otherwise, review of systems are positive for none.   All other systems are reviewed and negative.   PHYSICAL EXAM: VS:  BP 140/90   Pulse 76   Ht 5' 4.56" (1.64 m)   Wt 176 lb (79.8 kg)   SpO2 97%   BMI 29.69 kg/m  , BMI Body mass index is 29.69 kg/m.  General: Well developed, well nourished, NAD Skin: Warm, dry, intact  Head: Normocephalic, atraumatic, clear, moist mucus membranes. Neck: Negative for carotid bruits. No JVD Lungs:Clear to ausculation bilaterally. No wheezes, rales, or rhonchi. Breathing is unlabored. Cardiovascular: RRR with S1 S2. + murmur. No rubs, gallops, or LV heave appreciated. Abdomen: Soft, non-tender, non-distended with normoactive bowel sounds.No obvious abdominal masses. MSK: Strength and tone appear normal for age. 5/5 in all extremities Extremities: No edema. No clubbing or cyanosis. DP/PT pulses 2+ bilaterally Neuro: Alert and oriented. No focal deficits. No facial asymmetry. MAE  spontaneously. Psych: Responds to questions appropriately with normal affect.    EKG:  EKG is not ordered today.  Recent Labs: 08/05/2018: BUN 13; Creatinine, Ser 2.03; Hemoglobin 10.1; Platelets 149; Potassium 3.8; Sodium 140   Lipid Panel    Component Value Date/Time   CHOL 133 01/02/2016 0730   TRIG 131 01/02/2016 0730   HDL 41 01/02/2016 0730   CHOLHDL 3.2 01/02/2016 0730   VLDL 26 01/02/2016 0730   LDLCALC 66 01/02/2016 0730    Wt Readings from Last 3 Encounters:  08/15/18 176 lb (79.8 kg)  08/05/18 171 lb 9.6 oz (77.8 kg)  05/05/18 169 lb 6.4 oz (76.8 kg)     Other studies Reviewed: Additional studies/ records that were reviewed today include:   Cardiac catheterization 08/04/2018:  Severe native vessel CAD s/p 3V CABG with 3/3 patent grafts 2. Mild distal left main stenosis 3. Severe mid LAD stenosis. Antegrade flow and retrograde flow in the mid and distal LAD. The LIMA to the mid LAD is patent. The Diagonal is occluded. The SVG to the Diagonal is patent.  4. The Circumflex is a large caliber artery with mild plaque in the mid segment. The distal AV groove Circumflex has chronic diffuse stenosis 5. The RCA is chronically occluded in the proximal segment. The SVG to the PDA is patent.   Recommendations: Continue medical management of CAD.   Echocardiogram 01/02/2016: Study Conclusions  - Left ventricle: The cavity size was normal. Systolic function was   mildly reduced. The estimated ejection fraction was in the range   of 45% to 50%. Diffuse hypokinesis. Features are consistent with   a pseudonormal left ventricular filling pattern, with concomitant   abnormal relaxation and increased filling pressure (grade 2   diastolic dysfunction). - Mitral valve: There was moderate regurgitation. - Left atrium: The atrium was moderately dilated. - Right  ventricle: Poorly visualized. - Tricuspid valve: There was mild regurgitation.  ASSESSMENT AND PLAN:  1. CAD with  prior hx of CABGx 3 1998: -Recently abnormal Lexiscan Myoview 08/03/18 with potential small area of pharmacologically induced ischemia involving the anterior wall of the left ventricle and small area of prior infarction involving the apex of the left ventricle with mild global hypokinesia. -Subsequent heart cath on 08/04/18 found to be essentially unchanged from prior angiogram (patent grafts, severe native disease) with recommendations for continued medical management  -She continues to have mild "twinges" of chest pain however she reports that these are directly related to her personal stress/anxiety level.  -I have offered to increase her Imdur and/or add Ranexa given her essentially unchanged cath however she declined and states that she would like to get her outside stressors under control before medication titration. She is to see Dr. Tamala Julian next month and her progress will need reassessing   -Continue participation in cardiac rehab with low anginal threshold  -Will need to see if ASA dose can be lowered per neurology -Continue Plavix  -Imdur 60 mg daily, Toprol-XL 200  2. Essential HTN: -Mildly elevated, 140/90 -Continue current regimen of Toprol-XL 200, Benicar -Likely elevated secondary to increased anxiety level  3. HLD: -Last LDL, 92 on 03/2018, not currently at goal of <70 -Will obtain lipid profile today  -Continue Zetia, statin   4. Chronic diastolic heart failure: -Last echocardiogram with LVEF of 45-50% with diffuse hypokinesis and moderate MR -Stable, no SOB and does not appear to be fluid volume overloaded on exam  -Continue Lasix 160mg  daily>>K+ supplementation with 74meq daily  -Continue Benicar   5. Hx of TIA: -Stable, no s/s -Followed by neurology  -On ASA 325mg , Plavix    6. Ischemic cardiomyopathy s/p ICD: -Last LVEF 45-50% per echo -Device check, remote  -Has follow up with Dr. Lovena Le next month   7. CKD stage IV: -Creatinine , 2.03 on discharge from  hospital on 08/05/2018 with baseline in the 1.5 range -Will obtain BMET today  -Avoid nephrotoxic medications   8. Anxiety: -Reports increased level of personal anxiety and lots of personal issues that she is "working through" with a counselor -Reports mild "chest twinges" occasionally that happen when she is under more stress -Offered to increase Imdur and/or add Ranexa however she declined and wishes to work through some issues first and if this does not help, then will consider medication titration  -She understands to call office or report to the ED if chest pain worsens or becomes more frequent.   Current medicines are reviewed at length with the patient today.  The patient does not have concerns regarding medicines.  The following changes have been made:  no change  Labs/ tests ordered today include: Lipid panel, BMET   Orders Placed This Encounter  Procedures  . Lipid panel  . Basic metabolic panel   Disposition: Has FU with Dr. Tamala Julian in 1 month  Signed, Kathyrn Drown, NP  08/15/2018 12:28 PM    [address], [place], [place]  [address] Phone: [phone]; Fax: [phone]

## 2018-08-15 ENCOUNTER — Encounter: Payer: Self-pay | Admitting: Cardiology

## 2018-08-15 ENCOUNTER — Ambulatory Visit: Payer: Medicare HMO | Admitting: Cardiology

## 2018-08-15 VITALS — BP 140/90 | HR 76 | Ht 64.56 in | Wt 176.0 lb

## 2018-08-15 DIAGNOSIS — I255 Ischemic cardiomyopathy: Secondary | ICD-10-CM | POA: Diagnosis not present

## 2018-08-15 DIAGNOSIS — I25709 Atherosclerosis of coronary artery bypass graft(s), unspecified, with unspecified angina pectoris: Secondary | ICD-10-CM

## 2018-08-15 DIAGNOSIS — Z09 Encounter for follow-up examination after completed treatment for conditions other than malignant neoplasm: Secondary | ICD-10-CM

## 2018-08-15 DIAGNOSIS — F419 Anxiety disorder, unspecified: Secondary | ICD-10-CM | POA: Diagnosis not present

## 2018-08-15 NOTE — Patient Instructions (Signed)
Medication Instructions:  Your physician recommends that you continue on your current medications as directed. Please refer to the Current Medication list given to you today.  If you need a refill on your cardiac medications before your next appointment, please call your pharmacy.   Lab work: TODAY:  LIPID & BMET  If you have labs (blood work) drawn today and your tests are completely normal, you will receive your results only by: [name] MyChart Message (if you have MyChart) OR . A paper copy in the mail If you have any lab test that is abnormal or we need to change your treatment, we will call you to review the results.  Testing/Procedures: None ordered  Follow-Up: At [facility], you and your health needs are our priority.  As part of our continuing mission to provide you with exceptional heart care, we have created designated Provider Care Teams.  These Care Teams include your primary Cardiologist (physician) and Advanced Practice Providers (APPs -  Physician Assistants and Nurse Practitioners) who all work together to provide you with the care you need, when you need it. You will need a follow up appointment in [place].  YOU ARE DUE TO SEE DR. Lovena Le IN November AS WELL. WILL GET THAT SCHEDULED BEFORE YOU LEAVE. Any Other Special Instructions Will Be Listed Below (If Applicable).

## 2018-08-16 ENCOUNTER — Encounter (HOSPITAL_COMMUNITY): Payer: Medicare HMO

## 2018-08-16 LAB — LIPID PANEL
Chol/HDL Ratio: 3.6 ratio (ref 0.0–4.4)
Cholesterol, Total: 176 mg/dL (ref 100–199)
HDL: 49 mg/dL (ref >39)
LDL CALC: 72 mg/dL (ref 0–99)
Triglycerides: 276 mg/dL — ABNORMAL HIGH (ref 0–149)
VLDL CHOLESTEROL CAL: 55 mg/dL — AB (ref 5–40)

## 2018-08-16 LAB — BASIC METABOLIC PANEL
BUN/Creatinine Ratio: 10 — ABNORMAL LOW (ref 12–28)
BUN: 20 mg/dL (ref 8–27)
CO2: 26 mmol/L (ref 20–29)
CREATININE: 2.04 mg/dL — AB (ref 0.57–1.00)
Calcium: 9.5 mg/dL (ref 8.7–10.3)
Chloride: 98 mmol/L (ref 96–106)
GFR calc Af Amer: 29 mL/min/{1.73_m2} — ABNORMAL LOW (ref >59)
GFR calc non Af Amer: 25 mL/min/{1.73_m2} — ABNORMAL LOW (ref >59)
GLUCOSE: 140 mg/dL — AB (ref 65–99)
POTASSIUM: 3.9 mmol/L (ref 3.5–5.2)
SODIUM: 141 mmol/L (ref 134–144)

## 2018-08-17 DIAGNOSIS — F411 Generalized anxiety disorder: Secondary | ICD-10-CM | POA: Diagnosis not present

## 2018-08-17 DIAGNOSIS — F332 Major depressive disorder, recurrent severe without psychotic features: Secondary | ICD-10-CM | POA: Diagnosis not present

## 2018-08-18 ENCOUNTER — Encounter (HOSPITAL_COMMUNITY): Payer: Medicare HMO

## 2018-08-18 ENCOUNTER — Other Ambulatory Visit: Payer: Self-pay | Admitting: Cardiology

## 2018-08-18 MED ORDER — SIMVASTATIN 20 MG PO TABS: 40.0000 mg | ORAL_TABLET | Freq: Every day | ORAL | 3 refills | Status: DC

## 2018-08-21 ENCOUNTER — Encounter (HOSPITAL_COMMUNITY): Payer: Medicare HMO

## 2018-08-23 ENCOUNTER — Encounter (HOSPITAL_COMMUNITY)
Admission: RE | Admit: 2018-08-23 | Discharge: 2018-08-23 | Disposition: A | Discharge status: Home / Self Care | Payer: Medicare HMO | Source: Ambulatory Visit | Attending: Interventional Cardiology | Admitting: Interventional Cardiology

## 2018-08-23 ENCOUNTER — Encounter: Payer: Self-pay | Admitting: Cardiology

## 2018-08-24 DIAGNOSIS — F411 Generalized anxiety disorder: Secondary | ICD-10-CM | POA: Diagnosis not present

## 2018-08-24 DIAGNOSIS — F332 Major depressive disorder, recurrent severe without psychotic features: Secondary | ICD-10-CM | POA: Diagnosis not present

## 2018-08-25 ENCOUNTER — Encounter (HOSPITAL_COMMUNITY): Payer: Medicare HMO

## 2018-08-28 ENCOUNTER — Encounter (HOSPITAL_COMMUNITY): Payer: Medicare HMO

## 2018-08-29 ENCOUNTER — Ambulatory Visit: Payer: Medicare HMO | Admitting: Interventional Cardiology

## 2018-08-30 ENCOUNTER — Encounter (HOSPITAL_COMMUNITY)
Admission: RE | Admit: 2018-08-30 | Discharge: 2018-08-30 | Disposition: A | Discharge status: Home / Self Care | Payer: Medicare HMO | Source: Ambulatory Visit | Attending: Interventional Cardiology | Admitting: Interventional Cardiology

## 2018-09-01 ENCOUNTER — Encounter (HOSPITAL_COMMUNITY)
Admission: RE | Admit: 2018-09-01 | Discharge: 2018-09-01 | Disposition: A | Discharge status: Home / Self Care | Payer: Medicare HMO | Source: Ambulatory Visit | Attending: Interventional Cardiology | Admitting: Interventional Cardiology

## 2018-09-04 ENCOUNTER — Encounter (HOSPITAL_COMMUNITY)
Admission: RE | Admit: 2018-09-04 | Discharge: 2018-09-04 | Disposition: A | Discharge status: Home / Self Care | Payer: Medicare HMO | Source: Ambulatory Visit | Attending: Interventional Cardiology | Admitting: Interventional Cardiology

## 2018-09-05 LAB — CUP PACEART REMOTE DEVICE CHECK
Battery Remaining Longevity: 65 mo
Battery Voltage: 2.98 V
Brady Statistic AP VP Percent: 0.11 %
Brady Statistic AP VS Percent: 0.01 %
Brady Statistic AS VS Percent: 1.4 %
Brady Statistic RA Percent Paced: 0.12 %
Brady Statistic RV Percent Paced: 0.62 %
Date Time Interrogation Session: 20191007062704
HighPow Impedance: 41 Ohm
HighPow Impedance: 52 Ohm
Implantable Lead Implant Date: 20090610
Implantable Lead Implant Date: 20090610
Implantable Lead Location: 753858
Implantable Lead Location: 753859
Implantable Lead Location: 753860
Implantable Lead Model: 5076
Lead Channel Impedance Value: 456 Ohm
Lead Channel Impedance Value: 551 Ohm
Lead Channel Impedance Value: 703 Ohm
Lead Channel Pacing Threshold Amplitude: 0.75 V
Lead Channel Pacing Threshold Amplitude: 0.875 V
Lead Channel Pacing Threshold Amplitude: 1.125 V
Lead Channel Pacing Threshold Pulse Width: 0.4 ms
Lead Channel Pacing Threshold Pulse Width: 0.4 ms
Lead Channel Pacing Threshold Pulse Width: 0.4 ms
Lead Channel Sensing Intrinsic Amplitude: 2.25 mV
Lead Channel Sensing Intrinsic Amplitude: 2.25 mV
Lead Channel Setting Pacing Amplitude: 1.5 V
Lead Channel Setting Pacing Amplitude: 2 V
Lead Channel Setting Sensing Sensitivity: 0.3 mV
MDC IDC LEAD IMPLANT DT: 20090610
MDC IDC MSMT LEADCHNL LV IMPEDANCE VALUE: 285 Ohm
MDC IDC MSMT LEADCHNL RV IMPEDANCE VALUE: 399 Ohm
MDC IDC MSMT LEADCHNL RV IMPEDANCE VALUE: 513 Ohm
MDC IDC MSMT LEADCHNL RV SENSING INTR AMPL: 3.75 mV
MDC IDC MSMT LEADCHNL RV SENSING INTR AMPL: 3.75 mV
MDC IDC PG IMPLANT DT: 20160323
MDC IDC SET LEADCHNL LV PACING AMPLITUDE: 2.25 V
MDC IDC SET LEADCHNL LV PACING PULSEWIDTH: 0.4 ms
MDC IDC SET LEADCHNL RV PACING PULSEWIDTH: 0.4 ms
MDC IDC STAT BRADY AS VP PERCENT: 98.48 %

## 2018-09-06 ENCOUNTER — Encounter (HOSPITAL_COMMUNITY): Payer: Medicare HMO

## 2018-09-11 ENCOUNTER — Encounter (HOSPITAL_COMMUNITY)
Admission: RE | Admit: 2018-09-11 | Discharge: 2018-09-11 | Disposition: A | Discharge status: Home / Self Care | Payer: Medicare HMO | Source: Ambulatory Visit | Attending: Interventional Cardiology | Admitting: Interventional Cardiology

## 2018-09-11 DIAGNOSIS — I252 Old myocardial infarction: Secondary | ICD-10-CM | POA: Insufficient documentation

## 2018-09-11 DIAGNOSIS — I255 Ischemic cardiomyopathy: Secondary | ICD-10-CM | POA: Diagnosis not present

## 2018-09-11 DIAGNOSIS — E119 Type 2 diabetes mellitus without complications: Secondary | ICD-10-CM | POA: Insufficient documentation

## 2018-09-11 DIAGNOSIS — I110 Hypertensive heart disease with heart failure: Secondary | ICD-10-CM | POA: Diagnosis not present

## 2018-09-11 DIAGNOSIS — I509 Heart failure, unspecified: Secondary | ICD-10-CM | POA: Diagnosis not present

## 2018-09-11 DIAGNOSIS — Z95810 Presence of automatic (implantable) cardiac defibrillator: Secondary | ICD-10-CM | POA: Insufficient documentation

## 2018-09-11 DIAGNOSIS — Z8673 Personal history of transient ischemic attack (TIA), and cerebral infarction without residual deficits: Secondary | ICD-10-CM | POA: Insufficient documentation

## 2018-09-11 DIAGNOSIS — I2510 Atherosclerotic heart disease of native coronary artery without angina pectoris: Secondary | ICD-10-CM | POA: Diagnosis present

## 2018-09-12 ENCOUNTER — Ambulatory Visit: Payer: Medicare HMO | Admitting: Internal Medicine

## 2018-09-12 ENCOUNTER — Encounter: Payer: Self-pay | Admitting: Internal Medicine

## 2018-09-12 VITALS — BP 130/78 | HR 77 | Ht 64.0 in | Wt 175.0 lb

## 2018-09-12 DIAGNOSIS — I255 Ischemic cardiomyopathy: Secondary | ICD-10-CM | POA: Diagnosis not present

## 2018-09-12 NOTE — Patient Instructions (Addendum)
Medication Instructions:  No chanages  Labwork: none  Testing/Procedures: none  Follow-Up: Remote monitoring is used to monitor your Pacemaker of ICD from home. This monitoring reduces the number of office visits required to check your device to one time per year. It allows [place] to keep an eye on the functioning of your device to ensure it is working properly. You are scheduled for a device check from home on 11/13/18. You may send your transmission at any time that day. If you have a wireless device, the transmission will be sent automatically. After your physician reviews your transmission, you will receive a postcard with your next transmission date.  Your physician wants you to follow-up in: 1 year with Dr. Lovena Le.  You will receive a reminder letter in the mail two months in advance. If you don't receive a letter, please call our office to schedule the follow-up appointment.   Any Other Special Instructions Will Be Listed Below (If Applicable).     If you need a refill on your cardiac medications before your next appointment, please call your pharmacy.

## 2018-09-12 NOTE — Progress Notes (Signed)
HPI [name] returns today for followup. She is a pleasant 67 yo woman with an ICM, s/p CABG, LV dysfunction who is s/p ICD insertion. She was hospitalized with chest pain and an abnormal stress test and subsequent heart cath demonstrated severe 3 vessel CAD with patent grafts. Medical management was recommended. She feels well today although she is under some increased stress as she is moving. She is building another house.  Allergies  Allergen Reactions  . Calcium Channel Blockers Other (See Comments)    Cannot take non-DHP CCBs (diltiazem, verapamil) due to CHF requiring ICD  . Codeine Anaphylaxis  . Lyrica [Pregabalin]     Nightmares, and swelling  . Other Anaphylaxis    Walnuts, pecans, and ANY melons PATIENT IS A VEGETARIAN   . Ramipril Anaphylaxis and Swelling  . Ticlid [Ticlopidine Hcl] Other (See Comments)    Unprovoked bleeding while on Ticlid (doesn't think she was on ASA at the time) Takes Plavix without problems  . Morphine And Related Other (See Comments)    Severe AMS, confusion, weakness Tolerates Fentanyl, Tramadol  . Tape Dermatitis and Rash    Tolerates paper tape  . Digoxin And Related Nausea Only    Unknown  . Metolazone Other (See Comments)    Cannot remember reaction  . Myrbetriq [Mirabegron] Other (See Comments)    Aggravates migraines  . Onglyza [Saxagliptin] Other (See Comments)    Exacerbates migraines  . Penicillins Hives    Has patient had a PCN reaction causing immediate rash, facial/tongue/throat swelling, SOB or lightheadedness with hypotension: Yes Has patient had a PCN reaction causing severe rash involving mucus membranes or skin necrosis: Unknown Has patient had a PCN reaction that required hospitalization: Unknown Has patient had a PCN reaction occurring within the last 10 years: No If all of the above answers are "NO", then may proceed with Cephalosporin use.   [name] Spironolactone Other (See Comments)    Cannot remember  reaction  . Sulfa Antibiotics Hives and Other (See Comments)  . Tetracyclines & Related Other (See Comments)    Cannot remember reaction Tolerates macrolides (azithromycin)  . Vicodin [Hydrocodone-Acetaminophen] Other (See Comments)    EXTREME LETHARGY  . Latex Itching and Rash     Current Outpatient Medications  Medication Sig Dispense Refill  . albuterol (PROVENTIL HFA;VENTOLIN HFA) 108 (90 BASE) MCG/ACT inhaler Inhale 2 puffs into the lungs every 4 (four) hours as needed for wheezing or shortness of breath.    . ALPRAZolam (XANAX) 0.5 MG tablet TAKE 1 TABLET BY  MOUTH IN THE A.M, TAKE 1 TABLET BY MOUTH AT NOON, AND TAKE 1/2 TABLET BY MOUTH AT BEDTIME    . Artificial Tear GEL Place 2 drops into both eyes daily as needed (dry eyes).     [name] aspirin 325 MG tablet Take 325 mg by mouth daily.    [name] BIOTIN PO Take by mouth daily.    . cetirizine (ZYRTEC) 10 MG tablet Take 10 mg by mouth daily.    . Cholecalciferol (VITAMIN D-3) 1000 UNITS CAPS Take 1 capsule by mouth daily.     . clopidogrel (PLAVIX) 75 MG tablet Take 1 tablet (75 mg total) by mouth daily. 90 tablet 3  . cyclobenzaprine (FLEXERIL) 5 MG tablet Take 1 tablet (5 mg total) by mouth at bedtime as needed for muscle spasms. 30 tablet 0  . doxazosin (CARDURA) 4 MG tablet Take 4 mg by mouth at bedtime.     [name] ezetimibe (ZETIA) 10  MG tablet Take 1 tablet (10 mg total) by mouth daily. 90 tablet 3  . fluticasone (FLONASE) 50 MCG/ACT nasal spray Place 2 sprays into both nostrils daily as needed for allergies or rhinitis.     . furosemide (LASIX) 80 MG tablet Take two (2) tablets (160 mg) by mouth daily. Take one (1) additional tablet (80 mg) by mouth daily as needed for swelling.    . insulin glargine (LANTUS) 100 UNIT/ML injection Inject 18 Units into the skin at bedtime.     . isosorbide mononitrate (IMDUR) 60 MG 24 hr tablet Take 1 tablet (60 mg total) by mouth daily. 90 tablet 3  . metoprolol (TOPROL-XL) 200 MG 24 hr tablet Take 200 mg  by mouth daily.    . Multiple Vitamin (MULTIVITAMIN WITH MINERALS) TABS Take 1 tablet by mouth daily.    [name] NITROSTAT 0.4 MG SL tablet Place 1 tablet under the tongue every 5 minutes as needed for chest pain, max 3 doses, go to er if no relief 25 tablet 2  . olmesartan (BENICAR) 20 MG tablet Take 1 tablet (20 mg total) by mouth daily. 90 tablet 3  . ondansetron (ZOFRAN) 8 MG tablet Take 8 mg by mouth every 8 (eight) hours as needed for nausea or vomiting.     . pantoprazole (PROTONIX) 40 MG tablet Take 40 mg by mouth daily.    . potassium chloride (K-DUR,KLOR-CON) 10 MEQ tablet TAKE 2 TABLETS BY MOUTH TWO TIMES DAILY 360 tablet 3  . Riboflavin 100 MG TABS Take 2 tablets (200 mg total) by mouth daily. 60 tablet 1  . simvastatin (ZOCOR) 20 MG tablet Take 2 tablets (40 mg total) by mouth at bedtime. 90 tablet 3  . sodium chloride (MURO 128) 5 % ophthalmic ointment Place 1 drop into both eyes at bedtime. For dry eyes    . traMADol (ULTRAM) 50 MG tablet Take 100 mg by mouth every 6 (six) hours as needed (MIGRAINES).    [name] venlafaxine (EFFEXOR) 100 MG tablet See admin instructions. TAKE 200 mg by mouth in the morning and take 100 mg at non    . vitamin A 7500 UNIT capsule Take 2,400 Units by mouth daily.     No current facility-administered medications for this visit.      Past Medical History:  Diagnosis Date  . AICD (automatic cardioverter/defibrillator) present    Medtronic- Dr. Beckie Salts follows  . Anginal pain ([place])   . Anxiety   . Asthma   . Asthma   . Back pain    "pinched nerve-lower back" - Dr. Nelva Bush follows.  . Biventricular implantable cardioverter-defibrillator in situ 06/18/2011  . Cerebral infarction ([place]) 11/11/2012  . Cervical dysplasia   . CHF (congestive heart failure) ([place])   . Chronic diastolic heart failure ([place]) 03/22/2016  . Chronic kidney disease    Dr. Posey Pronto follows.  . Chronic renal insufficiency, stage III (moderate) ([place]) 11/11/2012  . Complication of anesthesia     "I wake up during surgeries" (02/14/2013)  . Coronary artery disease   . Coronary artery disease involving coronary bypass graft of native heart with angina pectoris ([place]) 06/18/2011  . Depression   . DM (diabetes mellitus) ([place]) 11/11/2012  . Fibroid   . Function kidney decreased   . GERD (gastroesophageal reflux disease)   . Hemiplegia, unspecified, affecting nondominant side 11/11/2012  . Hepatitis    Hepatitis A -college yrs"water source exposure"  . Hiatal hernia   . History of shingles  2-3 yrs ago last out break "around waist"  . History of stomach ulcers   . HTN (hypertension) 11/11/2012  . Hyperlipidemia 07/30/2016  . Hypertension   . ICD (implantable cardiac defibrillator) in place   . Iron deficiency anemia   . Ischemic cardiomyopathy    status post biventricular ICD placed by DR Edumunds who used to see Dr Melvern Banker here to establish  cardiovascular care.  . Migraines   . MVP (mitral valve prolapse)    Antibiotics not required for procedures  . Myocardial infarction ([place])    "I've had 2; the others they were able to catch before completing" (02/14/2013)  . Pacemaker   . Paroxysmal SVT (supraventricular tachycardia) ([place]) 06/18/2011  . Pneumonia 1950's & 1985  . Shortness of breath    "lying down flat; at times w/exertion" (02/14/2013). 10-06-15 exertion only..  . Stroke [facility])    "2 confirmed; 9 TIA's; results in dragging LLE; numbness in tip of tongue" (02/14/2013),10-06-15 right hand tends to be weaker when tired.  [name] TIA (transient ischemic attack) 11/11/2012  . Type II diabetes mellitus ([facility])     ROS:   All systems reviewed and negative except as noted in the HPI.   Past Surgical History:  Procedure Laterality Date  . ABDOMINAL HYSTERECTOMY  1985   TAH   . BIV ICD GENERTAOR CHANGE OUT  06/2007; 04/2008   "2 lead initial placement, [facility]; done at [facility], after developing CHF" (02/14/2013)  . BIV PACEMAKER GENERATOR CHANGE OUT N/A 01/29/2015   Procedure: BIV PACEMAKER GENERATOR  CHANGE OUT;  Surgeon: Evans Lance, MD;  Location: [facility] CATH LAB;  Service: Cardiovascular;  Laterality: N/A;  . BREAST EXCISIONAL BIOPSY Left 01/2007; 06/2007; 03/2008   "benign" (02/14/2013)  . BREAST SURGERY    . CARDIAC CATHETERIZATION     "probably in the teens" (02/14/2013)  . CARDIAC DEFIBRILLATOR PLACEMENT    . COLONOSCOPY  ~ 2002  . COLONOSCOPY WITH PROPOFOL N/A 10/07/2015   Procedure: COLONOSCOPY WITH PROPOFOL;  Surgeon: Juanita Craver, MD;  Location: [facility] ENDOSCOPY;  Service: Endoscopy;  Laterality: N/A;  . CORONARY ANGIOPLASTY WITH STENT PLACEMENT     "started out w/5; bypass corrected some; 1 stent since the bypass" (02/14/2013)  . CORONARY ARTERY BYPASS GRAFT  ` 1998   LIMA-LAD, SVG-D1, SVG-PDA  . [employer] OF UTERUS  1975 X 2; 1976; 1977  . INSERT / REPLACE / REMOVE PACEMAKER     biventricular defibrillator--06/10/ 2009  . LEFT HEART CATH AND CORS/GRAFTS ANGIOGRAPHY N/A 08/04/2018   Procedure: LEFT HEART CATH AND CORS/GRAFTS ANGIOGRAPHY;  Surgeon: Burnell Blanks, MD;  Location: [place] [facility] CV LAB;  Service: Cardiovascular;  Laterality: N/A;  . LEFT HEART CATHETERIZATION WITH CORONARY/GRAFT ANGIOGRAM N/A 01/03/2015   Procedure: LEFT HEART CATHETERIZATION WITH Beatrix Fetters;  Surgeon: Sinclair Grooms, MD;  Location: [facility] CATH LAB;  Service: Cardiovascular;  Laterality: N/A;  . SUPRAVENTRICULAR TACHYCARDIA ABLATION  06/2007  . TEE WITHOUT CARDIOVERSION  11/14/2012   Procedure: TRANSESOPHAGEAL ECHOCARDIOGRAM (TEE);  Surgeon: Candee Furbish, MD;  Location: [facility] ENDOSCOPY;  Service: Cardiovascular;  Laterality: N/A;     Family History  Problem Relation Age of Onset  . Heart disease Mother   . Hypertension Mother   . Heart attack Mother   . Stroke Mother   . Heart disease Father   . Hypertension Father   . Diabetes Father   . Kidney failure Father   . Heart attack Father   . Stroke Father   .  Heart disease Brother   . Diabetes Brother   . Kidney failure  Brother   . Heart attack Brother   . Diabetes Paternal Grandmother      Social History   Socioeconomic History  . Marital status: Married    Spouse name: [name]  . Number of children: 1  . Years of education: MA  . Highest education level: Not on file  Occupational History    Employer: UNEMPLOYED    Comment: Disablity  Social Needs  . Financial resource strain: Not on file  . Food insecurity:    Worry: Not on file    Inability: Not on file  . Transportation needs:    Medical: Not on file    Non-medical: Not on file  Tobacco Use  . Smoking status: Never Smoker  . Smokeless tobacco: Never Used  Substance and Sexual Activity  . Alcohol use: No    Alcohol/week: 0.0 standard drinks  . Drug use: No  . Sexual activity: Yes    Birth control/protection: Surgical  Lifestyle  . Physical activity:    Days per week: Not on file    Minutes per session: Not on file  . Stress: Not on file  Relationships  . Social connections:    Talks on phone: Not on file    Gets together: Not on file    Attends religious service: Not on file    Active member of club or organization: Not on file    Attends meetings of clubs or organizations: Not on file    Relationship status: Not on file  . Intimate partner violence:    Fear of current or ex partner: Not on file    Emotionally abused: Not on file    Physically abused: Not on file    Forced sexual activity: Not on file  Other Topics Concern  . Not on file  Social History Narrative   Patient lives at home with spouse.     Daughter name is Tourist information centre manager.   Caffeine Use: none     BP 130/78   Pulse 77   Ht 5\' 4"  (1.626 m)   Wt 175 lb (79.4 kg)   SpO2 98%   BMI 30.04 kg/m   Physical Exam:  Well appearing NAD HEENT: Unremarkable Neck:  No JVD, no thyromegally Lymphatics:  No adenopathy Back:  No CVA tenderness Lungs:  Clear with no wheezes HEART:  Regular rate rhythm, no murmurs, no rubs, no clicks Abd:  soft, positive bowel sounds,  no organomegally, no rebound, no guarding Ext:  2 plus pulses, no edema, no cyanosis, no clubbing Skin:  No rashes no nodules Neuro:  CN II through XII intact, motor grossly intact  EKG - njsr biv pacing  DEVICE  Normal device function.  See PaceArt for details.   Assess/Plan: 1. Chest pain - her cath suggests a non-cardiac cause. She will continue her current meds. 2. Chronic systolic heart failure - with biv pacing her EF is about 40%. She will undergo watchful waiting. 3. CAD - she has 3 vessel disease and patent grafts. She will continue her current meds. 4. ICD - her medtronic biv device is working normally. We will see her back in a year.  Mikle Bosworth.D.

## 2018-09-13 LAB — CUP PACEART INCLINIC DEVICE CHECK
Battery Remaining Longevity: 69 mo
Battery Voltage: 2.96 V
Brady Statistic AP VP Percent: 0.07 %
Brady Statistic RA Percent Paced: 0.08 %
HighPow Impedance: 38 Ohm
HighPow Impedance: 46 Ohm
Implantable Lead Implant Date: 20090610
Implantable Lead Location: 753858
Implantable Lead Location: 753860
Implantable Lead Model: 4194
Implantable Lead Model: 5076
Implantable Pulse Generator Implant Date: 20160323
Lead Channel Impedance Value: 418 Ohm
Lead Channel Impedance Value: 456 Ohm
Lead Channel Impedance Value: 665 Ohm
Lead Channel Pacing Threshold Amplitude: 0.75 V
Lead Channel Pacing Threshold Amplitude: 0.875 V
Lead Channel Pacing Threshold Pulse Width: 0.4 ms
Lead Channel Sensing Intrinsic Amplitude: 2.25 mV
Lead Channel Setting Pacing Pulse Width: 0.4 ms
Lead Channel Setting Sensing Sensitivity: 0.3 mV
MDC IDC LEAD IMPLANT DT: 20090610
MDC IDC LEAD IMPLANT DT: 20090610
MDC IDC LEAD LOCATION: 753859
MDC IDC MSMT LEADCHNL LV IMPEDANCE VALUE: 285 Ohm
MDC IDC MSMT LEADCHNL LV IMPEDANCE VALUE: 532 Ohm
MDC IDC MSMT LEADCHNL LV PACING THRESHOLD AMPLITUDE: 0.875 V
MDC IDC MSMT LEADCHNL LV PACING THRESHOLD PULSEWIDTH: 0.4 ms
MDC IDC MSMT LEADCHNL RA PACING THRESHOLD PULSEWIDTH: 0.4 ms
MDC IDC MSMT LEADCHNL RA SENSING INTR AMPL: 2.5 mV
MDC IDC MSMT LEADCHNL RV IMPEDANCE VALUE: 304 Ohm
MDC IDC MSMT LEADCHNL RV SENSING INTR AMPL: 3.625 mV
MDC IDC MSMT LEADCHNL RV SENSING INTR AMPL: 3.75 mV
MDC IDC SESS DTM: 20191105231130
MDC IDC SET LEADCHNL LV PACING AMPLITUDE: 2 V
MDC IDC SET LEADCHNL LV PACING PULSEWIDTH: 0.4 ms
MDC IDC SET LEADCHNL RA PACING AMPLITUDE: 1.5 V
MDC IDC SET LEADCHNL RV PACING AMPLITUDE: 2 V
MDC IDC STAT BRADY AP VS PERCENT: 0.01 %
MDC IDC STAT BRADY AS VP PERCENT: 98.53 %
MDC IDC STAT BRADY AS VS PERCENT: 1.39 %
MDC IDC STAT BRADY RV PERCENT PACED: 1.03 %

## 2018-09-19 DIAGNOSIS — Z1231 Encounter for screening mammogram for malignant neoplasm of breast: Secondary | ICD-10-CM | POA: Diagnosis not present

## 2018-10-03 ENCOUNTER — Ambulatory Visit: Payer: Medicare HMO | Admitting: Interventional Cardiology

## 2018-10-03 ENCOUNTER — Encounter: Payer: Self-pay | Admitting: Interventional Cardiology

## 2018-10-03 VITALS — BP 124/76 | HR 71 | Ht 64.0 in | Wt 173.6 lb

## 2018-10-03 DIAGNOSIS — I10 Essential (primary) hypertension: Secondary | ICD-10-CM | POA: Diagnosis not present

## 2018-10-03 DIAGNOSIS — I5022 Chronic systolic (congestive) heart failure: Secondary | ICD-10-CM | POA: Diagnosis not present

## 2018-10-03 DIAGNOSIS — I25709 Atherosclerosis of coronary artery bypass graft(s), unspecified, with unspecified angina pectoris: Secondary | ICD-10-CM

## 2018-10-03 DIAGNOSIS — G459 Transient cerebral ischemic attack, unspecified: Secondary | ICD-10-CM

## 2018-10-03 DIAGNOSIS — Z95810 Presence of automatic (implantable) cardiac defibrillator: Secondary | ICD-10-CM | POA: Diagnosis not present

## 2018-10-03 DIAGNOSIS — N183 Chronic kidney disease, stage 3 unspecified: Secondary | ICD-10-CM

## 2018-10-03 DIAGNOSIS — E1122 Type 2 diabetes mellitus with diabetic chronic kidney disease: Secondary | ICD-10-CM | POA: Diagnosis not present

## 2018-10-03 DIAGNOSIS — E7849 Other hyperlipidemia: Secondary | ICD-10-CM | POA: Diagnosis not present

## 2018-10-03 MED ORDER — ISOSORBIDE MONONITRATE ER 120 MG PO TB24: 120.0000 mg | ORAL_TABLET | Freq: Every day | ORAL | 3 refills | Status: DC

## 2018-10-03 MED ORDER — NITROGLYCERIN 0.4 MG SL SUBL: SUBLINGUAL_TABLET | SUBLINGUAL | 2 refills | Status: DC

## 2018-10-03 NOTE — Progress Notes (Signed)
Cardiology Office Note:    Date:  10/03/2018   ID:  [name], DOB 1951-05-25, MRN [id number]  PCP:  [name], MD  Cardiologist:  Sinclair Grooms, MD   Referring MD: [name], MD   Chief Complaint  Patient presents with  . Coronary Artery Disease  . Atrial Fibrillation  . Congestive Heart Failure    History of Present Illness:    [name] is a 67 y.o. female with a hx of CAD, CHF, defibrillator, ischemic cardiomyopathy, mitral valve disease with moderate regurgitation, diabetes mellitus II, ischemic stroke history and recent episode of angioedema.   Recent episodes of recurring chest tightness that she describes as being mild in severity.  Episodes last less than 2 to 3 minutes.  They occur spontaneously and a never precipitated by physical activity.  She denies palpitations in association.  She wonders if increasing intensity of Imdur would help control the symptom.  Recent catheterization showed no progression of disease.  No AICD discharge.  She denies orthopnea.  She does have stable dyspnea on exertion.  No lower extremity swelling.  No specific medication side effects.  Recent trip to [place] with her daughter and goddaughter.  A lot of activity occurred without any significant problems.   Past Medical History:  Diagnosis Date  . AICD (automatic cardioverter/defibrillator) present    [employer]- Dr. Beckie Salts follows  . Anginal pain ([place])   . Anxiety   . Asthma   . Asthma   . Back pain    "pinched nerve-lower back" - Dr. Nelva Bush follows.  . Biventricular implantable cardioverter-defibrillator in situ 06/18/2011  . Cerebral infarction ([place]) 11/11/2012  . Cervical dysplasia   . CHF (congestive heart failure) ([place])   . Chronic diastolic heart failure ([place]) 03/22/2016  . Chronic kidney disease    Dr. Posey Pronto follows.  . Chronic renal insufficiency, stage III (moderate) ([place]) 11/11/2012  . Complication of anesthesia    "I wake up during  surgeries" (02/14/2013)  . Coronary artery disease   . Coronary artery disease involving coronary bypass graft of native heart with angina pectoris ([place]) 06/18/2011  . Depression   . DM (diabetes mellitus) ([place]) 11/11/2012  . Fibroid   . Function kidney decreased   . GERD (gastroesophageal reflux disease)   . Hemiplegia, unspecified, affecting nondominant side 11/11/2012  . Hepatitis    Hepatitis A -college yrs"water source exposure"  . Hiatal hernia   . History of shingles    2-3 yrs ago last out break "around waist"  . History of stomach ulcers   . HTN (hypertension) 11/11/2012  . Hyperlipidemia 07/30/2016  . Hypertension   . ICD (implantable cardiac defibrillator) in place   . Iron deficiency anemia   . Ischemic cardiomyopathy    status post biventricular ICD placed by DR Edumunds who used to see Dr Melvern Banker here to establish  cardiovascular care.  . Migraines   . MVP (mitral valve prolapse)    Antibiotics not required for procedures  . Myocardial infarction ([place])    "I've had 2; the others they were able to catch before completing" (02/14/2013)  . Pacemaker   . Paroxysmal SVT (supraventricular tachycardia) ([place]) 06/18/2011  . Pneumonia 1950's & 1985  . Shortness of breath    "lying down flat; at times w/exertion" (02/14/2013). 10-06-15 exertion only..  . Stroke [facility])    "2 confirmed; 9 TIA's; results in dragging LLE; numbness in tip of tongue" (02/14/2013),10-06-15 right hand tends to be weaker when tired.  [name]  TIA (transient ischemic attack) 11/11/2012  . Type II diabetes mellitus ([place])     Past Surgical History:  Procedure Laterality Date  . ABDOMINAL HYSTERECTOMY  1985   TAH   . BIV ICD GENERTAOR CHANGE OUT  06/2007; 04/2008   "2 lead initial placement, at [facility]; done at [facility], after developing CHF" (02/14/2013)  . BIV PACEMAKER GENERATOR CHANGE OUT N/A 01/29/2015   Procedure: BIV PACEMAKER GENERATOR CHANGE OUT;  Surgeon: Evans Lance, MD;  Location: [facility] CATH LAB;  Service: Cardiovascular;   Laterality: N/A;  . BREAST EXCISIONAL BIOPSY Left 01/2007; 06/2007; 03/2008   "benign" (02/14/2013)  . BREAST SURGERY    . CARDIAC CATHETERIZATION     "probably in the teens" (02/14/2013)  . CARDIAC DEFIBRILLATOR PLACEMENT    . COLONOSCOPY  ~ 2002  . COLONOSCOPY WITH PROPOFOL N/A 10/07/2015   Procedure: COLONOSCOPY WITH PROPOFOL;  Surgeon: Juanita Craver, MD;  Location: [facility] ENDOSCOPY;  Service: Endoscopy;  Laterality: N/A;  . CORONARY ANGIOPLASTY WITH STENT PLACEMENT     "started out w/5; bypass corrected some; 1 stent since the bypass" (02/14/2013)  . CORONARY ARTERY BYPASS GRAFT  ` 1998   LIMA-LAD, SVG-D1, SVG-PDA  . [place] OF UTERUS  1975 X 2; 1976; 1977  . INSERT / REPLACE / REMOVE PACEMAKER     biventricular defibrillator--06/10/ 2009  . LEFT HEART CATH AND CORS/GRAFTS ANGIOGRAPHY N/A 08/04/2018   Procedure: LEFT HEART CATH AND CORS/GRAFTS ANGIOGRAPHY;  Surgeon: Burnell Blanks, MD;  Location: [place] CV LAB;  Service: Cardiovascular;  Laterality: N/A;  . LEFT HEART CATHETERIZATION WITH CORONARY/GRAFT ANGIOGRAM N/A 01/03/2015   Procedure: LEFT HEART CATHETERIZATION WITH Beatrix Fetters;  Surgeon: Sinclair Grooms, MD;  Location: [facility] CATH LAB;  Service: Cardiovascular;  Laterality: N/A;  . SUPRAVENTRICULAR TACHYCARDIA ABLATION  06/2007  . TEE WITHOUT CARDIOVERSION  11/14/2012   Procedure: TRANSESOPHAGEAL ECHOCARDIOGRAM (TEE);  Surgeon: Candee Furbish, MD;  Location: [facility] ENDOSCOPY;  Service: Cardiovascular;  Laterality: N/A;    Current Medications: Current Meds  Medication Sig  . albuterol (PROVENTIL HFA;VENTOLIN HFA) 108 (90 BASE) MCG/ACT inhaler Inhale 2 puffs into the lungs every 4 (four) hours as needed for wheezing or shortness of breath.  . ALPRAZolam (XANAX) 1 MG tablet TAKE 1 TABLET BY MOUTH EVERY MORNING, 1 TABLET AT LUNCH, AND 1/2 TAB AT BEDTIME  . Artificial Tear GEL Place 2 drops into both eyes daily as needed (dry eyes).   [name] aspirin 325 MG tablet Take  325 mg by mouth daily.  [name] BIOTIN PO Take by mouth daily.  . cetirizine (ZYRTEC) 10 MG tablet Take 10 mg by mouth daily.  . Cholecalciferol (VITAMIN D-3) 1000 UNITS CAPS Take 1 capsule by mouth daily.   . clopidogrel (PLAVIX) 75 MG tablet Take 1 tablet (75 mg total) by mouth daily.  . cyclobenzaprine (FLEXERIL) 5 MG tablet Take 1 tablet (5 mg total) by mouth at bedtime as needed for muscle spasms.  [name] doxazosin (CARDURA) 4 MG tablet Take 4 mg by mouth at bedtime.   [name] EPINEPHrine 0.3 mg/0.3 mL IJ SOAJ injection AS DIRECTED USE THIS IF EXPOSED TO ALLERGEN AND REACTION INJECTION 30 DAYS  . ezetimibe (ZETIA) 10 MG tablet Take 1 tablet (10 mg total) by mouth daily.  . fluticasone (FLONASE) 50 MCG/ACT nasal spray Place 2 sprays into both nostrils daily as needed for allergies or rhinitis.   . furosemide (LASIX) 80 MG tablet Take two (2) tablets (160 mg) by mouth daily. Take one (1)  additional tablet (80 mg) by mouth daily as needed for swelling.  . insulin glargine (LANTUS) 100 UNIT/ML injection Inject 18 Units into the skin at bedtime.   . metoprolol (TOPROL-XL) 200 MG 24 hr tablet Take 200 mg by mouth daily.  . Multiple Vitamin (MULTIVITAMIN WITH MINERALS) TABS Take 1 tablet by mouth daily.  . nitroGLYCERIN (NITROSTAT) 0.4 MG SL tablet Place 1 tablet under the tongue every 5 minutes as needed for chest pain, max 3 doses, go to er if no relief  . olmesartan (BENICAR) 20 MG tablet Take 1 tablet (20 mg total) by mouth daily.  . ondansetron (ZOFRAN) 8 MG tablet Take 8 mg by mouth every 8 (eight) hours as needed for nausea or vomiting.   . pantoprazole (PROTONIX) 40 MG tablet Take 40 mg by mouth daily.  . potassium chloride (K-DUR,KLOR-CON) 10 MEQ tablet TAKE 2 TABLETS BY MOUTH TWO TIMES DAILY  . simvastatin (ZOCOR) 20 MG tablet Take 2 tablets (40 mg total) by mouth at bedtime.  . sodium chloride (MURO 128) 5 % ophthalmic ointment Place 1 drop into both eyes at bedtime. For dry eyes  . traMADol (ULTRAM) 50  MG tablet Take 100 mg by mouth every 6 (six) hours as needed (MIGRAINES).  [name] venlafaxine (EFFEXOR) 100 MG tablet See admin instructions. TAKE 200 mg by mouth in the morning and take 100 mg at non  . vitamin A 7500 UNIT capsule Take 2,400 Units by mouth daily.  . [DISCONTINUED] ALPRAZolam (XANAX) 0.5 MG tablet TAKE 1 TABLET BY  MOUTH IN THE A.M, TAKE 1 TABLET BY MOUTH AT NOON, AND TAKE 1/2 TABLET BY MOUTH AT BEDTIME  . [DISCONTINUED] isosorbide mononitrate (IMDUR) 60 MG 24 hr tablet Take 1 tablet (60 mg total) by mouth daily.  . [DISCONTINUED] nitroGLYCERIN (NITROSTAT) 0.4 MG SL tablet Place 1 tablet under the tongue every 5 minutes as needed for chest pain, max 3 doses, go to er if no relief  . [DISCONTINUED] NITROSTAT 0.4 MG SL tablet Place 1 tablet under the tongue every 5 minutes as needed for chest pain, max 3 doses, go to er if no relief     Allergies:   Calcium channel blockers; Codeine; Lyrica [pregabalin]; Other; Ramipril; Ticlid [ticlopidine hcl]; Morphine and related; Tape; Digoxin and related; Metolazone; Myrbetriq [mirabegron]; Onglyza [saxagliptin]; Penicillins; Spironolactone; Sulfa antibiotics; Tetracyclines & related; Vicodin [hydrocodone-acetaminophen]; and Latex   Social History   Socioeconomic History  . Marital status: Married    Spouse name: [name]  . Number of children: 1  . Years of education: MA  . Highest education level: Not on file  Occupational History    Employer: UNEMPLOYED    Comment: Disablity  Social Needs  . Financial resource strain: Not on file  . Food insecurity:    Worry: Not on file    Inability: Not on file  . Transportation needs:    Medical: Not on file    Non-medical: Not on file  Tobacco Use  . Smoking status: Never Smoker  . Smokeless tobacco: Never Used  Substance and Sexual Activity  . Alcohol use: No    Alcohol/week: 0.0 standard drinks  . Drug use: No  . Sexual activity: Yes    Birth control/protection: Surgical  Lifestyle  .  Physical activity:    Days per week: Not on file    Minutes per session: Not on file  . Stress: Not on file  Relationships  . Social connections:    Talks on phone: Not on file  Gets together: Not on file    Attends religious service: Not on file    Active member of club or organization: Not on file    Attends meetings of clubs or organizations: Not on file    Relationship status: Not on file  Other Topics Concern  . Not on file  Social History Narrative   Patient lives at home with spouse.     Daughter name is Tourist information centre manager.   Caffeine Use: none     Family History: The patient's family history includes Diabetes in her brother, father, and paternal grandmother; Heart attack in her brother, father, and mother; Heart disease in her brother, father, and mother; Hypertension in her father and mother; Kidney failure in her brother and father; Stroke in her father and mother.  ROS:   Please see the history of present illness.    Increased anxiety.  Xanax dose has been increased.  She wonders if anxiety could be causing some of the chest discomfort.  All other systems reviewed and are negative.  EKGs/Labs/Other Studies Reviewed:    The following studies were reviewed today:  Doppler echocardiogram last performed February 2017: Study Conclusions  - Left ventricle: The cavity size was normal. Systolic function was   mildly reduced. The estimated ejection fraction was in the range   of 45% to 50%. Diffuse hypokinesis. Features are consistent with   a pseudonormal left ventricular filling pattern, with concomitant   abnormal relaxation and increased filling pressure (grade 2   diastolic dysfunction). - Mitral valve: There was moderate regurgitation. - Left atrium: The atrium was moderately dilated. - Right ventricle: Poorly visualized. - Tricuspid valve: There was mild regurgitation.   Cardiac catheterization August 04, 2018: Diagnostic Diagram          EKG:  EKG is not  performed today.  Recent Labs: 08/05/2018: Hemoglobin 10.1; Platelets 149 08/15/2018: BUN 20; Creatinine, Ser 2.04; Potassium 3.9; Sodium 141  Recent Lipid Panel    Component Value Date/Time   CHOL 176 08/15/2018 1232   TRIG 276 (H) 08/15/2018 1232   HDL 49 08/15/2018 1232   CHOLHDL 3.6 08/15/2018 1232   CHOLHDL 3.2 01/02/2016 0730   VLDL 26 01/02/2016 0730   LDLCALC 72 08/15/2018 1232    Physical Exam:    VS:  BP 124/76   Pulse 71   Ht 5\' 4"  (1.626 m)   Wt 173 lb 9.6 oz (78.7 kg)   SpO2 98%   BMI 29.80 kg/m     Wt Readings from Last 3 Encounters:  10/03/18 173 lb 9.6 oz (78.7 kg)  09/12/18 175 lb (79.4 kg)  08/15/18 176 lb (79.8 kg)     GEN: Moderate obesity. No acute distress HEENT: Normal NECK: No JVD. LYMPHATICS: No lymphadenopathy CARDIAC: RRR.  2/6 apical systolic murmur.  No rub or gallop is audible.  She does not have peripheral edema. VASCULAR: 2+ bilateral carotid and 1+ bilateral radial, no bruits are present. RESPIRATORY:  Clear to auscultation without rales, wheezing or rhonchi  ABDOMEN: Soft, non-tender, non-distended, No pulsatile mass, MUSCULOSKELETAL: No deformity  SKIN: Warm and dry NEUROLOGIC:  Alert and oriented x 3 PSYCHIATRIC:  Normal affect   ASSESSMENT:    1. Coronary artery disease involving coronary bypass graft of native heart with angina pectoris ([place])   2. Chronic systolic heart failure ([place])   3. Essential hypertension   4. TIA (transient ischemic attack)   5. Other hyperlipidemia   6. Chronic renal insufficiency, stage III (moderate) ([facility])  7. Biventricular implantable cardioverter-defibrillator in situ   8. Type 2 diabetes mellitus with stage 3 chronic kidney disease, unspecified whether long term insulin use ([facility])    PLAN:    In order of problems listed above:  1. Recurring brief episodes of chest tightness occurring at rest.  Not lasting long enough to attempt nitroglycerin use.  Not precipitated by physical activity.   However, the patient states it feels like angina.  Will increase isosorbide to 120 mg/day and follow clinically. 2. No obvious symptoms of heart failure.  Given diabetes, we need to consider the addition of SGLT2 therapy which may decrease the likelihood of recurrent episodes of CHF. 3. Blood pressure control is adequate.  I cautioned her that on the higher dose of isosorbide, blood pressure could further decrease. 4. No recent recurrences of transient neurological symptoms. 5. LDL target of 70.  Currently on Zetia and simvastatin moderate dose.  Most recent LDL was 72 in October. 6. Followed by primary care 7. Followed by device clinic 8. SGLT2 therapy should be considered as an add-on to decrease CV risk of CHF and death.  In addition to targeting a hemoglobin A1c less than 7 by conventional means, additional therapies to decrease CV risk should be considered.  SGLT2 inhibitor therapy ("-flozin" e.g. Invokana) decreases risk of heart failure development.  GLP-1 agonist therapy ("-glutide" agents e.g. Trulicity; or "-atide" agents e.g.Byetta) to reduce vascular events.  Greater than 50% of the time during this office visit was spent in education, counseling, and coordination of care related to underlying disease process and testing as outlined.  3 to 4 months clinical follow-up.  Follow-up concerning recurring chest discomfort response to increased isosorbide mononitrate is requested.   Medication Adjustments/Labs and Tests Ordered: Current medicines are reviewed at length with the patient today.  Concerns regarding medicines are outlined above.  No orders of the defined types were placed in this encounter.  Meds ordered this encounter  Medications  . DISCONTD: nitroGLYCERIN (NITROSTAT) 0.4 MG SL tablet    Sig: Place 1 tablet under the tongue every 5 minutes as needed for chest pain, max 3 doses, go to er if no relief    Dispense:  25 tablet    Refill:  2  . DISCONTD: isosorbide  mononitrate (IMDUR) 120 MG 24 hr tablet    Sig: Take 1 tablet (120 mg total) by mouth daily.    Dispense:  90 tablet    Refill:  3    Dose change  . isosorbide mononitrate (IMDUR) 120 MG 24 hr tablet    Sig: Take 1 tablet (120 mg total) by mouth daily.    Dispense:  90 tablet    Refill:  3    Dose change  . nitroGLYCERIN (NITROSTAT) 0.4 MG SL tablet    Sig: Place 1 tablet under the tongue every 5 minutes as needed for chest pain, max 3 doses, go to er if no relief    Dispense:  25 tablet    Refill:  2    Patient Instructions  Medication Instructions:  1) INCREASE Imdur to 120mg  once daily  If you need a refill on your cardiac medications before your next appointment, please call your pharmacy.   Lab work: None If you have labs (blood work) drawn today and your tests are completely normal, you will receive your results only by: [name] MyChart Message (if you have MyChart) OR . A paper copy in the mail If you have any lab test that is  abnormal or we need to change your treatment, we will call you to review the results.  Testing/Procedures: None  Follow-Up: At [facility], you and your health needs are our priority.  As part of our continuing mission to provide you with exceptional heart care, we have created designated Provider Care Teams.  These Care Teams include your primary Cardiologist (physician) and Advanced Practice Providers (APPs -  Physician Assistants and Nurse Practitioners) who all work together to provide you with the care you need, when you need it. You will need a follow up appointment in 4 months.  Please call our office 2 months in advance to schedule this appointment.  You may see Sinclair Grooms, MD or one of the following Advanced Practice Providers on your designated Care Team:   [name], NP Cecilie Kicks, NP . Kathyrn Drown, NP  Any Other Special Instructions Will Be Listed Below (If Applicable).       Signed, Sinclair Grooms, MD  10/03/2018  5:01 PM    [facility]

## 2018-10-03 NOTE — Patient Instructions (Signed)
Medication Instructions:  1) INCREASE Imdur to 120mg  once daily  If you need a refill on your cardiac medications before your next appointment, please call your pharmacy.   Lab work: None If you have labs (blood work) drawn today and your tests are completely normal, you will receive your results only by: [name] MyChart Message (if you have MyChart) OR . A paper copy in the mail If you have any lab test that is abnormal or we need to change your treatment, we will call you to review the results.  Testing/Procedures: None  Follow-Up: At [facility], you and your health needs are our priority.  As part of our continuing mission to provide you with exceptional heart care, we have created designated Provider Care Teams.  These Care Teams include your primary Cardiologist (physician) and Advanced Practice Providers (APPs -  Physician Assistants and Nurse Practitioners) who all work together to provide you with the care you need, when you need it. You will need a follow up appointment in 4 months.  Please call our office 2 months in advance to schedule this appointment.  You may see Sinclair Grooms, MD or one of the following Advanced Practice Providers on your designated Care Team:   [name], NP Cecilie Kicks, NP . Kathyrn Drown, NP  Any Other Special Instructions Will Be Listed Below (If Applicable).

## 2018-10-11 DIAGNOSIS — N183 Chronic kidney disease, stage 3 (moderate): Secondary | ICD-10-CM | POA: Diagnosis not present

## 2018-10-11 DIAGNOSIS — N189 Chronic kidney disease, unspecified: Secondary | ICD-10-CM | POA: Diagnosis not present

## 2018-10-11 DIAGNOSIS — N2581 Secondary hyperparathyroidism of renal origin: Secondary | ICD-10-CM | POA: Diagnosis not present

## 2018-10-16 DIAGNOSIS — E119 Type 2 diabetes mellitus without complications: Secondary | ICD-10-CM | POA: Diagnosis not present

## 2018-10-16 DIAGNOSIS — H04123 Dry eye syndrome of bilateral lacrimal glands: Secondary | ICD-10-CM | POA: Diagnosis not present

## 2018-10-16 DIAGNOSIS — D631 Anemia in chronic kidney disease: Secondary | ICD-10-CM | POA: Diagnosis not present

## 2018-10-16 DIAGNOSIS — N183 Chronic kidney disease, stage 3 (moderate): Secondary | ICD-10-CM | POA: Diagnosis not present

## 2018-10-16 DIAGNOSIS — N2581 Secondary hyperparathyroidism of renal origin: Secondary | ICD-10-CM | POA: Diagnosis not present

## 2018-10-16 DIAGNOSIS — I129 Hypertensive chronic kidney disease with stage 1 through stage 4 chronic kidney disease, or unspecified chronic kidney disease: Secondary | ICD-10-CM | POA: Diagnosis not present

## 2018-10-16 DIAGNOSIS — H25011 Cortical age-related cataract, right eye: Secondary | ICD-10-CM | POA: Diagnosis not present

## 2018-10-16 DIAGNOSIS — H2513 Age-related nuclear cataract, bilateral: Secondary | ICD-10-CM | POA: Diagnosis not present

## 2018-10-17 DIAGNOSIS — K600 Acute anal fissure: Secondary | ICD-10-CM | POA: Diagnosis not present

## 2018-10-17 DIAGNOSIS — K625 Hemorrhage of anus and rectum: Secondary | ICD-10-CM | POA: Diagnosis not present

## 2018-10-17 DIAGNOSIS — K5904 Chronic idiopathic constipation: Secondary | ICD-10-CM | POA: Diagnosis not present

## 2018-10-17 DIAGNOSIS — K219 Gastro-esophageal reflux disease without esophagitis: Secondary | ICD-10-CM | POA: Diagnosis not present

## 2018-10-23 ENCOUNTER — Encounter (HOSPITAL_COMMUNITY): Admission: RE | Admit: 2018-10-23 | Payer: Medicare HMO | Source: Ambulatory Visit

## 2018-10-23 DIAGNOSIS — Z95810 Presence of automatic (implantable) cardiac defibrillator: Secondary | ICD-10-CM | POA: Insufficient documentation

## 2018-10-23 DIAGNOSIS — I110 Hypertensive heart disease with heart failure: Secondary | ICD-10-CM | POA: Insufficient documentation

## 2018-10-23 DIAGNOSIS — I252 Old myocardial infarction: Secondary | ICD-10-CM | POA: Insufficient documentation

## 2018-10-23 DIAGNOSIS — E119 Type 2 diabetes mellitus without complications: Secondary | ICD-10-CM | POA: Insufficient documentation

## 2018-10-23 DIAGNOSIS — Z8673 Personal history of transient ischemic attack (TIA), and cerebral infarction without residual deficits: Secondary | ICD-10-CM | POA: Insufficient documentation

## 2018-10-23 DIAGNOSIS — I2510 Atherosclerotic heart disease of native coronary artery without angina pectoris: Secondary | ICD-10-CM | POA: Insufficient documentation

## 2018-10-23 DIAGNOSIS — I509 Heart failure, unspecified: Secondary | ICD-10-CM | POA: Insufficient documentation

## 2018-10-23 DIAGNOSIS — I255 Ischemic cardiomyopathy: Secondary | ICD-10-CM | POA: Insufficient documentation

## 2018-10-26 DIAGNOSIS — F411 Generalized anxiety disorder: Secondary | ICD-10-CM | POA: Diagnosis not present

## 2018-10-26 DIAGNOSIS — F332 Major depressive disorder, recurrent severe without psychotic features: Secondary | ICD-10-CM | POA: Diagnosis not present

## 2018-11-07 DIAGNOSIS — I2510 Atherosclerotic heart disease of native coronary artery without angina pectoris: Secondary | ICD-10-CM | POA: Diagnosis not present

## 2018-11-07 DIAGNOSIS — E7800 Pure hypercholesterolemia, unspecified: Secondary | ICD-10-CM | POA: Diagnosis not present

## 2018-11-07 DIAGNOSIS — E1151 Type 2 diabetes mellitus with diabetic peripheral angiopathy without gangrene: Secondary | ICD-10-CM | POA: Diagnosis not present

## 2018-11-07 DIAGNOSIS — Z794 Long term (current) use of insulin: Secondary | ICD-10-CM | POA: Diagnosis not present

## 2018-11-07 DIAGNOSIS — E1121 Type 2 diabetes mellitus with diabetic nephropathy: Secondary | ICD-10-CM | POA: Diagnosis not present

## 2018-11-07 DIAGNOSIS — I471 Supraventricular tachycardia: Secondary | ICD-10-CM | POA: Diagnosis not present

## 2018-11-07 DIAGNOSIS — I5022 Chronic systolic (congestive) heart failure: Secondary | ICD-10-CM | POA: Diagnosis not present

## 2018-11-07 DIAGNOSIS — Z95810 Presence of automatic (implantable) cardiac defibrillator: Secondary | ICD-10-CM | POA: Diagnosis not present

## 2018-11-07 DIAGNOSIS — I130 Hypertensive heart and chronic kidney disease with heart failure and stage 1 through stage 4 chronic kidney disease, or unspecified chronic kidney disease: Secondary | ICD-10-CM | POA: Diagnosis not present

## 2018-11-07 DIAGNOSIS — N183 Chronic kidney disease, stage 3 (moderate): Secondary | ICD-10-CM | POA: Diagnosis not present

## 2018-11-07 DIAGNOSIS — R801 Persistent proteinuria, unspecified: Secondary | ICD-10-CM | POA: Diagnosis not present

## 2018-11-07 DIAGNOSIS — E1122 Type 2 diabetes mellitus with diabetic chronic kidney disease: Secondary | ICD-10-CM | POA: Diagnosis not present

## 2018-11-07 DIAGNOSIS — E1142 Type 2 diabetes mellitus with diabetic polyneuropathy: Secondary | ICD-10-CM | POA: Diagnosis not present

## 2018-11-07 DIAGNOSIS — I10 Essential (primary) hypertension: Secondary | ICD-10-CM | POA: Diagnosis not present

## 2018-11-10 ENCOUNTER — Other Ambulatory Visit: Payer: Self-pay | Admitting: Interventional Cardiology

## 2018-11-13 ENCOUNTER — Ambulatory Visit (INDEPENDENT_AMBULATORY_CARE_PROVIDER_SITE_OTHER): Payer: Medicare HMO

## 2018-11-13 DIAGNOSIS — I255 Ischemic cardiomyopathy: Secondary | ICD-10-CM | POA: Diagnosis not present

## 2018-11-13 LAB — CUP PACEART REMOTE DEVICE CHECK
Battery Voltage: 2.98 V
Brady Statistic AP VS Percent: 0.01 %
Brady Statistic RA Percent Paced: 0.08 %
Brady Statistic RV Percent Paced: 0.56 %
HighPow Impedance: 37 Ohm
HighPow Impedance: 47 Ohm
Implantable Lead Implant Date: 20090610
Implantable Lead Implant Date: 20090610
Implantable Lead Location: 753858
Implantable Lead Location: 753860
Implantable Lead Model: 4194
Implantable Lead Model: 5076
Implantable Pulse Generator Implant Date: 20160323
Lead Channel Impedance Value: 418 Ohm
Lead Channel Pacing Threshold Pulse Width: 0.4 ms
Lead Channel Pacing Threshold Pulse Width: 0.4 ms
Lead Channel Pacing Threshold Pulse Width: 0.4 ms
Lead Channel Sensing Intrinsic Amplitude: 2.25 mV
Lead Channel Sensing Intrinsic Amplitude: 2.25 mV
Lead Channel Sensing Intrinsic Amplitude: 4 mV
Lead Channel Sensing Intrinsic Amplitude: 4 mV
Lead Channel Setting Pacing Amplitude: 1.5 V
Lead Channel Setting Pacing Amplitude: 2.25 V
Lead Channel Setting Pacing Pulse Width: 0.4 ms
MDC IDC LEAD IMPLANT DT: 20090610
MDC IDC LEAD LOCATION: 753859
MDC IDC MSMT BATTERY REMAINING LONGEVITY: 61 mo
MDC IDC MSMT LEADCHNL LV IMPEDANCE VALUE: 285 Ohm
MDC IDC MSMT LEADCHNL LV IMPEDANCE VALUE: 513 Ohm
MDC IDC MSMT LEADCHNL LV IMPEDANCE VALUE: 646 Ohm
MDC IDC MSMT LEADCHNL LV PACING THRESHOLD AMPLITUDE: 1.25 V
MDC IDC MSMT LEADCHNL RA PACING THRESHOLD AMPLITUDE: 0.75 V
MDC IDC MSMT LEADCHNL RV IMPEDANCE VALUE: 304 Ohm
MDC IDC MSMT LEADCHNL RV IMPEDANCE VALUE: 418 Ohm
MDC IDC MSMT LEADCHNL RV PACING THRESHOLD AMPLITUDE: 0.875 V
MDC IDC SESS DTM: 20200106062202
MDC IDC SET LEADCHNL RV PACING AMPLITUDE: 2 V
MDC IDC SET LEADCHNL RV PACING PULSEWIDTH: 0.4 ms
MDC IDC SET LEADCHNL RV SENSING SENSITIVITY: 0.3 mV
MDC IDC STAT BRADY AP VP PERCENT: 0.07 %
MDC IDC STAT BRADY AS VP PERCENT: 98.51 %
MDC IDC STAT BRADY AS VS PERCENT: 1.4 %

## 2018-11-14 DIAGNOSIS — K449 Diaphragmatic hernia without obstruction or gangrene: Secondary | ICD-10-CM | POA: Diagnosis not present

## 2018-11-14 DIAGNOSIS — K5904 Chronic idiopathic constipation: Secondary | ICD-10-CM | POA: Diagnosis not present

## 2018-11-14 DIAGNOSIS — K219 Gastro-esophageal reflux disease without esophagitis: Secondary | ICD-10-CM | POA: Diagnosis not present

## 2018-11-14 NOTE — Progress Notes (Signed)
Remote ICD transmission.   

## 2018-11-15 ENCOUNTER — Telehealth: Payer: Self-pay | Admitting: Interventional Cardiology

## 2018-11-15 ENCOUNTER — Other Ambulatory Visit: Payer: Self-pay | Admitting: Interventional Cardiology

## 2018-11-15 MED ORDER — SIMVASTATIN 20 MG PO TABS: 40.0000 mg | ORAL_TABLET | Freq: Every day | ORAL | 3 refills | Status: DC

## 2018-11-15 NOTE — Telephone Encounter (Signed)
Called patient about her message. Patient complaining of headaches with increased dose of Imdur 120 mg. Consulted Dr. Tamala Julian before calling her, about patient's question about breaking tablet in half. Dr. Tamala Julian stated Imdur would lose it's potency if she split the pill in half. He would like to know if the 120 mg is keeping her from having angina, and if it is, maybe she could try taking medication at night and take tylenol for headaches. Patient stated the 120 mg dose was helping with her angina. Patient stated she did go back to her 60 mg and later got angina and had to take another 60 mg, so she could related to what Dr. Tamala Julian was trying to prevent. Patient stated she would try taking the 120 mg dose at night as suggested. Patient stated if this does not work for her, she will call back. Will forward to Dr. Tamala Julian and his nurse so they are aware.

## 2018-11-15 NOTE — Telephone Encounter (Signed)
New message   Pt c/o medication issue:  1. Name of Medication:   isosorbide mononitrate (IMDUR) 120 MG 24 hr tablet     2. How are you currently taking this medication (dosage and times per day)? 60 mg tablet  1 time daily  3. Are you having a reaction (difficulty breathing--STAT)?no   4. What is your medication issue? Patient states that the 120 mg is causing her to have severe headaches. Patient wants to know okay to break tablets in half?

## 2018-11-15 NOTE — Telephone Encounter (Signed)
Pt's medication was sent to pt's pharmacy as requested. Confirmation received.  °

## 2018-11-17 ENCOUNTER — Encounter (HOSPITAL_COMMUNITY): Admission: RE | Admit: 2018-11-17 | Payer: Self-pay | Source: Ambulatory Visit

## 2018-11-17 DIAGNOSIS — I252 Old myocardial infarction: Secondary | ICD-10-CM | POA: Insufficient documentation

## 2018-11-17 DIAGNOSIS — Z8673 Personal history of transient ischemic attack (TIA), and cerebral infarction without residual deficits: Secondary | ICD-10-CM | POA: Insufficient documentation

## 2018-11-17 DIAGNOSIS — Z95810 Presence of automatic (implantable) cardiac defibrillator: Secondary | ICD-10-CM | POA: Insufficient documentation

## 2018-11-17 DIAGNOSIS — I110 Hypertensive heart disease with heart failure: Secondary | ICD-10-CM | POA: Insufficient documentation

## 2018-11-17 DIAGNOSIS — E119 Type 2 diabetes mellitus without complications: Secondary | ICD-10-CM | POA: Insufficient documentation

## 2018-11-17 DIAGNOSIS — I2510 Atherosclerotic heart disease of native coronary artery without angina pectoris: Secondary | ICD-10-CM | POA: Insufficient documentation

## 2018-11-17 DIAGNOSIS — I509 Heart failure, unspecified: Secondary | ICD-10-CM | POA: Insufficient documentation

## 2018-11-17 DIAGNOSIS — I255 Ischemic cardiomyopathy: Secondary | ICD-10-CM | POA: Insufficient documentation

## 2018-11-20 DIAGNOSIS — N183 Chronic kidney disease, stage 3 (moderate): Secondary | ICD-10-CM | POA: Diagnosis not present

## 2018-11-20 DIAGNOSIS — J209 Acute bronchitis, unspecified: Secondary | ICD-10-CM | POA: Diagnosis not present

## 2018-11-20 DIAGNOSIS — J069 Acute upper respiratory infection, unspecified: Secondary | ICD-10-CM | POA: Diagnosis not present

## 2018-11-20 DIAGNOSIS — H6123 Impacted cerumen, bilateral: Secondary | ICD-10-CM | POA: Diagnosis not present

## 2018-11-22 DIAGNOSIS — J40 Bronchitis, not specified as acute or chronic: Secondary | ICD-10-CM | POA: Diagnosis not present

## 2018-11-23 ENCOUNTER — Telehealth: Payer: Self-pay

## 2018-11-23 NOTE — Telephone Encounter (Signed)
PA submitted on Cover my meds for Simvastatin KEY: [id number]

## 2018-11-24 NOTE — Telephone Encounter (Addendum)
**Note De-Identified [name] Obfuscation** Letter received from [facility] stating that they approved the pts Simvastatin PA. Approval good until 11/08/2019  I have notified the pts pharmacy.

## 2018-11-27 ENCOUNTER — Telehealth: Payer: Self-pay | Admitting: Interventional Cardiology

## 2018-11-27 NOTE — Telephone Encounter (Signed)
Will route to Dr. Tamala Julian to see if he feels appropriate to write letter for pt to be excused from jury duty.

## 2018-11-27 NOTE — Telephone Encounter (Signed)
  Patient is on permanent disability and has been summoned for jury duty and the court told her that she needs to get an updated letter stating that she is on disability and cannot do jury duty.

## 2018-11-27 NOTE — Telephone Encounter (Signed)
Dear [name],  [name] has severe severe symptomatic ischemic and valvular heart disease. I recommend that she be excused from jury duty because of her underlying condition which could be aggravated by stress.  Please feel free to contact us for further information if needed.  Respectfully,  Illene Labrador, III, MD

## 2018-11-28 ENCOUNTER — Encounter: Payer: Self-pay | Admitting: *Deleted

## 2018-11-28 ENCOUNTER — Other Ambulatory Visit: Payer: Self-pay | Admitting: *Deleted

## 2018-11-28 MED ORDER — SIMVASTATIN 20 MG PO TABS: 40.0000 mg | ORAL_TABLET | Freq: Every day | ORAL | 1 refills | Status: DC

## 2018-11-28 NOTE — Telephone Encounter (Signed)
Letter printed.  Will have Dr. Tamala Julian sign when he returns to the office on Thursday.

## 2018-11-28 NOTE — Telephone Encounter (Signed)
Spoke with pt and made her aware that letter has been printed and Dr. Tamala Julian will be in the office Thursday to sign it.  She will come by sometime after Thursday to pick up.  Pt appreciative for call.

## 2018-12-01 NOTE — Telephone Encounter (Signed)
I don't feel I can legally do that.

## 2018-12-01 NOTE — Telephone Encounter (Signed)
Pt's daughter in today to pick up letter.  She states that the current letter is not worded properly.  She states that it has to say be excused permanently from jury duty due to her condition.  Advised I will send to Dr. Tamala Julian to see if he is ok with excusing her permanently.

## 2018-12-04 NOTE — Telephone Encounter (Signed)
Spoke with pt and made her aware.  Pt appreciative for call.

## 2018-12-06 ENCOUNTER — Encounter (HOSPITAL_COMMUNITY): Payer: Self-pay | Attending: Interventional Cardiology

## 2018-12-08 ENCOUNTER — Encounter (HOSPITAL_COMMUNITY): Payer: Self-pay

## 2018-12-11 ENCOUNTER — Encounter (HOSPITAL_COMMUNITY): Payer: Medicare HMO | Attending: Interventional Cardiology

## 2018-12-11 DIAGNOSIS — I252 Old myocardial infarction: Secondary | ICD-10-CM | POA: Insufficient documentation

## 2018-12-11 DIAGNOSIS — I110 Hypertensive heart disease with heart failure: Secondary | ICD-10-CM | POA: Diagnosis not present

## 2018-12-11 DIAGNOSIS — I2510 Atherosclerotic heart disease of native coronary artery without angina pectoris: Secondary | ICD-10-CM | POA: Diagnosis not present

## 2018-12-11 DIAGNOSIS — Z95810 Presence of automatic (implantable) cardiac defibrillator: Secondary | ICD-10-CM | POA: Diagnosis not present

## 2018-12-11 DIAGNOSIS — E119 Type 2 diabetes mellitus without complications: Secondary | ICD-10-CM | POA: Diagnosis not present

## 2018-12-11 DIAGNOSIS — I509 Heart failure, unspecified: Secondary | ICD-10-CM | POA: Diagnosis not present

## 2018-12-11 DIAGNOSIS — I255 Ischemic cardiomyopathy: Secondary | ICD-10-CM | POA: Insufficient documentation

## 2018-12-11 DIAGNOSIS — Z8673 Personal history of transient ischemic attack (TIA), and cerebral infarction without residual deficits: Secondary | ICD-10-CM | POA: Insufficient documentation

## 2018-12-13 ENCOUNTER — Encounter (HOSPITAL_COMMUNITY): Payer: Medicare HMO

## 2018-12-15 ENCOUNTER — Encounter (HOSPITAL_COMMUNITY): Payer: Medicare HMO

## 2018-12-18 ENCOUNTER — Encounter (HOSPITAL_COMMUNITY): Payer: Medicare HMO

## 2018-12-20 ENCOUNTER — Encounter (HOSPITAL_COMMUNITY): Payer: Medicare HMO

## 2018-12-22 ENCOUNTER — Encounter (HOSPITAL_COMMUNITY): Payer: Medicare HMO

## 2018-12-25 ENCOUNTER — Encounter (HOSPITAL_COMMUNITY): Payer: Medicare HMO

## 2018-12-27 ENCOUNTER — Encounter (HOSPITAL_COMMUNITY): Payer: Medicare HMO

## 2018-12-29 ENCOUNTER — Encounter (HOSPITAL_COMMUNITY): Payer: Medicare HMO

## 2019-01-01 ENCOUNTER — Encounter (HOSPITAL_COMMUNITY): Payer: Medicare HMO

## 2019-01-03 ENCOUNTER — Encounter (HOSPITAL_COMMUNITY): Payer: Medicare HMO

## 2019-01-05 ENCOUNTER — Encounter (HOSPITAL_COMMUNITY): Payer: Medicare HMO

## 2019-01-05 ENCOUNTER — Telehealth (HOSPITAL_COMMUNITY): Payer: Self-pay | Admitting: *Deleted

## 2019-01-05 DIAGNOSIS — I10 Essential (primary) hypertension: Secondary | ICD-10-CM | POA: Diagnosis not present

## 2019-01-05 DIAGNOSIS — I2510 Atherosclerotic heart disease of native coronary artery without angina pectoris: Secondary | ICD-10-CM | POA: Diagnosis not present

## 2019-01-05 DIAGNOSIS — G459 Transient cerebral ischemic attack, unspecified: Secondary | ICD-10-CM | POA: Diagnosis not present

## 2019-01-05 DIAGNOSIS — N183 Chronic kidney disease, stage 3 (moderate): Secondary | ICD-10-CM | POA: Diagnosis not present

## 2019-01-05 DIAGNOSIS — E1165 Type 2 diabetes mellitus with hyperglycemia: Secondary | ICD-10-CM | POA: Diagnosis not present

## 2019-01-05 DIAGNOSIS — I5022 Chronic systolic (congestive) heart failure: Secondary | ICD-10-CM | POA: Diagnosis not present

## 2019-01-05 DIAGNOSIS — E1142 Type 2 diabetes mellitus with diabetic polyneuropathy: Secondary | ICD-10-CM | POA: Diagnosis not present

## 2019-01-05 DIAGNOSIS — E1151 Type 2 diabetes mellitus with diabetic peripheral angiopathy without gangrene: Secondary | ICD-10-CM | POA: Diagnosis not present

## 2019-01-05 DIAGNOSIS — J4520 Mild intermittent asthma, uncomplicated: Secondary | ICD-10-CM | POA: Diagnosis not present

## 2019-01-08 ENCOUNTER — Encounter (HOSPITAL_COMMUNITY): Payer: Medicare HMO

## 2019-01-10 ENCOUNTER — Encounter (HOSPITAL_COMMUNITY): Payer: Medicare HMO

## 2019-01-12 ENCOUNTER — Encounter (HOSPITAL_COMMUNITY): Payer: Medicare HMO

## 2019-01-12 DIAGNOSIS — E1165 Type 2 diabetes mellitus with hyperglycemia: Secondary | ICD-10-CM | POA: Diagnosis not present

## 2019-01-12 DIAGNOSIS — G459 Transient cerebral ischemic attack, unspecified: Secondary | ICD-10-CM | POA: Diagnosis not present

## 2019-01-12 DIAGNOSIS — I10 Essential (primary) hypertension: Secondary | ICD-10-CM | POA: Diagnosis not present

## 2019-01-12 DIAGNOSIS — I5022 Chronic systolic (congestive) heart failure: Secondary | ICD-10-CM | POA: Diagnosis not present

## 2019-01-12 DIAGNOSIS — I2510 Atherosclerotic heart disease of native coronary artery without angina pectoris: Secondary | ICD-10-CM | POA: Diagnosis not present

## 2019-01-12 DIAGNOSIS — N183 Chronic kidney disease, stage 3 (moderate): Secondary | ICD-10-CM | POA: Diagnosis not present

## 2019-01-12 DIAGNOSIS — E1142 Type 2 diabetes mellitus with diabetic polyneuropathy: Secondary | ICD-10-CM | POA: Diagnosis not present

## 2019-01-12 DIAGNOSIS — J4520 Mild intermittent asthma, uncomplicated: Secondary | ICD-10-CM | POA: Diagnosis not present

## 2019-01-12 DIAGNOSIS — E1151 Type 2 diabetes mellitus with diabetic peripheral angiopathy without gangrene: Secondary | ICD-10-CM | POA: Diagnosis not present

## 2019-01-13 ENCOUNTER — Other Ambulatory Visit: Payer: Self-pay | Admitting: Interventional Cardiology

## 2019-01-15 ENCOUNTER — Encounter (HOSPITAL_COMMUNITY): Payer: Medicare HMO

## 2019-01-17 ENCOUNTER — Encounter (HOSPITAL_COMMUNITY): Payer: Medicare HMO

## 2019-01-19 ENCOUNTER — Encounter (HOSPITAL_COMMUNITY): Payer: Medicare HMO

## 2019-01-22 ENCOUNTER — Encounter (HOSPITAL_COMMUNITY): Payer: Medicare HMO

## 2019-01-24 ENCOUNTER — Encounter (HOSPITAL_COMMUNITY): Payer: Medicare HMO

## 2019-01-26 ENCOUNTER — Encounter (HOSPITAL_COMMUNITY): Payer: Medicare HMO

## 2019-01-29 ENCOUNTER — Encounter (HOSPITAL_COMMUNITY): Payer: Medicare HMO

## 2019-01-31 ENCOUNTER — Encounter (HOSPITAL_COMMUNITY): Payer: Medicare HMO

## 2019-02-02 ENCOUNTER — Encounter (HOSPITAL_COMMUNITY): Payer: Medicare HMO

## 2019-02-05 ENCOUNTER — Encounter (HOSPITAL_COMMUNITY): Payer: Medicare HMO

## 2019-02-07 ENCOUNTER — Encounter (HOSPITAL_COMMUNITY): Payer: Medicare HMO

## 2019-02-09 ENCOUNTER — Encounter (HOSPITAL_COMMUNITY): Payer: Medicare HMO

## 2019-02-12 ENCOUNTER — Ambulatory Visit (INDEPENDENT_AMBULATORY_CARE_PROVIDER_SITE_OTHER): Payer: Medicare HMO | Admitting: *Deleted

## 2019-02-12 ENCOUNTER — Other Ambulatory Visit: Payer: Self-pay

## 2019-02-12 ENCOUNTER — Encounter (HOSPITAL_COMMUNITY): Payer: Medicare HMO

## 2019-02-12 DIAGNOSIS — I5022 Chronic systolic (congestive) heart failure: Secondary | ICD-10-CM

## 2019-02-12 DIAGNOSIS — I255 Ischemic cardiomyopathy: Secondary | ICD-10-CM | POA: Diagnosis not present

## 2019-02-12 LAB — CUP PACEART REMOTE DEVICE CHECK
Battery Remaining Longevity: 59 mo
Battery Voltage: 2.97 V
Brady Statistic AP VP Percent: 0.25 %
Brady Statistic AP VS Percent: 0.02 %
Brady Statistic AS VP Percent: 98.32 %
Brady Statistic AS VS Percent: 1.42 %
Brady Statistic RA Percent Paced: 0.26 %
Brady Statistic RV Percent Paced: 0.49 %
Date Time Interrogation Session: 20200406071705
HighPow Impedance: 40 Ohm
HighPow Impedance: 50 Ohm
Implantable Lead Implant Date: 20090610
Implantable Lead Implant Date: 20090610
Implantable Lead Implant Date: 20090610
Implantable Lead Location: 753858
Implantable Lead Location: 753859
Implantable Lead Location: 753860
Implantable Lead Model: 4194
Implantable Lead Model: 5076
Implantable Lead Model: 6947
Implantable Pulse Generator Implant Date: 20160323
Lead Channel Impedance Value: 285 Ohm
Lead Channel Impedance Value: 361 Ohm
Lead Channel Impedance Value: 456 Ohm
Lead Channel Impedance Value: 456 Ohm
Lead Channel Impedance Value: 532 Ohm
Lead Channel Impedance Value: 722 Ohm
Lead Channel Pacing Threshold Amplitude: 0.75 V
Lead Channel Pacing Threshold Amplitude: 0.875 V
Lead Channel Pacing Threshold Amplitude: 1 V
Lead Channel Pacing Threshold Pulse Width: 0.4 ms
Lead Channel Pacing Threshold Pulse Width: 0.4 ms
Lead Channel Pacing Threshold Pulse Width: 0.4 ms
Lead Channel Sensing Intrinsic Amplitude: 2.375 mV
Lead Channel Sensing Intrinsic Amplitude: 2.375 mV
Lead Channel Sensing Intrinsic Amplitude: 3.875 mV
Lead Channel Sensing Intrinsic Amplitude: 3.875 mV
Lead Channel Setting Pacing Amplitude: 1.5 V
Lead Channel Setting Pacing Amplitude: 2 V
Lead Channel Setting Pacing Amplitude: 2 V
Lead Channel Setting Pacing Pulse Width: 0.4 ms
Lead Channel Setting Pacing Pulse Width: 0.4 ms
Lead Channel Setting Sensing Sensitivity: 0.3 mV

## 2019-02-14 ENCOUNTER — Encounter (HOSPITAL_COMMUNITY): Payer: Medicare HMO

## 2019-02-16 ENCOUNTER — Encounter (HOSPITAL_COMMUNITY): Payer: Medicare HMO

## 2019-02-19 ENCOUNTER — Encounter (HOSPITAL_COMMUNITY): Payer: Medicare HMO

## 2019-02-20 NOTE — Progress Notes (Signed)
Remote ICD transmission.   

## 2019-02-21 ENCOUNTER — Encounter (HOSPITAL_COMMUNITY): Payer: Medicare HMO

## 2019-02-23 ENCOUNTER — Encounter (HOSPITAL_COMMUNITY): Payer: Medicare HMO

## 2019-02-26 ENCOUNTER — Encounter (HOSPITAL_COMMUNITY): Payer: Medicare HMO

## 2019-02-28 ENCOUNTER — Encounter (HOSPITAL_COMMUNITY): Payer: Medicare HMO

## 2019-03-02 ENCOUNTER — Encounter (HOSPITAL_COMMUNITY): Payer: Medicare HMO

## 2019-03-05 ENCOUNTER — Encounter (HOSPITAL_COMMUNITY): Payer: Medicare HMO

## 2019-03-07 ENCOUNTER — Encounter (HOSPITAL_COMMUNITY): Payer: Medicare HMO

## 2019-03-08 ENCOUNTER — Telehealth: Payer: Self-pay | Admitting: Physician Assistant

## 2019-03-08 NOTE — Telephone Encounter (Signed)
Spoke with patient who confirmed all demographics. Patient has smart phone and uses My Chart. Will have vitals ready for visit.

## 2019-03-09 ENCOUNTER — Encounter (HOSPITAL_COMMUNITY): Payer: Medicare HMO

## 2019-03-12 ENCOUNTER — Encounter (HOSPITAL_COMMUNITY): Payer: Medicare HMO

## 2019-03-14 ENCOUNTER — Encounter (HOSPITAL_COMMUNITY): Payer: Medicare HMO

## 2019-03-14 DIAGNOSIS — I10 Essential (primary) hypertension: Secondary | ICD-10-CM | POA: Diagnosis not present

## 2019-03-14 DIAGNOSIS — E1165 Type 2 diabetes mellitus with hyperglycemia: Secondary | ICD-10-CM | POA: Diagnosis not present

## 2019-03-14 DIAGNOSIS — N183 Chronic kidney disease, stage 3 (moderate): Secondary | ICD-10-CM | POA: Diagnosis not present

## 2019-03-14 DIAGNOSIS — E1121 Type 2 diabetes mellitus with diabetic nephropathy: Secondary | ICD-10-CM | POA: Diagnosis not present

## 2019-03-14 DIAGNOSIS — I2510 Atherosclerotic heart disease of native coronary artery without angina pectoris: Secondary | ICD-10-CM | POA: Diagnosis not present

## 2019-03-14 DIAGNOSIS — J4520 Mild intermittent asthma, uncomplicated: Secondary | ICD-10-CM | POA: Diagnosis not present

## 2019-03-14 DIAGNOSIS — G459 Transient cerebral ischemic attack, unspecified: Secondary | ICD-10-CM | POA: Diagnosis not present

## 2019-03-14 DIAGNOSIS — I5022 Chronic systolic (congestive) heart failure: Secondary | ICD-10-CM | POA: Diagnosis not present

## 2019-03-14 DIAGNOSIS — E1151 Type 2 diabetes mellitus with diabetic peripheral angiopathy without gangrene: Secondary | ICD-10-CM | POA: Diagnosis not present

## 2019-03-15 ENCOUNTER — Telehealth: Payer: Self-pay | Admitting: Interventional Cardiology

## 2019-03-15 MED ORDER — ISOSORBIDE MONONITRATE ER 60 MG PO TB24: 60.0000 mg | ORAL_TABLET | Freq: Every day | ORAL | 3 refills | Status: DC

## 2019-03-15 NOTE — Telephone Encounter (Signed)
Dr. Tamala Julian said ok to decrease to 60mg  QD due to HA but chest discomfort will increase.  Spoke with pt and she states that when she increased the dose back in November she was having terrible HAs and migraines so she decreased back to 60mg  sometime in December.  Advised I will send in new script for 60mg  tablet but she will likely have some increased chest discomfort.  Pt states she has it occasional and would take an extra 60mg  on those days and it would resolve but she would have the terrible HA again.  Advised pt if any changes to contact the office.

## 2019-03-15 NOTE — Telephone Encounter (Signed)
New message   Pt c/o medication issue:  1. Name of Medication: isosorbide mononitrate (IMDUR) 120 MG 24 hr tablet  2. How are you currently taking this medication (dosage and times per day)? 1 time daily  3. Are you having a reaction (difficulty breathing--STAT)? n/a  4. What is your medication issue? Patient wants to know if can be switched back to 60 mg because the 120 mg is making her head hurt. Please advise.

## 2019-03-16 ENCOUNTER — Encounter (HOSPITAL_COMMUNITY): Payer: Medicare HMO

## 2019-03-19 ENCOUNTER — Encounter: Payer: Self-pay | Admitting: Physician Assistant

## 2019-03-19 ENCOUNTER — Telehealth (INDEPENDENT_AMBULATORY_CARE_PROVIDER_SITE_OTHER): Payer: Medicare HMO | Admitting: Physician Assistant

## 2019-03-19 ENCOUNTER — Other Ambulatory Visit: Payer: Self-pay

## 2019-03-19 ENCOUNTER — Encounter (HOSPITAL_COMMUNITY): Payer: Medicare HMO

## 2019-03-19 VITALS — Ht 64.0 in | Wt 175.0 lb

## 2019-03-19 DIAGNOSIS — I10 Essential (primary) hypertension: Secondary | ICD-10-CM

## 2019-03-19 DIAGNOSIS — I255 Ischemic cardiomyopathy: Secondary | ICD-10-CM

## 2019-03-19 DIAGNOSIS — I5022 Chronic systolic (congestive) heart failure: Secondary | ICD-10-CM

## 2019-03-19 DIAGNOSIS — I25709 Atherosclerosis of coronary artery bypass graft(s), unspecified, with unspecified angina pectoris: Secondary | ICD-10-CM

## 2019-03-19 DIAGNOSIS — N289 Disorder of kidney and ureter, unspecified: Secondary | ICD-10-CM

## 2019-03-19 NOTE — Patient Instructions (Signed)
Medication Instructions:  Your physician recommends that you continue on your current medications as directed. Please refer to the Current Medication list given to you today.  If you need a refill on your cardiac medications before your next appointment, please call your pharmacy.   Lab work: NONE  If you have labs (blood work) drawn today and your tests are completely normal, you will receive your results only by: . MyChart Message (if you have MyChart) OR . A paper copy in the mail If you have any lab test that is abnormal or we need to change your treatment, we will call you to review the results.  Testing/Procedures: NONE  Follow-Up: At [facility], you and your health needs are our priority.  As part of our continuing mission to provide you with exceptional heart care, we have created designated Provider Care Teams.  These Care Teams include your primary Cardiologist (physician) and Advanced Practice Providers (APPs -  Physician Assistants and Nurse Practitioners) who all work together to provide you with the care you need, when you need it. You will need a follow up appointment in 6 months.  Please call our office 2 months in advance to schedule this appointment.  You may see Henry W Smith III, MD or one of the following Advanced Practice Providers on your designated Care Team:   Lori Gerhardt, NP Laura Ingold, NP . Jill McDaniel, NP     

## 2019-03-19 NOTE — Progress Notes (Signed)
Virtual Visit via Video Note   This visit type was conducted due to national recommendations for restrictions regarding the COVID-19 Pandemic (e.g. social distancing) in an effort to limit this patient's exposure and mitigate transmission in our community.  Due to her co-morbid illnesses, this patient is at least at moderate risk for complications without adequate follow up.  This format is felt to be most appropriate for this patient at this time.  All issues noted in this document were discussed and addressed.  A limited physical exam was performed with this format.  Please refer to the patient's chart for her consent to telehealth for [facility].   Date:  03/19/2019   ID:  [name], DOB July 09, 1951, MRN [id number]  Patient Location: Home Provider Location: Home  PCP:  [name], MD  Cardiologist:  Sinclair Grooms, MD  Electrophysiologist: Dr. Lovena Le  Evaluation Performed:  Follow-Up Visit  Chief Complaint:  6 Months follow up  History of Present Illness:    [name] is a 68 y.o. female CAD s/p CABG 1998, ischemic cardiomyopathy s/p BiV Defibrillator [employer] (followed by Dr. Lovena Le), mitral valve disease with moderate regurgitation, DM2, CKD and ischemic stroke seen for follow up    She had a cardiopulmonary excercise study in 2017 with no ventilatory limitation with significant chronotropic incompetence. Echocardiogram in 12/2015 with LVEF up to 45-50% with G2DD, moderate MR and mild TR. Last cath 08/04/2018 with chronic severe coronary disease, unchanged from prior angiogram with multi-vessel native disease and patent LIMA to LAD, patent SVG to Dig and patent SVG to PDA.    She was doing well when last seen by Dr. Tamala Julian 09/2018.   Today patient reports doing well on cardiac standpoint.  Her weight has been stable.  She sleeps on elevated reclining bed for long period of time.  She denies lower extremity edema, PND, chest pain, shortness of breath,  melena or blood in her stool or urine.  Active doing household stuff.  Also takes care of her daughter who has severe medical condition.  The patient does not have symptoms concerning for COVID-19 infection (fever, chills, cough, or new shortness of breath).    Past Medical History:  Diagnosis Date  . AICD (automatic cardioverter/defibrillator) present    Medtr[employer]- Dr. Beckie Salts follows  . Anginal pain ([place])   . Anxiety   . Asthma   . Asthma   . Back pain    "pinched nerve-lower back" - Dr. Nelva Bush follows.  . Biventricular implantable cardioverter-defibrillator in situ 06/18/2011  . Cerebral infarction ([place]) 11/11/2012  . Cervical dysplasia   . CHF (congestive heart failure) ([place])   . Chronic diastolic heart failure ([place]) 03/22/2016  . Chronic kidney disease    Dr. Posey Pronto follows.  . Chronic renal insufficiency, stage III (moderate) ([place]) 11/11/2012  . Complication of anesthesia    "I wake up during surgeries" (02/14/2013)  . Coronary artery disease   . Coronary artery disease involving coronary bypass graft of native heart with angina pectoris ([place]) 06/18/2011  . Depression   . DM (diabetes mellitus) ([place]) 11/11/2012  . Fibroid   . Function kidney decreased   . GERD (gastroesophageal reflux disease)   . Hemiplegia, unspecified, affecting nondominant side 11/11/2012  . Hepatitis    Hepatitis A -college yrs"water source exposure"  . Hiatal hernia   . History of shingles    2-3 yrs ago last out break "around waist"  . History of stomach ulcers   .  HTN (hypertension) 11/11/2012  . Hyperlipidemia 07/30/2016  . Hypertension   . ICD (implantable cardiac defibrillator) in place   . Iron deficiency anemia   . Ischemic cardiomyopathy    status post biventricular ICD placed by DR Edumunds who used to see Dr Melvern Banker here to establish  cardiovascular care.  . Migraines   . MVP (mitral valve prolapse)    Antibiotics not required for procedures  . Myocardial infarction ([place])    "I've had 2; the  others they were able to catch before completing" (02/14/2013)  . Pacemaker   . Paroxysmal SVT (supraventricular tachycardia) ([place]) 06/18/2011  . Pneumonia 1950's & 1985  . Shortness of breath    "lying down flat; at times w/exertion" (02/14/2013). 10-06-15 exertion only..  . Stroke [facility])    "2 confirmed; 9 TIA's; results in dragging LLE; numbness in tip of tongue" (02/14/2013),10-06-15 right hand tends to be weaker when tired.  [name] TIA (transient ischemic attack) 11/11/2012  . Type II diabetes mellitus ([place])    Past Surgical History:  Procedure Laterality Date  . ABDOMINAL HYSTERECTOMY  1985   TAH   . BIV ICD GENERTAOR CHANGE OUT  06/2007; 04/2008   "2 lead initial placement, at [facility]; done at [facility], after developing CHF" (02/14/2013)  . BIV PACEMAKER GENERATOR CHANGE OUT N/A 01/29/2015   Procedure: BIV PACEMAKER GENERATOR CHANGE OUT;  Surgeon: Evans Lance, MD;  Location: [facility] C[facility] LAB;  Service: Cardiovascular;  Laterality: N/A;  . BREAST EXCISIONAL BIOPSY Left 01/2007; 06/2007; 03/2008   "benign" (02/14/2013)  . BREAST SURGERY    . CARDIAC CATHETERIZATION     "probably in the teens" (02/14/2013)  . CARDIAC DEFIBRILLATOR PLACEMENT    . COLONOSCOPY  ~ 2002  . COLONOSCOPY WITH PROPOFOL N/A 10/07/2015   Procedure: COLONOSCOPY WITH PROPOFOL;  Surgeon: Juanita Craver, MD;  Location: [facility] ENDOSCOPY;  Service: Endoscopy;  Laterality: N/A;  . CORONARY ANGIOPLASTY WITH STENT PLACEMENT     "started out w/5; bypass corrected some; 1 stent since the bypass" (02/14/2013)  . CORONARY ARTERY BYPASS GRAFT  ` 1998   LIMA-LAD, SVG-D1, SVG-PDA  . [place] OF UTERUS  1975 X 2; 1976; 1977  . INSERT / REPLACE / REMOVE PACEMAKER     biventricular defibrillator--06/10/ 2009  . LEFT HEART CATH AND CORS/GRAFTS ANGIOGRAPHY N/A 08/04/2018   Procedure: LEFT HEART CATH AND CORS/GRAFTS ANGIOGRAPHY;  Surgeon: Burnell Blanks, MD;  Location: [place] CV LAB;  Service: Cardiovascular;  Laterality: N/A;  . LEFT HEART  CATHETERIZATION WITH CORONARY/GRAFT ANGIOGRAM N/A 01/03/2015   Procedure: LEFT HEART CATHETERIZATION WITH Beatrix Fetters;  Surgeon: Sinclair Grooms, MD;  Location: [facility] CATH LAB;  Service: Cardiovascular;  Laterality: N/A;  . SUPRAVENTRICULAR TACHYCARDIA ABLATION  06/2007  . TEE WITHOUT CARDIOVERSION  11/14/2012   Procedure: TRANSESOPHAGEAL ECHOCARDIOGRAM (TEE);  Surgeon: Candee Furbish, MD;  Location: [facility] ENDOSCOPY;  Service: Cardiovascular;  Laterality: N/A;     Current Meds  Medication Sig  . albuterol (PROVENTIL HFA;VENTOLIN HFA) 108 (90 BASE) MCG/ACT inhaler Inhale 2 puffs into the lungs every 4 (four) hours as needed for wheezing or shortness of breath.  . Alcohol Swabs (B-D SINGLE USE SWABS REGULAR) PADS   . ALPRAZolam (XANAX) 1 MG tablet TAKE 1 TABLET BY MOUTH EVERY MORNING, 1 TABLET AT LUNCH, AND 1/2 TAB AT BEDTIME  . Artificial Tear GEL Place 2 drops into both eyes daily as needed (dry eyes).   [name] aspirin 325 MG tablet Take 325 mg by mouth daily.  [name]  BIOTIN PO Take by mouth daily.  . cetirizine (ZYRTEC) 10 MG tablet Take 10 mg by mouth daily.  . Cholecalciferol (VITAMIN D-3) 1000 UNITS CAPS Take 1 capsule by mouth daily.   . clopidogrel (PLAVIX) 75 MG tablet TAKE 1 TABLET EVERY DAY  . cyclobenzaprine (FLEXERIL) 5 MG tablet Take 1 tablet (5 mg total) by mouth at bedtime as needed for muscle spasms.  [name] docusate sodium (COLACE) 250 MG capsule Take 250 mg by mouth daily.  [name] doxazosin (CARDURA) 4 MG tablet Take 4 mg by mouth at bedtime.   . DROPLET PEN NEEDLES 32G X 4 MM MISC   . EPINEPHrine 0.3 mg/0.3 mL IJ SOAJ injection AS DIRECTED USE THIS IF EXPOSED TO ALLERGEN AND REACTION INJECTION 30 DAYS  . ezetimibe (ZETIA) 10 MG tablet TAKE 1 TABLET EVERY DAY  . fluticasone (FLONASE) 50 MCG/ACT nasal spray Place 2 sprays into both nostrils daily as needed for allergies or rhinitis.   . furosemide (LASIX) 80 MG tablet TAKE 1 TABLET TWICE A DAY. MAY TAKE AN ADDITIONAL 80MG  (1 TABLET) AS NEEDED  FOR EDEMA  . insulin glargine (LANTUS) 100 UNIT/ML injection Inject 18 Units into the skin at bedtime.   . isosorbide mononitrate (IMDUR) 60 MG 24 hr tablet Take 1 tablet (60 mg total) by mouth daily.  [name] Ketotifen Fumarate (ALAWAY OP) Apply to eye.  . metoprolol succinate (TOPROL-XL) 100 MG 24 hr tablet TAKE 2 TABLETS DAILY. TAKE WITH OR IMMEDIATELY FOLLOWING A MEAL  . Multiple Vitamin (MULTIVITAMIN WITH MINERALS) TABS Take 1 tablet by mouth daily.  . nitroGLYCERIN (NITROSTAT) 0.4 MG SL tablet Place 1 tablet under the tongue every 5 minutes as needed for chest pain, max 3 doses, go to er if no relief  . olmesartan (BENICAR) 20 MG tablet TAKE 1 TABLET EVERY DAY  . ondansetron (ZOFRAN) 8 MG tablet Take 8 mg by mouth every 8 (eight) hours as needed for nausea or vomiting.   . pantoprazole (PROTONIX) 40 MG tablet Take 40 mg by mouth daily.  . potassium chloride (K-DUR,KLOR-CON) 10 MEQ tablet TAKE 2 TABLETS TWICE DAILY  . simvastatin (ZOCOR) 20 MG tablet Take 2 tablets (40 mg total) by mouth at bedtime.  . sodium chloride (MURO 128) 5 % ophthalmic ointment Place 1 drop into both eyes at bedtime. For dry eyes  . traMADol (ULTRAM) 50 MG tablet Take 100 mg by mouth every 6 (six) hours as needed (MIGRAINES).  . TRUE METRIX BLOOD GLUCOSE TEST test strip   . TRUEplus Lancets 33G MISC   . venlafaxine (EFFEXOR) 100 MG tablet See admin instructions. TAKE 200 mg by mouth in the morning and take 100 mg at non  . vitamin A 7500 UNIT capsule Take 2,400 Units by mouth daily.     Allergies:   Calcium channel blockers; Codeine; Lyrica [pregabalin]; Other; Ramipril; Ticlid [ticlopidine hcl]; Morphine and related; Tape; Digoxin and related; Metolazone; Myrbetriq [mirabegron]; Onglyza [saxagliptin]; Penicillins; Spironolactone; Sulfa antibiotics; Tetracyclines & related; Vicodin [hydrocodone-acetaminophen]; and Latex   Social History   Tobacco Use  . Smoking status: Never Smoker  . Smokeless tobacco: Never Used   Substance Use Topics  . Alcohol use: No    Alcohol/week: 0.0 standard drinks  . Drug use: No     Family Hx: The patient's family history includes Diabetes in her brother, father, and paternal grandmother; Heart attack in her brother, father, and mother; Heart disease in her brother, father, and mother; Hypertension in her father and mother; Kidney failure in her  brother and father; Stroke in her father and mother.  ROS:   Please see the history of present illness.    All other systems reviewed and are negative.   Prior CV studies:   The following studies were reviewed today:  LEFT HEART CATH AND CORS/GRAFTS ANGIOGRAPHY 08/04/2018  Conclusion     Prox RCA lesion is 100% stenosed.  SVG graft was visualized by angiography and is normal in caliber.  Ost 1st Diag lesion is 100% stenosed.  SVG graft was visualized by angiography and is normal in caliber.  Prox LAD to Mid LAD lesion is 90% stenosed.  Mid LM to Dist LM lesion is 50% stenosed.  Mid Cx to Dist Cx lesion is 60% stenosed.  Ost 2nd Mrg lesion is 50% stenosed.   1. Severe native vessel CAD s/p 3V CABG with 3/3 patent grafts 2. Mild distal left main stenosis 3. Severe mid LAD stenosis. Antegrade flow and retrograde flow in the mid and distal LAD. The LIMA to the mid LAD is patent. The Diagonal is occluded. The SVG to the Diagonal is patent.  4. The Circumflex is a large caliber artery with mild plaque in the mid segment. The distal AV groove Circumflex has chronic diffuse stenosis 5. The RCA is chronically occluded in the proximal segment. The SVG to the PDA is patent.   Recommendations: Continue medical management of CAD   Echo 12/2015 Study Conclusions  - Left ventricle: The cavity size was normal. Systolic function was   mildly reduced. The estimated ejection fraction was in the range   of 45% to 50%. Diffuse hypokinesis. Features are consistent with   a pseudonormal left ventricular filling pattern, with  concomitant   abnormal relaxation and increased filling pressure (grade 2   diastolic dysfunction). - Mitral valve: There was moderate regurgitation. - Left atrium: The atrium was moderately dilated. - Right ventricle: Poorly visualized. - Tricuspid valve: There was mild regurgitation.  Labs/Other Tests and Data Reviewed:    EKG:  No ECG reviewed.  Recent Labs: 08/05/2018: Hemoglobin 10.1; Platelets 149 08/15/2018: BUN 20; Creatinine, Ser 2.04; Potassium 3.9; Sodium 141   Recent Lipid Panel Lab Results  Component Value Date/Time   CHOL 176 08/15/2018 12:32 PM   TRIG 276 (H) 08/15/2018 12:32 PM   HDL 49 08/15/2018 12:32 PM   CHOLHDL 3.6 08/15/2018 12:32 PM   CHOLHDL 3.2 01/02/2016 07:30 AM   LDLCALC 72 08/15/2018 12:32 PM    Wt Readings from Last 3 Encounters:  03/19/19 175 lb (79.4 kg)  10/03/18 173 lb 9.6 oz (78.7 kg)  09/12/18 175 lb (79.4 kg)     Objective:    Vital Signs:  Ht 5\' 4"  (1.626 m)   Wt 175 lb (79.4 kg)   BMI 30.04 kg/m    VITAL SIGNS:  reviewed GEN:  no acute distress EYES:  sclerae anicteric, EOMI - Extraocular Movements Intact RESPIRATORY:  normal respiratory effort, symmetric expansion CARDIOVASCULAR:  no peripheral edema SKIN:  no rash, lesions or ulcers. MUSCULOSKELETAL:  no obvious deformities. NEURO:  alert and oriented x 3, no obvious focal deficit PSYCH:  normal affect  ASSESSMENT & PLAN:    1. CAD s/p CABG - Last cath 07/2018 as above.  No angina.  Continue current medical therapy.  2. ICM s/p ICD -No shock or CHF exacerbating symptoms.  Weight has been stable.  No change in medical therapy.  4. HTN -No blood pressure cuff at home.  No change in therapy.  5. HLD -Continue  statin.  COVID-19 Education: The signs and symptoms of COVID-19 were discussed with the patient and how to seek care for testing (follow up with PCP or arrange E-visit).  The importance of social distancing was discussed today.  Time:   Today, I have spent 9  minutes with the patient with telehealth technology discussing the above problems.     Medication Adjustments/Labs and Tests Ordered: Current medicines are reviewed at length with the patient today.  Concerns regarding medicines are outlined above.   Tests Ordered: No orders of the defined types were placed in this encounter.   Medication Changes: No orders of the defined types were placed in this encounter.   Disposition:  Follow up in 6 month(s)  Signed, Leanor Kail, PA  03/19/2019 1:28 PM    [facility]

## 2019-03-21 ENCOUNTER — Encounter (HOSPITAL_COMMUNITY): Payer: Medicare HMO

## 2019-03-23 ENCOUNTER — Encounter (HOSPITAL_COMMUNITY): Payer: Medicare HMO

## 2019-03-26 ENCOUNTER — Encounter (HOSPITAL_COMMUNITY): Payer: Medicare HMO

## 2019-03-28 ENCOUNTER — Telehealth: Payer: Self-pay | Admitting: Interventional Cardiology

## 2019-03-28 ENCOUNTER — Encounter (HOSPITAL_COMMUNITY): Payer: Medicare HMO

## 2019-03-28 DIAGNOSIS — F332 Major depressive disorder, recurrent severe without psychotic features: Secondary | ICD-10-CM | POA: Diagnosis not present

## 2019-03-28 DIAGNOSIS — F411 Generalized anxiety disorder: Secondary | ICD-10-CM | POA: Diagnosis not present

## 2019-03-28 NOTE — Telephone Encounter (Signed)
Pt c/o BP issue: STAT if pt c/o blurred vision, one-sided weakness or slurred speech  1. What are your last 5 BP readings? At noon it was 155/96,  At 7:00 this morning 164/95*, yesterday at 2:00 163/102 and yesterday morning 165/95  2. Are you having any other symptoms (ex. Dizziness, headache, blurred vision, passed out)? Yesterday she was short of breath   3. What is your BP issue?High Blood Pressure for her

## 2019-03-28 NOTE — Telephone Encounter (Signed)
Spoke with the patient, she expressed understanding about increasing her lasix for 2 days. She will contact our office on Monday to let us know how she is feeling.

## 2019-03-28 NOTE — Telephone Encounter (Signed)
Take Furosemide 120 mg BID for 2 doses then back to 80 mg BID

## 2019-03-28 NOTE — Telephone Encounter (Signed)
Spoke with the patient, she has been having elevated blood pressures 947-125I systolic. She stated she had multiple migraines that usually raise her pressure but it stopped on 03/25/19. She usually says it gets better, but it has not. She stated she has been taking all her medications and her current weight is 173.4 lbs. She also yesterday when to the pharmacy to get her medication and walking from the front and back of the store was difficult due to her SOB. The SOB is similar to what she has had in the past. She stated she has not been out of her house in about a week so she has not had any other SOB events. She denied chest pain and swelling. She stated that she did feel dizzy when she was walking.

## 2019-03-30 ENCOUNTER — Encounter (HOSPITAL_COMMUNITY): Payer: Medicare HMO

## 2019-04-01 ENCOUNTER — Telehealth: Payer: Self-pay | Admitting: Nurse Practitioner

## 2019-04-01 DIAGNOSIS — N183 Chronic kidney disease, stage 3 unspecified: Secondary | ICD-10-CM

## 2019-04-01 NOTE — Telephone Encounter (Signed)
Addendum: I did offer to send in a Rx for benicar 40mg , but pt preferred to hold off and see if it was effective prior to Rx.  She will call us when she needs a refill.  Murray Hodgkins, NP 04/01/2019, 8:54 AM

## 2019-04-01 NOTE — Telephone Encounter (Signed)
   Pts BP has been trending in the 170's lately.  I've asked her to increase her benicar to 40mg  daily (currently on 20) and will arrange for a BMET on Friday.  Caller verbalized understanding and was grateful for the call back.  Murray Hodgkins, NP 04/01/2019, 8:39 AM

## 2019-04-04 ENCOUNTER — Encounter (HOSPITAL_COMMUNITY): Payer: Medicare HMO

## 2019-04-06 ENCOUNTER — Other Ambulatory Visit: Payer: Self-pay

## 2019-04-06 ENCOUNTER — Other Ambulatory Visit: Payer: Medicare HMO | Admitting: *Deleted

## 2019-04-06 ENCOUNTER — Other Ambulatory Visit: Payer: Self-pay | Admitting: *Deleted

## 2019-04-06 ENCOUNTER — Encounter (HOSPITAL_COMMUNITY): Payer: Medicare HMO

## 2019-04-06 DIAGNOSIS — I10 Essential (primary) hypertension: Secondary | ICD-10-CM | POA: Diagnosis not present

## 2019-04-06 DIAGNOSIS — E782 Mixed hyperlipidemia: Secondary | ICD-10-CM | POA: Diagnosis not present

## 2019-04-06 DIAGNOSIS — I25709 Atherosclerosis of coronary artery bypass graft(s), unspecified, with unspecified angina pectoris: Secondary | ICD-10-CM

## 2019-04-06 DIAGNOSIS — E1142 Type 2 diabetes mellitus with diabetic polyneuropathy: Secondary | ICD-10-CM

## 2019-04-09 ENCOUNTER — Encounter (HOSPITAL_COMMUNITY): Payer: Medicare HMO

## 2019-04-09 LAB — CBC WITH DIFFERENTIAL/PLATELET
Basophils Absolute: 0.1 10*3/uL (ref 0.0–0.2)
Basos: 1 %
EOS (ABSOLUTE): 0.1 10*3/uL (ref 0.0–0.4)
Eos: 1 %
Hematocrit: 33.8 % — ABNORMAL LOW (ref 34.0–46.6)
Hemoglobin: 11.4 g/dL (ref 11.1–15.9)
Immature Grans (Abs): 0 10*3/uL (ref 0.0–0.1)
Immature Granulocytes: 0 %
Lymphocytes Absolute: 3 10*3/uL (ref 0.7–3.1)
Lymphs: 32 %
MCH: 29.4 pg (ref 26.6–33.0)
MCHC: 33.7 g/dL (ref 31.5–35.7)
MCV: 87 fL (ref 79–97)
Monocytes Absolute: 0.9 10*3/uL (ref 0.1–0.9)
Monocytes: 9 %
Neutrophils Absolute: 5.3 10*3/uL (ref 1.4–7.0)
Neutrophils: 57 %
Platelets: 215 10*3/uL (ref 150–450)
RBC: 3.88 x10E6/uL (ref 3.77–5.28)
RDW: 13.3 % (ref 11.7–15.4)
WBC: 9.3 10*3/uL (ref 3.4–10.8)

## 2019-04-09 LAB — HEPATIC FUNCTION PANEL
ALT: 20 IU/L (ref 0–32)
AST: 29 IU/L (ref 0–40)
Albumin: 4.5 g/dL (ref 3.8–4.8)
Alkaline Phosphatase: 63 IU/L (ref 39–117)
Bilirubin Total: 0.4 mg/dL (ref 0.0–1.2)
Bilirubin, Direct: 0.11 mg/dL (ref 0.00–0.40)
Total Protein: 7.2 g/dL (ref 6.0–8.5)

## 2019-04-09 LAB — LIPID PANEL
Chol/HDL Ratio: 2.9 ratio (ref 0.0–4.4)
Cholesterol, Total: 179 mg/dL (ref 100–199)
HDL: 61 mg/dL (ref >39)
LDL Calculated: 92 mg/dL (ref 0–99)
Triglycerides: 129 mg/dL (ref 0–149)
VLDL Cholesterol Cal: 26 mg/dL (ref 5–40)

## 2019-04-09 LAB — HEMOGLOBIN A1C
Est. average glucose Bld gHb Est-mCnc: 140 mg/dL
Hgb A1c MFr Bld: 6.5 % — ABNORMAL HIGH (ref 4.8–5.6)

## 2019-04-11 ENCOUNTER — Encounter (HOSPITAL_COMMUNITY): Payer: Medicare HMO

## 2019-04-13 ENCOUNTER — Encounter (HOSPITAL_COMMUNITY): Payer: Medicare HMO

## 2019-04-16 ENCOUNTER — Encounter (HOSPITAL_COMMUNITY): Payer: Medicare HMO

## 2019-04-18 ENCOUNTER — Other Ambulatory Visit: Payer: Self-pay | Admitting: Interventional Cardiology

## 2019-04-18 ENCOUNTER — Encounter (HOSPITAL_COMMUNITY): Payer: Medicare HMO

## 2019-04-20 ENCOUNTER — Encounter (HOSPITAL_COMMUNITY): Payer: Medicare HMO

## 2019-04-23 ENCOUNTER — Encounter (HOSPITAL_COMMUNITY): Payer: Medicare HMO

## 2019-04-25 ENCOUNTER — Encounter (HOSPITAL_COMMUNITY): Payer: Medicare HMO

## 2019-04-25 ENCOUNTER — Telehealth: Payer: Self-pay | Admitting: Interventional Cardiology

## 2019-04-25 NOTE — Telephone Encounter (Signed)
Message     Pt c/o medication issue:  1. Name of Medication: Olmesartan   2. How are you currently taking this medication (dosage and times per day)? 20 mg 2x daily   3. Are you having a reaction (difficulty breathing--STAT)? No   4. What is your medication issue? Pt says her numbers are going back up for her BP she says she thinks she needs and increase on the medication  bp Today 163/96  Yesterday172/96   Please call

## 2019-04-25 NOTE — Telephone Encounter (Signed)
Pt noticed over the last few days that her BP has increased: Today- 163/96 Yesterday- 172/96 Sunday- 166/95  Has a headache that feels like she took a Nitro when pressure higher.  Denies lightheadedness, dizziness, blurred vision, CP or SOB.  Denies increased salt in diet.  Weight usually around 169lbs.  She takes an extra Lasix if she gains 3lbs.  Weight today was 172.4 so she took an extra Lasix today.  Current medications are as follows:  Olmesartan 20mg  BID Metoprolol Succ 100mg  QD Furosemide 80mg  BID- takes extra for swelling Imdur 60mg  QD Cardura 4mg  QHS  Advised I will send message to Dr. Tamala Julian for review.  Will route to triage for assistance as I am off tomorrow.

## 2019-04-26 NOTE — Telephone Encounter (Signed)
Hydralazine 25 mg BID

## 2019-04-27 ENCOUNTER — Encounter (HOSPITAL_COMMUNITY): Payer: Medicare HMO

## 2019-04-27 MED ORDER — HYDRALAZINE HCL 25 MG PO TABS: 25.0000 mg | ORAL_TABLET | Freq: Two times a day (BID) | ORAL | 3 refills | Status: DC

## 2019-04-27 NOTE — Telephone Encounter (Signed)
Spoke with pt and went over recommendation.  Sent prescription to local pharmacy.  Pt verbalized understanding and was appreciative for call.

## 2019-04-30 ENCOUNTER — Encounter (HOSPITAL_COMMUNITY): Payer: Medicare HMO

## 2019-05-02 ENCOUNTER — Encounter (HOSPITAL_COMMUNITY): Payer: Medicare HMO

## 2019-05-04 ENCOUNTER — Encounter (HOSPITAL_COMMUNITY): Payer: Medicare HMO

## 2019-05-07 ENCOUNTER — Encounter (HOSPITAL_COMMUNITY): Payer: Medicare HMO

## 2019-05-08 ENCOUNTER — Other Ambulatory Visit: Payer: Self-pay

## 2019-05-08 ENCOUNTER — Telehealth: Payer: Self-pay | Admitting: Interventional Cardiology

## 2019-05-08 MED ORDER — OLMESARTAN MEDOXOMIL 20 MG PO TABS: 40.0000 mg | ORAL_TABLET | Freq: Every day | ORAL | 0 refills | Status: DC

## 2019-05-08 NOTE — Addendum Note (Signed)
Addended by: Carylon Perches on: 05/08/2019 03:53 PM   Modules accepted: Orders

## 2019-05-08 NOTE — Telephone Encounter (Signed)
°*  STAT* If patient is at the pharmacy, call can be transferred to refill team.   1. Which medications need to be refilled? (please list name of each medication and dose if known) olmesartan (BENICAR) 20 MG tablet  See phone note dated 04/25/19  2. Which pharmacy/location (including street and city if local pharmacy) is medication to be sent to? [place], [place]  3. Do they need a 30 day or 90 day supply? 90 day  Please call in 14 day supply to [id number] [place], [place] as patient will be out of medication by tomorrow.

## 2019-05-09 ENCOUNTER — Encounter (HOSPITAL_COMMUNITY): Payer: Medicare HMO

## 2019-05-09 MED ORDER — OLMESARTAN MEDOXOMIL 20 MG PO TABS: 40.0000 mg | ORAL_TABLET | Freq: Every day | ORAL | 3 refills | Status: DC

## 2019-05-10 ENCOUNTER — Telehealth: Payer: Self-pay | Admitting: Interventional Cardiology

## 2019-05-10 ENCOUNTER — Other Ambulatory Visit: Payer: Self-pay

## 2019-05-10 MED ORDER — OLMESARTAN MEDOXOMIL 40 MG PO TABS: 40.0000 mg | ORAL_TABLET | Freq: Every day | ORAL | 1 refills | Status: DC

## 2019-05-10 NOTE — Telephone Encounter (Signed)
New Message             Whitney from [facility] is calling today to get a prior authorization for "Olmesartan 20 mg" Referance # [id number] for the patient pls advise

## 2019-05-10 NOTE — Telephone Encounter (Signed)
I changed the pts Olmesartan RX from 20 mg take 2 tablets daily to 40 mg take 1 tablet daily.  I called [employer] back and s/w Mitzi Hansen who checked the pts plan and and advised me that Olmesartan 40 mg take 1 tablet daily is covered and that the estimated cost to the pt is $7.04.  I asked Mitzi Hansen to discard the 1st RX and he states that he did and he replaced it with the 2nd.  Mitzi Hansen transferred my call to the pharmacy and I s/w Tye Maryland who advised me that the estimated cost to the pt will be zero but it depends on the pts plan. I have asked Tye Maryland to mail the pts Olmesartan to her now. Tye Maryland states they will get it in the mail once it has been processed.  I have made the pt aware of this and that her dosing instructions have changed. She is aware to take one Olmesartan 40 mg tablet daily.  She expressed much appreciation as she states that a 7 day supply of the 20 mg tablet at her local pharmacy cost her $25.

## 2019-05-14 ENCOUNTER — Ambulatory Visit (INDEPENDENT_AMBULATORY_CARE_PROVIDER_SITE_OTHER): Payer: Medicare HMO | Admitting: *Deleted

## 2019-05-14 ENCOUNTER — Encounter (HOSPITAL_COMMUNITY): Payer: Medicare HMO

## 2019-05-14 DIAGNOSIS — I255 Ischemic cardiomyopathy: Secondary | ICD-10-CM

## 2019-05-14 LAB — CUP PACEART REMOTE DEVICE CHECK
Battery Remaining Longevity: 53 mo
Battery Voltage: 2.97 V
Brady Statistic AP VP Percent: 0.16 %
Brady Statistic AP VS Percent: 0.02 %
Brady Statistic AS VP Percent: 98.46 %
Brady Statistic AS VS Percent: 1.36 %
Brady Statistic RA Percent Paced: 0.17 %
Brady Statistic RV Percent Paced: 0.46 %
Date Time Interrogation Session: 20200706062603
HighPow Impedance: 38 Ohm
HighPow Impedance: 48 Ohm
Implantable Lead Implant Date: 20090610
Implantable Lead Implant Date: 20090610
Implantable Lead Implant Date: 20090610
Implantable Lead Location: 753858
Implantable Lead Location: 753859
Implantable Lead Location: 753860
Implantable Lead Model: 4194
Implantable Lead Model: 5076
Implantable Lead Model: 6947
Implantable Pulse Generator Implant Date: 20160323
Lead Channel Impedance Value: 285 Ohm
Lead Channel Impedance Value: 342 Ohm
Lead Channel Impedance Value: 418 Ohm
Lead Channel Impedance Value: 456 Ohm
Lead Channel Impedance Value: 513 Ohm
Lead Channel Impedance Value: 665 Ohm
Lead Channel Pacing Threshold Amplitude: 0.75 V
Lead Channel Pacing Threshold Amplitude: 0.875 V
Lead Channel Pacing Threshold Amplitude: 1 V
Lead Channel Pacing Threshold Pulse Width: 0.4 ms
Lead Channel Pacing Threshold Pulse Width: 0.4 ms
Lead Channel Pacing Threshold Pulse Width: 0.4 ms
Lead Channel Sensing Intrinsic Amplitude: 2 mV
Lead Channel Sensing Intrinsic Amplitude: 2 mV
Lead Channel Sensing Intrinsic Amplitude: 4 mV
Lead Channel Sensing Intrinsic Amplitude: 4 mV
Lead Channel Setting Pacing Amplitude: 1.75 V
Lead Channel Setting Pacing Amplitude: 2 V
Lead Channel Setting Pacing Amplitude: 2 V
Lead Channel Setting Pacing Pulse Width: 0.4 ms
Lead Channel Setting Pacing Pulse Width: 0.4 ms
Lead Channel Setting Sensing Sensitivity: 0.3 mV

## 2019-05-16 ENCOUNTER — Encounter (HOSPITAL_COMMUNITY): Payer: Medicare HMO

## 2019-05-18 ENCOUNTER — Encounter (HOSPITAL_COMMUNITY): Payer: Medicare HMO

## 2019-05-21 ENCOUNTER — Encounter (HOSPITAL_COMMUNITY): Payer: Medicare HMO

## 2019-05-22 DIAGNOSIS — Z833 Family history of diabetes mellitus: Secondary | ICD-10-CM | POA: Diagnosis not present

## 2019-05-22 DIAGNOSIS — I2510 Atherosclerotic heart disease of native coronary artery without angina pectoris: Secondary | ICD-10-CM | POA: Diagnosis not present

## 2019-05-22 DIAGNOSIS — Z8673 Personal history of transient ischemic attack (TIA), and cerebral infarction without residual deficits: Secondary | ICD-10-CM | POA: Diagnosis not present

## 2019-05-22 DIAGNOSIS — E119 Type 2 diabetes mellitus without complications: Secondary | ICD-10-CM | POA: Diagnosis not present

## 2019-05-23 ENCOUNTER — Encounter (HOSPITAL_COMMUNITY): Payer: Medicare HMO

## 2019-05-25 ENCOUNTER — Encounter (HOSPITAL_COMMUNITY): Payer: Medicare HMO

## 2019-05-25 ENCOUNTER — Encounter: Payer: Self-pay | Admitting: Cardiology

## 2019-05-25 NOTE — Progress Notes (Signed)
Remote ICD transmission.   

## 2019-05-28 ENCOUNTER — Encounter (HOSPITAL_COMMUNITY): Payer: Medicare HMO

## 2019-05-30 ENCOUNTER — Encounter (HOSPITAL_COMMUNITY): Payer: Medicare HMO

## 2019-06-01 ENCOUNTER — Encounter (HOSPITAL_COMMUNITY): Payer: Medicare HMO

## 2019-06-04 ENCOUNTER — Encounter (HOSPITAL_COMMUNITY): Payer: Medicare HMO

## 2019-06-06 ENCOUNTER — Encounter (HOSPITAL_COMMUNITY): Payer: Medicare HMO

## 2019-06-08 ENCOUNTER — Encounter (HOSPITAL_COMMUNITY): Payer: Medicare HMO

## 2019-06-11 DIAGNOSIS — I679 Cerebrovascular disease, unspecified: Secondary | ICD-10-CM | POA: Diagnosis not present

## 2019-06-11 DIAGNOSIS — N183 Chronic kidney disease, stage 3 (moderate): Secondary | ICD-10-CM | POA: Diagnosis not present

## 2019-06-11 DIAGNOSIS — Z0000 Encounter for general adult medical examination without abnormal findings: Secondary | ICD-10-CM | POA: Diagnosis not present

## 2019-06-11 DIAGNOSIS — Z1389 Encounter for screening for other disorder: Secondary | ICD-10-CM | POA: Diagnosis not present

## 2019-06-11 DIAGNOSIS — I2510 Atherosclerotic heart disease of native coronary artery without angina pectoris: Secondary | ICD-10-CM | POA: Diagnosis not present

## 2019-06-11 DIAGNOSIS — I10 Essential (primary) hypertension: Secondary | ICD-10-CM | POA: Diagnosis not present

## 2019-06-11 DIAGNOSIS — E1165 Type 2 diabetes mellitus with hyperglycemia: Secondary | ICD-10-CM | POA: Diagnosis not present

## 2019-06-11 DIAGNOSIS — Z794 Long term (current) use of insulin: Secondary | ICD-10-CM | POA: Diagnosis not present

## 2019-06-12 DIAGNOSIS — N183 Chronic kidney disease, stage 3 (moderate): Secondary | ICD-10-CM | POA: Diagnosis not present

## 2019-08-13 ENCOUNTER — Ambulatory Visit (INDEPENDENT_AMBULATORY_CARE_PROVIDER_SITE_OTHER): Payer: Medicare HMO | Admitting: *Deleted

## 2019-08-13 DIAGNOSIS — I5032 Chronic diastolic (congestive) heart failure: Secondary | ICD-10-CM

## 2019-08-13 DIAGNOSIS — I255 Ischemic cardiomyopathy: Secondary | ICD-10-CM

## 2019-08-14 LAB — CUP PACEART REMOTE DEVICE CHECK
Battery Remaining Longevity: 47 mo
Battery Voltage: 2.96 V
Brady Statistic AP VP Percent: 0.03 %
Brady Statistic AP VS Percent: 0.01 %
Brady Statistic AS VP Percent: 98.66 %
Brady Statistic AS VS Percent: 1.29 %
Brady Statistic RA Percent Paced: 0.05 %
Brady Statistic RV Percent Paced: 1.59 %
Date Time Interrogation Session: 20201005073422
HighPow Impedance: 38 Ohm
HighPow Impedance: 47 Ohm
Implantable Lead Implant Date: 20090610
Implantable Lead Implant Date: 20090610
Implantable Lead Implant Date: 20090610
Implantable Lead Location: 753858
Implantable Lead Location: 753859
Implantable Lead Location: 753860
Implantable Lead Model: 4194
Implantable Lead Model: 5076
Implantable Lead Model: 6947
Implantable Pulse Generator Implant Date: 20160323
Lead Channel Impedance Value: 247 Ohm
Lead Channel Impedance Value: 361 Ohm
Lead Channel Impedance Value: 456 Ohm
Lead Channel Impedance Value: 456 Ohm
Lead Channel Impedance Value: 532 Ohm
Lead Channel Impedance Value: 703 Ohm
Lead Channel Pacing Threshold Amplitude: 0.875 V
Lead Channel Pacing Threshold Amplitude: 0.875 V
Lead Channel Pacing Threshold Amplitude: 1.25 V
Lead Channel Pacing Threshold Pulse Width: 0.4 ms
Lead Channel Pacing Threshold Pulse Width: 0.4 ms
Lead Channel Pacing Threshold Pulse Width: 0.4 ms
Lead Channel Sensing Intrinsic Amplitude: 2.125 mV
Lead Channel Sensing Intrinsic Amplitude: 2.125 mV
Lead Channel Sensing Intrinsic Amplitude: 3.75 mV
Lead Channel Sensing Intrinsic Amplitude: 3.75 mV
Lead Channel Setting Pacing Amplitude: 1.75 V
Lead Channel Setting Pacing Amplitude: 2 V
Lead Channel Setting Pacing Amplitude: 2.25 V
Lead Channel Setting Pacing Pulse Width: 0.4 ms
Lead Channel Setting Pacing Pulse Width: 0.4 ms
Lead Channel Setting Sensing Sensitivity: 0.3 mV

## 2019-08-22 NOTE — Progress Notes (Signed)
Remote ICD transmission.   

## 2019-08-31 DIAGNOSIS — N183 Chronic kidney disease, stage 3 unspecified: Secondary | ICD-10-CM | POA: Diagnosis not present

## 2019-09-03 DIAGNOSIS — N183 Chronic kidney disease, stage 3 unspecified: Secondary | ICD-10-CM | POA: Diagnosis not present

## 2019-09-03 DIAGNOSIS — E1165 Type 2 diabetes mellitus with hyperglycemia: Secondary | ICD-10-CM | POA: Diagnosis not present

## 2019-09-03 DIAGNOSIS — I2510 Atherosclerotic heart disease of native coronary artery without angina pectoris: Secondary | ICD-10-CM | POA: Diagnosis not present

## 2019-09-03 DIAGNOSIS — I5022 Chronic systolic (congestive) heart failure: Secondary | ICD-10-CM | POA: Diagnosis not present

## 2019-09-03 DIAGNOSIS — E1142 Type 2 diabetes mellitus with diabetic polyneuropathy: Secondary | ICD-10-CM | POA: Diagnosis not present

## 2019-09-03 DIAGNOSIS — I10 Essential (primary) hypertension: Secondary | ICD-10-CM | POA: Diagnosis not present

## 2019-09-03 DIAGNOSIS — E1151 Type 2 diabetes mellitus with diabetic peripheral angiopathy without gangrene: Secondary | ICD-10-CM | POA: Diagnosis not present

## 2019-09-03 DIAGNOSIS — J4520 Mild intermittent asthma, uncomplicated: Secondary | ICD-10-CM | POA: Diagnosis not present

## 2019-09-03 DIAGNOSIS — G459 Transient cerebral ischemic attack, unspecified: Secondary | ICD-10-CM | POA: Diagnosis not present

## 2019-09-06 DIAGNOSIS — N184 Chronic kidney disease, stage 4 (severe): Secondary | ICD-10-CM | POA: Diagnosis not present

## 2019-09-06 DIAGNOSIS — I129 Hypertensive chronic kidney disease with stage 1 through stage 4 chronic kidney disease, or unspecified chronic kidney disease: Secondary | ICD-10-CM | POA: Diagnosis not present

## 2019-09-06 DIAGNOSIS — D631 Anemia in chronic kidney disease: Secondary | ICD-10-CM | POA: Diagnosis not present

## 2019-09-06 DIAGNOSIS — N2581 Secondary hyperparathyroidism of renal origin: Secondary | ICD-10-CM | POA: Diagnosis not present
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
## 2019-09-13 ENCOUNTER — Ambulatory Visit: Payer: Medicare HMO | Admitting: Physician Assistant

## 2019-09-13 ENCOUNTER — Other Ambulatory Visit: Payer: Self-pay

## 2019-09-13 VITALS — BP 138/84 | HR 80 | Ht 64.0 in | Wt 164.0 lb

## 2019-09-13 DIAGNOSIS — I10 Essential (primary) hypertension: Secondary | ICD-10-CM | POA: Diagnosis not present

## 2019-09-13 DIAGNOSIS — I255 Ischemic cardiomyopathy: Secondary | ICD-10-CM

## 2019-09-13 DIAGNOSIS — I2510 Atherosclerotic heart disease of native coronary artery without angina pectoris: Secondary | ICD-10-CM | POA: Diagnosis not present

## 2019-09-13 DIAGNOSIS — Z95810 Presence of automatic (implantable) cardiac defibrillator: Secondary | ICD-10-CM

## 2019-09-13 DIAGNOSIS — I5032 Chronic diastolic (congestive) heart failure: Secondary | ICD-10-CM

## 2019-09-13 NOTE — Patient Instructions (Signed)
Medication Instructions:  Your physician recommends that you continue on your current medications as directed. Please refer to the Current Medication list given to you today.  *If you need a refill on your cardiac medications before your next appointment, please call your pharmacy*  Lab Work: [place]   If you have labs (blood work) drawn today and your tests are completely normal, you will receive your results only by: [name] MyChart Message (if you have MyChart) OR . A paper copy in the mail If you have any lab test that is abnormal or we need to change your treatment, we will call you to review the results.  Testing/Procedures: NONE ORDERED  TODAY    Follow-Up: At [facility], you and your health needs are our priority.  As part of our continuing mission to provide you with exceptional heart care, we have created designated Provider Care Teams.  These Care Teams include your primary Cardiologist (physician) and Advanced Practice Providers (APPs -  Physician Assistants and Nurse Practitioners) who all work together to provide you with the care you need, when you need it.  Your next appointment:   12 months  The format for your next appointment:   In Person  Provider:   You may see Dr. Lovena Le  or one of the following Advanced Practice Providers on your designated Care Team:    Chanetta Marshall, NP  Tommye Standard, PA-C  Legrand Como "Oda Kilts, [place]   Other Instructions

## 2019-09-17 DIAGNOSIS — F411 Generalized anxiety disorder: Secondary | ICD-10-CM | POA: Diagnosis not present

## 2019-09-17 DIAGNOSIS — F332 Major depressive disorder, recurrent severe without psychotic features: Secondary | ICD-10-CM | POA: Diagnosis not present

## 2019-09-17 DIAGNOSIS — Z636 Dependent relative needing care at home: Secondary | ICD-10-CM | POA: Diagnosis not present

## 2019-09-17 DIAGNOSIS — Z630 Problems in relationship with spouse or partner: Secondary | ICD-10-CM | POA: Diagnosis not present

## 2019-09-19 ENCOUNTER — Encounter: Payer: Self-pay | Admitting: Cardiology

## 2019-09-19 ENCOUNTER — Other Ambulatory Visit: Payer: Self-pay

## 2019-09-19 ENCOUNTER — Ambulatory Visit: Payer: Medicare HMO | Admitting: Cardiology

## 2019-09-19 VITALS — BP 126/80 | HR 81 | Ht 64.0 in | Wt 160.8 lb

## 2019-09-19 DIAGNOSIS — E782 Mixed hyperlipidemia: Secondary | ICD-10-CM | POA: Diagnosis not present

## 2019-09-19 DIAGNOSIS — E119 Type 2 diabetes mellitus without complications: Secondary | ICD-10-CM

## 2019-09-19 DIAGNOSIS — I2510 Atherosclerotic heart disease of native coronary artery without angina pectoris: Secondary | ICD-10-CM

## 2019-09-19 DIAGNOSIS — I10 Essential (primary) hypertension: Secondary | ICD-10-CM

## 2019-09-19 DIAGNOSIS — I5022 Chronic systolic (congestive) heart failure: Secondary | ICD-10-CM

## 2019-09-19 DIAGNOSIS — I341 Nonrheumatic mitral (valve) prolapse: Secondary | ICD-10-CM

## 2019-09-19 DIAGNOSIS — Z794 Long term (current) use of insulin: Secondary | ICD-10-CM

## 2019-09-19 DIAGNOSIS — Z95810 Presence of automatic (implantable) cardiac defibrillator: Secondary | ICD-10-CM | POA: Diagnosis not present

## 2019-09-19 NOTE — Patient Instructions (Signed)

## 2019-09-19 NOTE — Progress Notes (Signed)
Cardiology Office Note   Date:  09/21/2019   ID:  [name], [name] 07/17/1951, MRN [id number]  PCP:  [name], MD  Cardiologist:  Dr. Tamala Julian     Chief Complaint  Patient presents with  . Coronary Artery Disease      History of Present Illness: [name] is a 68 y.o. female who presents for CAD.    She has a hx of CAD s/pCABG 1998, ischemic cardiomyopathy s/p BiV DefibrillatorMedtronic (followed by Dr. Lovena Le), mitral valve disease with moderate regurgitation, DM2, CKD and ischemic stroke seen for follow up    She had a cardiopulmonaryexcercisestudy in 2017 with no ventilatory limitation with significant chronotropic incompetence. Echocardiogram in 12/2015 with LVEF up to 45-50% with G2DD, moderate MR and mild TR. Last cath 08/04/2018 with chronicseverecoronary disease, unchanged from prior angiogram with multi-vessel native diseaseandpatent LIMA to LAD, patent SVG to Dig and patent SVG to PDA.    She was doing well when last seen by Dr. Tamala Julian 09/2018.   On her EP appt her device was stable and she had complained of mild chest pain thought to be muscular.  She has been under increased stress due to recent life altering illness of her husband. She is caring for him at home.  She is up and down stairs and no angina.  Her wt is down 15 lbs..she attributes to change in diabetes meds.  No SOB if she takes her time.  She is seeing her counselor to help take care of herself and her daughter is also emotional support.    Past Medical History:  Diagnosis Date  . AICD (automatic cardioverter/defibrillator) present    Medtronic- Dr. Beckie Salts follows  . Anginal pain ([place])   . Anxiety   . Asthma   . Asthma   . Back pain    "pinched nerve-lower back" - Dr. Nelva Bush follows.  . Biventricular implantable cardioverter-defibrillator in situ 06/18/2011  . Cerebral infarction ([place]) 11/11/2012  . Cervical dysplasia   . CHF (congestive heart failure) ([place])   .  Chronic diastolic heart failure ([place]) 03/22/2016  . Chronic kidney disease    Dr. Posey Pronto follows.  . Chronic renal insufficiency, stage III (moderate) 11/11/2012  . Complication of anesthesia    "I wake up during surgeries" (02/14/2013)  . Coronary artery disease   . Coronary artery disease involving coronary bypass graft of native heart with angina pectoris ([place]) 06/18/2011  . Depression   . DM (diabetes mellitus) ([place]) 11/11/2012  . Fibroid   . Function kidney decreased   . GERD (gastroesophageal reflux disease)   . Hemiplegia, unspecified, affecting nondominant side 11/11/2012  . Hepatitis    Hepatitis A -college yrs"water source exposure"  . Hiatal hernia   . History of shingles    2-3 yrs ago last out break "around waist"  . History of stomach ulcers   . HTN (hypertension) 11/11/2012  . Hyperlipidemia 07/30/2016  . Hypertension   . ICD (implantable cardiac defibrillator) in place   . Iron deficiency anemia   . Ischemic cardiomyopathy    status post biventricular ICD placed by DR Edumunds who used to see Dr Melvern Banker here to establish  cardiovascular care.  . Migraines   . MVP (mitral valve prolapse)    Antibiotics not required for procedures  . Myocardial infarction ([place])    "I've had 2; the others they were able to catch before completing" (02/14/2013)  . Pacemaker   . Paroxysmal SVT (supraventricular tachycardia) ([place]) 06/18/2011  .  Pneumonia 1950's & 1985  . Shortness of breath    "lying down flat; at times w/exertion" (02/14/2013). 10-06-15 exertion only..  . Stroke [facility])    "2 confirmed; 9 TIA's; results in dragging LLE; numbness in tip of tongue" (02/14/2013),10-06-15 right hand tends to be weaker when tired.  [name] TIA (transient ischemic attack) 11/11/2012  . Type II diabetes mellitus ([place])     Past Surgical History:  Procedure Laterality Date  . ABDOMINAL HYSTERECTOMY  1985   TAH   . BIV ICD GENERTAOR CHANGE OUT  06/2007; 04/2008   "2 lead initial placement, at [facility]; done at [facility], after  developing CHF" (02/14/2013)  . BIV PACEMAKER GENERATOR CHANGE OUT N/A 01/29/2015   Procedure: BIV PACEMAKER GENERATOR CHANGE OUT;  Surgeon: Evans Lance, MD;  Location: [facility] CATH LAB;  Service: Cardiovascular;  Laterality: N/A;  . BREAST EXCISIONAL BIOPSY Left 01/2007; 06/2007; 03/2008   "benign" (02/14/2013)  . BREAST SURGERY    . CARDIAC CATHETERIZATION     "probably in the teens" (02/14/2013)  . CARDIAC DEFIBRILLATOR PLACEMENT    . COLONOSCOPY  ~ 2002  . COLONOSCOPY WITH PROPOFOL N/A 10/07/2015   Procedure: COLONOSCOPY WITH PROPOFOL;  Surgeon: Juanita Craver, MD;  Location: [facility] ENDOSCOPY;  Service: Endoscopy;  Laterality: N/A;  . CORONARY ANGIOPLASTY WITH STENT PLACEMENT     "started out w/5; bypass corrected some; 1 stent since the bypass" (02/14/2013)  . CORONARY ARTERY BYPASS GRAFT  ` 1998   LIMA-LAD, SVG-D1, SVG-PDA  . [place] OF UTERUS  1975 X 2; 1976; 1977  . INSERT / REPLACE / REMOVE PACEMAKER     biventricular defibrillator--06/10/ 2009  . LEFT HEART CATH AND CORS/GRAFTS ANGIOGRAPHY N/A 08/04/2018   Procedure: LEFT HEART CATH AND CORS/GRAFTS ANGIOGRAPHY;  Surgeon: Burnell Blanks, MD;  Location: [facility] CV LAB;  Service: Cardiovascular;  Laterality: N/A;  . LEFT HEART CATHETERIZATION WITH CORONARY/GRAFT ANGIOGRAM N/A 01/03/2015   Procedure: LEFT HEART CATHETERIZATION WITH Beatrix Fetters;  Surgeon: Sinclair Grooms, MD;  Location: [facility] CATH LAB;  Service: Cardiovascular;  Laterality: N/A;  . SUPRAVENTRICULAR TACHYCARDIA ABLATION  06/2007  . TEE WITHOUT CARDIOVERSION  11/14/2012   Procedure: TRANSESOPHAGEAL ECHOCARDIOGRAM (TEE);  Surgeon: Candee Furbish, MD;  Location: [facility] ENDOSCOPY;  Service: Cardiovascular;  Laterality: N/A;     Current Outpatient Medications  Medication Sig Dispense Refill  . albuterol (PROVENTIL HFA;VENTOLIN HFA) 108 (90 BASE) MCG/ACT inhaler Inhale 2 puffs into the lungs every 4 (four) hours as needed for wheezing or shortness of breath.    .  Alcohol Swabs (B-D SINGLE USE SWABS REGULAR) PADS     . ALPRAZolam (XANAX) 1 MG tablet TAKE 1 TABLET BY MOUTH EVERY MORNING, 1 TABLET AT LUNCH, AND 1/2 TAB AT BEDTIME  2  . Artificial Tear GEL Place 2 drops into both eyes daily as needed (dry eyes).     [name] aspirin 325 MG tablet Take 325 mg by mouth daily.    [name] BIOTIN PO Take by mouth daily.    . cetirizine (ZYRTEC) 10 MG tablet Take 10 mg by mouth daily.    . Cholecalciferol (VITAMIN D-3) 1000 UNITS CAPS Take 1 capsule by mouth daily.     . clopidogrel (PLAVIX) 75 MG tablet TAKE 1 TABLET EVERY DAY 90 tablet 3  . cyclobenzaprine (FLEXERIL) 5 MG tablet Take 1 tablet (5 mg total) by mouth at bedtime as needed for muscle spasms. 30 tablet 0  . docusate sodium (COLACE) 250 MG capsule Take 250  mg by mouth daily.    [name] doxazosin (CARDURA) 4 MG tablet Take 4 mg by mouth at bedtime.     . DROPLET PEN NEEDLES 32G X 4 MM MISC     . EPINEPHrine 0.3 mg/0.3 mL IJ SOAJ injection AS DIRECTED USE THIS IF EXPOSED TO ALLERGEN AND REACTION INJECTION 30 DAYS  3  . ezetimibe (ZETIA) 10 MG tablet TAKE 1 TABLET EVERY DAY 90 tablet 3  . fluticasone (FLONASE) 50 MCG/ACT nasal spray Place 2 sprays into both nostrils daily as needed for allergies or rhinitis.     . furosemide (LASIX) 80 MG tablet TAKE 1 TABLET TWICE A DAY. MAY TAKE AN ADDITIONAL 80MG  (1 TABLET) AS NEEDED FOR EDEMA 270 tablet 3  . hydrALAZINE (APRESOLINE) 25 MG tablet Take 1 tablet (25 mg total) by mouth 2 (two) times a day. 180 tablet 3  . isosorbide mononitrate (IMDUR) 60 MG 24 hr tablet Take 1 tablet (60 mg total) by mouth daily. 90 tablet 3  . Ketotifen Fumarate (ALAWAY OP) Apply to eye.    [name] LANTUS SOLOSTAR 100 UNIT/ML Solostar Pen Inject 10 Units into the skin daily.    . metoprolol succinate (TOPROL-XL) 100 MG 24 hr tablet TAKE 2 TABLETS DAILY. TAKE WITH OR IMMEDIATELY FOLLOWING A MEAL 180 tablet 3  . Multiple Vitamin (MULTIVITAMIN WITH MINERALS) TABS Take 1 tablet by mouth daily.    . nitroGLYCERIN  (NITROSTAT) 0.4 MG SL tablet Place 1 tablet under the tongue every 5 minutes as needed for chest pain, max 3 doses, go to er if no relief 25 tablet 2  . olmesartan (BENICAR) 40 MG tablet Take 1 tablet (40 mg total) by mouth daily. 90 tablet 1  . ondansetron (ZOFRAN) 8 MG tablet Take 8 mg by mouth every 8 (eight) hours as needed for nausea or vomiting.     [name] OZEMPIC, 0.25 OR 0.5 MG/DOSE, 2 MG/1.5ML SOPN Inject 0.25 mg into the skin daily.    . pantoprazole (PROTONIX) 40 MG tablet Take 40 mg by mouth daily.    . potassium chloride (K-DUR,KLOR-CON) 10 MEQ tablet TAKE 2 TABLETS TWICE DAILY 360 tablet 3  . simvastatin (ZOCOR) 20 MG tablet TAKE 2 TABLETS (40 MG TOTAL) BY MOUTH AT BEDTIME. 180 tablet 3  . sodium chloride (MURO 128) 5 % ophthalmic ointment Place 1 drop into both eyes at bedtime. For dry eyes    . traMADol (ULTRAM) 50 MG tablet Take 100 mg by mouth every 6 (six) hours as needed (MIGRAINES).    . TRUE METRIX BLOOD GLUCOSE TEST test strip     . TRUEplus Lancets 33G MISC     . venlafaxine (EFFEXOR) 100 MG tablet See admin instructions. TAKE 200 mg by mouth in the morning and take 100 mg at non    . vitamin A 7500 UNIT capsule Take 2,400 Units by mouth daily.     No current facility-administered medications for this visit.     Allergies:   Calcium channel blockers, Codeine, Lyrica [pregabalin], Other, Ramipril, Ticlid [ticlopidine hcl], Morphine and related, Tape, Digoxin and related, Metolazone, Myrbetriq [mirabegron], Onglyza [saxagliptin], Penicillins, Spironolactone, Sulfa antibiotics, Tetracyclines & related, Vicodin [hydrocodone-acetaminophen], and Latex    Social History:  The patient  reports that she has never smoked. She has never used smokeless tobacco. She reports that she does not drink alcohol or use drugs.   Family History:  The patient's family history includes Diabetes in her brother, father, and paternal grandmother; Heart attack in her brother, father,  and mother; Heart  disease in her brother, father, and mother; Hypertension in her father and mother; Kidney failure in her brother and father; Stroke in her father and mother.    ROS:  General:no colds or fevers, no weight changes Skin:no rashes or ulcers HEENT:no blurred vision, no congestion CV:see HPI PUL:see HPI GI:no diarrhea constipation or melena, no indigestion GU:no hematuria, no dysuria MS:no joint pain, no claudication Neuro:no syncope, no lightheadedness Endo:+ diabetes on insulin, no thyroid disease  Wt Readings from Last 3 Encounters:  09/19/19 160 lb 12.8 oz (72.9 kg)  09/13/19 164 lb (74.4 kg)  03/19/19 175 lb (79.4 kg)     PHYSICAL EXAM: VS:  BP 126/80   Pulse 81   Ht 5\' 4"  (1.626 m)   Wt 160 lb 12.8 oz (72.9 kg)   SpO2 98%   BMI 27.60 kg/m  , BMI Body mass index is 27.6 kg/m. General:Pleasant affect, NAD Skin:Warm and dry, brisk capillary refill HEENT:normocephalic, sclera clear, mucus membranes moist Neck:supple, no JVD, no bruits  Heart:S1S2 RRR without murmur, gallup, rub or click Lungs:clear without rales, rhonchi, or wheezes ZOX:WRUE, non tender, + BS, do not palpate liver spleen or masses Ext:no lower ext edema, 2+ pedal pulses, 2+ radial pulses Neuro:alert and oriented X 3, MAE, follows commands, + facial symmetry    EKG:  EKG is NOT ordered today.  Recent Labs: 04/06/2019: ALT 20; Hemoglobin 11.4; Platelets 215    Lipid Panel    Component Value Date/Time   CHOL 179 04/06/2019 1510   TRIG 129 04/06/2019 1510   HDL 61 04/06/2019 1510   CHOLHDL 2.9 04/06/2019 1510   CHOLHDL 3.2 01/02/2016 0730   VLDL 26 01/02/2016 0730   LDLCALC 92 04/06/2019 1510       Other studies Reviewed: Additional studies/ records that were reviewed today include: . LEFT HEART CATH AND CORS/GRAFTS ANGIOGRAPHY 08/04/2018  Conclusion     Prox RCA lesion is 100% stenosed.  SVG graft was visualized by angiography and is normal in caliber.  Ost 1st Diag lesion is 100%  stenosed.  SVG graft was visualized by angiography and is normal in caliber.  Prox LAD to Mid LAD lesion is 90% stenosed.  Mid LM to Dist LM lesion is 50% stenosed.  Mid Cx to Dist Cx lesion is 60% stenosed.  Ost 2nd Mrg lesion is 50% stenosed.  1. Severe native vessel CAD s/p 3V CABG with 3/3 patent grafts 2. Mild distal left main stenosis 3. Severe mid LAD stenosis. Antegrade flow and retrograde flow in the mid and distal LAD. The LIMA to the mid LAD is patent. The Diagonal is occluded. The SVG to the Diagonal is patent.  4. The Circumflex is a large caliber artery with mild plaque in the mid segment. The distal AV groove Circumflex has chronic diffuse stenosis 5. The RCA is chronically occluded in the proximal segment. The SVG to the PDA is patent.   Recommendations: Continue medical management of CAD   Echo 12/2015 Study Conclusions  - Left ventricle: The cavity size was normal. Systolic function was mildly reduced. The estimated ejection fraction was in the range of 45% to 50%. Diffuse hypokinesis. Features are consistent with a pseudonormal left ventricular filling pattern, with concomitant abnormal relaxation and increased filling pressure (grade 2 diastolic dysfunction). - Mitral valve: There was moderate regurgitation. - Left atrium: The atrium was moderately dilated. - Right ventricle: Poorly visualized. - Tricuspid valve: There was mild regurgitation.    ASSESSMENT AND PLAN:  1.  CAD with hx of CABG and last cath 2019 with patent grafts and no angina 2.  Chronic systolic HF -euvolemic today and overall 15 lb wt loss. Hx of  3.  HTN controlled continue meds 4.  HLD continue meds 5. BiV ICD stable followed by Dr. Lovena Le recently seen 6.  DM-2 with insulin and stage 3 CKD followed by PCP  7.  Hx of moderate MR -asymptomatic currently Follow up in 6 months.     Current medicines are reviewed with the patient today.  The patient Has no concerns  regarding medicines.  The following changes have been made:  See above Labs/ tests ordered today include:see above  Disposition:   FU:  see above  Signed, Cecilie Kicks, NP  09/21/2019 8:18 PM    [address], [place], [place], [place] Phone: [phone]; Fax: [phone]

## 2019-09-22 ENCOUNTER — Other Ambulatory Visit: Payer: Self-pay | Admitting: Interventional Cardiology

## 2019-09-25 DIAGNOSIS — Z1231 Encounter for screening mammogram for malignant neoplasm of breast: Secondary | ICD-10-CM | POA: Diagnosis not present

## 2019-09-29 ENCOUNTER — Other Ambulatory Visit: Payer: Self-pay | Admitting: Interventional Cardiology

## 2019-10-09 DIAGNOSIS — Z8673 Personal history of transient ischemic attack (TIA), and cerebral infarction without residual deficits: Secondary | ICD-10-CM | POA: Diagnosis not present

## 2019-10-09 DIAGNOSIS — E119 Type 2 diabetes mellitus without complications: Secondary | ICD-10-CM | POA: Diagnosis not present

## 2019-10-09 DIAGNOSIS — Z833 Family history of diabetes mellitus: Secondary | ICD-10-CM | POA: Diagnosis not present

## 2019-10-09 DIAGNOSIS — I2510 Atherosclerotic heart disease of native coronary artery without angina pectoris: Secondary | ICD-10-CM | POA: Diagnosis not present

## 2019-10-22 DIAGNOSIS — E119 Type 2 diabetes mellitus without complications: Secondary | ICD-10-CM | POA: Diagnosis not present

## 2019-10-22 DIAGNOSIS — H04123 Dry eye syndrome of bilateral lacrimal glands: Secondary | ICD-10-CM | POA: Diagnosis not present

## 2019-10-22 DIAGNOSIS — H25813 Combined forms of age-related cataract, bilateral: Secondary | ICD-10-CM | POA: Diagnosis not present

## 2019-11-12 ENCOUNTER — Ambulatory Visit (INDEPENDENT_AMBULATORY_CARE_PROVIDER_SITE_OTHER): Payer: Medicare Other | Admitting: *Deleted

## 2019-11-12 DIAGNOSIS — Z95810 Presence of automatic (implantable) cardiac defibrillator: Secondary | ICD-10-CM

## 2019-11-13 LAB — CUP PACEART REMOTE DEVICE CHECK
Battery Remaining Longevity: 38 mo
Battery Voltage: 2.96 V
Brady Statistic AP VP Percent: 0.03 %
Brady Statistic AP VS Percent: 0.01 %
Brady Statistic AS VP Percent: 98.52 %
Brady Statistic AS VS Percent: 1.43 %
Brady Statistic RA Percent Paced: 0.05 %
Brady Statistic RV Percent Paced: 1.34 %
Date Time Interrogation Session: 20210104033624
HighPow Impedance: 38 Ohm
HighPow Impedance: 47 Ohm
Implantable Lead Implant Date: 20090610
Implantable Lead Implant Date: 20090610
Implantable Lead Implant Date: 20090610
Implantable Lead Location: 753858
Implantable Lead Location: 753859
Implantable Lead Location: 753860
Implantable Lead Model: 4194
Implantable Lead Model: 5076
Implantable Lead Model: 6947
Implantable Pulse Generator Implant Date: 20160323
Lead Channel Impedance Value: 247 Ohm
Lead Channel Impedance Value: 399 Ohm
Lead Channel Impedance Value: 456 Ohm
Lead Channel Impedance Value: 456 Ohm
Lead Channel Impedance Value: 513 Ohm
Lead Channel Impedance Value: 665 Ohm
Lead Channel Pacing Threshold Amplitude: 0.875 V
Lead Channel Pacing Threshold Amplitude: 0.875 V
Lead Channel Pacing Threshold Amplitude: 1.625 V
Lead Channel Pacing Threshold Pulse Width: 0.4 ms
Lead Channel Pacing Threshold Pulse Width: 0.4 ms
Lead Channel Pacing Threshold Pulse Width: 0.4 ms
Lead Channel Sensing Intrinsic Amplitude: 2.125 mV
Lead Channel Sensing Intrinsic Amplitude: 2.125 mV
Lead Channel Sensing Intrinsic Amplitude: 3.125 mV
Lead Channel Sensing Intrinsic Amplitude: 3.125 mV
Lead Channel Setting Pacing Amplitude: 1.75 V
Lead Channel Setting Pacing Amplitude: 2 V
Lead Channel Setting Pacing Amplitude: 2.75 V
Lead Channel Setting Pacing Pulse Width: 0.4 ms
Lead Channel Setting Pacing Pulse Width: 0.4 ms
Lead Channel Setting Sensing Sensitivity: 0.3 mV

## 2019-11-19 ENCOUNTER — Other Ambulatory Visit: Payer: Self-pay | Admitting: Interventional Cardiology

## 2019-11-19 MED ORDER — POTASSIUM CHLORIDE CRYS ER 10 MEQ PO TBCR: EXTENDED_RELEASE_TABLET | ORAL | 3 refills | Status: DC

## 2019-12-30 ENCOUNTER — Other Ambulatory Visit: Payer: Self-pay | Admitting: Interventional Cardiology

## 2020-01-03 ENCOUNTER — Ambulatory Visit: Payer: Medicare Other | Attending: Internal Medicine

## 2020-01-03 DIAGNOSIS — Z23 Encounter for immunization: Secondary | ICD-10-CM

## 2020-01-03 NOTE — Progress Notes (Signed)
   Covid-19 Vaccination Clinic  Name:  [name]    MRN: [id number] DOB: 12/11/1950  01/03/2020  [name] was observed post Covid-19 immunization for 15 minutes without incidence. She was provided with Vaccine Information Sheet and instruction to access the V-Safe system.   [name] was instructed to call 911 with any severe reactions post vaccine: [name] Difficulty breathing  . Swelling of your face and throat  . A fast heartbeat  . A bad rash all over your body  . Dizziness and weakness    Immunizations Administered    Name Date Dose VIS Date Route   Pfizer COVID-19 Vaccine 01/03/2020  4:24 PM 0.3 mL 10/19/2019 Intramuscular   Manufacturer: [place]   Lot: [id number]   Kay: [id number]

## 2020-01-09 ENCOUNTER — Other Ambulatory Visit: Payer: Self-pay | Admitting: Medical

## 2020-01-09 ENCOUNTER — Emergency Department (HOSPITAL_COMMUNITY)
Admission: EM | Admit: 2020-01-09 | Discharge: 2020-01-09 | Disposition: A | Discharge status: Home / Self Care | Payer: Medicare Other | Attending: Emergency Medicine | Admitting: Emergency Medicine

## 2020-01-09 ENCOUNTER — Other Ambulatory Visit: Payer: Self-pay

## 2020-01-09 ENCOUNTER — Emergency Department (HOSPITAL_COMMUNITY): Payer: Medicare Other

## 2020-01-09 ENCOUNTER — Encounter (HOSPITAL_COMMUNITY): Payer: Self-pay

## 2020-01-09 DIAGNOSIS — Z79899 Other long term (current) drug therapy: Secondary | ICD-10-CM | POA: Insufficient documentation

## 2020-01-09 DIAGNOSIS — Z95811 Presence of heart assist device: Secondary | ICD-10-CM | POA: Diagnosis not present

## 2020-01-09 DIAGNOSIS — I130 Hypertensive heart and chronic kidney disease with heart failure and stage 1 through stage 4 chronic kidney disease, or unspecified chronic kidney disease: Secondary | ICD-10-CM | POA: Insufficient documentation

## 2020-01-09 DIAGNOSIS — I2510 Atherosclerotic heart disease of native coronary artery without angina pectoris: Secondary | ICD-10-CM | POA: Diagnosis not present

## 2020-01-09 DIAGNOSIS — I5032 Chronic diastolic (congestive) heart failure: Secondary | ICD-10-CM | POA: Diagnosis not present

## 2020-01-09 DIAGNOSIS — I252 Old myocardial infarction: Secondary | ICD-10-CM | POA: Diagnosis not present

## 2020-01-09 DIAGNOSIS — R0789 Other chest pain: Secondary | ICD-10-CM | POA: Diagnosis not present

## 2020-01-09 DIAGNOSIS — Z950 Presence of cardiac pacemaker: Secondary | ICD-10-CM | POA: Insufficient documentation

## 2020-01-09 DIAGNOSIS — Z91040 Latex allergy status: Secondary | ICD-10-CM | POA: Diagnosis not present

## 2020-01-09 DIAGNOSIS — R079 Chest pain, unspecified: Secondary | ICD-10-CM | POA: Diagnosis not present

## 2020-01-09 DIAGNOSIS — E1122 Type 2 diabetes mellitus with diabetic chronic kidney disease: Secondary | ICD-10-CM | POA: Diagnosis not present

## 2020-01-09 DIAGNOSIS — N183 Chronic kidney disease, stage 3 unspecified: Secondary | ICD-10-CM | POA: Diagnosis not present

## 2020-01-09 DIAGNOSIS — Z7901 Long term (current) use of anticoagulants: Secondary | ICD-10-CM | POA: Diagnosis not present

## 2020-01-09 DIAGNOSIS — N189 Chronic kidney disease, unspecified: Secondary | ICD-10-CM

## 2020-01-09 DIAGNOSIS — R1013 Epigastric pain: Secondary | ICD-10-CM | POA: Insufficient documentation

## 2020-01-09 DIAGNOSIS — Z794 Long term (current) use of insulin: Secondary | ICD-10-CM | POA: Diagnosis not present

## 2020-01-09 LAB — HEPATIC FUNCTION PANEL
ALT: 26 U/L (ref 0–44)
AST: 32 U/L (ref 15–41)
Albumin: 3.8 g/dL (ref 3.5–5.0)
Alkaline Phosphatase: 44 U/L (ref 38–126)
Bilirubin, Direct: <0.1 mg/dL (ref 0.0–0.2)
Total Bilirubin: 0.7 mg/dL (ref 0.3–1.2)
Total Protein: 6.8 g/dL (ref 6.5–8.1)

## 2020-01-09 LAB — PROTIME-INR
INR: 0.9 (ref 0.8–1.2)
Prothrombin Time: 12 seconds (ref 11.4–15.2)

## 2020-01-09 LAB — BASIC METABOLIC PANEL
Anion gap: 11 (ref 5–15)
BUN: 25 mg/dL — ABNORMAL HIGH (ref 8–23)
CO2: 28 mmol/L (ref 22–32)
Calcium: 9 mg/dL (ref 8.9–10.3)
Chloride: 100 mmol/L (ref 98–111)
Creatinine, Ser: 2.64 mg/dL — ABNORMAL HIGH (ref 0.44–1.00)
GFR calc Af Amer: 21 mL/min — ABNORMAL LOW (ref >60)
GFR calc non Af Amer: 18 mL/min — ABNORMAL LOW (ref >60)
Glucose, Bld: 75 mg/dL (ref 70–99)
Potassium: 4 mmol/L (ref 3.5–5.1)
Sodium: 139 mmol/L (ref 135–145)

## 2020-01-09 LAB — CBC
HCT: 31.1 % — ABNORMAL LOW (ref 36.0–46.0)
Hemoglobin: 10.5 g/dL — ABNORMAL LOW (ref 12.0–15.0)
MCH: 29.9 pg (ref 26.0–34.0)
MCHC: 33.8 g/dL (ref 30.0–36.0)
MCV: 88.6 fL (ref 80.0–100.0)
Platelets: 104 10*3/uL — ABNORMAL LOW (ref 150–400)
RBC: 3.51 MIL/uL — ABNORMAL LOW (ref 3.87–5.11)
RDW: 12.4 % (ref 11.5–15.5)
WBC: 6.3 10*3/uL (ref 4.0–10.5)
nRBC: 0 % (ref 0.0–0.2)

## 2020-01-09 LAB — TROPONIN I (HIGH SENSITIVITY)
Troponin I (High Sensitivity): 10 ng/L (ref <18)
Troponin I (High Sensitivity): 10 ng/L (ref <18)

## 2020-01-09 LAB — LIPASE, BLOOD: Lipase: 37 U/L (ref 11–51)

## 2020-01-09 MED ORDER — SODIUM CHLORIDE 0.9% FLUSH: 3.0000 mL | Freq: Once | INTRAVENOUS | Status: DC

## 2020-01-09 NOTE — ED Provider Notes (Signed)
[facility] EMERGENCY DEPARTMENT Provider Note   CSN: [id number] Arrival date & time: 01/09/20  1125     History Chief Complaint  Patient presents with  . Chest Pain  . Abdominal Pain    [name] is a 69 y.o. female with history of CAD s/p CABG, ischemic CM s/p defibrillator, mitral regurg, insulin dependent DM, CKD.  Patient states that around 6 AM she drank Glucerna so she could take her morning meds.  Approximately 1 hour later she started to have epigastric abdominal pain which she described as a soreness.  The pain worsened and then felt like she was "punched" and it radiated to her left shoulder.  She reports associated shortness of breath, nausea and vomiting.  She called EMS and was advised to take an additional aspirin and nitroglycerin.  He states pain improved after that and went from 10 out of 10 to 5 out of 10.  She states that she is always had different symptoms with her MIs in the past and so is unsure if this could represent a cardiac etiology.  She does have GERD and hiatal hernia and takes omeprazole for this.  Currently symptoms are mostly resolved except a residual soreness in the epigastric area and fatigue.  She denies recent fever, chills, cough, diarrhea or urinary symptoms, or leg swelling.  She states that she felt totally fine this morning and has been well the past couple of days.  Cardiologist: Dr. Tamala Julian  HPI     Past Medical History:  Diagnosis Date  . AICD (automatic cardioverter/defibrillator) present    [employer]- Dr. Beckie Salts follows  . Anginal pain ([place])   . Anxiety   . Asthma   . Asthma   . Back pain    "pinched nerve-lower back" - Dr. Nelva Bush follows.  . Biventricular implantable cardioverter-defibrillator in situ 06/18/2011  . Cerebral infarction ([place]) 11/11/2012  . Cervical dysplasia   . CHF (congestive heart failure) ([place])   . Chronic diastolic heart failure ([place]) 03/22/2016  . Chronic kidney disease    Dr. Posey Pronto  follows.  . Chronic renal insufficiency, stage III (moderate) 11/11/2012  . Complication of anesthesia    "I wake up during surgeries" (02/14/2013)  . Coronary artery disease   . Coronary artery disease involving coronary bypass graft of native heart with angina pectoris ([place]) 06/18/2011  . Depression   . DM (diabetes mellitus) ([place]) 11/11/2012  . Fibroid   . Function kidney decreased   . GERD (gastroesophageal reflux disease)   . Hemiplegia, unspecified, affecting nondominant side 11/11/2012  . Hepatitis    Hepatitis A -college yrs"water source exposure"  . Hiatal hernia   . History of shingles    2-3 yrs ago last out break "around waist"  . History of stomach ulcers   . HTN (hypertension) 11/11/2012  . Hyperlipidemia 07/30/2016  . Hypertension   . ICD (implantable cardiac defibrillator) in place   . Iron deficiency anemia   . Ischemic cardiomyopathy    status post biventricular ICD placed by DR Edumunds who used to see Dr Melvern Banker here to establish  cardiovascular care.  . Migraines   . MVP (mitral valve prolapse)    Antibiotics not required for procedures  . Myocardial infarction ([place])    "I've had 2; the others they were able to catch before completing" (02/14/2013)  . Pacemaker   . Paroxysmal SVT (supraventricular tachycardia) ([place]) 06/18/2011  . Pneumonia 1950's & 1985  . Shortness of breath    "  lying down flat; at times w/exertion" (02/14/2013). 10-06-15 exertion only..  . Stroke [facility])    "2 confirmed; 9 TIA's; results in dragging LLE; numbness in tip of tongue" (02/14/2013),10-06-15 right hand tends to be weaker when tired.  [name] TIA (transient ischemic attack) 11/11/2012  . Type II diabetes mellitus [facility])     Patient Active Problem List   Diagnosis Date Noted  . Chest pain 08/02/2018  . Angina of effort ([place])   . Hyperlipidemia 07/30/2016  . Chronic diastolic heart failure ([place]) 03/22/2016  . Ischemic cardiomyopathy 01/28/2015  . Mastodynia, female 05/14/2014  . Cerebral infarction  ([place]) 11/11/2012  . HTN (hypertension) 11/11/2012  . DM (diabetes mellitus) ([place]) 11/11/2012  . Chronic renal insufficiency, stage III (moderate) ([place]) 11/11/2012  . TIA (transient ischemic attack) 11/11/2012  . History of shingles   . MVP (mitral valve prolapse)   . Asthma   . Anxiety   . Depression   . Hiatal hernia   . Cervical dysplasia   . Fibroid   . Biventricular implantable cardioverter-defibrillator in situ 06/18/2011  . Paroxysmal SVT (supraventricular tachycardia) ([place]) 06/18/2011  . Coronary artery disease involving coronary bypass graft of native heart with angina pectoris ([place]) 06/18/2011    Past Surgical History:  Procedure Laterality Date  . ABDOMINAL HYSTERECTOMY  1985   TAH   . BIV ICD GENERTAOR CHANGE OUT  06/2007; 04/2008   "2 lead initial placement, at [facility]; done at [facility], after developing CHF" (02/14/2013)  . BIV PACEMAKER GENERATOR CHANGE OUT N/A 01/29/2015   Procedure: BIV PACEMAKER GENERATOR CHANGE OUT;  Surgeon: Evans Lance, MD;  Location: [facility] CATH LAB;  Service: Cardiovascular;  Laterality: N/A;  . BREAST EXCISIONAL BIOPSY Left 01/2007; 06/2007; 03/2008   "benign" (02/14/2013)  . BREAST SURGERY    . CARDIAC CATHETERIZATION     "probably in the teens" (02/14/2013)  . CARDIAC DEFIBRILLATOR PLACEMENT    . COLONOSCOPY  ~ 2002  . COLONOSCOPY WITH PROPOFOL N/A 10/07/2015   Procedure: COLONOSCOPY WITH PROPOFOL;  Surgeon: Juanita Craver, MD;  Location: [facility] ENDOSCOPY;  Service: Endoscopy;  Laterality: N/A;  . CORONARY ANGIOPLASTY WITH STENT PLACEMENT     "started out w/5; bypass corrected some; 1 stent since the bypass" (02/14/2013)  . CORONARY ARTERY BYPASS GRAFT  ` 1998   LIMA-LAD, SVG-D1, SVG-PDA  . [place] OF UTERUS  1975 X 2; 1976; 1977  . INSERT / REPLACE / REMOVE PACEMAKER     biventricular defibrillator--06/10/ 2009  . LEFT HEART CATH AND CORS/GRAFTS ANGIOGRAPHY N/A 08/04/2018   Procedure: LEFT HEART CATH AND CORS/GRAFTS ANGIOGRAPHY;  Surgeon: Burnell Blanks, MD;  Location: [facility] CV LAB;  Service: Cardiovascular;  Laterality: N/A;  . LEFT HEART CATHETERIZATION WITH CORONARY/GRAFT ANGIOGRAM N/A 01/03/2015   Procedure: LEFT HEART CATHETERIZATION WITH Beatrix Fetters;  Surgeon: Sinclair Grooms, MD;  Location: [facility] CATH LAB;  Service: Cardiovascular;  Laterality: N/A;  . SUPRAVENTRICULAR TACHYCARDIA ABLATION  06/2007  . TEE WITHOUT CARDIOVERSION  11/14/2012   Procedure: TRANSESOPHAGEAL ECHOCARDIOGRAM (TEE);  Surgeon: Candee Furbish, MD;  Location: [facility] ENDOSCOPY;  Service: Cardiovascular;  Laterality: N/A;     OB History    Gravida  7   Para  2   Term      Preterm  2   AB  5   Living  1     SAB      TAB      Ectopic      Multiple  Live Births              Family History  Problem Relation Age of Onset  . Heart disease Mother   . Hypertension Mother   . Heart attack Mother   . Stroke Mother   . Heart disease Father   . Hypertension Father   . Diabetes Father   . Kidney failure Father   . Heart attack Father   . Stroke Father   . Heart disease Brother   . Diabetes Brother   . Kidney failure Brother   . Heart attack Brother   . Diabetes Paternal Grandmother     Social History   Tobacco Use  . Smoking status: Never Smoker  . Smokeless tobacco: Never Used  Substance Use Topics  . Alcohol use: No    Alcohol/week: 0.0 standard drinks  . Drug use: No    Home Medications Prior to Admission medications   Medication Sig Start Date End Date Taking? Authorizing Provider  albuterol (PROVENTIL HFA;VENTOLIN HFA) 108 (90 BASE) MCG/ACT inhaler Inhale 2 puffs into the lungs every 4 (four) hours as needed for wheezing or shortness of breath.    [provider]  Alcohol Swabs (B-D SINGLE USE SWABS REGULAR) PADS  03/05/19   [provider]  ALPRAZolam (XANAX) 1 MG tablet TAKE 1 TABLET BY MOUTH EVERY MORNING, 1 TABLET AT LUNCH, AND 1/2 TAB AT BEDTIME 09/20/18   [provider]    Artificial Tear GEL Place 2 drops into both eyes daily as needed (dry eyes).     [provider]  aspirin 325 MG tablet Take 325 mg by mouth daily.    [provider]  BIOTIN PO Take by mouth daily.    [provider]  cetirizine (ZYRTEC) 10 MG tablet Take 10 mg by mouth daily.    [provider]  Cholecalciferol (VITAMIN D-3) 1000 UNITS CAPS Take 1 capsule by mouth daily.     [provider]  clopidogrel (PLAVIX) 75 MG tablet TAKE 1 TABLET EVERY DAY 09/24/19   Belva Crome, MD  cyclobenzaprine (FLEXERIL) 5 MG tablet Take 1 tablet (5 mg total) by mouth at bedtime as needed for muscle spasms. 01/04/16   Oswald Hillock, MD  docusate sodium (COLACE) 250 MG capsule Take 250 mg by mouth daily.    [provider]  doxazosin (CARDURA) 4 MG tablet Take 4 mg by mouth at bedtime.     [provider]  DROPLET PEN NEEDLES 32G X 4 MM MISC  03/05/19   [provider]  EPINEPHrine 0.3 mg/0.3 mL IJ SOAJ injection AS DIRECTED USE THIS IF EXPOSED TO ALLERGEN AND REACTION INJECTION 30 DAYS 08/08/18   [provider]  ezetimibe (ZETIA) 10 MG tablet TAKE 1 TABLET EVERY DAY 09/24/19   Belva Crome, MD  fluticasone [facility]) 50 MCG/ACT nasal spray Place 2 sprays into both nostrils daily as needed for allergies or rhinitis.     [provider]  furosemide (LASIX) 80 MG tablet TAKE 1 TABLET TWICE A DAY. MAY TAKE AN ADDITIONAL 80MG  (1 TABLET) AS NEEDED FOR EDEMA 09/24/19   Belva Crome, MD  hydrALAZINE (APRESOLINE) 25 MG tablet Take 1 tablet (25 mg total) by mouth 2 (two) times a day. 04/27/19   Belva Crome, MD  isosorbide mononitrate (IMDUR) 60 MG 24 hr tablet TAKE 1 TABLET BY MOUTH EVERY DAY 12/31/19   Belva Crome, MD  Ketotifen Fumarate (ALAWAY OP) Apply to eye.  [provider]  LANTUS SOLOSTAR 100 UNIT/ML Solostar Pen Inject 10 Units into the skin daily. 04/10/19   [provider]  metoprolol succinate  (TOPROL-XL) 100 MG 24 hr tablet TAKE 2 TABLETS DAILY. TAKE WITH OR IMMEDIATELY FOLLOWING A MEAL 09/24/19   Belva Crome, MD  Multiple Vitamin (MULTIVITAMIN WITH MINERALS) TABS Take 1 tablet by mouth daily.    [provider]  nitroGLYCERIN (NITROSTAT) 0.4 MG SL tablet Place 1 tablet under the tongue every 5 minutes as needed for chest pain, max 3 doses, go to er if no relief 10/03/18   Belva Crome, MD  olmesartan (BENICAR) 40 MG tablet Take 1 tablet (40 mg total) by mouth daily. 10/01/19   Belva Crome, MD  ondansetron (ZOFRAN) 8 MG tablet Take 8 mg by mouth every 8 (eight) hours as needed for nausea or vomiting.  03/30/17   [provider]  OZEMPIC, 0.25 OR 0.5 MG/DOSE, 2 MG/1.5ML SOPN Inject 0.25 mg into the skin daily. 08/30/19   [provider]  pantoprazole (PROTONIX) 40 MG tablet Take 40 mg by mouth daily.    [provider]  potassium chloride (KLOR-CON) 10 MEQ tablet TAKE 2 TABLETS TWICE DAILY 11/19/19   Belva Crome, MD  simvastatin (ZOCOR) 20 MG tablet TAKE 2 TABLETS (40 MG TOTAL) BY MOUTH AT BEDTIME. 04/19/19   Belva Crome, MD  sodium chloride (MURO 128) 5 % ophthalmic ointment Place 1 drop into both eyes at bedtime. For dry eyes    [provider]  traMADol (ULTRAM) 50 MG tablet Take 100 mg by mouth every 6 (six) hours as needed (MIGRAINES).    [provider]  TRUE METRIX BLOOD GLUCOSE TEST test strip  02/27/19   [provider]  TRUEplus Lancets 33G MISC  02/27/19   [provider]  venlafaxine (EFFEXOR) 100 MG tablet See admin instructions. TAKE 200 mg by mouth in the morning and take 100 mg at non 04/12/11   [provider]  vitamin A 7500 UNIT capsule Take 2,400 Units by mouth daily.    [provider]    Allergies    Calcium channel blockers, Codeine, Lyrica [pregabalin], Other, Ramipril, Ticlid [ticlopidine hcl], Morphine and related, Tape, Digoxin and related, Metolazone, Myrbetriq  [mirabegron], Onglyza [saxagliptin], Penicillins, Spironolactone, Sulfa antibiotics, Tetracyclines & related, Vicodin [hydrocodone-acetaminophen], and Latex  Review of Systems   Review of Systems  Constitutional: Negative for chills and fever.  Respiratory: Positive for shortness of breath (resolved).   Cardiovascular: Positive for chest pain (resolved).  Gastrointestinal: Positive for abdominal pain (epigastric), nausea (resolved) and vomiting.  Neurological: Negative for syncope, light-headedness and headaches.  All other systems reviewed and are negative.   Physical Exam Updated Vital Signs BP 136/81 (BP Location: Right Arm)   Pulse 77   Temp 98.4 F (36.9 C) (Oral)   Resp (!) 22   Ht 5\' 4"  (1.626 m)   Wt 65.8 kg   SpO2 100%   BMI 24.89 kg/m   Physical Exam Vitals and nursing note reviewed.  Constitutional:      General: She is not in acute distress.    Appearance: Normal appearance. She is well-developed. She is not ill-appearing.  HENT:     Head: Normocephalic and atraumatic.  Eyes:     General: No scleral icterus.       Right eye: No discharge.        Left eye: No discharge.     Conjunctiva/sclera: Conjunctivae normal.  Pupils: Pupils are equal, round, and reactive to light.  Cardiovascular:     Rate and Rhythm: Normal rate and regular rhythm.  Pulmonary:     Effort: Pulmonary effort is normal. No respiratory distress.     Breath sounds: Normal breath sounds.  Abdominal:     General: There is no distension.     Palpations: Abdomen is soft.     Tenderness: There is abdominal tenderness (mild epigastric tenderness).  Musculoskeletal:     Cervical back: Normal range of motion.     Right lower leg: No edema.     Left lower leg: No edema.  Skin:    General: Skin is warm and dry.  Neurological:     Mental Status: She is alert and oriented to person, place, and time.  Psychiatric:        Behavior: Behavior normal.     ED Results / Procedures / Treatments    Labs (all labs ordered are listed, but only abnormal results are displayed) Labs Reviewed  CBC - Abnormal; Notable for the following components:      Result Value   RBC 3.51 (*)    Hemoglobin 10.5 (*)    HCT 31.1 (*)    Platelets 104 (*)    All other components within normal limits  PROTIME-INR    EKG EKG Interpretation  Date/Time:  Wednesday January 09 2020 11:32:00 EST Ventricular Rate:  73 PR Interval:    QRS Duration: 148 QT Interval:  446 QTC Calculation: 492 R Axis:   39 Text Interpretation: atrial-sensed ventricular-paced complexes Confirmed by Fredia Sorrow [phone]) on 01/09/2020 11:35:14 AM   Radiology DG Chest 2 View  Result Date: 01/09/2020 CLINICAL DATA:  Chest pain. EXAM: CHEST - 2 VIEW COMPARISON:  August 02, 2018. FINDINGS: The heart size and mediastinal contours are within normal limits. Both lungs are clear. Status post coronary bypass graft. Left-sided pacemaker is unchanged in position. No pneumothorax or pleural effusion is noted. The visualized skeletal structures are unremarkable. IMPRESSION: No active cardiopulmonary disease. Electronically Signed   By: Marijo Conception M.D.   On: 01/09/2020 12:13    Procedures Procedures (including critical care time)  Medications Ordered in ED Medications  sodium chloride flush (NS) 0.9 % injection 3 mL (3 mLs Intravenous Not Given 01/09/20 1155)    ED Course  I have reviewed the triage vital signs and the nursing notes.  Pertinent labs & imaging results that were available during my care of the patient were reviewed by me and considered in my medical decision making (see chart for details).  69 year old female with extensive cardiac history presents with acute epigastric abdominal pain radiating to the chest that resolved with ASA and nitro this morning. EKG shows atrial-sensed ventricular paced rhythm. Pacemaker was interrogated and showed no abnormal events. On exam she feels much better. She has mild epigastric  tenderness with palpation. Will obtain labs, trop, CXR.  CBC shows anemia (hgb 10) which is around her baseline. CMP shows elevated SCr (2.6) which is slightly higher than baseline. Lipase is normal. Initial trop is 10. Shared visit with Dr. Rogene Houston - will discuss with Cards.  Cards to see. 2nd trop is 10. Will await recommendations. Care signed out to Alvera Singh PA-C.  MDM Rules/Calculators/A&P                       Final Clinical Impression(s) / ED Diagnoses Final diagnoses:  Chest pain, unspecified type  Epigastric abdominal  pain  Chronic kidney disease, unspecified CKD stage    Rx / DC Orders ED Discharge Orders    None       Recardo Evangelist, PA-C 01/09/20 1544    Fredia Sorrow, MD 01/13/20 1714

## 2020-01-09 NOTE — ED Notes (Signed)
[employer] pacemaker interrogated °

## 2020-01-09 NOTE — ED Triage Notes (Signed)
At approximately 9:30, pt was laying down and had sudden occurrence of epigastric pain that felt like pt "was punched really hard", radiates to L shoulder. Approximately 45 min later, pt began to have nausea/vomitting. 2 nitros relieved pain from 10/10 to to 5/10. Extensive cardiac history, including multiple MI, defibrillator, pacemaker. Also has hx GERD. Denies this feeling similar to GERD symptoms or previous cardiac symptoms, but cardiac symptoms have presented differently between episodes. EKG for EMS ventricular paced rhythm.  Initially SOB but relieved en route, 100% on RA.  4 zofran relieved nausea en route.  Pt took 64 Asprin immediately PTA EMS arrival, unknown if this contributed to any relief.  Initial BP 190/82, once positioned comfortably and had 2 nitro pressure down to 110/71. HR 74, 22 RR, 100% on RA. CBG 263, had a few sugary drinks this AM but had lantus last night.  Denies COVID symptoms, had first vaccine last Friday.

## 2020-01-09 NOTE — Discharge Instructions (Addendum)
Your cardiologist is scheduling you for an outpatient stress test.  Please follow-up with the office regarding this.  Return to the emergency department with any worsening or changing chest pain, shortness of breath, fever, or other concerns.  The blood test for your heart today did not show any signs of a heart attack.  We do not see any signs of pneumonia on your chest x-ray.

## 2020-01-09 NOTE — ED Provider Notes (Signed)
4:06 PM signout from [employer] PA-C at shift change pending cards recommendations.   I spoke with cardiology attending who has seen patient and plan is to discharged home with outpatient stress test.  Reviewed note from cards PA [place]in.   BP 119/80   Pulse 71   Temp 98.4 F (36.9 C) (Oral)   Resp 14   Ht 5\' 4"  (1.626 m)   Wt 65.8 kg   SpO2 97%   BMI 24.89 kg/m     Carlisle Cater, PA-C 01/09/20 1608    Veryl Speak, MD 01/09/20 1943

## 2020-01-09 NOTE — Consult Note (Addendum)
Cardiology Admission History and Physical:   Patient ID: [name] MRN: [id number]; DOB: 1951-09-17   Admission date: 01/09/2020  Primary Care Provider: [name], MD Primary Cardiologist: Sinclair Grooms, MD  Primary Electrophysiologist:  None   Chief Complaint: Chest pain  Patient Profile:   [name] is a 69 y.o. female with history of CAD status post CABG 1998, ischemic cardiomyopathy, s/p BiV defibrillator (Medtronic), valve disease with moderate regurgitation, GERD and hiatal hernia, DM2, stroke, CKD, and being evaluated for chest pain.  History of Present Illness:   [name] followed by Dr. Tamala Julian.  History of CAD with CABG in 1998 LIMA to LAD, SVG to diagonal, SVG to PDA.  Echocardiogram in 12/2015 showed EF of 45 to 50% with grade 2 diastolic dysfunction, moderate MR and mild TR.  Last cath was 08/04/2018 showing chronic severe coronary artery disease, unchanged from prior angiogram with multivessel native disease and patent grafts x3.    The patient was last seen by Mickel Baas, NP on 09/19/2019 for regular follow-up. Had weight loss of 15 pounds attributed to diabetes medications.  Follows with Dr. Lovena Le for her defibrillator management. MDT CRT-D planted 04/17/2008, GEN change 2016.  Last seen 09/13/2019 reported left-sided chest pain worse with exertion thought to be musculoskeletal.  The patient presented to the ED for chest pain. AT 6 AM the patient drank glucerna and took her pills. About 3 hours later, at 9AM she had sudden onset upper epigastric pain. It was a pressure, 10/10 and radiated into her left shoulder. She had associated sob, nausea, and vomiting. The pain would last a little less than a minute and then improve. Pain was waxing and waning for the next hour. Her daughter called EMS who recommended she take Aspirin which did not seem to improve her pain. When EMS was there she was given NTG and this seemed to improve the pain.  By the time she  arrived to the ED she was chest pain free. Denies recent fever, chills, illness.   In the ED BP 136/81, pulse 77, afebrile, RR 22, 100% O2. Labs showed potassium 4.0, glucose 75, creatinine 2.64, BUN 25. WBC 6.3, Hgb 10.5. HS troponin 10. Lipase normal. LFTs wnl. CXR unremarkable. Medtronic rep reported no abnormal events, normal functionating device. Cardiology was consulted.   Heart Pathway Score:  HEAR Score: 5  Past Medical History:  Diagnosis Date  . AICD (automatic cardioverter/defibrillator) present    [employer]tr[employer]- Dr. Beckie Salts follows  . Anginal pain ([place])   . Anxiety   . Asthma   . Back pain    "pinched nerve-lower back" - Dr. Nelva Bush follows.  . Biventricular implantable cardioverter-defibrillator in situ 06/18/2011  . Cerebral infarction ([place]) 11/11/2012  . Cervical dysplasia   . Chronic diastolic heart failure ([employer]) 03/22/2016  . Chronic kidney disease    Dr. Posey Pronto follows.  . Chronic renal insufficiency, stage III (moderate) 11/11/2012  . Complication of anesthesia    "I wake up during surgeries" (02/14/2013)  . Coronary artery disease involving coronary bypass graft of native heart with angina pectoris ([place]) 06/18/2011  . Depression   . DM (diabetes mellitus) ([place]) 11/11/2012  . Fibroid   . Function kidney decreased   . GERD (gastroesophageal reflux disease)   . Hemiplegia, unspecified, affecting nondominant side 11/11/2012  . Hepatitis    Hepatitis A -college yrs"water source exposure"  . Hiatal hernia   . History of shingles    2-3 yrs ago last out break "around  waist"  . History of stomach ulcers   . Hyperlipidemia 07/30/2016  . Hypertension   . ICD (implantable cardiac defibrillator) in place   . Iron deficiency anemia   . Ischemic cardiomyopathy    status post biventricular ICD placed by DR Edumunds who used to see Dr Melvern Banker here to establish  cardiovascular care.  . Migraines   . MVP (mitral valve prolapse)    Antibiotics not required for procedures  . Myocardial  infarction ([place])    "I've had 2; the others they were able to catch before completing" (02/14/2013)  . Pacemaker   . Paroxysmal SVT (supraventricular tachycardia) ([place]) 06/18/2011  . Pneumonia 1950's & 1985  . Shortness of breath    "lying down flat; at times w/exertion" (02/14/2013). 10-06-15 exertion only..  . Stroke [facility])    "2 confirmed; 9 TIA's; results in dragging LLE; numbness in tip of tongue" (02/14/2013),10-06-15 right hand tends to be weaker when tired.  [name] TIA (transient ischemic attack) 11/11/2012  . Type II diabetes mellitus ([place])     Past Surgical History:  Procedure Laterality Date  . ABDOMINAL HYSTERECTOMY  1985   TAH   . BIV ICD GENERTAOR CHANGE OUT  06/2007; 04/2008   "2 lead initial placement, at [facility]; done at [facility], after developing CHF" (02/14/2013)  . BIV PACEMAKER GENERATOR CHANGE OUT N/A 01/29/2015   Procedure: BIV PACEMAKER GENERATOR CHANGE OUT;  Surgeon: Evans Lance, MD;  Location: [facility] CATH LAB;  Service: Cardiovascular;  Laterality: N/A;  . BREAST EXCISIONAL BIOPSY Left 01/2007; 06/2007; 03/2008   "benign" (02/14/2013)  . BREAST SURGERY    . CARDIAC CATHETERIZATION     "probably in the teens" (02/14/2013)  . CARDIAC DEFIBRILLATOR PLACEMENT    . COLONOSCOPY  ~ 2002  . COLONOSCOPY WITH PROPOFOL N/A 10/07/2015   Procedure: COLONOSCOPY WITH PROPOFOL;  Surgeon: Juanita Craver, MD;  Location: [facility] ENDOSCOPY;  Service: Endoscopy;  Laterality: N/A;  . CORONARY ANGIOPLASTY WITH STENT PLACEMENT     "started out w/5; bypass corrected some; 1 stent since the bypass" (02/14/2013)  . CORONARY ARTERY BYPASS GRAFT  ` 1998   LIMA-LAD, SVG-D1, SVG-PDA  . [place] OF UTERUS  1975 X 2; 1976; 1977  . INSERT / REPLACE / REMOVE PACEMAKER     biventricular defibrillator--06/10/ 2009  . LEFT HEART CATH AND CORS/GRAFTS ANGIOGRAPHY N/A 08/04/2018   Procedure: LEFT HEART CATH AND CORS/GRAFTS ANGIOGRAPHY;  Surgeon: Burnell Blanks, MD;  Location: [facility] CV LAB;  Service:  Cardiovascular;  Laterality: N/A;  . LEFT HEART CATHETERIZATION WITH CORONARY/GRAFT ANGIOGRAM N/A 01/03/2015   Procedure: LEFT HEART CATHETERIZATION WITH Beatrix Fetters;  Surgeon: Sinclair Grooms, MD;  Location[facility] CATH LAB;  Service: Cardiovascular;  Laterality: N/A;  . SUPRAVENTRICULAR TACHYCARDIA ABLATION  06/2007  . TEE WITHOUT CARDIOVERSION  11/14/2012   Procedure: TRANSESOPHAGEAL ECHOCARDIOGRAM (TEE);  Surgeon: Candee Furbish, MD;  Location: [facility] ENDOSCOPY;  Service: Cardiovascular;  Laterality: N/A;     Medications Prior to Admission: Prior to Admission medications   Medication Sig Start Date End Date Taking? Authorizing Provider  albuterol (PROVENTIL HFA;VENTOLIN HFA) 108 (90 BASE) MCG/ACT inhaler Inhale 2 puffs into the lungs every 4 (four) hours as needed for wheezing or shortness of breath.   Yes [provider]  ALPRAZolam Duanne Moron) 1 MG tablet Take 1 mg by mouth 3 (three) times daily.  09/20/18  Yes [provider]  Artificial Tear GEL Place 2 drops into both eyes daily as needed (dry eyes).  Yes [provider]  aspirin 325 MG tablet Take 325 mg by mouth daily.   Yes [provider]  BIOTIN PO Take by mouth daily.   Yes [provider]  cetirizine (ZYRTEC) 10 MG tablet Take 10 mg by mouth daily.   Yes [provider]  Cholecalciferol (VITAMIN D-3) 1000 UNITS CAPS Take 1,000 Units by mouth daily.    Yes [provider]  clopidogrel (PLAVIX) 75 MG tablet TAKE 1 TABLET EVERY DAY Patient taking differently: Take 75 mg by mouth daily. PLAVIX - Should not be held prior to cath procedure unless specifically ordered 09/24/19  Yes Belva Crome, MD  cyclobenzaprine (FLEXERIL) 5 MG tablet Take 1 tablet (5 mg total) by mouth at bedtime as needed for muscle spasms. 01/04/16  Yes Oswald Hillock, MD  docusate sodium (COLACE) 250 MG capsule Take 250 mg by mouth daily.   Yes [provider]  doxazosin (CARDURA) 4 MG tablet  Take 4 mg by mouth at bedtime.    Yes [provider]  EPINEPHrine 0.3 mg/0.3 mL IJ SOAJ injection Inject 0.3 mg into the muscle as needed for anaphylaxis.  08/08/18  Yes [provider]  ezetimibe (ZETIA) 10 MG tablet TAKE 1 TABLET EVERY DAY Patient taking differently: Take by mouth daily.  09/24/19  Yes Belva Crome, MD  Flaxseed, Linseed, (FLAXSEED OIL) 1200 MG CAPS Take 1,200 mg by mouth daily.   Yes [provider]  fluticasone (FLONASE) 50 MCG/ACT nasal spray Place 2 sprays into both nostrils daily as needed for allergies or rhinitis.    Yes [provider]  furosemide (LASIX) 80 MG tablet TAKE 1 TABLET TWICE A DAY. MAY TAKE AN ADDITIONAL 80MG  (1 TABLET) AS NEEDED FOR EDEMA Patient taking differently: Take 160 mg by mouth daily.  09/24/19  Yes Belva Crome, MD  hydrALAZINE (APRESOLINE) 25 MG tablet Take 1 tablet (25 mg total) by mouth 2 (two) times a day. 04/27/19  Yes Belva Crome, MD  isosorbide mononitrate (IMDUR) 60 MG 24 hr tablet TAKE 1 TABLET BY MOUTH EVERY DAY Patient taking differently: Take 60 mg by mouth daily.  12/31/19  Yes Belva Crome, MD  Ketotifen Fumarate (ALAWAY OP) Place 1 drop into both eyes daily as needed (allergies).    Yes [provider]  LANTUS SOLOSTAR 100 UNIT/ML Solostar Pen Inject 6 Units into the skin at bedtime.  04/10/19  Yes [provider]  metoprolol succinate (TOPROL-XL) 100 MG 24 hr tablet TAKE 2 TABLETS DAILY. TAKE WITH OR IMMEDIATELY FOLLOWING A MEAL Patient taking differently: Take 100 mg by mouth 2 (two) times daily. ** DO NOT CRUSH **    (BETA BLOCKER) 09/24/19  Yes Belva Crome, MD  Multiple Vitamin (MULTIVITAMIN WITH MINERALS) TABS Take 1 tablet by mouth daily.   Yes [provider]  nitroGLYCERIN (NITROSTAT) 0.4 MG SL tablet Place 1 tablet under the tongue every 5 minutes as needed for chest pain, max 3 doses, go to er if no relief 10/03/18  Yes Belva Crome, MD  olmesartan  (BENICAR) 40 MG tablet Take 1 tablet (40 mg total) by mouth daily. Patient taking differently: Take 20 mg by mouth daily.  10/01/19  Yes Belva Crome, MD  ondansetron (ZOFRAN) 8 MG tablet Take 8 mg by mouth every 8 (eight) hours as needed for nausea or vomiting.  03/30/17  Yes [provider]  OZEMPIC, 0.25 OR 0.5 MG/DOSE, 2 MG/1.5ML SOPN Inject 0.25 mg into  the skin once a week. Sunday 08/30/19  Yes [provider]  pantoprazole (PROTONIX) 40 MG tablet Take 40 mg by mouth daily.   Yes [provider]  potassium chloride (KLOR-CON) 10 MEQ tablet TAKE 2 TABLETS TWICE DAILY 11/19/19  Yes Belva Crome, MD  simvastatin (ZOCOR) 20 MG tablet TAKE 2 TABLETS (40 MG TOTAL) BY MOUTH AT BEDTIME. 04/19/19  Yes Belva Crome, MD  sodium chloride (MURO 128) 5 % ophthalmic ointment Place 1 drop into both eyes at bedtime. For dry eyes   Yes [provider]  traMADol (ULTRAM) 50 MG tablet Take 100 mg by mouth every 6 (six) hours as needed (MIGRAINES).   Yes [provider]  venlafaxine (EFFEXOR) 100 MG tablet Take 100-200 mg by mouth See admin instructions. TAKE 200 mg by mouth in the morning and take 100 mg at noon 04/12/11  Yes [provider]  vitamin A 7500 UNIT capsule Take 2,400 Units by mouth daily.   Yes [provider]  Alcohol Swabs (B-D SINGLE USE SWABS REGULAR) PADS  03/05/19   [provider]  DROPLET PEN NEEDLES 32G X 4 MM MISC  03/05/19   [provider]  TRUE METRIX BLOOD GLUCOSE TEST test strip  02/27/19   [provider]  TRUEplus Lancets 33G [place]  02/27/19   [provider]     Allergies:    Allergies  Allergen Reactions  . Calcium Channel Blockers Other (See Comments)    Cannot take non-DHP CCBs (diltiazem, verapamil) due to CHF requiring ICD  . Codeine Anaphylaxis  . Lyrica [Pregabalin]     Nightmares, and swelling  . Other Anaphylaxis    Walnuts, pecans, and ANY melons PATIENT IS A  VEGETARIAN   . Ramipril Anaphylaxis and Swelling  . Ticlid [Ticlopidine Hcl] Other (See Comments)    Unprovoked bleeding while on Ticlid (doesn't think she was on ASA at the time) Takes Plavix without problems  . Morphine And Related Other (See Comments)    Severe AMS, confusion, weakness Tolerates Fentanyl, Tramadol  . Tape Dermatitis and Rash    Tolerates paper tape  . Digoxin And Related Nausea Only    Unknown  . Metolazone Other (See Comments)    Cannot remember reaction  . Myrbetriq [Mirabegron] Other (See Comments)    Aggravates migraines  . Onglyza [Saxagliptin] Other (See Comments)    Exacerbates migraines  . Penicillins Hives    Has patient had a PCN reaction causing immediate rash, facial/tongue/throat swelling, SOB or lightheadedness with hypotension: Yes Has patient had a PCN reaction causing severe rash involving mucus membranes or skin necrosis: Unknown Has patient had a PCN reaction that required hospitalization: Unknown Has patient had a PCN reaction occurring within the last 10 years: No If all of the above answers are "NO", then may proceed with Cephalosporin use.   [name] Spironolactone Other (See Comments)    Cannot remember reaction  . Sulfa Antibiotics Hives and Other (See Comments)  . Tetracyclines & Related Other (See Comments)    Cannot remember reaction Tolerates macrolides (azithromycin)  . Vicodin [Hydrocodone-Acetaminophen] Other (See Comments)    EXTREME LETHARGY  . Latex Itching and Rash    Social History:   Social History   Socioeconomic History  . Marital status: Married    Spouse name: [name]  . Number of children: 1  . Years of education: MA  . Highest education level: Not on file  Occupational History    Employer: UNEMPLOYED  Comment: Disablity  Tobacco Use  . Smoking status: Never Smoker  . Smokeless tobacco: Never Used  Substance and Sexual Activity  . Alcohol use: No    Alcohol/week: 0.0 standard drinks  . Drug use: No  .  Sexual activity: Yes    Birth control/protection: Surgical  Other Topics Concern  . Not on file  Social History Narrative   Patient lives at home with spouse.     Daughter name is Tourist information centre manager.   Caffeine Use: none   Social Determinants of Health   Financial Resource Strain:   . Difficulty of Paying Living Expenses: Not on file  Food Insecurity:   . Worried About Charity fundraiser in the Last Year: Not on file  . Ran Out of Food in the Last Year: Not on file  Transportation Needs:   . Lack of Transportation (Medical): Not on file  . Lack of Transportation (Non-Medical): Not on file  Physical Activity:   . Days of Exercise per Week: Not on file  . Minutes of Exercise per Session: Not on file  Stress:   . Feeling of Stress : Not on file  Social Connections:   . Frequency of Communication with Friends and Family: Not on file  . Frequency of Social Gatherings with Friends and Family: Not on file  . Attends Religious Services: Not on file  . Active Member of Clubs or Organizations: Not on file  . Attends Archivist Meetings: Not on file  . Marital Status: Not on file  Intimate Partner Violence:   . Fear of Current or Ex-Partner: Not on file  . Emotionally Abused: Not on file  . Physically Abused: Not on file  . Sexually Abused: Not on file    Family History:   The patient's family history includes Diabetes in her brother, father, and paternal grandmother; Heart attack in her brother, father, and mother; Heart disease in her brother, father, and mother; Hypertension in her father and mother; Kidney failure in her brother and father; Stroke in her father and mother.    ROS:  Please see the history of present illness.  All other ROS reviewed and negative.     Physical Exam/Data:   Vitals:   01/09/20 1245 01/09/20 1345 01/09/20 1430 01/09/20 1500  BP: 136/87 128/77 124/86 124/79  Pulse: 74 70 71 76  Resp: 17 17 16 14   Temp:      TempSrc:      SpO2: 96% 97% 96% 100%    Weight:      Height:       No intake or output data in the 24 hours ending 01/09/20 1534 Last 3 Weights 01/09/2020 09/19/2019 09/13/2019  Weight (lbs) 145 lb 160 lb 12.8 oz 164 lb  Weight (kg) 65.772 kg 72.938 kg 74.39 kg     Body mass index is 24.89 kg/m.  General:  Well nourished, well developed, in no acute distress HEENT: normal Lymph: no adenopathy Neck: no JVD Endocrine:  No thryomegaly Vascular: No carotid bruits; FA pulses 2+ bilaterally without bruits  Cardiac:  normal S1, S2; RRR; + murmur  Lungs:  clear to auscultation bilaterally, no wheezing, rhonchi or rales  Abd: soft, nontender, no hepatomegaly  Ext: no edema Musculoskeletal:  No deformities, BUE and BLE strength normal and equal Skin: warm and dry  Neuro:  CNs 2-12 intact, no focal abnormalities noted Psych:  Normal affect    EKG:  The ECG that was done 01/09/20 was personally reviewed and demonstrates  A-sensed and Vpaced rhythy, 73 bpm, ST depression I, II, aVF, STE aVR and V1  Relevant CV Studies:  Echo 2017 Study Conclusions  - Left ventricle: The cavity size was normal. Systolic function was  mildly reduced. The estimated ejection fraction was in the range  of 45% to 50%. Diffuse hypokinesis. Features are consistent with  a pseudonormal left ventricular filling pattern, with concomitant  abnormal relaxation and increased filling pressure (grade 2  diastolic dysfunction).  - Mitral valve: There was moderate regurgitation.  - Left atrium: The atrium was moderately dilated.  - Right ventricle: Poorly visualized.  - Tricuspid valve: There was mild regurgitation.    Left heart cath 07/2018  Prox RCA lesion is 100% stenosed.  SVG graft was visualized by angiography and is normal in caliber.  Ost 1st Diag lesion is 100% stenosed.  SVG graft was visualized by angiography and is normal in caliber.  Prox LAD to Mid LAD lesion is 90% stenosed.  Mid LM to Dist LM lesion is 50% stenosed.  Mid Cx  to Dist Cx lesion is 60% stenosed.  Ost 2nd Mrg lesion is 50% stenosed.   1. Severe native vessel CAD s/p 3V CABG with 3/3 patent grafts 2. Mild distal left main stenosis 3. Severe mid LAD stenosis. Antegrade flow and retrograde flow in the mid and distal LAD. The LIMA to the mid LAD is patent. The Diagonal is occluded. The SVG to the Diagonal is patent.  4. The Circumflex is a large caliber artery with mild plaque in the mid segment. The distal AV groove Circumflex has chronic diffuse stenosis 5. The RCA is chronically occluded in the proximal segment. The SVG to the PDA is patent.   Recommendations: Continue medical management of CAD.  Laboratory Data:  High Sensitivity Troponin:   Recent Labs  Lab 01/09/20 1245 01/09/20 1415  TROPONINIHS 10 10      Chemistry Recent Labs  Lab 01/09/20 1245  NA 139  K 4.0  CL 100  CO2 28  GLUCOSE 75  BUN 25*  CREATININE 2.64*  CALCIUM 9.0  GFRNONAA 18*  GFRAA 21*  ANIONGAP 11    Recent Labs  Lab 01/09/20 1245  PROT 6.8  ALBUMIN 3.8  AST 32  ALT 26  ALKPHOS 44  BILITOT 0.7   Hematology Recent Labs  Lab 01/09/20 1140  WBC 6.3  RBC 3.51*  HGB 10.5*  HCT 31.1*  MCV 88.6  MCH 29.9  MCHC 33.8  RDW 12.4  PLT 104*   BNPNo results for input(s): BNP, PROBNP in the last 168 hours.  DDimer No results for input(s): DDIMER in the last 168 hours.   Radiology/Studies:  DG Chest 2 View  Result Date: 01/09/2020 CLINICAL DATA:  Chest pain. EXAM: CHEST - 2 VIEW COMPARISON:  August 02, 2018. FINDINGS: The heart size and mediastinal contours are within normal limits. Both lungs are clear. Status post coronary bypass graft. Left-sided pacemaker is unchanged in position. No pneumothorax or pleural effusion is noted. The visualized skeletal structures are unremarkable. IMPRESSION: No active cardiopulmonary disease. Electronically Signed   By: Marijo Conception M.D.   On: 01/09/2020 12:13   HEAR Score (for undifferentiated chest pain):   HEAR Score: 5    Assessment and Plan:   Atypical chest pain/CAD s/p CABG Patient presents with severe epigastric pressure radiating to left shoulder relieved by NTG. HS troponin 10. EKG unchanged. CXR unremarkable. LFTs, lipase wnl. creatinine 2.64. (2.04 in 2019) - Cath in 2019 showed patent  grafts x 3 and multivessel native disease - takes aspirin 325 mg daily (due to stroke) and Plavix, Imdur 60, Toprol 100 mg daily, and olmesartan 40 mg daily - second troponin came back at 10. - Given normal troponin x 2 would consider OP myoview stress test. Will discuss with MD  Ischemic cardiomyopathy/chronic diastolic dysfunction - LVEF 45-50%, diffuse hypokinesis, G2DD, moderate MR, LA moderately dilated, mild TR - Takes lasix 80mg  BID at home - no LLE or DOE. Euvolemic on exam - continue BB, ARB, hydralazine  GERD with hiatal hernia - Protonix 40 mg daily   HLD  - simvastatin 20 mg daily/zetia - LDL 92 03/2019  HTN - olmesartan 40 mg, Toprol 100 mg daily, Hydralazine 25mg  daily, Imdur 60 mg daily - pressures stable  Moderate MR - per echo in 2017 - Monitor with serial echos   For questions or updates, please contact [facility] Please consult [email or web address] for contact info under        Signed, Cadence Ninfa Meeker, PA-C  01/09/2020 3:34 PM   Patient seen, examined. Available data reviewed. Agree with findings, assessment, and plan as outlined by Cadence Kathlen Mody, PA-C.  The patient is independently interviewed and examined.  She is alert, oriented, in no distress.  Lung fields are clear, heart is regular rate and rhythm with no murmur or gallop, carotid upstrokes are normal, JVP is normal, abdomen is soft and nontender with no organomegaly.  Extremities have no edema.  The patient had an episode of chest and epigastric discomfort this morning after breakfast.  She did not eat anything unusual and in fact had her regular breakfast which includes Glucerna.  Her symptoms have completely  resolved.  Her high-sensitivity troponin is negative x2.  EKG shows atrial sensed ventricular paced rhythm and a biventricular pacing pattern.  The patient is well-appearing now and I feel comfortable letting her go home from the emergency room.  She understands to return for recurrent chest pain.  I think we should do an outpatient Lexiscan Myoview stress test to evaluate for ischemia.  I reviewed her last heart catheterization from 2019 which demonstrated severe native vessel coronary disease with continued patency of all of her bypass grafts.  No medication changes are made today.  Sherren Mocha, M.D. 01/09/2020 5:18 PM

## 2020-01-09 NOTE — ED Provider Notes (Signed)
Medical screening examination/treatment/procedure(s) were conducted as a shared visit with non-physician practitioner(s) and myself.  I personally evaluated the patient during the encounter.  EKG Interpretation  Date/Time:  Wednesday January 09 2020 11:32:00 EST Ventricular Rate:  73 PR Interval:    QRS Duration: 148 QT Interval:  446 QTC Calculation: 492 R Axis:   39 Text Interpretation: atrial-sensed ventricular-paced complexes Confirmed by Fredia Sorrow [phone]) on 01/09/2020 11:35:14 AM   Results for orders placed or performed during the hospital encounter of 01/09/20  CBC  Result Value Ref Range   WBC 6.3 4.0 - 10.5 K/uL   RBC 3.51 (L) 3.87 - 5.11 MIL/uL   Hemoglobin 10.5 (L) 12.0 - 15.0 g/dL   HCT 31.1 (L) 36.0 - 46.0 %   MCV 88.6 80.0 - 100.0 fL   MCH 29.9 26.0 - 34.0 pg   MCHC 33.8 30.0 - 36.0 g/dL   RDW 12.4 11.5 - 15.5 %   Platelets 104 (L) 150 - 400 K/uL   nRBC 0.0 0.0 - 0.2 %  Protime-INR (order if Patient is taking Coumadin / Warfarin)  Result Value Ref Range   Prothrombin Time 12.0 11.4 - 15.2 seconds   INR 0.9 0.8 - 1.2  Basic metabolic panel  Result Value Ref Range   Sodium 139 135 - 145 mmol/L   Potassium 4.0 3.5 - 5.1 mmol/L   Chloride 100 98 - 111 mmol/L   CO2 28 22 - 32 mmol/L   Glucose, Bld 75 70 - 99 mg/dL   BUN 25 (H) 8 - 23 mg/dL   Creatinine, Ser 2.64 (H) 0.44 - 1.00 mg/dL   Calcium 9.0 8.9 - 10.3 mg/dL   GFR calc non Af Amer 18 (L) >60 mL/min   GFR calc Af Amer 21 (L) >60 mL/min   Anion gap 11 5 - 15  Hepatic function panel  Result Value Ref Range   Total Protein 6.8 6.5 - 8.1 g/dL   Albumin 3.8 3.5 - 5.0 g/dL   AST 32 15 - 41 U/L   ALT 26 0 - 44 U/L   Alkaline Phosphatase 44 38 - 126 U/L   Total Bilirubin 0.7 0.3 - 1.2 mg/dL   Bilirubin, Direct <0.1 0.0 - 0.2 mg/dL   Indirect Bilirubin NOT CALCULATED 0.3 - 0.9 mg/dL  Lipase, blood  Result Value Ref Range   Lipase 37 11 - 51 U/L  Troponin I (High Sensitivity)  Result Value Ref Range   Troponin I (High Sensitivity) 10 <18 ng/L    Patient seen by me along with the physician assistant.  Patient at around 9:00 had acute onset epigastric abdominal pain that radiated towards the left side of the chest up towards the shoulder.  It was eventually relieved when EMS got involved and they gave her 1 of nitroglycerin.  And aspirin and it went away would like in 60 seconds.  Patient remains pain-free.  Patient has a paced rhythm as well as has an AICD.  It was interrogated and everything was functioning fine.  Significant history of ischemic cardiomyopathy coronary artery disease hypertension history of congestive heart failure.  Mitral valve prolapse.  History of type 2 diabetes.  History of TIAs.  History of chronic kidney disease.  Basically patient has a lot of significant risk factors.  Work-up here initial troponin was 10.  Chest x-ray without acute findings.  Labs without significant abnormalities.  Does show signs of the chronic renal failure.  We will discuss with cardiology.  Patient at the  very least will need a delta troponin.  Patient with a lot of risk factors.  Concerning story for the type of pain.  Pain did start out in the epigastric area but the radiation towards the left shoulder is very unusual for like gallbladder pain.  And patient's liver function test without any significant abnormalities.  And no leukocytosis.  Disposition as per discussion with cardiology.   Fredia Sorrow, MD 01/09/20 1426

## 2020-01-09 NOTE — ED Notes (Signed)
Per [employer] rep, no abnormal events, leads are all in correct placement. Device functioning as programmed.

## 2020-01-10 ENCOUNTER — Other Ambulatory Visit: Payer: Self-pay | Admitting: *Deleted

## 2020-01-10 DIAGNOSIS — R079 Chest pain, unspecified: Secondary | ICD-10-CM

## 2020-01-15 ENCOUNTER — Telehealth (HOSPITAL_COMMUNITY): Payer: Self-pay

## 2020-01-15 NOTE — Telephone Encounter (Signed)
Encounter complete. 

## 2020-01-16 ENCOUNTER — Other Ambulatory Visit: Payer: Self-pay

## 2020-01-16 ENCOUNTER — Encounter (HOSPITAL_COMMUNITY): Payer: Medicare Other

## 2020-01-16 MED ORDER — PANTOPRAZOLE SODIUM 40 MG PO TBEC: 40.0000 mg | DELAYED_RELEASE_TABLET | Freq: Every day | ORAL | 2 refills | Status: DC

## 2020-01-18 ENCOUNTER — Other Ambulatory Visit: Payer: Self-pay

## 2020-01-18 ENCOUNTER — Ambulatory Visit (HOSPITAL_COMMUNITY)
Admission: RE | Admit: 2020-01-18 | Discharge: 2020-01-18 | Disposition: A | Discharge status: Home / Self Care | Payer: Medicare Other | Source: Ambulatory Visit | Attending: Cardiology | Admitting: Cardiology

## 2020-01-18 DIAGNOSIS — R079 Chest pain, unspecified: Secondary | ICD-10-CM | POA: Insufficient documentation

## 2020-01-18 LAB — MYOCARDIAL PERFUSION IMAGING
LV dias vol: 97 mL (ref 46–106)
LV sys vol: 51 mL
Peak HR: 100 {beats}/min
Rest HR: 76 {beats}/min
SDS: 1
SRS: 1
SSS: 2
TID: 0.95

## 2020-01-18 MED ORDER — REGADENOSON 0.4 MG/5ML IV SOLN
0.4000 mg | Freq: Once | INTRAVENOUS | Status: AC
  Administered 2020-01-18: 0.4 mg via INTRAVENOUS

## 2020-01-18 MED ORDER — TECHNETIUM TC 99M TETROFOSMIN IV KIT
32.0000 | PACK | Freq: Once | INTRAVENOUS | Status: AC | PRN
  Administered 2020-01-18: 32 via INTRAVENOUS
  Filled 2020-01-18: qty 32

## 2020-01-18 MED ORDER — AMINOPHYLLINE 25 MG/ML IV SOLN
75.0000 mg | Freq: Once | INTRAVENOUS | Status: AC
  Administered 2020-01-18: 75 mg via INTRAVENOUS

## 2020-01-18 MED ORDER — TECHNETIUM TC 99M TETROFOSMIN IV KIT
9.4000 | PACK | Freq: Once | INTRAVENOUS | Status: AC | PRN
  Administered 2020-01-18: 9.4 via INTRAVENOUS
  Filled 2020-01-18: qty 10

## 2020-01-21 ENCOUNTER — Other Ambulatory Visit: Payer: Self-pay | Admitting: Medical

## 2020-01-21 DIAGNOSIS — I429 Cardiomyopathy, unspecified: Secondary | ICD-10-CM

## 2020-01-24 ENCOUNTER — Other Ambulatory Visit: Payer: Self-pay

## 2020-01-24 ENCOUNTER — Ambulatory Visit (HOSPITAL_COMMUNITY): Payer: Medicare Other | Attending: Cardiovascular Disease

## 2020-01-24 DIAGNOSIS — I130 Hypertensive heart and chronic kidney disease with heart failure and stage 1 through stage 4 chronic kidney disease, or unspecified chronic kidney disease: Secondary | ICD-10-CM | POA: Diagnosis not present

## 2020-01-24 DIAGNOSIS — I071 Rheumatic tricuspid insufficiency: Secondary | ICD-10-CM | POA: Insufficient documentation

## 2020-01-24 DIAGNOSIS — I428 Other cardiomyopathies: Secondary | ICD-10-CM

## 2020-01-24 DIAGNOSIS — E1122 Type 2 diabetes mellitus with diabetic chronic kidney disease: Secondary | ICD-10-CM | POA: Diagnosis not present

## 2020-01-24 DIAGNOSIS — Z951 Presence of aortocoronary bypass graft: Secondary | ICD-10-CM | POA: Insufficient documentation

## 2020-01-24 DIAGNOSIS — Z95810 Presence of automatic (implantable) cardiac defibrillator: Secondary | ICD-10-CM | POA: Diagnosis not present

## 2020-01-24 DIAGNOSIS — N189 Chronic kidney disease, unspecified: Secondary | ICD-10-CM | POA: Diagnosis not present

## 2020-01-24 DIAGNOSIS — J45909 Unspecified asthma, uncomplicated: Secondary | ICD-10-CM | POA: Insufficient documentation

## 2020-01-24 DIAGNOSIS — I509 Heart failure, unspecified: Secondary | ICD-10-CM | POA: Insufficient documentation

## 2020-01-24 DIAGNOSIS — I429 Cardiomyopathy, unspecified: Secondary | ICD-10-CM | POA: Diagnosis present

## 2020-01-24 DIAGNOSIS — I77810 Thoracic aortic ectasia: Secondary | ICD-10-CM | POA: Insufficient documentation

## 2020-01-24 DIAGNOSIS — I2510 Atherosclerotic heart disease of native coronary artery without angina pectoris: Secondary | ICD-10-CM | POA: Diagnosis not present

## 2020-01-24 DIAGNOSIS — E785 Hyperlipidemia, unspecified: Secondary | ICD-10-CM | POA: Diagnosis not present

## 2020-01-29 ENCOUNTER — Ambulatory Visit: Payer: Medicare Other | Attending: Internal Medicine

## 2020-01-29 DIAGNOSIS — Z23 Encounter for immunization: Secondary | ICD-10-CM

## 2020-01-29 NOTE — Progress Notes (Signed)
   Covid-19 Vaccination Clinic  Name:  [name]    MRN: [id number] DOB: 08-Feb-1951  01/29/2020  [name] was observed post Covid-19 immunization for 30 minutes based on pre-vaccination screening without incident. She was provided with Vaccine Information Sheet and instruction to access the V-Safe system.   [name] was instructed to call 911 with any severe reactions post vaccine: [name] Difficulty breathing  . Swelling of face and throat  . A fast heartbeat  . A bad rash all over body  . Dizziness and weakness   Immunizations Administered    Name Date Dose VIS Date Route   Pfizer COVID-19 Vaccine 01/29/2020  4:10 PM 0.3 mL 10/19/2019 Intramuscular   Manufacturer: [place]   Lot: [id number]   [place]: [id number]

## 2020-01-30 ENCOUNTER — Telehealth: Payer: Self-pay

## 2020-01-30 NOTE — Progress Notes (Signed)
Telehealth Visit     Virtual Visit via Video Note   This visit type was conducted due to national recommendations for restrictions regarding the COVID-19 Pandemic (e.g. social distancing) in an effort to limit this patient's exposure and mitigate transmission in our community.  Due to her co-morbid illnesses, this patient is at least at moderate risk for complications without adequate follow up.  This format is felt to be most appropriate for this patient at this time.  All issues noted in this document were discussed and addressed.  A limited physical exam was performed with this format.  Please refer to the patient's chart for her consent to telehealth for [facility].   Evaluation Performed:  Follow-up visit  This visit type was conducted due to national recommendations for restrictions regarding the COVID-19 Pandemic (e.g. social distancing).  This format is felt to be most appropriate for this patient at this time.  All issues noted in this document were discussed and addressed.  No physical exam was performed (except for noted visual exam findings with Video Visits).  Please refer to the patient's chart (MyChart message for video visits and phone note for telephone visits) for the patient's consent to telehealth for [facility].  Date:  02/04/2020   ID:  Emily, [name] 05-11-1951, MRN [id number]  Patient Location:  Home  Provider location:   [facility] Office  PCP:  [name], MD  Cardiologist:  Servando Snare & Sinclair Grooms, MD  Electrophysiologist:  None   Chief Complaint:  Follow up visit  History of Present Illness:    [name] is a 69 y.o. female who presents via audio/video conferencing for a telehealth visit today.  Seen for Dr. Tamala Julian.   She has a history of known CAD with remote CABG x 3 in 1998, ICM, underlying BiV ICD Lovena Le), MR - moderate, DM, CKD and prior ischemic stroke. Last cath was in 07/2018 with chronicseverecoronary disease,  unchanged from prior angiogram with multi-vessel native diseaseandpatent LIMA to LAD, patent SVG to Dx  and patent SVG to PDA.  Last seen by Dr. Tamala Julian in November of 2019.   She saw Cecilie Kicks, NP back in November - had had some mild chest pain - thought to be muscular. Lots of stress with caring for husband. Weight was down.   In the ER earlier this month with chest pain. Seen by Dr. Burt Knack - referred for Myoview and echo. These were reassuring.   The patient does not have symptoms concerning for COVID-19 infection (fever, chills, cough, or new shortness of breath).   Seen today via telephone call. She has consented for this visit. She declined video. Wanted to go over her test results. She is feeling better. No more chest pain. She is not sure what the etiology - has had prior gallbladder sludge noted on prior abdominal US. She follows with Dr. Posey Pronto for Nephrology. Saw him earlier this year. BP doing well. Tolerating her medicines without issue. Overall, feels better and has no concerns.   Past Medical History:  Diagnosis Date  . AICD (automatic cardioverter/defibrillator) present    [employer]- Dr. Beckie Salts follows  . Anginal pain ([place])   . Anxiety   . Asthma   . Back pain    "pinched nerve-lower back" - Dr. Nelva Bush follows.  . Biventricular implantable cardioverter-defibrillator in situ 06/18/2011  . Cerebral infarction ([place]) 11/11/2012  . Cervical dysplasia   . Chronic diastolic heart failure ([place]) 03/22/2016  . Chronic kidney  disease    Dr. Posey Pronto follows.  . Chronic renal insufficiency, stage III (moderate) 11/11/2012  . Complication of anesthesia    "I wake up during surgeries" (02/14/2013)  . Coronary artery disease involving coronary bypass graft of native heart with angina pectoris ([place]) 06/18/2011  . Depression   . DM (diabetes mellitus) ([place]) 11/11/2012  . Fibroid   . Function kidney decreased   . GERD (gastroesophageal reflux disease)   . Hemiplegia, unspecified, affecting  nondominant side 11/11/2012  . Hepatitis    Hepatitis A -college yrs"water source exposure"  . Hiatal hernia   . History of shingles    2-3 yrs ago last out break "around waist"  . History of stomach ulcers   . Hyperlipidemia 07/30/2016  . Hypertension   . ICD (implantable cardiac defibrillator) in place   . Iron deficiency anemia   . Ischemic cardiomyopathy    status post biventricular ICD placed by DR Edumunds who used to see Dr Melvern Banker here to establish  cardiovascular care.  . Migraines   . MVP (mitral valve prolapse)    Antibiotics not required for procedures  . Myocardial infarction ([place])    "I've had 2; the others they were able to catch before completing" (02/14/2013)  . Pacemaker   . Paroxysmal SVT (supraventricular tachycardia) ([place]) 06/18/2011  . Pneumonia 1950's & 1985  . Shortness of breath    "lying down flat; at times w/exertion" (02/14/2013). 10-06-15 exertion only..  . Stroke [facility])    "2 confirmed; 9 TIA's; results in dragging LLE; numbness in tip of tongue" (02/14/2013),10-06-15 right hand tends to be weaker when tired.  [name] TIA (transient ischemic attack) 11/11/2012  . Type II diabetes mellitus ([place])    Past Surgical History:  Procedure Laterality Date  . ABDOMINAL HYSTERECTOMY  1985   TAH   . BIV ICD GENERTAOR CHANGE OUT  06/2007; 04/2008   "2 lead initial placement, at [facility]; done at [facility], after developing CHF" (02/14/2013)  . BIV PACEMAKER GENERATOR CHANGE OUT N/A 01/29/2015   Procedure: BIV PACEMAKER GENERATOR CHANGE OUT;  Surgeon: Evans Lance, MD;  Location: [facility] C[facility] LAB;  Service: Cardiovascular;  Laterality: N/A;  . BREAST EXCISIONAL BIOPSY Left 01/2007; 06/2007; 03/2008   "benign" (02/14/2013)  . BREAST SURGERY    . CARDIAC CATHETERIZATION     "probably in the teens" (02/14/2013)  . CARDIAC DEFIBRILLATOR PLACEMENT    . COLONOSCOPY  ~ 2002  . COLONOSCOPY WITH PROPOFOL N/A 10/07/2015   Procedure: COLONOSCOPY WITH PROPOFOL;  Surgeon: Juanita Craver, MD;  Location: [facility] ENDOSCOPY;   Service: Endoscopy;  Laterality: N/A;  . CORONARY ANGIOPLASTY WITH STENT PLACEMENT     "started out w/5; bypass corrected some; 1 stent since the bypass" (02/14/2013)  . CORONARY ARTERY BYPASS GRAFT  ` 1998   LIMA-LAD, SVG-D1, SVG-PDA  . [place] OF UTERUS  1975 X 2; 1976; 1977  . INSERT / REPLACE / REMOVE PACEMAKER     biventricular defibrillator--06/10/ 2009  . LEFT HEART CATH AND CORS/GRAFTS ANGIOGRAPHY N/A 08/04/2018   Procedure: LEFT HEART CATH AND CORS/GRAFTS ANGIOGRAPHY;  Surgeon: Burnell Blanks, MD;  Location: [facility] CV LAB;  Service: Cardiovascular;  Laterality: N/A;  . LEFT HEART CATHETERIZATION WITH CORONARY/GRAFT ANGIOGRAM N/A 01/03/2015   Procedure: LEFT HEART CATHETERIZATION WITH Beatrix Fetters;  Surgeon: Sinclair Grooms, MD;  Location: [facility] CATH LAB;  Service: Cardiovascular;  Laterality: N/A;  . SUPRAVENTRICULAR TACHYCARDIA ABLATION  06/2007  . TEE WITHOUT CARDIOVERSION  11/14/2012  Procedure: TRANSESOPHAGEAL ECHOCARDIOGRAM (TEE);  Surgeon: Candee Furbish, MD;  Location[facility] ENDOSCOPY;  Service: Cardiovascular;  Laterality: N/A;     Current Meds  Medication Sig  . albuterol (PROVENTIL HFA;VENTOLIN HFA) 108 (90 BASE) MCG/ACT inhaler Inhale 2 puffs into the lungs every 4 (four) hours as needed for wheezing or shortness of breath.  . Alcohol Swabs (B-D SINGLE USE SWABS REGULAR) PADS   . ALPRAZolam (XANAX) 1 MG tablet Take 1 mg by mouth 3 (three) times daily.   . Artificial Tear GEL Place 2 drops into both eyes daily as needed (dry eyes).   [name] aspirin 325 MG EC tablet Take 325 mg by mouth daily.  [name] BIOTIN PO Take by mouth daily.  . cetirizine (ZYRTEC) 10 MG tablet Take 10 mg by mouth daily.  . Cholecalciferol (VITAMIN D-3) 1000 UNITS CAPS Take 1,000 Units by mouth daily.   . clopidogrel (PLAVIX) 75 MG tablet TAKE 1 TABLET EVERY DAY  . cyclobenzaprine (FLEXERIL) 5 MG tablet Take 1 tablet (5 mg total) by mouth at bedtime as needed for muscle spasms.   [name] docusate sodium (COLACE) 250 MG capsule Take 250 mg by mouth daily.  [name] doxazosin (CARDURA) 4 MG tablet Take 4 mg by mouth at bedtime.   . DROPLET PEN NEEDLES 32G X 4 MM MISC   . EPINEPHrine 0.3 mg/0.3 mL IJ SOAJ injection Inject 0.3 mg into the muscle as needed for anaphylaxis.   [name] ezetimibe (ZETIA) 10 MG tablet TAKE 1 TABLET EVERY DAY  . Flaxseed, Linseed, (FLAXSEED OIL) 1200 MG CAPS Take 1,200 mg by mouth daily.  . fluticasone (FLONASE) 50 MCG/ACT nasal spray Place 2 sprays into both nostrils daily as needed for allergies or rhinitis.   . furosemide (LASIX) 80 MG tablet TAKE 1 TABLET TWICE A DAY. MAY TAKE AN ADDITIONAL 80MG  (1 TABLET) AS NEEDED FOR EDEMA  . hydrALAZINE (APRESOLINE) 25 MG tablet Take 1 tablet (25 mg total) by mouth 2 (two) times a day.  . isosorbide mononitrate (IMDUR) 60 MG 24 hr tablet TAKE 1 TABLET BY MOUTH EVERY DAY  . Ketotifen Fumarate (ALAWAY OP) Place 1 drop into both eyes daily as needed (allergies).   [name] LANTUS SOLOSTAR 100 UNIT/ML Solostar Pen Inject 6 Units into the skin at bedtime.   . metoprolol succinate (TOPROL-XL) 100 MG 24 hr tablet TAKE 2 TABLETS DAILY. TAKE WITH OR IMMEDIATELY FOLLOWING A MEAL  . Multiple Vitamin (MULTIVITAMIN WITH MINERALS) TABS Take 1 tablet by mouth daily.  . nitroGLYCERIN (NITROSTAT) 0.4 MG SL tablet Place 1 tablet under the tongue every 5 minutes as needed for chest pain, max 3 doses, go to er if no relief  . olmesartan (BENICAR) 40 MG tablet Take 1 tablet (40 mg total) by mouth daily.  . ondansetron (ZOFRAN) 8 MG tablet Take 8 mg by mouth every 8 (eight) hours as needed for nausea or vomiting.   [name] OZEMPIC, 0.25 OR 0.5 MG/DOSE, 2 MG/1.5ML SOPN Inject 0.25 mg into the skin once a week. Sunday  . pantoprazole (PROTONIX) 40 MG tablet Take 1 tablet (40 mg total) by mouth daily.  . potassium chloride (KLOR-CON) 10 MEQ tablet TAKE 2 TABLETS TWICE DAILY  . simvastatin (ZOCOR) 20 MG tablet TAKE 2 TABLETS (40 MG TOTAL) BY MOUTH AT BEDTIME.  .  sodium chloride (MURO 128) 5 % ophthalmic ointment Place 1 drop into both eyes at bedtime. For dry eyes  . traMADol (ULTRAM) 50 MG tablet Take 100 mg by mouth every 6 (six) hours as  needed (MIGRAINES).  . TRUE METRIX BLOOD GLUCOSE TEST test strip   . TRUEplus Lancets 33G MISC   . venlafaxine (EFFEXOR) 100 MG tablet Take 100-200 mg by mouth See admin instructions. TAKE 200 mg by mouth in the morning and take 100 mg at noon  . vitamin A 7500 UNIT capsule Take 2,400 Units by mouth daily.  . [DISCONTINUED] aspirin 325 MG tablet Take 325 mg by mouth daily.     Allergies:   Calcium channel blockers, Codeine, Lyrica [pregabalin], Other, Ramipril, Ticlid [ticlopidine hcl], Morphine and related, Tape, Digoxin and related, Metolazone, Myrbetriq [mirabegron], Onglyza [saxagliptin], Penicillins, Spironolactone, Sulfa antibiotics, Tetracyclines & related, Vicodin [hydrocodone-acetaminophen], and Latex   Social History   Tobacco Use  . Smoking status: Never Smoker  . Smokeless tobacco: Never Used  Substance Use Topics  . Alcohol use: No    Alcohol/week: 0.0 standard drinks  . Drug use: No     Family Hx: The patient's family history includes Diabetes in her brother, father, and paternal grandmother; Heart attack in her brother, father, and mother; Heart disease in her brother, father, and mother; Hypertension in her father and mother; Kidney failure in her brother and father; Stroke in her father and mother.  ROS:   Please see the history of present illness.   All other systems reviewed are negative.    Objective:    Vital Signs:  BP 117/83   Pulse 73   Temp (!) 97.5 F (36.4 C)   Ht 5\' 4"  (1.626 m)   Wt 145 lb 3.2 oz (65.9 kg)   BMI 24.92 kg/m    Wt Readings from Last 3 Encounters:  02/04/20 145 lb 3.2 oz (65.9 kg)  01/18/20 145 lb (65.8 kg)  01/09/20 145 lb (65.8 kg)    Alert female in no acute distress. Does not sound short of breath with conversation.    Labs/Other Tests and  Data Reviewed:    Lab Results  Component Value Date   WBC 6.3 01/09/2020   HGB 10.5 (L) 01/09/2020   HCT 31.1 (L) 01/09/2020   PLT 104 (L) 01/09/2020   GLUCOSE 75 01/09/2020   CHOL 179 04/06/2019   TRIG 129 04/06/2019   HDL 61 04/06/2019   LDLCALC 92 04/06/2019   ALT 26 01/09/2020   AST 32 01/09/2020   NA 139 01/09/2020   K 4.0 01/09/2020   CL 100 01/09/2020   CREATININE 2.64 (H) 01/09/2020   BUN 25 (H) 01/09/2020   CO2 28 01/09/2020   TSH 1.353 01/02/2015   INR 0.9 01/09/2020   HGBA1C 6.5 (H) 04/06/2019     BNP (last 3 results) No results for input(s): BNP in the last 8760 hours.  ProBNP (last 3 results) No results for input(s): PROBNP in the last 8760 hours.    Prior CV studies:    The following studies were reviewed today:   ECHO IMPRESSIONS 01/2020  1. Mild global hypokinesis. Left ventricular ejection fraction, by  estimation, is 50 to 55%. The left ventricle has low normal function. The  left ventricle demonstrates global hypokinesis. Left ventricular diastolic  parameters are consistent with Grade  II diastolic dysfunction (pseudonormalization). The average left  ventricular global longitudinal strain is -15.9 %.  2. Right ventricular systolic function is mildly reduced. The right  ventricular size is normal. There is normal pulmonary artery systolic  pressure.  3. Left atrial size was mildly dilated.  4. Right atrial size was mildly dilated.  5. The mitral valve is normal in  structure. Trivial mitral valve  regurgitation. No evidence of mitral stenosis.  6. Tricuspid valve regurgitation is mild to moderate.  7. The aortic valve is tricuspid. Aortic valve regurgitation is not  visualized. No aortic stenosis is present.  8. Aortic dilatation noted. There is mild dilatation of the ascending  aorta measuring 37 mm.  9. The inferior vena cava is normal in size with greater than 50%  respiratory variability, suggesting right atrial pressure of 3  mmHg.    Myoview Study Highlights 01/2020   Nuclear stress EF is calculated at 47% but visually appears higher.  The left ventricular ejection fraction is mildly decreased (45-54%).  There was no ST segment deviation noted during stress.  The perfusion study is normal.  This is a low risk study.  Recommend 2D echo to assess LVF.    LEFT HEART CATH AND CORS/GRAFTS ANGIOGRAPHY9/27/2019  Conclusion     Prox RCA lesion is 100% stenosed.  SVG graft was visualized by angiography and is normal in caliber.  Ost 1st Diag lesion is 100% stenosed.  SVG graft was visualized by angiography and is normal in caliber.  Prox LAD to Mid LAD lesion is 90% stenosed.  Mid LM to Dist LM lesion is 50% stenosed.  Mid Cx to Dist Cx lesion is 60% stenosed.  Ost 2nd Mrg lesion is 50% stenosed.  1. Severe native vessel CAD s/p 3V CABG with 3/3 patent grafts 2. Mild distal left main stenosis 3. Severe mid LAD stenosis. Antegrade flow and retrograde flow in the mid and distal LAD. The LIMA to the mid LAD is patent. The Diagonal is occluded. The SVG to the Diagonal is patent.  4. The Circumflex is a large caliber artery with mild plaque in the mid segment. The distal AV groove Circumflex has chronic diffuse stenosis 5. The RCA is chronically occluded in the proximal segment. The SVG to the PDA is patent.   Recommendations: Continue medical management of CAD     ASSESSMENT & PLAN:    1. Prior chest pain - history of known CAD with prior CABG - last cath in 2019 with patent grafts x 3 - now with low risk Myoview which is reassuring. She has had no more chest pain and is now doing better.   2. Prior stroke - remains on high dose aspirin. Not discussed.   3. CKD - followed by Nephrology.   4. Ischemic CM with chronic combined systolic and diastolic dysfunction - most recent echo shows improvement in EF - has good BP control. No changes made today.   5. Underlying BiV/ICD - followed by  EP  6. HLD - on statin   7. GERD - she remains on PPI therapy - this may have been her issue for the recent visit to the ER.   8. HTN -BP is good - no changes made.   9. Valvular heart disease - most recent echo noted - would follow.   10. DM - per PCP  11. Chronic anemia - per PCP and Renal.   12. COVID-19 Education: The signs and symptoms of COVID-19 were discussed with the patient and how to seek care for testing (follow up with PCP or arrange E-visit).  The importance of social distancing, staying at home, hand hygiene and wearing a mask when out in public were discussed today.  Patient Risk:   After full review of this patient's clinical status, I feel that they are at least moderate risk at this time.  Time:  Today, I have spent 8 minutes with the patient with telehealth technology discussing the above issues.     Medication Adjustments/Labs and Tests Ordered: Current medicines are reviewed at length with the patient today.  Concerns regarding medicines are outlined above.   Tests Ordered: No orders of the defined types were placed in this encounter.   Medication Changes: No orders of the defined types were placed in this encounter.   Disposition:  FU with Dr. Tamala Julian as planned in May and Dr. Lovena Le later this year.    Patient is agreeable to this plan and will call if any problems develop in the interim.   Amie Critchley, NP  02/04/2020 2:02 PM    [facility]

## 2020-01-30 NOTE — Telephone Encounter (Signed)
-----   Message from [place], PA-C sent at 01/30/2020 12:15 PM EDT ----- Please call patient to inform her that her ECHO showed low normal pump function with some wall stiffness otherwise stable. Patient has an appointment next week at [address].

## 2020-01-30 NOTE — Telephone Encounter (Signed)
The patient has been notified of the Echo result and verbalized understanding.  All questions (if any) were answered. Frederik Schmidt, RN 01/30/2020 12:49 PM

## 2020-01-30 NOTE — Telephone Encounter (Signed)
Spoke to the patient.

## 2020-02-04 ENCOUNTER — Other Ambulatory Visit: Payer: Self-pay

## 2020-02-04 ENCOUNTER — Telehealth: Payer: Self-pay | Admitting: Nurse Practitioner

## 2020-02-04 ENCOUNTER — Encounter: Payer: Self-pay | Admitting: Nurse Practitioner

## 2020-02-04 ENCOUNTER — Telehealth (INDEPENDENT_AMBULATORY_CARE_PROVIDER_SITE_OTHER): Payer: Medicare Other | Admitting: Nurse Practitioner

## 2020-02-04 VITALS — BP 117/83 | HR 73 | Temp 97.5°F | Ht 64.0 in | Wt 145.2 lb

## 2020-02-04 DIAGNOSIS — D649 Anemia, unspecified: Secondary | ICD-10-CM

## 2020-02-04 DIAGNOSIS — I2510 Atherosclerotic heart disease of native coronary artery without angina pectoris: Secondary | ICD-10-CM

## 2020-02-04 DIAGNOSIS — N189 Chronic kidney disease, unspecified: Secondary | ICD-10-CM | POA: Diagnosis not present

## 2020-02-04 DIAGNOSIS — I130 Hypertensive heart and chronic kidney disease with heart failure and stage 1 through stage 4 chronic kidney disease, or unspecified chronic kidney disease: Secondary | ICD-10-CM

## 2020-02-04 DIAGNOSIS — E785 Hyperlipidemia, unspecified: Secondary | ICD-10-CM

## 2020-02-04 DIAGNOSIS — I10 Essential (primary) hypertension: Secondary | ICD-10-CM

## 2020-02-04 DIAGNOSIS — I429 Cardiomyopathy, unspecified: Secondary | ICD-10-CM

## 2020-02-04 DIAGNOSIS — Z712 Person consulting for explanation of examination or test findings: Secondary | ICD-10-CM | POA: Diagnosis not present

## 2020-02-04 DIAGNOSIS — K219 Gastro-esophageal reflux disease without esophagitis: Secondary | ICD-10-CM

## 2020-02-04 DIAGNOSIS — Z8673 Personal history of transient ischemic attack (TIA), and cerebral infarction without residual deficits: Secondary | ICD-10-CM

## 2020-02-04 DIAGNOSIS — Z95810 Presence of automatic (implantable) cardiac defibrillator: Secondary | ICD-10-CM

## 2020-02-04 DIAGNOSIS — I509 Heart failure, unspecified: Secondary | ICD-10-CM

## 2020-02-04 DIAGNOSIS — E782 Mixed hyperlipidemia: Secondary | ICD-10-CM

## 2020-02-04 DIAGNOSIS — E119 Type 2 diabetes mellitus without complications: Secondary | ICD-10-CM

## 2020-02-04 DIAGNOSIS — I255 Ischemic cardiomyopathy: Secondary | ICD-10-CM

## 2020-02-04 DIAGNOSIS — Z951 Presence of aortocoronary bypass graft: Secondary | ICD-10-CM

## 2020-02-04 NOTE — Telephone Encounter (Signed)
New messag3e:    Patient has a appt today and would like to know if this could be a virtual apt. Please call patient back.

## 2020-02-04 NOTE — Telephone Encounter (Signed)
Ok  Cecille Rubin

## 2020-02-04 NOTE — Patient Instructions (Addendum)
After Visit Summary:  We will be checking the following labs today - NONE   Medication Instructions:    Continue with your current medicines.    If you need a refill on your cardiac medications before your next appointment, please call your pharmacy.     Testing/Procedures To Be Arranged:  N/A  Follow-Up:   See Dr. Tamala Julian as planned in May - likes afternoon.   See Dr. Lovena Le as planned later this year.    At [facility], you and your health needs are our priority.  As part of our continuing mission to provide you with exceptional heart care, we have created designated Provider Care Teams.  These Care Teams include your primary Cardiologist (physician) and Advanced Practice Providers (APPs -  Physician Assistants and Nurse Practitioners) who all work together to provide you with the care you need, when you need it.  Special Instructions:  . Stay safe, stay home, wash your hands for at least 20 seconds and wear a mask when out in public.  . It was good to talk with you today.    Call the [place] office at [phone] if you have any questions, problems or concerns.

## 2020-02-04 NOTE — Telephone Encounter (Signed)

## 2020-02-04 NOTE — Telephone Encounter (Signed)
S/w pt, Pt is all set up for VT visit today per pt.

## 2020-02-06 ENCOUNTER — Other Ambulatory Visit (HOSPITAL_COMMUNITY): Payer: Medicare Other

## 2020-02-11 ENCOUNTER — Ambulatory Visit (INDEPENDENT_AMBULATORY_CARE_PROVIDER_SITE_OTHER): Payer: Medicare Other | Admitting: *Deleted

## 2020-02-11 DIAGNOSIS — Z95810 Presence of automatic (implantable) cardiac defibrillator: Secondary | ICD-10-CM | POA: Diagnosis not present

## 2020-02-11 LAB — CUP PACEART REMOTE DEVICE CHECK
Battery Remaining Longevity: 34 mo
Battery Voltage: 2.96 V
Brady Statistic AP VP Percent: 0.05 %
Brady Statistic AP VS Percent: 0.02 %
Brady Statistic AS VP Percent: 98.62 %
Brady Statistic AS VS Percent: 1.32 %
Brady Statistic RA Percent Paced: 0.06 %
Brady Statistic RV Percent Paced: 0.42 %
Date Time Interrogation Session: 20210405001604
HighPow Impedance: 37 Ohm
HighPow Impedance: 49 Ohm
Implantable Lead Implant Date: 20090610
Implantable Lead Implant Date: 20090610
Implantable Lead Implant Date: 20090610
Implantable Lead Location: 753858
Implantable Lead Location: 753859
Implantable Lead Location: 753860
Implantable Lead Model: 4194
Implantable Lead Model: 5076
Implantable Lead Model: 6947
Implantable Pulse Generator Implant Date: 20160323
Lead Channel Impedance Value: 247 Ohm
Lead Channel Impedance Value: 361 Ohm
Lead Channel Impedance Value: 456 Ohm
Lead Channel Impedance Value: 475 Ohm
Lead Channel Impedance Value: 532 Ohm
Lead Channel Impedance Value: 646 Ohm
Lead Channel Pacing Threshold Amplitude: 0.75 V
Lead Channel Pacing Threshold Amplitude: 1 V
Lead Channel Pacing Threshold Amplitude: 1.25 V
Lead Channel Pacing Threshold Pulse Width: 0.4 ms
Lead Channel Pacing Threshold Pulse Width: 0.4 ms
Lead Channel Pacing Threshold Pulse Width: 0.4 ms
Lead Channel Sensing Intrinsic Amplitude: 2.25 mV
Lead Channel Sensing Intrinsic Amplitude: 2.25 mV
Lead Channel Sensing Intrinsic Amplitude: 2.25 mV
Lead Channel Sensing Intrinsic Amplitude: 2.25 mV
Lead Channel Setting Pacing Amplitude: 2 V
Lead Channel Setting Pacing Amplitude: 2 V
Lead Channel Setting Pacing Amplitude: 2.25 V
Lead Channel Setting Pacing Pulse Width: 0.4 ms
Lead Channel Setting Pacing Pulse Width: 0.4 ms
Lead Channel Setting Sensing Sensitivity: 0.3 mV

## 2020-02-13 NOTE — Progress Notes (Signed)
ICD Remote  

## 2020-02-14 ENCOUNTER — Other Ambulatory Visit: Payer: Self-pay

## 2020-02-14 MED ORDER — CLOPIDOGREL BISULFATE 75 MG PO TABS: 75.0000 mg | ORAL_TABLET | Freq: Every day | ORAL | 3 refills | Status: DC

## 2020-02-14 MED ORDER — FUROSEMIDE 80 MG PO TABS: ORAL_TABLET | ORAL | 3 refills | Status: DC

## 2020-02-14 MED ORDER — OLMESARTAN MEDOXOMIL 40 MG PO TABS: 40.0000 mg | ORAL_TABLET | Freq: Every day | ORAL | 3 refills | Status: DC

## 2020-02-14 MED ORDER — EZETIMIBE 10 MG PO TABS: 10.0000 mg | ORAL_TABLET | Freq: Every day | ORAL | 3 refills | Status: DC

## 2020-02-14 MED ORDER — METOPROLOL SUCCINATE ER 100 MG PO TB24: ORAL_TABLET | ORAL | 3 refills | Status: DC

## 2020-02-14 MED ORDER — POTASSIUM CHLORIDE CRYS ER 10 MEQ PO TBCR: EXTENDED_RELEASE_TABLET | ORAL | 3 refills | Status: DC

## 2020-02-14 MED ORDER — ISOSORBIDE MONONITRATE ER 60 MG PO TB24: 60.0000 mg | ORAL_TABLET | Freq: Every day | ORAL | 3 refills | Status: DC
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
## 2020-03-13 ENCOUNTER — Other Ambulatory Visit: Payer: Self-pay

## 2020-03-13 ENCOUNTER — Encounter: Payer: Self-pay | Admitting: Interventional Cardiology

## 2020-03-13 ENCOUNTER — Ambulatory Visit: Payer: Medicare Other | Admitting: Interventional Cardiology

## 2020-03-13 VITALS — BP 122/68 | HR 80 | Ht 64.0 in | Wt 148.4 lb

## 2020-03-13 DIAGNOSIS — Z95810 Presence of automatic (implantable) cardiac defibrillator: Secondary | ICD-10-CM

## 2020-03-13 DIAGNOSIS — Z7189 Other specified counseling: Secondary | ICD-10-CM

## 2020-03-13 DIAGNOSIS — N1832 Chronic kidney disease, stage 3b: Secondary | ICD-10-CM

## 2020-03-13 DIAGNOSIS — I5022 Chronic systolic (congestive) heart failure: Secondary | ICD-10-CM | POA: Diagnosis not present

## 2020-03-13 DIAGNOSIS — E119 Type 2 diabetes mellitus without complications: Secondary | ICD-10-CM

## 2020-03-13 DIAGNOSIS — I10 Essential (primary) hypertension: Secondary | ICD-10-CM

## 2020-03-13 DIAGNOSIS — Z794 Long term (current) use of insulin: Secondary | ICD-10-CM

## 2020-03-13 DIAGNOSIS — E782 Mixed hyperlipidemia: Secondary | ICD-10-CM | POA: Diagnosis not present

## 2020-03-13 DIAGNOSIS — I2510 Atherosclerotic heart disease of native coronary artery without angina pectoris: Secondary | ICD-10-CM

## 2020-03-13 NOTE — Patient Instructions (Signed)

## 2020-04-04 ENCOUNTER — Other Ambulatory Visit: Payer: Self-pay

## 2020-04-04 MED ORDER — METOPROLOL SUCCINATE ER 100 MG PO TB24: ORAL_TABLET | ORAL | 3 refills | Status: DC

## 2020-04-04 MED ORDER — PANTOPRAZOLE SODIUM 40 MG PO TBEC: 40.0000 mg | DELAYED_RELEASE_TABLET | Freq: Every day | ORAL | 3 refills | Status: DC

## 2020-04-04 MED ORDER — HYDRALAZINE HCL 25 MG PO TABS: 25.0000 mg | ORAL_TABLET | Freq: Two times a day (BID) | ORAL | 3 refills | Status: DC

## 2020-04-08 MED ORDER — EZETIMIBE 10 MG PO TABS: 10.0000 mg | ORAL_TABLET | Freq: Every day | ORAL | 3 refills | Status: DC

## 2020-04-08 MED ORDER — POTASSIUM CHLORIDE CRYS ER 10 MEQ PO TBCR: EXTENDED_RELEASE_TABLET | ORAL | 3 refills | Status: DC

## 2020-04-08 MED ORDER — SIMVASTATIN 20 MG PO TABS: 40.0000 mg | ORAL_TABLET | Freq: Every day | ORAL | 3 refills | Status: DC

## 2020-04-08 MED ORDER — ISOSORBIDE MONONITRATE ER 60 MG PO TB24: 60.0000 mg | ORAL_TABLET | Freq: Every day | ORAL | 3 refills | Status: DC

## 2020-04-08 NOTE — Addendum Note (Signed)
Addended by: Carter Kitten D on: 04/08/2020 12:28 PM   Modules accepted: Orders

## 2020-04-09 ENCOUNTER — Other Ambulatory Visit: Payer: Self-pay

## 2020-04-09 ENCOUNTER — Telehealth: Payer: Self-pay

## 2020-04-09 MED ORDER — SIMVASTATIN 40 MG PO TABS: 40.0000 mg | ORAL_TABLET | Freq: Every day | ORAL | 3 refills | Status: DC

## 2020-04-16 MED ORDER — OLMESARTAN MEDOXOMIL 40 MG PO TABS: 40.0000 mg | ORAL_TABLET | Freq: Every day | ORAL | 3 refills | Status: DC

## 2020-04-16 NOTE — Addendum Note (Signed)
Addended by: Carter Kitten D on: 04/16/2020 09:51 AM   Modules accepted: Orders

## 2020-04-29 ENCOUNTER — Other Ambulatory Visit: Payer: Self-pay

## 2020-04-29 MED ORDER — CLOPIDOGREL BISULFATE 75 MG PO TABS: 75.0000 mg | ORAL_TABLET | Freq: Every day | ORAL | 3 refills | Status: DC

## 2020-05-14 ENCOUNTER — Ambulatory Visit (INDEPENDENT_AMBULATORY_CARE_PROVIDER_SITE_OTHER): Payer: Medicare Other | Admitting: *Deleted

## 2020-05-14 DIAGNOSIS — I255 Ischemic cardiomyopathy: Secondary | ICD-10-CM

## 2020-05-14 DIAGNOSIS — I5032 Chronic diastolic (congestive) heart failure: Secondary | ICD-10-CM | POA: Diagnosis not present

## 2020-05-14 LAB — CUP PACEART REMOTE DEVICE CHECK
Battery Remaining Longevity: 30 mo
Battery Voltage: 2.95 V
Brady Statistic AP VP Percent: 0.03 %
Brady Statistic AP VS Percent: 0.01 %
Brady Statistic AS VP Percent: 98.65 %
Brady Statistic AS VS Percent: 1.31 %
Brady Statistic RA Percent Paced: 0.04 %
Brady Statistic RV Percent Paced: 0.04 %
Date Time Interrogation Session: 20210707043624
HighPow Impedance: 38 Ohm
HighPow Impedance: 47 Ohm
Implantable Lead Implant Date: 20090610
Implantable Lead Implant Date: 20090610
Implantable Lead Implant Date: 20090610
Implantable Lead Location: 753858
Implantable Lead Location: 753859
Implantable Lead Location: 753860
Implantable Lead Model: 4194
Implantable Lead Model: 5076
Implantable Lead Model: 6947
Implantable Pulse Generator Implant Date: 20160323
Lead Channel Impedance Value: 247 Ohm
Lead Channel Impedance Value: 361 Ohm
Lead Channel Impedance Value: 418 Ohm
Lead Channel Impedance Value: 456 Ohm
Lead Channel Impedance Value: 513 Ohm
Lead Channel Impedance Value: 665 Ohm
Lead Channel Pacing Threshold Amplitude: 0.75 V
Lead Channel Pacing Threshold Amplitude: 0.875 V
Lead Channel Pacing Threshold Amplitude: 1.375 V
Lead Channel Pacing Threshold Pulse Width: 0.4 ms
Lead Channel Pacing Threshold Pulse Width: 0.4 ms
Lead Channel Pacing Threshold Pulse Width: 0.4 ms
Lead Channel Sensing Intrinsic Amplitude: 1.875 mV
Lead Channel Sensing Intrinsic Amplitude: 1.875 mV
Lead Channel Sensing Intrinsic Amplitude: 2.75 mV
Lead Channel Sensing Intrinsic Amplitude: 2.75 mV
Lead Channel Setting Pacing Amplitude: 1.5 V
Lead Channel Setting Pacing Amplitude: 2 V
Lead Channel Setting Pacing Amplitude: 2.5 V
Lead Channel Setting Pacing Pulse Width: 0.4 ms
Lead Channel Setting Pacing Pulse Width: 0.4 ms
Lead Channel Setting Sensing Sensitivity: 0.3 mV

## 2020-05-16 NOTE — Progress Notes (Signed)
Remote ICD transmission.   

## 2020-07-02 ENCOUNTER — Telehealth: Payer: Self-pay | Admitting: Internal Medicine

## 2020-07-02 NOTE — Telephone Encounter (Signed)
  1. Has your device fired? no  2. Is you device beeping? no  3. Are you experiencing draining or swelling at device site? no  4. Are you calling to see if we received your device transmission? no  5. Have you passed out? no   Patient states her device has been purring for the past 2 weeks. She states it just happened a few minutes ago and it has been happening every day. She would like to know if it is normal. She states the last time the device made a noise it sounded like a [place] ambulance when the battery went low, but this is a completely different sound.     Please route to [place]

## 2020-07-02 NOTE — Telephone Encounter (Signed)
Spoke with pt regarding "purring" sound. Pt reports she hears it rather than feels it. Intermittent, "low grade sound" that sounds like "a motor running." Denies any associated symptoms, but states that it is very concerning to her as it is a new sound. Reassured pt that last ICD transmission on 05/14/20 showed normal ICD function. No alerts since that time. Explained that Medtronic ICDs only have two alert sounds, both of which beep. Pt states that this is definitely different. Advised will review manual transmission to confirm no ICD alerts or abnormalities. Attempted to assist with transmission, but received repeated error codes. Pt agrees to call [facility] tech support for troubleshooting assistance. Will check back tomorrow for update regarding monitor status. Pt in agreement with plan and denies additional questions at this time.

## 2020-07-03 ENCOUNTER — Other Ambulatory Visit: Payer: Self-pay

## 2020-07-03 ENCOUNTER — Ambulatory Visit (INDEPENDENT_AMBULATORY_CARE_PROVIDER_SITE_OTHER): Payer: Medicare Other | Admitting: Emergency Medicine

## 2020-07-03 DIAGNOSIS — Z95810 Presence of automatic (implantable) cardiac defibrillator: Secondary | ICD-10-CM

## 2020-07-03 DIAGNOSIS — I255 Ischemic cardiomyopathy: Secondary | ICD-10-CM

## 2020-07-03 DIAGNOSIS — I5022 Chronic systolic (congestive) heart failure: Secondary | ICD-10-CM | POA: Diagnosis not present

## 2020-07-03 NOTE — Telephone Encounter (Signed)
The pt states [employer] is sending a new handheld reader. They suggest that she come in to get the device interrogated. I scheduled her to come in at 10:30 am.

## 2020-07-03 NOTE — Progress Notes (Signed)
CRT-D device check in office, added-on due to patient-reported "purring" noise from ICD. No alerts noted. RA/LV thresholds, sensing, and impedances consistent with previous device measurements. RV threshold and impedance trends stable, R-waves measuring around 69mV bipolar per trend (stable), 2.76mV today, will plan to check integrated bipolar at next OV. No mode switch episodes recorded. No ventricular arrhythmia episodes recorded. CRT-P 97.9% of the time, 0.4% BiVP, 99.6% LVP. Device programmed with appropriate safety margins. Heart failure diagnostics reviewed and trends are stable for patient. Audible alerts demonstrated for patient. No changes made this session. Estimated longevity 2.2 years. Patient enrolled in remote follow up. Patient education completed including shock plan. Dr. Lovena Le spoke with patient, explained normal ICD function, encouraged patient to try to record the "purring" sound. [facility] on 08/11/20 and ROV with Dr. Lovena Le on 09/12/20.

## 2020-07-04 LAB — CUP PACEART INCLINIC DEVICE CHECK
Battery Remaining Longevity: 27 mo
Battery Voltage: 2.94 V
Brady Statistic AP VP Percent: 0.04 %
Brady Statistic AP VS Percent: 0.02 %
Brady Statistic AS VP Percent: 98.64 %
Brady Statistic AS VS Percent: 1.3 %
Brady Statistic RA Percent Paced: 0.06 %
Brady Statistic RV Percent Paced: 0.39 %
Date Time Interrogation Session: 20210826105911
HighPow Impedance: 39 Ohm
HighPow Impedance: 49 Ohm
Implantable Lead Implant Date: 20090610
Implantable Lead Implant Date: 20090610
Implantable Lead Implant Date: 20090610
Implantable Lead Location: 753858
Implantable Lead Location: 753859
Implantable Lead Location: 753860
Implantable Lead Model: 4194
Implantable Lead Model: 5076
Implantable Lead Model: 6947
Implantable Pulse Generator Implant Date: 20160323
Lead Channel Impedance Value: 247 Ohm
Lead Channel Impedance Value: 342 Ohm
Lead Channel Impedance Value: 418 Ohm
Lead Channel Impedance Value: 475 Ohm
Lead Channel Impedance Value: 475 Ohm
Lead Channel Impedance Value: 589 Ohm
Lead Channel Pacing Threshold Amplitude: 0.75 V
Lead Channel Pacing Threshold Amplitude: 1 V
Lead Channel Pacing Threshold Amplitude: 1.25 V
Lead Channel Pacing Threshold Pulse Width: 0.4 ms
Lead Channel Pacing Threshold Pulse Width: 0.4 ms
Lead Channel Pacing Threshold Pulse Width: 0.4 ms
Lead Channel Sensing Intrinsic Amplitude: 2.25 mV
Lead Channel Sensing Intrinsic Amplitude: 2.375 mV
Lead Channel Setting Pacing Amplitude: 1.75 V
Lead Channel Setting Pacing Amplitude: 2 V
Lead Channel Setting Pacing Amplitude: 2.75 V
Lead Channel Setting Pacing Pulse Width: 0.4 ms
Lead Channel Setting Pacing Pulse Width: 0.4 ms
Lead Channel Setting Sensing Sensitivity: 0.3 mV

## 2020-07-27 ENCOUNTER — Other Ambulatory Visit: Payer: Self-pay | Admitting: Interventional Cardiology

## 2020-07-28 NOTE — Telephone Encounter (Signed)
Called patient and she stated she does not want hydralazine from [facility] only [facility]. I explained she still had refills at [facility] and I would send back denied [facility]ill to [facility]. She thanked me for my call.

## 2020-08-11 ENCOUNTER — Ambulatory Visit (INDEPENDENT_AMBULATORY_CARE_PROVIDER_SITE_OTHER): Payer: Medicare Other

## 2020-08-11 DIAGNOSIS — I255 Ischemic cardiomyopathy: Secondary | ICD-10-CM

## 2020-08-11 DIAGNOSIS — I5022 Chronic systolic (congestive) heart failure: Secondary | ICD-10-CM

## 2020-08-11 LAB — CUP PACEART REMOTE DEVICE CHECK
Battery Remaining Longevity: 26 mo
Battery Voltage: 2.94 V
Brady Statistic AP VP Percent: 0.04 %
Brady Statistic AP VS Percent: 0.02 %
Brady Statistic AS VP Percent: 98.64 %
Brady Statistic AS VS Percent: 1.3 %
Brady Statistic RA Percent Paced: 0.06 %
Brady Statistic RV Percent Paced: 0.38 %
Date Time Interrogation Session: 20211004001802
HighPow Impedance: 38 Ohm
HighPow Impedance: 50 Ohm
Implantable Lead Implant Date: 20090610
Implantable Lead Implant Date: 20090610
Implantable Lead Implant Date: 20090610
Implantable Lead Location: 753858
Implantable Lead Location: 753859
Implantable Lead Location: 753860
Implantable Lead Model: 4194
Implantable Lead Model: 5076
Implantable Lead Model: 6947
Implantable Pulse Generator Implant Date: 20160323
Lead Channel Impedance Value: 285 Ohm
Lead Channel Impedance Value: 361 Ohm
Lead Channel Impedance Value: 456 Ohm
Lead Channel Impedance Value: 475 Ohm
Lead Channel Impedance Value: 532 Ohm
Lead Channel Impedance Value: 665 Ohm
Lead Channel Pacing Threshold Amplitude: 0.875 V
Lead Channel Pacing Threshold Amplitude: 0.875 V
Lead Channel Pacing Threshold Amplitude: 1.625 V
Lead Channel Pacing Threshold Pulse Width: 0.4 ms
Lead Channel Pacing Threshold Pulse Width: 0.4 ms
Lead Channel Pacing Threshold Pulse Width: 0.4 ms
Lead Channel Sensing Intrinsic Amplitude: 2.125 mV
Lead Channel Sensing Intrinsic Amplitude: 2.125 mV
Lead Channel Sensing Intrinsic Amplitude: 2.75 mV
Lead Channel Sensing Intrinsic Amplitude: 2.75 mV
Lead Channel Setting Pacing Amplitude: 1.75 V
Lead Channel Setting Pacing Amplitude: 2 V
Lead Channel Setting Pacing Amplitude: 2.75 V
Lead Channel Setting Pacing Pulse Width: 0.4 ms
Lead Channel Setting Pacing Pulse Width: 0.4 ms
Lead Channel Setting Sensing Sensitivity: 0.3 mV

## 2020-08-13 NOTE — Progress Notes (Signed)
Remote ICD transmission.   

## 2020-09-12 ENCOUNTER — Other Ambulatory Visit: Payer: Self-pay

## 2020-09-12 ENCOUNTER — Encounter: Payer: Self-pay | Admitting: Internal Medicine

## 2020-09-12 ENCOUNTER — Ambulatory Visit (INDEPENDENT_AMBULATORY_CARE_PROVIDER_SITE_OTHER): Payer: Medicare Other | Admitting: Internal Medicine

## 2020-09-12 VITALS — BP 134/80 | HR 72 | Ht 64.0 in | Wt 142.8 lb

## 2020-09-12 DIAGNOSIS — I255 Ischemic cardiomyopathy: Secondary | ICD-10-CM

## 2020-09-12 DIAGNOSIS — Z95810 Presence of automatic (implantable) cardiac defibrillator: Secondary | ICD-10-CM | POA: Diagnosis not present

## 2020-09-12 LAB — CUP PACEART INCLINIC DEVICE CHECK
Battery Remaining Longevity: 28 mo
Battery Voltage: 2.9 V
Brady Statistic AP VP Percent: 0.04 %
Brady Statistic AP VS Percent: 0.02 %
Brady Statistic AS VP Percent: 98.64 %
Brady Statistic AS VS Percent: 1.3 %
Brady Statistic RA Percent Paced: 0.06 %
Brady Statistic RV Percent Paced: 0.42 %
Date Time Interrogation Session: 20211105170328
HighPow Impedance: 36 Ohm
HighPow Impedance: 50 Ohm
Implantable Lead Implant Date: 20090610
Implantable Lead Implant Date: 20090610
Implantable Lead Implant Date: 20090610
Implantable Lead Location: 753858
Implantable Lead Location: 753859
Implantable Lead Location: 753860
Implantable Lead Model: 4194
Implantable Lead Model: 5076
Implantable Lead Model: 6947
Implantable Pulse Generator Implant Date: 20160323
Lead Channel Impedance Value: 247 Ohm
Lead Channel Impedance Value: 361 Ohm
Lead Channel Impedance Value: 475 Ohm
Lead Channel Impedance Value: 475 Ohm
Lead Channel Impedance Value: 513 Ohm
Lead Channel Impedance Value: 646 Ohm
Lead Channel Pacing Threshold Amplitude: 0.75 V
Lead Channel Pacing Threshold Amplitude: 1 V
Lead Channel Pacing Threshold Amplitude: 1.5 V
Lead Channel Pacing Threshold Pulse Width: 0.4 ms
Lead Channel Pacing Threshold Pulse Width: 0.4 ms
Lead Channel Pacing Threshold Pulse Width: 0.4 ms
Lead Channel Sensing Intrinsic Amplitude: 2 mV
Lead Channel Sensing Intrinsic Amplitude: 2.5 mV
Lead Channel Sensing Intrinsic Amplitude: 2.75 mV
Lead Channel Sensing Intrinsic Amplitude: 5.75 mV
Lead Channel Setting Pacing Amplitude: 2 V
Lead Channel Setting Pacing Amplitude: 2 V
Lead Channel Setting Pacing Amplitude: 3 V
Lead Channel Setting Pacing Pulse Width: 0.4 ms
Lead Channel Setting Pacing Pulse Width: 0.4 ms
Lead Channel Setting Sensing Sensitivity: 0.3 mV

## 2020-09-12 NOTE — Patient Instructions (Signed)
Medication Instructions:  Your physician recommends that you continue on your current medications as directed. Please refer to the Current Medication list given to you today.  Labwork: None ordered.  Testing/Procedures: None ordered.  Follow-Up: Your physician wants you to follow-up in: one year with Dr. Lovena Le.   You will receive a reminder letter in the mail two months in advance. If you don't receive a letter, please call our office to schedule the follow-up appointment.  Remote monitoring is used to monitor your ICD from home. This monitoring reduces the number of office visits required to check your device to one time per year. It allows [place] to keep an eye on the functioning of your device to ensure it is working properly. You are scheduled for a device check from home on 11/10/2020. You may send your transmission at any time that day. If you have a wireless device, the transmission will be sent automatically. After your physician reviews your transmission, you will receive a postcard with your next transmission date.  Any Other Special Instructions Will Be Listed Below (If Applicable).  If you need a refill on your cardiac medications before your next appointment, please call your pharmacy.

## 2020-09-12 NOTE — Progress Notes (Signed)
HPI [name] returns today for followup. She is a pleasant 69 yo woman with a h/o chronic systolic heart failure, an ICM, LBBB, s/p biv ICD insertion. She has had her meds changed and is now on a glucosuric agent. She has lost 18 lbs. She feels well and denies chest pain or sob.  Allergies  Allergen Reactions   Calcium Channel Blockers Other (See Comments)    Cannot take non-DHP CCBs (diltiazem, verapamil) due to CHF requiring ICD   Codeine Anaphylaxis   Lyrica [Pregabalin]     Nightmares, and swelling   Other Anaphylaxis    Walnuts, pecans, and ANY melons PATIENT IS A VEGETARIAN    Ramipril Anaphylaxis and Swelling   Ticlid [Ticlopidine Hcl] Other (See Comments)    Unprovoked bleeding while on Ticlid (doesn't think she was on ASA at the time) Takes Plavix without problems   Morphine And Related Other (See Comments)    Severe AMS, confusion, weakness Tolerates Fentanyl, Tramadol   Tape Dermatitis and Rash    Tolerates paper tape   Digoxin And Related Nausea Only    Unknown   Metolazone Other (See Comments)    Cannot remember reaction   Myrbetriq [Mirabegron] Other (See Comments)    Aggravates migraines   Onglyza [Saxagliptin] Other (See Comments)    Exacerbates migraines   Penicillins Hives    Has patient had a PCN reaction causing immediate rash, facial/tongue/throat swelling, SOB or lightheadedness with hypotension: Yes Has patient had a PCN reaction causing severe rash involving mucus membranes or skin necrosis: Unknown Has patient had a PCN reaction that required hospitalization: Unknown Has patient had a PCN reaction occurring within the last 10 years: No If all of the above answers are "NO", then may proceed with Cephalosporin use.    Spironolactone Other (See Comments)    Cannot remember reaction   Sulfa Antibiotics Hives and Other (See Comments)   Tetracyclines & Related Other (See Comments)    Cannot remember reaction Tolerates  macrolides (azithromycin)   Vicodin [Hydrocodone-Acetaminophen] Other (See Comments)    EXTREME LETHARGY   Latex Itching and Rash     Current Outpatient Medications  Medication Sig Dispense Refill   albuterol (PROVENTIL HFA;VENTOLIN HFA) 108 (90 BASE) MCG/ACT inhaler Inhale 2 puffs into the lungs every 4 (four) hours as needed for wheezing or shortness of breath.     Alcohol Swabs (B-D SINGLE USE SWABS REGULAR) PADS      ALPRAZolam (XANAX) 1 MG tablet Take 1 mg by mouth 3 (three) times daily.   2   Artificial Tear GEL Place 2 drops into both eyes daily as needed (dry eyes).      aspirin 325 MG EC tablet Take 325 mg by mouth daily.     BIOTIN PO Take by mouth daily.     cetirizine (ZYRTEC) 10 MG tablet Take 10 mg by mouth daily.     Cholecalciferol (VITAMIN D-3) 1000 UNITS CAPS Take 1,000 Units by mouth daily.      clopidogrel (PLAVIX) 75 MG tablet Take 1 tablet (75 mg total) by mouth daily. 90 tablet 3   cyclobenzaprine (FLEXERIL) 5 MG tablet Take 1 tablet (5 mg total) by mouth at bedtime as needed for muscle spasms. 30 tablet 0   docusate sodium (COLACE) 250 MG capsule Take 250 mg by mouth daily.     doxazosin (CARDURA) 4 MG tablet Take 4 mg by mouth at bedtime.      DROPLET PEN NEEDLES 32G X  4 MM MISC      EPINEPHrine 0.3 mg/0.3 mL IJ SOAJ injection Inject 0.3 mg into the muscle as needed for anaphylaxis.   3   ezetimibe (ZETIA) 10 MG tablet Take 1 tablet (10 mg total) by mouth daily. 90 tablet 3   Flaxseed, Linseed, (FLAXSEED OIL) 1200 MG CAPS Take 1,200 mg by mouth daily.     fluticasone (FLONASE) 50 MCG/ACT nasal spray Place 2 sprays into both nostrils daily as needed for allergies or rhinitis.      furosemide (LASIX) 80 MG tablet TAKE 1 TABLET TWICE A DAY. MAY TAKE AN ADDITIONAL 80MG  (1 TABLET) AS NEEDED FOR EDEMA 270 tablet 3   hydrALAZINE (APRESOLINE) 25 MG tablet Take 1 tablet (25 mg total) by mouth 2 (two) times daily. 180 tablet 3   isosorbide mononitrate  (IMDUR) 60 MG 24 hr tablet Take 1 tablet (60 mg total) by mouth daily. 90 tablet 3   Ketotifen Fumarate (ALAWAY OP) Place 1 drop into both eyes daily as needed (allergies).      LANTUS SOLOSTAR 100 UNIT/ML Solostar Pen Inject 6 Units into the skin at bedtime.      metoprolol succinate (TOPROL-XL) 100 MG 24 hr tablet TAKE 2 TABLETS DAILY. TAKE WITH OR IMMEDIATELY FOLLOWING A MEAL 180 tablet 3   Multiple Vitamin (MULTIVITAMIN WITH MINERALS) TABS Take 1 tablet by mouth daily.     nitroGLYCERIN (NITROSTAT) 0.4 MG SL tablet Place 1 tablet under the tongue every 5 minutes as needed for chest pain, max 3 doses, go to er if no relief 25 tablet 2   olmesartan (BENICAR) 40 MG tablet Take 1 tablet (40 mg total) by mouth daily. 90 tablet 3   ondansetron (ZOFRAN) 8 MG tablet Take 8 mg by mouth every 8 (eight) hours as needed for nausea or vomiting.      OZEMPIC, 0.25 OR 0.5 MG/DOSE, 2 MG/1.5ML SOPN Inject 0.25 mg into the skin once a week. Sunday     pantoprazole (PROTONIX) 40 MG tablet Take 1 tablet (40 mg total) by mouth daily. 90 tablet 3   potassium chloride (KLOR-CON) 10 MEQ tablet TAKE 2 TABLETS TWICE DAILY 360 tablet 3   simvastatin (ZOCOR) 40 MG tablet Take 1 tablet (40 mg total) by mouth at bedtime. 180 tablet 3   sodium chloride (MURO 128) 5 % ophthalmic ointment Place 1 drop into both eyes at bedtime. For dry eyes     traMADol (ULTRAM) 50 MG tablet Take 100 mg by mouth every 6 (six) hours as needed (MIGRAINES).     TRUE METRIX BLOOD GLUCOSE TEST test strip      TRUEplus Lancets 33G MISC      venlafaxine (EFFEXOR) 100 MG tablet Take 100-200 mg by mouth See admin instructions. TAKE 200 mg by mouth in the morning and take 100 mg at noon     vitamin A 7500 UNIT capsule Take 2,400 Units by mouth daily.     No current facility-administered medications for this visit.     Past Medical History:  Diagnosis Date   AICD (automatic cardioverter/defibrillator) present    Medtronic- Dr.  Beckie Salts follows   Anginal pain ([place])    Anxiety    Asthma    Back pain    "pinched nerve-lower back" - Dr. Nelva Bush follows.   Biventricular implantable cardioverter-defibrillator in situ 06/18/2011   Cerebral infarction ([place]) 11/11/2012   Cervical dysplasia    Chronic diastolic heart failure ([place]) 03/22/2016   Chronic kidney disease  Dr. Posey Pronto follows.   Chronic renal insufficiency, stage III (moderate) (HCC) 06/12/1659   Complication of anesthesia    "I wake up during surgeries" (02/14/2013)   Coronary artery disease involving coronary bypass graft of native heart with angina pectoris ([place]) 06/18/2011   Depression    DM (diabetes mellitus) ([place]) 11/11/2012   Fibroid    Function kidney decreased    GERD (gastroesophageal reflux disease)    Hemiplegia, unspecified, affecting nondominant side 11/11/2012   Hepatitis    Hepatitis A -college yrs"water source exposure"   Hiatal hernia    History of shingles    2-3 yrs ago last out break "around waist"   History of stomach ulcers    Hyperlipidemia 07/30/2016   Hypertension    ICD (implantable cardiac defibrillator) in place    Iron deficiency anemia    Ischemic cardiomyopathy    status post biventricular ICD placed by DR Edumunds who used to see Dr Melvern Banker here to establish  cardiovascular care.   Migraines    MVP (mitral valve prolapse)    Antibiotics not required for procedures   Myocardial infarction ([place])    "I've had 2; the others they were able to catch before completing" (02/14/2013)   Pacemaker    Paroxysmal SVT (supraventricular tachycardia) ([place]) 06/18/2011   Pneumonia 1950's & 1985   Shortness of breath    "lying down flat; at times w/exertion" (02/14/2013). 10-06-15 exertion only..   Stroke [facility])    "2 confirmed; 9 TIA's; results in dragging LLE; numbness in tip of tongue" (02/14/2013),10-06-15 right hand tends to be weaker when tired.   TIA (transient ischemic attack) 11/11/2012   Type II diabetes  mellitus ([place])     ROS:   All systems reviewed and negative except as noted in the HPI.   Past Surgical History:  Procedure Laterality Date   ABDOMINAL HYSTERECTOMY  1985   TAH    BIV ICD [place] CHANGE OUT  06/2007; 04/2008   "2 lead initial placement, at [facility]; done at [facility], after developing CHF" (02/14/2013)   BIV PACEMAKER GENERATOR CHANGE OUT N/A 01/29/2015   Procedure: BIV PACEMAKER GENERATOR CHANGE OUT;  Surgeon: Evans Lance, MD;  Location: [facility] CATH LAB;  Service: Cardiovascular;  Laterality: N/A;   BREAST EXCISIONAL BIOPSY Left 01/2007; 06/2007; 03/2008   "benign" (02/14/2013)   BREAST SURGERY     CARDIAC CATHETERIZATION     "probably in the teens" (02/14/2013)   CARDIAC DEFIBRILLATOR PLACEMENT     COLONOSCOPY  ~ 2002   COLONOSCOPY WITH PROPOFOL N/A 10/07/2015   Procedure: COLONOSCOPY WITH PROPOFOL;  Surgeon: Juanita Craver, MD;  Location: [facility] ENDOSCOPY;  Service: Endoscopy;  Laterality: N/A;   CORONARY ANGIOPLASTY WITH STENT PLACEMENT     "started out w/5; bypass corrected some; 1 stent since the bypass" (02/14/2013)   CORONARY ARTERY BYPASS GRAFT  ` 1998   LIMA-LAD, SVG-D1, SVG-PDA   DILATION AND CURETTAGE OF UTERUS  1975 X 2; 1976; 1977   INSERT / REPLACE / REMOVE PACEMAKER     biventricular defibrillator--06/10/ 2009   LEFT HEART CATH AND CORS/GRAFTS ANGIOGRAPHY N/A 08/04/2018   Procedure: LEFT HEART CATH AND CORS/GRAFTS ANGIOGRAPHY;  Surgeon: Burnell Blanks, MD;  Location: [place] CV LAB;  Service: Cardiovascular;  Laterality: N/A;   LEFT HEART CATHETERIZATION WITH CORONARY/GRAFT ANGIOGRAM N/A 01/03/2015   Procedure: LEFT HEART CATHETERIZATION WITH Beatrix Fetters;  Surgeon: Sinclair Grooms, MD;  Location: [facility] CATH LAB;  Service: Cardiovascular;  Laterality: N/A;   SUPRAVENTRICULAR  TACHYCARDIA ABLATION  06/2007   TEE WITHOUT CARDIOVERSION  11/14/2012   Procedure: TRANSESOPHAGEAL ECHOCARDIOGRAM (TEE);  Surgeon: Candee Furbish, MD;  Location: [facility]  ENDOSCOPY;  Service: Cardiovascular;  Laterality: N/A;     Family History  Problem Relation Age of Onset   Heart disease Mother    Hypertension Mother    Heart attack Mother    Stroke Mother    Heart disease Father    Hypertension Father    Diabetes Father    Kidney failure Father    Heart attack Father    Stroke Father    Heart disease Brother    Diabetes Brother    Kidney failure Brother    Heart attack Brother    Diabetes Paternal Grandmother      Social History   Socioeconomic History   Marital status: Married    Spouse name: [name]   Number of children: 1   Years of education: MA   Highest education level: Not on file  Occupational History    Employer: UNEMPLOYED    Comment: Disablity  Tobacco Use   Smoking status: Never Smoker   Smokeless tobacco: Never Used  Scientific laboratory technician Use: Never used  Substance and Sexual Activity   Alcohol use: No    Alcohol/week: 0.0 standard drinks   Drug use: No   Sexual activity: Yes    Birth control/protection: Surgical  Other Topics Concern   Not on file  Social History Narrative   Patient lives at home with spouse.     Daughter name is Tourist information centre manager.   Caffeine Use: none   Social Determinants of Radio broadcast assistant Strain:    Difficulty of Paying Living Expenses: Not on file  Food Insecurity:    Worried About Charity fundraiser in the Last Year: Not on file   [employer] of Food in the Last Year: Not on file  Transportation Needs:    Lack of Transportation (Medical): Not on file   Lack of Transportation (Non-Medical): Not on file  Physical Activity:    Days of Exercise per Week: Not on file   Minutes of Exercise per Session: Not on file  Stress:    Feeling of Stress : Not on file  Social Connections:    Frequency of Communication with Friends and Family: Not on file   Frequency of Social Gatherings with Friends and Family: Not on file   Attends Religious Services: Not on  file   Active Member of [place] or Organizations: Not on file   Attends Archivist Meetings: Not on file   Marital Status: Not on file  Intimate Partner Violence:    Fear of Current or Ex-Partner: Not on file   Emotionally Abused: Not on file   Physically Abused: Not on file   Sexually Abused: Not on file     BP 134/80    Pulse 72    Ht 5\' 4"  (1.626 m)    Wt 142 lb 12.8 oz (64.8 kg)    SpO2 97%    BMI 24.51 kg/m   Physical Exam:  Well appearing NAD HEENT: Unremarkable Neck:  No JVD, no thyromegally Lymphatics:  No adenopathy Back:  No CVA tenderness Lungs:  Clear with no wheezes HEART:  Regular rate rhythm, no murmurs, no rubs, no clicks Abd:  soft, positive bowel sounds, no organomegally, no rebound, no guarding Ext:  2 plus pulses, no edema, no cyanosis, no clubbing Skin:  No rashes no nodules Neuro:  CN II through XII intact, motor grossly intact   DEVICE  Normal device function.  See PaceArt for details.   Assess/Plan: 1. Chronic systolic heart failure - her symptoms are class 2. She will continue gdmt and followup in a year. 2. ICD - her medtronic Biv ICD is working normally. We will recheck in several months. 3. HTN - her bp is well controlled. No change in her meds. 4. CAD - she is s/p CABG. She denies anginal symptoms. She is encouraged to remain active.  Carleene Overlie Iqra Rotundo,MD

## 2020-09-21 ENCOUNTER — Ambulatory Visit: Payer: Medicare Other | Attending: Internal Medicine

## 2020-09-21 DIAGNOSIS — Z23 Encounter for immunization: Secondary | ICD-10-CM

## 2020-09-21 NOTE — Progress Notes (Signed)
   Covid-19 Vaccination Clinic  Name:  [name]    MRN: [id number] DOB: 1951-07-09  09/21/2020  [name] was observed post Covid-19 immunization for 30 minutes based on pre-vaccination screening without incident. She was provided with Vaccine Information Sheet and instruction to access the V-Safe system.   [name] was instructed to call 911 with any severe reactions post vaccine: [name] Difficulty breathing  . Swelling of face and throat  . A fast heartbeat  . A bad rash all over body  . Dizziness and weakness   Immunizations Administered    Name Date Dose VIS Date Route   Pfizer COVID-19 Vaccine 09/21/2020  1:34 PM 0.3 mL 08/27/2020 Intramuscular   Manufacturer: [place]   Lot: [id number]   [place]: [id number]

## 2020-10-01 ENCOUNTER — Telehealth: Payer: Self-pay | Admitting: *Deleted

## 2020-10-01 NOTE — Telephone Encounter (Signed)
  Patient Consent for Virtual Visit         [name] has provided verbal consent on 10/01/2020 for a virtual visit (video or telephone).   CONSENT FOR VIRTUAL VISIT FOR:  [name]  By participating in this virtual visit I agree to the following:  I hereby voluntarily request, consent and authorize [facility] and its employed or contracted physicians, Engineer, materials, nurse practitioners or other licensed health care professionals (the Practitioner), to provide me with telemedicine health care services (the "Services") as deemed necessary by the treating Practitioner. I acknowledge and consent to receive the Services by the Practitioner via telemedicine. I understand that the telemedicine visit will involve communicating with the Practitioner through live audiovisual communication technology and the disclosure of certain medical information by electronic transmission. I acknowledge that I have been given the opportunity to request an in-person assessment or other available alternative prior to the telemedicine visit and am voluntarily participating in the telemedicine visit.  I understand that I have the right to withhold or withdraw my consent to the use of telemedicine in the course of my care at any time, without affecting my right to future care or treatment, and that the Practitioner or I may terminate the telemedicine visit at any time. I understand that I have the right to inspect all information obtained and/or recorded in the course of the telemedicine visit and may receive copies of available information for a reasonable fee.  I understand that some of the potential risks of receiving the Services via telemedicine include:  [name] Delay or interruption in medical evaluation due to technological equipment failure or disruption; . Information transmitted may not be sufficient (e.g. poor resolution of images) to allow for appropriate medical decision making by the  Practitioner; and/or  . In rare instances, security protocols could fail, causing a breach of personal health information.  Furthermore, I acknowledge that it is my responsibility to provide information about my medical history, conditions and care that is complete and accurate to the best of my ability. I acknowledge that Practitioner's advice, recommendations, and/or decision may be based on factors not within their control, such as incomplete or inaccurate data provided by me or distortions of diagnostic images or specimens that may result from electronic transmissions. I understand that the practice of medicine is not an exact science and that Practitioner makes no warranties or guarantees regarding treatment outcomes. I acknowledge that a copy of this consent can be made available to me via my patient portal ([facility]), or I can request a printed copy by calling the office of [facility].    I understand that my insurance will be billed for this visit.   I have read or had this consent read to me. . I understand the contents of this consent, which adequately explains the benefits and risks of the Services being provided via telemedicine.  . I have been provided ample opportunity to ask questions regarding this consent and the Services and have had my questions answered to my satisfaction. . I give my informed consent for the services to be provided through the use of telemedicine in my medical care
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
## 2020-10-06 ENCOUNTER — Encounter: Payer: Self-pay | Admitting: Nurse Practitioner

## 2020-10-06 ENCOUNTER — Other Ambulatory Visit: Payer: Self-pay

## 2020-10-06 ENCOUNTER — Telehealth (INDEPENDENT_AMBULATORY_CARE_PROVIDER_SITE_OTHER): Payer: Medicare Other | Admitting: Nurse Practitioner

## 2020-10-06 VITALS — Ht 64.0 in | Wt 138.0 lb

## 2020-10-06 DIAGNOSIS — I5032 Chronic diastolic (congestive) heart failure: Secondary | ICD-10-CM | POA: Diagnosis not present

## 2020-10-06 DIAGNOSIS — I255 Ischemic cardiomyopathy: Secondary | ICD-10-CM | POA: Diagnosis not present

## 2020-10-06 DIAGNOSIS — I5022 Chronic systolic (congestive) heart failure: Secondary | ICD-10-CM

## 2020-10-06 DIAGNOSIS — I2510 Atherosclerotic heart disease of native coronary artery without angina pectoris: Secondary | ICD-10-CM

## 2020-10-06 DIAGNOSIS — I10 Essential (primary) hypertension: Secondary | ICD-10-CM | POA: Diagnosis not present

## 2020-10-06 DIAGNOSIS — N1832 Chronic kidney disease, stage 3b: Secondary | ICD-10-CM

## 2020-10-06 DIAGNOSIS — Z794 Long term (current) use of insulin: Secondary | ICD-10-CM

## 2020-10-06 DIAGNOSIS — E119 Type 2 diabetes mellitus without complications: Secondary | ICD-10-CM

## 2020-10-06 DIAGNOSIS — Z95810 Presence of automatic (implantable) cardiac defibrillator: Secondary | ICD-10-CM

## 2020-10-06 NOTE — Patient Instructions (Addendum)
After Visit Summary:  We will be checking the following labs today - NONE   Medication Instructions:    Continue with your current medicines.    If you need a refill on your cardiac medications before your next appointment, please call your pharmacy.     Testing/Procedures To Be Arranged:  N/A  Follow-Up:   See Dr. Tamala Julian in 6 months.     At [facility], you and your health needs are our priority.  As part of our continuing mission to provide you with exceptional heart care, we have created designated Provider Care Teams.  These Care Teams include your primary Cardiologist (physician) and Advanced Practice Providers (APPs -  Physician Assistants and Nurse Practitioners) who all work together to provide you with the care you need, when you need it.  Special Instructions:  . Stay safe, wash your hands for at least 20 seconds and wear a mask when needed.  . It was good to talk with you today.    Call the [place] office at [phone] if you have any questions, problems or concerns.

## 2020-11-10 ENCOUNTER — Ambulatory Visit (INDEPENDENT_AMBULATORY_CARE_PROVIDER_SITE_OTHER): Payer: Medicare Other

## 2020-11-10 DIAGNOSIS — I255 Ischemic cardiomyopathy: Secondary | ICD-10-CM

## 2020-11-11 LAB — CUP PACEART REMOTE DEVICE CHECK
Battery Remaining Longevity: 24 mo
Battery Voltage: 2.94 V
Brady Statistic AP VP Percent: 0.05 %
Brady Statistic AP VS Percent: 0.02 %
Brady Statistic AS VP Percent: 98.63 %
Brady Statistic AS VS Percent: 1.3 %
Brady Statistic RA Percent Paced: 0.07 %
Brady Statistic RV Percent Paced: 0.44 %
Date Time Interrogation Session: 20220103033524
HighPow Impedance: 38 Ohm
HighPow Impedance: 47 Ohm
Implantable Lead Implant Date: 20090610
Implantable Lead Implant Date: 20090610
Implantable Lead Implant Date: 20090610
Implantable Lead Location: 753858
Implantable Lead Location: 753859
Implantable Lead Location: 753860
Implantable Lead Model: 4194
Implantable Lead Model: 5076
Implantable Lead Model: 6947
Implantable Pulse Generator Implant Date: 20160323
Lead Channel Impedance Value: 285 Ohm
Lead Channel Impedance Value: 304 Ohm
Lead Channel Impedance Value: 418 Ohm
Lead Channel Impedance Value: 418 Ohm
Lead Channel Impedance Value: 551 Ohm
Lead Channel Impedance Value: 665 Ohm
Lead Channel Pacing Threshold Amplitude: 0.75 V
Lead Channel Pacing Threshold Amplitude: 0.875 V
Lead Channel Pacing Threshold Amplitude: 1.375 V
Lead Channel Pacing Threshold Pulse Width: 0.4 ms
Lead Channel Pacing Threshold Pulse Width: 0.4 ms
Lead Channel Pacing Threshold Pulse Width: 0.4 ms
Lead Channel Sensing Intrinsic Amplitude: 2.125 mV
Lead Channel Sensing Intrinsic Amplitude: 2.125 mV
Lead Channel Sensing Intrinsic Amplitude: 5.5 mV
Lead Channel Sensing Intrinsic Amplitude: 5.5 mV
Lead Channel Setting Pacing Amplitude: 1.75 V
Lead Channel Setting Pacing Amplitude: 2 V
Lead Channel Setting Pacing Amplitude: 3 V
Lead Channel Setting Pacing Pulse Width: 0.4 ms
Lead Channel Setting Pacing Pulse Width: 0.4 ms
Lead Channel Setting Sensing Sensitivity: 0.3 mV

## 2020-11-25 NOTE — Progress Notes (Signed)
Remote ICD transmission.   

## 2020-12-10 DIAGNOSIS — I129 Hypertensive chronic kidney disease with stage 1 through stage 4 chronic kidney disease, or unspecified chronic kidney disease: Secondary | ICD-10-CM | POA: Diagnosis not present

## 2020-12-10 DIAGNOSIS — N184 Chronic kidney disease, stage 4 (severe): Secondary | ICD-10-CM | POA: Diagnosis not present

## 2020-12-10 DIAGNOSIS — N2581 Secondary hyperparathyroidism of renal origin: Secondary | ICD-10-CM | POA: Diagnosis not present

## 2020-12-10 DIAGNOSIS — D631 Anemia in chronic kidney disease: Secondary | ICD-10-CM | POA: Diagnosis not present

## 2020-12-19 ENCOUNTER — Telehealth: Payer: Self-pay

## 2020-12-19 NOTE — Telephone Encounter (Signed)
The pt just moved and been away from the monitor for 2 days. I assured her that her monitor is working properly. She sent a transmission just to be sure that it is. I let her know that we did receive the transmission.

## 2021-01-12 ENCOUNTER — Other Ambulatory Visit: Payer: Self-pay | Admitting: Interventional Cardiology

## 2021-01-13 ENCOUNTER — Other Ambulatory Visit: Payer: Self-pay | Admitting: Interventional Cardiology

## 2021-02-09 ENCOUNTER — Ambulatory Visit (INDEPENDENT_AMBULATORY_CARE_PROVIDER_SITE_OTHER): Payer: Medicare Other

## 2021-02-09 DIAGNOSIS — I255 Ischemic cardiomyopathy: Secondary | ICD-10-CM | POA: Diagnosis not present

## 2021-02-10 LAB — CUP PACEART REMOTE DEVICE CHECK
Battery Remaining Longevity: 28 mo
Battery Voltage: 2.94 V
Brady Statistic AP VP Percent: 0.05 %
Brady Statistic AP VS Percent: 0.02 %
Brady Statistic AS VP Percent: 98.62 %
Brady Statistic AS VS Percent: 1.32 %
Brady Statistic RA Percent Paced: 0.06 %
Brady Statistic RV Percent Paced: 0.52 %
Date Time Interrogation Session: 20220404022823
HighPow Impedance: 37 Ohm
HighPow Impedance: 48 Ohm
Implantable Lead Implant Date: 20090610
Implantable Lead Implant Date: 20090610
Implantable Lead Implant Date: 20090610
Implantable Lead Location: 753858
Implantable Lead Location: 753859
Implantable Lead Location: 753860
Implantable Lead Model: 4194
Implantable Lead Model: 5076
Implantable Lead Model: 6947
Implantable Pulse Generator Implant Date: 20160323
Lead Channel Impedance Value: 247 Ohm
Lead Channel Impedance Value: 342 Ohm
Lead Channel Impedance Value: 418 Ohm
Lead Channel Impedance Value: 456 Ohm
Lead Channel Impedance Value: 513 Ohm
Lead Channel Impedance Value: 646 Ohm
Lead Channel Pacing Threshold Amplitude: 0.875 V
Lead Channel Pacing Threshold Amplitude: 0.875 V
Lead Channel Pacing Threshold Amplitude: 1.375 V
Lead Channel Pacing Threshold Pulse Width: 0.4 ms
Lead Channel Pacing Threshold Pulse Width: 0.4 ms
Lead Channel Pacing Threshold Pulse Width: 0.4 ms
Lead Channel Sensing Intrinsic Amplitude: 2 mV
Lead Channel Sensing Intrinsic Amplitude: 2 mV
Lead Channel Sensing Intrinsic Amplitude: 4.625 mV
Lead Channel Sensing Intrinsic Amplitude: 4.625 mV
Lead Channel Setting Pacing Amplitude: 1.75 V
Lead Channel Setting Pacing Amplitude: 2 V
Lead Channel Setting Pacing Amplitude: 2.75 V
Lead Channel Setting Pacing Pulse Width: 0.4 ms
Lead Channel Setting Pacing Pulse Width: 0.4 ms
Lead Channel Setting Sensing Sensitivity: 0.3 mV

## 2021-02-19 NOTE — Progress Notes (Signed)
Remote ICD transmission.   

## 2021-03-17 DIAGNOSIS — I2510 Atherosclerotic heart disease of native coronary artery without angina pectoris: Secondary | ICD-10-CM | POA: Diagnosis not present

## 2021-03-17 DIAGNOSIS — Z8673 Personal history of transient ischemic attack (TIA), and cerebral infarction without residual deficits: Secondary | ICD-10-CM | POA: Diagnosis not present

## 2021-03-17 DIAGNOSIS — N184 Chronic kidney disease, stage 4 (severe): Secondary | ICD-10-CM | POA: Diagnosis not present

## 2021-03-17 DIAGNOSIS — E119 Type 2 diabetes mellitus without complications: Secondary | ICD-10-CM | POA: Diagnosis not present

## 2021-03-17 DIAGNOSIS — Z833 Family history of diabetes mellitus: Secondary | ICD-10-CM | POA: Diagnosis not present

## 2021-03-24 ENCOUNTER — Encounter: Payer: Self-pay | Admitting: Interventional Cardiology

## 2021-03-24 ENCOUNTER — Ambulatory Visit: Payer: Medicare Other | Admitting: Interventional Cardiology

## 2021-03-24 ENCOUNTER — Other Ambulatory Visit: Payer: Self-pay

## 2021-03-24 VITALS — BP 108/76 | HR 74 | Ht 64.0 in | Wt 131.6 lb

## 2021-03-24 DIAGNOSIS — I5022 Chronic systolic (congestive) heart failure: Secondary | ICD-10-CM

## 2021-03-24 DIAGNOSIS — I10 Essential (primary) hypertension: Secondary | ICD-10-CM | POA: Diagnosis not present

## 2021-03-24 DIAGNOSIS — Z95810 Presence of automatic (implantable) cardiac defibrillator: Secondary | ICD-10-CM | POA: Diagnosis not present

## 2021-03-24 DIAGNOSIS — I2510 Atherosclerotic heart disease of native coronary artery without angina pectoris: Secondary | ICD-10-CM

## 2021-03-24 DIAGNOSIS — E119 Type 2 diabetes mellitus without complications: Secondary | ICD-10-CM

## 2021-03-24 DIAGNOSIS — Z794 Long term (current) use of insulin: Secondary | ICD-10-CM | POA: Diagnosis not present

## 2021-03-24 DIAGNOSIS — E782 Mixed hyperlipidemia: Secondary | ICD-10-CM | POA: Diagnosis not present

## 2021-03-24 DIAGNOSIS — N1832 Chronic kidney disease, stage 3b: Secondary | ICD-10-CM | POA: Diagnosis not present

## 2021-03-24 NOTE — Patient Instructions (Signed)

## 2021-03-24 NOTE — Progress Notes (Signed)
Cardiology Office Note:    Date:  03/24/2021   ID:  [name], DOB 1951/01/07, MRN [id number]  PCP:  [name], MD  Cardiologist:  Sinclair Grooms, MD   Referring MD: [name], MD   Chief Complaint  Patient presents with  . Congestive Heart Failure  . Coronary Artery Disease    History of Present Illness:    [name] is a 70 y.o. female with a hx of  CAD, CHF,BiV-IC defibrillator, ischemic cardiomyopathy, mitral valve disease with moderate regurgitation, diabetes mellitusII, ischemic stroke,and recent episode of angioedema.  ER chest pain visit 01/09/2020 lead to OP myocardial perfusion imaging and echo noted below.  Last evaluated by [name] 10/06/2020 and was stable.  She recently changed residences.  She did this for her husband who has a new role debilitating illness.  Stress was involved but she did quite well.  Has occasional episodes of chest discomfort.  No significant orthopnea or PND.  No swelling.  Uses nitroglycerin rarely.  She follows in the device clinic.  Overall she feels stable.  Past Medical History:  Diagnosis Date  . AICD (automatic cardioverter/defibrillator) present    [employer]- Dr. Beckie Salts follows  . Anginal pain ([place])   . Anxiety   . Asthma   . Back pain    "pinched nerve-lower back" - Dr. Nelva Bush follows.  . Biventricular implantable cardioverter-defibrillator in situ 06/18/2011  . Cerebral infarction ([place]) 11/11/2012  . Cervical dysplasia   . Chronic diastolic heart failure ([place]) 03/22/2016  . Chronic kidney disease    Dr. Posey Pronto follows.  . Chronic renal insufficiency, stage III (moderate) ([place]) 11/11/2012  . Complication of anesthesia    "I wake up during surgeries" (02/14/2013)  . Coronary artery disease involving coronary bypass graft of native heart with angina pectoris ([place]) 06/18/2011  . Depression   . DM (diabetes mellitus) ([place]) 11/11/2012  . Fibroid   . Function kidney decreased   . GERD  (gastroesophageal reflux disease)   . Hemiplegia, unspecified, affecting nondominant side 11/11/2012  . Hepatitis    Hepatitis A -college yrs"water source exposure"  . Hiatal hernia   . History of shingles    2-3 yrs ago last out break "around waist"  . History of stomach ulcers   . Hyperlipidemia 07/30/2016  . Hypertension   . ICD (implantable cardiac defibrillator) in place   . Iron deficiency anemia   . Ischemic cardiomyopathy    status post biventricular ICD placed by DR Edumunds who used to see Dr Melvern Banker here to establish  cardiovascular care.  . Migraines   . MVP (mitral valve prolapse)    Antibiotics not required for procedures  . Myocardial infarction ([place])    "I've had 2; the others they were able to catch before completing" (02/14/2013)  . Pacemaker   . Paroxysmal SVT (supraventricular tachycardia) ([place]) 06/18/2011  . Pneumonia 1950's & 1985  . Shortness of breath    "lying down flat; at times w/exertion" (02/14/2013). 10-06-15 exertion only..  . Stroke [facility])    "2 confirmed; 9 TIA's; results in dragging LLE; numbness in tip of tongue" (02/14/2013),10-06-15 right hand tends to be weaker when tired.  [name] TIA (transient ischemic attack) 11/11/2012  . Type II diabetes mellitus ([place])     Past Surgical History:  Procedure Laterality Date  . ABDOMINAL HYSTERECTOMY  1985   TAH   . BIV ICD GENERTAOR CHANGE OUT  06/2007; 04/2008   "2 lead initial placement, at [facility]; done  at [facility], after developing CHF" (02/14/2013)  . BIV PACEMAKER GENERATOR CHANGE OUT N/A 01/29/2015   Procedure: BIV PACEMAKER GENERATOR CHANGE OUT;  Surgeon: Evans Lance, MD;  Location: [facility] CATH LAB;  Service: Cardiovascular;  Laterality: N/A;  . BREAST EXCISIONAL BIOPSY Left 01/2007; 06/2007; 03/2008   "benign" (02/14/2013)  . BREAST SURGERY    . CARDIAC CATHETERIZATION     "probably in the teens" (02/14/2013)  . CARDIAC DEFIBRILLATOR PLACEMENT    . COLONOSCOPY  ~ 2002  . COLONOSCOPY WITH PROPOFOL N/A 10/07/2015   Procedure:  COLONOSCOPY WITH PROPOFOL;  Surgeon: Juanita Craver, MD;  Location: [facility] ENDOSCOPY;  Service: Endoscopy;  Laterality: N/A;  . CORONARY ANGIOPLASTY WITH STENT PLACEMENT     "started out w/5; bypass corrected some; 1 stent since the bypass" (02/14/2013)  . CORONARY ARTERY BYPASS GRAFT  ` 1998   LIMA-LAD, SVG-D1, SVG-PDA  . [place] OF UTERUS  1975 X 2; 1976; 1977  . INSERT / REPLACE / REMOVE PACEMAKER     biventricular defibrillator--06/10/ 2009  . LEFT HEART CATH AND CORS/GRAFTS ANGIOGRAPHY N/A 08/04/2018   Procedure: LEFT HEART CATH AND CORS/GRAFTS ANGIOGRAPHY;  Surgeon: Burnell Blanks, MD;  Location: [facility] CV LAB;  Service: Cardiovascular;  Laterality: N/A;  . LEFT HEART CATHETERIZATION WITH CORONARY/GRAFT ANGIOGRAM N/A 01/03/2015   Procedure: LEFT HEART CATHETERIZATION WITH Beatrix Fetters;  Surgeon: Sinclair Grooms, MD;  Location[facility] C[facility] LAB;  Service: Cardiovascular;  Laterality: N/A;  . SUPRAVENTRICULAR TACHYCARDIA ABLATION  06/2007  . TEE WITHOUT CARDIOVERSION  11/14/2012   Procedure: TRANSESOPHAGEAL ECHOCARDIOGRAM (TEE);  Surgeon: Candee Furbish, MD;  Location: [facility] ENDOSCOPY;  Service: Cardiovascular;  Laterality: N/A;    Current Medications: Current Meds  Medication Sig  . albuterol (PROVENTIL HFA;VENTOLIN HFA) 108 (90 BASE) MCG/ACT inhaler Inhale 2 puffs into the lungs every 4 (four) hours as needed for wheezing or shortness of breath.  . Alcohol Swabs (B-D SINGLE USE SWABS REGULAR) PADS   . ALPRAZolam (XANAX) 1 MG tablet Take 1 mg by mouth 3 (three) times daily.   . Artificial Tear GEL Place 2 drops into both eyes daily as needed (dry eyes).   [name] aspirin 325 MG EC tablet Take 325 mg by mouth daily.  [name] BIOTIN PO Take by mouth daily.  . cetirizine (ZYRTEC) 10 MG tablet Take 10 mg by mouth daily.  . Cholecalciferol (VITAMIN D-3) 1000 UNITS CAPS Take 1,000 Units by mouth daily.   . clopidogrel (PLAVIX) 75 MG tablet TAKE 1 TABLET BY MOUTH  DAILY  .  cyclobenzaprine (FLEXERIL) 5 MG tablet Take 1 tablet (5 mg total) by mouth at bedtime as needed for muscle spasms.  [name] docusate sodium (COLACE) 250 MG capsule Take 250 mg by mouth daily.  [name] doxazosin (CARDURA) 4 MG tablet Take 4 mg by mouth at bedtime.   . DROPLET PEN NEEDLES 32G X 4 MM MISC   . EPINEPHrine 0.3 mg/0.3 mL IJ SOAJ injection Inject 0.3 mg into the muscle as needed for anaphylaxis.   [name] ezetimibe (ZETIA) 10 MG tablet TAKE 1 TABLET BY MOUTH  DAILY  . Flaxseed, Linseed, (FLAXSEED OIL) 1200 MG CAPS Take 1,200 mg by mouth daily.  . fluticasone (FLONASE) 50 MCG/ACT nasal spray Place 2 sprays into both nostrils daily as needed for allergies or rhinitis.  . furosemide (LASIX) 80 MG tablet TAKE 1 TABLET TWICE A DAY. MAY TAKE AN ADDITIONAL 80MG  (1 TABLET) AS NEEDED FOR EDEMA  . hydrALAZINE (APRESOLINE) 25 MG tablet TAKE  1 TABLET BY MOUTH  TWICE DAILY  . isosorbide mononitrate (IMDUR) 60 MG 24 hr tablet TAKE 1 TABLET BY MOUTH  DAILY  . Ketotifen Fumarate (ALAWAY OP) Place 1 drop into both eyes daily as needed (allergies).   [name] LANTUS SOLOSTAR 100 UNIT/ML Solostar Pen Inject 6 Units into the skin at bedtime.   . metoprolol succinate (TOPROL-XL) 100 MG 24 hr tablet TAKE 2 TABLETS BY MOUTH  DAILY TAKE WITH OR  IMMEDIATELY FOLLOWING A  MEAL  . Multiple Vitamin (MULTIVITAMIN WITH MINERALS) TABS Take 1 tablet by mouth daily.  . nitroGLYCERIN (NITROSTAT) 0.4 MG SL tablet Place 1 tablet under the tongue every 5 minutes as needed for chest pain, max 3 doses, go to er if no relief  . olmesartan (BENICAR) 40 MG tablet TAKE 1 TABLET BY MOUTH  DAILY  . ondansetron (ZOFRAN) 8 MG tablet Take 8 mg by mouth every 8 (eight) hours as needed for nausea or vomiting.   [name] OZEMPIC, 0.25 OR 0.5 MG/DOSE, 2 MG/1.5ML SOPN Inject 0.25 mg into the skin once a week. Sunday  . pantoprazole (PROTONIX) 40 MG tablet TAKE 1 TABLET BY MOUTH  DAILY  . potassium chloride (KLOR-CON) 10 MEQ tablet TAKE 2 TABLETS BY MOUTH  TWICE DAILY   . simvastatin (ZOCOR) 40 MG tablet Take 1 tablet (40 mg total) by mouth at bedtime.  . sodium chloride (MURO 128) 5 % ophthalmic ointment Place 1 drop into both eyes at bedtime. For dry eyes  . traMADol (ULTRAM) 50 MG tablet Take 100 mg by mouth every 6 (six) hours as needed (MIGRAINES).  . TRUE METRIX BLOOD GLUCOSE TEST test strip   . TRUEplus Lancets 33G MISC   . venlafaxine (EFFEXOR) 100 MG tablet Take 100-200 mg by mouth See admin instructions. TAKE 200 mg by mouth in the morning and take 100 mg at noon  . vitamin A 7500 UNIT capsule Take 2,400 Units by mouth daily.     Allergies:   Calcium channel blockers, Codeine, Lyrica [pregabalin], Other, Ramipril, Ticlid [ticlopidine hcl], Morphine and related, Tape, Digoxin, Digoxin and related, Diltiazem hcl, Diltiazem hcl er beads, Metformin hcl, Metolazone, Milk-related compounds, Morphine sulfate, Myrbetriq [mirabegron], Nsaids, Onglyza [saxagliptin], Penicillin v, Penicillins, Spironolactone, Sulfa antibiotics, Tetracycline hcl, Tetracyclines & related, Ticlopidine, Verapamil, Vicodin [hydrocodone-acetaminophen], and Latex   Social History   Socioeconomic History  . Marital status: Married    Spouse name: [name]  . Number of children: 1  . Years of education: MA  . Highest education level: Not on file  Occupational History    Employer: UNEMPLOYED    Comment: Disablity  Tobacco Use  . Smoking status: Never Smoker  . Smokeless tobacco: Never Used  Vaping Use  . Vaping Use: Never used  Substance and Sexual Activity  . Alcohol use: No    Alcohol/week: 0.0 standard drinks  . Drug use: No  . Sexual activity: Yes    Birth control/protection: Surgical  Other Topics Concern  . Not on file  Social History Narrative   Patient lives at home with spouse.     Daughter name is Tourist information centre manager.   Caffeine Use: none   Social Determinants of Radio broadcast assistant Strain: Not on file  Food Insecurity: Not on file  Transportation Needs: Not on  file  Physical Activity: Not on file  Stress: Not on file  Social Connections: Not on file     Family History: The patient's family history includes Diabetes in her brother, father, and paternal grandmother;  Heart attack in her brother, father, and mother; Heart disease in her brother, father, and mother; Hypertension in her father and mother; Kidney failure in her brother and father; Stroke in her father and mother.  ROS:   Please see the history of present illness.    Denies syncope.  No AICD treatment.  Appetite is good.  Occasional wheezing.  This is mostly in pollen season.  All other systems reviewed and are negative.  EKGs/Labs/Other Studies Reviewed:    The following studies were reviewed today:  01/2020 Nuclear Stress Test: Study Highlights   Nuclear stress EF is calculated at 47% but visually appears higher.  The left ventricular ejection fraction is mildly decreased (45-54%).  There was no ST segment deviation noted during stress.  The perfusion study is normal.  This is a low risk study.  Recommend 2D echo to assess LVF.  ECHOCARDIOGRAM 01/2020: IMPRESSIONS    1. Mild global hypokinesis. Left ventricular ejection fraction, by  estimation, is 50 to 55%. The left ventricle has low normal function. The  left ventricle demonstrates global hypokinesis. Left ventricular diastolic  parameters are consistent with Grade  II diastolic dysfunction (pseudonormalization). The average left  ventricular global longitudinal strain is -15.9 %.  2. Right ventricular systolic function is mildly reduced. The right  ventricular size is normal. There is normal pulmonary artery systolic  pressure.  3. Left atrial size was mildly dilated.  4. Right atrial size was mildly dilated.  5. The mitral valve is normal in structure. Trivial mitral valve  regurgitation. No evidence of mitral stenosis.  6. Tricuspid valve regurgitation is mild to moderate.  7. The aortic valve is  tricuspid. Aortic valve regurgitation is not  visualized. No aortic stenosis is present.  8. Aortic dilatation noted. There is mild dilatation of the ascending  aorta measuring 37 mm.  9. The inferior vena cava is normal in size with greater than 50%  respiratory variability, suggesting right atrial pressure of 3 mmHg.   EKG:  EKG normal sinus rhythm, right bundle branch block, left axis deviation, V pacing, when compared to the prior tracing from March 2021, and changes noted.  Recent Labs: No results found for requested labs within last 8760 hours.  Recent Lipid Panel    Component Value Date/Time   CHOL 179 04/06/2019 1510   TRIG 129 04/06/2019 1510   HDL 61 04/06/2019 1510   CHOLHDL 2.9 04/06/2019 1510   CHOLHDL 3.2 01/02/2016 0730   VLDL 26 01/02/2016 0730   LDLCALC 92 04/06/2019 1510    Physical Exam:    VS:  BP 108/76   Pulse 74   Ht 5\' 4"  (1.626 m)   Wt 131 lb 9.6 oz (59.7 kg)   BMI 22.59 kg/m     Wt Readings from Last 3 Encounters:  03/24/21 131 lb 9.6 oz (59.7 kg)  10/06/20 138 lb (62.6 kg)  09/12/20 142 lb 12.8 oz (64.8 kg)     GEN: Slender. No acute distress HEENT: Normal NECK: No JVD. LYMPHATICS: No lymphadenopathy CARDIAC: No murmur. RRR no gallop, or edema. VASCULAR:  Normal Pulses. No bruits. RESPIRATORY:  Clear to auscultation without rales, wheezing or rhonchi  ABDOMEN: Soft, non-tender, non-distended, No pulsatile mass, MUSCULOSKELETAL: No deformity  SKIN: Warm and dry NEUROLOGIC:  Alert and oriented x 3 PSYCHIATRIC:  Normal affect   ASSESSMENT:    1. Coronary artery disease involving native coronary artery of native heart without angina pectoris   2. Chronic systolic heart failure ([place])  3. Essential hypertension   4. Type 2 diabetes mellitus without complication, with long-term current use of insulin (HCC)   5. Stage 3b chronic kidney disease ([place])   6. Biventricular implantable cardioverter-defibrillator in situ   7. Mixed  hyperlipidemia    PLAN:    In order of problems listed above:  1. Secondary prevention discussed 2. She has stage IV CKD and is therefore not a candidate for SGLT2 therapy.   3. Excellent current blood pressure control. 4. No comment 5. CKD stage IV based upon recent creatinine of 2.74. 6. Continue to follow in device clinic 7. Continue high intensity statin therapy.  Overall education and awareness concerning primary/secondary risk prevention was discussed in detail: LDL less than 70, hemoglobin A1c less than 7, blood pressure target less than 130/80 mmHg, >150 minutes of moderate aerobic activity per week, avoidance of smoking, weight control (via diet and exercise), and continued surveillance/management of/for obstructive sleep apnea.  Follow-up in 6 months.  Call if short of breath, swelling, angina, or arrhythmia.   Medication Adjustments/Labs and Tests Ordered: Current medicines are reviewed at length with the patient today.  Concerns regarding medicines are outlined above.  Orders Placed This Encounter  Procedures  . EKG 12-Lead   No orders of the defined types were placed in this encounter.   Patient Instructions  Medication Instructions:  Your physician recommends that you continue on your current medications as directed. Please refer to the Current Medication list given to you today.  *If you need a refill on your cardiac medications before your next appointment, please call your pharmacy*   Lab Work: None If you have labs (blood work) drawn today and your tests are completely normal, you will receive your results only by: [name] MyChart Message (if you have MyChart) OR . A paper copy in the mail If you have any lab test that is abnormal or we need to change your treatment, we will call you to review the results.   Testing/Procedures: None   Follow-Up: At [facility], you and your health needs are our priority.  As part of our continuing mission to provide you  with exceptional heart care, we have created designated Provider Care Teams.  These Care Teams include your primary Cardiologist (physician) and Advanced Practice Providers (APPs -  Physician Assistants and Nurse Practitioners) who all work together to provide you with the care you need, when you need it.  We recommend signing up for the patient portal called "MyChart".  Sign up information is provided on this After Visit Summary.  MyChart is used to connect with patients for Virtual Visits (Telemedicine).  Patients are able to view lab/test results, encounter notes, upcoming appointments, etc.  Non-urgent messages can be sent to your provider as well.   To learn more about what you can do with MyChart, go to [email or web address].    Your next appointment:   6 month(s)  The format for your next appointment:   In Person  Provider:   You may see Sinclair Grooms, MD or one of the following Advanced Practice Providers on your designated Care Team:    Kathyrn Drown, NP    Other Instructions      Signed, Sinclair Grooms, MD  03/24/2021 4:04 PM    [place]

## 2021-04-23 ENCOUNTER — Other Ambulatory Visit: Payer: Self-pay | Admitting: Interventional Cardiology

## 2021-04-24 ENCOUNTER — Telehealth: Payer: Medicare Other | Admitting: Physician Assistant

## 2021-04-24 DIAGNOSIS — U071 COVID-19: Secondary | ICD-10-CM | POA: Diagnosis not present

## 2021-04-24 MED ORDER — MOLNUPIRAVIR EUA 200MG CAPSULE: 4.0000 | ORAL_CAPSULE | Freq: Two times a day (BID) | ORAL | 0 refills | Status: AC

## 2021-04-24 MED ORDER — BENZONATATE 100 MG PO CAPS: 100.0000 mg | ORAL_CAPSULE | Freq: Three times a day (TID) | ORAL | 0 refills | Status: DC | PRN

## 2021-04-24 NOTE — Patient Instructions (Signed)
[name], thank you for joining [name], PA-C for today's virtual visit.  While this provider is not your primary care provider (PCP), if your PCP is located in our provider database this encounter information will be shared with them immediately following your visit.  Consent: (Patient) [name] provided verbal consent for this virtual visit at the beginning of the encounter.  Current Medications:  Current Outpatient Medications:    albuterol (PROVENTIL HFA;VENTOLIN HFA) 108 (90 BASE) MCG/ACT inhaler, Inhale 2 puffs into the lungs every 4 (four) hours as needed for wheezing or shortness of breath., Disp: , Rfl:    Alcohol Swabs (B-D SINGLE USE SWABS REGULAR) PADS, , Disp: , Rfl:    ALPRAZolam (XANAX) 1 MG tablet, Take 1 mg by mouth 3 (three) times daily. , Disp: , Rfl: 2   Artificial Tear GEL, Place 2 drops into both eyes daily as needed (dry eyes). , Disp: , Rfl:    aspirin 325 MG EC tablet, Take 325 mg by mouth daily., Disp: , Rfl:    BIOTIN PO, Take by mouth daily., Disp: , Rfl:    cetirizine (ZYRTEC) 10 MG tablet, Take 10 mg by mouth daily., Disp: , Rfl:    Cholecalciferol (VITAMIN D-3) 1000 UNITS CAPS, Take 1,000 Units by mouth daily. , Disp: , Rfl:    clopidogrel (PLAVIX) 75 MG tablet, TAKE 1 TABLET BY MOUTH  DAILY, Disp: 90 tablet, Rfl: 2   cyclobenzaprine (FLEXERIL) 5 MG tablet, Take 1 tablet (5 mg total) by mouth at bedtime as needed for muscle spasms., Disp: 30 tablet, Rfl: 0   docusate sodium (COLACE) 250 MG capsule, Take 250 mg by mouth daily., Disp: , Rfl:    doxazosin (CARDURA) 4 MG tablet, Take 4 mg by mouth at bedtime. , Disp: , Rfl:    DROPLET PEN NEEDLES 32G X 4 MM MISC, , Disp: , Rfl:    EPINEPHrine 0.3 mg/0.3 mL IJ SOAJ injection, Inject 0.3 mg into the muscle as needed for anaphylaxis. , Disp: , Rfl: 3   ezetimibe (ZETIA) 10 MG tablet, TAKE 1 TABLET BY MOUTH  DAILY, Disp: 90 tablet, Rfl: 2   Flaxseed, Linseed, (FLAXSEED OIL) 1200 MG  CAPS, Take 1,200 mg by mouth daily., Disp: , Rfl:    fluticasone (FLONASE) 50 MCG/ACT nasal spray, Place 2 sprays into both nostrils daily as needed for allergies or rhinitis., Disp: , Rfl:    furosemide (LASIX) 80 MG tablet, TAKE 1 TABLET TWICE A DAY. MAY TAKE AN ADDITIONAL 80MG  (1 TABLET) AS NEEDED FOR EDEMA, Disp: 270 tablet, Rfl: 3   hydrALAZINE (APRESOLINE) 25 MG tablet, TAKE 1 TABLET BY MOUTH  TWICE DAILY, Disp: 180 tablet, Rfl: 2   isosorbide mononitrate (IMDUR) 60 MG 24 hr tablet, TAKE 1 TABLET BY MOUTH  DAILY, Disp: 90 tablet, Rfl: 2   Ketotifen Fumarate (ALAWAY OP), Place 1 drop into both eyes daily as needed (allergies). , Disp: , Rfl:    LANTUS SOLOSTAR 100 UNIT/ML Solostar Pen, Inject 6 Units into the skin at bedtime. , Disp: , Rfl:    metoprolol succinate (TOPROL-XL) 100 MG 24 hr tablet, TAKE 2 TABLETS BY MOUTH  DAILY TAKE WITH OR  IMMEDIATELY FOLLOWING A  MEAL, Disp: 180 tablet, Rfl: 2   Multiple Vitamin (MULTIVITAMIN WITH MINERALS) TABS, Take 1 tablet by mouth daily., Disp: , Rfl:    nitroGLYCERIN (NITROSTAT) 0.4 MG SL tablet, Place 1 tablet under the tongue every 5 minutes as needed for chest pain, max  3 doses, go to er if no relief, Disp: 25 tablet, Rfl: 2   olmesartan (BENICAR) 40 MG tablet, TAKE 1 TABLET BY MOUTH  DAILY, Disp: 90 tablet, Rfl: 2   ondansetron (ZOFRAN) 8 MG tablet, Take 8 mg by mouth every 8 (eight) hours as needed for nausea or vomiting. , Disp: , Rfl:    OZEMPIC, 0.25 OR 0.5 MG/DOSE, 2 MG/1.5ML SOPN, Inject 0.25 mg into the skin once a week. Sunday, Disp: , Rfl:    pantoprazole (PROTONIX) 40 MG tablet, TAKE 1 TABLET BY MOUTH  DAILY, Disp: 90 tablet, Rfl: 2   potassium chloride (KLOR-CON) 10 MEQ tablet, TAKE 2 TABLETS BY MOUTH  TWICE DAILY, Disp: 360 tablet, Rfl: 2   simvastatin (ZOCOR) 40 MG tablet, TAKE 1 TABLET BY MOUTH AT  BEDTIME, Disp: 90 tablet, Rfl: 3   sodium chloride (MURO 128) 5 % ophthalmic ointment, Place 1 drop into both eyes at bedtime. For dry eyes,  Disp: , Rfl:    traMADol (ULTRAM) 50 MG tablet, Take 100 mg by mouth every 6 (six) hours as needed (MIGRAINES)., Disp: , Rfl:    TRUE METRIX BLOOD GLUCOSE TEST test strip, , Disp: , Rfl:    TRUEplus Lancets 33G MISC, , Disp: , Rfl:    venlafaxine (EFFEXOR) 100 MG tablet, Take 100-200 mg by mouth See admin instructions. TAKE 200 mg by mouth in the morning and take 100 mg at noon, Disp: , Rfl:    vitamin A 7500 UNIT capsule, Take 2,400 Units by mouth daily., Disp: , Rfl:    Medications ordered in this encounter:  No orders of the defined types were placed in this encounter.    *If you need refills on other medications prior to your next appointment, please contact your pharmacy*  Follow-Up: Call back or seek an in-person evaluation if the symptoms worsen or if the condition fails to improve as anticipated.  Other Instructions Please keep well-hydrated and get plenty of rest. Continue your daily medications and vitamins. You can start OTC plain Mucinex to help with any congestion. I sent in a prescription cough medicine for you to take as directed. Take antiviral medication as directed. You can use your albuterol inhaler as directed if needed for any developing chest tightness.  I have enrolled you in a symptom monitoring program through MyChart. Please fill out these questionnaires daily. Please follow the quarantine recommendations at given at time of visit.  If you note any acutely worsening symptoms, any significant shortness of breath or any chest pain, you need evaluation at nearest emergency department ASAP.  Do not delay care!   If you have been instructed to have an in-person evaluation today at a local Urgent Care facility, please use the link below. It will take you to a list of all of our available [facility], including address, phone number and hours of operation. Please do not delay care.  [facility]  If you or a family member do not have a  primary care provider, use the link below to schedule a visit and establish care. When you choose a [facility] primary care physician or advanced practice provider, you gain a long-term partner in health. Find a Primary Care Provider  Learn more about 's in-office and virtual care options: [facility]

## 2021-04-24 NOTE — Progress Notes (Signed)
Virtual Visit Consent   [name], you are scheduled for a virtual visit with a [place] provider today.     Just as with appointments in the office, your consent must be obtained to participate.  Your consent will be active for this visit and any virtual visit you may have with one of our providers in the next 365 days.     If you have a MyChart account, a copy of this consent can be sent to you electronically.  All virtual visits are billed to your insurance company just like a traditional visit in the office.    As this is a virtual visit, video technology does not allow for your provider to perform a traditional examination.  This may limit your provider's ability to fully assess your condition.  If your provider identifies any concerns that need to be evaluated in person or the need to arrange testing (such as labs, EKG, etc.), we will make arrangements to do so.     Although advances in technology are sophisticated, we cannot ensure that it will always work on either your end or our end.  If the connection with a video visit is poor, the visit may have to be switched to a telephone visit.  With either a video or telephone visit, we are not always able to ensure that we have a secure connection.     I need to obtain your verbal consent now.   Are you willing to proceed with your visit today?    [name] has provided verbal consent on 04/24/2021 for a virtual visit (video or telephone).   [name], [place]   Date: 04/24/2021 2:11 PM   Virtual Visit via Video Note   I, Emily Rio, PA-C, attempted to connect with [name]; MRN [id number] on 04/24/21 via [place] to complete a video urgent care visit. The patient was unable to successfully connect to the video platform. As such, the patient was contacted by this provider via phone to complete the encounter.   Location: Patient: Virtual Visit Location Patient: Home Provider: Virtual  Visit Location Provider: Home Office   I discussed the limitations of evaluation and management by telemedicine and the availability of in person appointments. The patient expressed understanding and agreed to proceed.    History of Present Illness: [name] is a 70 y.o. who identifies as a female who was assigned female at birth, and is being seen today for positive COVID-19 test.  Patient endorses that her daughter tested positive for COVID Wednesday of this week.  She states she has been around her daughter for couple days.  Notes that yesterday she started with rhinorrhea and sore throat. Also started with coughing spells. Took a COVID test this AM and was positive. Denies chills, aches. Denies fever. Cough is mainly dry overall but occasional will get some clear phlegm up. Denies chest pain or true despite asthma. Some mild headache but no sinus pain.     Problems:  Patient Active Problem List   Diagnosis Date Noted   Chest pain 08/02/2018   Angina of effort ([place])    Hyperlipidemia 07/30/2016   Chronic diastolic heart failure ([place]) 03/22/2016   Ischemic cardiomyopathy 01/28/2015   Mastodynia, female 05/14/2014   Cerebral infarction ([place]) 11/11/2012   HTN (hypertension) 11/11/2012   DM (diabetes mellitus) ([place]) 11/11/2012   Chronic renal insufficiency, stage III (moderate) ([place]) 11/11/2012   TIA (transient ischemic attack) 11/11/2012   History of  shingles    MVP (mitral valve prolapse)    Asthma    Anxiety    Depression    Hiatal hernia    Cervical dysplasia    Fibroid    Biventricular implantable cardioverter-defibrillator in situ 06/18/2011   Paroxysmal SVT (supraventricular tachycardia) ([place]) 06/18/2011   Coronary artery disease involving coronary bypass graft of native heart with angina pectoris ([place]) 06/18/2011    Allergies:  Allergies  Allergen Reactions   Calcium Channel Blockers Other (See Comments)    Cannot take non-DHP CCBs (diltiazem, verapamil) due  to CHF requiring ICD   Codeine Anaphylaxis and Other (See Comments)   Lyrica [Pregabalin]     Nightmares, and swelling   Other Anaphylaxis and Other (See Comments)    Walnuts, pecans, and ANY melons PATIENT IS A VEGETARIAN    Ramipril Anaphylaxis, Swelling and Other (See Comments)   Ticlid [Ticlopidine Hcl] Other (See Comments)    Unprovoked bleeding while on Ticlid (doesn't think she was on ASA at the time) Takes Plavix without problems   Morphine And Related Other (See Comments)    Severe AMS, confusion, weakness Tolerates Fentanyl, Tramadol   Tape Dermatitis and Rash    Tolerates paper tape   Digoxin Other (See Comments)   Digoxin And Related Nausea Only    Unknown   Diltiazem Hcl Other (See Comments)   Diltiazem Hcl Er Beads Other (See Comments)   Metformin Hcl Other (See Comments)   Metolazone Other (See Comments)    Cannot remember reaction   Milk-Related Compounds Other (See Comments)    Bad gas   Morphine Sulfate Other (See Comments)   Myrbetriq [Mirabegron] Other (See Comments)    Aggravates migraines   Nsaids Other (See Comments)   Onglyza [Saxagliptin] Other (See Comments)    Exacerbates migraines   Penicillin V Other (See Comments)   Penicillins Hives    Has patient had a PCN reaction causing immediate rash, facial/tongue/throat swelling, SOB or lightheadedness with hypotension: Yes Has patient had a PCN reaction causing severe rash involving mucus membranes or skin necrosis: Unknown Has patient had a PCN reaction that required hospitalization: Unknown Has patient had a PCN reaction occurring within the last 10 years: No If all of the above answers are "NO", then may proceed with Cephalosporin use.    Spironolactone Other (See Comments)    Cannot remember reaction   Sulfa Antibiotics Hives and Other (See Comments)   Tetracycline Hcl Other (See Comments)   Tetracyclines & Related Other (See Comments)    Cannot remember reaction Tolerates macrolides  (azithromycin)   Ticlopidine Other (See Comments)   Verapamil Other (See Comments)    Pacemaker/CHF   Vicodin [Hydrocodone-Acetaminophen] Other (See Comments)    EXTREME LETHARGY   Latex Itching, Rash and Other (See Comments)   Medications:  Current Outpatient Medications:    benzonatate (TESSALON) 100 MG capsule, Take 1 capsule (100 mg total) by mouth 3 (three) times daily as needed for cough., Disp: 30 capsule, Rfl: 0   molnupiravir EUA 200 mg CAPS, Take 4 capsules (800 mg total) by mouth 2 (two) times daily for 5 days., Disp: 40 capsule, Rfl: 0   albuterol (PROVENTIL HFA;VENTOLIN HFA) 108 (90 BASE) MCG/ACT inhaler, Inhale 2 puffs into the lungs every 4 (four) hours as needed for wheezing or shortness of breath., Disp: , Rfl:    Alcohol Swabs (B-D SINGLE USE SWABS REGULAR) PADS, , Disp: , Rfl:    ALPRAZolam (XANAX) 1 MG tablet, Take 1 mg by mouth  3 (three) times daily. , Disp: , Rfl: 2   Artificial Tear GEL, Place 2 drops into both eyes daily as needed (dry eyes). , Disp: , Rfl:    aspirin 325 MG EC tablet, Take 325 mg by mouth daily., Disp: , Rfl:    BIOTIN PO, Take by mouth daily., Disp: , Rfl:    cetirizine (ZYRTEC) 10 MG tablet, Take 10 mg by mouth daily., Disp: , Rfl:    Cholecalciferol (VITAMIN D-3) 1000 UNITS CAPS, Take 1,000 Units by mouth daily. , Disp: , Rfl:    clopidogrel (PLAVIX) 75 MG tablet, TAKE 1 TABLET BY MOUTH  DAILY, Disp: 90 tablet, Rfl: 2   cyclobenzaprine (FLEXERIL) 5 MG tablet, Take 1 tablet (5 mg total) by mouth at bedtime as needed for muscle spasms., Disp: 30 tablet, Rfl: 0   docusate sodium (COLACE) 250 MG capsule, Take 250 mg by mouth daily., Disp: , Rfl:    doxazosin (CARDURA) 4 MG tablet, Take 4 mg by mouth at bedtime. , Disp: , Rfl:    DROPLET PEN NEEDLES 32G X 4 MM MISC, , Disp: , Rfl:    EPINEPHrine 0.3 mg/0.3 mL IJ SOAJ injection, Inject 0.3 mg into the muscle as needed for anaphylaxis. , Disp: , Rfl: 3   ezetimibe (ZETIA) 10 MG tablet, TAKE 1 TABLET BY  MOUTH  DAILY, Disp: 90 tablet, Rfl: 2   Flaxseed, Linseed, (FLAXSEED OIL) 1200 MG CAPS, Take 1,200 mg by mouth daily., Disp: , Rfl:    fluticasone (FLONASE) 50 MCG/ACT nasal spray, Place 2 sprays into both nostrils daily as needed for allergies or rhinitis., Disp: , Rfl:    furosemide (LASIX) 80 MG tablet, TAKE 1 TABLET TWICE A DAY. MAY TAKE AN ADDITIONAL 80MG  (1 TABLET) AS NEEDED FOR EDEMA, Disp: 270 tablet, Rfl: 3   hydrALAZINE (APRESOLINE) 25 MG tablet, TAKE 1 TABLET BY MOUTH  TWICE DAILY, Disp: 180 tablet, Rfl: 2   isosorbide mononitrate (IMDUR) 60 MG 24 hr tablet, TAKE 1 TABLET BY MOUTH  DAILY, Disp: 90 tablet, Rfl: 2   Ketotifen Fumarate (ALAWAY OP), Place 1 drop into both eyes daily as needed (allergies). , Disp: , Rfl:    LANTUS SOLOSTAR 100 UNIT/ML Solostar Pen, Inject 6 Units into the skin at bedtime. , Disp: , Rfl:    metoprolol succinate (TOPROL-XL) 100 MG 24 hr tablet, TAKE 2 TABLETS BY MOUTH  DAILY TAKE WITH OR  IMMEDIATELY FOLLOWING A  MEAL, Disp: 180 tablet, Rfl: 2   Multiple Vitamin (MULTIVITAMIN WITH MINERALS) TABS, Take 1 tablet by mouth daily., Disp: , Rfl:    nitroGLYCERIN (NITROSTAT) 0.4 MG SL tablet, Place 1 tablet under the tongue every 5 minutes as needed for chest pain, max 3 doses, go to er if no relief, Disp: 25 tablet, Rfl: 2   olmesartan (BENICAR) 40 MG tablet, TAKE 1 TABLET BY MOUTH  DAILY, Disp: 90 tablet, Rfl: 2   ondansetron (ZOFRAN) 8 MG tablet, Take 8 mg by mouth every 8 (eight) hours as needed for nausea or vomiting. , Disp: , Rfl:    OZEMPIC, 0.25 OR 0.5 MG/DOSE, 2 MG/1.5ML SOPN, Inject 0.25 mg into the skin once a week. Sunday, Disp: , Rfl:    pantoprazole (PROTONIX) 40 MG tablet, TAKE 1 TABLET BY MOUTH  DAILY, Disp: 90 tablet, Rfl: 2   potassium chloride (KLOR-CON) 10 MEQ tablet, TAKE 2 TABLETS BY MOUTH  TWICE DAILY, Disp: 360 tablet, Rfl: 2   simvastatin (ZOCOR) 40 MG tablet, TAKE 1 TABLET BY  MOUTH AT  BEDTIME, Disp: 90 tablet, Rfl: 3   sodium chloride (MURO  128) 5 % ophthalmic ointment, Place 1 drop into both eyes at bedtime. For dry eyes, Disp: , Rfl:    traMADol (ULTRAM) 50 MG tablet, Take 100 mg by mouth every 6 (six) hours as needed (MIGRAINES)., Disp: , Rfl:    TRUE METRIX BLOOD GLUCOSE TEST test strip, , Disp: , Rfl:    TRUEplus Lancets 33G MISC, , Disp: , Rfl:    venlafaxine (EFFEXOR) 100 MG tablet, Take 100-200 mg by mouth See admin instructions. TAKE 200 mg by mouth in the morning and take 100 mg at noon, Disp: , Rfl:    vitamin A 7500 UNIT capsule, Take 2,400 Units by mouth daily., Disp: , Rfl:   Observations/Objective: Patient is in no acute distress.  Resting comfortably at home.  No labored breathing.  Speech is clear and coherent with logical content.  Patient is alert and oriented at baseline.   Assessment and Plan: 1. COVID-19 - MyChart COVID-19 home monitoring program; Future - benzonatate (TESSALON) 100 MG capsule; Take 1 capsule (100 mg total) by mouth 3 (three) times daily as needed for cough.  Dispense: 30 capsule; Refill: 0 - molnupiravir EUA 200 mg CAPS; Take 4 capsules (800 mg total) by mouth 2 (two) times daily for 5 days.  Dispense: 40 capsule; Refill: 0 Patient currently with mild symptoms, testing positive today with just a couple days of symptoms.  Patient has a substantial cardiac, neurological and renal history.  Also with type 2 diabetes.  She is absolutely a candidate for antiviral therapy.  Discussed her only option at present for this would be molnupiravir.  Discussed risk versus benefits of antiviral medications.  Side effects reviewed.  She would like to proceed with medication.  Rx sent to her requested pharmacy.  Supportive measures and OTC medications reviewed.  She is already taking vitamin D and vitamin C.  Encouraged her to continue these.  We will have her add on OTC plain Mucinex.  Tessalon per orders.  Patient enrolled in a COVID symptom monitoring program.  She is to fill these out daily so we can keep  track of her symptoms.  Strict ER precautions reviewed with patient who voiced understanding and agreement with plan.  Follow Up Instructions: I discussed the assessment and treatment plan with the patient. The patient was provided an opportunity to ask questions and all were answered. The patient agreed with the plan and demonstrated an understanding of the instructions.  A copy of instructions were sent to the patient via MyChart.  The patient was advised to call back or seek an in-person evaluation if the symptoms worsen or if the condition fails to improve as anticipated.  Time:  I spent 15 minutes with the patient via telehealth technology discussing the above problems/concerns.    Emily Rio, PA-C

## 2021-05-13 LAB — CUP PACEART REMOTE DEVICE CHECK
Battery Remaining Longevity: 27 mo
Battery Voltage: 2.93 V
Brady Statistic AP VP Percent: 0.04 %
Brady Statistic AP VS Percent: 0.02 %
Brady Statistic AS VP Percent: 98.6 %
Brady Statistic AS VS Percent: 1.34 %
Brady Statistic RA Percent Paced: 0.06 %
Brady Statistic RV Percent Paced: 0.17 %
Date Time Interrogation Session: 20220704022703
HighPow Impedance: 37 Ohm
HighPow Impedance: 51 Ohm
Implantable Lead Implant Date: 20090610
Implantable Lead Implant Date: 20090610
Implantable Lead Implant Date: 20090610
Implantable Lead Location: 753858
Implantable Lead Location: 753859
Implantable Lead Location: 753860
Implantable Lead Model: 4194
Implantable Lead Model: 5076
Implantable Lead Model: 6947
Implantable Pulse Generator Implant Date: 20160323
Lead Channel Impedance Value: 285 Ohm
Lead Channel Impedance Value: 361 Ohm
Lead Channel Impedance Value: 456 Ohm
Lead Channel Impedance Value: 456 Ohm
Lead Channel Impedance Value: 532 Ohm
Lead Channel Impedance Value: 665 Ohm
Lead Channel Pacing Threshold Amplitude: 0.875 V
Lead Channel Pacing Threshold Amplitude: 0.875 V
Lead Channel Pacing Threshold Amplitude: 1.5 V
Lead Channel Pacing Threshold Pulse Width: 0.4 ms
Lead Channel Pacing Threshold Pulse Width: 0.4 ms
Lead Channel Pacing Threshold Pulse Width: 0.4 ms
Lead Channel Sensing Intrinsic Amplitude: 1.875 mV
Lead Channel Sensing Intrinsic Amplitude: 1.875 mV
Lead Channel Sensing Intrinsic Amplitude: 4.625 mV
Lead Channel Sensing Intrinsic Amplitude: 4.625 mV
Lead Channel Setting Pacing Amplitude: 1.75 V
Lead Channel Setting Pacing Amplitude: 2 V
Lead Channel Setting Pacing Amplitude: 2.5 V
Lead Channel Setting Pacing Pulse Width: 0.4 ms
Lead Channel Setting Pacing Pulse Width: 0.4 ms
Lead Channel Setting Sensing Sensitivity: 0.3 mV

## 2021-05-14 ENCOUNTER — Ambulatory Visit (INDEPENDENT_AMBULATORY_CARE_PROVIDER_SITE_OTHER): Payer: Medicare Other

## 2021-05-14 DIAGNOSIS — I255 Ischemic cardiomyopathy: Secondary | ICD-10-CM

## 2021-05-28 DIAGNOSIS — N184 Chronic kidney disease, stage 4 (severe): Secondary | ICD-10-CM | POA: Diagnosis not present

## 2021-06-02 DIAGNOSIS — I129 Hypertensive chronic kidney disease with stage 1 through stage 4 chronic kidney disease, or unspecified chronic kidney disease: Secondary | ICD-10-CM | POA: Diagnosis not present

## 2021-06-02 DIAGNOSIS — N2581 Secondary hyperparathyroidism of renal origin: Secondary | ICD-10-CM | POA: Diagnosis not present

## 2021-06-02 DIAGNOSIS — D631 Anemia in chronic kidney disease: Secondary | ICD-10-CM | POA: Diagnosis not present

## 2021-06-02 DIAGNOSIS — N184 Chronic kidney disease, stage 4 (severe): Secondary | ICD-10-CM | POA: Diagnosis not present

## 2021-06-05 NOTE — Progress Notes (Signed)
Remote ICD transmission.   

## 2021-06-18 DIAGNOSIS — I2510 Atherosclerotic heart disease of native coronary artery without angina pectoris: Secondary | ICD-10-CM | POA: Diagnosis not present

## 2021-06-18 DIAGNOSIS — Z833 Family history of diabetes mellitus: Secondary | ICD-10-CM | POA: Diagnosis not present

## 2021-06-18 DIAGNOSIS — Z8673 Personal history of transient ischemic attack (TIA), and cerebral infarction without residual deficits: Secondary | ICD-10-CM | POA: Diagnosis not present

## 2021-06-18 DIAGNOSIS — N184 Chronic kidney disease, stage 4 (severe): Secondary | ICD-10-CM | POA: Diagnosis not present

## 2021-06-18 DIAGNOSIS — E1151 Type 2 diabetes mellitus with diabetic peripheral angiopathy without gangrene: Secondary | ICD-10-CM | POA: Diagnosis not present

## 2021-07-02 DIAGNOSIS — I10 Essential (primary) hypertension: Secondary | ICD-10-CM | POA: Diagnosis not present

## 2021-07-02 DIAGNOSIS — Z95810 Presence of automatic (implantable) cardiac defibrillator: Secondary | ICD-10-CM | POA: Diagnosis not present

## 2021-07-02 DIAGNOSIS — E7800 Pure hypercholesterolemia, unspecified: Secondary | ICD-10-CM | POA: Diagnosis not present

## 2021-07-02 DIAGNOSIS — N184 Chronic kidney disease, stage 4 (severe): Secondary | ICD-10-CM | POA: Diagnosis not present

## 2021-07-02 DIAGNOSIS — I2510 Atherosclerotic heart disease of native coronary artery without angina pectoris: Secondary | ICD-10-CM | POA: Diagnosis not present

## 2021-07-02 DIAGNOSIS — Z0000 Encounter for general adult medical examination without abnormal findings: Secondary | ICD-10-CM | POA: Diagnosis not present

## 2021-07-02 DIAGNOSIS — K219 Gastro-esophageal reflux disease without esophagitis: Secondary | ICD-10-CM | POA: Diagnosis not present

## 2021-07-02 DIAGNOSIS — J4520 Mild intermittent asthma, uncomplicated: Secondary | ICD-10-CM | POA: Diagnosis not present

## 2021-07-02 DIAGNOSIS — Z1159 Encounter for screening for other viral diseases: Secondary | ICD-10-CM | POA: Diagnosis not present

## 2021-07-02 DIAGNOSIS — Z1389 Encounter for screening for other disorder: Secondary | ICD-10-CM | POA: Diagnosis not present

## 2021-08-13 ENCOUNTER — Ambulatory Visit (INDEPENDENT_AMBULATORY_CARE_PROVIDER_SITE_OTHER): Payer: Medicare Other

## 2021-08-13 DIAGNOSIS — I255 Ischemic cardiomyopathy: Secondary | ICD-10-CM | POA: Diagnosis not present

## 2021-08-13 LAB — CUP PACEART REMOTE DEVICE CHECK
Battery Remaining Longevity: 26 mo
Battery Voltage: 2.92 V
Brady Statistic AP VP Percent: 0.05 %
Brady Statistic AP VS Percent: 0.02 %
Brady Statistic AS VP Percent: 98.56 %
Brady Statistic AS VS Percent: 1.37 %
Brady Statistic RA Percent Paced: 0.07 %
Brady Statistic RV Percent Paced: 0.07 %
Date Time Interrogation Session: 20221006012407
HighPow Impedance: 38 Ohm
HighPow Impedance: 51 Ohm
Implantable Lead Implant Date: 20090610
Implantable Lead Implant Date: 20090610
Implantable Lead Implant Date: 20090610
Implantable Lead Location: 753858
Implantable Lead Location: 753859
Implantable Lead Location: 753860
Implantable Lead Model: 4194
Implantable Lead Model: 5076
Implantable Lead Model: 6947
Implantable Pulse Generator Implant Date: 20160323
Lead Channel Impedance Value: 285 Ohm
Lead Channel Impedance Value: 361 Ohm
Lead Channel Impedance Value: 456 Ohm
Lead Channel Impedance Value: 456 Ohm
Lead Channel Impedance Value: 532 Ohm
Lead Channel Impedance Value: 646 Ohm
Lead Channel Pacing Threshold Amplitude: 0.75 V
Lead Channel Pacing Threshold Amplitude: 0.875 V
Lead Channel Pacing Threshold Amplitude: 1.25 V
Lead Channel Pacing Threshold Pulse Width: 0.4 ms
Lead Channel Pacing Threshold Pulse Width: 0.4 ms
Lead Channel Pacing Threshold Pulse Width: 0.4 ms
Lead Channel Sensing Intrinsic Amplitude: 2.25 mV
Lead Channel Sensing Intrinsic Amplitude: 2.25 mV
Lead Channel Sensing Intrinsic Amplitude: 4.5 mV
Lead Channel Sensing Intrinsic Amplitude: 4.5 mV
Lead Channel Setting Pacing Amplitude: 1.75 V
Lead Channel Setting Pacing Amplitude: 2 V
Lead Channel Setting Pacing Amplitude: 2.5 V
Lead Channel Setting Pacing Pulse Width: 0.4 ms
Lead Channel Setting Pacing Pulse Width: 0.4 ms
Lead Channel Setting Sensing Sensitivity: 0.3 mV

## 2021-08-24 NOTE — Progress Notes (Signed)
Remote ICD transmission.   

## 2021-09-01 ENCOUNTER — Other Ambulatory Visit: Payer: Self-pay | Admitting: Interventional Cardiology

## 2021-09-16 ENCOUNTER — Encounter: Payer: Medicare Other | Admitting: Internal Medicine

## 2021-09-17 ENCOUNTER — Encounter: Payer: Self-pay | Admitting: Internal Medicine

## 2021-09-17 ENCOUNTER — Ambulatory Visit: Payer: Medicare Other | Admitting: Internal Medicine

## 2021-09-17 ENCOUNTER — Other Ambulatory Visit: Payer: Self-pay

## 2021-09-17 VITALS — BP 142/90 | HR 79 | Ht 64.0 in | Wt 131.4 lb

## 2021-09-17 DIAGNOSIS — Z95810 Presence of automatic (implantable) cardiac defibrillator: Secondary | ICD-10-CM | POA: Diagnosis not present

## 2021-09-17 DIAGNOSIS — N184 Chronic kidney disease, stage 4 (severe): Secondary | ICD-10-CM | POA: Diagnosis not present

## 2021-09-17 DIAGNOSIS — I255 Ischemic cardiomyopathy: Secondary | ICD-10-CM | POA: Diagnosis not present

## 2021-09-17 NOTE — Patient Instructions (Addendum)
Medication Instructions:  Your physician recommends that you continue on your current medications as directed. Please refer to the Current Medication list given to you today.  Labwork: None ordered.  Testing/Procedures: None ordered.  Follow-Up: Your physician wants you to follow-up in: one year with Cristopher Peru, MD   Remote monitoring is used to monitor your ICD from home. This monitoring reduces the number of office visits required to check your device to one time per year. It allows [place] to keep an eye on the functioning of your device to ensure it is working properly. You are scheduled for a device check from home on 11/12/2021. You may send your transmission at any time that day. If you have a wireless device, the transmission will be sent automatically. After your physician reviews your transmission, you will receive a postcard with your next transmission date.  Any Other Special Instructions Will Be Listed Below (If Applicable).  If you need a refill on your cardiac medications before your next appointment, please call your pharmacy.

## 2021-09-17 NOTE — Progress Notes (Signed)
HPI [name] returns today for followup. She is a pleasant 70 yo woman with CAD, s/p MI, with an ICM, s/p Biv ICD insertion. She has had trouble with weight gain and then was placed on Ozempic. She has lost almost 40 lbs. She feels well. She appears much younger than her stated age. She also notes stage 4 renal failure.  Allergies  Allergen Reactions   Calcium Channel Blockers Other (See Comments)    Cannot take non-DHP CCBs (diltiazem, verapamil) due to CHF requiring ICD   Codeine Anaphylaxis and Other (See Comments)   Lyrica [Pregabalin]     Nightmares, and swelling   Other Anaphylaxis and Other (See Comments)    Walnuts, pecans, and ANY melons PATIENT IS A VEGETARIAN    Ramipril Anaphylaxis, Swelling and Other (See Comments)   Ticlid [Ticlopidine Hcl] Other (See Comments)    Unprovoked bleeding while on Ticlid (doesn't think she was on ASA at the time) Takes Plavix without problems   Morphine And Related Other (See Comments)    Severe AMS, confusion, weakness Tolerates Fentanyl, Tramadol   Tape Dermatitis and Rash    Tolerates paper tape   Digoxin Other (See Comments)   Digoxin And Related Nausea Only    Unknown   Diltiazem Hcl Other (See Comments)   Diltiazem Hcl Er Beads Other (See Comments)   Metformin Hcl Other (See Comments)   Metolazone Other (See Comments)    Cannot remember reaction   Milk-Related Compounds Other (See Comments)    Bad gas   Morphine Sulfate Other (See Comments)   Myrbetriq [Mirabegron] Other (See Comments)    Aggravates migraines   Nsaids Other (See Comments)   Onglyza [Saxagliptin] Other (See Comments)    Exacerbates migraines   Penicillin V Other (See Comments)   Penicillins Hives    Has patient had a PCN reaction causing immediate rash, facial/tongue/throat swelling, SOB or lightheadedness with hypotension: Yes Has patient had a PCN reaction causing severe rash involving mucus membranes or skin necrosis: Unknown Has patient had  a PCN reaction that required hospitalization: Unknown Has patient had a PCN reaction occurring within the last 10 years: No If all of the above answers are "NO", then may proceed with Cephalosporin use.    Spironolactone Other (See Comments)    Cannot remember reaction   Sulfa Antibiotics Hives and Other (See Comments)   Tetracycline Hcl Other (See Comments)   Tetracyclines & Related Other (See Comments)    Cannot remember reaction Tolerates macrolides (azithromycin)   Ticlopidine Other (See Comments)   Verapamil Other (See Comments)    Pacemaker/CHF   Vicodin [Hydrocodone-Acetaminophen] Other (See Comments)    EXTREME LETHARGY   Latex Itching, Rash and Other (See Comments)     Current Outpatient Medications  Medication Sig Dispense Refill   albuterol (PROVENTIL HFA;VENTOLIN HFA) 108 (90 BASE) MCG/ACT inhaler Inhale 2 puffs into the lungs every 4 (four) hours as needed for wheezing or shortness of breath.     Alcohol Swabs (B-D SINGLE USE SWABS REGULAR) PADS      ALPRAZolam (XANAX) 1 MG tablet Take 1 mg by mouth 3 (three) times daily.   2   Artificial Tear GEL Place 2 drops into both eyes daily as needed (dry eyes).      aspirin 325 MG EC tablet Take 325 mg by mouth daily.     benzonatate (TESSALON) 100 MG capsule Take 1 capsule (100 mg total) by mouth 3 (three) times daily as needed  for cough. 30 capsule 0   BIOTIN PO Take by mouth daily.     cetirizine (ZYRTEC) 10 MG tablet Take 10 mg by mouth daily.     Cholecalciferol (VITAMIN D-3) 1000 UNITS CAPS Take 1,000 Units by mouth daily.      clopidogrel (PLAVIX) 75 MG tablet TAKE 1 TABLET BY MOUTH  DAILY 90 tablet 2   cyclobenzaprine (FLEXERIL) 5 MG tablet Take 1 tablet (5 mg total) by mouth at bedtime as needed for muscle spasms. 30 tablet 0   docusate sodium (COLACE) 250 MG capsule Take 250 mg by mouth daily.     doxazosin (CARDURA) 4 MG tablet Take 4 mg by mouth at bedtime.      DROPLET PEN NEEDLES 32G X 4 MM MISC      EPINEPHrine  0.3 mg/0.3 mL IJ SOAJ injection Inject 0.3 mg into the muscle as needed for anaphylaxis.   3   ezetimibe (ZETIA) 10 MG tablet TAKE 1 TABLET BY MOUTH  DAILY 90 tablet 2   Flaxseed, Linseed, (FLAXSEED OIL) 1200 MG CAPS Take 1,200 mg by mouth daily.     fluticasone (FLONASE) 50 MCG/ACT nasal spray Place 2 sprays into both nostrils daily as needed for allergies or rhinitis.     furosemide (LASIX) 80 MG tablet TAKE 1 TABLET TWICE A DAY. MAY TAKE AN ADDITIONAL 80MG  (1 TABLET) AS NEEDED FOR EDEMA 270 tablet 3   hydrALAZINE (APRESOLINE) 25 MG tablet TAKE 1 TABLET BY MOUTH  TWICE DAILY 180 tablet 3   isosorbide mononitrate (IMDUR) 60 MG 24 hr tablet TAKE 1 TABLET BY MOUTH  DAILY 90 tablet 2   Ketotifen Fumarate (ALAWAY OP) Place 1 drop into both eyes daily as needed (allergies).      LANTUS SOLOSTAR 100 UNIT/ML Solostar Pen Inject 6 Units into the skin at bedtime.      metoprolol succinate (TOPROL-XL) 100 MG 24 hr tablet TAKE 2 TABLETS BY MOUTH  DAILY TAKE WITH OR  IMMEDIATELY FOLLOWING A  MEAL 180 tablet 3   Multiple Vitamin (MULTIVITAMIN WITH MINERALS) TABS Take 1 tablet by mouth daily.     nitroGLYCERIN (NITROSTAT) 0.4 MG SL tablet Place 1 tablet under the tongue every 5 minutes as needed for chest pain, max 3 doses, go to er if no relief 25 tablet 2   olmesartan (BENICAR) 40 MG tablet TAKE 1 TABLET BY MOUTH  DAILY 90 tablet 2   ondansetron (ZOFRAN) 8 MG tablet Take 8 mg by mouth every 8 (eight) hours as needed for nausea or vomiting.      OZEMPIC, 0.25 OR 0.5 MG/DOSE, 2 MG/1.5ML SOPN Inject 0.25 mg into the skin once a week. Sunday     pantoprazole (PROTONIX) 40 MG tablet TAKE 1 TABLET BY MOUTH  DAILY 90 tablet 3   potassium chloride (KLOR-CON) 10 MEQ tablet TAKE 2 TABLETS BY MOUTH  TWICE DAILY 360 tablet 2   simvastatin (ZOCOR) 40 MG tablet TAKE 1 TABLET BY MOUTH AT  BEDTIME 90 tablet 3   sodium chloride (MURO 128) 5 % ophthalmic ointment Place 1 drop into both eyes at bedtime. For dry eyes      traMADol (ULTRAM) 50 MG tablet Take 100 mg by mouth every 6 (six) hours as needed (MIGRAINES).     TRUE METRIX BLOOD GLUCOSE TEST test strip      TRUEplus Lancets 33G MISC      venlafaxine (EFFEXOR) 100 MG tablet Take 100-200 mg by mouth See admin instructions. TAKE 200 mg by  mouth in the morning and take 100 mg at noon     vitamin A 7500 UNIT capsule Take 2,400 Units by mouth daily.     No current facility-administered medications for this visit.     Past Medical History:  Diagnosis Date   AICD (automatic cardioverter/defibrillator) present    Medtronic- Dr. Beckie Salts follows   Anginal pain ([place])    Anxiety    Asthma    Back pain    "pinched nerve-lower back" - Dr. Nelva Bush follows.   Biventricular implantable cardioverter-defibrillator in situ 06/18/2011   Cerebral infarction ([place]) 11/11/2012   Cervical dysplasia    Chronic diastolic heart failure ([place]) 03/22/2016   Chronic kidney disease    Dr. Posey Pronto follows.   Chronic renal insufficiency, stage III (moderate) (HCC) 12/10/9796   Complication of anesthesia    "I wake up during surgeries" (02/14/2013)   Coronary artery disease involving coronary bypass graft of native heart with angina pectoris ([place]) 06/18/2011   Depression    DM (diabetes mellitus) ([place]) 11/11/2012   Fibroid    Function kidney decreased    GERD (gastroesophageal reflux disease)    Hemiplegia, unspecified, affecting nondominant side 11/11/2012   Hepatitis    Hepatitis A -college yrs"water source exposure"   Hiatal hernia    History of shingles    2-3 yrs ago last out break "around waist"   History of stomach ulcers    Hyperlipidemia 07/30/2016   Hypertension    ICD (implantable cardiac defibrillator) in place    Iron deficiency anemia    Ischemic cardiomyopathy    status post biventricular ICD placed by DR Edumunds who used to see Dr Melvern Banker here to establish  cardiovascular care.   Migraines    MVP (mitral valve prolapse)    Antibiotics not required for procedures    Myocardial infarction ([place])    "I've had 2; the others they were able to catch before completing" (02/14/2013)   Pacemaker    Paroxysmal SVT (supraventricular tachycardia) ([place]) 06/18/2011   Pneumonia 1950's & 1985   Shortness of breath    "lying down flat; at times w/exertion" (02/14/2013). 10-06-15 exertion only..   Stroke [facility])    "2 confirmed; 9 TIA's; results in dragging LLE; numbness in tip of tongue" (02/14/2013),10-06-15 right hand tends to be weaker when tired.   TIA (transient ischemic attack) 11/11/2012   Type II diabetes mellitus ([place])     ROS:   All systems reviewed and negative except as noted in the HPI.   Past Surgical History:  Procedure Laterality Date   ABDOMINAL HYSTERECTOMY  1985   TAH    BIV ICD [place] CHANGE OUT  06/2007; 04/2008   "2 lead initial placement, at [facility]; done at [facility], after developing CHF" (02/14/2013)   BIV PACEMAKER GENERATOR CHANGE OUT N/A 01/29/2015   Procedure: BIV PACEMAKER GENERATOR CHANGE OUT;  Surgeon: Evans Lance, MD;  Location: [facility] CATH LAB;  Service: Cardiovascular;  Laterality: N/A;   BREAST EXCISIONAL BIOPSY Left 01/2007; 06/2007; 03/2008   "benign" (02/14/2013)   BREAST SURGERY     CARDIAC CATHETERIZATION     "probably in the teens" (02/14/2013)   CARDIAC DEFIBRILLATOR PLACEMENT     COLONOSCOPY  ~ 2002   COLONOSCOPY WITH PROPOFOL N/A 10/07/2015   Procedure: COLONOSCOPY WITH PROPOFOL;  Surgeon: Juanita Craver, MD;  Location: [facility] ENDOSCOPY;  Service: Endoscopy;  Laterality: N/A;   CORONARY ANGIOPLASTY WITH STENT PLACEMENT     "started out w/5; bypass corrected some; 1 stent  since the bypass" (02/14/2013)   CORONARY ARTERY BYPASS GRAFT  ` 1998   LIMA-LAD, SVG-D1, SVG-PDA   DILATION AND CURETTAGE OF UTERUS  1975 X 2; 1976; 1977   INSERT / REPLACE / REMOVE PACEMAKER     biventricular defibrillator--06/10/ 2009   LEFT HEART CATH AND CORS/GRAFTS ANGIOGRAPHY N/A 08/04/2018   Procedure: LEFT HEART CATH AND CORS/GRAFTS ANGIOGRAPHY;  Surgeon: Burnell Blanks, MD;  Location: [place] CV LAB;  Service: Cardiovascular;  Laterality: N/A;   LEFT HEART CATHETERIZATION WITH CORONARY/GRAFT ANGIOGRAM N/A 01/03/2015   Procedure: LEFT HEART CATHETERIZATION WITH Beatrix Fetters;  Surgeon: Sinclair Grooms, MD;  Location: [facility] CATH LAB;  Service: Cardiovascular;  Laterality: N/A;   SUPRAVENTRICULAR TACHYCARDIA ABLATION  06/2007   TEE WITHOUT CARDIOVERSION  11/14/2012   Procedure: TRANSESOPHAGEAL ECHOCARDIOGRAM (TEE);  Surgeon: Candee Furbish, MD;  Location: [facility] ENDOSCOPY;  Service: Cardiovascular;  Laterality: N/A;     Family History  Problem Relation Age of Onset   Heart disease Mother    Hypertension Mother    Heart attack Mother    Stroke Mother    Heart disease Father    Hypertension Father    Diabetes Father    Kidney failure Father    Heart attack Father    Stroke Father    Heart disease Brother    Diabetes Brother    Kidney failure Brother    Heart attack Brother    Diabetes Paternal Grandmother      Social History   Socioeconomic History   Marital status: Married    Spouse name: [name]   Number of children: 1   Years of education: MA   Highest education level: Not on file  Occupational History    Employer: UNEMPLOYED    Comment: Disablity  Tobacco Use   Smoking status: Never   Smokeless tobacco: Never  Vaping Use   Vaping Use: Never used  Substance and Sexual Activity   Alcohol use: No    Alcohol/week: 0.0 standard drinks   Drug use: No   Sexual activity: Yes    Birth control/protection: Surgical  Other Topics Concern   Not on file  Social History Narrative   Patient lives at home with spouse.     Daughter name is Tourist information centre manager.   Caffeine Use: none   Social Determinants of Radio broadcast assistant Strain: Not on file  Food Insecurity: Not on file  Transportation Needs: Not on file  Physical Activity: Not on file  Stress: Not on file  Social Connections: Not on file  Intimate Partner Violence: Not  on file     BP (!) 142/90   Pulse 79   Ht 5\' 4"  (1.626 m)   Wt 131 lb 6.4 oz (59.6 kg)   SpO2 98%   BMI 22.55 kg/m   Physical Exam:  Well appearing 70 yo woman, NAD HEENT: Unremarkable Neck:  6 cm JVD, no thyromegally Lymphatics:  No adenopathy Back:  No CVA tenderness Lungs:  Clear with no wheezes HEART:  Regular rate rhythm, no murmurs, no rubs, no clicks Abd:  soft, positive bowel sounds, no organomegally, no rebound, no guarding Ext:  2 plus pulses, no edema, no cyanosis, no clubbing Skin:  No rashes no nodules Neuro:  CN II through XII intact, motor grossly intact  EKG - none  DEVICE  Normal device function.  See PaceArt for details.   Assess/Plan:  Chronic systolic heart failure - her symptoms are class 2. She will continue her  current meds. She will maintain a low sodium diet. ICD - her medtronic BIV ICD is working normally.  HTN - her bp is well controlled. She will maintain a low sodium diet. 4. Weight loss - she has lost almost 40 lbs. I encouraged her to keep her weight down.  Carleene Overlie Johnella Crumm,MD

## 2021-09-22 LAB — CUP PACEART INCLINIC DEVICE CHECK
Battery Remaining Longevity: 26 mo
Battery Voltage: 2.9 V
Brady Statistic AP VP Percent: 0.04 %
Brady Statistic AP VS Percent: 0.02 %
Brady Statistic AS VP Percent: 98.59 %
Brady Statistic AS VS Percent: 1.34 %
Brady Statistic RA Percent Paced: 0.06 %
Brady Statistic RV Percent Paced: 0.28 %
Date Time Interrogation Session: 20221110161600
HighPow Impedance: 38 Ohm
HighPow Impedance: 52 Ohm
Implantable Lead Implant Date: 20090610
Implantable Lead Implant Date: 20090610
Implantable Lead Implant Date: 20090610
Implantable Lead Location: 753858
Implantable Lead Location: 753859
Implantable Lead Location: 753860
Implantable Lead Model: 4194
Implantable Lead Model: 5076
Implantable Lead Model: 6947
Implantable Pulse Generator Implant Date: 20160323
Lead Channel Impedance Value: 285 Ohm
Lead Channel Impedance Value: 361 Ohm
Lead Channel Impedance Value: 475 Ohm
Lead Channel Impedance Value: 475 Ohm
Lead Channel Impedance Value: 532 Ohm
Lead Channel Impedance Value: 665 Ohm
Lead Channel Pacing Threshold Amplitude: 0.75 V
Lead Channel Pacing Threshold Amplitude: 1 V
Lead Channel Pacing Threshold Amplitude: 1.25 V
Lead Channel Pacing Threshold Pulse Width: 0.4 ms
Lead Channel Pacing Threshold Pulse Width: 0.4 ms
Lead Channel Pacing Threshold Pulse Width: 0.4 ms
Lead Channel Sensing Intrinsic Amplitude: 1.875 mV
Lead Channel Sensing Intrinsic Amplitude: 2.5 mV
Lead Channel Sensing Intrinsic Amplitude: 5 mV
Lead Channel Sensing Intrinsic Amplitude: 5.5 mV
Lead Channel Setting Pacing Amplitude: 1.5 V
Lead Channel Setting Pacing Amplitude: 2 V
Lead Channel Setting Pacing Amplitude: 2.5 V
Lead Channel Setting Pacing Pulse Width: 0.4 ms
Lead Channel Setting Pacing Pulse Width: 0.4 ms
Lead Channel Setting Sensing Sensitivity: 0.3 mV

## 2021-09-24 DIAGNOSIS — N2581 Secondary hyperparathyroidism of renal origin: Secondary | ICD-10-CM | POA: Diagnosis not present

## 2021-09-24 DIAGNOSIS — N184 Chronic kidney disease, stage 4 (severe): Secondary | ICD-10-CM | POA: Diagnosis not present

## 2021-09-24 DIAGNOSIS — I129 Hypertensive chronic kidney disease with stage 1 through stage 4 chronic kidney disease, or unspecified chronic kidney disease: Secondary | ICD-10-CM | POA: Diagnosis not present

## 2021-09-24 DIAGNOSIS — D631 Anemia in chronic kidney disease: Secondary | ICD-10-CM | POA: Diagnosis not present

## 2021-10-01 ENCOUNTER — Other Ambulatory Visit: Payer: Self-pay | Admitting: Interventional Cardiology

## 2021-10-10 ENCOUNTER — Other Ambulatory Visit: Payer: Self-pay | Admitting: Interventional Cardiology

## 2021-10-15 ENCOUNTER — Other Ambulatory Visit: Payer: Self-pay | Admitting: Interventional Cardiology

## 2021-10-16 MED ORDER — POTASSIUM CHLORIDE CRYS ER 10 MEQ PO TBCR: EXTENDED_RELEASE_TABLET | ORAL | 1 refills | Status: DC

## 2021-10-21 ENCOUNTER — Other Ambulatory Visit: Payer: Self-pay | Admitting: Interventional Cardiology

## 2021-10-26 ENCOUNTER — Telehealth: Payer: Self-pay | Admitting: Interventional Cardiology

## 2021-10-26 MED ORDER — FUROSEMIDE 80 MG PO TABS: ORAL_TABLET | ORAL | 1 refills | Status: DC

## 2021-10-26 NOTE — Telephone Encounter (Signed)
°*  STAT* If patient is at the pharmacy, call can be transferred to refill team.   1. Which medications need to be refilled? (please list name of each medication and dose if known) furosemide (LASIX) 80 MG tablet  2. Which pharmacy/location (including street and city if local pharmacy) is medication to be sent to? [facility] ([place], [place]  3. Do they need a 30 day or 90 day supply? [place]

## 2021-10-26 NOTE — Telephone Encounter (Signed)
Pt's medication was sent to pt's pharmacy as requested. Confirmation received.  °

## 2021-10-27 DIAGNOSIS — H04123 Dry eye syndrome of bilateral lacrimal glands: Secondary | ICD-10-CM | POA: Diagnosis not present

## 2021-10-27 DIAGNOSIS — H35033 Hypertensive retinopathy, bilateral: Secondary | ICD-10-CM | POA: Diagnosis not present

## 2021-10-27 DIAGNOSIS — E119 Type 2 diabetes mellitus without complications: Secondary | ICD-10-CM | POA: Diagnosis not present

## 2021-10-27 DIAGNOSIS — H25813 Combined forms of age-related cataract, bilateral: Secondary | ICD-10-CM | POA: Diagnosis not present

## 2021-11-11 DIAGNOSIS — Z1231 Encounter for screening mammogram for malignant neoplasm of breast: Secondary | ICD-10-CM | POA: Diagnosis not present

## 2021-11-12 ENCOUNTER — Ambulatory Visit (INDEPENDENT_AMBULATORY_CARE_PROVIDER_SITE_OTHER): Payer: Medicare Other

## 2021-11-12 DIAGNOSIS — I255 Ischemic cardiomyopathy: Secondary | ICD-10-CM

## 2021-11-12 LAB — CUP PACEART REMOTE DEVICE CHECK
Battery Remaining Longevity: 23 mo
Battery Voltage: 2.92 V
Brady Statistic AP VP Percent: 0.02 %
Brady Statistic AP VS Percent: 0.01 %
Brady Statistic AS VP Percent: 98.67 %
Brady Statistic AS VS Percent: 1.3 %
Brady Statistic RA Percent Paced: 0.03 %
Brady Statistic RV Percent Paced: 0.13 %
Date Time Interrogation Session: 20230105012307
HighPow Impedance: 38 Ohm
HighPow Impedance: 53 Ohm
Implantable Lead Implant Date: 20090610
Implantable Lead Implant Date: 20090610
Implantable Lead Implant Date: 20090610
Implantable Lead Location: 753858
Implantable Lead Location: 753859
Implantable Lead Location: 753860
Implantable Lead Model: 4194
Implantable Lead Model: 5076
Implantable Lead Model: 6947
Implantable Pulse Generator Implant Date: 20160323
Lead Channel Impedance Value: 285 Ohm
Lead Channel Impedance Value: 361 Ohm
Lead Channel Impedance Value: 456 Ohm
Lead Channel Impedance Value: 475 Ohm
Lead Channel Impedance Value: 551 Ohm
Lead Channel Impedance Value: 665 Ohm
Lead Channel Pacing Threshold Amplitude: 0.875 V
Lead Channel Pacing Threshold Amplitude: 0.875 V
Lead Channel Pacing Threshold Amplitude: 1.125 V
Lead Channel Pacing Threshold Pulse Width: 0.4 ms
Lead Channel Pacing Threshold Pulse Width: 0.4 ms
Lead Channel Pacing Threshold Pulse Width: 0.4 ms
Lead Channel Sensing Intrinsic Amplitude: 1.625 mV
Lead Channel Sensing Intrinsic Amplitude: 1.625 mV
Lead Channel Sensing Intrinsic Amplitude: 4.375 mV
Lead Channel Sensing Intrinsic Amplitude: 4.375 mV
Lead Channel Setting Pacing Amplitude: 1.75 V
Lead Channel Setting Pacing Amplitude: 2 V
Lead Channel Setting Pacing Amplitude: 2.25 V
Lead Channel Setting Pacing Pulse Width: 0.4 ms
Lead Channel Setting Pacing Pulse Width: 0.4 ms
Lead Channel Setting Sensing Sensitivity: 0.3 mV

## 2021-11-13 ENCOUNTER — Encounter: Payer: Self-pay | Admitting: Interventional Cardiology

## 2021-11-13 ENCOUNTER — Other Ambulatory Visit: Payer: Self-pay

## 2021-11-13 ENCOUNTER — Ambulatory Visit: Payer: Medicare Other | Admitting: Interventional Cardiology

## 2021-11-13 VITALS — BP 116/68 | HR 77 | Ht 64.0 in | Wt 130.0 lb

## 2021-11-13 DIAGNOSIS — N1832 Chronic kidney disease, stage 3b: Secondary | ICD-10-CM | POA: Diagnosis not present

## 2021-11-13 DIAGNOSIS — I5022 Chronic systolic (congestive) heart failure: Secondary | ICD-10-CM | POA: Diagnosis not present

## 2021-11-13 DIAGNOSIS — I10 Essential (primary) hypertension: Secondary | ICD-10-CM

## 2021-11-13 DIAGNOSIS — E119 Type 2 diabetes mellitus without complications: Secondary | ICD-10-CM | POA: Diagnosis not present

## 2021-11-13 DIAGNOSIS — Z794 Long term (current) use of insulin: Secondary | ICD-10-CM

## 2021-11-13 DIAGNOSIS — I2510 Atherosclerotic heart disease of native coronary artery without angina pectoris: Secondary | ICD-10-CM

## 2021-11-13 DIAGNOSIS — Z95810 Presence of automatic (implantable) cardiac defibrillator: Secondary | ICD-10-CM | POA: Diagnosis not present

## 2021-11-13 DIAGNOSIS — E782 Mixed hyperlipidemia: Secondary | ICD-10-CM

## 2021-11-13 NOTE — Patient Instructions (Signed)
Medication Instructions:  Your physician recommends that you continue on your current medications as directed. Please refer to the Current Medication list given to you today.  *If you need a refill on your cardiac medications before your next appointment, please call your pharmacy*   Lab Work: None If you have labs (blood work) drawn today and your tests are completely normal, you will receive your results only by: [place] (if you have MyChart) OR A paper copy in the mail If you have any lab test that is abnormal or we need to change your treatment, we will call you to review the results.   Testing/Procedures: None   Follow-Up: At [facility], you and your health needs are our priority.  As part of our continuing mission to provide you with exceptional heart care, we have created designated Provider Care Teams.  These Care Teams include your primary Cardiologist (physician) and Advanced Practice Providers (APPs -  Physician Assistants and Nurse Practitioners) who all work together to provide you with the care you need, when you need it.  We recommend signing up for the patient portal called "MyChart".  Sign up information is provided on this After Visit Summary.  MyChart is used to connect with patients for Virtual Visits (Telemedicine).  Patients are able to view lab/test results, encounter notes, upcoming appointments, etc.  Non-urgent messages can be sent to your provider as well.   To learn more about what you can do with MyChart, go to [email or web address].    Your next appointment:   6-8 month(s)  The format for your next appointment:   In Person  Provider:   Sinclair Grooms, MD     Other Instructions

## 2021-11-13 NOTE — Progress Notes (Signed)
Cardiology Office Note:    Date:  11/13/2021   ID:  DELCIA [name], DOB [birth year], MRN [id number]  PCP:  [name], MD  Cardiologist:  Sinclair Grooms, MD   Referring MD: [name], MD   No chief complaint on file.   History of Present Illness:    [name] is a 71 y.o. female with a hx of  CAD, CHF, BiV-IC defibrillator, ischemic cardiomyopathy, mitral valve disease with moderate regurgitation, diabetes mellitus II, ischemic stroke, and recent episode of angioedema.    She does not have specific cardiac complaints.  She is denies angina, orthopnea, PND, lower extremity swelling.  She has not had syncope on AICD discharge.  She has been compliant with medication.  Her husband, [name], has had 1 leg amputated and is now threatening to lose the left lower extremity.  He has upcoming procedures.  Daughter is having significant medical issues.  Past Medical History:  Diagnosis Date   AICD (automatic cardioverter/defibrillator) present    [employer]- Dr. Beckie Salts follows   Anginal pain ([place])    Anxiety    Asthma    Back pain    "pinched nerve-lower back" - Dr. Nelva Bush follows.   Biventricular implantable cardioverter-defibrillator in situ 06/18/2011   Cerebral infarction ([place]) 11/11/2012   Cervical dysplasia    Chronic diastolic heart failure ([place]) 03/22/2016   Chronic kidney disease    Dr. Posey Pronto follows.   Chronic renal insufficiency, stage III (moderate) (HCC) 05/09/8205   Complication of anesthesia    "I wake up during surgeries" (02/14/2013)   Coronary artery disease involving coronary bypass graft of native heart with angina pectoris ([place]) 06/18/2011   Depression    DM (diabetes mellitus) ([place]) 11/11/2012   Fibroid    Function kidney decreased    GERD (gastroesophageal reflux disease)    Hemiplegia, unspecified, affecting nondominant side 11/11/2012   Hepatitis    Hepatitis A -college yrs"water source exposure"   Hiatal hernia    History of shingles     2-3 yrs ago last out break "around waist"   History of stomach ulcers    Hyperlipidemia 07/30/2016   Hypertension    ICD (implantable cardiac defibrillator) in place    Iron deficiency anemia    Ischemic cardiomyopathy    status post biventricular ICD placed by DR Edumunds who used to see Dr Melvern Banker here to establish  cardiovascular care.   Migraines    MVP (mitral valve prolapse)    Antibiotics not required for procedures   Myocardial infarction ([place])    "I've had 2; the others they were able to catch before completing" (02/14/2013)   Pacemaker    Paroxysmal SVT (supraventricular tachycardia) ([place]) 06/18/2011   Pneumonia 1950's & 1985   Shortness of breath    "lying down flat; at times w/exertion" (02/14/2013). 10-06-15 exertion only..   Stroke [facility])    "2 confirmed; 9 TIA's; results in dragging LLE; numbness in tip of tongue" (02/14/2013),10-06-15 right hand tends to be weaker when tired.   TIA (transient ischemic attack) 11/11/2012   Type II diabetes mellitus [facility])     Past Surgical History:  Procedure Laterality Date   ABDOMINAL HYSTERECTOMY  1985   TAH    BIV ICD GENERTAOR CHANGE OUT  06/2007; 04/2008   "2 lead initial placement, at [facility]; done at [facility], after developing CHF" (02/14/2013)   BIV PACEMAKER GENERATOR CHANGE OUT N/A 01/29/2015   Procedure: BIV PACEMAKER GENERATOR CHANGE OUT;  Surgeon: Evans Lance,  MD;  Location: [place] CATH LAB;  Service: Cardiovascular;  Laterality: N/A;   BREAST EXCISIONAL BIOPSY Left 01/2007; 06/2007; 03/2008   "benign" (02/14/2013)   BREAST SURGERY     CARDIAC CATHETERIZATION     "probably in the teens" (02/14/2013)   CARDIAC DEFIBRILLATOR PLACEMENT     COLONOSCOPY  ~ 2002   COLONOSCOPY WITH PROPOFOL N/A 10/07/2015   Procedure: COLONOSCOPY WITH PROPOFOL;  Surgeon: Juanita Craver, MD;  Location: [facility] ENDOSCOPY;  Service: Endoscopy;  Laterality: N/A;   CORONARY ANGIOPLASTY WITH STENT PLACEMENT     "started out w/5; bypass corrected some; 1 stent since the bypass"  (02/14/2013)   CORONARY ARTERY BYPASS GRAFT  ` 1998   LIMA-LAD, SVG-D1, SVG-PDA   DILATION AND CURETTAGE OF UTERUS  1975 X 2; 1976; 1977   INSERT / REPLACE / REMOVE PACEMAKER     biventricular defibrillator--06/10/ 2009   LEFT HEART CATH AND CORS/GRAFTS ANGIOGRAPHY N/A 08/04/2018   Procedure: LEFT HEART CATH AND CORS/GRAFTS ANGIOGRAPHY;  Surgeon: Burnell Blanks, MD;  Location: [place] CV LAB;  Service: Cardiovascular;  Laterality: N/A;   LEFT HEART CATHETERIZATION WITH CORONARY/GRAFT ANGIOGRAM N/A 01/03/2015   Procedure: LEFT HEART CATHETERIZATION WITH Beatrix Fetters;  Surgeon: Sinclair Grooms, MD;  Location[facility] C[facility] LAB;  Service: Cardiovascular;  Laterality: N/A;   SUPRAVENTRICULAR TACHYCARDIA ABLATION  06/2007   TEE WITHOUT CARDIOVERSION  11/14/2012   Procedure: TRANSESOPHAGEAL ECHOCARDIOGRAM (TEE);  Surgeon: Candee Furbish, MD;  Location: [facility] ENDOSCOPY;  Service: Cardiovascular;  Laterality: N/A;    Current Medications: Current Meds  Medication Sig   albuterol (PROVENTIL HFA;VENTOLIN HFA) 108 (90 BASE) MCG/ACT inhaler Inhale 2 puffs into the lungs every 4 (four) hours as needed for wheezing or shortness of breath.   Alcohol Swabs (B-D SINGLE USE SWABS REGULAR) PADS    ALPRAZolam (XANAX) 1 MG tablet Take 1 mg by mouth 3 (three) times daily.    Artificial Tear GEL Place 2 drops into both eyes daily as needed (dry eyes).    aspirin 325 MG EC tablet Take 325 mg by mouth daily.   benzonatate (TESSALON) 100 MG capsule Take 1 capsule (100 mg total) by mouth 3 (three) times daily as needed for cough.   BIOTIN PO Take by mouth daily.   cetirizine (ZYRTEC) 10 MG tablet Take 10 mg by mouth daily.   Cholecalciferol (VITAMIN D-3) 1000 UNITS CAPS Take 1,000 Units by mouth daily.    clopidogrel (PLAVIX) 75 MG tablet TAKE 1 TABLET BY MOUTH  DAILY   cyclobenzaprine (FLEXERIL) 5 MG tablet Take 1 tablet (5 mg total) by mouth at bedtime as needed for muscle spasms.   docusate sodium  (COLACE) 250 MG capsule Take 250 mg by mouth daily.   doxazosin (CARDURA) 4 MG tablet Take 4 mg by mouth at bedtime.    DROPLET PEN NEEDLES 32G X 4 MM MISC    EPINEPHrine 0.3 mg/0.3 mL IJ SOAJ injection Inject 0.3 mg into the muscle as needed for anaphylaxis.    ezetimibe (ZETIA) 10 MG tablet TAKE 1 TABLET BY MOUTH  DAILY   Flaxseed, Linseed, (FLAXSEED OIL) 1200 MG CAPS Take 1,200 mg by mouth daily.   fluticasone (FLONASE) 50 MCG/ACT nasal spray Place 2 sprays into both nostrils daily as needed for allergies or rhinitis.   furosemide (LASIX) 80 MG tablet TAKE 1 TABLET TWICE A DAY. MAY TAKE AN ADDITIONAL 80MG  (1 TABLET) AS NEEDED FOR EDEMA   hydrALAZINE (APRESOLINE) 25 MG tablet TAKE 1 TABLET BY MOUTH  TWICE DAILY   isosorbide mononitrate (IMDUR) 60 MG 24 hr tablet TAKE 1 TABLET BY MOUTH  DAILY   Ketotifen Fumarate (ALAWAY OP) Place 1 drop into both eyes daily as needed (allergies).    LANTUS SOLOSTAR 100 UNIT/ML Solostar Pen Inject 6 Units into the skin at bedtime.    metoprolol succinate (TOPROL-XL) 100 MG 24 hr tablet TAKE 2 TABLETS BY MOUTH  DAILY TAKE WITH OR  IMMEDIATELY FOLLOWING A  MEAL   Multiple Vitamin (MULTIVITAMIN WITH MINERALS) TABS Take 1 tablet by mouth daily.   nitroGLYCERIN (NITROSTAT) 0.4 MG SL tablet Place 1 tablet under the tongue every 5 minutes as needed for chest pain, max 3 doses, go to er if no relief   olmesartan (BENICAR) 40 MG tablet TAKE 1 TABLET BY MOUTH  DAILY   ondansetron (ZOFRAN) 8 MG tablet Take 8 mg by mouth every 8 (eight) hours as needed for nausea or vomiting.    OZEMPIC, 0.25 OR 0.5 MG/DOSE, 2 MG/1.5ML SOPN Inject 0.25 mg into the skin once a week. Sunday   pantoprazole (PROTONIX) 40 MG tablet TAKE 1 TABLET BY MOUTH  DAILY   potassium chloride (KLOR-CON M) 10 MEQ tablet TAKE 2 TABLETS BY MOUTH  TWICE DAILY   simvastatin (ZOCOR) 40 MG tablet TAKE 1 TABLET BY MOUTH AT  BEDTIME   sodium chloride (MURO 128) 5 % ophthalmic ointment Place 1 drop into both eyes  at bedtime. For dry eyes   traMADol (ULTRAM) 50 MG tablet Take 100 mg by mouth every 6 (six) hours as needed (MIGRAINES).   TRUE METRIX BLOOD GLUCOSE TEST test strip    TRUEplus Lancets 33G MISC    venlafaxine (EFFEXOR) 100 MG tablet Take 100-200 mg by mouth See admin instructions. TAKE 200 mg by mouth in the morning and take 100 mg at noon   vitamin A 7500 UNIT capsule Take 2,400 Units by mouth daily.     Allergies:   Calcium channel blockers, Codeine, Lyrica [pregabalin], Other, Ramipril, Ticlid [ticlopidine hcl], Morphine and related, Tape, Digoxin, Digoxin and related, Diltiazem hcl, Diltiazem hcl er beads, Metformin hcl, Metolazone, Milk-related compounds, Morphine sulfate, Myrbetriq [mirabegron], Nsaids, Onglyza [saxagliptin], Penicillin v, Penicillins, Spironolactone, Sulfa antibiotics, Tetracycline hcl, Tetracyclines & related, Ticlopidine, Verapamil, Vicodin [hydrocodone-acetaminophen], and Latex   Social History   Socioeconomic History   Marital status: Married    Spouse name: [name]   Number of children: 1   Years of education: MA   Highest education level: Not on file  Occupational History    Employer: UNEMPLOYED    Comment: Disablity  Tobacco Use   Smoking status: Never   Smokeless tobacco: Never  Vaping Use   Vaping Use: Never used  Substance and Sexual Activity   Alcohol use: No    Alcohol/week: 0.0 standard drinks   Drug use: No   Sexual activity: Yes    Birth control/protection: Surgical  Other Topics Concern   Not on file  Social History Narrative   Patient lives at home with spouse.     Daughter name is Tourist information centre manager.   Caffeine Use: none   Social Determinants of Radio broadcast assistant Strain: Not on file  Food Insecurity: Not on file  Transportation Needs: Not on file  Physical Activity: Not on file  Stress: Not on file  Social Connections: Not on file     Family History: The patient's family history includes Diabetes in her brother, father, and  paternal grandmother; Heart attack in her brother, father, and mother;  Heart disease in her brother, father, and mother; Hypertension in her father and mother; Kidney failure in her brother and father; Stroke in her father and mother.  ROS:   Please see the history of present illness.    She is somewhat depressed and tearful concerning her current quality of life and the stress associated.  All other systems reviewed and are negative.  EKGs/Labs/Other Studies Reviewed:    The following studies were reviewed today: No new or recent imaging.  EKG:  EKG a new tracing is not obtained.  Recent Labs: No results found for requested labs within last 8760 hours.  Recent Lipid Panel    Component Value Date/Time   CHOL 179 04/06/2019 1510   TRIG 129 04/06/2019 1510   HDL 61 04/06/2019 1510   CHOLHDL 2.9 04/06/2019 1510   CHOLHDL 3.2 01/02/2016 0730   VLDL 26 01/02/2016 0730   LDLCALC 92 04/06/2019 1510    Physical Exam:    VS:  BP 116/68    Pulse 77    Ht 5\' 4"  (1.626 m)    Wt 130 lb (59 kg)    SpO2 99%    BMI 22.31 kg/m     Wt Readings from Last 3 Encounters:  11/13/21 130 lb (59 kg)  09/17/21 131 lb 6.4 oz (59.6 kg)  03/24/21 131 lb 9.6 oz (59.7 kg)     GEN: Appears younger than his stated age.. No acute distress HEENT: Normal NECK: No JVD. LYMPHATICS: No lymphadenopathy CARDIAC: Soft systolic murmur.  No diastolic murmur. RRR S4 gallop without peripheral oedema. VASCULAR:  Normal Pulses. No bruits. RESPIRATORY:  Clear to auscultation without rales, wheezing or rhonchi  ABDOMEN: Soft, non-tender, non-distended, No pulsatile mass, MUSCULOSKELETAL: No deformity  SKIN: Warm and dry NEUROLOGIC:  Alert and oriented x 3 PSYCHIATRIC:  Normal affect   ASSESSMENT:    1. Chronic systolic heart failure ([place])   2. Coronary artery disease involving native coronary artery of native heart without angina pectoris   3. Biventricular implantable cardioverter-defibrillator in situ   4.  Essential hypertension   5. Type 2 diabetes mellitus without complication, with long-term current use of insulin (HCC)   6. Stage 3b chronic kidney disease ([place])   7. Mixed hyperlipidemia    PLAN:    In order of problems listed above:  Systolic heart failure regimen includes long-acting nitrates, hydralazine beta-blocker, ACE inhibitor, and furosemide.  Renal insufficiency limits therapeutic options. Secondary prevention discussed. She is followed in the device clinic. Blood pressure control is covered by heart failure therapy.  She is also on doxazosin. I would love to consider SGLT2 therapy but with elevated creatinine this is probably not a good idea.  I would like to have that question addressed by Dr. Posey Pronto. Consider SGLT2 therapy if safe. Continue high intensity statin therapy.  Overall education and awareness concerning primary/secondary risk prevention was discussed in detail: LDL less than 70, hemoglobin A1c less than 7, blood pressure target less than 130/80 mmHg, >150 minutes of moderate aerobic activity per week, avoidance of smoking, weight control (via diet and exercise), and continued surveillance/management of/for obstructive sleep apnea.    Medication Adjustments/Labs and Tests Ordered: Current medicines are reviewed at length with the patient today.  Concerns regarding medicines are outlined above.  No orders of the defined types were placed in this encounter.  No orders of the defined types were placed in this encounter.   Patient Instructions  Medication Instructions:  Your physician recommends that you continue on  your current medications as directed. Please refer to the Current Medication list given to you today.  *If you need a refill on your cardiac medications before your next appointment, please call your pharmacy*   Lab Work: None If you have labs (blood work) drawn today and your tests are completely normal, you will receive your results only  by: [place] (if you have MyChart) OR A paper copy in the mail If you have any lab test that is abnormal or we need to change your treatment, we will call you to review the results.   Testing/Procedures: None   Follow-Up: At [facility], you and your health needs are our priority.  As part of our continuing mission to provide you with exceptional heart care, we have created designated Provider Care Teams.  These Care Teams include your primary Cardiologist (physician) and Advanced Practice Providers (APPs -  Physician Assistants and Nurse Practitioners) who all work together to provide you with the care you need, when you need it.  We recommend signing up for the patient portal called "MyChart".  Sign up information is provided on this After Visit Summary.  MyChart is used to connect with patients for Virtual Visits (Telemedicine).  Patients are able to view lab/test results, encounter notes, upcoming appointments, etc.  Non-urgent messages can be sent to your provider as well.   To learn more about what you can do with MyChart, go to [email or web address].    Your next appointment:   6-8 month(s)  The format for your next appointment:   In Person  Provider:   Sinclair Grooms, MD     Other Instructions     Signed, Sinclair Grooms, MD  11/13/2021 4:03 PM    [place]

## 2021-11-21 ENCOUNTER — Other Ambulatory Visit: Payer: Self-pay | Admitting: Interventional Cardiology

## 2021-11-23 NOTE — Progress Notes (Signed)
Remote ICD transmission.   

## 2021-12-18 DIAGNOSIS — I2510 Atherosclerotic heart disease of native coronary artery without angina pectoris: Secondary | ICD-10-CM | POA: Diagnosis not present

## 2021-12-18 DIAGNOSIS — Z833 Family history of diabetes mellitus: Secondary | ICD-10-CM | POA: Diagnosis not present

## 2021-12-18 DIAGNOSIS — N184 Chronic kidney disease, stage 4 (severe): Secondary | ICD-10-CM | POA: Diagnosis not present

## 2021-12-18 DIAGNOSIS — E1151 Type 2 diabetes mellitus with diabetic peripheral angiopathy without gangrene: Secondary | ICD-10-CM | POA: Diagnosis not present

## 2021-12-18 DIAGNOSIS — Z8673 Personal history of transient ischemic attack (TIA), and cerebral infarction without residual deficits: Secondary | ICD-10-CM | POA: Diagnosis not present

## 2021-12-29 ENCOUNTER — Telehealth: Payer: Self-pay

## 2021-12-29 ENCOUNTER — Other Ambulatory Visit: Payer: Self-pay

## 2021-12-29 ENCOUNTER — Encounter: Payer: Self-pay | Admitting: Internal Medicine

## 2021-12-29 ENCOUNTER — Ambulatory Visit: Payer: Medicare Other | Admitting: Internal Medicine

## 2021-12-29 VITALS — BP 110/66 | HR 79 | Ht 64.0 in | Wt 129.6 lb

## 2021-12-29 DIAGNOSIS — R072 Precordial pain: Secondary | ICD-10-CM | POA: Diagnosis not present

## 2021-12-29 DIAGNOSIS — I255 Ischemic cardiomyopathy: Secondary | ICD-10-CM | POA: Diagnosis not present

## 2021-12-29 DIAGNOSIS — Z95810 Presence of automatic (implantable) cardiac defibrillator: Secondary | ICD-10-CM

## 2021-12-29 NOTE — Telephone Encounter (Signed)
The patient states she had a pain near her pacemaker site around 3 am this morning. She also states she had pain about 30 minutes ago as well. She states she felt a little lightheaded and nauseated. I spoke with [place] and Dr. Lovena Le nurse Sonia Baller and they are on agreement that she should come in today.   [place] scheduled her to come in today at 2:30 pm.   I ordered the patient a new handheld for her monitor and she should receive it in 7-10 business days.

## 2021-12-29 NOTE — Progress Notes (Signed)
HPI [name] returns today for followup. She is a pleasant 71 yo woman with CAD, s/p MI, with an ICM, s/p Biv ICD insertion. She has had trouble with weight gain and then was placed on Ozempic. She has lost almost 40 lbs. She feels well. She appears much younger than her stated age. She also notes stage 4 renal failure.   She comes in today for an unscheduled visit as she awoke with chest pain, close to her ICD generator. No fever or chills or swelling.  Allergies  Allergen Reactions   Calcium Channel Blockers Other (See Comments)    Cannot take non-DHP CCBs (diltiazem, verapamil) due to CHF requiring ICD   Codeine Anaphylaxis and Other (See Comments)   Lyrica [Pregabalin]     Nightmares, and swelling   Other Anaphylaxis and Other (See Comments)    Walnuts, pecans, and ANY melons PATIENT IS A VEGETARIAN    Ramipril Anaphylaxis, Swelling and Other (See Comments)   Ticlid [Ticlopidine Hcl] Other (See Comments)    Unprovoked bleeding while on Ticlid (doesn't think she was on ASA at the time) Takes Plavix without problems   Morphine And Related Other (See Comments)    Severe AMS, confusion, weakness Tolerates Fentanyl, Tramadol   Tape Dermatitis and Rash    Tolerates paper tape   Digoxin Other (See Comments)   Digoxin And Related Nausea Only    Unknown   Diltiazem Hcl Other (See Comments)   Diltiazem Hcl Er Beads Other (See Comments)   Metformin Hcl Other (See Comments)   Metolazone Other (See Comments)    Cannot remember reaction   Milk-Related Compounds Other (See Comments)    Bad gas   Morphine Sulfate Other (See Comments)   Myrbetriq [Mirabegron] Other (See Comments)    Aggravates migraines   Nsaids Other (See Comments)   Onglyza [Saxagliptin] Other (See Comments)    Exacerbates migraines   Penicillin V Other (See Comments)   Penicillins Hives    Has patient had a PCN reaction causing immediate rash, facial/tongue/throat swelling, SOB or lightheadedness with  hypotension: Yes Has patient had a PCN reaction causing severe rash involving mucus membranes or skin necrosis: Unknown Has patient had a PCN reaction that required hospitalization: Unknown Has patient had a PCN reaction occurring within the last 10 years: No If all of the above answers are "NO", then may proceed with Cephalosporin use.    Spironolactone Other (See Comments)    Cannot remember reaction   Sulfa Antibiotics Hives and Other (See Comments)   Tetracycline Hcl Other (See Comments)   Tetracyclines & Related Other (See Comments)    Cannot remember reaction Tolerates macrolides (azithromycin)   Ticlopidine Other (See Comments)   Verapamil Other (See Comments)    Pacemaker/CHF   Vicodin [Hydrocodone-Acetaminophen] Other (See Comments)    EXTREME LETHARGY   Latex Itching, Rash and Other (See Comments)     Current Outpatient Medications  Medication Sig Dispense Refill   albuterol (PROVENTIL HFA;VENTOLIN HFA) 108 (90 BASE) MCG/ACT inhaler Inhale 2 puffs into the lungs every 4 (four) hours as needed for wheezing or shortness of breath.     Alcohol Swabs (B-D SINGLE USE SWABS REGULAR) PADS      ALPRAZolam (XANAX) 1 MG tablet Take 1 mg by mouth 3 (three) times daily.   2   Artificial Tear GEL Place 2 drops into both eyes daily as needed (dry eyes).      aspirin 325 MG EC tablet Take 325  mg by mouth daily.     benzonatate (TESSALON) 100 MG capsule Take 1 capsule (100 mg total) by mouth 3 (three) times daily as needed for cough. 30 capsule 0   BIOTIN PO Take by mouth daily.     cetirizine (ZYRTEC) 10 MG tablet Take 10 mg by mouth daily.     Cholecalciferol (VITAMIN D-3) 1000 UNITS CAPS Take 1,000 Units by mouth daily.      clopidogrel (PLAVIX) 75 MG tablet TAKE 1 TABLET BY MOUTH  DAILY 90 tablet 1   cyclobenzaprine (FLEXERIL) 5 MG tablet Take 1 tablet (5 mg total) by mouth at bedtime as needed for muscle spasms. 30 tablet 0   docusate sodium (COLACE) 250 MG capsule Take 250 mg by  mouth daily.     doxazosin (CARDURA) 4 MG tablet Take 4 mg by mouth at bedtime.      DROPLET PEN NEEDLES 32G X 4 MM MISC      EPINEPHrine 0.3 mg/0.3 mL IJ SOAJ injection Inject 0.3 mg into the muscle as needed for anaphylaxis.   3   ezetimibe (ZETIA) 10 MG tablet TAKE 1 TABLET BY MOUTH  DAILY 90 tablet 1   Flaxseed, Linseed, (FLAXSEED OIL) 1200 MG CAPS Take 1,200 mg by mouth daily.     fluticasone (FLONASE) 50 MCG/ACT nasal spray Place 2 sprays into both nostrils daily as needed for allergies or rhinitis.     furosemide (LASIX) 80 MG tablet TAKE 1 TABLET TWICE A DAY. MAY TAKE AN ADDITIONAL 80MG  (1 TABLET) AS NEEDED FOR EDEMA 270 tablet 1   hydrALAZINE (APRESOLINE) 25 MG tablet TAKE 1 TABLET BY MOUTH  TWICE DAILY 180 tablet 3   isosorbide mononitrate (IMDUR) 60 MG 24 hr tablet TAKE 1 TABLET BY MOUTH  DAILY 90 tablet 3   Ketotifen Fumarate (ALAWAY OP) Place 1 drop into both eyes daily as needed (allergies).      LANTUS SOLOSTAR 100 UNIT/ML Solostar Pen Inject 6 Units into the skin at bedtime.      metoprolol succinate (TOPROL-XL) 100 MG 24 hr tablet TAKE 2 TABLETS BY MOUTH  DAILY TAKE WITH OR  IMMEDIATELY FOLLOWING A  MEAL 180 tablet 3   Multiple Vitamin (MULTIVITAMIN WITH MINERALS) TABS Take 1 tablet by mouth daily.     nitroGLYCERIN (NITROSTAT) 0.4 MG SL tablet Place 1 tablet under the tongue every 5 minutes as needed for chest pain, max 3 doses, go to er if no relief 25 tablet 2   olmesartan (BENICAR) 40 MG tablet TAKE 1 TABLET BY MOUTH  DAILY 90 tablet 3   ondansetron (ZOFRAN) 8 MG tablet Take 8 mg by mouth every 8 (eight) hours as needed for nausea or vomiting.      OZEMPIC, 0.25 OR 0.5 MG/DOSE, 2 MG/1.5ML SOPN Inject 0.25 mg into the skin once a week. Sunday     pantoprazole (PROTONIX) 40 MG tablet TAKE 1 TABLET BY MOUTH  DAILY 90 tablet 3   potassium chloride (KLOR-CON M) 10 MEQ tablet TAKE 2 TABLETS BY MOUTH  TWICE DAILY 360 tablet 1   simvastatin (ZOCOR) 40 MG tablet TAKE 1 TABLET BY MOUTH  AT  BEDTIME 90 tablet 3   sodium chloride (MURO 128) 5 % ophthalmic ointment Place 1 drop into both eyes at bedtime. For dry eyes     traMADol (ULTRAM) 50 MG tablet Take 100 mg by mouth every 6 (six) hours as needed (MIGRAINES).     TRUE METRIX BLOOD GLUCOSE TEST test strip  TRUEplus Lancets 33G MISC      venlafaxine (EFFEXOR) 100 MG tablet Take 100-200 mg by mouth See admin instructions. TAKE 200 mg by mouth in the morning and take 100 mg at noon     vitamin A 7500 UNIT capsule Take 2,400 Units by mouth daily.     No current facility-administered medications for this visit.     Past Medical History:  Diagnosis Date   AICD (automatic cardioverter/defibrillator) present    [employer]- Dr. Beckie Salts follows   Anginal pain ([place])    Anxiety    Asthma    Back pain    "pinched nerve-lower back" - Dr. Nelva Bush follows.   Biventricular implantable cardioverter-defibrillator in situ 06/18/2011   Cerebral infarction ([place]) 11/11/2012   Cervical dysplasia    Chronic diastolic heart failure ([place]) 03/22/2016   Chronic kidney disease    Dr. Posey Pronto follows.   Chronic renal insufficiency, stage III (moderate) (HCC) 1/0/9323   Complication of anesthesia    "I wake up during surgeries" (02/14/2013)   Coronary artery disease involving coronary bypass graft of native heart with angina pectoris ([place]) 06/18/2011   Depression    DM (diabetes mellitus) ([place]) 11/11/2012   Fibroid    Function kidney decreased    GERD (gastroesophageal reflux disease)    Hemiplegia, unspecified, affecting nondominant side 11/11/2012   Hepatitis    Hepatitis A -college yrs"water source exposure"   Hiatal hernia    History of shingles    2-3 yrs ago last out break "around waist"   History of stomach ulcers    Hyperlipidemia 07/30/2016   Hypertension    ICD (implantable cardiac defibrillator) in place    Iron deficiency anemia    Ischemic cardiomyopathy    status post biventricular ICD placed by DR Edumunds who used to see Dr  Melvern Banker here to establish  cardiovascular care.   Migraines    MVP (mitral valve prolapse)    Antibiotics not required for procedures   Myocardial infarction ([place])    "I've had 2; the others they were able to catch before completing" (02/14/2013)   Pacemaker    Paroxysmal SVT (supraventricular tachycardia) ([place]) 06/18/2011   Pneumonia 1950's & 1985   Shortness of breath    "lying down flat; at times w/exertion" (02/14/2013). 10-06-15 exertion only..   Stroke [facility])    "2 confirmed; 9 TIA's; results in dragging LLE; numbness in tip of tongue" (02/14/2013),10-06-15 right hand tends to be weaker when tired.   TIA (transient ischemic attack) 11/11/2012   Type II diabetes mellitus ([place])     ROS:   All systems reviewed and negative except as noted in the HPI.   Past Surgical History:  Procedure Laterality Date   ABDOMINAL HYSTERECTOMY  1985   TAH    BIV ICD [place] CHANGE OUT  06/2007; 04/2008   "2 lead initial placement, at [facility]; done at [facility], after developing CHF" (02/14/2013)   BIV PACEMAKER GENERATOR CHANGE OUT N/A 01/29/2015   Procedure: BIV PACEMAKER GENERATOR CHANGE OUT;  Surgeon: Evans Lance, MD;  Location: [facility] CATH LAB;  Service: Cardiovascular;  Laterality: N/A;   BREAST EXCISIONAL BIOPSY Left 01/2007; 06/2007; 03/2008   "benign" (02/14/2013)   BREAST SURGERY     CARDIAC CATHETERIZATION     "probably in the teens" (02/14/2013)   CARDIAC DEFIBRILLATOR PLACEMENT     COLONOSCOPY  ~ 2002   COLONOSCOPY WITH PROPOFOL N/A 10/07/2015   Procedure: COLONOSCOPY WITH PROPOFOL;  Surgeon: Juanita Craver, MD;  Location: [facility]  ENDOSCOPY;  Service: Endoscopy;  Laterality: N/A;   CORONARY ANGIOPLASTY WITH STENT PLACEMENT     "started out w/5; bypass corrected some; 1 stent since the bypass" (02/14/2013)   CORONARY ARTERY BYPASS GRAFT  ` 1998   LIMA-LAD, SVG-D1, SVG-PDA   DILATION AND CURETTAGE OF UTERUS  1975 X 2; 1976; 1977   INSERT / REPLACE / REMOVE PACEMAKER     biventricular defibrillator--06/10/ 2009   LEFT  HEART CATH AND CORS/GRAFTS ANGIOGRAPHY N/A 08/04/2018   Procedure: LEFT HEART CATH AND CORS/GRAFTS ANGIOGRAPHY;  Surgeon: Burnell Blanks, MD;  Location: [facility] CV LAB;  Service: Cardiovascular;  Laterality: N/A;   LEFT HEART CATHETERIZATION WITH CORONARY/GRAFT ANGIOGRAM N/A 01/03/2015   Procedure: LEFT HEART CATHETERIZATION WITH Beatrix Fetters;  Surgeon: Sinclair Grooms, MD;  Location: [facility] CATH LAB;  Service: Cardiovascular;  Laterality: N/A;   SUPRAVENTRICULAR TACHYCARDIA ABLATION  06/2007   TEE WITHOUT CARDIOVERSION  11/14/2012   Procedure: TRANSESOPHAGEAL ECHOCARDIOGRAM (TEE);  Surgeon: Candee Furbish, MD;  Location: [facility] ENDOSCOPY;  Service: Cardiovascular;  Laterality: N/A;     Family History  Problem Relation Age of Onset   Heart disease Mother    Hypertension Mother    Heart attack Mother    Stroke Mother    Heart disease Father    Hypertension Father    Diabetes Father    Kidney failure Father    Heart attack Father    Stroke Father    Heart disease Brother    Diabetes Brother    Kidney failure Brother    Heart attack Brother    Diabetes Paternal Grandmother      Social History   Socioeconomic History   Marital status: Married    Spouse name: [name]   Number of children: 1   Years of education: MA   Highest education level: Not on file  Occupational History    Employer: UNEMPLOYED    Comment: Disablity  Tobacco Use   Smoking status: Never   Smokeless tobacco: Never  Vaping Use   Vaping Use: Never used  Substance and Sexual Activity   Alcohol use: No    Alcohol/week: 0.0 standard drinks   Drug use: No   Sexual activity: Yes    Birth control/protection: Surgical  Other Topics Concern   Not on file  Social History Narrative   Patient lives at home with spouse.     Daughter name is Tourist information centre manager.   Caffeine Use: none   Social Determinants of Radio broadcast assistant Strain: Not on file  Food Insecurity: Not on file  Transportation Needs:  Not on file  Physical Activity: Not on file  Stress: Not on file  Social Connections: Not on file  Intimate Partner Violence: Not on file     BP 110/66    Pulse 79    Ht 5\' 4"  (1.626 m)    Wt 129 lb 9.6 oz (58.8 kg)    SpO2 97%    BMI 22.25 kg/m   Physical Exam:  Well appearing NAD HEENT: Unremarkable Neck:  No JVD, no thyromegally Lymphatics:  No adenopathy Back:  No CVA tenderness Lungs:  Clear HEART:  Regular rate rhythm, no murmurs, no rubs, no clicks Abd:  soft, positive bowel sounds, no organomegally, no rebound, no guarding Ext:  2 plus pulses, no edema, no cyanosis, no clubbing Skin:  No rashes no nodules Neuro:  CN II through XII intact, motor grossly intact  EKG - nsr with biv pacing  DEVICE  Normal device function.  See PaceArt for details.   Assess/Plan:  Chronic systolic heart failure - her symptoms are class 2. She will continue her current meds. She will maintain a low sodium diet. ICD - her medtronic BIV ICD is working normally.  HTN - her bp is well controlled. She will maintain a low sodium diet. 4. Weight loss - she has lost almost 40 lbs. I encouraged her to keep her weight down. 5. Chest pain - Her symptoms are non-cardiac. She is tender on the medial side of her ICD but has no erythema, swelling or fluctuance. She will undergo watchful waiting. Discussed with Dr. Tamala Julian.    Royston Sinner Adelfo Diebel,MD

## 2021-12-29 NOTE — Patient Instructions (Addendum)
Medication Instructions:  Your physician recommends that you continue on your current medications as directed. Please refer to the Current Medication list given to you today.  Labwork: None ordered.  Testing/Procedures: Your physician has requested that you have a lexiscan myoview.   Please schedule stress test.  Follow-Up: Your physician wants you to follow-up as previously scheduled  Remote monitoring is used to monitor your ICD from home. This monitoring reduces the number of office visits required to check your device to one time per year. It allows [place] to keep an eye on the functioning of your device to ensure it is working properly. You are scheduled for a device check from home on 02/11/2022. You may send your transmission at any time that day. If you have a wireless device, the transmission will be sent automatically. After your physician reviews your transmission, you will receive a postcard with your next transmission date.  Any Other Special Instructions Will Be Listed Below (If Applicable).  If you need a refill on your cardiac medications before your next appointment, please call your pharmacy.

## 2021-12-30 ENCOUNTER — Telehealth: Payer: Self-pay

## 2021-12-30 LAB — CUP PACEART INCLINIC DEVICE CHECK
Battery Remaining Longevity: 23 mo
Battery Voltage: 2.91 V
Brady Statistic AP VP Percent: 0.03 %
Brady Statistic AP VS Percent: 0.02 %
Brady Statistic AS VP Percent: 98.62 %
Brady Statistic AS VS Percent: 1.33 %
Brady Statistic RA Percent Paced: 0.05 %
Brady Statistic RV Percent Paced: 0.15 %
Date Time Interrogation Session: 20230221162300
HighPow Impedance: 38 Ohm
HighPow Impedance: 52 Ohm
Implantable Lead Implant Date: 20090610
Implantable Lead Implant Date: 20090610
Implantable Lead Implant Date: 20090610
Implantable Lead Location: 753858
Implantable Lead Location: 753859
Implantable Lead Location: 753860
Implantable Lead Model: 4194
Implantable Lead Model: 5076
Implantable Lead Model: 6947
Implantable Pulse Generator Implant Date: 20160323
Lead Channel Impedance Value: 247 Ohm
Lead Channel Impedance Value: 361 Ohm
Lead Channel Impedance Value: 456 Ohm
Lead Channel Impedance Value: 456 Ohm
Lead Channel Impedance Value: 532 Ohm
Lead Channel Impedance Value: 703 Ohm
Lead Channel Pacing Threshold Amplitude: 0.75 V
Lead Channel Pacing Threshold Amplitude: 0.75 V
Lead Channel Pacing Threshold Amplitude: 1 V
Lead Channel Pacing Threshold Pulse Width: 0.4 ms
Lead Channel Pacing Threshold Pulse Width: 0.4 ms
Lead Channel Pacing Threshold Pulse Width: 0.4 ms
Lead Channel Sensing Intrinsic Amplitude: 2 mV
Lead Channel Sensing Intrinsic Amplitude: 3 mV
Lead Channel Sensing Intrinsic Amplitude: 4.25 mV
Lead Channel Sensing Intrinsic Amplitude: 5.625 mV
Lead Channel Setting Pacing Amplitude: 1.75 V
Lead Channel Setting Pacing Amplitude: 2 V
Lead Channel Setting Pacing Amplitude: 2.75 V
Lead Channel Setting Pacing Pulse Width: 0.4 ms
Lead Channel Setting Pacing Pulse Width: 0.4 ms
Lead Channel Setting Sensing Sensitivity: 0.3 mV

## 2021-12-30 NOTE — Telephone Encounter (Signed)
Outreach made to Pt.  Advised up on further discussion with Dr. Suanne Marker not recommend a stress test is needed at this time.  Pt indicates understanding and agreement.  Stress test cancelled.

## 2022-01-04 DIAGNOSIS — Z95810 Presence of automatic (implantable) cardiac defibrillator: Secondary | ICD-10-CM | POA: Diagnosis not present

## 2022-01-04 DIAGNOSIS — E1121 Type 2 diabetes mellitus with diabetic nephropathy: Secondary | ICD-10-CM | POA: Diagnosis not present

## 2022-01-04 DIAGNOSIS — I10 Essential (primary) hypertension: Secondary | ICD-10-CM | POA: Diagnosis not present

## 2022-01-04 DIAGNOSIS — N184 Chronic kidney disease, stage 4 (severe): Secondary | ICD-10-CM | POA: Diagnosis not present

## 2022-01-04 DIAGNOSIS — Z7985 Long-term (current) use of injectable non-insulin antidiabetic drugs: Secondary | ICD-10-CM | POA: Diagnosis not present

## 2022-01-04 DIAGNOSIS — I2510 Atherosclerotic heart disease of native coronary artery without angina pectoris: Secondary | ICD-10-CM | POA: Diagnosis not present

## 2022-01-07 ENCOUNTER — Encounter (HOSPITAL_COMMUNITY): Payer: Medicare Other

## 2022-01-21 ENCOUNTER — Other Ambulatory Visit: Payer: Self-pay | Admitting: Interventional Cardiology

## 2022-01-27 ENCOUNTER — Other Ambulatory Visit: Payer: Self-pay | Admitting: *Deleted

## 2022-01-27 ENCOUNTER — Other Ambulatory Visit: Payer: Self-pay

## 2022-01-27 DIAGNOSIS — N184 Chronic kidney disease, stage 4 (severe): Secondary | ICD-10-CM | POA: Diagnosis not present

## 2022-01-27 MED ORDER — ISOSORBIDE MONONITRATE ER 60 MG PO TB24: 60.0000 mg | ORAL_TABLET | Freq: Every day | ORAL | 3 refills | Status: DC

## 2022-01-27 MED ORDER — CLOPIDOGREL BISULFATE 75 MG PO TABS: 75.0000 mg | ORAL_TABLET | Freq: Every day | ORAL | 3 refills | Status: DC

## 2022-01-27 MED ORDER — HYDRALAZINE HCL 25 MG PO TABS: 25.0000 mg | ORAL_TABLET | Freq: Two times a day (BID) | ORAL | 3 refills | Status: DC

## 2022-01-27 MED ORDER — METOPROLOL SUCCINATE ER 100 MG PO TB24: ORAL_TABLET | ORAL | 3 refills | Status: DC

## 2022-01-27 MED ORDER — FUROSEMIDE 80 MG PO TABS
ORAL_TABLET | ORAL | 3 refills | Status: DC
Start: 2022-01-27 — End: 2022-12-01

## 2022-01-27 MED ORDER — EZETIMIBE 10 MG PO TABS: 10.0000 mg | ORAL_TABLET | Freq: Every day | ORAL | 3 refills | Status: DC

## 2022-01-29 MED ORDER — NITROGLYCERIN 0.4 MG SL SUBL: SUBLINGUAL_TABLET | SUBLINGUAL | 2 refills | Status: AC

## 2022-01-29 MED ORDER — METOPROLOL SUCCINATE ER 100 MG PO TB24: 200.0000 mg | ORAL_TABLET | Freq: Every day | ORAL | 3 refills | Status: DC

## 2022-01-29 NOTE — Addendum Note (Signed)
Addended by: Carter Kitten D on: 01/29/2022 10:13 AM ? ? Modules accepted: Orders ? ?

## 2022-02-01 ENCOUNTER — Telehealth: Payer: Self-pay | Admitting: Internal Medicine

## 2022-02-01 NOTE — Telephone Encounter (Signed)
Patient called in reporting her bedside monitor is not working. It is flashing with the number 2300 caution. Attempted to contact device clinic, but did not receive an answer due to it being after 5:00 PM. Advised patient to contact her monitor company, which is [employer] to report this. Patient verbalized understanding advised I would send a telephone note notifying the device clinic to return her call tomorrow.  ?

## 2022-02-02 DIAGNOSIS — N2581 Secondary hyperparathyroidism of renal origin: Secondary | ICD-10-CM | POA: Diagnosis not present

## 2022-02-02 DIAGNOSIS — I129 Hypertensive chronic kidney disease with stage 1 through stage 4 chronic kidney disease, or unspecified chronic kidney disease: Secondary | ICD-10-CM | POA: Diagnosis not present

## 2022-02-02 DIAGNOSIS — D631 Anemia in chronic kidney disease: Secondary | ICD-10-CM | POA: Diagnosis not present

## 2022-02-02 DIAGNOSIS — N184 Chronic kidney disease, stage 4 (severe): Secondary | ICD-10-CM | POA: Diagnosis not present

## 2022-02-03 NOTE — Telephone Encounter (Signed)
I spoke with the patient and she states she did call [employer] about her monitor. I asked for a transmission to make sure that it is working. The patient agreed to send one. I told her I will call her in the morning if I do not see the transmission. ?

## 2022-02-04 NOTE — Telephone Encounter (Signed)
Transmission received. Monitor working properly. ?

## 2022-02-10 DIAGNOSIS — Z636 Dependent relative needing care at home: Secondary | ICD-10-CM | POA: Diagnosis not present

## 2022-02-10 DIAGNOSIS — F411 Generalized anxiety disorder: Secondary | ICD-10-CM | POA: Diagnosis not present

## 2022-02-10 DIAGNOSIS — F331 Major depressive disorder, recurrent, moderate: Secondary | ICD-10-CM | POA: Diagnosis not present

## 2022-02-11 ENCOUNTER — Ambulatory Visit (INDEPENDENT_AMBULATORY_CARE_PROVIDER_SITE_OTHER): Payer: Medicare HMO

## 2022-02-11 DIAGNOSIS — I5022 Chronic systolic (congestive) heart failure: Secondary | ICD-10-CM

## 2022-02-11 DIAGNOSIS — I255 Ischemic cardiomyopathy: Secondary | ICD-10-CM | POA: Diagnosis not present

## 2022-02-12 LAB — CUP PACEART REMOTE DEVICE CHECK
Battery Remaining Longevity: 21 mo
Battery Voltage: 2.91 V
Brady Statistic AP VP Percent: 0.03 %
Brady Statistic AP VS Percent: 0.02 %
Brady Statistic AS VP Percent: 98.64 %
Brady Statistic AS VS Percent: 1.31 %
Brady Statistic RA Percent Paced: 0.05 %
Brady Statistic RV Percent Paced: 0.09 %
Date Time Interrogation Session: 20230407110055
HighPow Impedance: 38 Ohm
HighPow Impedance: 47 Ohm
Implantable Lead Implant Date: 20090610
Implantable Lead Implant Date: 20090610
Implantable Lead Implant Date: 20090610
Implantable Lead Location: 753858
Implantable Lead Location: 753859
Implantable Lead Location: 753860
Implantable Lead Model: 4194
Implantable Lead Model: 5076
Implantable Lead Model: 6947
Implantable Pulse Generator Implant Date: 20160323
Lead Channel Impedance Value: 247 Ohm
Lead Channel Impedance Value: 361 Ohm
Lead Channel Impedance Value: 418 Ohm
Lead Channel Impedance Value: 456 Ohm
Lead Channel Impedance Value: 513 Ohm
Lead Channel Impedance Value: 646 Ohm
Lead Channel Pacing Threshold Amplitude: 0.875 V
Lead Channel Pacing Threshold Amplitude: 0.875 V
Lead Channel Pacing Threshold Amplitude: 1.625 V
Lead Channel Pacing Threshold Pulse Width: 0.4 ms
Lead Channel Pacing Threshold Pulse Width: 0.4 ms
Lead Channel Pacing Threshold Pulse Width: 0.4 ms
Lead Channel Sensing Intrinsic Amplitude: 1.875 mV
Lead Channel Sensing Intrinsic Amplitude: 1.875 mV
Lead Channel Sensing Intrinsic Amplitude: 4.625 mV
Lead Channel Sensing Intrinsic Amplitude: 4.625 mV
Lead Channel Setting Pacing Amplitude: 1.75 V
Lead Channel Setting Pacing Amplitude: 2 V
Lead Channel Setting Pacing Amplitude: 2.75 V
Lead Channel Setting Pacing Pulse Width: 0.4 ms
Lead Channel Setting Pacing Pulse Width: 0.4 ms
Lead Channel Setting Sensing Sensitivity: 0.3 mV

## 2022-02-26 NOTE — Progress Notes (Signed)
Remote ICD transmission.   

## 2022-03-23 DIAGNOSIS — E1122 Type 2 diabetes mellitus with diabetic chronic kidney disease: Secondary | ICD-10-CM | POA: Diagnosis not present

## 2022-03-23 DIAGNOSIS — Z6821 Body mass index (BMI) 21.0-21.9, adult: Secondary | ICD-10-CM | POA: Diagnosis not present

## 2022-03-23 DIAGNOSIS — N184 Chronic kidney disease, stage 4 (severe): Secondary | ICD-10-CM | POA: Diagnosis not present

## 2022-03-23 DIAGNOSIS — Z7984 Long term (current) use of oral hypoglycemic drugs: Secondary | ICD-10-CM | POA: Diagnosis not present

## 2022-03-23 DIAGNOSIS — Z0001 Encounter for general adult medical examination with abnormal findings: Secondary | ICD-10-CM | POA: Diagnosis not present

## 2022-03-23 DIAGNOSIS — Z636 Dependent relative needing care at home: Secondary | ICD-10-CM | POA: Diagnosis not present

## 2022-03-23 DIAGNOSIS — Z0000 Encounter for general adult medical examination without abnormal findings: Secondary | ICD-10-CM | POA: Diagnosis not present

## 2022-03-29 ENCOUNTER — Other Ambulatory Visit: Payer: Self-pay

## 2022-03-29 MED ORDER — OLMESARTAN MEDOXOMIL 40 MG PO TABS: 40.0000 mg | ORAL_TABLET | Freq: Every day | ORAL | 3 refills | Status: DC

## 2022-03-30 MED ORDER — OLMESARTAN MEDOXOMIL 40 MG PO TABS: 40.0000 mg | ORAL_TABLET | Freq: Every day | ORAL | 2 refills | Status: DC

## 2022-03-30 NOTE — Addendum Note (Signed)
Addended by: Carter Kitten D on: 03/30/2022 01:25 PM   Modules accepted: Orders

## 2022-04-06 ENCOUNTER — Encounter (HOSPITAL_COMMUNITY): Payer: Self-pay | Admitting: Emergency Medicine

## 2022-04-06 ENCOUNTER — Emergency Department (HOSPITAL_COMMUNITY): Payer: Medicare HMO

## 2022-04-06 ENCOUNTER — Emergency Department (HOSPITAL_COMMUNITY)
Admission: EM | Admit: 2022-04-06 | Discharge: 2022-04-06 | Disposition: A | Discharge status: Home / Self Care | Payer: Medicare HMO | Attending: Emergency Medicine | Admitting: Emergency Medicine

## 2022-04-06 ENCOUNTER — Other Ambulatory Visit: Payer: Self-pay

## 2022-04-06 DIAGNOSIS — Z7982 Long term (current) use of aspirin: Secondary | ICD-10-CM | POA: Insufficient documentation

## 2022-04-06 DIAGNOSIS — R29818 Other symptoms and signs involving the nervous system: Secondary | ICD-10-CM | POA: Diagnosis not present

## 2022-04-06 DIAGNOSIS — Z91040 Latex allergy status: Secondary | ICD-10-CM | POA: Diagnosis not present

## 2022-04-06 DIAGNOSIS — Z743 Need for continuous supervision: Secondary | ICD-10-CM | POA: Diagnosis not present

## 2022-04-06 DIAGNOSIS — R000 Tachycardia, unspecified: Secondary | ICD-10-CM | POA: Diagnosis not present

## 2022-04-06 DIAGNOSIS — Z79899 Other long term (current) drug therapy: Secondary | ICD-10-CM | POA: Insufficient documentation

## 2022-04-06 DIAGNOSIS — E119 Type 2 diabetes mellitus without complications: Secondary | ICD-10-CM | POA: Insufficient documentation

## 2022-04-06 DIAGNOSIS — I499 Cardiac arrhythmia, unspecified: Secondary | ICD-10-CM | POA: Diagnosis not present

## 2022-04-06 DIAGNOSIS — R0902 Hypoxemia: Secondary | ICD-10-CM | POA: Diagnosis not present

## 2022-04-06 DIAGNOSIS — Z7902 Long term (current) use of antithrombotics/antiplatelets: Secondary | ICD-10-CM | POA: Insufficient documentation

## 2022-04-06 DIAGNOSIS — I10 Essential (primary) hypertension: Secondary | ICD-10-CM | POA: Insufficient documentation

## 2022-04-06 DIAGNOSIS — G43409 Hemiplegic migraine, not intractable, without status migrainosus: Secondary | ICD-10-CM | POA: Insufficient documentation

## 2022-04-06 DIAGNOSIS — G43809 Other migraine, not intractable, without status migrainosus: Secondary | ICD-10-CM | POA: Diagnosis not present

## 2022-04-06 DIAGNOSIS — R0789 Other chest pain: Secondary | ICD-10-CM | POA: Insufficient documentation

## 2022-04-06 DIAGNOSIS — R079 Chest pain, unspecified: Secondary | ICD-10-CM | POA: Diagnosis not present

## 2022-04-06 LAB — TROPONIN I (HIGH SENSITIVITY)
Troponin I (High Sensitivity): 13 ng/L (ref <18)
Troponin I (High Sensitivity): 12 ng/L (ref <18)

## 2022-04-06 LAB — CBC
HCT: 32.2 % — ABNORMAL LOW (ref 36.0–46.0)
Hemoglobin: 11.1 g/dL — ABNORMAL LOW (ref 12.0–15.0)
MCH: 30 pg (ref 26.0–34.0)
MCHC: 34.5 g/dL (ref 30.0–36.0)
MCV: 87 fL (ref 80.0–100.0)
Platelets: 204 10*3/uL (ref 150–400)
RBC: 3.7 MIL/uL — ABNORMAL LOW (ref 3.87–5.11)
RDW: 12.2 % (ref 11.5–15.5)
WBC: 7.7 10*3/uL (ref 4.0–10.5)
nRBC: 0 % (ref 0.0–0.2)

## 2022-04-06 LAB — BASIC METABOLIC PANEL
Anion gap: 12 (ref 5–15)
BUN: 44 mg/dL — ABNORMAL HIGH (ref 8–23)
CO2: 28 mmol/L (ref 22–32)
Calcium: 9.5 mg/dL (ref 8.9–10.3)
Chloride: 98 mmol/L (ref 98–111)
Creatinine, Ser: 2.81 mg/dL — ABNORMAL HIGH (ref 0.44–1.00)
GFR, Estimated: 18 mL/min — ABNORMAL LOW (ref >60)
Glucose, Bld: 115 mg/dL — ABNORMAL HIGH (ref 70–99)
Potassium: 4.1 mmol/L (ref 3.5–5.1)
Sodium: 138 mmol/L (ref 135–145)

## 2022-04-06 MED ORDER — METOCLOPRAMIDE HCL 5 MG/ML IJ SOLN
5.0000 mg | Freq: Once | INTRAMUSCULAR | Status: AC
  Administered 2022-04-06: 5 mg via INTRAVENOUS
  Filled 2022-04-06: qty 2

## 2022-04-06 MED ORDER — DIPHENHYDRAMINE HCL 50 MG/ML IJ SOLN
12.5000 mg | Freq: Once | INTRAMUSCULAR | Status: AC
  Administered 2022-04-06: 12.5 mg via INTRAVENOUS
  Filled 2022-04-06: qty 1

## 2022-04-06 NOTE — ED Notes (Signed)
[employer] pacemaker interrogated °

## 2022-04-06 NOTE — ED Notes (Signed)
When doing hourly rounding on patient, patient alerted this RN that her L side of her face is feeling numb, complaining of a headache at this time. Dr. Regenia Skeeter notified.

## 2022-04-06 NOTE — Discharge Instructions (Signed)
If you develop recurrent, continued, or worsening chest pain, headache, weakness or numbness, shortness of breath, fever, vomiting, abdominal or back pain, or any other new/concerning symptoms then return to the ER for evaluation.

## 2022-04-06 NOTE — ED Provider Notes (Signed)
[facility] Provider Note   CSN: [id number] Arrival date & time: 04/06/22  1045     History  Chief Complaint  Patient presents with   Chest Pain    [name] is a 71 y.o. female.  HPI 71 year old female presents with acute chest pain.  She describes it as a tightness.  At 7:15 AM her sister-in-law called her and informed her that her brother has died.  About 15-30 minutes later she developed the chest pain.  She is also feeling some tingling and pain in her left arm.  She feels short of breath.  She has had some nausea.  EMS gave her aspirin.  Patient feels like she has had 3 shocks from her ICD device.  She thinks to her at home and 1 in the ambulance.  No leg swelling.  She has had this tightness/pain before and states she was told it was from a muscle in her chest.  Has multiple comorbidities and her past medical history which includes chronic heart failure, diabetes, renal insufficiency, mitral valve prolapse, and hypertension.  Home Medications Prior to Admission medications   Medication Sig Start Date End Date Taking? Authorizing Provider  ALPRAZolam Duanne Moron) 1 MG tablet Take 1 mg by mouth 3 (three) times daily.  09/20/18  Yes [provider]  aspirin 325 MG EC tablet Take 325 mg by mouth daily.   Yes [provider]  BIOTIN PO Take by mouth daily.   Yes [provider]  cetirizine (ZYRTEC) 10 MG tablet Take 10 mg by mouth daily.   Yes [provider]  Cholecalciferol (VITAMIN D-3) 1000 UNITS CAPS Take 1,000 Units by mouth daily.    Yes [provider]  clopidogrel (PLAVIX) 75 MG tablet Take 1 tablet (75 mg total) by mouth daily. 01/27/22  Yes Belva Crome, MD  doxazosin (CARDURA) 4 MG tablet Take 4 mg by mouth at bedtime.    Yes [provider]  ezetimibe (ZETIA) 10 MG tablet Take 1 tablet (10 mg total) by mouth daily. 01/27/22  Yes Belva Crome, MD  Flaxseed, Linseed,  (FLAXSEED OIL) 1200 MG CAPS Take 1,200 mg by mouth daily.   Yes [provider]  furosemide (LASIX) 80 MG tablet TAKE 1 TABLET TWICE A DAY. MAY TAKE AN ADDITIONAL '80MG'$  (1 TABLET) AS NEEDED FOR EDEMA 01/27/22  Yes Belva Crome, MD  hydrALAZINE (APRESOLINE) 25 MG tablet Take 1 tablet (25 mg total) by mouth 2 (two) times daily. 01/27/22  Yes Belva Crome, MD  isosorbide mononitrate (IMDUR) 60 MG 24 hr tablet Take 1 tablet (60 mg total) by mouth daily. 01/27/22  Yes Belva Crome, MD  LANTUS SOLOSTAR 100 UNIT/ML Solostar Pen Inject 6 Units into the skin at bedtime.  04/10/19  Yes [provider]  metoprolol succinate (TOPROL-XL) 100 MG 24 hr tablet Take 2 tablets (200 mg total) by mouth daily. Take with or immediately following a meal. 01/29/22  Yes Belva Crome, MD  Multiple Vitamin (MULTIVITAMIN WITH MINERALS) TABS Take 1 tablet by mouth daily.   Yes [provider]  olmesartan (BENICAR) 40 MG tablet Take 1 tablet (40 mg total) by mouth daily. 03/30/22  Yes Belva Crome, MD  OZEMPIC, 0.25 OR 0.5 MG/DOSE, 2 MG/1.5ML SOPN Inject 0.25 mg into the skin once a week. Sunday 08/30/19  Yes [provider]  pantoprazole (PROTONIX) 40 MG tablet TAKE 1 TABLET BY MOUTH  DAILY 09/02/21  Yes Tamala Julian,  Lynnell Dike, MD  potassium chloride (KLOR-CON M) 10 MEQ tablet TAKE 2 TABLETS BY MOUTH  TWICE DAILY 10/16/21  Yes Belva Crome, MD  simvastatin (ZOCOR) 40 MG tablet TAKE 1 TABLET BY MOUTH AT  BEDTIME 04/24/21  Yes Belva Crome, MD  traMADol (ULTRAM) 50 MG tablet Take 100 mg by mouth every 6 (six) hours as needed (MIGRAINES).   Yes [provider]  TRUE METRIX BLOOD GLUCOSE TEST test strip  02/27/19  Yes [provider]  venlafaxine (EFFEXOR) 100 MG tablet Take 100-200 mg by mouth See admin instructions. TAKE 200 mg by mouth in the morning and take 100 mg at noon 04/12/11  Yes [provider]  vitamin A 7500 UNIT capsule Take 2,400 Units by mouth daily.   Yes  [provider]  albuterol (PROVENTIL HFA;VENTOLIN HFA) 108 (90 BASE) MCG/ACT inhaler Inhale 2 puffs into the lungs every 4 (four) hours as needed for wheezing or shortness of breath.    [provider]  Alcohol Swabs (B-D SINGLE USE SWABS REGULAR) PADS  03/05/19   [provider]  Artificial Tear GEL Place 2 drops into both eyes daily as needed (dry eyes).     [provider]  benzonatate (TESSALON) 100 MG capsule Take 1 capsule (100 mg total) by mouth 3 (three) times daily as needed for cough. Patient not taking: Reported on 04/06/2022 04/24/21   Brunetta Jeans, PA-C  cyclobenzaprine (FLEXERIL) 5 MG tablet Take 1 tablet (5 mg total) by mouth at bedtime as needed for muscle spasms. 01/04/16   Oswald Hillock, MD  docusate sodium (COLACE) 250 MG capsule Take 250 mg by mouth daily. Patient not taking: Reported on 04/06/2022    [provider]  DROPLET PEN NEEDLES 32G X 4 MM MISC  03/05/19   [provider]  EPINEPHrine 0.3 mg/0.3 mL IJ SOAJ injection Inject 0.3 mg into the muscle as needed for anaphylaxis.  08/08/18   [provider]  fluticasone (FLONASE) 50 MCG/ACT nasal spray Place 2 sprays into both nostrils daily as needed for allergies or rhinitis.    [provider]  Ketotifen Fumarate (ALAWAY OP) Place 1 drop into both eyes daily as needed (allergies).     [provider]  nitroGLYCERIN (NITROSTAT) 0.4 MG SL tablet Place 1 tablet under the tongue every 5 minutes as needed for chest pain, max 3 doses, go to er if no relief 01/29/22   Belva Crome, MD  ondansetron (ZOFRAN) 8 MG tablet Take 8 mg by mouth every 8 (eight) hours as needed for nausea or vomiting.  Patient not taking: Reported on 04/06/2022 03/30/17   [provider]  sodium chloride (MURO 128) 5 % ophthalmic ointment Place 1 drop into both eyes at bedtime. For dry eyes    [provider]  TRUEplus Lancets 33G MISC  02/27/19   [provider]      Allergies    Calcium channel blockers, Codeine, Lyrica [pregabalin], Other, Ramipril, Ticlid [ticlopidine hcl], Morphine and related, Tape, Digoxin, Digoxin and related, Diltiazem hcl, Diltiazem hcl er beads, Metformin hcl, Metolazone, Milk-related compounds, Morphine sulfate, Myrbetriq [mirabegron], Nsaids, Onglyza [saxagliptin], Penicillin v, Penicillins, Spironolactone, Sulfa antibiotics, Tetracycline hcl, Tetracyclines & related, Ticlopidine, Verapamil, Vicodin [hydrocodone-acetaminophen], and Latex    Review of Systems   Review of Systems  Respiratory:  Positive for shortness of breath.   Cardiovascular:  Positive for chest pain.  Gastrointestinal:  Positive for nausea.   Physical Exam Updated Vital  Signs BP 107/71   Pulse 78   Temp 98.2 F (36.8 C)   Resp 18   Ht '5\' 4"'$  (1.626 m)   Wt 56.7 kg   SpO2 98%   BMI 21.46 kg/m  Physical Exam Vitals and nursing note reviewed.  Constitutional:      Appearance: She is well-developed.     Comments: Patient is tearful and upset about the loss of her brother  HENT:     Head: Normocephalic and atraumatic.  Cardiovascular:     Rate and Rhythm: Regular rhythm. Tachycardia present.     Heart sounds: Normal heart sounds.  Pulmonary:     Effort: Pulmonary effort is normal.     Breath sounds: Normal breath sounds.  Chest:     Chest wall: Tenderness present.    Abdominal:     Palpations: Abdomen is soft.     Tenderness: There is no abdominal tenderness.  Skin:    General: Skin is warm and dry.  Neurological:     Mental Status: She is alert.    ED Results / Procedures / Treatments   Labs (all labs ordered are listed, but only abnormal results are displayed) Labs Reviewed  BASIC METABOLIC PANEL - Abnormal; Notable for the following components:      Result Value   Glucose, Bld 115 (*)    BUN 44 (*)    Creatinine, Ser 2.81 (*)    GFR, Estimated 18 (*)    All other components within normal limits  CBC -  Abnormal; Notable for the following components:   RBC 3.70 (*)    Hemoglobin 11.1 (*)    HCT 32.2 (*)    All other components within normal limits  TROPONIN I (HIGH SENSITIVITY)  TROPONIN I (HIGH SENSITIVITY)    EKG EKG Interpretation  Date/Time:  Tuesday Apr 06 2022 10:51:16 EDT Ventricular Rate:  80 PR Interval:  114 QRS Duration: 156 QT Interval:  434 QTC Calculation: 500 R Axis:   0 Text Interpretation: Atrial-sensed ventricular-paced rhythm no acute ST/T changes similar to Mar 2021 Confirmed by Sherwood Gambler [phone]) on 04/06/2022 11:11:35 AM  Radiology CT Head Wo Contrast  Result Date: 04/06/2022 CLINICAL DATA:  Acute neuro deficit. EXAM: CT HEAD WITHOUT CONTRAST TECHNIQUE: Contiguous axial images were obtained from the base of the skull through the vertex without intravenous contrast. RADIATION DOSE REDUCTION: This exam was performed according to the departmental dose-optimization program which includes automated exposure control, adjustment of the mA and/or kV according to patient size and/or use of iterative reconstruction technique. COMPARISON:  CT head 01/04/2016 FINDINGS: Brain: No evidence of acute infarction, hemorrhage, hydrocephalus, extra-axial collection or mass lesion/mass effect. Small chronic infarct left head of caudate unchanged. Small chronic infarct left cerebellum. Mild chronic ischemic change in the white matter. Vascular: Negative for hyperdense vessel Skull: Negative Sinuses/Orbits: Mucosal edema in the paranasal sinuses. Negative orbit Other: None IMPRESSION: No acute abnormality.  Mild chronic ischemia. Electronically Signed   By: Franchot Gallo M.D.   On: 04/06/2022 14:10   DG Chest Portable 1 View  Result Date: 04/06/2022 CLINICAL DATA:  Chest pain. EXAM: PORTABLE CHEST 1 VIEW COMPARISON:  01/09/2020 FINDINGS: The cardiac silhouette, mediastinal and hilar contours are within normal limits and stable. Stable surgical changes from coronary artery bypass  surgery. The pacer wire/AICD are in good position without complicating features. No acute pulmonary findings. No pleural effusions or pneumothorax. No pulmonary lesions. The bony thorax is intact. IMPRESSION: No acute cardiopulmonary findings. Electronically Signed  By: Marijo Sanes M.D.   On: 04/06/2022 11:59    Procedures Procedures    Medications Ordered in ED Medications  metoCLOPramide (REGLAN) injection 5 mg (5 mg Intravenous Given 04/06/22 1336)  diphenhydrAMINE (BENADRYL) injection 12.5 mg (12.5 mg Intravenous Given 04/06/22 1338)    ED Course/ Medical Decision Making/ A&P Clinical Course as of 04/06/22 1617  Tue Apr 06, 2022  1258 I was informed by the nurse that the patient is complaining of some left-sided facial numbness/twitching.  On further exam, she has some subjective decrease sensation to the ulnar side of her left forearm and hand as well as in her left face but not her forehead.  No facial droop.  She feels her speech is not quite right.  Technically speaking her last known well was 7 AM because she states that the numbness in the left arm started then.  Discussed with Dr. Rory Percy, will try to get MRI and make sure her pacemaker is able to tolerate this.  We will also do a quick CT of the head as she has been complaining a little bit of a headache. Not a code stroke because she's outside the 4.5 hour window [SG]    Clinical Course User Index [SG] Sherwood Gambler, MD                           Medical Decision Making Amount and/or Complexity of Data Reviewed External Data Reviewed: notes. Labs: ordered. Radiology: ordered and independent interpretation performed. ECG/medicine tests: ordered and independent interpretation performed.  Risk Prescription drug management.   Patient's work-up is overall unremarkable.  Chest x-ray image reviewed by myself and no acute edema.  Labs show troponins negative x2 and otherwise labs seem to be near baseline including chronic kidney  disease.  Otherwise, patient developed some vague neuro symptoms as above.  I suspect this is overall related to a complicated migraine, which she seems to have had before.  CT head images viewed by myself and there is no head bleed.  Symptoms resolved with Reglan and Benadryl.  I read discussed this with Dr. Rory Percy, and no further work-up is needed.  Unlikely to be TIA.  Unfortunately I think overall this is probably all from a stress reaction.  Of note, her ICD was interrogated and there were no arrhythmia events or shocks delivered today.  At this point I think she is stable for discharge home to follow-up with her PCP.  She was given return precautions.        Final Clinical Impression(s) / ED Diagnoses Final diagnoses:  Hemiplegic migraine without status migrainosus, not intractable  Nonspecific chest pain    Rx / DC Orders ED Discharge Orders     None         Sherwood Gambler, MD 04/06/22 1620

## 2022-04-06 NOTE — ED Notes (Signed)
MRI stated that [employer] pacemaker not MRI safe.   MD notified.  No new orders at this time.

## 2022-04-06 NOTE — ED Triage Notes (Signed)
Pt here via [facility] from home for L side chest pain that radiates down L arm that started around 0845 today, w/ a nausea and HA. Pt has a pacemaker/defibrillator that has fired between 6-7 times today. Pt just found out about the loss of a family member. 135/81, 73HR, 100% RA, cbg 150. Pt took 325 mg aspirin at 0745, pt refused SL nitro.

## 2022-04-12 DIAGNOSIS — R519 Headache, unspecified: Secondary | ICD-10-CM | POA: Diagnosis not present

## 2022-04-12 DIAGNOSIS — F439 Reaction to severe stress, unspecified: Secondary | ICD-10-CM | POA: Diagnosis not present

## 2022-04-27 ENCOUNTER — Telehealth: Payer: Self-pay | Admitting: Interventional Cardiology

## 2022-04-27 DIAGNOSIS — N184 Chronic kidney disease, stage 4 (severe): Secondary | ICD-10-CM | POA: Diagnosis not present

## 2022-04-27 MED ORDER — POTASSIUM CHLORIDE CRYS ER 10 MEQ PO TBCR: EXTENDED_RELEASE_TABLET | ORAL | 1 refills | Status: DC

## 2022-04-27 NOTE — Telephone Encounter (Signed)
Pt's medication was sent to pt's pharmacy as requested. Confirmation received.  °

## 2022-04-27 NOTE — Telephone Encounter (Signed)
*  STAT* If patient is at the pharmacy, call can be transferred to refill team.   1. Which medications need to be refilled? (please list name of each medication and dose if known)  new prescription for Potassium Chloride  2. Which pharmacy/location (including street and city if local pharmacy) is medication to be sent to?  [facility]  3. Do they need a 30 day or 90 day supply? 90 days and refills

## 2022-05-03 DIAGNOSIS — N2581 Secondary hyperparathyroidism of renal origin: Secondary | ICD-10-CM | POA: Diagnosis not present

## 2022-05-03 DIAGNOSIS — N184 Chronic kidney disease, stage 4 (severe): Secondary | ICD-10-CM | POA: Diagnosis not present

## 2022-05-03 DIAGNOSIS — I129 Hypertensive chronic kidney disease with stage 1 through stage 4 chronic kidney disease, or unspecified chronic kidney disease: Secondary | ICD-10-CM | POA: Diagnosis not present

## 2022-05-03 DIAGNOSIS — D631 Anemia in chronic kidney disease: Secondary | ICD-10-CM | POA: Diagnosis not present

## 2022-05-13 ENCOUNTER — Ambulatory Visit (INDEPENDENT_AMBULATORY_CARE_PROVIDER_SITE_OTHER): Payer: Medicare HMO

## 2022-05-13 DIAGNOSIS — I255 Ischemic cardiomyopathy: Secondary | ICD-10-CM | POA: Diagnosis not present

## 2022-05-15 LAB — CUP PACEART REMOTE DEVICE CHECK
Battery Remaining Longevity: 18 mo
Battery Voltage: 2.89 V
Brady Statistic AP VP Percent: 0.02 %
Brady Statistic AP VS Percent: 0.03 %
Brady Statistic AS VP Percent: 98.63 %
Brady Statistic AS VS Percent: 1.32 %
Brady Statistic RA Percent Paced: 0.05 %
Brady Statistic RV Percent Paced: 0.25 %
Date Time Interrogation Session: 20230707154612
HighPow Impedance: 39 Ohm
HighPow Impedance: 51 Ohm
Implantable Lead Implant Date: 20090610
Implantable Lead Implant Date: 20090610
Implantable Lead Implant Date: 20090610
Implantable Lead Location: 753858
Implantable Lead Location: 753859
Implantable Lead Location: 753860
Implantable Lead Model: 4194
Implantable Lead Model: 5076
Implantable Lead Model: 6947
Implantable Pulse Generator Implant Date: 20160323
Lead Channel Impedance Value: 247 Ohm
Lead Channel Impedance Value: 342 Ohm
Lead Channel Impedance Value: 456 Ohm
Lead Channel Impedance Value: 475 Ohm
Lead Channel Impedance Value: 513 Ohm
Lead Channel Impedance Value: 665 Ohm
Lead Channel Pacing Threshold Amplitude: 0.75 V
Lead Channel Pacing Threshold Amplitude: 0.875 V
Lead Channel Pacing Threshold Amplitude: 1.375 V
Lead Channel Pacing Threshold Pulse Width: 0.4 ms
Lead Channel Pacing Threshold Pulse Width: 0.4 ms
Lead Channel Pacing Threshold Pulse Width: 0.4 ms
Lead Channel Sensing Intrinsic Amplitude: 2 mV
Lead Channel Sensing Intrinsic Amplitude: 2 mV
Lead Channel Sensing Intrinsic Amplitude: 4.375 mV
Lead Channel Sensing Intrinsic Amplitude: 4.375 mV
Lead Channel Setting Pacing Amplitude: 1.5 V
Lead Channel Setting Pacing Amplitude: 2 V
Lead Channel Setting Pacing Amplitude: 2.5 V
Lead Channel Setting Pacing Pulse Width: 0.4 ms
Lead Channel Setting Pacing Pulse Width: 0.4 ms
Lead Channel Setting Sensing Sensitivity: 0.3 mV

## 2022-05-17 DIAGNOSIS — N184 Chronic kidney disease, stage 4 (severe): Secondary | ICD-10-CM | POA: Diagnosis not present

## 2022-06-01 NOTE — Progress Notes (Signed)
Remote ICD transmission.   

## 2022-06-07 DIAGNOSIS — F331 Major depressive disorder, recurrent, moderate: Secondary | ICD-10-CM | POA: Diagnosis not present

## 2022-06-07 DIAGNOSIS — Z636 Dependent relative needing care at home: Secondary | ICD-10-CM | POA: Diagnosis not present

## 2022-06-07 DIAGNOSIS — F411 Generalized anxiety disorder: Secondary | ICD-10-CM | POA: Diagnosis not present

## 2022-06-22 ENCOUNTER — Other Ambulatory Visit: Payer: Self-pay

## 2022-06-22 MED ORDER — SIMVASTATIN 40 MG PO TABS: 40.0000 mg | ORAL_TABLET | Freq: Every day | ORAL | 1 refills | Status: DC

## 2022-07-13 DIAGNOSIS — Z1331 Encounter for screening for depression: Secondary | ICD-10-CM | POA: Diagnosis not present

## 2022-07-13 DIAGNOSIS — E1121 Type 2 diabetes mellitus with diabetic nephropathy: Secondary | ICD-10-CM | POA: Diagnosis not present

## 2022-07-13 DIAGNOSIS — Z23 Encounter for immunization: Secondary | ICD-10-CM | POA: Diagnosis not present

## 2022-07-13 DIAGNOSIS — N184 Chronic kidney disease, stage 4 (severe): Secondary | ICD-10-CM | POA: Diagnosis not present

## 2022-07-13 DIAGNOSIS — Z0000 Encounter for general adult medical examination without abnormal findings: Secondary | ICD-10-CM | POA: Diagnosis not present

## 2022-07-13 DIAGNOSIS — I2510 Atherosclerotic heart disease of native coronary artery without angina pectoris: Secondary | ICD-10-CM | POA: Diagnosis not present

## 2022-07-13 DIAGNOSIS — I5022 Chronic systolic (congestive) heart failure: Secondary | ICD-10-CM | POA: Diagnosis not present

## 2022-07-13 DIAGNOSIS — F3341 Major depressive disorder, recurrent, in partial remission: Secondary | ICD-10-CM | POA: Diagnosis not present

## 2022-07-13 DIAGNOSIS — E7800 Pure hypercholesterolemia, unspecified: Secondary | ICD-10-CM | POA: Diagnosis not present

## 2022-07-23 DIAGNOSIS — N184 Chronic kidney disease, stage 4 (severe): Secondary | ICD-10-CM | POA: Diagnosis not present

## 2022-07-28 DIAGNOSIS — N2581 Secondary hyperparathyroidism of renal origin: Secondary | ICD-10-CM | POA: Diagnosis not present

## 2022-07-28 DIAGNOSIS — I129 Hypertensive chronic kidney disease with stage 1 through stage 4 chronic kidney disease, or unspecified chronic kidney disease: Secondary | ICD-10-CM | POA: Diagnosis not present

## 2022-07-28 DIAGNOSIS — D631 Anemia in chronic kidney disease: Secondary | ICD-10-CM | POA: Diagnosis not present

## 2022-07-28 DIAGNOSIS — N184 Chronic kidney disease, stage 4 (severe): Secondary | ICD-10-CM | POA: Diagnosis not present

## 2022-08-02 DIAGNOSIS — Z8673 Personal history of transient ischemic attack (TIA), and cerebral infarction without residual deficits: Secondary | ICD-10-CM | POA: Diagnosis not present

## 2022-08-02 DIAGNOSIS — N184 Chronic kidney disease, stage 4 (severe): Secondary | ICD-10-CM | POA: Diagnosis not present

## 2022-08-02 DIAGNOSIS — I2510 Atherosclerotic heart disease of native coronary artery without angina pectoris: Secondary | ICD-10-CM | POA: Diagnosis not present

## 2022-08-02 DIAGNOSIS — E1151 Type 2 diabetes mellitus with diabetic peripheral angiopathy without gangrene: Secondary | ICD-10-CM | POA: Diagnosis not present

## 2022-08-02 DIAGNOSIS — Z833 Family history of diabetes mellitus: Secondary | ICD-10-CM | POA: Diagnosis not present

## 2022-08-12 ENCOUNTER — Ambulatory Visit: Payer: Medicare Other

## 2022-09-17 DIAGNOSIS — Z95810 Presence of automatic (implantable) cardiac defibrillator: Secondary | ICD-10-CM | POA: Diagnosis not present

## 2022-09-17 DIAGNOSIS — I25709 Atherosclerosis of coronary artery bypass graft(s), unspecified, with unspecified angina pectoris: Secondary | ICD-10-CM | POA: Diagnosis not present

## 2022-09-17 DIAGNOSIS — I255 Ischemic cardiomyopathy: Secondary | ICD-10-CM | POA: Diagnosis not present

## 2022-09-17 DIAGNOSIS — I471 Supraventricular tachycardia, unspecified: Secondary | ICD-10-CM | POA: Diagnosis not present

## 2022-09-17 DIAGNOSIS — I120 Hypertensive chronic kidney disease with stage 5 chronic kidney disease or end stage renal disease: Secondary | ICD-10-CM | POA: Diagnosis not present

## 2022-09-17 DIAGNOSIS — E1121 Type 2 diabetes mellitus with diabetic nephropathy: Secondary | ICD-10-CM | POA: Diagnosis not present

## 2022-09-17 DIAGNOSIS — N186 End stage renal disease: Secondary | ICD-10-CM | POA: Diagnosis not present

## 2022-09-17 DIAGNOSIS — Z114 Encounter for screening for human immunodeficiency virus [HIV]: Secondary | ICD-10-CM | POA: Diagnosis not present

## 2022-09-17 DIAGNOSIS — Z01818 Encounter for other preprocedural examination: Secondary | ICD-10-CM | POA: Diagnosis not present

## 2022-09-17 DIAGNOSIS — I252 Old myocardial infarction: Secondary | ICD-10-CM | POA: Diagnosis not present

## 2022-09-17 DIAGNOSIS — Z7682 Awaiting organ transplant status: Secondary | ICD-10-CM | POA: Diagnosis not present

## 2022-09-17 DIAGNOSIS — N184 Chronic kidney disease, stage 4 (severe): Secondary | ICD-10-CM | POA: Diagnosis not present

## 2022-09-17 DIAGNOSIS — G459 Transient cerebral ischemic attack, unspecified: Secondary | ICD-10-CM | POA: Diagnosis not present

## 2022-09-17 DIAGNOSIS — Z1159 Encounter for screening for other viral diseases: Secondary | ICD-10-CM | POA: Diagnosis not present

## 2022-09-17 DIAGNOSIS — E1122 Type 2 diabetes mellitus with diabetic chronic kidney disease: Secondary | ICD-10-CM | POA: Diagnosis not present

## 2022-09-20 ENCOUNTER — Other Ambulatory Visit: Payer: Self-pay | Admitting: Interventional Cardiology

## 2022-09-21 ENCOUNTER — Telehealth: Payer: Self-pay | Admitting: Interventional Cardiology

## 2022-09-21 DIAGNOSIS — I2510 Atherosclerotic heart disease of native coronary artery without angina pectoris: Secondary | ICD-10-CM

## 2022-09-21 NOTE — Telephone Encounter (Signed)
New Message:     Patient says she is sup;posed to be a kidney transplant. She said he doctor says she needs an Echo and an Exercise Stress Test. She would like to have Dr Tamala Julian to order these for her please. She would like to have them in this office please.o

## 2022-09-21 NOTE — Telephone Encounter (Signed)
Returned call to patient.  Patient states she was referred to [facility] from [facility] to get on their kidney transplant list for stage 4 kidney disease. Patient states she has lost her father and more recently her brother to kidney disease.  Patient states she has met with transplant team at [facility] and was told she would need an echocardiogram and exercise stress test performed to see if she qualified to be placed on the transplant list.  Patient states the team at [facility] specifically stated she needs an exercise stress test vs nuclear scan with medication.  The kidney transplant medical director at [facility] is Dr. Brenton Grills. Any test results needing to be sent to the transplant team are to be faxed to [phone], Attn: Beatrice Lecher.  Will forward to Dr. Tamala Julian to review and advise.  Patient has appointment with Dr. Tamala Julian on 10/21/22.

## 2022-09-22 DIAGNOSIS — I110 Hypertensive heart disease with heart failure: Secondary | ICD-10-CM | POA: Diagnosis not present

## 2022-09-22 DIAGNOSIS — H6693 Otitis media, unspecified, bilateral: Secondary | ICD-10-CM | POA: Diagnosis not present

## 2022-09-22 DIAGNOSIS — J0100 Acute maxillary sinusitis, unspecified: Secondary | ICD-10-CM | POA: Diagnosis not present

## 2022-09-22 DIAGNOSIS — I5032 Chronic diastolic (congestive) heart failure: Secondary | ICD-10-CM | POA: Diagnosis not present

## 2022-09-22 DIAGNOSIS — R053 Chronic cough: Secondary | ICD-10-CM | POA: Diagnosis not present

## 2022-09-27 ENCOUNTER — Telehealth (HOSPITAL_COMMUNITY): Payer: Self-pay | Admitting: *Deleted

## 2022-09-27 ENCOUNTER — Encounter: Payer: Self-pay | Admitting: Interventional Cardiology

## 2022-09-27 NOTE — Telephone Encounter (Signed)
Patient given detailed instructions per Myocardial Perfusion Study Information Sheet for the test on 09/29/22 Patient notified to arrive 15 minutes early and that it is imperative to arrive on time for appointment to keep from having the test rescheduled.  If you need to cancel or reschedule your appointment, please call the office within 24 hours of your appointment. . Patient verbalized understanding. [name]

## 2022-09-27 NOTE — Telephone Encounter (Signed)
Spoke with patient and discussed testing ordered by Dr. Tamala Julian.  Per Dr. Tamala Julian:  Faythe Ghee to order 2 D Doppler echo "CAD" If she can walk on a treadmill, please order a stress Myoview. Otherwise, perform a [employer].    Patient instructions sent via MyChart message.  Scheduler will contact patient to make appts for echo and stress myoview.  Patient verbalized understanding of the above and expressed appreciation for follow-up.

## 2022-09-29 ENCOUNTER — Ambulatory Visit (HOSPITAL_COMMUNITY): Payer: Medicare HMO | Attending: Cardiology

## 2022-09-29 ENCOUNTER — Ambulatory Visit (HOSPITAL_BASED_OUTPATIENT_CLINIC_OR_DEPARTMENT_OTHER): Payer: Medicare HMO

## 2022-09-29 DIAGNOSIS — I2510 Atherosclerotic heart disease of native coronary artery without angina pectoris: Secondary | ICD-10-CM | POA: Insufficient documentation

## 2022-09-29 LAB — MYOCARDIAL PERFUSION IMAGING
LV dias vol: 78 mL (ref 46–106)
LV sys vol: 38 mL
Nuc Stress EF: 52 %
Peak HR: 113 {beats}/min
Rest HR: 74 {beats}/min
Rest Nuclear Isotope Dose: 10.3 mCi
SDS: 1
SRS: 0
SSS: 1
ST Depression (mm): 0 mm
Stress Nuclear Isotope Dose: 31 mCi
TID: 0.98

## 2022-09-29 LAB — ECHOCARDIOGRAM COMPLETE
Area-P 1/2: 3.65 cm2
Calc EF: 48.4 %
Height: 64 in
S' Lateral: 3.43 cm
Single Plane A2C EF: 51.3 %
Single Plane A4C EF: 45 %
Weight: 2000 oz

## 2022-09-29 MED ORDER — REGADENOSON 0.4 MG/5ML IV SOLN
0.4000 mg | Freq: Once | INTRAVENOUS | Status: AC
  Administered 2022-09-29: 0.4 mg via INTRAVENOUS

## 2022-09-29 MED ORDER — TECHNETIUM TC 99M TETROFOSMIN IV KIT
31.0000 | PACK | Freq: Once | INTRAVENOUS | Status: AC | PRN
  Administered 2022-09-29: 31 via INTRAVENOUS

## 2022-09-29 MED ORDER — TECHNETIUM TC 99M TETROFOSMIN IV KIT
10.3000 | PACK | Freq: Once | INTRAVENOUS | Status: AC | PRN
  Administered 2022-09-29: 10.3 via INTRAVENOUS

## 2022-10-12 DIAGNOSIS — N184 Chronic kidney disease, stage 4 (severe): Secondary | ICD-10-CM | POA: Diagnosis not present

## 2022-10-18 ENCOUNTER — Other Ambulatory Visit: Payer: Self-pay | Admitting: Interventional Cardiology

## 2022-10-19 DIAGNOSIS — N184 Chronic kidney disease, stage 4 (severe): Secondary | ICD-10-CM | POA: Diagnosis not present

## 2022-10-19 DIAGNOSIS — I129 Hypertensive chronic kidney disease with stage 1 through stage 4 chronic kidney disease, or unspecified chronic kidney disease: Secondary | ICD-10-CM | POA: Diagnosis not present

## 2022-10-19 DIAGNOSIS — N2581 Secondary hyperparathyroidism of renal origin: Secondary | ICD-10-CM | POA: Diagnosis not present

## 2022-10-19 DIAGNOSIS — D631 Anemia in chronic kidney disease: Secondary | ICD-10-CM | POA: Diagnosis not present

## 2022-10-20 DIAGNOSIS — F411 Generalized anxiety disorder: Secondary | ICD-10-CM | POA: Diagnosis not present

## 2022-10-20 DIAGNOSIS — F331 Major depressive disorder, recurrent, moderate: Secondary | ICD-10-CM | POA: Diagnosis not present

## 2022-10-20 NOTE — Progress Notes (Unsigned)
Cardiology Office Note:    Date:  10/21/2022   ID:  [name], DOB 10-Jun-1951, MRN [id number]  PCP:  [name], MD  Cardiologist:  Sinclair Grooms, MD   Referring MD: [name], MD   Chief Complaint  Patient presents with   Coronary Artery Disease   Congestive Heart Failure   Cardiac Valve Problem    Regurgitation   Hyperlipidemia   Follow-up    CKD 4    History of Present Illness:    [name] is a 71 y.o. female with a hx of CAD, CKD stage IV, CHF, BiV-IC defibrillator, ischemic cardiomyopathy, mitral valve disease with moderate regurgitation, diabetes mellitus II, ischemic stroke, and recent episode of angioedema.    Recent request from patient 09/21/2022: "Patient states she was referred to [facility] from [facility] to get on their kidney transplant list for stage 4 kidney disease. Patient states she has lost her father and more recently her brother to kidney disease.   Patient states she has met with transplant team at [facility] and was told she would need an echocardiogram and exercise stress test performed to see if she qualified to be placed on the transplant list."   She had a stress myocardial perfusion study but could not get heart rate high enough to get an accurate study for ischemia.  She walked 3 minutes and 30 seconds on the treadmill before the study was converted to a [place] study.  She cannot walk further because of bilateral calf claudication.  No chest pain or dyspnea with walking on the standard Bruce protocol.  She is interested in transplantation.  She has a dramatic family history on her father side of end-stage kidney disease with a brother, father, and 5 paternal first cousins who were on dialysis and of subsequently died.  She does not want to be on dialysis.  She has seen Dr. Quay Burow in the past and would like to follow-up with him as her cardiologist going forward.  Past Medical History:  Diagnosis Date   AICD (automatic  cardioverter/defibrillator) present    [employer]- Dr. Beckie Salts follows   Anginal pain ([place])    Anxiety    Asthma    Back pain    "pinched nerve-lower back" - Dr. Nelva Bush follows.   Biventricular implantable cardioverter-defibrillator in situ 06/18/2011   Cerebral infarction ([place]) 11/11/2012   Cervical dysplasia    Chronic diastolic heart failure ([place]) 03/22/2016   Chronic kidney disease    Dr. Posey Pronto follows.   Chronic renal insufficiency, stage III (moderate) (HCC) 12/12/5359   Complication of anesthesia    "I wake up during surgeries" (02/14/2013)   Coronary artery disease involving coronary bypass graft of native heart with angina pectoris ([place]) 06/18/2011   Depression    DM (diabetes mellitus) ([place]) 11/11/2012   Fibroid    Function kidney decreased    GERD (gastroesophageal reflux disease)    Hemiplegia, unspecified, affecting nondominant side 11/11/2012   Hepatitis    Hepatitis A -college yrs"water source exposure"   Hiatal hernia    History of shingles    2-3 yrs ago last out break "around waist"   History of stomach ulcers    Hyperlipidemia 07/30/2016   Hypertension    ICD (implantable cardiac defibrillator) in place    Iron deficiency anemia    Ischemic cardiomyopathy    status post biventricular ICD placed by DR Edumunds who used to see Dr Melvern Banker here to establish  cardiovascular care.  Migraines    MVP (mitral valve prolapse)    Antibiotics not required for procedures   Myocardial infarction ([place])    "I've had 2; the others they were able to catch before completing" (02/14/2013)   Pacemaker    Paroxysmal SVT (supraventricular tachycardia) 06/18/2011   Pneumonia 1950's & 1985   Shortness of breath    "lying down flat; at times w/exertion" (02/14/2013). 10-06-15 exertion only..   Stroke [facility])    "2 confirmed; 9 TIA's; results in dragging LLE; numbness in tip of tongue" (02/14/2013),10-06-15 right hand tends to be weaker when tired.   TIA (transient ischemic attack) 11/11/2012   Type  II diabetes mellitus [facility])     Past Surgical History:  Procedure Laterality Date   ABDOMINAL HYSTERECTOMY  1985   TAH    BIV ICD GENERTAOR CHANGE OUT  06/2007; 04/2008   "2 lead initial placement, at [facility]; done at [facility], after developing CHF" (02/14/2013)   BIV PACEMAKER GENERATOR CHANGE OUT N/A 01/29/2015   Procedure: BIV PACEMAKER GENERATOR CHANGE OUT;  Surgeon: Evans Lance, MD;  Location: [facility] CATH LAB;  Service: Cardiovascular;  Laterality: N/A;   BREAST EXCISIONAL BIOPSY Left 01/2007; 06/2007; 03/2008   "benign" (02/14/2013)   BREAST SURGERY     CARDIAC CATHETERIZATION     "probably in the teens" (02/14/2013)   CARDIAC DEFIBRILLATOR PLACEMENT     COLONOSCOPY  ~ 2002   COLONOSCOPY WITH PROPOFOL N/A 10/07/2015   Procedure: COLONOSCOPY WITH PROPOFOL;  Surgeon: Juanita Craver, MD;  Location: [facility] ENDOSCOPY;  Service: Endoscopy;  Laterality: N/A;   CORONARY ANGIOPLASTY WITH STENT PLACEMENT     "started out w/5; bypass corrected some; 1 stent since the bypass" (02/14/2013)   CORONARY ARTERY BYPASS GRAFT  ` 1998   LIMA-LAD, SVG-D1, SVG-PDA   DILATION AND CURETTAGE OF UTERUS  1975 X 2; 1976; 1977   INSERT / REPLACE / REMOVE PACEMAKER     biventricular defibrillator--06/10/ 2009   LEFT HEART CATH AND CORS/GRAFTS ANGIOGRAPHY N/A 08/04/2018   Procedure: LEFT HEART CATH AND CORS/GRAFTS ANGIOGRAPHY;  Surgeon: Burnell Blanks, MD;  Location: [place] CV LAB;  Service: Cardiovascular;  Laterality: N/A;   LEFT HEART CATHETERIZATION WITH CORONARY/GRAFT ANGIOGRAM N/A 01/03/2015   Procedure: LEFT HEART CATHETERIZATION WITH Beatrix Fetters;  Surgeon: Sinclair Grooms, MD;  Location[facility] CATH LAB;  Service: Cardiovascular;  Laterality: N/A;   SUPRAVENTRICULAR TACHYCARDIA ABLATION  06/2007   TEE WITHOUT CARDIOVERSION  11/14/2012   Procedure: TRANSESOPHAGEAL ECHOCARDIOGRAM (TEE);  Surgeon: Candee Furbish, MD;  Location: [facility] ENDOSCOPY;  Service: Cardiovascular;  Laterality: N/A;    Current  Medications: Current Meds  Medication Sig   albuterol (PROVENTIL HFA;VENTOLIN HFA) 108 (90 BASE) MCG/ACT inhaler Inhale 2 puffs into the lungs every 4 (four) hours as needed for wheezing or shortness of breath.   Alcohol Swabs (B-D SINGLE USE SWABS REGULAR) PADS    ALPRAZolam (XANAX) 1 MG tablet Take 1 mg by mouth 3 (three) times daily.    Artificial Tear GEL Place 2 drops into both eyes daily as needed (dry eyes).    aspirin 325 MG EC tablet Take 325 mg by mouth daily.   benzonatate (TESSALON) 100 MG capsule Take 1 capsule (100 mg total) by mouth 3 (three) times daily as needed for cough.   BIOTIN PO Take 1 tablet by mouth daily.   cetirizine (ZYRTEC) 10 MG tablet Take 10 mg by mouth daily.   Cholecalciferol (VITAMIN D-3) 1000 UNITS CAPS Take 1,000 Units by mouth  daily.    clopidogrel (PLAVIX) 75 MG tablet Take 1 tablet (75 mg total) by mouth daily.   cyclobenzaprine (FLEXERIL) 5 MG tablet Take 1 tablet (5 mg total) by mouth at bedtime as needed for muscle spasms.   doxazosin (CARDURA) 4 MG tablet Take 4 mg by mouth at bedtime.    DROPLET PEN NEEDLES 32G X 4 MM MISC    EPINEPHrine 0.3 mg/0.3 mL IJ SOAJ injection Inject 0.3 mg into the muscle as needed for anaphylaxis.    ezetimibe (ZETIA) 10 MG tablet Take 1 tablet (10 mg total) by mouth daily.   Flaxseed, Linseed, (FLAXSEED OIL) 1200 MG CAPS Take 1,200 mg by mouth daily.   fluticasone (FLONASE) 50 MCG/ACT nasal spray Place 2 sprays into both nostrils daily as needed for allergies or rhinitis.   furosemide (LASIX) 80 MG tablet TAKE 1 TABLET TWICE A DAY. MAY TAKE AN ADDITIONAL 80MG (1 TABLET) AS NEEDED FOR EDEMA   hydrALAZINE (APRESOLINE) 25 MG tablet Take 1 tablet (25 mg total) by mouth 2 (two) times daily.   isosorbide mononitrate (IMDUR) 60 MG 24 hr tablet Take 1 tablet (60 mg total) by mouth daily.   Ketotifen Fumarate (ALAWAY OP) Place 1 drop into both eyes daily as needed (allergies).    LANTUS SOLOSTAR 100 UNIT/ML Solostar Pen Inject 6  Units into the skin at bedtime.    metoprolol succinate (TOPROL-XL) 100 MG 24 hr tablet Take 2 tablets (200 mg total) by mouth daily. Take with or immediately following a meal.   Multiple Vitamin (MULTIVITAMIN WITH MINERALS) TABS Take 1 tablet by mouth daily.   nitroGLYCERIN (NITROSTAT) 0.4 MG SL tablet Place 1 tablet under the tongue every 5 minutes as needed for chest pain, max 3 doses, go to er if no relief   olmesartan (BENICAR) 40 MG tablet Take 1 tablet (40 mg total) by mouth daily.   ondansetron (ZOFRAN) 8 MG tablet Take 8 mg by mouth every 8 (eight) hours as needed for nausea or vomiting.   OZEMPIC, 0.25 OR 0.5 MG/DOSE, 2 MG/1.5ML SOPN Inject 0.25 mg into the skin once a week. Sunday   pantoprazole (PROTONIX) 40 MG tablet TAKE 1 TABLET BY MOUTH  DAILY   potassium chloride (KLOR-CON M) 10 MEQ tablet TAKE 2 TABLETS TWICE A DAY (PLEASE ATTEND SCHEDULE APPOINTMENT FOR ADDITIONAL REFILLS)   simvastatin (ZOCOR) 40 MG tablet Take 1 tablet (40 mg total) by mouth at bedtime.   sodium chloride (MURO 128) 5 % ophthalmic ointment Place 1 drop into both eyes at bedtime. For dry eyes   traMADol (ULTRAM) 50 MG tablet Take 100 mg by mouth every 6 (six) hours as needed (MIGRAINES).   TRUE METRIX BLOOD GLUCOSE TEST test strip    TRUEplus Lancets 33G MISC    venlafaxine (EFFEXOR) 100 MG tablet Take 100-200 mg by mouth See admin instructions. TAKE 200 mg by mouth in the morning and take 100 mg at noon   vitamin A 7500 UNIT capsule Take 2,400 Units by mouth daily.     Allergies:   Calcium channel blockers, Codeine, Lyrica [pregabalin], Other, Ramipril, Ticlid [ticlopidine hcl], Morphine and related, Tape, Digoxin, Digoxin and related, Diltiazem hcl, Diltiazem hcl er beads, Metformin hcl, Metolazone, Milk-related compounds, Morphine sulfate, Myrbetriq [mirabegron], Nsaids, Onglyza [saxagliptin], Penicillin v, Penicillins, Spironolactone, Sulfa antibiotics, Tetracycline hcl, Tetracyclines & related, Ticlopidine,  Verapamil, Vicodin [hydrocodone-acetaminophen], and Latex   Social History   Socioeconomic History   Marital status: Married    Spouse name: [name]   Number  of children: 1   Years of education: MA   Highest education level: Not on file  Occupational History    Employer: UNEMPLOYED    Comment: Disablity  Tobacco Use   Smoking status: Never   Smokeless tobacco: Never  Vaping Use   Vaping Use: Never used  Substance and Sexual Activity   Alcohol use: No    Alcohol/week: 0.0 standard drinks of alcohol   Drug use: No   Sexual activity: Yes    Birth control/protection: Surgical  Other Topics Concern   Not on file  Social History Narrative   Patient lives at home with spouse.     Daughter name is Tourist information centre manager.   Caffeine Use: none   Social Determinants of Radio broadcast assistant Strain: Not on file  Food Insecurity: Not on file  Transportation Needs: Not on file  Physical Activity: Not on file  Stress: Not on file  Social Connections: Not on file     Family History: The patient's family history includes Diabetes in her brother, father, and paternal grandmother; Heart attack in her brother, father, and mother; Heart disease in her brother, father, and mother; Hypertension in her father and mother; Kidney failure in her brother and father; Stroke in her father and mother.  ROS:   Please see the history of present illness.    Claudication.  No significant palpitations.  Stable angina with no recent nitroglycerin use.  All other systems reviewed and are negative.  EKGs/Labs/Other Studies Reviewed:    The following studies were reviewed today:  2D Doppler echocardiogram 09/29/2022: IMPRESSIONS   1. Left ventricular ejection fraction, by estimation, is 45 to 50%. Left  ventricular ejection fraction by 2D MOD biplane is 48.4 %. The left  ventricle has mildly decreased function. The left ventricle has no  regional wall motion abnormalities. Left  ventricular diastolic  parameters are consistent with Grade I diastolic  dysfunction (impaired relaxation).   2. Right ventricular systolic function is mildly reduced. The right  ventricular size is normal. There is normal pulmonary artery systolic  pressure. The estimated right ventricular systolic pressure is 63.8 mmHg.   3. The mitral valve is abnormal. Mild mitral valve regurgitation.   4. The aortic valve is tricuspid. Aortic valve regurgitation is not  visualized.   5. The inferior vena cava is normal in size with greater than 50%  respiratory variability, suggesting right atrial pressure of 3 mmHg.   Comparison(s): 01/24/20 EF 50-55%. GLS -15.9%.   Myocardial perfusion imaging with Lexiscan stress: Study Highlights  Addendum by Freada Bergeron, MD on Wed Sep 29, 2022  2:03 PM     The study shows normal myocardial perfusion. The study is low risk.   No ST deviation was noted.   LV perfusion is normal.   Left ventricular function is abnormal. Global function is mildly reduced. Nuclear stress EF: 52 %. The left ventricular ejection fraction is mildly decreased (45-54%). End diastolic cavity size is normal. End systolic cavity size is normal.  EKG:  EKG a new tracing is not performed today.  EKG from Apr 07, 2022 demonstrates atrial tracking with ventricular pacing.  Recent Labs: 04/06/2022: BUN 44; Creatinine, Ser 2.81; Hemoglobin 11.1; Platelets 204; Potassium 4.1; Sodium 138  Recent Lipid Panel    Component Value Date/Time   CHOL 179 04/06/2019 1510   TRIG 129 04/06/2019 1510   HDL 61 04/06/2019 1510   CHOLHDL 2.9 04/06/2019 1510   CHOLHDL 3.2 01/02/2016 0730   VLDL 26  01/02/2016 0730   LDLCALC 92 04/06/2019 1510    Physical Exam:    VS:  BP 98/68   Pulse 84   Ht _0  (1.626 m)   Wt 120 lb 9.6 oz (54.7 kg)   SpO2 98%   BMI 20.70 kg/m     Wt Readings from Last 3 Encounters:  10/21/22 120 lb 9.6 oz (54.7 kg)  09/29/22 125 lb (56.7 kg)  04/06/22 125 lb (56.7 kg)     GEN: She is  further lost weight.  Has been under stress.. No acute distress HEENT: Normal NECK: No JVD. LYMPHATICS: No lymphadenopathy CARDIAC: Apical systolic murmur. RRR no gallop, or edema. VASCULAR:  Normal Pulses. No bruits. RESPIRATORY:  Clear to auscultation without rales, wheezing or rhonchi  ABDOMEN: Soft, non-tender, non-distended, No pulsatile mass, MUSCULOSKELETAL: No deformity  SKIN: Warm and dry NEUROLOGIC:  Alert and oriented x 3 PSYCHIATRIC:  Normal affect   ASSESSMENT:    1. Coronary artery disease involving native coronary artery of native heart without angina pectoris   2. Chronic systolic heart failure ([place])   3. Biventricular implantable cardioverter-defibrillator in situ   4. Essential hypertension   5. Type 2 diabetes mellitus without complication, with long-term current use of insulin (HCC)   6. Stage 3b chronic kidney disease ([place])    PLAN:    In order of problems listed above:  Continue preventive measures with Zetia, Zocor, aspirin and Plavix.. Continue Benicar, Toprol-XL, a Apresoline, furosemide, Imdur. Continue to follow in device clinic Blood pressure is well-controlled on her current medications.  As her weight continues to drop we may need to decrease the dose of furosemide.  Would not do this without connecting with nephrology. GFR is too low to use SGLT2 therapy.  She is on Ozempic and likely accounts for the weight loss. Being evaluated for kidney transplantation at [facility].  She was unable to walk beyond 3 minutes 30 seconds on standard Bruce protocol before lower extremity claudication caused her to stop.  She requests Dr. Quay Burow as her general cardiologist going forward.  He has done coronary angiography on her and through her prior cardiologist, Dr. Myrtice Lauth.  Follow-up will be scheduled for 6 to 8 months.  Medication Adjustments/Labs and Tests Ordered: Current medicines are reviewed at length with the patient today.   Concerns regarding medicines are outlined above.  No orders of the defined types were placed in this encounter.  No orders of the defined types were placed in this encounter.   Patient Instructions  Medication Instructions:  Your physician recommends that you continue on your current medications as directed. Please refer to the Current Medication list given to you today.  *If you need a refill on your cardiac medications before your next appointment, please call your pharmacy*  Follow-Up: At [facility], you and your health needs are our priority.  As part of our continuing mission to provide you with exceptional heart care, we have created designated Provider Care Teams.  These Care Teams include your primary Cardiologist (physician) and Advanced Practice Providers (APPs -  Physician Assistants and Nurse Practitioners) who all work together to provide you with the care you need, when you need it.  Your next appointment:   6-8 month(s)  The format for your next appointment:   In Person  Provider:   Quay Burow, MD   Important Information About Sugar         Signed, Sinclair Grooms, MD  10/21/2022 5:53 PM    [facility]

## 2022-10-21 ENCOUNTER — Encounter: Payer: Self-pay | Admitting: Interventional Cardiology

## 2022-10-21 ENCOUNTER — Ambulatory Visit: Payer: Medicare HMO | Attending: Interventional Cardiology | Admitting: Interventional Cardiology

## 2022-10-21 VITALS — BP 98/68 | HR 84 | Ht 64.0 in | Wt 120.6 lb

## 2022-10-21 DIAGNOSIS — I2510 Atherosclerotic heart disease of native coronary artery without angina pectoris: Secondary | ICD-10-CM | POA: Diagnosis not present

## 2022-10-21 DIAGNOSIS — N1832 Chronic kidney disease, stage 3b: Secondary | ICD-10-CM | POA: Diagnosis not present

## 2022-10-21 DIAGNOSIS — Z794 Long term (current) use of insulin: Secondary | ICD-10-CM

## 2022-10-21 DIAGNOSIS — Z95810 Presence of automatic (implantable) cardiac defibrillator: Secondary | ICD-10-CM | POA: Diagnosis not present

## 2022-10-21 DIAGNOSIS — E119 Type 2 diabetes mellitus without complications: Secondary | ICD-10-CM | POA: Diagnosis not present

## 2022-10-21 DIAGNOSIS — I10 Essential (primary) hypertension: Secondary | ICD-10-CM

## 2022-10-21 DIAGNOSIS — I5022 Chronic systolic (congestive) heart failure: Secondary | ICD-10-CM | POA: Diagnosis not present

## 2022-10-21 NOTE — Patient Instructions (Signed)
Medication Instructions:  Your physician recommends that you continue on your current medications as directed. Please refer to the Current Medication list given to you today.  *If you need a refill on your cardiac medications before your next appointment, please call your pharmacy*  Follow-Up: At [facility], you and your health needs are our priority.  As part of our continuing mission to provide you with exceptional heart care, we have created designated Provider Care Teams.  These Care Teams include your primary Cardiologist (physician) and Advanced Practice Providers (APPs -  Physician Assistants and Nurse Practitioners) who all work together to provide you with the care you need, when you need it.  Your next appointment:   6-8 month(s)  The format for your next appointment:   In Person  Provider:   Quay Burow, MD   Important Information About Sugar

## 2022-10-28 DIAGNOSIS — H25813 Combined forms of age-related cataract, bilateral: Secondary | ICD-10-CM | POA: Diagnosis not present

## 2022-10-28 DIAGNOSIS — H04123 Dry eye syndrome of bilateral lacrimal glands: Secondary | ICD-10-CM | POA: Diagnosis not present

## 2022-10-28 DIAGNOSIS — E119 Type 2 diabetes mellitus without complications: Secondary | ICD-10-CM | POA: Diagnosis not present

## 2022-10-28 DIAGNOSIS — H35033 Hypertensive retinopathy, bilateral: Secondary | ICD-10-CM | POA: Diagnosis not present

## 2022-11-12 ENCOUNTER — Telehealth: Payer: Self-pay

## 2022-11-12 ENCOUNTER — Other Ambulatory Visit: Payer: Self-pay | Admitting: Family Medicine

## 2022-11-12 DIAGNOSIS — N184 Chronic kidney disease, stage 4 (severe): Secondary | ICD-10-CM

## 2022-11-12 DIAGNOSIS — Z7682 Awaiting organ transplant status: Secondary | ICD-10-CM

## 2022-11-12 DIAGNOSIS — Z1231 Encounter for screening mammogram for malignant neoplasm of breast: Secondary | ICD-10-CM | POA: Diagnosis not present

## 2022-11-12 NOTE — Telephone Encounter (Signed)
I spoke with the patient and [employer] is sending her a new monitor.

## 2022-11-16 ENCOUNTER — Other Ambulatory Visit: Payer: Self-pay | Admitting: Interventional Cardiology

## 2022-11-23 ENCOUNTER — Ambulatory Visit: Payer: Medicare HMO | Attending: Internal Medicine

## 2022-11-23 DIAGNOSIS — I255 Ischemic cardiomyopathy: Secondary | ICD-10-CM

## 2022-11-25 LAB — CUP PACEART REMOTE DEVICE CHECK
Battery Remaining Longevity: 12 mo
Battery Voltage: 2.88 V
Brady Statistic AP VP Percent: 0.02 %
Brady Statistic AP VS Percent: 0.04 %
Brady Statistic AS VP Percent: 98.61 %
Brady Statistic AS VS Percent: 1.34 %
Brady Statistic RA Percent Paced: 0.06 %
Brady Statistic RV Percent Paced: 0.14 %
Date Time Interrogation Session: 20240116182240
HighPow Impedance: 34 Ohm
HighPow Impedance: 48 Ohm
Implantable Lead Connection Status: 753985
Implantable Lead Connection Status: 753985
Implantable Lead Connection Status: 753985
Implantable Lead Implant Date: 20090610
Implantable Lead Implant Date: 20090610
Implantable Lead Implant Date: 20090610
Implantable Lead Location: 753858
Implantable Lead Location: 753859
Implantable Lead Location: 753860
Implantable Lead Model: 4194
Implantable Lead Model: 5076
Implantable Lead Model: 6947
Implantable Pulse Generator Implant Date: 20160323
Lead Channel Impedance Value: 247 Ohm
Lead Channel Impedance Value: 342 Ohm
Lead Channel Impedance Value: 418 Ohm
Lead Channel Impedance Value: 456 Ohm
Lead Channel Impedance Value: 532 Ohm
Lead Channel Impedance Value: 608 Ohm
Lead Channel Pacing Threshold Amplitude: 0.75 V
Lead Channel Pacing Threshold Amplitude: 0.75 V
Lead Channel Pacing Threshold Amplitude: 1.5 V
Lead Channel Pacing Threshold Pulse Width: 0.4 ms
Lead Channel Pacing Threshold Pulse Width: 0.4 ms
Lead Channel Pacing Threshold Pulse Width: 0.4 ms
Lead Channel Sensing Intrinsic Amplitude: 2.125 mV
Lead Channel Sensing Intrinsic Amplitude: 2.125 mV
Lead Channel Sensing Intrinsic Amplitude: 4.625 mV
Lead Channel Sensing Intrinsic Amplitude: 4.625 mV
Lead Channel Setting Pacing Amplitude: 1.5 V
Lead Channel Setting Pacing Amplitude: 2 V
Lead Channel Setting Pacing Amplitude: 2.5 V
Lead Channel Setting Pacing Pulse Width: 0.4 ms
Lead Channel Setting Pacing Pulse Width: 0.4 ms
Lead Channel Setting Sensing Sensitivity: 0.3 mV
Zone Setting Status: 755011
Zone Setting Status: 755011

## 2022-11-26 DIAGNOSIS — Z01419 Encounter for gynecological examination (general) (routine) without abnormal findings: Secondary | ICD-10-CM | POA: Diagnosis not present

## 2022-11-26 DIAGNOSIS — Z90710 Acquired absence of both cervix and uterus: Secondary | ICD-10-CM | POA: Diagnosis not present

## 2022-11-26 DIAGNOSIS — N952 Postmenopausal atrophic vaginitis: Secondary | ICD-10-CM | POA: Diagnosis not present

## 2022-11-29 ENCOUNTER — Other Ambulatory Visit: Payer: Self-pay | Admitting: Interventional Cardiology

## 2022-12-09 ENCOUNTER — Ambulatory Visit
Admission: RE | Admit: 2022-12-09 | Discharge: 2022-12-09 | Disposition: A | Discharge status: Home / Self Care | Payer: Medicare HMO | Source: Ambulatory Visit | Attending: Family Medicine | Admitting: Family Medicine

## 2022-12-09 DIAGNOSIS — N184 Chronic kidney disease, stage 4 (severe): Secondary | ICD-10-CM

## 2022-12-09 DIAGNOSIS — Z940 Kidney transplant status: Secondary | ICD-10-CM | POA: Diagnosis not present

## 2022-12-09 DIAGNOSIS — I700 Atherosclerosis of aorta: Secondary | ICD-10-CM | POA: Diagnosis not present

## 2022-12-09 DIAGNOSIS — Z7682 Awaiting organ transplant status: Secondary | ICD-10-CM

## 2022-12-09 DIAGNOSIS — Z90710 Acquired absence of both cervix and uterus: Secondary | ICD-10-CM | POA: Diagnosis not present

## 2022-12-10 DIAGNOSIS — H25811 Combined forms of age-related cataract, right eye: Secondary | ICD-10-CM | POA: Diagnosis not present

## 2022-12-10 DIAGNOSIS — H52221 Regular astigmatism, right eye: Secondary | ICD-10-CM | POA: Diagnosis not present

## 2022-12-17 NOTE — Progress Notes (Signed)
Remote ICD transmission.   

## 2022-12-22 DIAGNOSIS — H2512 Age-related nuclear cataract, left eye: Secondary | ICD-10-CM | POA: Diagnosis not present

## 2022-12-24 DIAGNOSIS — H25812 Combined forms of age-related cataract, left eye: Secondary | ICD-10-CM | POA: Diagnosis not present

## 2022-12-24 DIAGNOSIS — H52222 Regular astigmatism, left eye: Secondary | ICD-10-CM | POA: Diagnosis not present

## 2023-01-03 ENCOUNTER — Other Ambulatory Visit: Payer: Self-pay | Admitting: *Deleted

## 2023-01-05 ENCOUNTER — Other Ambulatory Visit: Payer: Self-pay

## 2023-01-05 MED ORDER — POTASSIUM CHLORIDE CRYS ER 10 MEQ PO TBCR: EXTENDED_RELEASE_TABLET | ORAL | 3 refills | Status: DC

## 2023-01-11 DIAGNOSIS — I5022 Chronic systolic (congestive) heart failure: Secondary | ICD-10-CM | POA: Diagnosis not present

## 2023-01-11 DIAGNOSIS — E1122 Type 2 diabetes mellitus with diabetic chronic kidney disease: Secondary | ICD-10-CM | POA: Diagnosis not present

## 2023-01-11 DIAGNOSIS — Z95810 Presence of automatic (implantable) cardiac defibrillator: Secondary | ICD-10-CM | POA: Diagnosis not present

## 2023-01-11 DIAGNOSIS — I2510 Atherosclerotic heart disease of native coronary artery without angina pectoris: Secondary | ICD-10-CM | POA: Diagnosis not present

## 2023-01-11 DIAGNOSIS — I130 Hypertensive heart and chronic kidney disease with heart failure and stage 1 through stage 4 chronic kidney disease, or unspecified chronic kidney disease: Secondary | ICD-10-CM | POA: Diagnosis not present

## 2023-01-11 DIAGNOSIS — E7800 Pure hypercholesterolemia, unspecified: Secondary | ICD-10-CM | POA: Diagnosis not present

## 2023-01-11 DIAGNOSIS — N184 Chronic kidney disease, stage 4 (severe): Secondary | ICD-10-CM | POA: Diagnosis not present

## 2023-01-11 DIAGNOSIS — Z794 Long term (current) use of insulin: Secondary | ICD-10-CM | POA: Diagnosis not present

## 2023-01-11 DIAGNOSIS — F3341 Major depressive disorder, recurrent, in partial remission: Secondary | ICD-10-CM | POA: Diagnosis not present

## 2023-01-14 DIAGNOSIS — Z7682 Awaiting organ transplant status: Secondary | ICD-10-CM | POA: Diagnosis not present

## 2023-01-17 DIAGNOSIS — N184 Chronic kidney disease, stage 4 (severe): Secondary | ICD-10-CM | POA: Diagnosis not present

## 2023-01-17 DIAGNOSIS — Z0183 Encounter for blood typing: Secondary | ICD-10-CM | POA: Diagnosis not present

## 2023-01-17 DIAGNOSIS — Z01818 Encounter for other preprocedural examination: Secondary | ICD-10-CM | POA: Diagnosis not present

## 2023-01-27 DIAGNOSIS — Z833 Family history of diabetes mellitus: Secondary | ICD-10-CM | POA: Diagnosis not present

## 2023-01-27 DIAGNOSIS — E1122 Type 2 diabetes mellitus with diabetic chronic kidney disease: Secondary | ICD-10-CM | POA: Diagnosis not present

## 2023-01-27 DIAGNOSIS — Z794 Long term (current) use of insulin: Secondary | ICD-10-CM | POA: Diagnosis not present

## 2023-01-27 DIAGNOSIS — I2510 Atherosclerotic heart disease of native coronary artery without angina pectoris: Secondary | ICD-10-CM | POA: Diagnosis not present

## 2023-01-27 DIAGNOSIS — E1151 Type 2 diabetes mellitus with diabetic peripheral angiopathy without gangrene: Secondary | ICD-10-CM | POA: Diagnosis not present

## 2023-01-27 DIAGNOSIS — N185 Chronic kidney disease, stage 5: Secondary | ICD-10-CM | POA: Diagnosis not present

## 2023-01-27 DIAGNOSIS — Z8673 Personal history of transient ischemic attack (TIA), and cerebral infarction without residual deficits: Secondary | ICD-10-CM | POA: Diagnosis not present

## 2023-01-28 DIAGNOSIS — N2581 Secondary hyperparathyroidism of renal origin: Secondary | ICD-10-CM | POA: Diagnosis not present

## 2023-01-28 DIAGNOSIS — N184 Chronic kidney disease, stage 4 (severe): Secondary | ICD-10-CM | POA: Diagnosis not present

## 2023-01-28 DIAGNOSIS — D631 Anemia in chronic kidney disease: Secondary | ICD-10-CM | POA: Diagnosis not present

## 2023-01-28 DIAGNOSIS — I129 Hypertensive chronic kidney disease with stage 1 through stage 4 chronic kidney disease, or unspecified chronic kidney disease: Secondary | ICD-10-CM | POA: Diagnosis not present

## 2023-02-22 ENCOUNTER — Ambulatory Visit (INDEPENDENT_AMBULATORY_CARE_PROVIDER_SITE_OTHER): Payer: Medicare HMO

## 2023-02-22 DIAGNOSIS — I255 Ischemic cardiomyopathy: Secondary | ICD-10-CM | POA: Diagnosis not present

## 2023-02-23 LAB — CUP PACEART REMOTE DEVICE CHECK
Battery Remaining Longevity: 9 mo
Battery Voltage: 2.86 V
Brady Statistic AP VP Percent: 0.04 %
Brady Statistic AP VS Percent: 0.04 %
Brady Statistic AS VP Percent: 98.57 %
Brady Statistic AS VS Percent: 1.35 %
Brady Statistic RA Percent Paced: 0.08 %
Brady Statistic RV Percent Paced: 0.11 %
Date Time Interrogation Session: 20240416001703
HighPow Impedance: 36 Ohm
HighPow Impedance: 50 Ohm
Implantable Lead Connection Status: 753985
Implantable Lead Connection Status: 753985
Implantable Lead Connection Status: 753985
Implantable Lead Implant Date: 20090610
Implantable Lead Implant Date: 20090610
Implantable Lead Implant Date: 20090610
Implantable Lead Location: 753858
Implantable Lead Location: 753859
Implantable Lead Location: 753860
Implantable Lead Model: 4194
Implantable Lead Model: 5076
Implantable Lead Model: 6947
Implantable Pulse Generator Implant Date: 20160323
Lead Channel Impedance Value: 285 Ohm
Lead Channel Impedance Value: 342 Ohm
Lead Channel Impedance Value: 418 Ohm
Lead Channel Impedance Value: 456 Ohm
Lead Channel Impedance Value: 513 Ohm
Lead Channel Impedance Value: 646 Ohm
Lead Channel Pacing Threshold Amplitude: 0.75 V
Lead Channel Pacing Threshold Amplitude: 0.75 V
Lead Channel Pacing Threshold Amplitude: 1.375 V
Lead Channel Pacing Threshold Pulse Width: 0.4 ms
Lead Channel Pacing Threshold Pulse Width: 0.4 ms
Lead Channel Pacing Threshold Pulse Width: 0.4 ms
Lead Channel Sensing Intrinsic Amplitude: 2.125 mV
Lead Channel Sensing Intrinsic Amplitude: 2.125 mV
Lead Channel Sensing Intrinsic Amplitude: 4.375 mV
Lead Channel Sensing Intrinsic Amplitude: 4.375 mV
Lead Channel Setting Pacing Amplitude: 1.5 V
Lead Channel Setting Pacing Amplitude: 2 V
Lead Channel Setting Pacing Amplitude: 3 V
Lead Channel Setting Pacing Pulse Width: 0.4 ms
Lead Channel Setting Pacing Pulse Width: 0.4 ms
Lead Channel Setting Sensing Sensitivity: 0.3 mV
Zone Setting Status: 755011
Zone Setting Status: 755011

## 2023-03-21 ENCOUNTER — Other Ambulatory Visit: Payer: Self-pay | Admitting: Cardiovascular Disease

## 2023-03-28 NOTE — Progress Notes (Signed)
Remote ICD transmission.   

## 2023-04-07 DIAGNOSIS — I25709 Atherosclerosis of coronary artery bypass graft(s), unspecified, with unspecified angina pectoris: Secondary | ICD-10-CM | POA: Diagnosis not present

## 2023-04-07 DIAGNOSIS — E1121 Type 2 diabetes mellitus with diabetic nephropathy: Secondary | ICD-10-CM | POA: Diagnosis not present

## 2023-04-07 DIAGNOSIS — N184 Chronic kidney disease, stage 4 (severe): Secondary | ICD-10-CM | POA: Diagnosis not present

## 2023-04-07 DIAGNOSIS — I10 Essential (primary) hypertension: Secondary | ICD-10-CM | POA: Diagnosis not present

## 2023-04-07 DIAGNOSIS — Z7682 Awaiting organ transplant status: Secondary | ICD-10-CM | POA: Diagnosis not present

## 2023-04-07 DIAGNOSIS — Z1159 Encounter for screening for other viral diseases: Secondary | ICD-10-CM | POA: Diagnosis not present

## 2023-04-07 DIAGNOSIS — I255 Ischemic cardiomyopathy: Secondary | ICD-10-CM | POA: Diagnosis not present

## 2023-04-07 DIAGNOSIS — Z114 Encounter for screening for human immunodeficiency virus [HIV]: Secondary | ICD-10-CM | POA: Diagnosis not present

## 2023-04-11 DIAGNOSIS — H6123 Impacted cerumen, bilateral: Secondary | ICD-10-CM | POA: Diagnosis not present

## 2023-04-28 DIAGNOSIS — N184 Chronic kidney disease, stage 4 (severe): Secondary | ICD-10-CM | POA: Diagnosis not present

## 2023-04-29 ENCOUNTER — Ambulatory Visit: Payer: Medicare HMO | Attending: Cardiovascular Disease | Admitting: Cardiovascular Disease

## 2023-04-29 ENCOUNTER — Encounter: Payer: Self-pay | Admitting: Cardiovascular Disease

## 2023-04-29 VITALS — BP 110/68 | HR 75 | Ht 63.5 in | Wt 117.0 lb

## 2023-04-29 DIAGNOSIS — N183 Chronic kidney disease, stage 3 unspecified: Secondary | ICD-10-CM | POA: Diagnosis not present

## 2023-04-29 DIAGNOSIS — I255 Ischemic cardiomyopathy: Secondary | ICD-10-CM | POA: Diagnosis not present

## 2023-04-29 DIAGNOSIS — E782 Mixed hyperlipidemia: Secondary | ICD-10-CM | POA: Diagnosis not present

## 2023-04-29 DIAGNOSIS — Z95810 Presence of automatic (implantable) cardiac defibrillator: Secondary | ICD-10-CM

## 2023-04-29 DIAGNOSIS — I471 Supraventricular tachycardia, unspecified: Secondary | ICD-10-CM | POA: Diagnosis not present

## 2023-04-29 DIAGNOSIS — I10 Essential (primary) hypertension: Secondary | ICD-10-CM | POA: Diagnosis not present

## 2023-04-29 DIAGNOSIS — I25709 Atherosclerosis of coronary artery bypass graft(s), unspecified, with unspecified angina pectoris: Secondary | ICD-10-CM | POA: Diagnosis not present

## 2023-04-29 NOTE — Assessment & Plan Note (Signed)
History of BiV ICD with generator rater changed by Dr. Ladona Ridgel 01/29/2015 who follows her.

## 2023-04-29 NOTE — Assessment & Plan Note (Signed)
Stage III on renal transplant list at [facility].

## 2023-04-29 NOTE — Assessment & Plan Note (Signed)
History of CAD status post coronary artery bypass grafting in 1998 with a LIMA to LAD, vein to diagonal branch and to the PDA.  She has had multiple catheterizations since that time most recently by Dr. Clifton James 08/04/2018 revealing a patent LIMA to the LAD, patent vein to diagonal branch and patent vein to the PDA.  She is asymptomatic.

## 2023-04-29 NOTE — Assessment & Plan Note (Signed)
History of essential hypertension blood pressure measured today at 110/68.  She is on hydralazine, metoprolol and olmesartan.

## 2023-04-29 NOTE — Assessment & Plan Note (Signed)
History of ischemic cardiomyopathy with her most recent echo performed 09/29/2022 revealing EF of 45 to 50% with mild MR.  This represents a slight decline compared to her echo 01/24/2020 at which time her EF was 50 to 55%.  She is completely asymptomatic.

## 2023-04-29 NOTE — Patient Instructions (Signed)
Medication Instructions:  Your physician recommends that you continue on your current medications as directed. Please refer to the Current Medication list given to you today.  *If you need a refill on your cardiac medications before your next appointment, please call your pharmacy*   Follow-Up: At [facility], you and your health needs are our priority.  As part of our continuing mission to provide you with exceptional heart care, we have created designated Provider Care Teams.  These Care Teams include your primary Cardiologist (physician) and Advanced Practice Providers (APPs -  Physician Assistants and Nurse Practitioners) who all work together to provide you with the care you need, when you need it.  We recommend signing up for the patient portal called "MyChart".  Sign up information is provided on this After Visit Summary.  MyChart is used to connect with patients for Virtual Visits (Telemedicine).  Patients are able to view lab/test results, encounter notes, upcoming appointments, etc.  Non-urgent messages can be sent to your provider as well.   To learn more about what you can do with MyChart, go to [email or web address].    Your next appointment:   12 month(s)  Provider:   Jonathan Berry, MD    

## 2023-04-29 NOTE — Progress Notes (Signed)
04/29/2023 [name]   October 28, 1951  [id number]  Primary Physician Merri Brunette, MD Primary Cardiologist: Runell Gess MD Roseanne Reno  HPI:  [name] is a 72 y.o. thin-appearing married African-American female mother of 1 son who was previously patient of Dr. Erskine Emery Smith's.  She is transitioning her care to me after his retirement.  He, her husband was in a motor vehicle accident and currently is in [facility] for rehab.  He does have dementia.  I apparently knew her back in the 90s and I performed multiple Catheterizations on her prior to her bypass surgery.  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  Her risk factors include treated hypertension, diabetes and hyperlipidemia.  Her father, mother and brother are all deceased and all had cardiovascular disease.  She had bypass surgery 1998 with a LIMA to LAD, vein to diagonal branch and PDA.  She is also had an ICD placed with a generator change by Dr. Ladona Ridgel in 2016 . Her EF   is in the 45 to 50% range.  She does have CKD 3 and is on the renal transplant list at [facility].   Current Meds  Medication Sig   albuterol (PROVENTIL HFA;VENTOLIN HFA) 108 (90 BASE) MCG/ACT inhaler Inhale 2 puffs into the lungs every 4 (four) hours as needed for wheezing or shortness of breath.   Alcohol Swabs (B-D SINGLE USE SWABS REGULAR) PADS    ALPRAZolam (XANAX) 1 MG tablet Take 1 mg by mouth 3 (three) times daily.    Artificial Tear GEL Place 2 drops into both eyes daily as needed (dry eyes).    aspirin 325 MG EC tablet Take 325 mg by mouth daily.   BIOTIN PO Take 1 tablet by mouth daily.   cetirizine (ZYRTEC) 10 MG tablet Take 10 mg by mouth daily.   Cholecalciferol (VITAMIN D-3) 1000 UNITS CAPS Take 1,000 Units by mouth daily.    clopidogrel (PLAVIX) 75 MG tablet TAKE 1 TABLET EVERY DAY   Docusate Sodium (DSS) 250 MG CAPS Take 1 capsule by mouth as  needed.   doxazosin (CARDURA) 4 MG tablet Take 4 mg by mouth at bedtime.    DROPLET PEN NEEDLES 32G X 4 MM MISC    EPINEPHrine 0.3 mg/0.3 mL IJ SOAJ injection Inject 0.3 mg into the muscle as needed for anaphylaxis.    ezetimibe (ZETIA) 10 MG tablet TAKE 1 TABLET EVERY DAY   Flaxseed, Linseed, (FLAXSEED OIL) 1200 MG CAPS Take 1,200 mg by mouth daily.   fluticasone (FLONASE) 50 MCG/ACT nasal spray Place 2 sprays into both nostrils daily as needed for allergies or rhinitis.   furosemide (LASIX) 80 MG tablet TAKE 1 TABLET TWICE A DAY. MAY TAKE AN ADDITIONAL 80MG  (1 TABLET) AS NEEDED FOR EDEMA   hydrALAZINE (APRESOLINE) 25 MG tablet TAKE 1 TABLET TWICE DAILY   isosorbide mononitrate (IMDUR) 60 MG 24 hr tablet TAKE 1 TABLET EVERY DAY   Ketotifen Fumarate (ALAWAY OP) Place 1 drop into both eyes daily as needed (allergies).    metoprolol succinate (TOPROL-XL) 100 MG 24 hr tablet TAKE 2 TABLETS EVERY DAY WITH OR IMMEDIATELY FOLLOWING A MEAL   Multiple Vitamin (MULTIVITAMIN WITH MINERALS) TABS Take 1 tablet by mouth daily.   nitroGLYCERIN (NITROSTAT) 0.4 MG SL tablet Place 1 tablet under the tongue every 5 minutes as needed for chest pain, max 3 doses, go to er if  no relief   olmesartan (BENICAR) 40 MG tablet TAKE 1 TABLET EVERY DAY   ondansetron (ZOFRAN) 8 MG tablet Take 8 mg by mouth every 8 (eight) hours as needed for nausea or vomiting.   OZEMPIC, 0.25 OR 0.5 MG/DOSE, 2 MG/1.5ML SOPN Inject 0.25 mg into the skin once a week. Sunday   pantoprazole (PROTONIX) 40 MG tablet TAKE 1 TABLET BY MOUTH  DAILY   potassium chloride (KLOR-CON M) 10 MEQ tablet TAKE 2 TABLETS TWICE DAILY. *Please keep upcoming appointment for future refills. Thank you   simvastatin (ZOCOR) 40 MG tablet TAKE 1 TABLET (40 MG TOTAL) BY MOUTH AT BEDTIME.   sodium chloride (MURO 128) 5 % ophthalmic ointment Place 1 drop into both eyes at bedtime. For dry eyes   traMADol (ULTRAM) 50 MG tablet Take 100 mg by mouth every 6 (six) hours as  needed (MIGRAINES).   TRUE METRIX BLOOD GLUCOSE TEST test strip    TRUEplus Lancets 33G MISC    venlafaxine (EFFEXOR) 100 MG tablet Take 100-200 mg by mouth See admin instructions. TAKE 200 mg by mouth in the morning and take 100 mg at noon   vitamin A 7500 UNIT capsule Take 2,400 Units by mouth daily.     Allergies  Allergen Reactions   Calcium Channel Blockers Other (See Comments)    Cannot take non-DHP CCBs (diltiazem, verapamil) due to CHF requiring ICD   Codeine Anaphylaxis and Other (See Comments)   Lyrica [Pregabalin]     Nightmares, and swelling   Other Anaphylaxis and Other (See Comments)    Walnuts, pecans, and ANY melons PATIENT IS A VEGETARIAN    Ramipril Anaphylaxis, Swelling and Other (See Comments)   Ticlid [Ticlopidine Hcl] Other (See Comments)    Unprovoked bleeding while on Ticlid (doesn't think she was on ASA at the time) Takes Plavix without problems   Morphine And Codeine Other (See Comments)    Severe AMS, confusion, weakness Tolerates Fentanyl, Tramadol   Tape Dermatitis and Rash    Tolerates paper tape   Digoxin Other (See Comments)   Digoxin And Related Nausea Only    Unknown   Diltiazem Hcl Other (See Comments)   Diltiazem Hcl Er Beads Other (See Comments)   Metformin Hcl Other (See Comments)   Metolazone Other (See Comments)    Cannot remember reaction   Milk-Related Compounds Other (See Comments)    Bad gas   Morphine Sulfate Other (See Comments)   Myrbetriq [Mirabegron] Other (See Comments)    Aggravates migraines   Nsaids Other (See Comments)   Onglyza [Saxagliptin] Other (See Comments)    Exacerbates migraines   Penicillin V Other (See Comments)   Penicillins Hives    Has patient had a PCN reaction causing immediate rash, facial/tongue/throat swelling, SOB or lightheadedness with hypotension: Yes Has patient had a PCN reaction causing severe rash involving mucus membranes or skin necrosis: Unknown Has patient had a PCN reaction that  required hospitalization: Unknown Has patient had a PCN reaction occurring within the last 10 years: No If all of the above answers are "NO", then may proceed with Cephalosporin use.    Spironolactone Other (See Comments)    Cannot remember reaction   Sulfa Antibiotics Hives and Other (See Comments)   Tetracycline Hcl Other (See Comments)   Tetracyclines & Related Other (See Comments)    Cannot remember reaction Tolerates macrolides (azithromycin)   Ticlopidine Other (See Comments)   Verapamil Other (See Comments)    Pacemaker/CHF   Vicodin [Hydrocodone-Acetaminophen]  Other (See Comments)    EXTREME LETHARGY   Latex Itching, Rash and Other (See Comments)    Social History   Socioeconomic History   Marital status: Married    Spouse name: Peyton Najjar   Number of children: 1   Years of education: MA   Highest education level: Not on file  Occupational History    Employer: UNEMPLOYED    Comment: Disablity  Tobacco Use   Smoking status: Never   Smokeless tobacco: Never  Vaping Use   Vaping Use: Never used  Substance and Sexual Activity   Alcohol use: No    Alcohol/week: 0.0 standard drinks of alcohol   Drug use: No   Sexual activity: Yes    Birth control/protection: Surgical  Other Topics Concern   Not on file  Social History Narrative   Patient lives at home with spouse.     Daughter name is Programmer, multimedia.   Caffeine Use: none   Social Determinants of Corporate investment banker Strain: Not on file  Food Insecurity: Not on file  Transportation Needs: Not on file  Physical Activity: Not on file  Stress: Not on file  Social Connections: Not on file  Intimate Partner Violence: Not on file     Review of Systems: General: negative for chills, fever, night sweats or weight changes.  Cardiovascular: negative for chest pain, dyspnea on exertion, edema, orthopnea, palpitations, paroxysmal nocturnal dyspnea or shortness of breath Dermatological: negative for rash Respiratory:  negative for cough or wheezing Urologic: negative for hematuria Abdominal: negative for nausea, vomiting, diarrhea, bright red blood per rectum, melena, or hematemesis Neurologic: negative for visual changes, syncope, or dizziness All other systems reviewed and are otherwise negative except as noted above.    Blood pressure 110/68, pulse 75, height 5' 3.5" (1.613 m), weight 117 lb (53.1 kg), SpO2 100 %.  General appearance: alert and no distress Neck: no adenopathy, no carotid bruit, no JVD, supple, symmetrical, trachea midline, and thyroid not enlarged, symmetric, no tenderness/mass/nodules Lungs: clear to auscultation bilaterally Heart: regular rate and rhythm, S1, S2 normal, no murmur, click, rub or gallop Extremities: extremities normal, atraumatic, no cyanosis or edema Pulses: 2+ and symmetric Skin: Skin color, texture, turgor normal. No rashes or lesions Neurologic: Grossly normal  EKG not performed today      ASSESSMENT AND PLAN:   Biventricular implantable cardioverter-defibrillator in situ History of BiV ICD with generator rater changed by Dr. Ladona Ridgel 01/29/2015 who follows her.  HTN (hypertension) History of essential hypertension blood pressure measured today at 110/68.  She is on hydralazine, metoprolol and olmesartan.  Chronic renal insufficiency, stage III (moderate) (HCC) Stage III on renal transplant list at [facility].  Coronary artery disease involving coronary bypass graft of native heart with angina pectoris (HCC) History of CAD status post coronary artery bypass grafting in 1998 with a LIMA to LAD, vein to diagonal branch and to the PDA.  She has had multiple catheterizations since that time most recently by Dr. Clifton James 08/04/2018 revealing a patent LIMA to the LAD, patent vein to diagonal branch and patent vein to the PDA.  She is asymptomatic.  Ischemic cardiomyopathy History of ischemic cardiomyopathy with her most recent echo performed 09/29/2022 revealing EF of  45 to 50% with mild MR.  This represents a slight decline compared to her echo 01/24/2020 at which time her EF was 50 to 55%.  She is completely asymptomatic.  Hyperlipidemia History of hyperlipidemia on statin therapy and Zetia with lipid profile performed 07/13/2022 revealing  a total cholesterol 163, LDL 69 and HDL 75.     Runell Gess MD [facility], [facility] 04/29/2023 3:05 PM

## 2023-04-29 NOTE — Assessment & Plan Note (Signed)
History of hyperlipidemia on statin therapy and Zetia with lipid profile performed 07/13/2022 revealing a total cholesterol 163, LDL 69 and HDL 75.

## 2023-05-04 DIAGNOSIS — N2581 Secondary hyperparathyroidism of renal origin: Secondary | ICD-10-CM | POA: Diagnosis not present

## 2023-05-04 DIAGNOSIS — D631 Anemia in chronic kidney disease: Secondary | ICD-10-CM | POA: Diagnosis not present

## 2023-05-04 DIAGNOSIS — N184 Chronic kidney disease, stage 4 (severe): Secondary | ICD-10-CM | POA: Diagnosis not present

## 2023-05-04 DIAGNOSIS — I129 Hypertensive chronic kidney disease with stage 1 through stage 4 chronic kidney disease, or unspecified chronic kidney disease: Secondary | ICD-10-CM | POA: Diagnosis not present

## 2023-05-16 ENCOUNTER — Other Ambulatory Visit: Payer: Self-pay

## 2023-05-16 DIAGNOSIS — I5030 Unspecified diastolic (congestive) heart failure: Secondary | ICD-10-CM

## 2023-05-16 NOTE — Addendum Note (Signed)
Addended by: Bernita Buffy on: 05/16/2023 12:57 PM   Modules accepted: Orders

## 2023-05-23 ENCOUNTER — Telehealth (HOSPITAL_COMMUNITY): Payer: Self-pay

## 2023-05-23 NOTE — Telephone Encounter (Signed)
Called patient to see if she was interested in participating in the Pulmonary Rehab Program. Patient stated yes. Patient will come in for orientation on 7/26@1  and will attend the 1:15 exercise class.   Sent package.

## 2023-05-24 ENCOUNTER — Ambulatory Visit (INDEPENDENT_AMBULATORY_CARE_PROVIDER_SITE_OTHER): Payer: Medicare HMO

## 2023-05-24 DIAGNOSIS — I255 Ischemic cardiomyopathy: Secondary | ICD-10-CM | POA: Diagnosis not present

## 2023-05-26 LAB — CUP PACEART REMOTE DEVICE CHECK
Battery Remaining Longevity: 7 mo
Battery Voltage: 2.85 V
Brady Statistic AP VP Percent: 0.02 %
Brady Statistic AP VS Percent: 0.04 %
Brady Statistic AS VP Percent: 98.59 %
Brady Statistic AS VS Percent: 1.35 %
Brady Statistic RA Percent Paced: 0.06 %
Brady Statistic RV Percent Paced: 0.06 %
Date Time Interrogation Session: 20240716033324
HighPow Impedance: 35 Ohm
HighPow Impedance: 47 Ohm
Implantable Lead Connection Status: 753985
Implantable Lead Connection Status: 753985
Implantable Lead Connection Status: 753985
Implantable Lead Implant Date: 20090610
Implantable Lead Implant Date: 20090610
Implantable Lead Implant Date: 20090610
Implantable Lead Location: 753858
Implantable Lead Location: 753859
Implantable Lead Location: 753860
Implantable Lead Model: 4194
Implantable Lead Model: 5076
Implantable Lead Model: 6947
Implantable Pulse Generator Implant Date: 20160323
Lead Channel Impedance Value: 247 Ohm
Lead Channel Impedance Value: 361 Ohm
Lead Channel Impedance Value: 418 Ohm
Lead Channel Impedance Value: 418 Ohm
Lead Channel Impedance Value: 475 Ohm
Lead Channel Impedance Value: 608 Ohm
Lead Channel Pacing Threshold Amplitude: 0.75 V
Lead Channel Pacing Threshold Amplitude: 0.875 V
Lead Channel Pacing Threshold Amplitude: 0.875 V
Lead Channel Pacing Threshold Pulse Width: 0.4 ms
Lead Channel Pacing Threshold Pulse Width: 0.4 ms
Lead Channel Pacing Threshold Pulse Width: 0.4 ms
Lead Channel Sensing Intrinsic Amplitude: 1.625 mV
Lead Channel Sensing Intrinsic Amplitude: 1.625 mV
Lead Channel Sensing Intrinsic Amplitude: 5.125 mV
Lead Channel Sensing Intrinsic Amplitude: 5.125 mV
Lead Channel Setting Pacing Amplitude: 1.75 V
Lead Channel Setting Pacing Amplitude: 2 V
Lead Channel Setting Pacing Amplitude: 2 V
Lead Channel Setting Pacing Pulse Width: 0.4 ms
Lead Channel Setting Pacing Pulse Width: 0.4 ms
Lead Channel Setting Sensing Sensitivity: 0.3 mV
Zone Setting Status: 755011
Zone Setting Status: 755011

## 2023-05-27 ENCOUNTER — Telehealth: Payer: Self-pay

## 2023-05-27 NOTE — Telephone Encounter (Signed)
Patient called wanting to discuss her battery.  She had questions regarding monitoring until reaches replacement mark, she is worried it will get missed.   I reassured her that her transmissions are coming through and that we will start monthly checks once we reach to RRT.  Also, explained what RRT means.  In addition, reassured her that if she has any concerns to feel free to call us during this monitoring period.  Made her aware that if we do become concerned that she needs more than remote monitoring, we could bring her in monthly as we near RRT indicator, but as her remotes are communicating accurately there would be no need in that at this point.   Patient allowed to ask questions and stated that she felt reassured in our procedures moving forward.  She has our number to call if any further concerns.

## 2023-05-31 DIAGNOSIS — Z833 Family history of diabetes mellitus: Secondary | ICD-10-CM | POA: Diagnosis not present

## 2023-05-31 DIAGNOSIS — Z8673 Personal history of transient ischemic attack (TIA), and cerebral infarction without residual deficits: Secondary | ICD-10-CM | POA: Diagnosis not present

## 2023-05-31 DIAGNOSIS — E1151 Type 2 diabetes mellitus with diabetic peripheral angiopathy without gangrene: Secondary | ICD-10-CM | POA: Diagnosis not present

## 2023-05-31 DIAGNOSIS — N185 Chronic kidney disease, stage 5: Secondary | ICD-10-CM | POA: Diagnosis not present

## 2023-05-31 DIAGNOSIS — I2510 Atherosclerotic heart disease of native coronary artery without angina pectoris: Secondary | ICD-10-CM | POA: Diagnosis not present

## 2023-06-02 ENCOUNTER — Telehealth (HOSPITAL_COMMUNITY): Payer: Self-pay

## 2023-06-02 NOTE — Telephone Encounter (Signed)
Called to confirm appt. Pt confirmed appt. Instructed pt on proper footwear. Gave directions along with department number.

## 2023-06-03 ENCOUNTER — Encounter (HOSPITAL_COMMUNITY): Payer: Self-pay

## 2023-06-03 ENCOUNTER — Encounter (HOSPITAL_COMMUNITY)
Admission: RE | Admit: 2023-06-03 | Discharge: 2023-06-03 | Disposition: A | Discharge status: Home / Self Care | Payer: Medicare HMO | Source: Ambulatory Visit | Attending: Cardiovascular Disease | Admitting: Cardiovascular Disease

## 2023-06-03 VITALS — BP 128/64 | HR 76 | Resp 16 | Ht 63.5 in | Wt 118.2 lb

## 2023-06-03 DIAGNOSIS — I5032 Chronic diastolic (congestive) heart failure: Secondary | ICD-10-CM | POA: Diagnosis not present

## 2023-06-03 NOTE — Progress Notes (Signed)
Pulmonary Individual Treatment Plan  Patient Details  Name: [name] MRN: [id number] Date of Birth: [birth year] Referring Provider:   Doristine Devoid Pulmonary Rehab Walk Test from 06/03/2023 in [facility]  Referring Provider Allyson Sabal       Initial Encounter Date:  Flowsheet Row Pulmonary Rehab Walk Test from 06/03/2023 in [facility]  Date 06/03/23       Visit Diagnosis: Heart failure, diastolic, chronic (HCC)  Patient's Home Medications on Admission:   Current Outpatient Medications:    albuterol (PROVENTIL HFA;VENTOLIN HFA) 108 (90 BASE) MCG/ACT inhaler, Inhale 2 puffs into the lungs every 4 (four) hours as needed for wheezing or shortness of breath., Disp: , Rfl:    Alcohol Swabs (B-D SINGLE USE SWABS REGULAR) PADS, , Disp: , Rfl:    ALPRAZolam (XANAX) 1 MG tablet, Take 1 mg by mouth 3 (three) times daily. , Disp: , Rfl: 2   Artificial Tear GEL, Place 2 drops into both eyes daily as needed (dry eyes). , Disp: , Rfl:    aspirin 325 MG EC tablet, Take 325 mg by mouth daily., Disp: , Rfl:    BIOTIN PO, Take 1 tablet by mouth daily., Disp: , Rfl:    cetirizine (ZYRTEC) 10 MG tablet, Take 10 mg by mouth daily., Disp: , Rfl:    Cholecalciferol (VITAMIN D-3) 1000 UNITS CAPS, Take 1,000 Units by mouth daily. , Disp: , Rfl:    clopidogrel (PLAVIX) 75 MG tablet, TAKE 1 TABLET EVERY DAY, Disp: 90 tablet, Rfl: 3   Docusate Sodium (DSS) 250 MG CAPS, Take 1 capsule by mouth as needed., Disp: , Rfl:    doxazosin (CARDURA) 4 MG tablet, Take 4 mg by mouth at bedtime. , Disp: , Rfl:    EPINEPHrine 0.3 mg/0.3 mL IJ SOAJ injection, Inject 0.3 mg into the muscle as needed for anaphylaxis. , Disp: , Rfl: 3   ezetimibe (ZETIA) 10 MG tablet, TAKE 1 TABLET EVERY DAY, Disp: 90 tablet, Rfl: 3   Flaxseed, Linseed, (FLAXSEED OIL) 1200 MG CAPS, Take 1,200 mg by mouth daily., Disp: ,  Rfl:    fluticasone (FLONASE) 50 MCG/ACT nasal spray, Place 2 sprays into both nostrils daily as needed for allergies or rhinitis., Disp: , Rfl:    furosemide (LASIX) 80 MG tablet, TAKE 1 TABLET TWICE A DAY. MAY TAKE AN ADDITIONAL 80MG  (1 TABLET) AS NEEDED FOR EDEMA, Disp: 270 tablet, Rfl: 3   hydrALAZINE (APRESOLINE) 25 MG tablet, TAKE 1 TABLET TWICE DAILY, Disp: 180 tablet, Rfl: 3   isosorbide mononitrate (IMDUR) 60 MG 24 hr tablet, TAKE 1 TABLET EVERY DAY, Disp: 90 tablet, Rfl: 3   Ketotifen Fumarate (ALAWAY OP), Place 1 drop into both eyes daily as needed (allergies). , Disp: , Rfl:    metoprolol succinate (TOPROL-XL) 100 MG 24 hr tablet, TAKE 2 TABLETS EVERY DAY WITH OR IMMEDIATELY FOLLOWING A MEAL, Disp: 180 tablet, Rfl: 3   Multiple Vitamin (MULTIVITAMIN WITH MINERALS) TABS, Take 1 tablet by mouth daily., Disp: , Rfl:    nitroGLYCERIN (NITROSTAT) 0.4 MG SL tablet, Place 1 tablet under the tongue every 5 minutes as needed for chest pain, max 3 doses, go to er if no relief, Disp: 75 tablet, Rfl: 2   olmesartan (BENICAR) 40 MG tablet, TAKE 1 TABLET EVERY DAY, Disp: 90 tablet, Rfl: 3   ondansetron (ZOFRAN) 8 MG tablet, Take 8 mg by mouth every 8 (  eight) hours as needed for nausea or vomiting., Disp: , Rfl:    OZEMPIC, 0.25 OR 0.5 MG/DOSE, 2 MG/1.5ML SOPN, Inject 0.25 mg into the skin once a week. Sunday, Disp: , Rfl:    pantoprazole (PROTONIX) 40 MG tablet, TAKE 1 TABLET BY MOUTH  DAILY, Disp: 90 tablet, Rfl: 3   potassium chloride (KLOR-CON M) 10 MEQ tablet, TAKE 2 TABLETS TWICE DAILY. *Please keep upcoming appointment for future refills. Thank you, Disp: 360 tablet, Rfl: 0   simvastatin (ZOCOR) 40 MG tablet, TAKE 1 TABLET (40 MG TOTAL) BY MOUTH AT BEDTIME., Disp: 90 tablet, Rfl: 3   sodium chloride (MURO 128) 5 % ophthalmic ointment, Place 1 drop into both eyes at bedtime. For dry eyes, Disp: , Rfl:    traMADol (ULTRAM) 50 MG tablet, Take 100 mg by mouth every 6 (six) hours as needed  (MIGRAINES)., Disp: , Rfl:    TRUE METRIX BLOOD GLUCOSE TEST test strip, , Disp: , Rfl:    TRUEplus Lancets 33G MISC, , Disp: , Rfl:    venlafaxine (EFFEXOR) 100 MG tablet, Take 100-200 mg by mouth See admin instructions. TAKE 200 mg by mouth in the morning and take 100 mg at noon, Disp: , Rfl:    vitamin A 7500 UNIT capsule, Take 2,400 Units by mouth daily., Disp: , Rfl:    benzonatate (TESSALON) 100 MG capsule, Take 1 capsule (100 mg total) by mouth 3 (three) times daily as needed for cough. (Patient not taking: Reported on 04/29/2023), Disp: 30 capsule, Rfl: 0   cyclobenzaprine (FLEXERIL) 5 MG tablet, Take 1 tablet (5 mg total) by mouth at bedtime as needed for muscle spasms. (Patient not taking: Reported on 04/29/2023), Disp: 30 tablet, Rfl: 0   DROPLET PEN NEEDLES 32G X 4 MM MISC, , Disp: , Rfl:    LANTUS SOLOSTAR 100 UNIT/ML Solostar Pen, Inject 6 Units into the skin at bedtime.  (Patient not taking: Reported on 04/29/2023), Disp: , Rfl:   Past Medical History: Past Medical History:  Diagnosis Date   AICD (automatic cardioverter/defibrillator) present    [employer]- Dr. Rosette Reveal follows   Anginal pain (HCC)    Anxiety    Asthma    Back pain    "pinched nerve-lower back" - Dr. Ethelene Hal follows.   Biventricular implantable cardioverter-defibrillator in situ 06/18/2011   Cerebral infarction (HCC) 11/11/2012   Cervical dysplasia    Chronic diastolic heart failure (HCC) 03/22/2016   Chronic kidney disease    Dr. Allena Katz follows.   Chronic renal insufficiency, stage III (moderate) (HCC) 11/11/2012   Complication of anesthesia    "I wake up during surgeries" (02/14/2013)   Coronary artery disease involving coronary bypass graft of native heart with angina pectoris (HCC) 06/18/2011   Depression    DM (diabetes mellitus) (HCC) 11/11/2012   Fibroid    Function kidney decreased    GERD (gastroesophageal reflux disease)    Hemiplegia, unspecified, affecting nondominant side 11/11/2012   Hepatitis     Hepatitis A -college yrs"water source exposure"   Hiatal hernia    History of shingles    2-3 yrs ago last out break "around waist"   History of stomach ulcers    Hyperlipidemia 07/30/2016   Hypertension    ICD (implantable cardiac defibrillator) in place    Iron deficiency anemia    Ischemic cardiomyopathy    status post biventricular ICD placed by DR Edumunds who used to see Dr Elsie Lincoln here to establish  cardiovascular care.   Migraines  MVP (mitral valve prolapse)    Antibiotics not required for procedures   Myocardial infarction (HCC)    "I've had 2; the others they were able to catch before completing" (02/14/2013)   Pacemaker    Paroxysmal SVT (supraventricular tachycardia) 06/18/2011   Pneumonia 1950's & 1985   Shortness of breath    "lying down flat; at times w/exertion" (02/14/2013). 10-06-15 exertion only..   Stroke [facility])    "2 confirmed; 9 TIA's; results in dragging LLE; numbness in tip of tongue" (02/14/2013),10-06-15 right hand tends to be weaker when tired.   TIA (transient ischemic attack) 11/11/2012   Type II diabetes mellitus (HCC)     Tobacco Use: Social History   Tobacco Use  Smoking Status Never  Smokeless Tobacco Never    Labs: Review Flowsheet  More data exists      Latest Ref Rng & Units 01/03/2015 01/01/2016 01/02/2016 08/15/2018 04/06/2019  Labs for ITP Cardiac and Pulmonary Rehab  Cholestrol 100 - 199 mg/dL 536  - 644  034  742   LDL (calc) 0 - 99 mg/dL 70  - 66  72  92   HDL-C >39 mg/dL 54  - 41  49  61   Trlycerides 0 - 149 mg/dL 595  - 638  756  433   Hemoglobin A1c 4.8 - 5.6 % - - 6.9  - 6.5   TCO2 0 - 100 mmol/L - 25  - - -    Details            Capillary Blood Glucose: Lab Results  Component Value Date   GLUCAP 134 (H) 08/05/2018   GLUCAP 171 (H) 08/04/2018   GLUCAP 93 08/04/2018   GLUCAP 106 (H) 08/04/2018   GLUCAP 125 (H) 08/04/2018    POCT Glucose     Row Name 06/03/23 1337             POCT Blood Glucose   Pre-Exercise 128  mg/dL                Pulmonary Assessment Scores:  Pulmonary Assessment Scores     Row Name 06/03/23 1358         ADL UCSD   ADL Phase Entry     SOB Score total 47       CAT Score   CAT Score 18       mMRC Score   mMRC Score 3             UCSD: Self-administered rating of dyspnea associated with activities of daily living (ADLs) 6-point scale (0 = "not at all" to 5 = "maximal or unable to do because of breathlessness")  Scoring Scores range from 0 to 120.  Minimally important difference is 5 units  CAT: CAT can identify the health impairment of COPD patients and is better correlated with disease progression.  CAT has a scoring range of zero to 40. The CAT score is classified into four groups of low (less than 10), medium (10 - 20), high (21-30) and very high (31-40) based on the impact level of disease on health status. A CAT score over 10 suggests significant symptoms.  A worsening CAT score could be explained by an exacerbation, poor medication adherence, poor inhaler technique, or progression of COPD or comorbid conditions.  CAT MCID is 2 points  mMRC: mMRC (Modified Medical Research Council) Dyspnea Scale is used to assess the degree of baseline functional disability in patients of respiratory disease due to dyspnea. No minimal  important difference is established. A decrease in score of 1 point or greater is considered a positive change.   Pulmonary Function Assessment:  Pulmonary Function Assessment - 06/03/23 1340       Breath   Bilateral Breath Sounds Clear    Shortness of Breath Yes             Exercise Target Goals: Exercise Program Goal: Individual exercise prescription set using results from initial 6 min walk test and THRR while considering  patient's activity barriers and safety.   Exercise Prescription Goal: Initial exercise prescription builds to 30-45 minutes a day of aerobic activity, 2-3 days per week.  Home exercise guidelines will be  given to patient during program as part of exercise prescription that the participant will acknowledge.  Activity Barriers & Risk Stratification:  Activity Barriers & Cardiac Risk Stratification - 06/03/23 1339       Activity Barriers & Cardiac Risk Stratification   Activity Barriers Deconditioning;Muscular Weakness;Shortness of Breath;Balance Concerns             6 Minute Walk:  6 Minute Walk     Row Name 06/03/23 1545         6 Minute Walk   Phase Initial     Distance 1172 feet     Walk Time 6 minutes     # of Rest Breaks 0     MPH 2.22     METS 2.87     RPE 13     Perceived Dyspnea  1.5     VO2 Peak 10.03     Symptoms No     Resting HR 80 bpm     Resting BP 122/64     Resting Oxygen Saturation  99 %     Exercise Oxygen Saturation  during 6 min walk 94 %     Max Ex. HR 93 bpm     Max Ex. BP 118/70     2 Minute Post BP 114/68       Interval HR   1 Minute HR 83     2 Minute HR 83     3 Minute HR 80     4 Minute HR 87     5 Minute HR 92     6 Minute HR 93     2 Minute Post HR 73     Interval Heart Rate? Yes       Interval Oxygen   Interval Oxygen? Yes     Baseline Oxygen Saturation % 99 %     1 Minute Oxygen Saturation % 97 %     1 Minute Liters of Oxygen 0 L     2 Minute Oxygen Saturation % 94 %     2 Minute Liters of Oxygen 0 L     3 Minute Oxygen Saturation % 100 %     3 Minute Liters of Oxygen 0 L     4 Minute Oxygen Saturation % 100 %     4 Minute Liters of Oxygen 0 L     5 Minute Oxygen Saturation % 99 %     5 Minute Liters of Oxygen 0 L     6 Minute Oxygen Saturation % 100 %     6 Minute Liters of Oxygen 0 L     2 Minute Post Oxygen Saturation % 100 %     2 Minute Post Liters of Oxygen 0 L  Oxygen Initial Assessment:  Oxygen Initial Assessment - 06/03/23 1340       Home Oxygen   Home Oxygen Device None    Sleep Oxygen Prescription None    Home Exercise Oxygen Prescription None    Home Resting Oxygen Prescription None       Initial 6 min Walk   Oxygen Used None      Program Oxygen Prescription   Program Oxygen Prescription None      Intervention   Short Term Goals To learn and understand importance of maintaining oxygen saturations>88%;To learn and demonstrate proper use of respiratory medications;To learn and understand importance of monitoring SPO2 with pulse oximeter and demonstrate accurate use of the pulse oximeter.;To learn and demonstrate proper pursed lip breathing techniques or other breathing techniques.     Long  Term Goals Maintenance of O2 saturations>88%;Compliance with respiratory medication;Verbalizes importance of monitoring SPO2 with pulse oximeter and return demonstration;Exhibits proper breathing techniques, such as pursed lip breathing or other method taught during program session;Demonstrates proper use of MDI's             Oxygen Re-Evaluation:   Oxygen Discharge (Final Oxygen Re-Evaluation):   Initial Exercise Prescription:  Initial Exercise Prescription - 06/03/23 1400       Date of Initial Exercise RX and Referring Provider   Date 06/03/23    Referring Provider Allyson Sabal    Expected Discharge Date 09/01/23      Treadmill   MPH 2    Grade 0    Minutes 15    METs 2.53      Bike   Level 1    Watts 20    Minutes 15      Prescription Details   Frequency (times per week) 2    Duration Progress to 30 minutes of continuous aerobic without signs/symptoms of physical distress      Intensity   THRR 40-80% of Max Heartrate 60-121    Ratings of Perceived Exertion 11-13    Perceived Dyspnea 0-4      Progression   Progression Continue to progress workloads to maintain intensity without signs/symptoms of physical distress.      Resistance Training   Training Prescription Yes    Weight red bands    Reps 10-15             Perform Capillary Blood Glucose checks as needed.  Exercise Prescription Changes:   Exercise Comments:   Exercise Goals and  Review:   Exercise Goals     Row Name 06/03/23 1339             Exercise Goals   Increase Physical Activity Yes       Intervention Provide advice, education, support and counseling about physical activity/exercise needs.;Develop an individualized exercise prescription for aerobic and resistive training based on initial evaluation findings, risk stratification, comorbidities and participant's personal goals.       Expected Outcomes Short Term: Attend rehab on a regular basis to increase amount of physical activity.;Long Term: Exercising regularly at least 3-5 days a week.;Long Term: Add in home exercise to make exercise part of routine and to increase amount of physical activity.       Increase Strength and Stamina Yes       Intervention Provide advice, education, support and counseling about physical activity/exercise needs.;Develop an individualized exercise prescription for aerobic and resistive training based on initial evaluation findings, risk stratification, comorbidities and participant's personal goals.       Expected Outcomes Short  Term: Increase workloads from initial exercise prescription for resistance, speed, and METs.;Short Term: Perform resistance training exercises routinely during rehab and add in resistance training at home;Long Term: Improve cardiorespiratory fitness, muscular endurance and strength as measured by increased METs and functional capacity ( )       Able to understand and use rate of perceived exertion (RPE) scale Yes       Intervention Provide education and explanation on how to use RPE scale       Expected Outcomes Short Term: Able to use RPE daily in rehab to express subjective intensity level;Long Term:  Able to use RPE to guide intensity level when exercising independently       Able to understand and use Dyspnea scale Yes       Intervention Provide education and explanation on how to use Dyspnea scale       Expected Outcomes Short Term: Able to use  Dyspnea scale daily in rehab to express subjective sense of shortness of breath during exertion;Long Term: Able to use Dyspnea scale to guide intensity level when exercising independently       Knowledge and understanding of Target Heart Rate Range (THRR) Yes       Intervention Provide education and explanation of THRR including how the numbers were predicted and where they are located for reference       Expected Outcomes Short Term: Able to state/look up THRR;Short Term: Able to use daily as guideline for intensity in rehab;Long Term: Able to use THRR to govern intensity when exercising independently       Understanding of Exercise Prescription Yes       Intervention Provide education, explanation, and written materials on patient's individual exercise prescription       Expected Outcomes Short Term: Able to explain program exercise prescription;Long Term: Able to explain home exercise prescription to exercise independently                Exercise Goals Re-Evaluation :   Discharge Exercise Prescription (Final Exercise Prescription Changes):   Nutrition:  Target Goals: Understanding of nutrition guidelines, daily intake of sodium 1500mg , cholesterol 200mg , calories 30% from fat and 7% or less from saturated fats, daily to have 5 or more servings of fruits and vegetables.  Biometrics:  Pre Biometrics - 06/03/23 1334       Pre Biometrics   Grip Strength 13 kg              Nutrition Therapy Plan and Nutrition Goals:   Nutrition Assessments:  MEDIFICTS Score Key: ?70 Need to make dietary changes  40-70 Heart Healthy Diet ? 40 Therapeutic Level Cholesterol Diet   Picture Your Plate Scores: <81 Unhealthy dietary pattern with much room for improvement. 41-50 Dietary pattern unlikely to meet recommendations for good health and room for improvement. 51-60 More healthful dietary pattern, with some room for improvement.  >60 Healthy dietary pattern, although there may be  some specific behaviors that could be improved.    Nutrition Goals Re-Evaluation:   Nutrition Goals Discharge (Final Nutrition Goals Re-Evaluation):   Psychosocial: Target Goals: Acknowledge presence or absence of significant depression and/or stress, maximize coping skills, provide positive support system. Participant is able to verbalize types and ability to use techniques and skills needed for reducing stress and depression.  Initial Review & Psychosocial Screening:  Initial Psych Review & Screening - 06/03/23 1347       Initial Review   Current issues with Current Depression;History of Depression;Current Anxiety/Panic;Current Psychotropic Meds;Current  Stress Concerns    Source of Stress Concerns Family;Chronic Illness    Comments Pt states she is worried about her husband who is currently residing in a skilled nursing facility. She stated he suffered a frontal lobe injury causing dementia. She also expressed his health is not good as he has suffered 2 heart attacks and strokes. She is also worried about her only child, her daughter, who she states is living on a LVAD and "isn't doing well". [name] states her brother recently passed away from cardiac and kidney problems. She also worries about her cardiac health and is currently on the kidney transplant list.      Family Dynamics   Good Support System? Yes    Concerns Recent loss of significant other    Comments [name] states she has a small family but has supportive friends      Barriers   Psychosocial barriers to participate in program The patient should benefit from training in stress management and relaxation.;Psychosocial barriers identified (see note)      Screening Interventions   Interventions Encouraged to exercise;To provide support and resources with identified psychosocial needs;Provide feedback about the scores to participant   [name] stated she is currently on psychotropic meds and sees a therapist regularly. She  declines any needs at the time.   Expected Outcomes Short Term goal: Utilizing psychosocial counselor, staff and physician to assist with identification of specific Stressors or current issues interfering with healing process. Setting desired goal for each stressor or current issue identified.;Long Term Goal: Stressors or current issues are controlled or eliminated.;Short Term goal: Identification and review with participant of any Quality of Life or Depression concerns found by scoring the questionnaire.;Long Term goal: The participant improves quality of Life and PHQ9 Scores as seen by post scores and/or verbalization of changes             Quality of Life Scores:  Scores of 19 and below usually indicate a poorer quality of life in these areas.  A difference of  2-3 points is a clinically meaningful difference.  A difference of 2-3 points in the total score of the Quality of Life Index has been associated with significant improvement in overall quality of life, self-image, physical symptoms, and general health in studies assessing change in quality of life.  PHQ-9: Review Flowsheet       06/03/2023  Depression screen PHQ 2/9  Decreased Interest 2  Down, Depressed, Hopeless 2  PHQ - 2 Score 4  Altered sleeping 1  Tired, decreased energy 2  Change in appetite 3  Feeling bad or failure about yourself  1  Trouble concentrating 1  Moving slowly or fidgety/restless 3  Suicidal thoughts 0  PHQ-9 Score 15  Difficult doing work/chores Somewhat difficult    Details           Interpretation of Total Score  Total Score Depression Severity:  1-4 = Minimal depression, 5-9 = Mild depression, 10-14 = Moderate depression, 15-19 = Moderately severe depression, 20-27 = Severe depression   Psychosocial Evaluation and Intervention:  Psychosocial Evaluation - 06/03/23 1352       Psychosocial Evaluation & Interventions   Interventions Encouraged to exercise with the program and follow  exercise prescription;Relaxation education;Stress management education    Comments [name] stated she is currently on psychotropic meds and sees a therapist regularly. She declines any needs at the time.    Expected Outcomes For [name] to attend pulmonary rehab and to have a positive outlook and  good coping skills to manage her stress. For [name] to be compliant in taking her psychotropic meds and continue seeing her therapist regularly.    Continue Psychosocial Services  Follow up required by staff             Psychosocial Re-Evaluation:   Psychosocial Discharge (Final Psychosocial Re-Evaluation):   Education: Education Goals: Education classes will be provided on a weekly basis, covering required topics. Participant will state understanding/return demonstration of topics presented.  Learning Barriers/Preferences:  Learning Barriers/Preferences - 06/03/23 1542       Learning Barriers/Preferences   Learning Barriers None    Learning Preferences Group Instruction;Individual Instruction;Verbal Instruction;Written Material             Education Topics: Introduction to Pulmonary Rehab Group instruction provided by PowerPoint, verbal discussion, and written material to support subject matter. Instructor reviews what Pulmonary Rehab is, the purpose of the program, and how patients are referred.     Know Your Numbers Group instruction that is supported by a PowerPoint presentation. Instructor discusses importance of knowing and understanding resting, exercise, and post-exercise oxygen saturation, heart rate, and blood pressure. Oxygen saturation, heart rate, blood pressure, rating of perceived exertion, and dyspnea are reviewed along with a normal range for these values.    Exercise for the Pulmonary Patient Group instruction that is supported by a PowerPoint presentation. Instructor discusses benefits of exercise, core components of exercise, frequency, duration, and intensity of  an exercise routine, importance of utilizing pulse oximetry during exercise, safety while exercising, and options of places to exercise outside of rehab.    MET Level  Group instruction provided by PowerPoint, verbal discussion, and written material to support subject matter. Instructor reviews what METs are and how to increase METs.    Pulmonary Medications Verbally interactive group education provided by instructor with focus on inhaled medications and proper administration.   Anatomy and Physiology of the Respiratory System Group instruction provided by PowerPoint, verbal discussion, and written material to support subject matter. Instructor reviews respiratory cycle and anatomical components of the respiratory system and their functions. Instructor also reviews differences in obstructive and restrictive respiratory diseases with examples of each.    Oxygen Safety Group instruction provided by PowerPoint, verbal discussion, and written material to support subject matter. There is an overview of "What is Oxygen" and "Why do we need it".  Instructor also reviews how to create a safe environment for oxygen use, the importance of using oxygen as prescribed, and the risks of noncompliance. There is a brief discussion on traveling with oxygen and resources the patient may utilize.   Oxygen Use Group instruction provided by PowerPoint, verbal discussion, and written material to discuss how supplemental oxygen is prescribed and different types of oxygen supply systems. Resources for more information are provided.    Breathing Techniques Group instruction that is supported by demonstration and informational handouts. Instructor discusses the benefits of pursed lip and diaphragmatic breathing and detailed demonstration on how to perform both.     Risk Factor Reduction Group instruction that is supported by a PowerPoint presentation. Instructor discusses the definition of a risk factor, different  risk factors for pulmonary disease, and how the heart and lungs work together.   MD Day A group question and answer session with a medical doctor that allows participants to ask questions that relate to their pulmonary disease state.   Nutrition for the Pulmonary Patient Group instruction provided by PowerPoint slides, verbal discussion, and written materials to support subject matter.  The instructor gives an explanation and review of healthy diet recommendations, which includes a discussion on weight management, recommendations for fruit and vegetable consumption, as well as protein, fluid, caffeine, fiber, sodium, sugar, and alcohol. Tips for eating when patients are short of breath are discussed.    Other Education Group or individual verbal, written, or video instructions that support the educational goals of the pulmonary rehab program.    Knowledge Questionnaire Score:  Knowledge Questionnaire Score - 06/03/23 1530       Knowledge Questionnaire Score   Pre Score 16/18             Core Components/Risk Factors/Patient Goals at Admission:  Personal Goals and Risk Factors at Admission - 06/03/23 1343       Core Components/Risk Factors/Patient Goals on Admission    Weight Management Yes;Weight Maintenance    Admit Weight 118 lb 2.7 oz (53.6 kg)    Improve shortness of breath with ADL's Yes    Intervention Provide education, individualized exercise plan and daily activity instruction to help decrease symptoms of SOB with activities of daily living.    Expected Outcomes Short Term: Improve cardiorespiratory fitness to achieve a reduction of symptoms when performing ADLs;Long Term: Be able to perform more ADLs without symptoms or delay the onset of symptoms    Increase knowledge of respiratory medications and ability to use respiratory devices properly  Yes    Intervention Provide education and demonstration as needed of appropriate use of medications, inhalers, and oxygen  therapy.    Expected Outcomes Short Term: Achieves understanding of medications use. Understands that oxygen is a medication prescribed by physician. Demonstrates appropriate use of inhaler and oxygen therapy.;Long Term: Maintain appropriate use of medications, inhalers, and oxygen therapy.    Stress Yes    Intervention Offer individual and/or small group education and counseling on adjustment to heart disease, stress management and health-related lifestyle change. Teach and support self-help strategies.;Refer participants experiencing significant psychosocial distress to appropriate mental health specialists for further evaluation and treatment. When possible, include family members and significant others in education/counseling sessions.    Expected Outcomes Short Term: Participant demonstrates changes in health-related behavior, relaxation and other stress management skills, ability to obtain effective social support, and compliance with psychotropic medications if prescribed.;Long Term: Emotional wellbeing is indicated by absence of clinically significant psychosocial distress or social isolation.             Core Components/Risk Factors/Patient Goals Review:    Core Components/Risk Factors/Patient Goals at Discharge (Final Review):    ITP Comments:   Comments: Dr. Mechele Collin is Medical Director for Pulmonary Rehab at [facility].

## 2023-06-03 NOTE — Progress Notes (Signed)
[name] 72 y.o. female  Pulmonary Rehab Orientation Note  This patient who was referred to Pulmonary Rehab by Dr. Nanetta Batty from cardiology with the diagnosis of chronic diastolic heart failure arrived today in Cardiac and Pulmonary Rehab. She  arrived ambulatory with normal gait. She  does not carry portable oxygen. Per patient, [name] never uses oxygen. Color good, skin warm and dry. Patient is oriented to time and place. Patient's medical history, psychosocial health, and medications reviewed.   Psychosocial assessment reveals patient lives with family. [name] is currently retired. Patient hobbies include spending time with others and reading. Patient reports her stress level is high. Areas of stress/anxiety include health and family . Patient does exhibit signs of depression. Signs of depression include crying spells and sadness and fatigue. PHQ2/9 score 4/15. [name] shows fair  coping skills with positive outlook on life. Offered emotional support and reassurance. Will continue to monitor and evaluate progress toward psychosocial goal(s) of decreased stress.  Physical assessment reveals patient is alert and oriented x 4. Heart rate is normal, breath sounds diminished to auscultation, no wheezes, rales, or rhonchi. Pt denies chronic cough. Bowel sounds present x4 quads.  Pt denies abdominal discomfort, nausea, vomiting, diarrhea or constipation. Grip strength equal, strong. Distal pulses +2; no swelling to lower extremities.   Tomorrow reports she does take medications as prescribed. Patient states she follows a vegetarian diet. The patient reports no specific efforts to gain or lose weight. Pt's weight will be monitored closely.   Demonstration and practice of PLB using pulse oximeter. [name] able to return demonstration satisfactorily. Safety and hand hygiene in the exercise area reviewed with patient. [name] voices understanding of the information reviewed. Department expectations  discussed with patient and achievable goals were set. The patient shows enthusiasm about attending the program and we look forward to working with [name]. [name] completed a 6 min walk test today and is scheduled to begin exercise on 06/09/223 at 1315.   1310-1415 Essie Hart, RN, BSN

## 2023-06-06 ENCOUNTER — Other Ambulatory Visit: Payer: Self-pay | Admitting: Cardiovascular Disease

## 2023-06-09 ENCOUNTER — Encounter (HOSPITAL_COMMUNITY)
Admission: RE | Admit: 2023-06-09 | Discharge: 2023-06-09 | Disposition: A | Discharge status: Home / Self Care | Payer: Medicare HMO | Source: Ambulatory Visit | Attending: Cardiovascular Disease | Admitting: Cardiovascular Disease

## 2023-06-09 ENCOUNTER — Encounter (HOSPITAL_COMMUNITY)
Admission: RE | Admit: 2023-06-09 | Discharge: 2023-06-09 | Disposition: A | Discharge status: Home / Self Care | Payer: Medicare HMO | Source: Ambulatory Visit

## 2023-06-09 DIAGNOSIS — I5032 Chronic diastolic (congestive) heart failure: Secondary | ICD-10-CM | POA: Insufficient documentation

## 2023-06-09 LAB — GLUCOSE, CAPILLARY
Glucose-Capillary: 105 mg/dL — ABNORMAL HIGH (ref 70–99)
Glucose-Capillary: 122 mg/dL — ABNORMAL HIGH (ref 70–99)

## 2023-06-09 NOTE — Progress Notes (Signed)
Remote ICD transmission.   

## 2023-06-09 NOTE — Progress Notes (Signed)
Daily Session Note  Patient Details  Name: [name] MRN: [id number] Date of Birth: [birth year] Referring Provider:   Doristine Devoid Pulmonary Rehab Walk Test from 06/03/2023 in [facility]  Referring Provider Allyson Sabal       Encounter Date: 06/09/2023  Check In:   Capillary Blood Glucose: Results for orders placed or performed during the hospital encounter of 06/03/23 (from the past 24 hour(s))  Glucose, capillary     Status: Abnormal   Collection Time: 06/09/23  1:34 PM  Result Value Ref Range   Glucose-Capillary 105 (H) 70 - 99 mg/dL  Glucose, capillary     Status: Abnormal   Collection Time: 06/09/23  2:43 PM  Result Value Ref Range   Glucose-Capillary 122 (H) 70 - 99 mg/dL      Social History   Tobacco Use  Smoking Status Never  Smokeless Tobacco Never    Goals Met:  Exercise tolerated well No report of concerns or symptoms today Strength training completed today  Goals Unmet:  Not Applicable  Comments: Service time is from 1312 to 1459    Dr. Mechele Collin is Medical Director for Pulmonary Rehab at [facility]

## 2023-06-14 ENCOUNTER — Encounter (HOSPITAL_COMMUNITY)
Admission: RE | Admit: 2023-06-14 | Discharge: 2023-06-14 | Disposition: A | Discharge status: Home / Self Care | Payer: Medicare HMO | Source: Ambulatory Visit | Attending: Cardiovascular Disease | Admitting: Cardiovascular Disease

## 2023-06-14 ENCOUNTER — Encounter (HOSPITAL_COMMUNITY): Payer: Medicare HMO

## 2023-06-14 VITALS — Wt 117.5 lb

## 2023-06-14 DIAGNOSIS — I5032 Chronic diastolic (congestive) heart failure: Secondary | ICD-10-CM

## 2023-06-14 LAB — GLUCOSE, CAPILLARY
Glucose-Capillary: 157 mg/dL — ABNORMAL HIGH (ref 70–99)
Glucose-Capillary: 140 mg/dL — ABNORMAL HIGH (ref 70–99)

## 2023-06-14 NOTE — Progress Notes (Signed)
Daily Session Note  Patient Details  Name: [name] MRN: [id number] Date of Birth: 03-Apr-1951 Referring Provider:   Doristine Devoid Pulmonary Rehab Walk Test from 06/03/2023 in [facility]  Referring Provider Allyson Sabal       Encounter Date: 06/14/2023  Check In:  Session Check In - 06/14/23 1353       Check-In   Supervising physician immediately available to respond to emergencies [facility] MD immediately available    Physician(s) Whitney Muse born, NP    Location [facility]    Staff Present Raford Pitcher, MS, ACSM-CEP, Exercise Physiologist;Randi Dionisio Paschal, ACSM-CEP, Exercise Physiologist;Samantha Belarus, RD, Dutch Gray, RN, BSN; , RT;Johnny Hale Bogus, MS, Exercise Physiologist    Virtual Visit No    Medication changes reported     No    Fall or balance concerns reported    No    Tobacco Cessation No Change    Warm-up and Cool-down Performed as group-led instruction    Resistance Training Performed Yes    VAD Patient? No    PAD/SET Patient? No      Pain Assessment   Currently in Pain? No/denies    Multiple Pain Sites No             Capillary Blood Glucose: Results for orders placed or performed during the hospital encounter of 06/09/23 (from the past 24 hour(s))  Glucose, capillary     Status: Abnormal   Collection Time: 06/14/23  1:28 PM  Result Value Ref Range   Glucose-Capillary 157 (H) 70 - 99 mg/dL  Glucose, capillary     Status: Abnormal   Collection Time: 06/14/23  2:38 PM  Result Value Ref Range   Glucose-Capillary 140 (H) 70 - 99 mg/dL     Exercise Prescription Changes - 06/14/23 1500       Response to Exercise   Blood Pressure (Admit) 110/66    Blood Pressure (Exercise) 118/60    Blood Pressure (Exit) 108/58    Heart Rate (Admit) 77 bpm    Heart Rate (Exercise) 91 bpm    Heart Rate (Exit) 78 bpm    Oxygen Saturation (Admit) 98 %    Oxygen Saturation  (Exercise) 98 %    Oxygen Saturation (Exit) 100 %    Rating of Perceived Exertion (Exercise) 12    Perceived Dyspnea (Exercise) 1    Duration Progress to 30 minutes of  aerobic without signs/symptoms of physical distress    Intensity THRR unchanged      Progression   Progression Continue to progress workloads to maintain intensity without signs/symptoms of physical distress.      Resistance Training   Training Prescription Yes    Weight red bands    Reps 10-15    Time 10 Minutes      Treadmill   MPH 2    Grade 1    Minutes 15    METs 2.81      Bike   Level 1    Minutes 15    METs 2.8             Social History   Tobacco Use  Smoking Status Never  Smokeless Tobacco Never    Goals Met:  Proper associated with RPD/PD & O2 Sat Independence with exercise equipment Exercise tolerated well No report of concerns or symptoms today Strength training completed today  Goals Unmet:  Not Applicable  Comments: Service time is from 1328 to  1437.    Dr. Mechele Collin is Medical Director for Pulmonary Rehab at [facility].

## 2023-06-15 NOTE — Progress Notes (Signed)
Pulmonary Individual Treatment Plan  Patient Details  Name: [name] MRN: [id number] Date of Birth: 11-06-1951 Referring Provider:   Doristine Devoid Pulmonary Rehab Walk Test from 06/03/2023 in [facility]  Referring Provider [name]       Initial Encounter Date:  Flowsheet Row Pulmonary Rehab Walk Test from 06/03/2023 in [facility]  Date 06/03/23       Visit Diagnosis: Heart failure, diastolic, chronic (HCC)  Patient's Home Medications on Admission:   Current Outpatient Medications:    albuterol (PROVENTIL HFA;VENTOLIN HFA) 108 (90 BASE) MCG/ACT inhaler, Inhale 2 puffs into the lungs every 4 (four) hours as needed for wheezing or shortness of breath., Disp: , Rfl:    Alcohol Swabs (B-D SINGLE USE SWABS REGULAR) PADS, , Disp: , Rfl:    ALPRAZolam (XANAX) 1 MG tablet, Take 1 mg by mouth 3 (three) times daily. , Disp: , Rfl: 2   Artificial Tear GEL, Place 2 drops into both eyes daily as needed (dry eyes). , Disp: , Rfl:    aspirin 325 MG EC tablet, Take 325 mg by mouth daily., Disp: , Rfl:    benzonatate (TESSALON) 100 MG capsule, Take 1 capsule (100 mg total) by mouth 3 (three) times daily as needed for cough. (Patient not taking: Reported on 04/29/2023), Disp: 30 capsule, Rfl: 0   BIOTIN PO, Take 1 tablet by mouth daily., Disp: , Rfl:    cetirizine (ZYRTEC) 10 MG tablet, Take 10 mg by mouth daily., Disp: , Rfl:    Cholecalciferol (VITAMIN D-3) 1000 UNITS CAPS, Take 1,000 Units by mouth daily. , Disp: , Rfl:    clopidogrel (PLAVIX) 75 MG tablet, TAKE 1 TABLET EVERY DAY, Disp: 90 tablet, Rfl: 3   cyclobenzaprine (FLEXERIL) 5 MG tablet, Take 1 tablet (5 mg total) by mouth at bedtime as needed for muscle spasms. (Patient not taking: Reported on 04/29/2023), Disp: 30 tablet, Rfl: 0   Docusate Sodium (DSS) 250 MG CAPS, Take 1 capsule by mouth as needed., Disp: , Rfl:     doxazosin (CARDURA) 4 MG tablet, Take 4 mg by mouth at bedtime. , Disp: , Rfl:    DROPLET PEN NEEDLES 32G X 4 MM MISC, , Disp: , Rfl:    EPINEPHrine 0.3 mg/0.3 mL IJ SOAJ injection, Inject 0.3 mg into the muscle as needed for anaphylaxis. , Disp: , Rfl: 3   ezetimibe (ZETIA) 10 MG tablet, TAKE 1 TABLET EVERY DAY, Disp: 90 tablet, Rfl: 3   Flaxseed, Linseed, (FLAXSEED OIL) 1200 MG CAPS, Take 1,200 mg by mouth daily., Disp: , Rfl:    fluticasone (FLONASE) 50 MCG/ACT nasal spray, Place 2 sprays into both nostrils daily as needed for allergies or rhinitis., Disp: , Rfl:    furosemide (LASIX) 80 MG tablet, TAKE 1 TABLET TWICE A DAY. MAY TAKE AN ADDITIONAL 80MG  (1 TABLET) AS NEEDED FOR EDEMA, Disp: 270 tablet, Rfl: 3   hydrALAZINE (APRESOLINE) 25 MG tablet, TAKE 1 TABLET TWICE DAILY, Disp: 180 tablet, Rfl: 3   isosorbide mononitrate (IMDUR) 60 MG 24 hr tablet, TAKE 1 TABLET EVERY DAY, Disp: 90 tablet, Rfl: 3   Ketotifen Fumarate (ALAWAY OP), Place 1 drop into both eyes daily as needed (allergies). , Disp: , Rfl:    LANTUS SOLOSTAR 100 UNIT/ML Solostar Pen, Inject 6 Units into the skin at bedtime.  (Patient not taking: Reported on 04/29/2023), Disp: , Rfl:  metoprolol succinate (TOPROL-XL) 100 MG 24 hr tablet, TAKE 2 TABLETS EVERY DAY WITH OR IMMEDIATELY FOLLOWING A MEAL, Disp: 180 tablet, Rfl: 3   Multiple Vitamin (MULTIVITAMIN WITH MINERALS) TABS, Take 1 tablet by mouth daily., Disp: , Rfl:    nitroGLYCERIN (NITROSTAT) 0.4 MG SL tablet, Place 1 tablet under the tongue every 5 minutes as needed for chest pain, max 3 doses, go to er if no relief, Disp: 75 tablet, Rfl: 2   olmesartan (BENICAR) 40 MG tablet, TAKE 1 TABLET EVERY DAY, Disp: 90 tablet, Rfl: 3   ondansetron (ZOFRAN) 8 MG tablet, Take 8 mg by mouth every 8 (eight) hours as needed for nausea or vomiting., Disp: , Rfl:    OZEMPIC, 0.25 OR 0.5 MG/DOSE, 2 MG/1.5ML SOPN, Inject 0.25 mg into the skin once a week. Sunday, Disp: , Rfl:     pantoprazole (PROTONIX) 40 MG tablet, TAKE 1 TABLET BY MOUTH  DAILY, Disp: 90 tablet, Rfl: 3   potassium chloride (KLOR-CON M) 10 MEQ tablet, TAKE 2 TABLETS TWICE A DAY (KEEP MD APPOINTMENT FOR REFILLS), Disp: 360 tablet, Rfl: 3   simvastatin (ZOCOR) 40 MG tablet, TAKE 1 TABLET (40 MG TOTAL) BY MOUTH AT BEDTIME., Disp: 90 tablet, Rfl: 3   sodium chloride (MURO 128) 5 % ophthalmic ointment, Place 1 drop into both eyes at bedtime. For dry eyes, Disp: , Rfl:    traMADol (ULTRAM) 50 MG tablet, Take 100 mg by mouth every 6 (six) hours as needed (MIGRAINES)., Disp: , Rfl:    TRUE METRIX BLOOD GLUCOSE TEST test strip, , Disp: , Rfl:    TRUEplus Lancets 33G MISC, , Disp: , Rfl:    venlafaxine (EFFEXOR) 100 MG tablet, Take 100-200 mg by mouth See admin instructions. TAKE 200 mg by mouth in the morning and take 100 mg at noon, Disp: , Rfl:    vitamin A 7500 UNIT capsule, Take 2,400 Units by mouth daily., Disp: , Rfl:   Past Medical History: Past Medical History:  Diagnosis Date   AICD (automatic cardioverter/defibrillator) present    [employer]- Dr. Rosette Reveal follows   Anginal pain (HCC)    Anxiety    Asthma    Back pain    "pinched nerve-lower back" - Dr. Ethelene Hal follows.   Biventricular implantable cardioverter-defibrillator in situ 06/18/2011   Cerebral infarction (HCC) 11/11/2012   Cervical dysplasia    Chronic diastolic heart failure (HCC) 03/22/2016   Chronic kidney disease    Dr. Allena Katz follows.   Chronic renal insufficiency, stage III (moderate) (HCC) 11/11/2012   Complication of anesthesia    "I wake up during surgeries" (02/14/2013)   Coronary artery disease involving coronary bypass graft of native heart with angina pectoris (HCC) 06/18/2011   Depression    DM (diabetes mellitus) (HCC) 11/11/2012   Fibroid    Function kidney decreased    GERD (gastroesophageal reflux disease)    Hemiplegia, unspecified, affecting nondominant side 11/11/2012   Hepatitis    Hepatitis A -college yrs"water source  exposure"   Hiatal hernia    History of shingles    2-3 yrs ago last out break "around waist"   History of stomach ulcers    Hyperlipidemia 07/30/2016   Hypertension    ICD (implantable cardiac defibrillator) in place    Iron deficiency anemia    Ischemic cardiomyopathy    status post biventricular ICD placed by DR Edumunds who used to see Dr Elsie Lincoln here to establish  cardiovascular care.   Migraines    MVP (  mitral valve prolapse)    Antibiotics not required for procedures   Myocardial infarction (HCC)    "I've had 2; the others they were able to catch before completing" (02/14/2013)   Pacemaker    Paroxysmal SVT (supraventricular tachycardia) 06/18/2011   Pneumonia 1950's & 1985   Shortness of breath    "lying down flat; at times w/exertion" (02/14/2013). 10-06-15 exertion only..   Stroke [facility])    "2 confirmed; 9 TIA's; results in dragging LLE; numbness in tip of tongue" (02/14/2013),10-06-15 right hand tends to be weaker when tired.   TIA (transient ischemic attack) 11/11/2012   Type II diabetes mellitus (HCC)     Tobacco Use: Social History   Tobacco Use  Smoking Status Never  Smokeless Tobacco Never    Labs: Review Flowsheet  More data exists      Latest Ref Rng & Units 01/03/2015 01/01/2016 01/02/2016 08/15/2018 04/06/2019  Labs for ITP Cardiac and Pulmonary Rehab  Cholestrol 100 - 199 mg/dL 409  - 811  914  782   LDL (calc) 0 - 99 mg/dL 70  - 66  72  92   HDL-C >39 mg/dL 54  - 41  49  61   Trlycerides 0 - 149 mg/dL 956  - 213  086  578   Hemoglobin A1c 4.8 - 5.6 % - - 6.9  - 6.5   TCO2 0 - 100 mmol/L - 25  - - -    Details            Capillary Blood Glucose: Lab Results  Component Value Date   GLUCAP 140 (H) 06/14/2023   GLUCAP 157 (H) 06/14/2023   GLUCAP 122 (H) 06/09/2023   GLUCAP 105 (H) 06/09/2023   GLUCAP 134 (H) 08/05/2018    POCT Glucose     Row Name 06/03/23 1337             POCT Blood Glucose   Pre-Exercise 128 mg/dL                 Pulmonary Assessment Scores:  Pulmonary Assessment Scores     Row Name 06/03/23 1358         ADL UCSD   ADL Phase Entry     SOB Score total 47       CAT Score   CAT Score 18       mMRC Score   mMRC Score 3             UCSD: Self-administered rating of dyspnea associated with activities of daily living (ADLs) 6-point scale (0 = "not at all" to 5 = "maximal or unable to do because of breathlessness")  Scoring Scores range from 0 to 120.  Minimally important difference is 5 units  CAT: CAT can identify the health impairment of COPD patients and is better correlated with disease progression.  CAT has a scoring range of zero to 40. The CAT score is classified into four groups of low (less than 10), medium (10 - 20), high (21-30) and very high (31-40) based on the impact level of disease on health status. A CAT score over 10 suggests significant symptoms.  A worsening CAT score could be explained by an exacerbation, poor medication adherence, poor inhaler technique, or progression of COPD or comorbid conditions.  CAT MCID is 2 points  mMRC: mMRC (Modified Medical Research Council) Dyspnea Scale is used to assess the degree of baseline functional disability in patients of respiratory disease due to dyspnea. No minimal  important difference is established. A decrease in score of 1 point or greater is considered a positive change.   Pulmonary Function Assessment:  Pulmonary Function Assessment - 06/03/23 1340       Breath   Bilateral Breath Sounds Clear    Shortness of Breath Yes             Exercise Target Goals: Exercise Program Goal: Individual exercise prescription set using results from initial 6 min walk test and THRR while considering  patient's activity barriers and safety.   Exercise Prescription Goal: Initial exercise prescription builds to 30-45 minutes a day of aerobic activity, 2-3 days per week.  Home exercise guidelines will be given to patient during  program as part of exercise prescription that the participant will acknowledge.  Activity Barriers & Risk Stratification:  Activity Barriers & Cardiac Risk Stratification - 06/03/23 1339       Activity Barriers & Cardiac Risk Stratification   Activity Barriers Deconditioning;Muscular Weakness;Shortness of Breath;Balance Concerns             6 Minute Walk:  6 Minute Walk     Row Name 06/03/23 1545         6 Minute Walk   Phase Initial     Distance 1172 feet     Walk Time 6 minutes     # of Rest Breaks 0     MPH 2.22     METS 2.87     RPE 13     Perceived Dyspnea  1.5     VO2 Peak 10.03     Symptoms No     Resting HR 80 bpm     Resting BP 122/64     Resting Oxygen Saturation  99 %     Exercise Oxygen Saturation  during 6 min walk 94 %     Max Ex. HR 93 bpm     Max Ex. BP 118/70     2 Minute Post BP 114/68       Interval HR   1 Minute HR 83     2 Minute HR 83     3 Minute HR 80     4 Minute HR 87     5 Minute HR 92     6 Minute HR 93     2 Minute Post HR 73     Interval Heart Rate? Yes       Interval Oxygen   Interval Oxygen? Yes     Baseline Oxygen Saturation % 99 %     1 Minute Oxygen Saturation % 97 %     1 Minute Liters of Oxygen 0 L     2 Minute Oxygen Saturation % 94 %     2 Minute Liters of Oxygen 0 L     3 Minute Oxygen Saturation % 100 %     3 Minute Liters of Oxygen 0 L     4 Minute Oxygen Saturation % 100 %     4 Minute Liters of Oxygen 0 L     5 Minute Oxygen Saturation % 99 %     5 Minute Liters of Oxygen 0 L     6 Minute Oxygen Saturation % 100 %     6 Minute Liters of Oxygen 0 L     2 Minute Post Oxygen Saturation % 100 %     2 Minute Post Liters of Oxygen 0 L  Oxygen Initial Assessment:  Oxygen Initial Assessment - 06/03/23 1340       Home Oxygen   Home Oxygen Device None    Sleep Oxygen Prescription None    Home Exercise Oxygen Prescription None    Home Resting Oxygen Prescription None      Initial 6 min  Walk   Oxygen Used None      Program Oxygen Prescription   Program Oxygen Prescription None      Intervention   Short Term Goals To learn and understand importance of maintaining oxygen saturations>88%;To learn and demonstrate proper use of respiratory medications;To learn and understand importance of monitoring SPO2 with pulse oximeter and demonstrate accurate use of the pulse oximeter.;To learn and demonstrate proper pursed lip breathing techniques or other breathing techniques.     Long  Term Goals Maintenance of O2 saturations>88%;Compliance with respiratory medication;Verbalizes importance of monitoring SPO2 with pulse oximeter and return demonstration;Exhibits proper breathing techniques, such as pursed lip breathing or other method taught during program session;Demonstrates proper use of MDI's             Oxygen Re-Evaluation:  Oxygen Re-Evaluation     Row Name 06/09/23 0905             Program Oxygen Prescription   Program Oxygen Prescription None         Home Oxygen   Home Oxygen Device None       Sleep Oxygen Prescription None       Home Exercise Oxygen Prescription None       Home Resting Oxygen Prescription None         Goals/Expected Outcomes   Short Term Goals To learn and understand importance of maintaining oxygen saturations>88%;To learn and demonstrate proper use of respiratory medications;To learn and understand importance of monitoring SPO2 with pulse oximeter and demonstrate accurate use of the pulse oximeter.;To learn and demonstrate proper pursed lip breathing techniques or other breathing techniques.        Long  Term Goals Maintenance of O2 saturations>88%;Compliance with respiratory medication;Verbalizes importance of monitoring SPO2 with pulse oximeter and return demonstration;Exhibits proper breathing techniques, such as pursed lip breathing or other method taught during program session;Demonstrates proper use of MDI's       Goals/Expected Outcomes  Compliance and understanding of oxygen saturation monitoring and breathing techniques to decrease shortness of breath                Oxygen Discharge (Final Oxygen Re-Evaluation):  Oxygen Re-Evaluation - 06/09/23 0905       Program Oxygen Prescription   Program Oxygen Prescription None      Home Oxygen   Home Oxygen Device None    Sleep Oxygen Prescription None    Home Exercise Oxygen Prescription None    Home Resting Oxygen Prescription None      Goals/Expected Outcomes   Short Term Goals To learn and understand importance of maintaining oxygen saturations>88%;To learn and demonstrate proper use of respiratory medications;To learn and understand importance of monitoring SPO2 with pulse oximeter and demonstrate accurate use of the pulse oximeter.;To learn and demonstrate proper pursed lip breathing techniques or other breathing techniques.     Long  Term Goals Maintenance of O2 saturations>88%;Compliance with respiratory medication;Verbalizes importance of monitoring SPO2 with pulse oximeter and return demonstration;Exhibits proper breathing techniques, such as pursed lip breathing or other method taught during program session;Demonstrates proper use of MDI's    Goals/Expected Outcomes Compliance and understanding of oxygen saturation monitoring and breathing techniques  to decrease shortness of breath             Initial Exercise Prescription:  Initial Exercise Prescription - 06/03/23 1400       Date of Initial Exercise RX and Referring Provider   Date 06/03/23    Referring Provider [name]    Expected Discharge Date 09/01/23      Treadmill   MPH 2    Grade 0    Minutes 15    METs 2.53      Bike   Level 1    Watts 20    Minutes 15      Prescription Details   Frequency (times per week) 2    Duration Progress to 30 minutes of continuous aerobic without signs/symptoms of physical distress      Intensity   THRR 40-80% of Max Heartrate 60-121    Ratings of Perceived  Exertion 11-13    Perceived Dyspnea 0-4      Progression   Progression Continue to progress workloads to maintain intensity without signs/symptoms of physical distress.      Resistance Training   Training Prescription Yes    Weight red bands    Reps 10-15             Perform Capillary Blood Glucose checks as needed.  Exercise Prescription Changes:   Exercise Prescription Changes     Row Name 06/14/23 1500             Response to Exercise   Blood Pressure (Admit) 110/66       Blood Pressure (Exercise) 118/60       Blood Pressure (Exit) 108/58       Heart Rate (Admit) 77 bpm       Heart Rate (Exercise) 91 bpm       Heart Rate (Exit) 78 bpm       Oxygen Saturation (Admit) 98 %       Oxygen Saturation (Exercise) 98 %       Oxygen Saturation (Exit) 100 %       Rating of Perceived Exertion (Exercise) 12       Perceived Dyspnea (Exercise) 1       Duration Progress to 30 minutes of  aerobic without signs/symptoms of physical distress       Intensity THRR unchanged         Progression   Progression Continue to progress workloads to maintain intensity without signs/symptoms of physical distress.         Resistance Training   Training Prescription Yes       Weight red bands       Reps 10-15       Time 10 Minutes         Treadmill   MPH 2       Grade 1       Minutes 15       METs 2.81         Bike   Level 1       Minutes 15       METs 2.8                Exercise Comments:   Exercise Comments     Row Name 06/09/23 1549           Exercise Comments Pt completed first day of group exercise. Exercised on scifit bike for 15 min, level 1, 2.8 METs. She then walked the track for 15 min, speed 2.0, METs  2.9. Tolerated well. Performed warm up and cooldown with verbal cues. She c/o knee pain with squats.                Exercise Goals and Review:   Exercise Goals     Row Name 06/03/23 1339 06/09/23 0904           Exercise Goals   Increase  Physical Activity Yes Yes      Intervention Provide advice, education, support and counseling about physical activity/exercise needs.;Develop an individualized exercise prescription for aerobic and resistive training based on initial evaluation findings, risk stratification, comorbidities and participant's personal goals. Provide advice, education, support and counseling about physical activity/exercise needs.;Develop an individualized exercise prescription for aerobic and resistive training based on initial evaluation findings, risk stratification, comorbidities and participant's personal goals.      Expected Outcomes Short Term: Attend rehab on a regular basis to increase amount of physical activity.;Long Term: Exercising regularly at least 3-5 days a week.;Long Term: Add in home exercise to make exercise part of routine and to increase amount of physical activity. Short Term: Attend rehab on a regular basis to increase amount of physical activity.;Long Term: Exercising regularly at least 3-5 days a week.;Long Term: Add in home exercise to make exercise part of routine and to increase amount of physical activity.      Increase Strength and Stamina Yes Yes      Intervention Provide advice, education, support and counseling about physical activity/exercise needs.;Develop an individualized exercise prescription for aerobic and resistive training based on initial evaluation findings, risk stratification, comorbidities and participant's personal goals. Provide advice, education, support and counseling about physical activity/exercise needs.;Develop an individualized exercise prescription for aerobic and resistive training based on initial evaluation findings, risk stratification, comorbidities and participant's personal goals.      Expected Outcomes Short Term: Increase workloads from initial exercise prescription for resistance, speed, and METs.;Short Term: Perform resistance training exercises routinely during  rehab and add in resistance training at home;Long Term: Improve cardiorespiratory fitness, muscular endurance and strength as measured by increased METs and functional capacity ( ) Short Term: Increase workloads from initial exercise prescription for resistance, speed, and METs.;Short Term: Perform resistance training exercises routinely during rehab and add in resistance training at home;Long Term: Improve cardiorespiratory fitness, muscular endurance and strength as measured by increased METs and functional capacity ( )      Able to understand and use rate of perceived exertion (RPE) scale Yes Yes      Intervention Provide education and explanation on how to use RPE scale Provide education and explanation on how to use RPE scale      Expected Outcomes Short Term: Able to use RPE daily in rehab to express subjective intensity level;Long Term:  Able to use RPE to guide intensity level when exercising independently Short Term: Able to use RPE daily in rehab to express subjective intensity level;Long Term:  Able to use RPE to guide intensity level when exercising independently      Able to understand and use Dyspnea scale Yes Yes      Intervention Provide education and explanation on how to use Dyspnea scale Provide education and explanation on how to use Dyspnea scale      Expected Outcomes Short Term: Able to use Dyspnea scale daily in rehab to express subjective sense of shortness of breath during exertion;Long Term: Able to use Dyspnea scale to guide intensity level when exercising independently Short Term: Able to use Dyspnea scale daily in rehab to  express subjective sense of shortness of breath during exertion;Long Term: Able to use Dyspnea scale to guide intensity level when exercising independently      Knowledge and understanding of Target Heart Rate Range (THRR) Yes Yes      Intervention Provide education and explanation of THRR including how the numbers were predicted and where they are  located for reference Provide education and explanation of THRR including how the numbers were predicted and where they are located for reference      Expected Outcomes Short Term: Able to state/look up THRR;Short Term: Able to use daily as guideline for intensity in rehab;Long Term: Able to use THRR to govern intensity when exercising independently Short Term: Able to state/look up THRR;Short Term: Able to use daily as guideline for intensity in rehab;Long Term: Able to use THRR to govern intensity when exercising independently      Understanding of Exercise Prescription Yes Yes      Intervention Provide education, explanation, and written materials on patient's individual exercise prescription Provide education, explanation, and written materials on patient's individual exercise prescription      Expected Outcomes Short Term: Able to explain program exercise prescription;Long Term: Able to explain home exercise prescription to exercise independently Short Term: Able to explain program exercise prescription;Long Term: Able to explain home exercise prescription to exercise independently               Exercise Goals Re-Evaluation :  Exercise Goals Re-Evaluation     Row Name 06/09/23 0904             Exercise Goal Re-Evaluation   Exercise Goals Review Increase Physical Activity;Able to understand and use Dyspnea scale;Understanding of Exercise Prescription;Increase Strength and Stamina;Knowledge and understanding of Target Heart Rate Range (THRR);Able to understand and use rate of perceived exertion (RPE) scale       Comments Pt to begin exercise 8/1. Will progress as tolerated       Expected Outcomes Through exercise at rehab and home, the patient will decrease shortness of breath with daily activities and feel confident in carrying out an exercise regimen at home                Discharge Exercise Prescription (Final Exercise Prescription Changes):  Exercise Prescription Changes -  06/14/23 1500       Response to Exercise   Blood Pressure (Admit) 110/66    Blood Pressure (Exercise) 118/60    Blood Pressure (Exit) 108/58    Heart Rate (Admit) 77 bpm    Heart Rate (Exercise) 91 bpm    Heart Rate (Exit) 78 bpm    Oxygen Saturation (Admit) 98 %    Oxygen Saturation (Exercise) 98 %    Oxygen Saturation (Exit) 100 %    Rating of Perceived Exertion (Exercise) 12    Perceived Dyspnea (Exercise) 1    Duration Progress to 30 minutes of  aerobic without signs/symptoms of physical distress    Intensity THRR unchanged      Progression   Progression Continue to progress workloads to maintain intensity without signs/symptoms of physical distress.      Resistance Training   Training Prescription Yes    Weight red bands    Reps 10-15    Time 10 Minutes      Treadmill   MPH 2    Grade 1    Minutes 15    METs 2.81      Bike   Level 1    Minutes 15  METs 2.8             Nutrition:  Target Goals: Understanding of nutrition guidelines, daily intake of sodium 1500mg , cholesterol 200mg , calories 30% from fat and 7% or less from saturated fats, daily to have 5 or more servings of fruits and vegetables.  Biometrics:  Pre Biometrics - 06/03/23 1334       Pre Biometrics   Grip Strength 13 kg              Nutrition Therapy Plan and Nutrition Goals:  Nutrition Therapy & Goals - 06/09/23 1431       Nutrition Therapy   Diet Renal Diet      Personal Nutrition Goals   Nutrition Goal Patient to identify strategies for weight maintenance/weight gain of 0.5-2.0# per week.    Comments [name] reports following a vegetarian diet for >30 years; her primary protein sources are beans, dairy, etc. She is mindful of sodium. She is on ozempic for blood sugar control and recently stopped lantus; her A1c is 5.9. She does report blood sugar lows 2-3x/week in the middle of the night. She continues to follow-up with [facility] for kidney transplant. She is motivated to  maintain her weight. [name] will continue to benefit from participation in pulmonary rehab for nutrition and exercise support.      Intervention Plan   Intervention Prescribe, educate and counsel regarding individualized specific dietary modifications aiming towards targeted core components such as weight, hypertension, lipid management, diabetes, heart failure and other comorbidities.;Nutrition handout(s) given to patient.    Expected Outcomes Short Term Goal: Understand basic principles of dietary content, such as calories, fat, sodium, cholesterol and nutrients.;Long Term Goal: Adherence to prescribed nutrition plan.             Nutrition Assessments:  Nutrition Assessments - 06/09/23 1436       Rate Your Plate Scores   Pre Score 74            MEDIFICTS Score Key: ?70 Need to make dietary changes  40-70 Heart Healthy Diet ? 40 Therapeutic Level Cholesterol Diet  Flowsheet Row PULMONARY REHAB CHRONIC OBSTRUCTIVE PULMONARY DISEASE from 06/09/2023 in [facility]  Picture Your Plate Total Score on Admission 74      Picture Your Plate Scores: <30 Unhealthy dietary pattern with much room for improvement. 41-50 Dietary pattern unlikely to meet recommendations for good health and room for improvement. 51-60 More healthful dietary pattern, with some room for improvement.  >60 Healthy dietary pattern, although there may be some specific behaviors that could be improved.    Nutrition Goals Re-Evaluation:  Nutrition Goals Re-Evaluation     Row Name 06/09/23 1431             Goals   Current Weight 118 lb 2.7 oz (53.6 kg)       Comment Cr 3.1, GFR 16       Expected Outcome [name] reports following a vegetarian diet for >30 years; her primary protein sources are beans, dairy, etc. She is mindful of sodium. She is on ozempic for blood sugar control and recently stopped lantus; her A1c is 5.9. She does report blood sugar  lows 2-3x/week in the middle of the night. She continues to follow-up with [facility] for kidney transplant. She is motivated to maintain her weight; she does not unwanted weight loss since starting Ozempic. [name] will continue to benefit from participation in pulmonary rehab for nutrition and exercise  support.                Nutrition Goals Discharge (Final Nutrition Goals Re-Evaluation):  Nutrition Goals Re-Evaluation - 06/09/23 1431       Goals   Current Weight 118 lb 2.7 oz (53.6 kg)    Comment Cr 3.1, GFR 16    Expected Outcome [name] reports following a vegetarian diet for >30 years; her primary protein sources are beans, dairy, etc. She is mindful of sodium. She is on ozempic for blood sugar control and recently stopped lantus; her A1c is 5.9. She does report blood sugar lows 2-3x/week in the middle of the night. She continues to follow-up with [facility] for kidney transplant. She is motivated to maintain her weight; she does not unwanted weight loss since starting Ozempic. [name] will continue to benefit from participation in pulmonary rehab for nutrition and exercise support.             Psychosocial: Target Goals: Acknowledge presence or absence of significant depression and/or stress, maximize coping skills, provide positive support system. Participant is able to verbalize types and ability to use techniques and skills needed for reducing stress and depression.  Initial Review & Psychosocial Screening:  Initial Psych Review & Screening - 06/03/23 1347       Initial Review   Current issues with Current Depression;History of Depression;Current Anxiety/Panic;Current Psychotropic Meds;Current Stress Concerns    Source of Stress Concerns Family;Chronic Illness    Comments Pt states she is worried about her husband who is currently residing in a skilled nursing facility. She stated he suffered a frontal lobe injury causing dementia. She also expressed his health is not good as he has  suffered 2 heart attacks and strokes. She is also worried about her only child, her daughter, who she states is living on a LVAD and "isn't doing well". [name] states her brother recently passed away from cardiac and kidney problems. She also worries about her cardiac health and is currently on the kidney transplant list.      Family Dynamics   Good Support System? Yes    Concerns Recent loss of significant other    Comments [name] states she has a small family but has supportive friends      Barriers   Psychosocial barriers to participate in program The patient should benefit from training in stress management and relaxation.;Psychosocial barriers identified (see note)      Screening Interventions   Interventions Encouraged to exercise;To provide support and resources with identified psychosocial needs;Provide feedback about the scores to participant   Brenee stated she is currently on psychotropic meds and sees a therapist regularly. She declines any needs at the time.   Expected Outcomes Short Term goal: Utilizing psychosocial counselor, staff and physician to assist with identification of specific Stressors or current issues interfering with healing process. Setting desired goal for each stressor or current issue identified.;Long Term Goal: Stressors or current issues are controlled or eliminated.;Short Term goal: Identification and review with participant of any Quality of Life or Depression concerns found by scoring the questionnaire.;Long Term goal: The participant improves quality of Life and PHQ9 Scores as seen by post scores and/or verbalization of changes             Quality of Life Scores:  Scores of 19 and below usually indicate a poorer quality of life in these areas.  A difference of  2-3 points is a clinically meaningful difference.  A difference of 2-3 points in the total  score of the Quality of Life Index has been associated with significant improvement in overall quality of  life, self-image, physical symptoms, and general health in studies assessing change in quality of life.  PHQ-9: Review Flowsheet       06/03/2023  Depression screen PHQ 2/9  Decreased Interest 2  Down, Depressed, Hopeless 2  PHQ - 2 Score 4  Altered sleeping 1  Tired, decreased energy 2  Change in appetite 3  Feeling bad or failure about yourself  1  Trouble concentrating 1  Moving slowly or fidgety/restless 3  Suicidal thoughts 0  PHQ-9 Score 15  Difficult doing work/chores Somewhat difficult    Details           Interpretation of Total Score  Total Score Depression Severity:  1-4 = Minimal depression, 5-9 = Mild depression, 10-14 = Moderate depression, 15-19 = Moderately severe depression, 20-27 = Severe depression   Psychosocial Evaluation and Intervention:  Psychosocial Evaluation - 06/03/23 1352       Psychosocial Evaluation & Interventions   Interventions Encouraged to exercise with the program and follow exercise prescription;Relaxation education;Stress management education    Comments [name] stated she is currently on psychotropic meds and sees a therapist regularly. She declines any needs at the time.    Expected Outcomes For [name] to attend pulmonary rehab and to have a positive outlook and good coping skills to manage her stress. For [name] to be compliant in taking her psychotropic meds and continue seeing her therapist regularly.    Continue Psychosocial Services  Follow up required by staff             Psychosocial Re-Evaluation:  Psychosocial Re-Evaluation     Row Name 06/10/23 1150             Psychosocial Re-Evaluation   Current issues with Current Stress Concerns;Current Psychotropic Meds;Current Anxiety/Panic;History of Depression;Current Depression       Comments No changes since [name] has started the program. [name] has completed 1 class so far.       Expected Outcomes For [name] to participate in PR without any psychosocial barriers or  concerns.       Interventions Encouraged to attend Pulmonary Rehabilitation for the exercise;Stress management education;Relaxation education       Continue Psychosocial Services  Follow up required by staff                Psychosocial Discharge (Final Psychosocial Re-Evaluation):  Psychosocial Re-Evaluation - 06/10/23 1150       Psychosocial Re-Evaluation   Current issues with Current Stress Concerns;Current Psychotropic Meds;Current Anxiety/Panic;History of Depression;Current Depression    Comments No changes since [name] has started the program. [name] has completed 1 class so far.    Expected Outcomes For [name] to participate in PR without any psychosocial barriers or concerns.    Interventions Encouraged to attend Pulmonary Rehabilitation for the exercise;Stress management education;Relaxation education    Continue Psychosocial Services  Follow up required by staff             Education: Education Goals: Education classes will be provided on a weekly basis, covering required topics. Participant will state understanding/return demonstration of topics presented.  Learning Barriers/Preferences:  Learning Barriers/Preferences - 06/03/23 1542       Learning Barriers/Preferences   Learning Barriers None    Learning Preferences Group Instruction;Individual Instruction;Verbal Instruction;Written Material             Education Topics: Introduction to Pulmonary Rehab Group instruction provided by  PowerPoint, verbal discussion, and written material to support subject matter. Instructor reviews what Pulmonary Rehab is, the purpose of the program, and how patients are referred.     Know Your Numbers Group instruction that is supported by a PowerPoint presentation. Instructor discusses importance of knowing and understanding resting, exercise, and post-exercise oxygen saturation, heart rate, and blood pressure. Oxygen saturation, heart rate, blood pressure, rating of  perceived exertion, and dyspnea are reviewed along with a normal range for these values.    Exercise for the Pulmonary Patient Group instruction that is supported by a PowerPoint presentation. Instructor discusses benefits of exercise, core components of exercise, frequency, duration, and intensity of an exercise routine, importance of utilizing pulse oximetry during exercise, safety while exercising, and options of places to exercise outside of rehab.       MET Level  Group instruction provided by PowerPoint, verbal discussion, and written material to support subject matter. Instructor reviews what METs are and how to increase METs.    Pulmonary Medications Verbally interactive group education provided by instructor with focus on inhaled medications and proper administration.   Anatomy and Physiology of the Respiratory System Group instruction provided by PowerPoint, verbal discussion, and written material to support subject matter. Instructor reviews respiratory cycle and anatomical components of the respiratory system and their functions. Instructor also reviews differences in obstructive and restrictive respiratory diseases with examples of each.    Oxygen Safety Group instruction provided by PowerPoint, verbal discussion, and written material to support subject matter. There is an overview of "What is Oxygen" and "Why do we need it".  Instructor also reviews how to create a safe environment for oxygen use, the importance of using oxygen as prescribed, and the risks of noncompliance. There is a brief discussion on traveling with oxygen and resources the patient may utilize.   Oxygen Use Group instruction provided by PowerPoint, verbal discussion, and written material to discuss how supplemental oxygen is prescribed and different types of oxygen supply systems. Resources for more information are provided.    Breathing Techniques Group instruction that is supported by demonstration  and informational handouts. Instructor discusses the benefits of pursed lip and diaphragmatic breathing and detailed demonstration on how to perform both.     Risk Factor Reduction Group instruction that is supported by a PowerPoint presentation. Instructor discusses the definition of a risk factor, different risk factors for pulmonary disease, and how the heart and lungs work together.   MD Day A group question and answer session with a medical doctor that allows participants to ask questions that relate to their pulmonary disease state.   Nutrition for the Pulmonary Patient Group instruction provided by PowerPoint slides, verbal discussion, and written materials to support subject matter. The instructor gives an explanation and review of healthy diet recommendations, which includes a discussion on weight management, recommendations for fruit and vegetable consumption, as well as protein, fluid, caffeine, fiber, sodium, sugar, and alcohol. Tips for eating when patients are short of breath are discussed.    Other Education Group or individual verbal, written, or video instructions that support the educational goals of the pulmonary rehab program.    Knowledge Questionnaire Score:  Knowledge Questionnaire Score - 06/03/23 1530       Knowledge Questionnaire Score   Pre Score 16/18             Core Components/Risk Factors/Patient Goals at Admission:  Personal Goals and Risk Factors at Admission - 06/03/23 1343       Core  Components/Risk Factors/Patient Goals on Admission    Weight Management Yes;Weight Maintenance    Admit Weight 118 lb 2.7 oz (53.6 kg)    Improve shortness of breath with ADL's Yes    Intervention Provide education, individualized exercise plan and daily activity instruction to help decrease symptoms of SOB with activities of daily living.    Expected Outcomes Short Term: Improve cardiorespiratory fitness to achieve a reduction of symptoms when performing  ADLs;Long Term: Be able to perform more ADLs without symptoms or delay the onset of symptoms    Increase knowledge of respiratory medications and ability to use respiratory devices properly  Yes    Intervention Provide education and demonstration as needed of appropriate use of medications, inhalers, and oxygen therapy.    Expected Outcomes Short Term: Achieves understanding of medications use. Understands that oxygen is a medication prescribed by physician. Demonstrates appropriate use of inhaler and oxygen therapy.;Long Term: Maintain appropriate use of medications, inhalers, and oxygen therapy.    Stress Yes    Intervention Offer individual and/or small group education and counseling on adjustment to heart disease, stress management and health-related lifestyle change. Teach and support self-help strategies.;Refer participants experiencing significant psychosocial distress to appropriate mental health specialists for further evaluation and treatment. When possible, include family members and significant others in education/counseling sessions.    Expected Outcomes Short Term: Participant demonstrates changes in health-related behavior, relaxation and other stress management skills, ability to obtain effective social support, and compliance with psychotropic medications if prescribed.;Long Term: Emotional wellbeing is indicated by absence of clinically significant psychosocial distress or social isolation.             Core Components/Risk Factors/Patient Goals Review:   Goals and Risk Factor Review     Row Name 06/10/23 1152             Core Components/Risk Factors/Patient Goals Review   Personal Goals Review Weight Management/Obesity;Improve shortness of breath with ADL's;Develop more efficient breathing techniques such as purse lipped breathing and diaphragmatic breathing and practicing self-pacing with activity.;Stress;Increase knowledge of respiratory medications and ability to use  respiratory devices properly.       Review Unable to assess progress. [name] has completed 1 class so far.       Expected Outcomes See admission goals                Core Components/Risk Factors/Patient Goals at Discharge (Final Review):   Goals and Risk Factor Review - 06/10/23 1152       Core Components/Risk Factors/Patient Goals Review   Personal Goals Review Weight Management/Obesity;Improve shortness of breath with ADL's;Develop more efficient breathing techniques such as purse lipped breathing and diaphragmatic breathing and practicing self-pacing with activity.;Stress;Increase knowledge of respiratory medications and ability to use respiratory devices properly.    Review Unable to assess progress. [name] has completed 1 class so far.    Expected Outcomes See admission goals             ITP Comments:Pt is making expected progress toward Pulmonary Rehab goals after completing 2 sessions. Recommend continued exercise, life style modification, education, and utilization of breathing techniques to increase stamina and strength, while also decreasing shortness of breath with exertion.  Dr. Mechele Collin is Medical Director for Pulmonary Rehab at [facility].     Comments: Dr. Mechele Collin is Medical Director for Pulmonary Rehab at [facility]

## 2023-06-16 ENCOUNTER — Encounter (HOSPITAL_COMMUNITY)
Admission: RE | Admit: 2023-06-16 | Discharge: 2023-06-16 | Disposition: A | Discharge status: Home / Self Care | Payer: Medicare HMO | Source: Ambulatory Visit | Attending: Cardiovascular Disease | Admitting: Cardiovascular Disease

## 2023-06-16 ENCOUNTER — Encounter (HOSPITAL_COMMUNITY): Payer: Medicare HMO

## 2023-06-16 DIAGNOSIS — I5032 Chronic diastolic (congestive) heart failure: Secondary | ICD-10-CM

## 2023-06-16 NOTE — Progress Notes (Signed)
Daily Session Note  Patient Details  Name: [name] MRN: [id number] Date of Birth: [birth year] Referring Provider:   Doristine Devoid Pulmonary Rehab Walk Test from 06/03/2023 in [facility]  Referring Provider Allyson Sabal       Encounter Date: 06/16/2023  Check In:  Session Check In - 06/16/23 1333       Check-In   Supervising physician immediately available to respond to emergencies [facility] MD immediately available    Physician(s) Joni Reining, NP    Location [facility]    Staff Present Raford Pitcher, MS, ACSM-CEP, Exercise Physiologist;Randi Dionisio Paschal, ACSM-CEP, Exercise Physiologist; Gerre Scull, RN, [name] Plan, RT    Virtual Visit No    Medication changes reported     No    Fall or balance concerns reported    No    Tobacco Cessation No Change    Warm-up and Cool-down Performed as group-led instruction    Resistance Training Performed Yes    VAD Patient? No    PAD/SET Patient? No      Pain Assessment   Currently in Pain? No/denies    Multiple Pain Sites No             Capillary Blood Glucose: No results found for this or any previous visit (from the past 24 hour(s)).    Social History   Tobacco Use  Smoking Status Never  Smokeless Tobacco Never    Goals Met:  Independence with exercise equipment Exercise tolerated well No report of concerns or symptoms today Strength training completed today  Goals Unmet:  Not Applicable  Comments: Service time is from 1326 to 1431    Dr. Mechele Collin is Medical Director for Pulmonary Rehab at [facility].

## 2023-06-21 ENCOUNTER — Encounter (HOSPITAL_COMMUNITY)
Admission: RE | Admit: 2023-06-21 | Discharge: 2023-06-21 | Disposition: A | Discharge status: Home / Self Care | Payer: Medicare HMO | Source: Ambulatory Visit | Attending: Cardiovascular Disease | Admitting: Cardiovascular Disease

## 2023-06-21 ENCOUNTER — Encounter (HOSPITAL_COMMUNITY): Payer: Medicare HMO

## 2023-06-21 DIAGNOSIS — I5032 Chronic diastolic (congestive) heart failure: Secondary | ICD-10-CM

## 2023-06-21 NOTE — Progress Notes (Signed)
Daily Session Note  Patient Details  Name: [name] MRN: [id number] Date of Birth: 03/02/1951 Referring Provider:   Doristine Devoid Pulmonary Rehab Walk Test from 06/03/2023 in [facility]  Referring Provider Allyson Sabal       Encounter Date: 06/21/2023  Check In:  Session Check In - 06/21/23 1334       Check-In   Supervising physician immediately available to respond to emergencies [facility] MD immediately available    Physician(s) Jari Favre, PA    Location [facility]    Staff Present Raford Pitcher, MS, ACSM-CEP, Exercise Physiologist;Randi Dionisio Paschal, ACSM-CEP, Exercise Physiologist;Casey Katrinka Blazing, RT    Virtual Visit No    Medication changes reported     No    Fall or balance concerns reported    No    Tobacco Cessation No Change    Warm-up and Cool-down Performed as group-led instruction    Resistance Training Performed Yes    VAD Patient? No    PAD/SET Patient? No      Pain Assessment   Currently in Pain? No/denies    Multiple Pain Sites No             Capillary Blood Glucose: No results found for this or any previous visit (from the past 24 hour(s)).    Social History   Tobacco Use  Smoking Status Never  Smokeless Tobacco Never    Goals Met:  Independence with exercise equipment Exercise tolerated well No report of concerns or symptoms today Strength training completed today  Goals Unmet:  Not Applicable  Comments: Service time is from 1320 to 1441    Dr. Mechele Collin is Medical Director for Pulmonary Rehab at [facility].

## 2023-06-22 ENCOUNTER — Telehealth: Payer: Self-pay

## 2023-06-22 NOTE — Telephone Encounter (Signed)
 Renal coordinator attempted to call patient on today regarding [facility] referral. No answer from patient after multiple rings. Coordinator left confidential voicemail for patient to return call.  Will attempt to call back within 1 week.   Baruch Gouty Renal Coordinator [facility] [phone]

## 2023-06-23 ENCOUNTER — Encounter (HOSPITAL_COMMUNITY): Payer: Medicare HMO

## 2023-06-23 ENCOUNTER — Encounter (HOSPITAL_COMMUNITY)
Admission: RE | Admit: 2023-06-23 | Discharge: 2023-06-23 | Disposition: A | Discharge status: Home / Self Care | Payer: Medicare HMO | Source: Ambulatory Visit | Attending: Cardiovascular Disease | Admitting: Cardiovascular Disease

## 2023-06-23 VITALS — Wt 116.0 lb

## 2023-06-23 DIAGNOSIS — I5032 Chronic diastolic (congestive) heart failure: Secondary | ICD-10-CM | POA: Diagnosis not present

## 2023-06-23 NOTE — Progress Notes (Signed)
Daily Session Note  Patient Details  Name: [name] MRN: [id number] Date of Birth: 1951-08-22 Referring Provider:   Doristine Devoid Pulmonary Rehab Walk Test from 06/03/2023 in [facility]  Referring Provider Allyson Sabal       Encounter Date: 06/23/2023  Check In:  Session Check In - 06/23/23 1328       Check-In   Supervising physician immediately available to respond to emergencies [facility] MD immediately available    Physician(s) Edd Fabian, NP    Location [facility]    Staff Present Raford Pitcher, MS, ACSM-CEP, Exercise Physiologist;Randi Dionisio Paschal, ACSM-CEP, Exercise Physiologist;Samantha Belarus, RD, Dutch Gray, RN, [name] Plan, RT    Virtual Visit No    Medication changes reported     No    Fall or balance concerns reported    No    Tobacco Cessation No Change    Warm-up and Cool-down Performed as group-led instruction    Resistance Training Performed Yes    VAD Patient? No    PAD/SET Patient? No      Pain Assessment   Currently in Pain? No/denies    Multiple Pain Sites No             Capillary Blood Glucose: No results found for this or any previous visit (from the past 24 hour(s)).    Social History   Tobacco Use  Smoking Status Never  Smokeless Tobacco Never    Goals Met:  Proper associated with RPD/PD & O2 Sat Independence with exercise equipment Exercise tolerated well No report of concerns or symptoms today Strength training completed today  Goals Unmet:  Not Applicable  Comments: Service time is from 1325 to 1444.    Dr. Mechele Collin is Medical Director for Pulmonary Rehab at [facility].

## 2023-06-24 ENCOUNTER — Ambulatory Visit: Payer: Medicare HMO | Attending: Physician Assistant | Admitting: Physician Assistant

## 2023-06-24 ENCOUNTER — Encounter: Payer: Self-pay | Admitting: Physician Assistant

## 2023-06-24 VITALS — BP 118/70 | HR 70 | Ht 63.5 in | Wt 114.8 lb

## 2023-06-24 DIAGNOSIS — I2510 Atherosclerotic heart disease of native coronary artery without angina pectoris: Secondary | ICD-10-CM | POA: Diagnosis not present

## 2023-06-24 DIAGNOSIS — Z95810 Presence of automatic (implantable) cardiac defibrillator: Secondary | ICD-10-CM

## 2023-06-24 DIAGNOSIS — I5022 Chronic systolic (congestive) heart failure: Secondary | ICD-10-CM | POA: Diagnosis not present

## 2023-06-24 DIAGNOSIS — I255 Ischemic cardiomyopathy: Secondary | ICD-10-CM | POA: Diagnosis not present

## 2023-06-24 DIAGNOSIS — Z7682 Awaiting organ transplant status: Secondary | ICD-10-CM | POA: Diagnosis not present

## 2023-06-24 NOTE — Patient Instructions (Signed)
Medication Instructions:   Your physician recommends that you continue on your current medications as directed. Please refer to the Current Medication list given to you today.   *If you need a refill on your cardiac medications before your next appointment, please call your pharmacy*   Lab Work: NONE ORDERED  TODAY    If you have labs (blood work) drawn today and your tests are completely normal, you will receive your results only by: MyChart Message (if you have MyChart) OR A paper copy in the mail If you have any lab test that is abnormal or we need to change your treatment, we will call you to review the results.   Testing/Procedures: NONE ORDERED  TODAY     Follow-Up: At [facility], you and your health needs are our priority.  As part of our continuing mission to provide you with exceptional heart care, we have created designated Provider Care Teams.  These Care Teams include your primary Cardiologist (physician) and Advanced Practice Providers (APPs -  Physician Assistants and Nurse Practitioners) who all work together to provide you with the care you need, when you need it.  We recommend signing up for the patient portal called "MyChart".  Sign up information is provided on this After Visit Summary.  MyChart is used to connect with patients for Virtual Visits (Telemedicine).  Patients are able to view lab/test results, encounter notes, upcoming appointments, etc.  Non-urgent messages can be sent to your provider as well.   To learn more about what you can do with MyChart, go to [email or web address].    Your next appointment:    4 month(s)  Provider:    Lewayne Bunting, MD or Francis Dowse, PA-C    Other Instructions

## 2023-06-24 NOTE — Progress Notes (Signed)
Cardiology Office Note Date:  06/24/2023  Patient ID:  Emily, [name] Apr 19, 1951, MRN [id number] PCP:  Merri Brunette, MD  Cardiologist:  Dr. Katrinka Blazing >> Dr. Allyson Sabal Electrophysiologist: Dr. Ladona Ridgel    Chief Complaint:  annual EP/device visit  History of Present Illness: [name] is a 72 y.o. female with history of anxiety, asthma, CAD (CABG 1998), ICM, chronic CHF (systolic), ICD, HTN, HLD, stroke, DM, VHD (mod MR), CKD (IV)  She saw Dr. Ladona Ridgel 12/29/21, had been on Ozempic with successful weight loss, feeling well though mentioned an episode of CP near her device > felt to be atypical/not cardiac, pocket looked OK  She saw Dr. Allyson Sabal 04/29/23, he mentions she was on renal transplant list at [facility], for her CKD, doing well, no changes were made  TODAY She is doing all in all well, following with her nephrologist and [facility] team closely Labs are done via them  No CP, palpitations Denies SOB No dizziness, near syncope or syncope She is worried bout her device battery.   Device information MDT CRT-D implanted 04/17/2008, gen change 2016  Past Medical History:  Diagnosis Date   AICD (automatic cardioverter/defibrillator) present    [employer]- Dr. Rosette Reveal follows   Anginal pain (HCC)    Anxiety    Asthma    Back pain    "pinched nerve-lower back" - Dr. Ethelene Hal follows.   Biventricular implantable cardioverter-defibrillator in situ 06/18/2011   Cerebral infarction (HCC) 11/11/2012   Cervical dysplasia    Chronic diastolic heart failure (HCC) 03/22/2016   Chronic kidney disease    Dr. Allena Katz follows.   Chronic renal insufficiency, stage III (moderate) (HCC) 11/11/2012   Complication of anesthesia    "I wake up during surgeries" (02/14/2013)   Coronary artery disease involving coronary bypass graft of native heart with angina pectoris (HCC) 06/18/2011   Depression    DM (diabetes mellitus) (HCC) 11/11/2012   Fibroid    Function kidney decreased    GERD (gastroesophageal  reflux disease)    Hemiplegia, unspecified, affecting nondominant side 11/11/2012   Hepatitis    Hepatitis A -college yrs"water source exposure"   Hiatal hernia    History of shingles    2-3 yrs ago last out break "around waist"   History of stomach ulcers    Hyperlipidemia 07/30/2016   Hypertension    ICD (implantable cardiac defibrillator) in place    Iron deficiency anemia    Ischemic cardiomyopathy    status post biventricular ICD placed by DR Edumunds who used to see Dr Elsie Lincoln here to establish  cardiovascular care.   Migraines    MVP (mitral valve prolapse)    Antibiotics not required for procedures   Myocardial infarction (HCC)    "I've had 2; the others they were able to catch before completing" (02/14/2013)   Pacemaker    Paroxysmal SVT (supraventricular tachycardia) 06/18/2011   Pneumonia 1950's & 1985   Shortness of breath    "lying down flat; at times w/exertion" (02/14/2013). 10-06-15 exertion only..   Stroke [facility])    "2 confirmed; 9 TIA's; results in dragging LLE; numbness in tip of tongue" (02/14/2013),10-06-15 right hand tends to be weaker when tired.   TIA (transient ischemic attack) 11/11/2012   Type II diabetes mellitus [facility])     Past Surgical History:  Procedure Laterality Date   ABDOMINAL HYSTERECTOMY  1985   TAH    BIV ICD GENERTAOR CHANGE OUT  06/2007; 04/2008   "2 lead initial placement,  at [facility]; [facility], after developing CHF" (02/14/2013)   BIV PACEMAKER GENERATOR CHANGE OUT N/A 01/29/2015   Procedure: BIV PACEMAKER GENERATOR CHANGE OUT;  Surgeon: Marinus Maw, MD;  Location: [facility] CATH LAB;  Service: Cardiovascular;  Laterality: N/A;   BREAST EXCISIONAL BIOPSY Left 01/2007; 06/2007; 03/2008   "benign" (02/14/2013)   BREAST SURGERY     CARDIAC CATHETERIZATION     "probably in the teens" (02/14/2013)   CARDIAC DEFIBRILLATOR PLACEMENT     COLONOSCOPY  ~ 2002   COLONOSCOPY WITH PROPOFOL N/A 10/07/2015   Procedure: COLONOSCOPY WITH PROPOFOL;  Surgeon: Charna Elizabeth, MD;   Location: [facility] ENDOSCOPY;  Service: Endoscopy;  Laterality: N/A;   CORONARY ANGIOPLASTY WITH STENT PLACEMENT     "started out w/5; bypass corrected some; 1 stent since the bypass" (02/14/2013)   CORONARY ARTERY BYPASS GRAFT  ` 1998   LIMA-LAD, SVG-D1, SVG-PDA   DILATION AND CURETTAGE OF UTERUS  1975 X 2; 1976; 1977   INSERT / REPLACE / REMOVE PACEMAKER     biventricular defibrillator--06/10/ 2009   LEFT HEART CATH AND CORS/GRAFTS ANGIOGRAPHY N/A 08/04/2018   Procedure: LEFT HEART CATH AND CORS/GRAFTS ANGIOGRAPHY;  Surgeon: Kathleene Hazel, MD;  Location: [facility] INVASIVE CV LAB;  Service: Cardiovascular;  Laterality: N/A;   LEFT HEART CATHETERIZATION WITH CORONARY/GRAFT ANGIOGRAM N/A 01/03/2015   Procedure: LEFT HEART CATHETERIZATION WITH Isabel Caprice;  Surgeon: Lesleigh Noe, MD;  Location: [facility] CATH LAB;  Service: Cardiovascular;  Laterality: N/A;   SUPRAVENTRICULAR TACHYCARDIA ABLATION  06/2007   TEE WITHOUT CARDIOVERSION  11/14/2012   Procedure: TRANSESOPHAGEAL ECHOCARDIOGRAM (TEE);  Surgeon: Donato Schultz, MD;  Location: [facility] ENDOSCOPY;  Service: Cardiovascular;  Laterality: N/A;    Current Outpatient Medications  Medication Sig Dispense Refill   albuterol (PROVENTIL HFA;VENTOLIN HFA) 108 (90 BASE) MCG/ACT inhaler Inhale 2 puffs into the lungs every 4 (four) hours as needed for wheezing or shortness of breath.     Alcohol Swabs (B-D SINGLE USE SWABS REGULAR) PADS      ALPRAZolam (XANAX) 1 MG tablet Take 1 mg by mouth 3 (three) times daily.   2   Artificial Tear GEL Place 2 drops into both eyes daily as needed (dry eyes).      aspirin 325 MG EC tablet Take 325 mg by mouth daily.     benzonatate (TESSALON) 100 MG capsule Take 1 capsule (100 mg total) by mouth 3 (three) times daily as needed for cough. 30 capsule 0   BIOTIN PO Take 1 tablet by mouth daily.     cetirizine (ZYRTEC) 10 MG tablet Take 10 mg by mouth daily.     Cholecalciferol (VITAMIN D-3) 1000 UNITS CAPS Take 1,000  Units by mouth daily.      clopidogrel (PLAVIX) 75 MG tablet TAKE 1 TABLET EVERY DAY 90 tablet 3   cyclobenzaprine (FLEXERIL) 5 MG tablet Take 1 tablet (5 mg total) by mouth at bedtime as needed for muscle spasms. 30 tablet 0   Docusate Sodium (DSS) 250 MG CAPS Take 1 capsule by mouth as needed.     doxazosin (CARDURA) 4 MG tablet Take 4 mg by mouth at bedtime.      DROPLET PEN NEEDLES 32G X 4 MM MISC      EPINEPHrine 0.3 mg/0.3 mL IJ SOAJ injection Inject 0.3 mg into the muscle as needed for anaphylaxis.   3   ezetimibe (ZETIA) 10 MG tablet TAKE 1 TABLET EVERY DAY 90 tablet 3   Flaxseed, Linseed, (FLAXSEED OIL)  1200 MG CAPS Take 1,200 mg by mouth daily.     fluticasone (FLONASE) 50 MCG/ACT nasal spray Place 2 sprays into both nostrils daily as needed for allergies or rhinitis.     furosemide (LASIX) 80 MG tablet TAKE 1 TABLET TWICE A DAY. MAY TAKE AN ADDITIONAL 80MG  (1 TABLET) AS NEEDED FOR EDEMA 270 tablet 3   hydrALAZINE (APRESOLINE) 25 MG tablet TAKE 1 TABLET TWICE DAILY 180 tablet 3   isosorbide mononitrate (IMDUR) 60 MG 24 hr tablet TAKE 1 TABLET EVERY DAY 90 tablet 3   Ketotifen Fumarate (ALAWAY OP) Place 1 drop into both eyes daily as needed (allergies).      LANTUS SOLOSTAR 100 UNIT/ML Solostar Pen Inject 6 Units into the skin at bedtime.     metoprolol succinate (TOPROL-XL) 100 MG 24 hr tablet TAKE 2 TABLETS EVERY DAY WITH OR IMMEDIATELY FOLLOWING A MEAL 180 tablet 3   Multiple Vitamin (MULTIVITAMIN WITH MINERALS) TABS Take 1 tablet by mouth daily.     nitroGLYCERIN (NITROSTAT) 0.4 MG SL tablet Place 1 tablet under the tongue every 5 minutes as needed for chest pain, max 3 doses, go to er if no relief 75 tablet 2   olmesartan (BENICAR) 40 MG tablet TAKE 1 TABLET EVERY DAY 90 tablet 3   ondansetron (ZOFRAN) 8 MG tablet Take 8 mg by mouth every 8 (eight) hours as needed for nausea or vomiting.     OZEMPIC, 0.25 OR 0.5 MG/DOSE, 2 MG/1.5ML SOPN Inject 0.25 mg into the skin once a week.  Sunday     pantoprazole (PROTONIX) 40 MG tablet TAKE 1 TABLET BY MOUTH  DAILY 90 tablet 3   potassium chloride (KLOR-CON M) 10 MEQ tablet TAKE 2 TABLETS TWICE A DAY (KEEP MD APPOINTMENT FOR REFILLS) 360 tablet 3   simvastatin (ZOCOR) 40 MG tablet TAKE 1 TABLET (40 MG TOTAL) BY MOUTH AT BEDTIME. 90 tablet 3   sodium chloride (MURO 128) 5 % ophthalmic ointment Place 1 drop into both eyes at bedtime. For dry eyes     traMADol (ULTRAM) 50 MG tablet Take 100 mg by mouth every 6 (six) hours as needed (MIGRAINES).     TRUE METRIX BLOOD GLUCOSE TEST test strip      TRUEplus Lancets 33G MISC      venlafaxine (EFFEXOR) 100 MG tablet Take 100-200 mg by mouth See admin instructions. TAKE 200 mg by mouth in the morning and take 100 mg at noon     vitamin A 7500 UNIT capsule Take 2,400 Units by mouth daily.     No current facility-administered medications for this visit.    Allergies:   Calcium channel blockers, Codeine, Lyrica [pregabalin], Other, Ramipril, Ticlid [ticlopidine hcl], Morphine and codeine, Tape, Digoxin, Digoxin and related, Diltiazem hcl, Diltiazem hcl er beads, Metformin hcl, Metolazone, Milk-related compounds, Morphine sulfate, Myrbetriq [mirabegron], Nsaids, Onglyza [saxagliptin], Penicillin v, Penicillins, Spironolactone, Sulfa antibiotics, Tetracycline hcl, Tetracyclines & related, Ticlopidine, Verapamil, Vicodin [hydrocodone-acetaminophen], and Latex   Social History:  The patient  reports that she has never smoked. She has never used smokeless tobacco. She reports that she does not drink alcohol and does not use drugs.   Family History:  The patient's family history includes Diabetes in her brother, father, and paternal grandmother; Heart attack in her brother, father, and mother; Heart disease in her brother, father, and mother; Hypertension in her father and mother; Kidney failure in her brother and father; Stroke in her father and mother.  ROS:  Please see the history  of present  illness.  All other systems are reviewed and otherwise negative.   PHYSICAL EXAM:  VS:  BP 118/70   Pulse 70   Ht 5' 3.5" (1.613 m)   Wt 114 lb 12.8 oz (52.1 kg)   SpO2 98%   BMI 20.02 kg/m  BMI: Body mass index is 20.02 kg/m. Quite thin body habitus in no acute distress  HEENT: normocephalic, atraumatic  Neck: no JVD, carotid bruits or masses Cardiac: RRR; no significant murmurs, no rubs, or gallops Lungs: CTA b/l, no wheezing, rhonchi or rales  Abd: soft, nontender MS: no deformity or atrophy Ext: no edema  Skin: warm and dry, no rash Neuro:  No gross deficits appreciated Psych: euthymic mood, full affect  ICD site is stable, no tethering or discomfort, skin is intact, no thinning/erosion   EKG:  Done today and reviewed by myself  SR/ V paced   ICD interrogation done today and reviewed by myself:  Battery estimate is 6mo to ERI Lead measurements are good some V sensed events Appear slow idioventricular, not new for her, rate 90's No arrhythmias BP 97.4%  09/29/22: stress myoview The study shows normal myocardial perfusion. The study is low risk.   No ST deviation was noted.   LV perfusion is normal.   Left ventricular function is abnormal. Global function is mildly reduced. Nuclear stress EF: 52 %. The left ventricular ejection fraction is mildly decreased (45-54%). End diastolic cavity size is normal. End systolic cavity size is normal.  09/29/22: TTE 1. Left ventricular ejection fraction, by estimation, is 45 to 50%. Left  ventricular ejection fraction by 2D MOD biplane is 48.4 %. The left  ventricle has mildly decreased function. The left ventricle has no  regional wall motion abnormalities. Left  ventricular diastolic parameters are consistent with Grade I diastolic  dysfunction (impaired relaxation).   2. Right ventricular systolic function is mildly reduced. The right  ventricular size is normal. There is normal pulmonary artery systolic  pressure. The  estimated right ventricular systolic pressure is 21.8 mmHg.   3. The mitral valve is abnormal. Mild mitral valve regurgitation.   4. The aortic valve is tricuspid. Aortic valve regurgitation is not  visualized.   5. The inferior vena cava is normal in size with greater than 50%  respiratory variability, suggesting right atrial pressure of 3 mmHg.   Comparison(s): 01/24/20 EF 50-55%. GLS -15.9%.    LEFT HEART CATH AND CORS/GRAFTS ANGIOGRAPHY 08/04/2018  Conclusion      Prox RCA lesion is 100% stenosed. SVG graft was visualized by angiography and is normal in caliber. Ost 1st Diag lesion is 100% stenosed. SVG graft was visualized by angiography and is normal in caliber. Prox LAD to Mid LAD lesion is 90% stenosed. Mid LM to Dist LM lesion is 50% stenosed. Mid Cx to Dist Cx lesion is 60% stenosed. Ost 2nd Mrg lesion is 50% stenosed.   1. Severe native vessel CAD s/p 3V CABG with 3/3 patent grafts 2. Mild distal left main stenosis 3. Severe mid LAD stenosis. Antegrade flow and retrograde flow in the mid and distal LAD. The LIMA to the mid LAD is patent. The Diagonal is occluded. The SVG to the Diagonal is patent.  4. The Circumflex is a large caliber artery with mild plaque in the mid segment. The distal AV groove Circumflex has chronic diffuse stenosis 5. The RCA is chronically occluded in the proximal segment. The SVG to the PDA is patent.    Recommendations: Continue  medical management of CAD    Echo 12/2015 Study Conclusions   - Left ventricle: The cavity size was normal. Systolic function was   mildly reduced. The estimated ejection fraction was in the range   of 45% to 50%. Diffuse hypokinesis. Features are consistent with   a pseudonormal left ventricular filling pattern, with concomitant   abnormal relaxation and increased filling pressure (grade 2   diastolic dysfunction). - Mitral valve: There was moderate regurgitation. - Left atrium: The atrium was moderately dilated. -  Right ventricle: Poorly visualized. - Tricuspid valve: There was mild regurgitation.     Recent Labs: No results found for requested labs within last 365 days.  No results found for requested labs within last 365 days.   CrCl cannot be calculated (Patient's most recent lab result is older than the maximum 21 days allowed.).   Wt Readings from Last 3 Encounters:  06/24/23 114 lb 12.8 oz (52.1 kg)  06/14/23 117 lb 8.1 oz (53.3 kg)  06/03/23 118 lb 2.7 oz (53.6 kg)     Other studies reviewed: Additional studies/records reviewed today include: summarized above  ASSESSMENT AND PLAN:   1. ICD     Intact function     no programming changes made  I have asked device clinic to start monthly battery checks Ill see her back in 4 mo  2. ICM 3. chronic CHF (systolic)     Last LVEF 45-50%, 2023, stable     No symptoms or exam findings to suggest volume OL     She weighs daily reports stable weights     optiVol looks very good     She is on BB/ARB, diuteric therapy     C/w Dr. Valetta Mole   4. CAD     h/o CABG     Medical tx by last cath in 2019     On ASA (325 she reports is as per Dr. Katrinka Blazing and her neurologist after her last stroke), plavix, BB, statin/zetia, nitrate      C/w Dr. Valetta Mole   5. HTN     No changes today       Disposition: as above, will have her back in anticipation of gen change in the next 5 mo or so   Current medicines are reviewed at length with the patient today.  The patient did not have any concerns regarding medicines.  Norma Fredrickson, PA-C 06/24/2023 7:26 PM     [facility] [address] [place] [address] [phone] (office)  [phone] (fax)

## 2023-06-25 LAB — CUP PACEART INCLINIC DEVICE CHECK
Battery Remaining Longevity: 5 mo
Battery Voltage: 2.82 V
Brady Statistic AP VP Percent: 0.03 %
Brady Statistic AP VS Percent: 0.04 %
Brady Statistic AS VP Percent: 98.59 %
Brady Statistic AS VS Percent: 1.35 %
Brady Statistic RA Percent Paced: 0.06 %
Brady Statistic RV Percent Paced: 0.11 %
Date Time Interrogation Session: 20240816093000
HighPow Impedance: 38 Ohm
HighPow Impedance: 52 Ohm
Implantable Lead Connection Status: 753985
Implantable Lead Connection Status: 753985
Implantable Lead Connection Status: 753985
Implantable Lead Implant Date: 20090610
Implantable Lead Implant Date: 20090610
Implantable Lead Implant Date: 20090610
Implantable Lead Location: 753858
Implantable Lead Location: 753859
Implantable Lead Location: 753860
Implantable Lead Model: 4194
Implantable Lead Model: 5076
Implantable Lead Model: 6947
Implantable Pulse Generator Implant Date: 20160323
Lead Channel Impedance Value: 285 Ohm
Lead Channel Impedance Value: 361 Ohm
Lead Channel Impedance Value: 475 Ohm
Lead Channel Impedance Value: 513 Ohm
Lead Channel Impedance Value: 551 Ohm
Lead Channel Impedance Value: 665 Ohm
Lead Channel Pacing Threshold Amplitude: 0.75 V
Lead Channel Pacing Threshold Amplitude: 0.75 V
Lead Channel Pacing Threshold Amplitude: 1.125 V
Lead Channel Pacing Threshold Pulse Width: 0.4 ms
Lead Channel Pacing Threshold Pulse Width: 0.4 ms
Lead Channel Pacing Threshold Pulse Width: 0.4 ms
Lead Channel Sensing Intrinsic Amplitude: 2.25 mV
Lead Channel Sensing Intrinsic Amplitude: 3.125 mV
Lead Channel Sensing Intrinsic Amplitude: 4.125 mV
Lead Channel Sensing Intrinsic Amplitude: 5.125 mV
Lead Channel Setting Pacing Amplitude: 1.75 V
Lead Channel Setting Pacing Amplitude: 2 V
Lead Channel Setting Pacing Amplitude: 2.25 V
Lead Channel Setting Pacing Pulse Width: 0.4 ms
Lead Channel Setting Pacing Pulse Width: 0.4 ms
Lead Channel Setting Sensing Sensitivity: 0.3 mV
Zone Setting Status: 755011
Zone Setting Status: 755011

## 2023-06-27 DIAGNOSIS — F411 Generalized anxiety disorder: Secondary | ICD-10-CM | POA: Diagnosis not present

## 2023-06-27 DIAGNOSIS — F331 Major depressive disorder, recurrent, moderate: Secondary | ICD-10-CM | POA: Diagnosis not present

## 2023-06-28 ENCOUNTER — Encounter (HOSPITAL_COMMUNITY): Payer: Medicare HMO

## 2023-06-28 ENCOUNTER — Encounter (HOSPITAL_COMMUNITY): Payer: Self-pay

## 2023-06-30 ENCOUNTER — Encounter (HOSPITAL_COMMUNITY): Payer: Medicare HMO

## 2023-07-05 ENCOUNTER — Encounter (HOSPITAL_COMMUNITY): Payer: Medicare HMO

## 2023-07-07 ENCOUNTER — Encounter (HOSPITAL_COMMUNITY): Payer: Medicare HMO

## 2023-07-12 ENCOUNTER — Encounter (HOSPITAL_COMMUNITY): Payer: Medicare HMO

## 2023-07-12 NOTE — Addendum Note (Signed)
Encounter addended by: [place], Todrick Siedschlag G, RD on: 07/12/2023 2:59 PM  Actions taken: Flowsheet data copied forward, Flowsheet accepted

## 2023-07-13 NOTE — Progress Notes (Signed)
Pulmonary Individual Treatment Plan  Patient Details  Name: [name] MRN: [id number] Date of Birth: 12-04-1950 Referring Provider:   Doristine Devoid Pulmonary Rehab Walk Test from 06/03/2023 in [facility]  Referring Provider Allyson Sabal       Initial Encounter Date:  Flowsheet Row Pulmonary Rehab Walk Test from 06/03/2023 in [facility]  Date 06/03/23       Visit Diagnosis: Heart failure, diastolic, chronic (HCC)  Patient's Home Medications on Admission:   Current Outpatient Medications:    albuterol (PROVENTIL HFA;VENTOLIN HFA) 108 (90 BASE) MCG/ACT inhaler, Inhale 2 puffs into the lungs every 4 (four) hours as needed for wheezing or shortness of breath., Disp: , Rfl:    Alcohol Swabs (B-D SINGLE USE SWABS REGULAR) PADS, , Disp: , Rfl:    ALPRAZolam (XANAX) 1 MG tablet, Take 1 mg by mouth 3 (three) times daily. , Disp: , Rfl: 2   Artificial Tear GEL, Place 2 drops into both eyes daily as needed (dry eyes). , Disp: , Rfl:    aspirin 325 MG EC tablet, Take 325 mg by mouth daily., Disp: , Rfl:    benzonatate (TESSALON) 100 MG capsule, Take 1 capsule (100 mg total) by mouth 3 (three) times daily as needed for cough., Disp: 30 capsule, Rfl: 0   BIOTIN PO, Take 1 tablet by mouth daily., Disp: , Rfl:    cetirizine (ZYRTEC) 10 MG tablet, Take 10 mg by mouth daily., Disp: , Rfl:    Cholecalciferol (VITAMIN D-3) 1000 UNITS CAPS, Take 1,000 Units by mouth daily. , Disp: , Rfl:    clopidogrel (PLAVIX) 75 MG tablet, TAKE 1 TABLET EVERY DAY, Disp: 90 tablet, Rfl: 3   cyclobenzaprine (FLEXERIL) 5 MG tablet, Take 1 tablet (5 mg total) by mouth at bedtime as needed for muscle spasms., Disp: 30 tablet, Rfl: 0   Docusate Sodium (DSS) 250 MG CAPS, Take 1 capsule by mouth as needed., Disp: , Rfl:    doxazosin (CARDURA) 4 MG tablet, Take 4 mg by mouth at bedtime. , Disp: , Rfl:     DROPLET PEN NEEDLES 32G X 4 MM MISC, , Disp: , Rfl:    EPINEPHrine 0.3 mg/0.3 mL IJ SOAJ injection, Inject 0.3 mg into the muscle as needed for anaphylaxis. , Disp: , Rfl: 3   ezetimibe (ZETIA) 10 MG tablet, TAKE 1 TABLET EVERY DAY, Disp: 90 tablet, Rfl: 3   Flaxseed, Linseed, (FLAXSEED OIL) 1200 MG CAPS, Take 1,200 mg by mouth daily., Disp: , Rfl:    fluticasone (FLONASE) 50 MCG/ACT nasal spray, Place 2 sprays into both nostrils daily as needed for allergies or rhinitis., Disp: , Rfl:    furosemide (LASIX) 80 MG tablet, TAKE 1 TABLET TWICE A DAY. MAY TAKE AN ADDITIONAL 80MG  (1 TABLET) AS NEEDED FOR EDEMA, Disp: 270 tablet, Rfl: 3   hydrALAZINE (APRESOLINE) 25 MG tablet, TAKE 1 TABLET TWICE DAILY, Disp: 180 tablet, Rfl: 3   isosorbide mononitrate (IMDUR) 60 MG 24 hr tablet, TAKE 1 TABLET EVERY DAY, Disp: 90 tablet, Rfl: 3   Ketotifen Fumarate (ALAWAY OP), Place 1 drop into both eyes daily as needed (allergies). , Disp: , Rfl:    LANTUS SOLOSTAR 100 UNIT/ML Solostar Pen, Inject 6 Units into the skin at bedtime., Disp: , Rfl:    metoprolol succinate (TOPROL-XL) 100 MG 24 hr tablet, TAKE 2 TABLETS EVERY DAY WITH OR IMMEDIATELY FOLLOWING A MEAL,  Disp: 180 tablet, Rfl: 3   Multiple Vitamin (MULTIVITAMIN WITH MINERALS) TABS, Take 1 tablet by mouth daily., Disp: , Rfl:    nitroGLYCERIN (NITROSTAT) 0.4 MG SL tablet, Place 1 tablet under the tongue every 5 minutes as needed for chest pain, max 3 doses, go to er if no relief, Disp: 75 tablet, Rfl: 2   olmesartan (BENICAR) 40 MG tablet, TAKE 1 TABLET EVERY DAY, Disp: 90 tablet, Rfl: 3   ondansetron (ZOFRAN) 8 MG tablet, Take 8 mg by mouth every 8 (eight) hours as needed for nausea or vomiting., Disp: , Rfl:    OZEMPIC, 0.25 OR 0.5 MG/DOSE, 2 MG/1.5ML SOPN, Inject 0.25 mg into the skin once a week. Sunday, Disp: , Rfl:    pantoprazole (PROTONIX) 40 MG tablet, TAKE 1 TABLET BY MOUTH  DAILY, Disp: 90 tablet, Rfl: 3   potassium chloride (KLOR-CON M) 10 MEQ tablet,  TAKE 2 TABLETS TWICE A DAY (KEEP MD APPOINTMENT FOR REFILLS), Disp: 360 tablet, Rfl: 3   simvastatin (ZOCOR) 40 MG tablet, TAKE 1 TABLET (40 MG TOTAL) BY MOUTH AT BEDTIME., Disp: 90 tablet, Rfl: 3   sodium chloride (MURO 128) 5 % ophthalmic ointment, Place 1 drop into both eyes at bedtime. For dry eyes, Disp: , Rfl:    traMADol (ULTRAM) 50 MG tablet, Take 100 mg by mouth every 6 (six) hours as needed (MIGRAINES)., Disp: , Rfl:    TRUE METRIX BLOOD GLUCOSE TEST test strip, , Disp: , Rfl:    TRUEplus Lancets 33G MISC, , Disp: , Rfl:    venlafaxine (EFFEXOR) 100 MG tablet, Take 100-200 mg by mouth See admin instructions. TAKE 200 mg by mouth in the morning and take 100 mg at noon, Disp: , Rfl:    vitamin A 7500 UNIT capsule, Take 2,400 Units by mouth daily., Disp: , Rfl:   Past Medical History: Past Medical History:  Diagnosis Date   AICD (automatic cardioverter/defibrillator) present    [employer]- Dr. Rosette Reveal follows   Anginal pain (HCC)    Anxiety    Asthma    Back pain    "pinched nerve-lower back" - Dr. Ethelene Hal follows.   Biventricular implantable cardioverter-defibrillator in situ 06/18/2011   Cerebral infarction (HCC) 11/11/2012   Cervical dysplasia    Chronic diastolic heart failure (HCC) 03/22/2016   Chronic kidney disease    Dr. Allena Katz follows.   Chronic renal insufficiency, stage III (moderate) (HCC) 11/11/2012   Complication of anesthesia    "I wake up during surgeries" (02/14/2013)   Coronary artery disease involving coronary bypass graft of native heart with angina pectoris (HCC) 06/18/2011   Depression    DM (diabetes mellitus) (HCC) 11/11/2012   Fibroid    Function kidney decreased    GERD (gastroesophageal reflux disease)    Hemiplegia, unspecified, affecting nondominant side 11/11/2012   Hepatitis    Hepatitis A -college yrs"water source exposure"   Hiatal hernia    History of shingles    2-3 yrs ago last out break "around waist"   History of stomach ulcers    Hyperlipidemia  07/30/2016   Hypertension    ICD (implantable cardiac defibrillator) in place    Iron deficiency anemia    Ischemic cardiomyopathy    status post biventricular ICD placed by DR Edumunds who used to see Dr Elsie Lincoln here to establish  cardiovascular care.   Migraines    MVP (mitral valve prolapse)    Antibiotics not required for procedures   Myocardial infarction (HCC)    "  I've had 2; the others they were able to catch before completing" (02/14/2013)   Pacemaker    Paroxysmal SVT (supraventricular tachycardia) 06/18/2011   Pneumonia 1950's & 1985   Shortness of breath    "lying down flat; at times w/exertion" (02/14/2013). 10-06-15 exertion only..   Stroke [facility])    "2 confirmed; 9 TIA's; results in dragging LLE; numbness in tip of tongue" (02/14/2013),10-06-15 right hand tends to be weaker when tired.   TIA (transient ischemic attack) 11/11/2012   Type II diabetes mellitus (HCC)     Tobacco Use: Social History   Tobacco Use  Smoking Status Never  Smokeless Tobacco Never    Labs: Review Flowsheet  More data exists      Latest Ref Rng & Units 01/03/2015 01/01/2016 01/02/2016 08/15/2018 04/06/2019  Labs for ITP Cardiac and Pulmonary Rehab  Cholestrol 100 - 199 mg/dL 782  - 956  213  086   LDL (calc) 0 - 99 mg/dL 70  - 66  72  92   HDL-C >39 mg/dL 54  - 41  49  61   Trlycerides 0 - 149 mg/dL 578  - 469  629  528   Hemoglobin A1c 4.8 - 5.6 % - - 6.9  - 6.5   TCO2 0 - 100 mmol/L - 25  - - -    Details            Capillary Blood Glucose: Lab Results  Component Value Date   GLUCAP 140 (H) 06/14/2023   GLUCAP 157 (H) 06/14/2023   GLUCAP 122 (H) 06/09/2023   GLUCAP 105 (H) 06/09/2023   GLUCAP 134 (H) 08/05/2018    POCT Glucose     Row Name 06/03/23 1337             POCT Blood Glucose   Pre-Exercise 128 mg/dL                Pulmonary Assessment Scores:  Pulmonary Assessment Scores     Row Name 06/03/23 1358         ADL UCSD   ADL Phase Entry     SOB Score total  47       CAT Score   CAT Score 18       mMRC Score   mMRC Score 3             UCSD: Self-administered rating of dyspnea associated with activities of daily living (ADLs) 6-point scale (0 = "not at all" to 5 = "maximal or unable to do because of breathlessness")  Scoring Scores range from 0 to 120.  Minimally important difference is 5 units  CAT: CAT can identify the health impairment of COPD patients and is better correlated with disease progression.  CAT has a scoring range of zero to 40. The CAT score is classified into four groups of low (less than 10), medium (10 - 20), high (21-30) and very high (31-40) based on the impact level of disease on health status. A CAT score over 10 suggests significant symptoms.  A worsening CAT score could be explained by an exacerbation, poor medication adherence, poor inhaler technique, or progression of COPD or comorbid conditions.  CAT MCID is 2 points  mMRC: mMRC (Modified Medical Research Council) Dyspnea Scale is used to assess the degree of baseline functional disability in patients of respiratory disease due to dyspnea. No minimal important difference is established. A decrease in score of 1 point or greater is considered a positive change.  Pulmonary Function Assessment:  Pulmonary Function Assessment - 06/03/23 1340       Breath   Bilateral Breath Sounds Clear    Shortness of Breath Yes             Exercise Target Goals: Exercise Program Goal: Individual exercise prescription set using results from initial 6 min walk test and THRR while considering  patient's activity barriers and safety.   Exercise Prescription Goal: Initial exercise prescription builds to 30-45 minutes a day of aerobic activity, 2-3 days per week.  Home exercise guidelines will be given to patient during program as part of exercise prescription that the participant will acknowledge.  Activity Barriers & Risk Stratification:  Activity Barriers & Cardiac  Risk Stratification - 06/03/23 1339       Activity Barriers & Cardiac Risk Stratification   Activity Barriers Deconditioning;Muscular Weakness;Shortness of Breath;Balance Concerns             6 Minute Walk:  6 Minute Walk     Row Name 06/03/23 1545         6 Minute Walk   Phase Initial     Distance 1172 feet     Walk Time 6 minutes     # of Rest Breaks 0     MPH 2.22     METS 2.87     RPE 13     Perceived Dyspnea  1.5     VO2 Peak 10.03     Symptoms No     Resting HR 80 bpm     Resting BP 122/64     Resting Oxygen Saturation  99 %     Exercise Oxygen Saturation  during 6 min walk 94 %     Max Ex. HR 93 bpm     Max Ex. BP 118/70     2 Minute Post BP 114/68       Interval HR   1 Minute HR 83     2 Minute HR 83     3 Minute HR 80     4 Minute HR 87     5 Minute HR 92     6 Minute HR 93     2 Minute Post HR 73     Interval Heart Rate? Yes       Interval Oxygen   Interval Oxygen? Yes     Baseline Oxygen Saturation % 99 %     1 Minute Oxygen Saturation % 97 %     1 Minute Liters of Oxygen 0 L     2 Minute Oxygen Saturation % 94 %     2 Minute Liters of Oxygen 0 L     3 Minute Oxygen Saturation % 100 %     3 Minute Liters of Oxygen 0 L     4 Minute Oxygen Saturation % 100 %     4 Minute Liters of Oxygen 0 L     5 Minute Oxygen Saturation % 99 %     5 Minute Liters of Oxygen 0 L     6 Minute Oxygen Saturation % 100 %     6 Minute Liters of Oxygen 0 L     2 Minute Post Oxygen Saturation % 100 %     2 Minute Post Liters of Oxygen 0 L              Oxygen Initial Assessment:  Oxygen Initial Assessment - 06/03/23 1340       Home  Oxygen   Home Oxygen Device None    Sleep Oxygen Prescription None    Home Exercise Oxygen Prescription None    Home Resting Oxygen Prescription None      Initial 6 min Walk   Oxygen Used None      Program Oxygen Prescription   Program Oxygen Prescription None      Intervention   Short Term Goals To learn and  understand importance of maintaining oxygen saturations>88%;To learn and demonstrate proper use of respiratory medications;To learn and understand importance of monitoring SPO2 with pulse oximeter and demonstrate accurate use of the pulse oximeter.;To learn and demonstrate proper pursed lip breathing techniques or other breathing techniques.     Long  Term Goals Maintenance of O2 saturations>88%;Compliance with respiratory medication;Verbalizes importance of monitoring SPO2 with pulse oximeter and return demonstration;Exhibits proper breathing techniques, such as pursed lip breathing or other method taught during program session;Demonstrates proper use of MDI's             Oxygen Re-Evaluation:  Oxygen Re-Evaluation     Row Name 06/09/23 0905 07/05/23 0926           Program Oxygen Prescription   Program Oxygen Prescription None None        Home Oxygen   Home Oxygen Device None None      Sleep Oxygen Prescription None None      Home Exercise Oxygen Prescription None None      Home Resting Oxygen Prescription None None        Goals/Expected Outcomes   Short Term Goals To learn and understand importance of maintaining oxygen saturations>88%;To learn and demonstrate proper use of respiratory medications;To learn and understand importance of monitoring SPO2 with pulse oximeter and demonstrate accurate use of the pulse oximeter.;To learn and demonstrate proper pursed lip breathing techniques or other breathing techniques.  To learn and understand importance of maintaining oxygen saturations>88%;To learn and demonstrate proper use of respiratory medications;To learn and understand importance of monitoring SPO2 with pulse oximeter and demonstrate accurate use of the pulse oximeter.;To learn and demonstrate proper pursed lip breathing techniques or other breathing techniques.       Long  Term Goals Maintenance of O2 saturations>88%;Compliance with respiratory medication;Verbalizes importance of  monitoring SPO2 with pulse oximeter and return demonstration;Exhibits proper breathing techniques, such as pursed lip breathing or other method taught during program session;Demonstrates proper use of MDI's Maintenance of O2 saturations>88%;Compliance with respiratory medication;Verbalizes importance of monitoring SPO2 with pulse oximeter and return demonstration;Exhibits proper breathing techniques, such as pursed lip breathing or other method taught during program session;Demonstrates proper use of MDI's      Goals/Expected Outcomes Compliance and understanding of oxygen saturation monitoring and breathing techniques to decrease shortness of breath Compliance and understanding of oxygen saturation monitoring and breathing techniques to decrease shortness of breath               Oxygen Discharge (Final Oxygen Re-Evaluation):  Oxygen Re-Evaluation - 07/05/23 0926       Program Oxygen Prescription   Program Oxygen Prescription None      Home Oxygen   Home Oxygen Device None    Sleep Oxygen Prescription None    Home Exercise Oxygen Prescription None    Home Resting Oxygen Prescription None      Goals/Expected Outcomes   Short Term Goals To learn and understand importance of maintaining oxygen saturations>88%;To learn and demonstrate proper use of respiratory medications;To learn and understand importance of monitoring SPO2 with pulse oximeter  and demonstrate accurate use of the pulse oximeter.;To learn and demonstrate proper pursed lip breathing techniques or other breathing techniques.     Long  Term Goals Maintenance of O2 saturations>88%;Compliance with respiratory medication;Verbalizes importance of monitoring SPO2 with pulse oximeter and return demonstration;Exhibits proper breathing techniques, such as pursed lip breathing or other method taught during program session;Demonstrates proper use of MDI's    Goals/Expected Outcomes Compliance and understanding of oxygen saturation monitoring  and breathing techniques to decrease shortness of breath             Initial Exercise Prescription:  Initial Exercise Prescription - 06/03/23 1400       Date of Initial Exercise RX and Referring Provider   Date 06/03/23    Referring Provider Allyson Sabal    Expected Discharge Date 09/01/23      Treadmill   MPH 2    Grade 0    Minutes 15    METs 2.53      Bike   Level 1    Watts 20    Minutes 15      Prescription Details   Frequency (times per week) 2    Duration Progress to 30 minutes of continuous aerobic without signs/symptoms of physical distress      Intensity   THRR 40-80% of Max Heartrate 60-121    Ratings of Perceived Exertion 11-13    Perceived Dyspnea 0-4      Progression   Progression Continue to progress workloads to maintain intensity without signs/symptoms of physical distress.      Resistance Training   Training Prescription Yes    Weight red bands    Reps 10-15             Perform Capillary Blood Glucose checks as needed.  Exercise Prescription Changes:   Exercise Prescription Changes     Row Name 06/14/23 1500 06/23/23 1450           Response to Exercise   Blood Pressure (Admit) 110/66 124/68      Blood Pressure (Exercise) 118/60 --      Blood Pressure (Exit) 108/58 120/60      Heart Rate (Admit) 77 bpm 83 bpm      Heart Rate (Exercise) 91 bpm 95 bpm      Heart Rate (Exit) 78 bpm 83 bpm      Oxygen Saturation (Admit) 98 % 99 %      Oxygen Saturation (Exercise) 98 % 98 %      Oxygen Saturation (Exit) 100 % 97 %      Rating of Perceived Exertion (Exercise) 12 13      Perceived Dyspnea (Exercise) 1 2      Duration Progress to 30 minutes of  aerobic without signs/symptoms of physical distress Progress to 30 minutes of  aerobic without signs/symptoms of physical distress      Intensity THRR unchanged THRR unchanged        Progression   Progression Continue to progress workloads to maintain intensity without signs/symptoms of physical  distress. Continue to progress workloads to maintain intensity without signs/symptoms of physical distress.        Resistance Training   Training Prescription Yes Yes      Weight red bands red bands      Reps 10-15 10-15      Time 10 Minutes 10 Minutes        Treadmill   MPH 2 2      Grade 1 0  Minutes 15 15      METs 2.81 2.53        Bike   Level 1 2      Minutes 15 15      METs 2.8 3.5               Exercise Comments:   Exercise Comments     Row Name 06/09/23 1549           Exercise Comments Pt completed first day of group exercise. Exercised on scifit bike for 15 min, level 1, 2.8 METs. She then walked the track for 15 min, speed 2.0, METs 2.9. Tolerated well. Performed warm up and cooldown with verbal cues. She c/o knee pain with squats.                Exercise Goals and Review:   Exercise Goals     Row Name 06/03/23 1339 06/09/23 0904 07/05/23 0922         Exercise Goals   Increase Physical Activity Yes Yes Yes     Intervention Provide advice, education, support and counseling about physical activity/exercise needs.;Develop an individualized exercise prescription for aerobic and resistive training based on initial evaluation findings, risk stratification, comorbidities and participant's personal goals. Provide advice, education, support and counseling about physical activity/exercise needs.;Develop an individualized exercise prescription for aerobic and resistive training based on initial evaluation findings, risk stratification, comorbidities and participant's personal goals. Provide advice, education, support and counseling about physical activity/exercise needs.;Develop an individualized exercise prescription for aerobic and resistive training based on initial evaluation findings, risk stratification, comorbidities and participant's personal goals.     Expected Outcomes Short Term: Attend rehab on a regular basis to increase amount of physical  activity.;Long Term: Exercising regularly at least 3-5 days a week.;Long Term: Add in home exercise to make exercise part of routine and to increase amount of physical activity. Short Term: Attend rehab on a regular basis to increase amount of physical activity.;Long Term: Exercising regularly at least 3-5 days a week.;Long Term: Add in home exercise to make exercise part of routine and to increase amount of physical activity. Short Term: Attend rehab on a regular basis to increase amount of physical activity.;Long Term: Exercising regularly at least 3-5 days a week.;Long Term: Add in home exercise to make exercise part of routine and to increase amount of physical activity.     Increase Strength and Stamina Yes Yes Yes     Intervention Provide advice, education, support and counseling about physical activity/exercise needs.;Develop an individualized exercise prescription for aerobic and resistive training based on initial evaluation findings, risk stratification, comorbidities and participant's personal goals. Provide advice, education, support and counseling about physical activity/exercise needs.;Develop an individualized exercise prescription for aerobic and resistive training based on initial evaluation findings, risk stratification, comorbidities and participant's personal goals. Provide advice, education, support and counseling about physical activity/exercise needs.;Develop an individualized exercise prescription for aerobic and resistive training based on initial evaluation findings, risk stratification, comorbidities and participant's personal goals.     Expected Outcomes Short Term: Increase workloads from initial exercise prescription for resistance, speed, and METs.;Short Term: Perform resistance training exercises routinely during rehab and add in resistance training at home;Long Term: Improve cardiorespiratory fitness, muscular endurance and strength as measured by increased METs and functional  capacity ( ) Short Term: Increase workloads from initial exercise prescription for resistance, speed, and METs.;Short Term: Perform resistance training exercises routinely during rehab and add in resistance training at home;Long Term: Improve cardiorespiratory fitness,  muscular endurance and strength as measured by increased METs and functional capacity ( ) Short Term: Increase workloads from initial exercise prescription for resistance, speed, and METs.;Short Term: Perform resistance training exercises routinely during rehab and add in resistance training at home;Long Term: Improve cardiorespiratory fitness, muscular endurance and strength as measured by increased METs and functional capacity ( )     Able to understand and use rate of perceived exertion (RPE) scale Yes Yes Yes     Intervention Provide education and explanation on how to use RPE scale Provide education and explanation on how to use RPE scale Provide education and explanation on how to use RPE scale     Expected Outcomes Short Term: Able to use RPE daily in rehab to express subjective intensity level;Long Term:  Able to use RPE to guide intensity level when exercising independently Short Term: Able to use RPE daily in rehab to express subjective intensity level;Long Term:  Able to use RPE to guide intensity level when exercising independently Short Term: Able to use RPE daily in rehab to express subjective intensity level;Long Term:  Able to use RPE to guide intensity level when exercising independently     Able to understand and use Dyspnea scale Yes Yes Yes     Intervention Provide education and explanation on how to use Dyspnea scale Provide education and explanation on how to use Dyspnea scale Provide education and explanation on how to use Dyspnea scale     Expected Outcomes Short Term: Able to use Dyspnea scale daily in rehab to express subjective sense of shortness of breath during exertion;Long Term: Able to use Dyspnea scale  to guide intensity level when exercising independently Short Term: Able to use Dyspnea scale daily in rehab to express subjective sense of shortness of breath during exertion;Long Term: Able to use Dyspnea scale to guide intensity level when exercising independently Short Term: Able to use Dyspnea scale daily in rehab to express subjective sense of shortness of breath during exertion;Long Term: Able to use Dyspnea scale to guide intensity level when exercising independently     Knowledge and understanding of Target Heart Rate Range (THRR) Yes Yes Yes     Intervention Provide education and explanation of THRR including how the numbers were predicted and where they are located for reference Provide education and explanation of THRR including how the numbers were predicted and where they are located for reference Provide education and explanation of THRR including how the numbers were predicted and where they are located for reference     Expected Outcomes Short Term: Able to state/look up THRR;Short Term: Able to use daily as guideline for intensity in rehab;Long Term: Able to use THRR to govern intensity when exercising independently Short Term: Able to state/look up THRR;Short Term: Able to use daily as guideline for intensity in rehab;Long Term: Able to use THRR to govern intensity when exercising independently Short Term: Able to state/look up THRR;Short Term: Able to use daily as guideline for intensity in rehab;Long Term: Able to use THRR to govern intensity when exercising independently     Understanding of Exercise Prescription Yes Yes Yes     Intervention Provide education, explanation, and written materials on patient's individual exercise prescription Provide education, explanation, and written materials on patient's individual exercise prescription Provide education, explanation, and written materials on patient's individual exercise prescription     Expected Outcomes Short Term: Able to explain  program exercise prescription;Long Term: Able to explain home exercise prescription to exercise independently Short Term:  Able to explain program exercise prescription;Long Term: Able to explain home exercise prescription to exercise independently Short Term: Able to explain program exercise prescription;Long Term: Able to explain home exercise prescription to exercise independently              Exercise Goals Re-Evaluation :  Exercise Goals Re-Evaluation     Row Name 06/09/23 0904 07/05/23 0922           Exercise Goal Re-Evaluation   Exercise Goals Review Increase Physical Activity;Able to understand and use Dyspnea scale;Understanding of Exercise Prescription;Increase Strength and Stamina;Knowledge and understanding of Target Heart Rate Range (THRR);Able to understand and use rate of perceived exertion (RPE) scale Increase Physical Activity;Able to understand and use Dyspnea scale;Understanding of Exercise Prescription;Increase Strength and Stamina;Knowledge and understanding of Target Heart Rate Range (THRR);Able to understand and use rate of perceived exertion (RPE) scale      Comments Pt to begin exercise 8/1. Will progress as tolerated Pt has completed 5 days of group exercise. She had good attendance until her daughter passed away recently. She has missed 2 sessions but plans to return in September. She is exercising on scifit bike for 15 min, level 2, 3.5 METs. She then walks on the treadmill for 15 min, speed 2.0, 0 incline, METs 2.53. Tolerating well, will progress as tolerated.      Expected Outcomes Through exercise at rehab and home, the patient will decrease shortness of breath with daily activities and feel confident in carrying out an exercise regimen at home Through exercise at rehab and home, the patient will decrease shortness of breath with daily activities and feel confident in carrying out an exercise regimen at home               Discharge Exercise Prescription  (Final Exercise Prescription Changes):  Exercise Prescription Changes - 06/23/23 1450       Response to Exercise   Blood Pressure (Admit) 124/68    Blood Pressure (Exit) 120/60    Heart Rate (Admit) 83 bpm    Heart Rate (Exercise) 95 bpm    Heart Rate (Exit) 83 bpm    Oxygen Saturation (Admit) 99 %    Oxygen Saturation (Exercise) 98 %    Oxygen Saturation (Exit) 97 %    Rating of Perceived Exertion (Exercise) 13    Perceived Dyspnea (Exercise) 2    Duration Progress to 30 minutes of  aerobic without signs/symptoms of physical distress    Intensity THRR unchanged      Progression   Progression Continue to progress workloads to maintain intensity without signs/symptoms of physical distress.      Resistance Training   Training Prescription Yes    Weight red bands    Reps 10-15    Time 10 Minutes      Treadmill   MPH 2    Grade 0    Minutes 15    METs 2.53      Bike   Level 2    Minutes 15    METs 3.5             Nutrition:  Target Goals: Understanding of nutrition guidelines, daily intake of sodium 1500mg , cholesterol 200mg , calories 30% from fat and 7% or less from saturated fats, daily to have 5 or more servings of fruits and vegetables.  Biometrics:  Pre Biometrics - 06/03/23 1334       Pre Biometrics   Grip Strength 13 kg  Nutrition Therapy Plan and Nutrition Goals:  Nutrition Therapy & Goals - 07/12/23 1454       Nutrition Therapy   Diet Renal Diet      Personal Nutrition Goals   Nutrition Goal Patient to identify strategies for weight maintenance/weight gain of 0.5-2.0# per week.   goal not met.   Comments Goal not met. Walta has attended 5 exercise session due to recent death of her daughter. Lielle reports following a vegetarian diet for >30 years; her primary protein sources are beans, dairy, etc. She is mindful of sodium. She is on ozempic for blood sugar control and recently stopped lantus; her A1c is 5.9. She does report  blood sugar lows 2-3x/week in the middle of the night. She continues to follow-up with [facility] for kidney transplant. She is motivated to maintain her weight; however, she is down 2.2# since starting with our program. Lency will continue to benefit from participation in pulmonary rehab for nutrition and exercise support.      Intervention Plan   Intervention Prescribe, educate and counsel regarding individualized specific dietary modifications aiming towards targeted core components such as weight, hypertension, lipid management, diabetes, heart failure and other comorbidities.;Nutrition handout(s) given to patient.    Expected Outcomes Short Term Goal: Understand basic principles of dietary content, such as calories, fat, sodium, cholesterol and nutrients.;Long Term Goal: Adherence to prescribed nutrition plan.             Nutrition Assessments:  Nutrition Assessments - 06/09/23 1436       Rate Your Plate Scores   Pre Score 74            MEDIFICTS Score Key: ?70 Need to make dietary changes  40-70 Heart Healthy Diet ? 40 Therapeutic Level Cholesterol Diet  Flowsheet Row PULMONARY REHAB CHRONIC OBSTRUCTIVE PULMONARY DISEASE from 06/09/2023 in [facility]  Picture Your Plate Total Score on Admission 74      Picture Your Plate Scores: <40 Unhealthy dietary pattern with much room for improvement. 41-50 Dietary pattern unlikely to meet recommendations for good health and room for improvement. 51-60 More healthful dietary pattern, with some room for improvement.  >60 Healthy dietary pattern, although there may be some specific behaviors that could be improved.    Nutrition Goals Re-Evaluation:  Nutrition Goals Re-Evaluation     Row Name 06/09/23 1431 07/12/23 1454           Goals   Current Weight 118 lb 2.7 oz (53.6 kg) 115 lb 15.4 oz (52.6 kg)      Comment Cr 3.1, GFR 16 Cr 3.1, GFR 16      Expected Outcome Mabry  reports following a vegetarian diet for >30 years; her primary protein sources are beans, dairy, etc. She is mindful of sodium. She is on ozempic for blood sugar control and recently stopped lantus; her A1c is 5.9. She does report blood sugar lows 2-3x/week in the middle of the night. She continues to follow-up with [facility] for kidney transplant. She is motivated to maintain her weight; she does not unwanted weight loss since starting Ozempic. Breeza will continue to benefit from participation in pulmonary rehab for nutrition and exercise support. Goal not met. Lucyanna has attended 5 exercise session due to recent death of her daughter. Khanh reports following a vegetarian diet for >30 years; her primary protein sources are beans, dairy, etc. She is mindful of sodium. She is on ozempic for blood sugar control and  recently stopped lantus; her A1c is 5.9. She does report blood sugar lows 2-3x/week in the middle of the night. She continues to follow-up with [facility] for kidney transplant. She is motivated to maintain her weight; however, she is down 2.2# since starting with our program. Jaiyda will continue to benefit from participation in pulmonary rehab for nutrition and exercise support.               Nutrition Goals Discharge (Final Nutrition Goals Re-Evaluation):  Nutrition Goals Re-Evaluation - 07/12/23 1454       Goals   Current Weight 115 lb 15.4 oz (52.6 kg)    Comment Cr 3.1, GFR 16    Expected Outcome Goal not met. Sonji has attended 5 exercise session due to recent death of her daughter. Nitika reports following a vegetarian diet for >30 years; her primary protein sources are beans, dairy, etc. She is mindful of sodium. She is on ozempic for blood sugar control and recently stopped lantus; her A1c is 5.9. She does report blood sugar lows 2-3x/week in the middle of the night. She continues to follow-up with [facility] for kidney transplant. She is motivated to maintain her weight; however, she is down  2.2# since starting with our program. Maclovia will continue to benefit from participation in pulmonary rehab for nutrition and exercise support.             Psychosocial: Target Goals: Acknowledge presence or absence of significant depression and/or stress, maximize coping skills, provide positive support system. Participant is able to verbalize types and ability to use techniques and skills needed for reducing stress and depression.  Initial Review & Psychosocial Screening:  Initial Psych Review & Screening - 06/03/23 1347       Initial Review   Current issues with Current Depression;History of Depression;Current Anxiety/Panic;Current Psychotropic Meds;Current Stress Concerns    Source of Stress Concerns Family;Chronic Illness    Comments Pt states she is worried about her husband who is currently residing in a skilled nursing facility. She stated he suffered a frontal lobe injury causing dementia. She also expressed his health is not good as he has suffered 2 heart attacks and strokes. She is also worried about her only child, her daughter, who she states is living on a LVAD and "isn't doing well". Mylah states her brother recently passed away from cardiac and kidney problems. She also worries about her cardiac health and is currently on the kidney transplant list.      Family Dynamics   Good Support System? Yes    Concerns Recent loss of significant other    Comments Fatimah states she has a small family but has supportive friends      Barriers   Psychosocial barriers to participate in program The patient should benefit from training in stress management and relaxation.;Psychosocial barriers identified (see note)      Screening Interventions   Interventions Encouraged to exercise;To provide support and resources with identified psychosocial needs;Provide feedback about the scores to participant   Yoneko stated she is currently on psychotropic meds and sees a therapist regularly. She  declines any needs at the time.   Expected Outcomes Short Term goal: Utilizing psychosocial counselor, staff and physician to assist with identification of specific Stressors or current issues interfering with healing process. Setting desired goal for each stressor or current issue identified.;Long Term Goal: Stressors or current issues are controlled or eliminated.;Short Term goal: Identification and review with participant of any Quality of Life or Depression concerns found  by scoring the questionnaire.;Long Term goal: The participant improves quality of Life and PHQ9 Scores as seen by post scores and/or verbalization of changes             Quality of Life Scores:  Scores of 19 and below usually indicate a poorer quality of life in these areas.  A difference of  2-3 points is a clinically meaningful difference.  A difference of 2-3 points in the total score of the Quality of Life Index has been associated with significant improvement in overall quality of life, self-image, physical symptoms, and general health in studies assessing change in quality of life.  PHQ-9: Review Flowsheet       06/03/2023  Depression screen PHQ 2/9  Decreased Interest 2  Down, Depressed, Hopeless 2  PHQ - 2 Score 4  Altered sleeping 1  Tired, decreased energy 2  Change in appetite 3  Feeling bad or failure about yourself  1  Trouble concentrating 1  Moving slowly or fidgety/restless 3  Suicidal thoughts 0  PHQ-9 Score 15  Difficult doing work/chores Somewhat difficult    Details           Interpretation of Total Score  Total Score Depression Severity:  1-4 = Minimal depression, 5-9 = Mild depression, 10-14 = Moderate depression, 15-19 = Moderately severe depression, 20-27 = Severe depression   Psychosocial Evaluation and Intervention:  Psychosocial Evaluation - 06/03/23 1352       Psychosocial Evaluation & Interventions   Interventions Encouraged to exercise with the program and follow  exercise prescription;Relaxation education;Stress management education    Comments Krist stated she is currently on psychotropic meds and sees a therapist regularly. She declines any needs at the time.    Expected Outcomes For Nevah to attend pulmonary rehab and to have a positive outlook and good coping skills to manage her stress. For Aarushi to be compliant in taking her psychotropic meds and continue seeing her therapist regularly.    Continue Psychosocial Services  Follow up required by staff             Psychosocial Re-Evaluation:  Psychosocial Re-Evaluation     Row Name 06/10/23 1150 07/06/23 1512           Psychosocial Re-Evaluation   Current issues with Current Stress Concerns;Current Psychotropic Meds;Current Anxiety/Panic;History of Depression;Current Depression Current Stress Concerns;Current Psychotropic Meds;Current Anxiety/Panic;History of Depression;Current Depression      Comments No changes since Margurete has started the program. Correne has completed 1 class so far. Sebrena has been through a lot recently. She tragically lost her daughter unexpectedly. She has been out of class the last 2 weeks. Ahmoni called and stated she would be back to class after all funeral arrangements have been made and they lay her to rest. Takenya is also concerned about her husband who resides in assisted living, and her own health. Shaquelle has a small dog who is her companion and gives her emotional support. She also has close friends she relies on. Alecea denies any needs currently. We will continue to monitor [facility] and assess for any needs throughout the program.      Expected Outcomes For Taylar to participate in PR without any psychosocial barriers or concerns. For Caelie to participate in PR without any psychosocial barriers or concerns. For Katelan to continue to attend pulmonary rehab and to have a positive outlook and good coping skills to manage her stress & anxiety, and decreased symptoms of  depression.  Interventions Encouraged to attend Pulmonary Rehabilitation for the exercise;Stress management education;Relaxation education Encouraged to attend Pulmonary Rehabilitation for the exercise;Stress management education;Relaxation education      Continue Psychosocial Services  Follow up required by staff Follow up required by staff               Psychosocial Discharge (Final Psychosocial Re-Evaluation):  Psychosocial Re-Evaluation - 07/06/23 1512       Psychosocial Re-Evaluation   Current issues with Current Stress Concerns;Current Psychotropic Meds;Current Anxiety/Panic;History of Depression;Current Depression    Comments Bentli has been through a lot recently. She tragically lost her daughter unexpectedly. She has been out of class the last 2 weeks. Jaquila called and stated she would be back to class after all funeral arrangements have been made and they lay her to rest. Natessa is also concerned about her husband who resides in assisted living, and her own health. Lillyannah has a small dog who is her companion and gives her emotional support. She also has close friends she relies on. Andromeda denies any needs currently. We will continue to monitor [place] and assess for any needs throughout the program.    Expected Outcomes For Farran to participate in PR without any psychosocial barriers or concerns. For Noha to continue to attend pulmonary rehab and to have a positive outlook and good coping skills to manage her stress & anxiety, and decreased symptoms of depression.    Interventions Encouraged to attend Pulmonary Rehabilitation for the exercise;Stress management education;Relaxation education    Continue Psychosocial Services  Follow up required by staff             Education: Education Goals: Education classes will be provided on a weekly basis, covering required topics. Participant will state understanding/return demonstration of topics presented.  Learning  Barriers/Preferences:  Learning Barriers/Preferences - 06/03/23 1542       Learning Barriers/Preferences   Learning Barriers None    Learning Preferences Group Instruction;Individual Instruction;Verbal Instruction;Written Material             Education Topics: Introduction to Pulmonary Rehab Group instruction provided by PowerPoint, verbal discussion, and written material to support subject matter. Instructor reviews what Pulmonary Rehab is, the purpose of the program, and how patients are referred.     Know Your Numbers Group instruction that is supported by a PowerPoint presentation. Instructor discusses importance of knowing and understanding resting, exercise, and post-exercise oxygen saturation, heart rate, and blood pressure. Oxygen saturation, heart rate, blood pressure, rating of perceived exertion, and dyspnea are reviewed along with a normal range for these values.    Exercise for the Pulmonary Patient Group instruction that is supported by a PowerPoint presentation. Instructor discusses benefits of exercise, core components of exercise, frequency, duration, and intensity of an exercise routine, importance of utilizing pulse oximetry during exercise, safety while exercising, and options of places to exercise outside of rehab.       MET Level  Group instruction provided by PowerPoint, verbal discussion, and written material to support subject matter. Instructor reviews what METs are and how to increase METs.    Pulmonary Medications Verbally interactive group education provided by instructor with focus on inhaled medications and proper administration.   Anatomy and Physiology of the Respiratory System Group instruction provided by PowerPoint, verbal discussion, and written material to support subject matter. Instructor reviews respiratory cycle and anatomical components of the respiratory system and their functions. Instructor also reviews differences in obstructive  and restrictive respiratory diseases with examples of each.  Oxygen Safety Group instruction provided by PowerPoint, verbal discussion, and written material to support subject matter. There is an overview of "What is Oxygen" and "Why do we need it".  Instructor also reviews how to create a safe environment for oxygen use, the importance of using oxygen as prescribed, and the risks of noncompliance. There is a brief discussion on traveling with oxygen and resources the patient may utilize.   Oxygen Use Group instruction provided by PowerPoint, verbal discussion, and written material to discuss how supplemental oxygen is prescribed and different types of oxygen supply systems. Resources for more information are provided.    Breathing Techniques Group instruction that is supported by demonstration and informational handouts. Instructor discusses the benefits of pursed lip and diaphragmatic breathing and detailed demonstration on how to perform both.     Risk Factor Reduction Group instruction that is supported by a PowerPoint presentation. Instructor discusses the definition of a risk factor, different risk factors for pulmonary disease, and how the heart and lungs work together.   MD Day A group question and answer session with a medical doctor that allows participants to ask questions that relate to their pulmonary disease state.   Nutrition for the Pulmonary Patient Group instruction provided by PowerPoint slides, verbal discussion, and written materials to support subject matter. The instructor gives an explanation and review of healthy diet recommendations, which includes a discussion on weight management, recommendations for fruit and vegetable consumption, as well as protein, fluid, caffeine, fiber, sodium, sugar, and alcohol. Tips for eating when patients are short of breath are discussed.    Other Education Group or individual verbal, written, or video instructions that support  the educational goals of the pulmonary rehab program. Flowsheet Row PULMONARY REHAB OTHER RESPIRATORY from 06/23/2023 in [facility]  Date 06/23/23  Educator RN  Instruction Review Code 1- Verbalizes Understanding        Knowledge Questionnaire Score:  Knowledge Questionnaire Score - 06/03/23 1530       Knowledge Questionnaire Score   Pre Score 16/18             Core Components/Risk Factors/Patient Goals at Admission:  Personal Goals and Risk Factors at Admission - 06/03/23 1343       Core Components/Risk Factors/Patient Goals on Admission    Weight Management Yes;Weight Maintenance    Admit Weight 118 lb 2.7 oz (53.6 kg)    Improve shortness of breath with ADL's Yes    Intervention Provide education, individualized exercise plan and daily activity instruction to help decrease symptoms of SOB with activities of daily living.    Expected Outcomes Short Term: Improve cardiorespiratory fitness to achieve a reduction of symptoms when performing ADLs;Long Term: Be able to perform more ADLs without symptoms or delay the onset of symptoms    Increase knowledge of respiratory medications and ability to use respiratory devices properly  Yes    Intervention Provide education and demonstration as needed of appropriate use of medications, inhalers, and oxygen therapy.    Expected Outcomes Short Term: Achieves understanding of medications use. Understands that oxygen is a medication prescribed by physician. Demonstrates appropriate use of inhaler and oxygen therapy.;Long Term: Maintain appropriate use of medications, inhalers, and oxygen therapy.    Stress Yes    Intervention Offer individual and/or small group education and counseling on adjustment to heart disease, stress management and health-related lifestyle change. Teach and support self-help strategies.;Refer participants experiencing significant psychosocial distress to  appropriate mental health specialists for further evaluation and treatment. When possible, include family members and significant others in education/counseling sessions.    Expected Outcomes Short Term: Participant demonstrates changes in health-related behavior, relaxation and other stress management skills, ability to obtain effective social support, and compliance with psychotropic medications if prescribed.;Long Term: Emotional wellbeing is indicated by absence of clinically significant psychosocial distress or social isolation.             Core Components/Risk Factors/Patient Goals Review:   Goals and Risk Factor Review     Row Name 06/10/23 1152 07/06/23 1517           Core Components/Risk Factors/Patient Goals Review   Personal Goals Review Weight Management/Obesity;Improve shortness of breath with ADL's;Develop more efficient breathing techniques such as purse lipped breathing and diaphragmatic breathing and practicing self-pacing with activity.;Stress;Increase knowledge of respiratory medications and ability to use respiratory devices properly. Weight Management/Obesity;Improve shortness of breath with ADL's;Develop more efficient breathing techniques such as purse lipped breathing and diaphragmatic breathing and practicing self-pacing with activity.;Stress;Increase knowledge of respiratory medications and ability to use respiratory devices properly.      Review Unable to assess progress. Orville has completed 1 class so far. Kelcey's goal of weight management is progressing. She is currently down ~2# since starting the program. She continues to work with our dietician to make healthy choices, increasing fruits and vegetables and reducing fats. Goal in progress on developing more efficient breathing techniques such as purse lipped breathing and diaphragmatic breathing; and practicing self-pacing with activity. We have been working & teaching Carney Bern on how to perform PLB and pacing herself  while walking on the treadmill. Goal in progress on improving her shortness of breath with ADLs. Samaiya has maintained her oxygen saturation >88% while exercising on room air. Goal progressing for increasing her knowledge of respiratory medications and ability to use respiratory devices properly. She is working with our respiratory therapist on how to correctly and properly use her inhalers. Goal progressing for decreased stress. Zakyia has been out for the last 2 weeks due to the unexpected loss of her daughter. We will work with her on stress relief through meditation and diaphragmatic breathing techniques. We will also be monitoring her and assessing her for any professional mental health resources and/or referrals. She will continue to benefit from PR for nutrition, education, exercise, and lifestyle modification.      Expected Outcomes See admission goals For Rafael to maintain her weight, to improve her shortness of breath with ADLs, develop more efficient breathing techniques, increase her knowledge of respiratory meds, and decrease her stress & anxiety.               Core Components/Risk Factors/Patient Goals at Discharge (Final Review):   Goals and Risk Factor Review - 07/06/23 1517       Core Components/Risk Factors/Patient Goals Review   Personal Goals Review Weight Management/Obesity;Improve shortness of breath with ADL's;Develop more efficient breathing techniques such as purse lipped breathing and diaphragmatic breathing and practicing self-pacing with activity.;Stress;Increase knowledge of respiratory medications and ability to use respiratory devices properly.    Review Kinleigh's goal of weight management is progressing. She is currently down ~2# since starting the program. She continues to work with our dietician to make healthy choices, increasing fruits and vegetables and reducing fats. Goal in progress on developing more efficient breathing techniques such as purse lipped breathing  and diaphragmatic breathing; and practicing self-pacing with activity. We have been working & teaching Carney Bern on  how to perform PLB and pacing herself while walking on the treadmill. Goal in progress on improving her shortness of breath with ADLs. Shalika has maintained her oxygen saturation >88% while exercising on room air. Goal progressing for increasing her knowledge of respiratory medications and ability to use respiratory devices properly. She is working with our respiratory therapist on how to correctly and properly use her inhalers. Goal progressing for decreased stress. Madelline has been out for the last 2 weeks due to the unexpected loss of her daughter. We will work with her on stress relief through meditation and diaphragmatic breathing techniques. We will also be monitoring her and assessing her for any professional mental health resources and/or referrals. She will continue to benefit from PR for nutrition, education, exercise, and lifestyle modification.    Expected Outcomes For Victory to maintain her weight, to improve her shortness of breath with ADLs, develop more efficient breathing techniques, increase her knowledge of respiratory meds, and decrease her stress & anxiety.             ITP Comments:Pt is making expected progress toward Pulmonary Rehab goals after completing 5 sessions. Recommend continued exercise, life style modification, education, and utilization of breathing techniques to increase stamina and strength, while also decreasing shortness of breath with exertion.  Dr. Mechele Collin is Medical Director for Pulmonary Rehab at [facility].     Comments: Dr. Mechele Collin is Medical Director for Pulmonary Rehab at [facility].

## 2023-07-13 NOTE — Addendum Note (Signed)
Encounter addended by: Guss Bunde, RRT on: 07/13/2023 12:06 PM  Actions taken: Clinical Note Signed

## 2023-07-14 ENCOUNTER — Encounter (HOSPITAL_COMMUNITY): Payer: Medicare HMO

## 2023-07-14 ENCOUNTER — Telehealth: Payer: Self-pay

## 2023-07-14 NOTE — Telephone Encounter (Signed)
Renal coordinator attempted to call patient on today regarding [facility] referral. Successful outreach made today with patient. Patient shared she is currently grieving the unexpected lost of her daughter. Coordinator completely understood and shared over the phone program and will mail additional information if there are any [facility] resources needed at a later date.  Note will be sent to clinical lead at [facility].   Baruch Gouty Renal Coordinator [facility] Population [facility] [phone]

## 2023-07-19 ENCOUNTER — Encounter (HOSPITAL_COMMUNITY)
Admission: RE | Admit: 2023-07-19 | Discharge: 2023-07-19 | Disposition: A | Discharge status: Home / Self Care | Payer: Medicare HMO | Source: Ambulatory Visit | Attending: Cardiovascular Disease | Admitting: Cardiovascular Disease

## 2023-07-19 ENCOUNTER — Encounter (HOSPITAL_COMMUNITY): Payer: Medicare HMO

## 2023-07-19 DIAGNOSIS — I5032 Chronic diastolic (congestive) heart failure: Secondary | ICD-10-CM | POA: Diagnosis not present

## 2023-07-19 NOTE — Progress Notes (Signed)
Daily Session Note  Patient Details  Name: [name] MRN: [id number] Date of Birth: 1951-10-06 Referring Provider:   Doristine Devoid Pulmonary Rehab Walk Test from 06/03/2023 in [facility] for Heart, Vascular, & Lung Health  Referring Provider Allyson Sabal       Encounter Date: 07/19/2023  Check In:  Session Check In - 07/19/23 1500       Check-In   Supervising physician immediately available to respond to emergencies [facility] MD immediately available    Physician(s) Joni Reining, NP    Location [facility]    Staff Present Raford Pitcher, MS, ACSM-CEP, Exercise Physiologist;Randi Dionisio Paschal, ACSM-CEP, Exercise Physiologist;Samantha Belarus, RD, Dutch Gray, RN, BSN;Casey Smith, Donnald Garre, RN, BSN    Virtual Visit No    Medication changes reported     No    Fall or balance concerns reported    No    Tobacco Cessation No Change    Warm-up and Cool-down Performed as group-led Writer Performed Yes    VAD Patient? No    PAD/SET Patient? No      Pain Assessment   Currently in Pain? No/denies    Multiple Pain Sites No             Capillary Blood Glucose: No results found for this or any previous visit (from the past 24 hour(s)).    Social History   Tobacco Use  Smoking Status Never  Smokeless Tobacco Never    Goals Met:  Proper associated with RPD/PD & O2 Sat Independence with exercise equipment Exercise tolerated well No report of concerns or symptoms today Strength training completed today  Goals Unmet:  Not Applicable  Comments: Service time is from 1329 to 1443.    Dr. Mechele Collin is Medical Director for Pulmonary Rehab at [facility].

## 2023-07-21 ENCOUNTER — Encounter (HOSPITAL_COMMUNITY): Payer: Medicare HMO

## 2023-07-21 ENCOUNTER — Encounter (HOSPITAL_COMMUNITY)
Admission: RE | Admit: 2023-07-21 | Discharge: 2023-07-21 | Disposition: A | Discharge status: Home / Self Care | Payer: Medicare HMO | Source: Ambulatory Visit | Attending: Cardiovascular Disease | Admitting: Cardiovascular Disease

## 2023-07-21 DIAGNOSIS — I5032 Chronic diastolic (congestive) heart failure: Secondary | ICD-10-CM | POA: Diagnosis not present

## 2023-07-21 NOTE — Progress Notes (Signed)
Daily Session Note  Patient Details  Name: [name] MRN: [id number] Date of Birth: 15-Jul-1951 Referring Provider:   Doristine Devoid Pulmonary Rehab Walk Test from 06/03/2023 in [facility] for Heart, Vascular, & Lung Health  Referring Provider Allyson Sabal       Encounter Date: 07/21/2023  Check In:  Session Check In - 07/21/23 1340       Check-In   Supervising physician immediately available to respond to emergencies [facility] immediately available    Physician(s) Bernadene Person, NP    [facility]    Staff Present Raford Pitcher, MS, ACSM-CEP, Exercise Physiologist;Randi Dionisio Paschal, ACSM-CEP, Exercise Physiologist;Samantha Belarus, RD, Dutch Gray, RN, BSN;Chanell Nadeau, Donnald Garre, RN, BSN    Virtual Visit No    Medication changes reported     No    Fall or balance concerns reported    No    Tobacco Cessation No Change    Warm-up and Cool-down Performed as group-led Writer Performed Yes    VAD Patient? No    PAD/SET Patient? No      Pain Assessment   Currently in Pain? No/denies    Multiple Pain Sites No             Capillary Blood Glucose: No results found for this or any previous visit (from the past 24 hour(s)).    Social History   Tobacco Use  Smoking Status Never  Smokeless Tobacco Never    Goals Met:  Proper associated with RPD/PD & O2 Sat Independence with exercise equipment Exercise tolerated well No report of concerns or symptoms today Strength training completed today  Goals Unmet:  Not Applicable  Comments: Service time is from 1319 to 1445.    Dr. Mechele Collin is Medical Director for Pulmonary Rehab at [facility].

## 2023-07-26 ENCOUNTER — Encounter (HOSPITAL_COMMUNITY)
Admission: RE | Admit: 2023-07-26 | Discharge: 2023-07-26 | Disposition: A | Discharge status: Home / Self Care | Payer: Medicare HMO | Source: Ambulatory Visit | Attending: Cardiovascular Disease

## 2023-07-26 ENCOUNTER — Encounter (HOSPITAL_COMMUNITY): Payer: Medicare HMO

## 2023-07-26 VITALS — Wt 116.6 lb

## 2023-07-26 DIAGNOSIS — I5032 Chronic diastolic (congestive) heart failure: Secondary | ICD-10-CM

## 2023-07-26 NOTE — Progress Notes (Signed)
Home Exercise Prescription I have reviewed a Home Exercise Prescription with [name]. She is currently doing resistance bands x1 a week. Discussed beginning to walk in her basement or outside 15 min x2 or 30 min, 1-2 non rehab days a week. She is agreeable. She will also look in to [employer] program at the [facility] in October. The patient stated that their goals were to increase her hands strength and we discussed exercises. We reviewed exercise guidelines, target heart rate during exercise, RPE Scale, weather conditions, endpoints for exercise, warmup and cool down. The patient is encouraged to come to me with any questions. I will continue to follow up with the patient to assist them with progression and safety.  Spent 15 min discussing home exercise plan and goals.  [name] [place], [place], ACSM-CEP 07/26/2023 3:43 PM

## 2023-07-26 NOTE — Progress Notes (Signed)
Daily Session Note  Patient Details  Name: [name] MRN: [id number] Date of Birth: 06/02/1951 Referring Provider:   Doristine Devoid Pulmonary Rehab Walk Test from 06/03/2023 in [facility] for Heart, Vascular, & Lung Health  Referring Provider [name]       Encounter Date: 07/26/2023  Check In:  Session Check In - 07/26/23 1349       Check-In   Supervising physician immediately available to respond to emergencies [facility] MD immediately available    Physician(s) Emily Favre, NP    [facility]    Staff Present Emily Pitcher, MS, ACSM-CEP, Exercise Physiologist;[name], ACSM-CEP, Exercise Physiologist;[name], RD, Emily Gray, RN, [name], RT    Virtual Visit No    Medication changes reported     No    Fall or balance concerns reported    No    Tobacco Cessation No Change    Warm-up and Cool-down Performed as group-led instruction    Resistance Training Performed Yes    VAD Patient? No    PAD/SET Patient? No      Pain Assessment   Currently in Pain? No/denies    Multiple Pain Sites No             Capillary Blood Glucose: No results found for this or any previous visit (from the past 24 hour(s)).   Exercise Prescription Changes - 07/26/23 1500       Response to Exercise   Blood Pressure (Admit) 140/64    Blood Pressure (Exercise) 130/64    Blood Pressure (Exit) 114/66    Heart Rate (Admit) 71 bpm    Heart Rate (Exercise) 96 bpm    Heart Rate (Exit) 72 bpm    Oxygen Saturation (Admit) 98 %    Oxygen Saturation (Exercise) 98 %    Oxygen Saturation (Exit) 100 %    Rating of Perceived Exertion (Exercise) 11    Perceived Dyspnea (Exercise) 1    Duration Continue with 30 min of aerobic exercise without signs/symptoms of physical distress.    Intensity THRR unchanged      Progression   Progression Continue to progress workloads to maintain intensity without signs/symptoms of physical  distress.      Resistance Training   Training Prescription Yes    Weight blue bands    Reps 10-15    Time 10 Minutes      Treadmill   MPH 2.5    Grade 1    Minutes 15    METs 3.26      Bike   Level 4    Minutes 15    METs 3.8             Social History   Tobacco Use  Smoking Status Never  Smokeless Tobacco Never    Goals Met:  Proper associated with RPD/PD & O2 Sat Independence with exercise equipment Exercise tolerated well No report of concerns or symptoms today Strength training completed today  Goals Unmet:  Not Applicable  Comments: Service time is from 1324 to 1437.    [name] is Medical Director for Pulmonary Rehab at [facility].

## 2023-07-28 ENCOUNTER — Encounter (HOSPITAL_COMMUNITY)
Admission: RE | Admit: 2023-07-28 | Discharge: 2023-07-28 | Disposition: A | Discharge status: Home / Self Care | Payer: Medicare HMO | Source: Ambulatory Visit | Attending: Cardiovascular Disease | Admitting: Cardiovascular Disease

## 2023-07-28 ENCOUNTER — Encounter (HOSPITAL_COMMUNITY): Payer: Medicare HMO

## 2023-07-28 DIAGNOSIS — I5032 Chronic diastolic (congestive) heart failure: Secondary | ICD-10-CM | POA: Diagnosis not present

## 2023-07-28 NOTE — Progress Notes (Signed)
Daily Session Note  Patient Details  Name: [name] MRN: [id number] Date of Birth: 1951/03/16 Referring Provider:   Doristine Devoid Pulmonary Rehab Walk Test from 06/03/2023 in [facility] for Heart, Vascular, & Lung Health  Referring Provider Allyson Sabal       Encounter Date: 07/28/2023  Check In:  Session Check In - 07/28/23 1429       Check-In   Supervising physician immediately available to respond to emergencies [facility] immediately available    Physician(s) Jari Favre, NP    [facility]    Staff Present Raford Pitcher, MS, ACSM-CEP, Exercise Physiologist;Randi Dionisio Paschal, ACSM-CEP, Exercise Physiologist;Mary Gerre Scull, RN, [name] Plan, RT    Virtual Visit No    Medication changes reported     No    Fall or balance concerns reported    No    Tobacco Cessation No Change    Warm-up and Cool-down Performed as group-led instruction    Resistance Training Performed Yes    VAD Patient? No    PAD/SET Patient? No      Pain Assessment   Currently in Pain? No/denies    Multiple Pain Sites No             Capillary Blood Glucose: No results found for this or any previous visit (from the past 24 hour(s)).    Social History   Tobacco Use  Smoking Status Never  Smokeless Tobacco Never    Goals Met:  Proper associated with RPD/PD & O2 Sat Independence with exercise equipment Exercise tolerated well No report of concerns or symptoms today Strength training completed today  Goals Unmet:  Not Applicable  Comments: Service time is from 1331 to 1442.    Dr. Mechele Collin is Medical Director for Pulmonary Rehab at [facility].

## 2023-08-02 ENCOUNTER — Encounter (HOSPITAL_COMMUNITY): Payer: Medicare HMO

## 2023-08-02 ENCOUNTER — Encounter (HOSPITAL_COMMUNITY)
Admission: RE | Admit: 2023-08-02 | Discharge: 2023-08-02 | Disposition: A | Discharge status: Home / Self Care | Payer: Medicare HMO | Source: Ambulatory Visit | Attending: Cardiovascular Disease | Admitting: Cardiovascular Disease

## 2023-08-02 DIAGNOSIS — I5032 Chronic diastolic (congestive) heart failure: Secondary | ICD-10-CM | POA: Diagnosis not present

## 2023-08-02 DIAGNOSIS — N184 Chronic kidney disease, stage 4 (severe): Secondary | ICD-10-CM | POA: Diagnosis not present

## 2023-08-02 NOTE — Progress Notes (Signed)
Daily Session Note  Patient Details  Name: [name] MRN: [id number] Date of Birth: Aug 22, 1951 Referring Provider:   Doristine Devoid Pulmonary Rehab Walk Test from 06/03/2023 in [facility] for Heart, Vascular, & Lung Health  Referring Provider Allyson Sabal       Encounter Date: 08/02/2023  Check In:  Session Check In - 08/02/23 1331       Check-In   Supervising physician immediately available to respond to emergencies [facility] immediately available    Physician(s) Carlos Levering, NP    [facility]    Staff Present Raford Pitcher, MS, ACSM-CEP, Exercise Physiologist;Randi Dionisio Paschal, ACSM-CEP, Exercise Physiologist;Mary Gerre Scull, RN, BSN;Nadina Fomby, RT;Samantha Belarus, RD, LDN    Virtual Visit No    Medication changes reported     No    Fall or balance concerns reported    No    Tobacco Cessation No Change    Warm-up and Cool-down Performed as group-led instruction    Resistance Training Performed Yes    VAD Patient? No    PAD/SET Patient? No      Pain Assessment   Currently in Pain? No/denies    Multiple Pain Sites No             Capillary Blood Glucose: No results found for this or any previous visit (from the past 24 hour(s)).    Social History   Tobacco Use  Smoking Status Never  Smokeless Tobacco Never    Goals Met:  Proper associated with RPD/PD & O2 Sat Independence with exercise equipment Exercise tolerated well No report of concerns or symptoms today Strength training completed today  Goals Unmet:  Not Applicable  Comments: Service time is from 1327 to 1440.    Dr. Mechele Collin is Medical Director for Pulmonary Rehab at [facility].

## 2023-08-03 DIAGNOSIS — I679 Cerebrovascular disease, unspecified: Secondary | ICD-10-CM | POA: Diagnosis not present

## 2023-08-03 DIAGNOSIS — F439 Reaction to severe stress, unspecified: Secondary | ICD-10-CM | POA: Diagnosis not present

## 2023-08-03 DIAGNOSIS — Z1331 Encounter for screening for depression: Secondary | ICD-10-CM | POA: Diagnosis not present

## 2023-08-03 DIAGNOSIS — E1151 Type 2 diabetes mellitus with diabetic peripheral angiopathy without gangrene: Secondary | ICD-10-CM | POA: Diagnosis not present

## 2023-08-03 DIAGNOSIS — I10 Essential (primary) hypertension: Secondary | ICD-10-CM | POA: Diagnosis not present

## 2023-08-03 DIAGNOSIS — N184 Chronic kidney disease, stage 4 (severe): Secondary | ICD-10-CM | POA: Diagnosis not present

## 2023-08-03 DIAGNOSIS — I5022 Chronic systolic (congestive) heart failure: Secondary | ICD-10-CM | POA: Diagnosis not present

## 2023-08-03 DIAGNOSIS — I2510 Atherosclerotic heart disease of native coronary artery without angina pectoris: Secondary | ICD-10-CM | POA: Diagnosis not present

## 2023-08-03 DIAGNOSIS — Z0000 Encounter for general adult medical examination without abnormal findings: Secondary | ICD-10-CM | POA: Diagnosis not present

## 2023-08-04 ENCOUNTER — Encounter (HOSPITAL_COMMUNITY)
Admission: RE | Admit: 2023-08-04 | Discharge: 2023-08-04 | Disposition: A | Discharge status: Home / Self Care | Payer: Medicare HMO | Source: Ambulatory Visit | Attending: Cardiovascular Disease | Admitting: Cardiovascular Disease

## 2023-08-04 ENCOUNTER — Encounter (HOSPITAL_COMMUNITY): Payer: Medicare HMO

## 2023-08-04 DIAGNOSIS — I129 Hypertensive chronic kidney disease with stage 1 through stage 4 chronic kidney disease, or unspecified chronic kidney disease: Secondary | ICD-10-CM | POA: Diagnosis not present

## 2023-08-04 DIAGNOSIS — N189 Chronic kidney disease, unspecified: Secondary | ICD-10-CM | POA: Diagnosis not present

## 2023-08-04 DIAGNOSIS — N184 Chronic kidney disease, stage 4 (severe): Secondary | ICD-10-CM | POA: Diagnosis not present

## 2023-08-04 DIAGNOSIS — I5032 Chronic diastolic (congestive) heart failure: Secondary | ICD-10-CM

## 2023-08-04 DIAGNOSIS — D631 Anemia in chronic kidney disease: Secondary | ICD-10-CM | POA: Diagnosis not present

## 2023-08-04 DIAGNOSIS — N2581 Secondary hyperparathyroidism of renal origin: Secondary | ICD-10-CM | POA: Diagnosis not present

## 2023-08-04 NOTE — Progress Notes (Signed)
Daily Session Note  Patient Details  Name: [name] MRN: [id number] Date of Birth: 08-08-1951 Referring Provider:   Doristine Devoid Pulmonary Rehab Walk Test from 06/03/2023 in [facility] for Heart, Vascular, & Lung Health  Referring Provider Allyson Sabal       Encounter Date: 08/04/2023  Check In:  Session Check In - 08/04/23 1413       Check-In   Supervising physician immediately available to respond to emergencies [facility] immediately available    Physician(s) Edd Fabian, NP    [facility]    Staff Present Raford Pitcher, MS, ACSM-CEP, Exercise Physiologist;Randi Dionisio Paschal, ACSM-CEP, Exercise Physiologist;Mary Gerre Scull, RN, BSN;Casey Smith, RT;Samantha Belarus, RD, LDN    Virtual Visit No    Medication changes reported     No    Fall or balance concerns reported    No    Tobacco Cessation No Change    Warm-up and Cool-down Performed as group-led instruction    Resistance Training Performed Yes    VAD Patient? No    PAD/SET Patient? No      Pain Assessment   Currently in Pain? No/denies    Multiple Pain Sites No             Capillary Blood Glucose: No results found for this or any previous visit (from the past 24 hour(s)).    Social History   Tobacco Use  Smoking Status Never  Smokeless Tobacco Never    Goals Met:  Proper associated with RPD/PD & O2 Sat Independence with exercise equipment Exercise tolerated well No report of concerns or symptoms today Strength training completed today  Goals Unmet:  Not Applicable  Comments: Service time is from 1325 to 1450.    Dr. Mechele Collin is Medical Director for Pulmonary Rehab at [facility].

## 2023-08-08 DIAGNOSIS — Z8673 Personal history of transient ischemic attack (TIA), and cerebral infarction without residual deficits: Secondary | ICD-10-CM | POA: Diagnosis not present

## 2023-08-08 DIAGNOSIS — Z833 Family history of diabetes mellitus: Secondary | ICD-10-CM | POA: Diagnosis not present

## 2023-08-08 DIAGNOSIS — E1151 Type 2 diabetes mellitus with diabetic peripheral angiopathy without gangrene: Secondary | ICD-10-CM | POA: Diagnosis not present

## 2023-08-08 DIAGNOSIS — N185 Chronic kidney disease, stage 5: Secondary | ICD-10-CM | POA: Diagnosis not present

## 2023-08-08 DIAGNOSIS — I2510 Atherosclerotic heart disease of native coronary artery without angina pectoris: Secondary | ICD-10-CM | POA: Diagnosis not present

## 2023-08-09 ENCOUNTER — Encounter (HOSPITAL_COMMUNITY)
Admission: RE | Admit: 2023-08-09 | Discharge: 2023-08-09 | Disposition: A | Discharge status: Home / Self Care | Payer: Medicare HMO | Source: Ambulatory Visit | Attending: Cardiovascular Disease | Admitting: Cardiovascular Disease

## 2023-08-09 ENCOUNTER — Encounter (HOSPITAL_COMMUNITY): Payer: Medicare HMO

## 2023-08-09 VITALS — Wt 117.7 lb

## 2023-08-09 DIAGNOSIS — R079 Chest pain, unspecified: Secondary | ICD-10-CM | POA: Insufficient documentation

## 2023-08-09 DIAGNOSIS — I5032 Chronic diastolic (congestive) heart failure: Secondary | ICD-10-CM | POA: Diagnosis not present

## 2023-08-09 NOTE — Progress Notes (Signed)
Daily Session Note  Patient Details  Name: [name] MRN: [id number] Date of Birth: 22-Dec-1950 Referring Provider:   Doristine Devoid Pulmonary Rehab Walk Test from 06/03/2023 in [facility] for Heart, Vascular, & Lung Health  Referring Provider Allyson Sabal       Encounter Date: 08/09/2023  Check In:  Session Check In - 08/09/23 1325       Check-In   Supervising physician immediately available to respond to emergencies [facility] MD immediately available    Physician(s) Robin Searing, NP    [facility]    Staff Present Raford Pitcher, MS, ACSM-CEP, Exercise Physiologist;Randi Dionisio Paschal, ACSM-CEP, Exercise Physiologist;Mary Gerre Scull, RN, BSN;Alonnah Lampkins, RT;Samantha Belarus, RD, LDN    Virtual Visit No    Medication changes reported     No    Fall or balance concerns reported    No    Tobacco Cessation No Change    Warm-up and Cool-down Performed as group-led instruction    Resistance Training Performed Yes    VAD Patient? No    PAD/SET Patient? No      Pain Assessment   Currently in Pain? No/denies    Multiple Pain Sites No             Capillary Blood Glucose: No results found for this or any previous visit (from the past 24 hour(s)).   Exercise Prescription Changes - 08/09/23 1500       Response to Exercise   Blood Pressure (Admit) 102/64    Blood Pressure (Exercise) 112/62    Blood Pressure (Exit) 106/64    Heart Rate (Admit) 76 bpm    Heart Rate (Exercise) 92 bpm    Heart Rate (Exit) 68 bpm    Oxygen Saturation (Admit) 99 %    Oxygen Saturation (Exercise) 100 %    Oxygen Saturation (Exit) 100 %    Rating of Perceived Exertion (Exercise) 11    Perceived Dyspnea (Exercise) 1    Duration Continue with 30 min of aerobic exercise without signs/symptoms of physical distress.    Intensity THRR unchanged      Progression   Progression Continue to progress workloads to maintain intensity without signs/symptoms of physical  distress.      Resistance Training   Training Prescription Yes    Weight blue bands    Reps 10-15    Time 10 Minutes      Treadmill   MPH 2.5    Grade 2    Minutes 15    METs 3.6      Bike   Level 4    Minutes 15    METs 3.8             Social History   Tobacco Use  Smoking Status Never  Smokeless Tobacco Never    Goals Met:  Proper associated with RPD/PD & O2 Sat Independence with exercise equipment Exercise tolerated well No report of concerns or symptoms today Strength training completed today  Goals Unmet:  Not Applicable  Comments: Service time is from 1318 to 1436.    Dr. Mechele Collin is Medical Director for Pulmonary Rehab at [facility].

## 2023-08-10 NOTE — Progress Notes (Signed)
Pulmonary Individual Treatment Plan  Patient Details  Name: [name] MRN: [id number] Date of Birth: 1951-10-04 Referring Provider:   Doristine Devoid Pulmonary Rehab Walk Test from 06/03/2023 in [facility]  Referring Provider Allyson Sabal       Initial Encounter Date:  Flowsheet Row Pulmonary Rehab Walk Test from 06/03/2023 in [facility]  Date 06/03/23       Visit Diagnosis: Heart failure, diastolic, chronic (HCC)  Patient's Home Medications on Admission:   Current Outpatient Medications:    albuterol (PROVENTIL HFA;VENTOLIN HFA) 108 (90 BASE) MCG/ACT inhaler, Inhale 2 puffs into the lungs every 4 (four) hours as needed for wheezing or shortness of breath., Disp: , Rfl:    Alcohol Swabs (B-D SINGLE USE SWABS REGULAR) PADS, , Disp: , Rfl:    ALPRAZolam (XANAX) 1 MG tablet, Take 1 mg by mouth 3 (three) times daily. , Disp: , Rfl: 2   Artificial Tear GEL, Place 2 drops into both eyes daily as needed (dry eyes). , Disp: , Rfl:    aspirin 325 MG EC tablet, Take 325 mg by mouth daily., Disp: , Rfl:    benzonatate (TESSALON) 100 MG capsule, Take 1 capsule (100 mg total) by mouth 3 (three) times daily as needed for cough., Disp: 30 capsule, Rfl: 0   BIOTIN PO, Take 1 tablet by mouth daily., Disp: , Rfl:    cetirizine (ZYRTEC) 10 MG tablet, Take 10 mg by mouth daily., Disp: , Rfl:    Cholecalciferol (VITAMIN D-3) 1000 UNITS CAPS, Take 1,000 Units by mouth daily. , Disp: , Rfl:    clopidogrel (PLAVIX) 75 MG tablet, TAKE 1 TABLET EVERY DAY, Disp: 90 tablet, Rfl: 3   cyclobenzaprine (FLEXERIL) 5 MG tablet, Take 1 tablet (5 mg total) by mouth at bedtime as needed for muscle spasms., Disp: 30 tablet, Rfl: 0   Docusate Sodium (DSS) 250 MG CAPS, Take 1 capsule by mouth as needed., Disp: , Rfl:    doxazosin (CARDURA) 4 MG tablet, Take 4 mg by mouth at bedtime. , Disp: , Rfl:     DROPLET PEN NEEDLES 32G X 4 MM MISC, , Disp: , Rfl:    EPINEPHrine 0.3 mg/0.3 mL IJ SOAJ injection, Inject 0.3 mg into the muscle as needed for anaphylaxis. , Disp: , Rfl: 3   ezetimibe (ZETIA) 10 MG tablet, TAKE 1 TABLET EVERY DAY, Disp: 90 tablet, Rfl: 3   Flaxseed, Linseed, (FLAXSEED OIL) 1200 MG CAPS, Take 1,200 mg by mouth daily., Disp: , Rfl:    fluticasone (FLONASE) 50 MCG/ACT nasal spray, Place 2 sprays into both nostrils daily as needed for allergies or rhinitis., Disp: , Rfl:    furosemide (LASIX) 80 MG tablet, TAKE 1 TABLET TWICE A DAY. MAY TAKE AN ADDITIONAL 80MG  (1 TABLET) AS NEEDED FOR EDEMA, Disp: 270 tablet, Rfl: 3   hydrALAZINE (APRESOLINE) 25 MG tablet, TAKE 1 TABLET TWICE DAILY, Disp: 180 tablet, Rfl: 3   isosorbide mononitrate (IMDUR) 60 MG 24 hr tablet, TAKE 1 TABLET EVERY DAY, Disp: 90 tablet, Rfl: 3   Ketotifen Fumarate (ALAWAY OP), Place 1 drop into both eyes daily as needed (allergies). , Disp: , Rfl:    LANTUS SOLOSTAR 100 UNIT/ML Solostar Pen, Inject 6 Units into the skin at bedtime., Disp: , Rfl:    metoprolol succinate (TOPROL-XL) 100 MG 24 hr tablet, TAKE 2 TABLETS EVERY DAY WITH OR IMMEDIATELY FOLLOWING A MEAL,  Disp: 180 tablet, Rfl: 3   Multiple Vitamin (MULTIVITAMIN WITH MINERALS) TABS, Take 1 tablet by mouth daily., Disp: , Rfl:    nitroGLYCERIN (NITROSTAT) 0.4 MG SL tablet, Place 1 tablet under the tongue every 5 minutes as needed for chest pain, max 3 doses, go to er if no relief, Disp: 75 tablet, Rfl: 2   olmesartan (BENICAR) 40 MG tablet, TAKE 1 TABLET EVERY DAY, Disp: 90 tablet, Rfl: 3   ondansetron (ZOFRAN) 8 MG tablet, Take 8 mg by mouth every 8 (eight) hours as needed for nausea or vomiting., Disp: , Rfl:    OZEMPIC, 0.25 OR 0.5 MG/DOSE, 2 MG/1.5ML SOPN, Inject 0.25 mg into the skin once a week. Sunday, Disp: , Rfl:    pantoprazole (PROTONIX) 40 MG tablet, TAKE 1 TABLET BY MOUTH  DAILY, Disp: 90 tablet, Rfl: 3   potassium chloride (KLOR-CON M) 10 MEQ tablet,  TAKE 2 TABLETS TWICE A DAY (KEEP MD APPOINTMENT FOR REFILLS), Disp: 360 tablet, Rfl: 3   simvastatin (ZOCOR) 40 MG tablet, TAKE 1 TABLET (40 MG TOTAL) BY MOUTH AT BEDTIME., Disp: 90 tablet, Rfl: 3   sodium chloride (MURO 128) 5 % ophthalmic ointment, Place 1 drop into both eyes at bedtime. For dry eyes, Disp: , Rfl:    traMADol (ULTRAM) 50 MG tablet, Take 100 mg by mouth every 6 (six) hours as needed (MIGRAINES)., Disp: , Rfl:    TRUE METRIX BLOOD GLUCOSE TEST test strip, , Disp: , Rfl:    TRUEplus Lancets 33G MISC, , Disp: , Rfl:    venlafaxine (EFFEXOR) 100 MG tablet, Take 100-200 mg by mouth See admin instructions. TAKE 200 mg by mouth in the morning and take 100 mg at noon, Disp: , Rfl:    vitamin A 7500 UNIT capsule, Take 2,400 Units by mouth daily., Disp: , Rfl:   Past Medical History: Past Medical History:  Diagnosis Date   AICD (automatic cardioverter/defibrillator) present    [employer]- Dr. Rosette Reveal follows   Anginal pain (HCC)    Anxiety    Asthma    Back pain    "pinched nerve-lower back" - Dr. Ethelene Hal follows.   Biventricular implantable cardioverter-defibrillator in situ 06/18/2011   Cerebral infarction (HCC) 11/11/2012   Cervical dysplasia    Chronic diastolic heart failure (HCC) 03/22/2016   Chronic kidney disease    Dr. Allena Katz follows.   Chronic renal insufficiency, stage III (moderate) (HCC) 11/11/2012   Complication of anesthesia    "I wake up during surgeries" (02/14/2013)   Coronary artery disease involving coronary bypass graft of native heart with angina pectoris (HCC) 06/18/2011   Depression    DM (diabetes mellitus) (HCC) 11/11/2012   Fibroid    Function kidney decreased    GERD (gastroesophageal reflux disease)    Hemiplegia, unspecified, affecting nondominant side 11/11/2012   Hepatitis    Hepatitis A -college yrs"water source exposure"   Hiatal hernia    History of shingles    2-3 yrs ago last out break "around waist"   History of stomach ulcers    Hyperlipidemia  07/30/2016   Hypertension    ICD (implantable cardiac defibrillator) in place    Iron deficiency anemia    Ischemic cardiomyopathy    status post biventricular ICD placed by DR Edumunds who used to see Dr Elsie Lincoln here to establish  cardiovascular care.   Migraines    MVP (mitral valve prolapse)    Antibiotics not required for procedures   Myocardial infarction (HCC)    "  I've had 2; the others they were able to catch before completing" (02/14/2013)   Pacemaker    Paroxysmal SVT (supraventricular tachycardia) 06/18/2011   Pneumonia 1950's & 1985   Shortness of breath    "lying down flat; at times w/exertion" (02/14/2013). 10-06-15 exertion only..   Stroke [facility])    "2 confirmed; 9 TIA's; results in dragging LLE; numbness in tip of tongue" (02/14/2013),10-06-15 right hand tends to be weaker when tired.   TIA (transient ischemic attack) 11/11/2012   Type II diabetes mellitus (HCC)     Tobacco Use: Social History   Tobacco Use  Smoking Status Never  Smokeless Tobacco Never    Labs: Review Flowsheet  More data exists      Latest Ref Rng & Units 01/03/2015 01/01/2016 01/02/2016 08/15/2018 04/06/2019  Labs for ITP Cardiac and Pulmonary Rehab  Cholestrol 100 - 199 mg/dL 161  - 096  045  409   LDL (calc) 0 - 99 mg/dL 70  - 66  72  92   HDL-C >39 mg/dL 54  - 41  49  61   Trlycerides 0 - 149 mg/dL 811  - 914  782  956   Hemoglobin A1c 4.8 - 5.6 % - - 6.9  - 6.5   TCO2 0 - 100 mmol/L - 25  - - -    Details            Capillary Blood Glucose: Lab Results  Component Value Date   GLUCAP 140 (H) 06/14/2023   GLUCAP 157 (H) 06/14/2023   GLUCAP 122 (H) 06/09/2023   GLUCAP 105 (H) 06/09/2023   GLUCAP 134 (H) 08/05/2018    POCT Glucose     Row Name 06/03/23 1337             POCT Blood Glucose   Pre-Exercise 128 mg/dL                Pulmonary Assessment Scores:  Pulmonary Assessment Scores     Row Name 06/03/23 1358         ADL UCSD   ADL Phase Entry     SOB Score total  47       CAT Score   CAT Score 18       mMRC Score   mMRC Score 3             UCSD: Self-administered rating of dyspnea associated with activities of daily living (ADLs) 6-point scale (0 = "not at all" to 5 = "maximal or unable to do because of breathlessness")  Scoring Scores range from 0 to 120.  Minimally important difference is 5 units  CAT: CAT can identify the health impairment of COPD patients and is better correlated with disease progression.  CAT has a scoring range of zero to 40. The CAT score is classified into four groups of low (less than 10), medium (10 - 20), high (21-30) and very high (31-40) based on the impact level of disease on health status. A CAT score over 10 suggests significant symptoms.  A worsening CAT score could be explained by an exacerbation, poor medication adherence, poor inhaler technique, or progression of COPD or comorbid conditions.  CAT MCID is 2 points  mMRC: mMRC (Modified Medical Research Council) Dyspnea Scale is used to assess the degree of baseline functional disability in patients of respiratory disease due to dyspnea. No minimal important difference is established. A decrease in score of 1 point or greater is considered a positive change.  Pulmonary Function Assessment:  Pulmonary Function Assessment - 06/03/23 1340       Breath   Bilateral Breath Sounds Clear    Shortness of Breath Yes             Exercise Target Goals: Exercise Program Goal: Individual exercise prescription set using results from initial 6 min walk test and THRR while considering  patient's activity barriers and safety.   Exercise Prescription Goal: Initial exercise prescription builds to 30-45 minutes a day of aerobic activity, 2-3 days per week.  Home exercise guidelines will be given to patient during program as part of exercise prescription that the participant will acknowledge.  Activity Barriers & Risk Stratification:  Activity Barriers & Cardiac  Risk Stratification - 06/03/23 1339       Activity Barriers & Cardiac Risk Stratification   Activity Barriers Deconditioning;Muscular Weakness;Shortness of Breath;Balance Concerns             6 Minute Walk:  6 Minute Walk     Row Name 06/03/23 1545         6 Minute Walk   Phase Initial     Distance 1172 feet     Walk Time 6 minutes     # of Rest Breaks 0     MPH 2.22     METS 2.87     RPE 13     Perceived Dyspnea  1.5     VO2 Peak 10.03     Symptoms No     Resting HR 80 bpm     Resting BP 122/64     Resting Oxygen Saturation  99 %     Exercise Oxygen Saturation  during 6 min walk 94 %     Max Ex. HR 93 bpm     Max Ex. BP 118/70     2 Minute Post BP 114/68       Interval HR   1 Minute HR 83     2 Minute HR 83     3 Minute HR 80     4 Minute HR 87     5 Minute HR 92     6 Minute HR 93     2 Minute Post HR 73     Interval Heart Rate? Yes       Interval Oxygen   Interval Oxygen? Yes     Baseline Oxygen Saturation % 99 %     1 Minute Oxygen Saturation % 97 %     1 Minute Liters of Oxygen 0 L     2 Minute Oxygen Saturation % 94 %     2 Minute Liters of Oxygen 0 L     3 Minute Oxygen Saturation % 100 %     3 Minute Liters of Oxygen 0 L     4 Minute Oxygen Saturation % 100 %     4 Minute Liters of Oxygen 0 L     5 Minute Oxygen Saturation % 99 %     5 Minute Liters of Oxygen 0 L     6 Minute Oxygen Saturation % 100 %     6 Minute Liters of Oxygen 0 L     2 Minute Post Oxygen Saturation % 100 %     2 Minute Post Liters of Oxygen 0 L              Oxygen Initial Assessment:  Oxygen Initial Assessment - 06/03/23 1340       Home  Oxygen   Home Oxygen Device None    Sleep Oxygen Prescription None    Home Exercise Oxygen Prescription None    Home Resting Oxygen Prescription None      Initial 6 min Walk   Oxygen Used None      Program Oxygen Prescription   Program Oxygen Prescription None      Intervention   Short Term Goals To learn and  understand importance of maintaining oxygen saturations>88%;To learn and demonstrate proper use of respiratory medications;To learn and understand importance of monitoring SPO2 with pulse oximeter and demonstrate accurate use of the pulse oximeter.;To learn and demonstrate proper pursed lip breathing techniques or other breathing techniques.     Long  Term Goals Maintenance of O2 saturations>88%;Compliance with respiratory medication;Verbalizes importance of monitoring SPO2 with pulse oximeter and return demonstration;Exhibits proper breathing techniques, such as pursed lip breathing or other method taught during program session;Demonstrates proper use of MDI's             Oxygen Re-Evaluation:  Oxygen Re-Evaluation     Row Name 06/09/23 0905 07/05/23 0926 08/02/23 0840         Program Oxygen Prescription   Program Oxygen Prescription None None None       Home Oxygen   Home Oxygen Device None None None     Sleep Oxygen Prescription None None None     Home Exercise Oxygen Prescription None None None     Home Resting Oxygen Prescription None None None       Goals/Expected Outcomes   Short Term Goals To learn and understand importance of maintaining oxygen saturations>88%;To learn and demonstrate proper use of respiratory medications;To learn and understand importance of monitoring SPO2 with pulse oximeter and demonstrate accurate use of the pulse oximeter.;To learn and demonstrate proper pursed lip breathing techniques or other breathing techniques.  To learn and understand importance of maintaining oxygen saturations>88%;To learn and demonstrate proper use of respiratory medications;To learn and understand importance of monitoring SPO2 with pulse oximeter and demonstrate accurate use of the pulse oximeter.;To learn and demonstrate proper pursed lip breathing techniques or other breathing techniques.  To learn and understand importance of maintaining oxygen saturations>88%;To learn and  demonstrate proper use of respiratory medications;To learn and understand importance of monitoring SPO2 with pulse oximeter and demonstrate accurate use of the pulse oximeter.;To learn and demonstrate proper pursed lip breathing techniques or other breathing techniques.      Long  Term Goals Maintenance of O2 saturations>88%;Compliance with respiratory medication;Verbalizes importance of monitoring SPO2 with pulse oximeter and return demonstration;Exhibits proper breathing techniques, such as pursed lip breathing or other method taught during program session;Demonstrates proper use of MDI's Maintenance of O2 saturations>88%;Compliance with respiratory medication;Verbalizes importance of monitoring SPO2 with pulse oximeter and return demonstration;Exhibits proper breathing techniques, such as pursed lip breathing or other method taught during program session;Demonstrates proper use of MDI's Maintenance of O2 saturations>88%;Compliance with respiratory medication;Verbalizes importance of monitoring SPO2 with pulse oximeter and return demonstration;Exhibits proper breathing techniques, such as pursed lip breathing or other method taught during program session;Demonstrates proper use of MDI's     Goals/Expected Outcomes Compliance and understanding of oxygen saturation monitoring and breathing techniques to decrease shortness of breath Compliance and understanding of oxygen saturation monitoring and breathing techniques to decrease shortness of breath Compliance and understanding of oxygen saturation monitoring and breathing techniques to decrease shortness of breath              Oxygen Discharge (Final Oxygen Re-Evaluation):  Oxygen Re-Evaluation - 08/02/23 0840       Program Oxygen Prescription   Program Oxygen Prescription None      Home Oxygen   Home Oxygen Device None    Sleep Oxygen Prescription None    Home Exercise Oxygen Prescription None    Home Resting Oxygen Prescription None       Goals/Expected Outcomes   Short Term Goals To learn and understand importance of maintaining oxygen saturations>88%;To learn and demonstrate proper use of respiratory medications;To learn and understand importance of monitoring SPO2 with pulse oximeter and demonstrate accurate use of the pulse oximeter.;To learn and demonstrate proper pursed lip breathing techniques or other breathing techniques.     Long  Term Goals Maintenance of O2 saturations>88%;Compliance with respiratory medication;Verbalizes importance of monitoring SPO2 with pulse oximeter and return demonstration;Exhibits proper breathing techniques, such as pursed lip breathing or other method taught during program session;Demonstrates proper use of MDI's    Goals/Expected Outcomes Compliance and understanding of oxygen saturation monitoring and breathing techniques to decrease shortness of breath             Initial Exercise Prescription:  Initial Exercise Prescription - 06/03/23 1400       Date of Initial Exercise RX and Referring Provider   Date 06/03/23    Referring Provider Allyson Sabal    Expected Discharge Date 09/01/23      Treadmill   MPH 2    Grade 0    Minutes 15    METs 2.53      Bike   Level 1    Watts 20    Minutes 15      Prescription Details   Frequency (times per week) 2    Duration Progress to 30 minutes of continuous aerobic without signs/symptoms of physical distress      Intensity   THRR 40-80% of Max Heartrate 60-121    Ratings of Perceived Exertion 11-13    Perceived Dyspnea 0-4      Progression   Progression Continue to progress workloads to maintain intensity without signs/symptoms of physical distress.      Resistance Training   Training Prescription Yes    Weight red bands    Reps 10-15             Perform Capillary Blood Glucose checks as needed.  Exercise Prescription Changes:   Exercise Prescription Changes     Row Name 06/14/23 1500 06/23/23 1450 07/26/23 1500 08/09/23  1500       Response to Exercise   Blood Pressure (Admit) 110/66 124/68 140/64 102/64    Blood Pressure (Exercise) 118/60 -- 130/64 112/62    Blood Pressure (Exit) 108/58 120/60 114/66 106/64    Heart Rate (Admit) 77 bpm 83 bpm 71 bpm 76 bpm    Heart Rate (Exercise) 91 bpm 95 bpm 96 bpm 92 bpm    Heart Rate (Exit) 78 bpm 83 bpm 72 bpm 68 bpm    Oxygen Saturation (Admit) 98 % 99 % 98 % 99 %    Oxygen Saturation (Exercise) 98 % 98 % 98 % 100 %    Oxygen Saturation (Exit) 100 % 97 % 100 % 100 %    Rating of Perceived Exertion (Exercise) 12 13 11 11     Perceived Dyspnea (Exercise) 1 2 1 1     Duration Progress to 30 minutes of  aerobic without signs/symptoms of physical distress Progress to 30 minutes of  aerobic without signs/symptoms of physical distress Continue with 30 min of  aerobic exercise without signs/symptoms of physical distress. Continue with 30 min of aerobic exercise without signs/symptoms of physical distress.    Intensity THRR unchanged THRR unchanged THRR unchanged THRR unchanged      Progression   Progression Continue to progress workloads to maintain intensity without signs/symptoms of physical distress. Continue to progress workloads to maintain intensity without signs/symptoms of physical distress. Continue to progress workloads to maintain intensity without signs/symptoms of physical distress. Continue to progress workloads to maintain intensity without signs/symptoms of physical distress.      Resistance Training   Training Prescription Yes Yes Yes Yes    Weight red bands red bands blue bands blue bands    Reps 10-15 10-15 10-15 10-15    Time 10 Minutes 10 Minutes 10 Minutes 10 Minutes      Treadmill   MPH 2 2 2.5 2.5    Grade 1 0 1 2    Minutes 15 15 15 15     METs 2.81 2.53 3.26 3.6      Bike   Level 1 2 4 4     Minutes 15 15 15 15     METs 2.8 3.5 3.8 3.8      Home Exercise Plan   Plans to continue exercise at -- -- Home (comment)  walking in basement --     Frequency -- -- Add 1 additional day to program exercise sessions. --    Initial Home Exercises Provided -- -- 07/26/23 --             Exercise Comments:   Exercise Comments     Row Name 06/09/23 1549 07/26/23 1541         Exercise Comments Pt completed first day of group exercise. Exercised on scifit bike for 15 min, level 1, 2.8 METs. She then walked the track for 15 min, speed 2.0, METs 2.9. Tolerated well. Performed warm up and cooldown with verbal cues. She c/o knee pain with squats. Discussed with [name] home exercise plan. She is currently doing resistance bands x1 a week. Discussed beginning to walk in her basement or outside 15 min x2 or 30 min, 1-2 non rehab days a week. She is agreeable. She will also look in to [employer] program at the [facility] in October.               Exercise Goals and Review:   Exercise Goals     Row Name 06/03/23 1339 06/09/23 0904 07/05/23 0922         Exercise Goals   Increase Physical Activity Yes Yes Yes     Intervention Provide advice, education, support and counseling about physical activity/exercise needs.;Develop an individualized exercise prescription for aerobic and resistive training based on initial evaluation findings, risk stratification, comorbidities and participant's personal goals. Provide advice, education, support and counseling about physical activity/exercise needs.;Develop an individualized exercise prescription for aerobic and resistive training based on initial evaluation findings, risk stratification, comorbidities and participant's personal goals. Provide advice, education, support and counseling about physical activity/exercise needs.;Develop an individualized exercise prescription for aerobic and resistive training based on initial evaluation findings, risk stratification, comorbidities and participant's personal goals.     Expected Outcomes Short Term: Attend rehab on a regular basis to increase amount of physical  activity.;Long Term: Exercising regularly at least 3-5 days a week.;Long Term: Add in home exercise to make exercise part of routine and to increase amount of physical activity. Short Term: Attend rehab on a regular basis to increase amount of physical  activity.;Long Term: Exercising regularly at least 3-5 days a week.;Long Term: Add in home exercise to make exercise part of routine and to increase amount of physical activity. Short Term: Attend rehab on a regular basis to increase amount of physical activity.;Long Term: Exercising regularly at least 3-5 days a week.;Long Term: Add in home exercise to make exercise part of routine and to increase amount of physical activity.     Increase Strength and Stamina Yes Yes Yes     Intervention Provide advice, education, support and counseling about physical activity/exercise needs.;Develop an individualized exercise prescription for aerobic and resistive training based on initial evaluation findings, risk stratification, comorbidities and participant's personal goals. Provide advice, education, support and counseling about physical activity/exercise needs.;Develop an individualized exercise prescription for aerobic and resistive training based on initial evaluation findings, risk stratification, comorbidities and participant's personal goals. Provide advice, education, support and counseling about physical activity/exercise needs.;Develop an individualized exercise prescription for aerobic and resistive training based on initial evaluation findings, risk stratification, comorbidities and participant's personal goals.     Expected Outcomes Short Term: Increase workloads from initial exercise prescription for resistance, speed, and METs.;Short Term: Perform resistance training exercises routinely during rehab and add in resistance training at home;Long Term: Improve cardiorespiratory fitness, muscular endurance and strength as measured by increased METs and functional  capacity ( ) Short Term: Increase workloads from initial exercise prescription for resistance, speed, and METs.;Short Term: Perform resistance training exercises routinely during rehab and add in resistance training at home;Long Term: Improve cardiorespiratory fitness, muscular endurance and strength as measured by increased METs and functional capacity ( ) Short Term: Increase workloads from initial exercise prescription for resistance, speed, and METs.;Short Term: Perform resistance training exercises routinely during rehab and add in resistance training at home;Long Term: Improve cardiorespiratory fitness, muscular endurance and strength as measured by increased METs and functional capacity ( )     Able to understand and use rate of perceived exertion (RPE) scale Yes Yes Yes     Intervention Provide education and explanation on how to use RPE scale Provide education and explanation on how to use RPE scale Provide education and explanation on how to use RPE scale     Expected Outcomes Short Term: Able to use RPE daily in rehab to express subjective intensity level;Long Term:  Able to use RPE to guide intensity level when exercising independently Short Term: Able to use RPE daily in rehab to express subjective intensity level;Long Term:  Able to use RPE to guide intensity level when exercising independently Short Term: Able to use RPE daily in rehab to express subjective intensity level;Long Term:  Able to use RPE to guide intensity level when exercising independently     Able to understand and use Dyspnea scale Yes Yes Yes     Intervention Provide education and explanation on how to use Dyspnea scale Provide education and explanation on how to use Dyspnea scale Provide education and explanation on how to use Dyspnea scale     Expected Outcomes Short Term: Able to use Dyspnea scale daily in rehab to express subjective sense of shortness of breath during exertion;Long Term: Able to use Dyspnea scale  to guide intensity level when exercising independently Short Term: Able to use Dyspnea scale daily in rehab to express subjective sense of shortness of breath during exertion;Long Term: Able to use Dyspnea scale to guide intensity level when exercising independently Short Term: Able to use Dyspnea scale daily in rehab to express subjective sense of shortness of breath  during exertion;Long Term: Able to use Dyspnea scale to guide intensity level when exercising independently     Knowledge and understanding of Target Heart Rate Range (THRR) Yes Yes Yes     Intervention Provide education and explanation of THRR including how the numbers were predicted and where they are located for reference Provide education and explanation of THRR including how the numbers were predicted and where they are located for reference Provide education and explanation of THRR including how the numbers were predicted and where they are located for reference     Expected Outcomes Short Term: Able to state/look up THRR;Short Term: Able to use daily as guideline for intensity in rehab;Long Term: Able to use THRR to govern intensity when exercising independently Short Term: Able to state/look up THRR;Short Term: Able to use daily as guideline for intensity in rehab;Long Term: Able to use THRR to govern intensity when exercising independently Short Term: Able to state/look up THRR;Short Term: Able to use daily as guideline for intensity in rehab;Long Term: Able to use THRR to govern intensity when exercising independently     Understanding of Exercise Prescription Yes Yes Yes     Intervention Provide education, explanation, and written materials on patient's individual exercise prescription Provide education, explanation, and written materials on patient's individual exercise prescription Provide education, explanation, and written materials on patient's individual exercise prescription     Expected Outcomes Short Term: Able to explain  program exercise prescription;Long Term: Able to explain home exercise prescription to exercise independently Short Term: Able to explain program exercise prescription;Long Term: Able to explain home exercise prescription to exercise independently Short Term: Able to explain program exercise prescription;Long Term: Able to explain home exercise prescription to exercise independently              Exercise Goals Re-Evaluation :  Exercise Goals Re-Evaluation     Row Name 06/09/23 0904 07/05/23 0922 08/02/23 0840         Exercise Goal Re-Evaluation   Exercise Goals Review Increase Physical Activity;Able to understand and use Dyspnea scale;Understanding of Exercise Prescription;Increase Strength and Stamina;Knowledge and understanding of Target Heart Rate Range (THRR);Able to understand and use rate of perceived exertion (RPE) scale Increase Physical Activity;Able to understand and use Dyspnea scale;Understanding of Exercise Prescription;Increase Strength and Stamina;Knowledge and understanding of Target Heart Rate Range (THRR);Able to understand and use rate of perceived exertion (RPE) scale Increase Physical Activity;Able to understand and use Dyspnea scale;Understanding of Exercise Prescription;Increase Strength and Stamina;Knowledge and understanding of Target Heart Rate Range (THRR);Able to understand and use rate of perceived exertion (RPE) scale     Comments Pt to begin exercise 8/1. Will progress as tolerated Pt has completed 5 days of group exercise. She had good attendance until her daughter passed away recently. She has missed 2 sessions but plans to return in September. She is exercising on scifit bike for 15 min, level 2, 3.5 METs. She then walks on the treadmill for 15 min, speed 2.0, 0 incline, METs 2.53. Tolerating well, will progress as tolerated. Pt has completed 9 days of group exercise. She has perfect attendance since returning. She is exercising on scifit bike for 15 min, level 4,  3.5 METs. She then walks on the treadmill for 15 min, speed 2.5, 1 incline, METs 3.26. Tolerating well, motivated, will progress as tolerated.     Expected Outcomes Through exercise at rehab and home, the patient will decrease shortness of breath with daily activities and feel confident in carrying out an  exercise regimen at home Through exercise at rehab and home, the patient will decrease shortness of breath with daily activities and feel confident in carrying out an exercise regimen at home Through exercise at rehab and home, the patient will decrease shortness of breath with daily activities and feel confident in carrying out an exercise regimen at home              Discharge Exercise Prescription (Final Exercise Prescription Changes):  Exercise Prescription Changes - 08/09/23 1500       Response to Exercise   Blood Pressure (Admit) 102/64    Blood Pressure (Exercise) 112/62    Blood Pressure (Exit) 106/64    Heart Rate (Admit) 76 bpm    Heart Rate (Exercise) 92 bpm    Heart Rate (Exit) 68 bpm    Oxygen Saturation (Admit) 99 %    Oxygen Saturation (Exercise) 100 %    Oxygen Saturation (Exit) 100 %    Rating of Perceived Exertion (Exercise) 11    Perceived Dyspnea (Exercise) 1    Duration Continue with 30 min of aerobic exercise without signs/symptoms of physical distress.    Intensity THRR unchanged      Progression   Progression Continue to progress workloads to maintain intensity without signs/symptoms of physical distress.      Resistance Training   Training Prescription Yes    Weight blue bands    Reps 10-15    Time 10 Minutes      Treadmill   MPH 2.5    Grade 2    Minutes 15    METs 3.6      Bike   Level 4    Minutes 15    METs 3.8             Nutrition:  Target Goals: Understanding of nutrition guidelines, daily intake of sodium 1500mg , cholesterol 200mg , calories 30% from fat and 7% or less from saturated fats, daily to have 5 or more servings of  fruits and vegetables.  Biometrics:  Pre Biometrics - 06/03/23 1334       Pre Biometrics   Grip Strength 13 kg              Nutrition Therapy Plan and Nutrition Goals:  Nutrition Therapy & Goals - 08/09/23 1445       Nutrition Therapy   Diet Renal Diet      Personal Nutrition Goals   Nutrition Goal Patient to identify strategies for weight maintenance/weight gain of 0.5-2.0# per week.   goal in progress.   Comments Goal in progress. Patient has maintained weight since starting with our program. [name] continues to grieve the unexpected death of her daughter. [name] reports following a vegetarian diet for >30 years; her primary protein sources are beans, dairy, etc. She is mindful of sodium. She is on ozempic for blood sugar control and recently stopped lantus; her A1c is 5.9. She does report blood sugar lows 2-3x/week in the middle of the night. She continues to follow-up with [facility] for kidney transplant. She is motivated to maintain her weight; however. [name] will continue to benefit from participation in pulmonary rehab for nutrition and exercise support.      Intervention Plan   Intervention Prescribe, educate and counsel regarding individualized specific dietary modifications aiming towards targeted core components such as weight, hypertension, lipid management, diabetes, heart failure and other comorbidities.;Nutrition handout(s) given to patient.    Expected Outcomes Short Term Goal: Understand basic principles of dietary content, such as  calories, fat, sodium, cholesterol and nutrients.;Long Term Goal: Adherence to prescribed nutrition plan.             Nutrition Assessments:  Nutrition Assessments - 06/09/23 1436       Rate Your Plate Scores   Pre Score 74            MEDIFICTS Score Key: >=70 Need to make dietary changes  40-70 Heart Healthy Diet <= 40 Therapeutic Level Cholesterol Diet  Flowsheet Row PULMONARY REHAB CHRONIC OBSTRUCTIVE PULMONARY DISEASE  from 06/09/2023 in [facility]  Picture Your Plate Total Score on Admission 74      Picture Your Plate Scores: <78 Unhealthy dietary pattern with much room for improvement. 41-50 Dietary pattern unlikely to meet recommendations for good health and room for improvement. 51-60 More healthful dietary pattern, with some room for improvement.  >60 Healthy dietary pattern, although there may be some specific behaviors that could be improved.    Nutrition Goals Re-Evaluation:  Nutrition Goals Re-Evaluation     Row Name 06/09/23 1431 07/12/23 1454 08/09/23 1445         Goals   Current Weight 118 lb 2.7 oz (53.6 kg) 115 lb 15.4 oz (52.6 kg) 117 lb 11.6 oz (53.4 kg)     Comment Cr 3.1, GFR 16 Cr 3.1, GFR 16 no new labs; most recent labs  Cr 3.1, GFR 16     Expected Outcome Libbi reports following a vegetarian diet for >30 years; her primary protein sources are beans, dairy, etc. She is mindful of sodium. She is on ozempic for blood sugar control and recently stopped lantus; her A1c is 5.9. She does report blood sugar lows 2-3x/week in the middle of the night. She continues to follow-up with [facility] for kidney transplant. She is motivated to maintain her weight; she does not unwanted weight loss since starting Ozempic. Mandolin will continue to benefit from participation in pulmonary rehab for nutrition and exercise support. Goal not met. Timberly has attended 5 exercise session due to recent death of her daughter. Mykiah reports following a vegetarian diet for >30 years; her primary protein sources are beans, dairy, etc. She is mindful of sodium. She is on ozempic for blood sugar control and recently stopped lantus; her A1c is 5.9. She does report blood sugar lows 2-3x/week in the middle of the night. She continues to follow-up with [facility] for kidney transplant. She is motivated to maintain her weight; however, she is down 2.2# since starting with our  program. Galilee will continue to benefit from participation in pulmonary rehab for nutrition and exercise support. Goal in progress. Patient has maintained weight since starting with our program. Gearl continues to grieve the unexpected death of her daughter. Eufemia reports following a vegetarian diet for >30 years; her primary protein sources are beans, dairy, etc. She is mindful of sodium. She is on ozempic for blood sugar control and recently stopped lantus; her A1c is 5.9. She does report blood sugar lows 2-3x/week in the middle of the night. She continues to follow-up with [facility] for kidney transplant. She is motivated to maintain her weight; however. Elverna will continue to benefit from participation in pulmonary rehab for nutrition and exercise support.              Nutrition Goals Discharge (Final Nutrition Goals Re-Evaluation):  Nutrition Goals Re-Evaluation - 08/09/23 1445       Goals   Current Weight 117 lb 11.6 oz (  53.4 kg)    Comment no new labs; most recent labs  Cr 3.1, GFR 16    Expected Outcome Goal in progress. Patient has maintained weight since starting with our program. Jahnyla continues to grieve the unexpected death of her daughter. Betzaida reports following a vegetarian diet for >30 years; her primary protein sources are beans, dairy, etc. She is mindful of sodium. She is on ozempic for blood sugar control and recently stopped lantus; her A1c is 5.9. She does report blood sugar lows 2-3x/week in the middle of the night. She continues to follow-up with [facility] for kidney transplant. She is motivated to maintain her weight; however. Samon will continue to benefit from participation in pulmonary rehab for nutrition and exercise support.             Psychosocial: Target Goals: Acknowledge presence or absence of significant depression and/or stress, maximize coping skills, provide positive support system. Participant is able to verbalize types and ability to use techniques and  skills needed for reducing stress and depression.  Initial Review & Psychosocial Screening:  Initial Psych Review & Screening - 06/03/23 1347       Initial Review   Current issues with Current Depression;History of Depression;Current Anxiety/Panic;Current Psychotropic Meds;Current Stress Concerns    Source of Stress Concerns Family;Chronic Illness    Comments Pt states she is worried about her husband who is currently residing in a skilled nursing facility. She stated he suffered a frontal lobe injury causing dementia. She also expressed his health is not good as he has suffered 2 heart attacks and strokes. She is also worried about her only child, her daughter, who she states is living on a LVAD and "isn't doing well". Williemae states her brother recently passed away from cardiac and kidney problems. She also worries about her cardiac health and is currently on the kidney transplant list.      Family Dynamics   Good Support System? Yes    Concerns Recent loss of significant other    Comments Kinya states she has a small family but has supportive friends      Barriers   Psychosocial barriers to participate in program The patient should benefit from training in stress management and relaxation.;Psychosocial barriers identified (see note)      Screening Interventions   Interventions Encouraged to exercise;To provide support and resources with identified psychosocial needs;Provide feedback about the scores to participant   Malaak stated she is currently on psychotropic meds and sees a therapist regularly. She declines any needs at the time.   Expected Outcomes Short Term goal: Utilizing psychosocial counselor, staff and physician to assist with identification of specific Stressors or current issues interfering with healing process. Setting desired goal for each stressor or current issue identified.;Long Term Goal: Stressors or current issues are controlled or eliminated.;Short Term goal:  Identification and review with participant of any Quality of Life or Depression concerns found by scoring the questionnaire.;Long Term goal: The participant improves quality of Life and PHQ9 Scores as seen by post scores and/or verbalization of changes             Quality of Life Scores:  Scores of 19 and below usually indicate a poorer quality of life in these areas.  A difference of  2-3 points is a clinically meaningful difference.  A difference of 2-3 points in the total score of the Quality of Life Index has been associated with significant improvement in overall quality of life, self-image, physical symptoms, and general  health in studies assessing change in quality of life.  PHQ-9: Review Flowsheet       06/03/2023  Depression screen PHQ 2/9  Decreased Interest 2  Down, Depressed, Hopeless 2  PHQ - 2 Score 4  Altered sleeping 1  Tired, decreased energy 2  Change in appetite 3  Feeling bad or failure about yourself  1  Trouble concentrating 1  Moving slowly or fidgety/restless 3  Suicidal thoughts 0  PHQ-9 Score 15  Difficult doing work/chores Somewhat difficult    Details           Interpretation of Total Score  Total Score Depression Severity:  1-4 = Minimal depression, 5-9 = Mild depression, 10-14 = Moderate depression, 15-19 = Moderately severe depression, 20-27 = Severe depression   Psychosocial Evaluation and Intervention:  Psychosocial Evaluation - 06/03/23 1352       Psychosocial Evaluation & Interventions   Interventions Encouraged to exercise with the program and follow exercise prescription;Relaxation education;Stress management education    Comments [name] stated she is currently on psychotropic meds and sees a therapist regularly. She declines any needs at the time.    Expected Outcomes For [name] to attend pulmonary rehab and to have a positive outlook and good coping skills to manage her stress. For [name] to be compliant in taking her psychotropic  meds and continue seeing her therapist regularly.    Continue Psychosocial Services  Follow up required by staff             Psychosocial Re-Evaluation:  Psychosocial Re-Evaluation     Row Name 06/10/23 1150 07/06/23 1512 08/05/23 0837         Psychosocial Re-Evaluation   Current issues with Current Stress Concerns;Current Psychotropic Meds;Current Anxiety/Panic;History of Depression;Current Depression Current Stress Concerns;Current Psychotropic Meds;Current Anxiety/Panic;History of Depression;Current Depression Current Depression;Current Psychotropic Meds;History of Depression;Current Stress Concerns     Comments No changes since [name] has started the program. [name] has completed 1 class so far. [name] has been through a lot recently. She tragically lost her daughter unexpectedly. She has been out of class the last 2 weeks. [name] called and stated she would be back to class after all funeral arrangements have been made and they lay her to rest. [name] is also concerned about her husband who resides in assisted living, and her own health. [name] has a small dog who is her companion and gives her emotional support. She also has close friends she relies on. [name] denies any needs currently. We will continue to monitor [name] and assess for any needs throughout the program. Felesia is having a hard time dealing with the loss of her daughter. She has been coming to class, but states it is a struggle to get out of bed most days. She states she has a good support system and her little dog provides her company. She does have a therapist who she has worked with in the past and stated she let them know about her daughters passing.     Expected Outcomes For [name] to participate in PR without any psychosocial barriers or concerns. For [name] to participate in PR without any psychosocial barriers or concerns. For [name] to continue to attend pulmonary rehab and to have a positive outlook and good coping  skills to manage her stress & anxiety, and decreased symptoms of depression. For [name] to participate in PR without any psychosocial barriers or concerns. For [name] to continue to attend pulmonary rehab and to have a positive outlook and good coping  skills to manage her stress & anxiety, and decreased symptoms of depression.     Interventions Encouraged to attend Pulmonary Rehabilitation for the exercise;Stress management education;Relaxation education Encouraged to attend Pulmonary Rehabilitation for the exercise;Stress management education;Relaxation education Encouraged to attend Pulmonary Rehabilitation for the exercise;Stress management education     Continue Psychosocial Services  Follow up required by staff Follow up required by staff Follow up required by staff              Psychosocial Discharge (Final Psychosocial Re-Evaluation):  Psychosocial Re-Evaluation - 08/05/23 0837       Psychosocial Re-Evaluation   Current issues with Current Depression;Current Psychotropic Meds;History of Depression;Current Stress Concerns    Comments [name] is having a hard time dealing with the loss of her daughter. She has been coming to class, but states it is a struggle to get out of bed most days. She states she has a good support system and her little dog provides her company. She does have a therapist who she has worked with in the past and stated she let them know about her daughters passing.    Expected Outcomes For [name] to participate in PR without any psychosocial barriers or concerns. For [name] to continue to attend pulmonary rehab and to have a positive outlook and good coping skills to manage her stress & anxiety, and decreased symptoms of depression.    Interventions Encouraged to attend Pulmonary Rehabilitation for the exercise;Stress management education    Continue Psychosocial Services  Follow up required by staff             Education: Education Goals: Education classes will  be provided on a weekly basis, covering required topics. Participant will state understanding/return demonstration of topics presented.  Learning Barriers/Preferences:  Learning Barriers/Preferences - 06/03/23 1542       Learning Barriers/Preferences   Learning Barriers None    Learning Preferences Group Instruction;Individual Instruction;Verbal Instruction;Written Material             Education Topics: Know Your Numbers Group instruction that is supported by a PowerPoint presentation. Instructor discusses importance of knowing and understanding resting, exercise, and post-exercise oxygen saturation, heart rate, and blood pressure. Oxygen saturation, heart rate, blood pressure, rating of perceived exertion, and dyspnea are reviewed along with a normal range for these values.    Exercise for the Pulmonary Patient Group instruction that is supported by a PowerPoint presentation. Instructor discusses benefits of exercise, core components of exercise, frequency, duration, and intensity of an exercise routine, importance of utilizing pulse oximetry during exercise, safety while exercising, and options of places to exercise outside of rehab.  Flowsheet Row PULMONARY REHAB OTHER RESPIRATORY from 08/04/2023 in [facility]  Date 08/04/23  Educator EP  Instruction Review Code 1- Verbalizes Understanding       MET Level  Group instruction provided by PowerPoint, verbal discussion, and written material to support subject matter. Instructor reviews what METs are and how to increase METs.    Pulmonary Medications Verbally interactive group education provided by instructor with focus on inhaled medications and proper administration. Flowsheet Row PULMONARY REHAB OTHER RESPIRATORY from 07/28/2023 in [facility]  Date 07/28/23  Educator RT  Instruction Review Code 1- Verbalizes  Understanding       Anatomy and Physiology of the Respiratory System Group instruction provided by PowerPoint, verbal discussion, and written material to support subject matter. Instructor reviews  respiratory cycle and anatomical components of the respiratory system and their functions. Instructor also reviews differences in obstructive and restrictive respiratory diseases with examples of each.  Flowsheet Row PULMONARY REHAB OTHER RESPIRATORY from 07/21/2023 in [facility], [facility]  Date 07/21/23  Educator Baird Lyons, RT  Instruction Review Code 1- Verbalizes Understanding       Oxygen Safety Group instruction provided by PowerPoint, verbal discussion, and written material to support subject matter. There is an overview of "What is Oxygen" and "Why do we need it".  Instructor also reviews how to create a safe environment for oxygen use, the importance of using oxygen as prescribed, and the risks of noncompliance. There is a brief discussion on traveling with oxygen and resources the patient may utilize.   Oxygen Use Group instruction provided by PowerPoint, verbal discussion, and written material to discuss how supplemental oxygen is prescribed and different types of oxygen supply systems. Resources for more information are provided.    Breathing Techniques Group instruction that is supported by demonstration and informational handouts. Instructor discusses the benefits of pursed lip and diaphragmatic breathing and detailed demonstration on how to perform both.     Risk Factor Reduction Group instruction that is supported by a PowerPoint presentation. Instructor discusses the definition of a risk factor, different risk factors for pulmonary disease, and how the heart and lungs work together.   Pulmonary Diseases Group instruction provided by PowerPoint, verbal discussion, and written material to support subject matter. Instructor gives an  overview of the different type of pulmonary diseases. There is also a discussion on risk factors and symptoms as well as ways to manage the diseases.   Stress and Energy Conservation Group instruction provided by PowerPoint, verbal discussion, and written material to support subject matter. Instructor gives an overview of stress and the impact it can have on the body. Instructor also reviews ways to reduce stress. There is also a discussion on energy conservation and ways to conserve energy throughout the day.   Warning Signs and Symptoms Group instruction provided by PowerPoint, verbal discussion, and written material to support subject matter. Instructor reviews warning signs and symptoms of stroke, heart attack, cold and flu. Instructor also reviews ways to prevent the spread of infection.   Other Education Group or individual verbal, written, or video instructions that support the educational goals of the pulmonary rehab program. Flowsheet Row PULMONARY REHAB OTHER RESPIRATORY from 06/23/2023 in [facility]  Date 06/23/23  Educator RN  Instruction Review Code 1- Verbalizes Understanding        Knowledge Questionnaire Score:  Knowledge Questionnaire Score - 06/03/23 1530       Knowledge Questionnaire Score   Pre Score 16/18             Core Components/Risk Factors/Patient Goals at Admission:  Personal Goals and Risk Factors at Admission - 06/03/23 1343       Core Components/Risk Factors/Patient Goals on Admission    Weight Management Yes;Weight Maintenance    Admit Weight 118 lb 2.7 oz (53.6 kg)    Improve shortness of breath with ADL's Yes    Intervention Provide education, individualized exercise plan and daily activity instruction to help decrease symptoms of SOB with activities of daily living.    Expected Outcomes Short Term: Improve cardiorespiratory fitness to achieve a reduction of symptoms when performing  ADLs;Long Term: Be able to perform more ADLs without symptoms or  delay the onset of symptoms    Increase knowledge of respiratory medications and ability to use respiratory devices properly  Yes    Intervention Provide education and demonstration as needed of appropriate use of medications, inhalers, and oxygen therapy.    Expected Outcomes Short Term: Achieves understanding of medications use. Understands that oxygen is a medication prescribed by physician. Demonstrates appropriate use of inhaler and oxygen therapy.;Long Term: Maintain appropriate use of medications, inhalers, and oxygen therapy.    Stress Yes    Intervention Offer individual and/or small group education and counseling on adjustment to heart disease, stress management and health-related lifestyle change. Teach and support self-help strategies.;Refer participants experiencing significant psychosocial distress to appropriate mental health specialists for further evaluation and treatment. When possible, include family members and significant others in education/counseling sessions.    Expected Outcomes Short Term: Participant demonstrates changes in health-related behavior, relaxation and other stress management skills, ability to obtain effective social support, and compliance with psychotropic medications if prescribed.;Long Term: Emotional wellbeing is indicated by absence of clinically significant psychosocial distress or social isolation.             Core Components/Risk Factors/Patient Goals Review:   Goals and Risk Factor Review     Row Name 06/10/23 1152 07/06/23 1517 08/05/23 0848         Core Components/Risk Factors/Patient Goals Review   Personal Goals Review Weight Management/Obesity;Improve shortness of breath with ADL's;Develop more efficient breathing techniques such as purse lipped breathing and diaphragmatic breathing and practicing self-pacing with activity.;Stress;Increase knowledge of respiratory medications  and ability to use respiratory devices properly. Weight Management/Obesity;Improve shortness of breath with ADL's;Develop more efficient breathing techniques such as purse lipped breathing and diaphragmatic breathing and practicing self-pacing with activity.;Stress;Increase knowledge of respiratory medications and ability to use respiratory devices properly. Weight Management/Obesity;Improve shortness of breath with ADL's     Review Unable to assess progress. [name] has completed 1 class so far. [name]'s goal of weight management is progressing. She is currently down ~2# since starting the program. She continues to work with our dietician to make healthy choices, increasing fruits and vegetables and reducing fats. Goal in progress on developing more efficient breathing techniques such as purse lipped breathing and diaphragmatic breathing; and practicing self-pacing with activity. We have been working & teaching [name] on how to perform PLB and pacing herself while walking on the treadmill. Goal in progress on improving her shortness of breath with ADLs. Londynn has maintained her oxygen saturation >88% while exercising on room air. Goal progressing for increasing her knowledge of respiratory medications and ability to use respiratory devices properly. She is working with our respiratory therapist on how to correctly and properly use her inhalers. Goal progressing for decreased stress. Emaline has been out for the last 2 weeks due to the unexpected loss of her daughter. We will work with her on stress relief through meditation and diaphragmatic breathing techniques. We will also be monitoring her and assessing her for any professional mental health resources and/or referrals. She will continue to benefit from PR for nutrition, education, exercise, and lifestyle modification. Lisamarie's goal of weight management is progressing. She is currently down ~2# since starting the program. She continues to work with our dietician  to make healthy choices, increasing fruits and vegetables and reducing fats. Goal met on developing more efficient breathing techniques such as purse lipped breathing and diaphragmatic breathing; and practicing self-pacing with activity. Jimenna is able to demonstrate purse lip breathing when she gets SOB.  She also knows how to pace herself while walking on the treadmill. Goal in progress on improving her shortness of breath with ADLs. Anala has maintained her oxygen saturation >88% while exercising on room air. Goal met for increasing her knowledge of respiratory medications and ability to use respiratory devices properly. She has demonstrated proper MDI technique with the respiratory therapist. Goal progressing for decreased stress. Jahnea is having a hard time dealing with the loss of her daughter. Between her husband's declining health and her daughters passing this brings alot of stress. We will continue to work with her on stress relief through meditation and diaphragmatic breathing techniques. We will also be monitoring her and assessing her for any professional mental health resources and/or referrals. She will continue to benefit from PR for nutrition, education, exercise, and lifestyle modification.     Expected Outcomes See admission goals For Genasis to maintain her weight, to improve her shortness of breath with ADLs, develop more efficient breathing techniques, increase her knowledge of respiratory meds, and decrease her stress & anxiety. For Maat to maintain her weight and to improve her shortness of breath with ADLs              Core Components/Risk Factors/Patient Goals at Discharge (Final Review):   Goals and Risk Factor Review - 08/05/23 0848       Core Components/Risk Factors/Patient Goals Review   Personal Goals Review Weight Management/Obesity;Improve shortness of breath with ADL's    Review Roseanna's goal of weight management is progressing. She is currently down ~2# since  starting the program. She continues to work with our dietician to make healthy choices, increasing fruits and vegetables and reducing fats. Goal met on developing more efficient breathing techniques such as purse lipped breathing and diaphragmatic breathing; and practicing self-pacing with activity. Celene is able to demonstrate purse lip breathing when she gets SOB. She also knows how to pace herself while walking on the treadmill. Goal in progress on improving her shortness of breath with ADLs. Laela has maintained her oxygen saturation >88% while exercising on room air. Goal met for increasing her knowledge of respiratory medications and ability to use respiratory devices properly. She has demonstrated proper MDI technique with the respiratory therapist. Goal progressing for decreased stress. Lalonnie is having a hard time dealing with the loss of her daughter. Between her husband's declining health and her daughters passing this brings alot of stress. We will continue to work with her on stress relief through meditation and diaphragmatic breathing techniques. We will also be monitoring her and assessing her for any professional mental health resources and/or referrals. She will continue to benefit from PR for nutrition, education, exercise, and lifestyle modification.    Expected Outcomes For Nimisha to maintain her weight and to improve her shortness of breath with ADLs             ITP Comments:Pt is making expected progress toward Pulmonary Rehab goals after completing 12 sessions. Recommend continued exercise, life style modification, education, and utilization of breathing techniques to increase stamina and strength, while also decreasing shortness of breath with exertion.  Dr. Mechele Collin is Medical Director for Pulmonary Rehab at [facility].     Comments: Dr. Mechele Collin is Medical Director for Pulmonary Rehab at [facility].

## 2023-08-11 ENCOUNTER — Encounter (HOSPITAL_COMMUNITY)
Admission: RE | Admit: 2023-08-11 | Discharge: 2023-08-11 | Disposition: A | Discharge status: Home / Self Care | Payer: Medicare HMO | Source: Ambulatory Visit | Attending: Cardiovascular Disease | Admitting: Cardiovascular Disease

## 2023-08-11 ENCOUNTER — Encounter (HOSPITAL_COMMUNITY): Payer: Medicare HMO

## 2023-08-11 DIAGNOSIS — I5032 Chronic diastolic (congestive) heart failure: Secondary | ICD-10-CM | POA: Diagnosis not present

## 2023-08-11 DIAGNOSIS — R079 Chest pain, unspecified: Secondary | ICD-10-CM | POA: Diagnosis not present

## 2023-08-11 NOTE — Progress Notes (Signed)
Daily Session Note  Patient Details  Name: [name] MRN: [id number] Date of Birth: 12-May-1951 Referring Provider:   Doristine Devoid Pulmonary Rehab Walk Test from 06/03/2023 in [facility]  Referring Provider [name]       Encounter Date: 08/11/2023  Check In:  Session Check In - 08/11/23 1335       Check-In   Supervising physician immediately available to respond to emergencies [facility] MD immediately available    Physician(s) Joni Reining    Location [facility]    Staff Present Raford Pitcher, MS, ACSM-CEP, Exercise Physiologist;Randi Dionisio Paschal, ACSM-CEP, Exercise Physiologist;Carlos Quackenbush Gerre Scull, RN, [name] Plan, RT    Virtual Visit No    Medication changes reported     No    Fall or balance concerns reported    No    Tobacco Cessation No Change    Warm-up and Cool-down Performed as group-led instruction    Resistance Training Performed Yes    VAD Patient? No    PAD/SET Patient? No      Pain Assessment   Currently in Pain? No/denies    Multiple Pain Sites No             Capillary Blood Glucose: No results found for this or any previous visit (from the past 24 hour(s)).    Social History   Tobacco Use  Smoking Status Never  Smokeless Tobacco Never    Goals Met:  Independence with exercise equipment Exercise tolerated well No report of concerns or symptoms today Strength training completed today  Goals Unmet:  Not Applicable  Comments: Service time is from 1325 to 1456    Dr. Mechele Collin is Medical Director for Pulmonary Rehab at [facility].

## 2023-08-16 ENCOUNTER — Encounter (HOSPITAL_COMMUNITY): Payer: Medicare HMO

## 2023-08-18 ENCOUNTER — Encounter (HOSPITAL_COMMUNITY): Payer: Medicare HMO

## 2023-08-18 ENCOUNTER — Telehealth (HOSPITAL_COMMUNITY): Payer: Self-pay

## 2023-08-18 ENCOUNTER — Encounter (HOSPITAL_COMMUNITY)
Admission: RE | Admit: 2023-08-18 | Discharge: 2023-08-18 | Disposition: A | Discharge status: Home / Self Care | Payer: Medicare HMO | Source: Ambulatory Visit | Attending: Nephrology | Admitting: Nephrology

## 2023-08-18 VITALS — BP 136/76 | HR 62 | Temp 97.8°F | Resp 17

## 2023-08-18 DIAGNOSIS — N183 Chronic kidney disease, stage 3 unspecified: Secondary | ICD-10-CM | POA: Diagnosis not present

## 2023-08-18 DIAGNOSIS — H35033 Hypertensive retinopathy, bilateral: Secondary | ICD-10-CM | POA: Diagnosis not present

## 2023-08-18 DIAGNOSIS — H04123 Dry eye syndrome of bilateral lacrimal glands: Secondary | ICD-10-CM | POA: Diagnosis not present

## 2023-08-18 DIAGNOSIS — E119 Type 2 diabetes mellitus without complications: Secondary | ICD-10-CM | POA: Diagnosis not present

## 2023-08-18 DIAGNOSIS — Z961 Presence of intraocular lens: Secondary | ICD-10-CM | POA: Diagnosis not present

## 2023-08-18 LAB — POCT HEMOGLOBIN-HEMACUE: Hemoglobin: 9.9 g/dL — ABNORMAL LOW (ref 12.0–15.0)

## 2023-08-18 MED ORDER — EPOETIN ALFA-EPBX 10000 UNIT/ML IJ SOLN: 10000.0000 [IU] | INTRAMUSCULAR | Status: DC

## 2023-08-18 MED ORDER — EPOETIN ALFA-EPBX 10000 UNIT/ML IJ SOLN
INTRAMUSCULAR | Status: AC
  Administered 2023-08-18: 10000 [IU] via SUBCUTANEOUS
  Filled 2023-08-18: qty 1

## 2023-08-18 NOTE — Telephone Encounter (Signed)
[name] called and started her infusions today. She is not going to come to make it to class today. I have canceled her appointment!

## 2023-08-23 ENCOUNTER — Telehealth: Payer: Self-pay

## 2023-08-23 ENCOUNTER — Encounter (HOSPITAL_COMMUNITY): Payer: Self-pay

## 2023-08-23 ENCOUNTER — Encounter (HOSPITAL_COMMUNITY)
Admission: RE | Admit: 2023-08-23 | Discharge: 2023-08-23 | Disposition: A | Discharge status: Home / Self Care | Payer: Medicare HMO | Source: Ambulatory Visit | Attending: Cardiovascular Disease

## 2023-08-23 ENCOUNTER — Telehealth: Payer: Self-pay | Admitting: Nurse Practitioner

## 2023-08-23 ENCOUNTER — Encounter (HOSPITAL_COMMUNITY): Payer: Medicare HMO

## 2023-08-23 ENCOUNTER — Ambulatory Visit: Payer: Medicare HMO

## 2023-08-23 VITALS — Wt 117.3 lb

## 2023-08-23 DIAGNOSIS — R079 Chest pain, unspecified: Secondary | ICD-10-CM

## 2023-08-23 DIAGNOSIS — I5032 Chronic diastolic (congestive) heart failure: Secondary | ICD-10-CM | POA: Diagnosis not present

## 2023-08-23 NOTE — Progress Notes (Signed)
Incomplete Session Note  Patient Details  Name: [name] MRN: [id number] Date of Birth: 01-24-1951 Referring Provider:   Doristine Devoid Pulmonary Rehab Walk Test from 06/03/2023 in [facility]  Referring Provider Rico Junker T Remington did not complete her rehab session. While patient exercising on the treadmill c/o 10/10 chest pain described as a "jolt" of pain, then subsided to chest pressure of 3-4/10. 12 lead captured, Eligha Bridegroom, NP on site informed and came to assess patient. VSS. Pt cleared to leave, VSS, pt will f/u with cards.  Pre-exercise VS @ 1326: 122/70, HR 76, 98% O2 on RA  During CP/pressure @ 1405: VS 130/80, HR 78, 100% on RA  Exit VS @ 1437: 124/70, HR 74, 100% RA

## 2023-08-23 NOTE — Telephone Encounter (Signed)
Transmission received 08/23/2023.

## 2023-08-23 NOTE — Telephone Encounter (Signed)
Transmission received.  No episodes noted.  Device function WNL.  4 months left on battery.  Returned call to Pt.  Advised of normal transmission.

## 2023-08-23 NOTE — Telephone Encounter (Signed)
Was in cardiac/pulm rehab today and patient reported a "jolt" that she rated 10/10 on pain scale. 12 lead EKG was unremarkable. I reviewed her chart and realized that Renee had indicated monthly device checks for this patient when she saw her in the office on 8/16. That was the most recent device check. Would you please follow-up to ensure no abnormalities on device interrogation as she is planning to continue in rehab.   Thank you, Marcelino Duster

## 2023-08-23 NOTE — Telephone Encounter (Signed)
LM requesting call back.  Pt will need to send manual transmission for review.

## 2023-08-24 LAB — CUP PACEART REMOTE DEVICE CHECK
Battery Remaining Longevity: 4 mo
Battery Voltage: 2.82 V
Brady Statistic AP VP Percent: 0.05 %
Brady Statistic AP VS Percent: 0.04 %
Brady Statistic AS VP Percent: 98.51 %
Brady Statistic AS VS Percent: 1.41 %
Brady Statistic RA Percent Paced: 0.09 %
Brady Statistic RV Percent Paced: 0.03 %
Date Time Interrogation Session: 20241015170100
HighPow Impedance: 38 Ohm
HighPow Impedance: 53 Ohm
Implantable Lead Connection Status: 753985
Implantable Lead Connection Status: 753985
Implantable Lead Connection Status: 753985
Implantable Lead Implant Date: 20090610
Implantable Lead Implant Date: 20090610
Implantable Lead Implant Date: 20090610
Implantable Lead Location: 753858
Implantable Lead Location: 753859
Implantable Lead Location: 753860
Implantable Lead Model: 4194
Implantable Lead Model: 5076
Implantable Lead Model: 6947
Implantable Pulse Generator Implant Date: 20160323
Lead Channel Impedance Value: 247 Ohm
Lead Channel Impedance Value: 342 Ohm
Lead Channel Impedance Value: 456 Ohm
Lead Channel Impedance Value: 475 Ohm
Lead Channel Impedance Value: 513 Ohm
Lead Channel Impedance Value: 646 Ohm
Lead Channel Pacing Threshold Amplitude: 0.75 V
Lead Channel Pacing Threshold Amplitude: 0.875 V
Lead Channel Pacing Threshold Amplitude: 1 V
Lead Channel Pacing Threshold Pulse Width: 0.4 ms
Lead Channel Pacing Threshold Pulse Width: 0.4 ms
Lead Channel Pacing Threshold Pulse Width: 0.4 ms
Lead Channel Sensing Intrinsic Amplitude: 2.375 mV
Lead Channel Sensing Intrinsic Amplitude: 2.375 mV
Lead Channel Sensing Intrinsic Amplitude: 5.375 mV
Lead Channel Sensing Intrinsic Amplitude: 5.375 mV
Lead Channel Setting Pacing Amplitude: 1.75 V
Lead Channel Setting Pacing Amplitude: 2 V
Lead Channel Setting Pacing Amplitude: 2 V
Lead Channel Setting Pacing Pulse Width: 0.4 ms
Lead Channel Setting Pacing Pulse Width: 0.4 ms
Lead Channel Setting Sensing Sensitivity: 0.3 mV
Zone Setting Status: 755011
Zone Setting Status: 755011

## 2023-08-25 ENCOUNTER — Encounter (HOSPITAL_COMMUNITY): Payer: Medicare HMO

## 2023-08-25 ENCOUNTER — Telehealth (HOSPITAL_COMMUNITY): Payer: Self-pay

## 2023-08-25 ENCOUNTER — Ambulatory Visit (INDEPENDENT_AMBULATORY_CARE_PROVIDER_SITE_OTHER): Payer: Medicare HMO

## 2023-08-25 DIAGNOSIS — F331 Major depressive disorder, recurrent, moderate: Secondary | ICD-10-CM | POA: Diagnosis not present

## 2023-08-25 DIAGNOSIS — F411 Generalized anxiety disorder: Secondary | ICD-10-CM | POA: Diagnosis not present

## 2023-08-25 DIAGNOSIS — I5022 Chronic systolic (congestive) heart failure: Secondary | ICD-10-CM

## 2023-08-25 DIAGNOSIS — I255 Ischemic cardiomyopathy: Secondary | ICD-10-CM

## 2023-08-25 LAB — CUP PACEART REMOTE DEVICE CHECK
Battery Remaining Longevity: 4 mo
Battery Voltage: 2.82 V
Brady Statistic AP VP Percent: 0.17 %
Brady Statistic AP VS Percent: 0.04 %
Brady Statistic AS VP Percent: 98.39 %
Brady Statistic AS VS Percent: 1.4 %
Brady Statistic RA Percent Paced: 0.21 %
Brady Statistic RV Percent Paced: 0.04 %
Date Time Interrogation Session: 20241017012403
HighPow Impedance: 37 Ohm
HighPow Impedance: 51 Ohm
Implantable Lead Connection Status: 753985
Implantable Lead Connection Status: 753985
Implantable Lead Connection Status: 753985
Implantable Lead Implant Date: 20090610
Implantable Lead Implant Date: 20090610
Implantable Lead Implant Date: 20090610
Implantable Lead Location: 753858
Implantable Lead Location: 753859
Implantable Lead Location: 753860
Implantable Lead Model: 4194
Implantable Lead Model: 5076
Implantable Lead Model: 6947
Implantable Pulse Generator Implant Date: 20160323
Lead Channel Impedance Value: 247 Ohm
Lead Channel Impedance Value: 361 Ohm
Lead Channel Impedance Value: 456 Ohm
Lead Channel Impedance Value: 456 Ohm
Lead Channel Impedance Value: 513 Ohm
Lead Channel Impedance Value: 646 Ohm
Lead Channel Pacing Threshold Amplitude: 0.75 V
Lead Channel Pacing Threshold Amplitude: 0.875 V
Lead Channel Pacing Threshold Amplitude: 1 V
Lead Channel Pacing Threshold Pulse Width: 0.4 ms
Lead Channel Pacing Threshold Pulse Width: 0.4 ms
Lead Channel Pacing Threshold Pulse Width: 0.4 ms
Lead Channel Sensing Intrinsic Amplitude: 2.375 mV
Lead Channel Sensing Intrinsic Amplitude: 2.375 mV
Lead Channel Sensing Intrinsic Amplitude: 5.375 mV
Lead Channel Sensing Intrinsic Amplitude: 5.375 mV
Lead Channel Setting Pacing Amplitude: 1.75 V
Lead Channel Setting Pacing Amplitude: 2 V
Lead Channel Setting Pacing Amplitude: 2 V
Lead Channel Setting Pacing Pulse Width: 0.4 ms
Lead Channel Setting Pacing Pulse Width: 0.4 ms
Lead Channel Setting Sensing Sensitivity: 0.3 mV
Zone Setting Status: 755011
Zone Setting Status: 755011

## 2023-08-25 NOTE — Telephone Encounter (Signed)
Pt called and stated she will not be able to attend PR today due to having another doctor appt.

## 2023-08-30 ENCOUNTER — Encounter (HOSPITAL_COMMUNITY): Payer: Medicare HMO

## 2023-08-30 ENCOUNTER — Encounter (HOSPITAL_COMMUNITY): Admission: RE | Admit: 2023-08-30 | Payer: Medicare HMO | Source: Ambulatory Visit

## 2023-08-31 DIAGNOSIS — F411 Generalized anxiety disorder: Secondary | ICD-10-CM | POA: Diagnosis not present

## 2023-08-31 DIAGNOSIS — F331 Major depressive disorder, recurrent, moderate: Secondary | ICD-10-CM | POA: Diagnosis not present

## 2023-09-01 ENCOUNTER — Encounter (HOSPITAL_COMMUNITY): Payer: Medicare HMO

## 2023-09-02 ENCOUNTER — Telehealth (HOSPITAL_COMMUNITY): Payer: Self-pay

## 2023-09-02 NOTE — Telephone Encounter (Signed)
RN called to check on patient after she missed this week of Pulm Rehab. LVM, awaiting call back.

## 2023-09-05 DIAGNOSIS — F411 Generalized anxiety disorder: Secondary | ICD-10-CM | POA: Diagnosis not present

## 2023-09-05 DIAGNOSIS — F331 Major depressive disorder, recurrent, moderate: Secondary | ICD-10-CM | POA: Diagnosis not present

## 2023-09-06 ENCOUNTER — Encounter (HOSPITAL_COMMUNITY)
Admission: RE | Admit: 2023-09-06 | Discharge: 2023-09-06 | Disposition: A | Discharge status: Home / Self Care | Payer: Medicare HMO | Source: Ambulatory Visit | Attending: Cardiovascular Disease | Admitting: Cardiovascular Disease

## 2023-09-06 VITALS — Wt 116.4 lb

## 2023-09-06 DIAGNOSIS — R079 Chest pain, unspecified: Secondary | ICD-10-CM | POA: Diagnosis not present

## 2023-09-06 DIAGNOSIS — I5032 Chronic diastolic (congestive) heart failure: Secondary | ICD-10-CM | POA: Diagnosis not present

## 2023-09-06 NOTE — Progress Notes (Signed)
Daily Session Note  Patient Details  Name: [name] MRN: [id number] Date of Birth: 1951/04/27 Referring Provider:   Doristine Devoid Pulmonary Rehab Walk Test from 06/03/2023 in [facility], [facility]  Referring Provider Allyson Sabal       Encounter Date: 09/06/2023  Check In:  Session Check In - 09/06/23 1425       Check-In   Supervising physician immediately available to respond to emergencies [facility] MD immediately available    Physician(s) Robin Searing, NP    Location [facility]    Staff Present Essie Hart, RN, BSN;Demitra Danley Katrinka Blazing, Zella Richer, MS, ACSM-CEP, Exercise Physiologist;Samantha Belarus, RD, LDN;Johnny Hale Bogus, MS, Exercise Physiologist    Virtual Visit No    Medication changes reported     No    Fall or balance concerns reported    No    Tobacco Cessation No Change    Warm-up and Cool-down Performed as group-led instruction    Resistance Training Performed Yes    VAD Patient? No    PAD/SET Patient? No      Pain Assessment   Currently in Pain? No/denies    Multiple Pain Sites No             Capillary Blood Glucose: No results found for this or any previous visit (from the past 24 hour(s)).   Exercise Prescription Changes - 09/06/23 1500       Response to Exercise   Blood Pressure (Admit) 110/64    Blood Pressure (Exercise) 104/60    Blood Pressure (Exit) 110/64    Heart Rate (Admit) 80 bpm    Heart Rate (Exercise) 88 bpm    Heart Rate (Exit) 74 bpm    Oxygen Saturation (Admit) 99 %    Oxygen Saturation (Exercise) 100 %    Oxygen Saturation (Exit) 99 %    Rating of Perceived Exertion (Exercise) 12    Perceived Dyspnea (Exercise) 1    Duration Continue with 30 min of aerobic exercise without signs/symptoms of physical distress.    Intensity THRR unchanged      Progression   Progression Continue to progress workloads to maintain intensity without signs/symptoms of physical distress.       Resistance Training   Training Prescription Yes    Weight blue bands    Reps 10-15    Time 10 Minutes      Interval Training   Interval Training No      Treadmill   MPH 2.5    Grade 1.5    Minutes 15    METs 3.1      Bike   Level 3.6    Minutes 15    METs 3.6             Social History   Tobacco Use  Smoking Status Never  Smokeless Tobacco Never    Goals Met:  Proper associated with RPD/PD & O2 Sat Independence with exercise equipment Exercise tolerated well No report of concerns or symptoms today Strength training completed today  Goals Unmet:  Not Applicable  Comments: Service time is from 1335 to 1440.    Dr. Mechele Collin is Medical Director for Pulmonary Rehab at [facility].

## 2023-09-07 NOTE — Progress Notes (Signed)
Pulmonary Individual Treatment Plan  Patient Details  Name: [name] MRN: [id number] Date of Birth: September 23, 1951 Referring Provider:   Doristine Devoid Pulmonary Rehab Walk Test from 06/03/2023 in [facility]  Referring Provider Allyson Sabal       Initial Encounter Date:  Flowsheet Row Pulmonary Rehab Walk Test from 06/03/2023 in [facility]  Date 06/03/23       Visit Diagnosis: Heart failure, diastolic, chronic (HCC)  Patient's Home Medications on Admission:   Current Outpatient Medications:    albuterol (PROVENTIL HFA;VENTOLIN HFA) 108 (90 BASE) MCG/ACT inhaler, Inhale 2 puffs into the lungs every 4 (four) hours as needed for wheezing or shortness of breath., Disp: , Rfl:    Alcohol Swabs (B-D SINGLE USE SWABS REGULAR) PADS, , Disp: , Rfl:    ALPRAZolam (XANAX) 1 MG tablet, Take 1 mg by mouth 3 (three) times daily. , Disp: , Rfl: 2   Artificial Tear GEL, Place 2 drops into both eyes daily as needed (dry eyes). , Disp: , Rfl:    aspirin 325 MG EC tablet, Take 325 mg by mouth daily., Disp: , Rfl:    benzonatate (TESSALON) 100 MG capsule, Take 1 capsule (100 mg total) by mouth 3 (three) times daily as needed for cough., Disp: 30 capsule, Rfl: 0   BIOTIN PO, Take 1 tablet by mouth daily., Disp: , Rfl:    cetirizine (ZYRTEC) 10 MG tablet, Take 10 mg by mouth daily., Disp: , Rfl:    Cholecalciferol (VITAMIN D-3) 1000 UNITS CAPS, Take 1,000 Units by mouth daily. , Disp: , Rfl:    clopidogrel (PLAVIX) 75 MG tablet, TAKE 1 TABLET EVERY DAY, Disp: 90 tablet, Rfl: 3   cyclobenzaprine (FLEXERIL) 5 MG tablet, Take 1 tablet (5 mg total) by mouth at bedtime as needed for muscle spasms., Disp: 30 tablet, Rfl: 0   Docusate Sodium (DSS) 250 MG CAPS, Take 1 capsule by mouth as needed., Disp: , Rfl:    doxazosin (CARDURA) 4 MG tablet, Take 4 mg by mouth at bedtime. , Disp: , Rfl:     DROPLET PEN NEEDLES 32G X 4 MM MISC, , Disp: , Rfl:    EPINEPHrine 0.3 mg/0.3 mL IJ SOAJ injection, Inject 0.3 mg into the muscle as needed for anaphylaxis. , Disp: , Rfl: 3   ezetimibe (ZETIA) 10 MG tablet, TAKE 1 TABLET EVERY DAY, Disp: 90 tablet, Rfl: 3   Flaxseed, Linseed, (FLAXSEED OIL) 1200 MG CAPS, Take 1,200 mg by mouth daily., Disp: , Rfl:    fluticasone (FLONASE) 50 MCG/ACT nasal spray, Place 2 sprays into both nostrils daily as needed for allergies or rhinitis., Disp: , Rfl:    furosemide (LASIX) 80 MG tablet, TAKE 1 TABLET TWICE A DAY. MAY TAKE AN ADDITIONAL 80MG  (1 TABLET) AS NEEDED FOR EDEMA, Disp: 270 tablet, Rfl: 3   hydrALAZINE (APRESOLINE) 25 MG tablet, TAKE 1 TABLET TWICE DAILY, Disp: 180 tablet, Rfl: 3   isosorbide mononitrate (IMDUR) 60 MG 24 hr tablet, TAKE 1 TABLET EVERY DAY, Disp: 90 tablet, Rfl: 3   Ketotifen Fumarate (ALAWAY OP), Place 1 drop into both eyes daily as needed (allergies). , Disp: , Rfl:    LANTUS SOLOSTAR 100 UNIT/ML Solostar Pen, Inject 6 Units into the skin at bedtime., Disp: , Rfl:    metoprolol succinate (TOPROL-XL) 100 MG 24 hr tablet, TAKE 2 TABLETS EVERY DAY WITH OR IMMEDIATELY FOLLOWING A MEAL,  Disp: 180 tablet, Rfl: 3   Multiple Vitamin (MULTIVITAMIN WITH MINERALS) TABS, Take 1 tablet by mouth daily., Disp: , Rfl:    nitroGLYCERIN (NITROSTAT) 0.4 MG SL tablet, Place 1 tablet under the tongue every 5 minutes as needed for chest pain, max 3 doses, go to er if no relief, Disp: 75 tablet, Rfl: 2   olmesartan (BENICAR) 40 MG tablet, TAKE 1 TABLET EVERY DAY, Disp: 90 tablet, Rfl: 3   ondansetron (ZOFRAN) 8 MG tablet, Take 8 mg by mouth every 8 (eight) hours as needed for nausea or vomiting., Disp: , Rfl:    OZEMPIC, 0.25 OR 0.5 MG/DOSE, 2 MG/1.5ML SOPN, Inject 0.25 mg into the skin once a week. Sunday, Disp: , Rfl:    pantoprazole (PROTONIX) 40 MG tablet, TAKE 1 TABLET BY MOUTH  DAILY, Disp: 90 tablet, Rfl: 3   potassium chloride (KLOR-CON M) 10 MEQ tablet,  TAKE 2 TABLETS TWICE A DAY (KEEP MD APPOINTMENT FOR REFILLS), Disp: 360 tablet, Rfl: 3   simvastatin (ZOCOR) 40 MG tablet, TAKE 1 TABLET (40 MG TOTAL) BY MOUTH AT BEDTIME., Disp: 90 tablet, Rfl: 3   sodium chloride (MURO 128) 5 % ophthalmic ointment, Place 1 drop into both eyes at bedtime. For dry eyes, Disp: , Rfl:    traMADol (ULTRAM) 50 MG tablet, Take 100 mg by mouth every 6 (six) hours as needed (MIGRAINES)., Disp: , Rfl:    TRUE METRIX BLOOD GLUCOSE TEST test strip, , Disp: , Rfl:    TRUEplus Lancets 33G MISC, , Disp: , Rfl:    venlafaxine (EFFEXOR) 100 MG tablet, Take 100-200 mg by mouth See admin instructions. TAKE 200 mg by mouth in the morning and take 100 mg at noon, Disp: , Rfl:    vitamin A 7500 UNIT capsule, Take 2,400 Units by mouth daily., Disp: , Rfl:   Past Medical History: Past Medical History:  Diagnosis Date   AICD (automatic cardioverter/defibrillator) present    [employer]- Dr. Rosette Reveal follows   Anginal pain (HCC)    Anxiety    Asthma    Back pain    "pinched nerve-lower back" - Dr. Ethelene Hal follows.   Biventricular implantable cardioverter-defibrillator in situ 06/18/2011   Cerebral infarction (HCC) 11/11/2012   Cervical dysplasia    Chronic diastolic heart failure (HCC) 03/22/2016   Chronic kidney disease    Dr. Allena Katz follows.   Chronic renal insufficiency, stage III (moderate) (HCC) 11/11/2012   Complication of anesthesia    "I wake up during surgeries" (02/14/2013)   Coronary artery disease involving coronary bypass graft of native heart with angina pectoris (HCC) 06/18/2011   Depression    DM (diabetes mellitus) (HCC) 11/11/2012   Fibroid    Function kidney decreased    GERD (gastroesophageal reflux disease)    Hemiplegia, unspecified, affecting nondominant side 11/11/2012   Hepatitis    Hepatitis A -college yrs"water source exposure"   Hiatal hernia    History of shingles    2-3 yrs ago last out break "around waist"   History of stomach ulcers    Hyperlipidemia  07/30/2016   Hypertension    ICD (implantable cardiac defibrillator) in place    Iron deficiency anemia    Ischemic cardiomyopathy    status post biventricular ICD placed by DR Edumunds who used to see Dr Elsie Lincoln here to establish  cardiovascular care.   Migraines    MVP (mitral valve prolapse)    Antibiotics not required for procedures   Myocardial infarction (HCC)    "  I've had 2; the others they were able to catch before completing" (02/14/2013)   Pacemaker    Paroxysmal SVT (supraventricular tachycardia) (HCC) 06/18/2011   Pneumonia 1950's & 1985   Shortness of breath    "lying down flat; at times w/exertion" (02/14/2013). 10-06-15 exertion only..   Stroke [facility])    "2 confirmed; 9 TIA's; results in dragging LLE; numbness in tip of tongue" (02/14/2013),10-06-15 right hand tends to be weaker when tired.   TIA (transient ischemic attack) 11/11/2012   Type II diabetes mellitus (HCC)     Tobacco Use: Social History   Tobacco Use  Smoking Status Never  Smokeless Tobacco Never    Labs: Review Flowsheet  More data exists      Latest Ref Rng & Units 01/03/2015 01/01/2016 01/02/2016 08/15/2018 04/06/2019  Labs for ITP Cardiac and Pulmonary Rehab  Cholestrol 100 - 199 mg/dL 784  - 696  295  284   LDL (calc) 0 - 99 mg/dL 70  - 66  72  92   HDL-C >39 mg/dL 54  - 41  49  61   Trlycerides 0 - 149 mg/dL 132  - 440  102  725   Hemoglobin A1c 4.8 - 5.6 % - - 6.9  - 6.5   TCO2 0 - 100 mmol/L - 25  - - -    Details            Capillary Blood Glucose: Lab Results  Component Value Date   GLUCAP 140 (H) 06/14/2023   GLUCAP 157 (H) 06/14/2023   GLUCAP 122 (H) 06/09/2023   GLUCAP 105 (H) 06/09/2023   GLUCAP 134 (H) 08/05/2018    POCT Glucose     Row Name 06/03/23 1337             POCT Blood Glucose   Pre-Exercise 128 mg/dL                Pulmonary Assessment Scores:  Pulmonary Assessment Scores     Row Name 06/03/23 1358         ADL UCSD   ADL Phase Entry     SOB Score  total 47       CAT Score   CAT Score 18       mMRC Score   mMRC Score 3             UCSD: Self-administered rating of dyspnea associated with activities of daily living (ADLs) 6-point scale (0 = "not at all" to 5 = "maximal or unable to do because of breathlessness")  Scoring Scores range from 0 to 120.  Minimally important difference is 5 units  CAT: CAT can identify the health impairment of COPD patients and is better correlated with disease progression.  CAT has a scoring range of zero to 40. The CAT score is classified into four groups of low (less than 10), medium (10 - 20), high (21-30) and very high (31-40) based on the impact level of disease on health status. A CAT score over 10 suggests significant symptoms.  A worsening CAT score could be explained by an exacerbation, poor medication adherence, poor inhaler technique, or progression of COPD or comorbid conditions.  CAT MCID is 2 points  mMRC: mMRC (Modified Medical Research Council) Dyspnea Scale is used to assess the degree of baseline functional disability in patients of respiratory disease due to dyspnea. No minimal important difference is established. A decrease in score of 1 point or greater is considered a positive change.  Pulmonary Function Assessment:  Pulmonary Function Assessment - 06/03/23 1340       Breath   Bilateral Breath Sounds Clear    Shortness of Breath Yes             Exercise Target Goals: Exercise Program Goal: Individual exercise prescription set using results from initial 6 min walk test and THRR while considering  patient's activity barriers and safety.   Exercise Prescription Goal: Initial exercise prescription builds to 30-45 minutes a day of aerobic activity, 2-3 days per week.  Home exercise guidelines will be given to patient during program as part of exercise prescription that the participant will acknowledge.  Activity Barriers & Risk Stratification:  Activity Barriers &  Cardiac Risk Stratification - 06/03/23 1339       Activity Barriers & Cardiac Risk Stratification   Activity Barriers Deconditioning;Muscular Weakness;Shortness of Breath;Balance Concerns             6 Minute Walk:  6 Minute Walk     Row Name 06/03/23 1545         6 Minute Walk   Phase Initial     Distance 1172 feet     Walk Time 6 minutes     # of Rest Breaks 0     MPH 2.22     METS 2.87     RPE 13     Perceived Dyspnea  1.5     VO2 Peak 10.03     Symptoms No     Resting HR 80 bpm     Resting BP 122/64     Resting Oxygen Saturation  99 %     Exercise Oxygen Saturation  during 6 min walk 94 %     Max Ex. HR 93 bpm     Max Ex. BP 118/70     2 Minute Post BP 114/68       Interval HR   1 Minute HR 83     2 Minute HR 83     3 Minute HR 80     4 Minute HR 87     5 Minute HR 92     6 Minute HR 93     2 Minute Post HR 73     Interval Heart Rate? Yes       Interval Oxygen   Interval Oxygen? Yes     Baseline Oxygen Saturation % 99 %     1 Minute Oxygen Saturation % 97 %     1 Minute Liters of Oxygen 0 L     2 Minute Oxygen Saturation % 94 %     2 Minute Liters of Oxygen 0 L     3 Minute Oxygen Saturation % 100 %     3 Minute Liters of Oxygen 0 L     4 Minute Oxygen Saturation % 100 %     4 Minute Liters of Oxygen 0 L     5 Minute Oxygen Saturation % 99 %     5 Minute Liters of Oxygen 0 L     6 Minute Oxygen Saturation % 100 %     6 Minute Liters of Oxygen 0 L     2 Minute Post Oxygen Saturation % 100 %     2 Minute Post Liters of Oxygen 0 L              Oxygen Initial Assessment:  Oxygen Initial Assessment - 06/03/23 1340       Home  Oxygen   Home Oxygen Device None    Sleep Oxygen Prescription None    Home Exercise Oxygen Prescription None    Home Resting Oxygen Prescription None      Initial 6 min Walk   Oxygen Used None      Program Oxygen Prescription   Program Oxygen Prescription None      Intervention   Short Term Goals To learn and  understand importance of maintaining oxygen saturations>88%;To learn and demonstrate proper use of respiratory medications;To learn and understand importance of monitoring SPO2 with pulse oximeter and demonstrate accurate use of the pulse oximeter.;To learn and demonstrate proper pursed lip breathing techniques or other breathing techniques.     Long  Term Goals Maintenance of O2 saturations>88%;Compliance with respiratory medication;Verbalizes importance of monitoring SPO2 with pulse oximeter and return demonstration;Exhibits proper breathing techniques, such as pursed lip breathing or other method taught during program session;Demonstrates proper use of MDI's             Oxygen Re-Evaluation:  Oxygen Re-Evaluation     Row Name 06/09/23 0905 07/05/23 0926 08/02/23 0840 09/05/23 0956       Program Oxygen Prescription   Program Oxygen Prescription None None None None      Home Oxygen   Home Oxygen Device None None None None    Sleep Oxygen Prescription None None None None    Home Exercise Oxygen Prescription None None None None    Home Resting Oxygen Prescription None None None None      Goals/Expected Outcomes   Short Term Goals To learn and understand importance of maintaining oxygen saturations>88%;To learn and demonstrate proper use of respiratory medications;To learn and understand importance of monitoring SPO2 with pulse oximeter and demonstrate accurate use of the pulse oximeter.;To learn and demonstrate proper pursed lip breathing techniques or other breathing techniques.  To learn and understand importance of maintaining oxygen saturations>88%;To learn and demonstrate proper use of respiratory medications;To learn and understand importance of monitoring SPO2 with pulse oximeter and demonstrate accurate use of the pulse oximeter.;To learn and demonstrate proper pursed lip breathing techniques or other breathing techniques.  To learn and understand importance of maintaining oxygen  saturations>88%;To learn and demonstrate proper use of respiratory medications;To learn and understand importance of monitoring SPO2 with pulse oximeter and demonstrate accurate use of the pulse oximeter.;To learn and demonstrate proper pursed lip breathing techniques or other breathing techniques.  To learn and understand importance of maintaining oxygen saturations>88%;To learn and demonstrate proper use of respiratory medications;To learn and understand importance of monitoring SPO2 with pulse oximeter and demonstrate accurate use of the pulse oximeter.;To learn and demonstrate proper pursed lip breathing techniques or other breathing techniques.     Long  Term Goals Maintenance of O2 saturations>88%;Compliance with respiratory medication;Verbalizes importance of monitoring SPO2 with pulse oximeter and return demonstration;Exhibits proper breathing techniques, such as pursed lip breathing or other method taught during program session;Demonstrates proper use of MDI's Maintenance of O2 saturations>88%;Compliance with respiratory medication;Verbalizes importance of monitoring SPO2 with pulse oximeter and return demonstration;Exhibits proper breathing techniques, such as pursed lip breathing or other method taught during program session;Demonstrates proper use of MDI's Maintenance of O2 saturations>88%;Compliance with respiratory medication;Verbalizes importance of monitoring SPO2 with pulse oximeter and return demonstration;Exhibits proper breathing techniques, such as pursed lip breathing or other method taught during program session;Demonstrates proper use of MDI's Maintenance of O2 saturations>88%;Compliance with respiratory medication;Verbalizes importance of monitoring SPO2 with pulse oximeter and return demonstration;Exhibits proper breathing techniques, such as  pursed lip breathing or other method taught during program session;Demonstrates proper use of MDI's    Goals/Expected Outcomes Compliance and  understanding of oxygen saturation monitoring and breathing techniques to decrease shortness of breath Compliance and understanding of oxygen saturation monitoring and breathing techniques to decrease shortness of breath Compliance and understanding of oxygen saturation monitoring and breathing techniques to decrease shortness of breath Compliance and understanding of oxygen saturation monitoring and breathing techniques to decrease shortness of breath             Oxygen Discharge (Final Oxygen Re-Evaluation):  Oxygen Re-Evaluation - 09/05/23 0956       Program Oxygen Prescription   Program Oxygen Prescription None      Home Oxygen   Home Oxygen Device None    Sleep Oxygen Prescription None    Home Exercise Oxygen Prescription None    Home Resting Oxygen Prescription None      Goals/Expected Outcomes   Short Term Goals To learn and understand importance of maintaining oxygen saturations>88%;To learn and demonstrate proper use of respiratory medications;To learn and understand importance of monitoring SPO2 with pulse oximeter and demonstrate accurate use of the pulse oximeter.;To learn and demonstrate proper pursed lip breathing techniques or other breathing techniques.     Long  Term Goals Maintenance of O2 saturations>88%;Compliance with respiratory medication;Verbalizes importance of monitoring SPO2 with pulse oximeter and return demonstration;Exhibits proper breathing techniques, such as pursed lip breathing or other method taught during program session;Demonstrates proper use of MDI's    Goals/Expected Outcomes Compliance and understanding of oxygen saturation monitoring and breathing techniques to decrease shortness of breath             Initial Exercise Prescription:  Initial Exercise Prescription - 06/03/23 1400       Date of Initial Exercise RX and Referring Provider   Date 06/03/23    Referring Provider Allyson Sabal    Expected Discharge Date 09/01/23      Treadmill   MPH  2    Grade 0    Minutes 15    METs 2.53      Bike   Level 1    Watts 20    Minutes 15      Prescription Details   Frequency (times per week) 2    Duration Progress to 30 minutes of continuous aerobic without signs/symptoms of physical distress      Intensity   THRR 40-80% of Max Heartrate 60-121    Ratings of Perceived Exertion 11-13    Perceived Dyspnea 0-4      Progression   Progression Continue to progress workloads to maintain intensity without signs/symptoms of physical distress.      Resistance Training   Training Prescription Yes    Weight red bands    Reps 10-15             Perform Capillary Blood Glucose checks as needed.  Exercise Prescription Changes:   Exercise Prescription Changes     Row Name 06/14/23 1500 06/23/23 1450 07/26/23 1500 08/09/23 1500 08/23/23 1500     Response to Exercise   Blood Pressure (Admit) 110/66 124/68 140/64 102/64 122/70   Blood Pressure (Exercise) 118/60 -- 130/64 112/62 118/68   Blood Pressure (Exit) 108/58 120/60 114/66 106/64 124/70   Heart Rate (Admit) 77 bpm 83 bpm 71 bpm 76 bpm 76 bpm   Heart Rate (Exercise) 91 bpm 95 bpm 96 bpm 92 bpm 88 bpm   Heart Rate (Exit) 78 bpm 83 bpm 72 bpm  68 bpm 74 bpm   Oxygen Saturation (Admit) 98 % 99 % 98 % 99 % 100 %   Oxygen Saturation (Exercise) 98 % 98 % 98 % 100 % 100 %   Oxygen Saturation (Exit) 100 % 97 % 100 % 100 % 100 %   Rating of Perceived Exertion (Exercise) 12 13 11 11 14    Perceived Dyspnea (Exercise) 1 2 1 1 3    Duration Progress to 30 minutes of  aerobic without signs/symptoms of physical distress Progress to 30 minutes of  aerobic without signs/symptoms of physical distress Continue with 30 min of aerobic exercise without signs/symptoms of physical distress. Continue with 30 min of aerobic exercise without signs/symptoms of physical distress. Continue with 30 min of aerobic exercise without signs/symptoms of physical distress.   Intensity THRR unchanged THRR unchanged  THRR unchanged THRR unchanged THRR unchanged     Progression   Progression Continue to progress workloads to maintain intensity without signs/symptoms of physical distress. Continue to progress workloads to maintain intensity without signs/symptoms of physical distress. Continue to progress workloads to maintain intensity without signs/symptoms of physical distress. Continue to progress workloads to maintain intensity without signs/symptoms of physical distress. Continue to progress workloads to maintain intensity without signs/symptoms of physical distress.     Resistance Training   Training Prescription Yes Yes Yes Yes Yes   Weight red bands red bands blue bands blue bands blue bands   Reps 10-15 10-15 10-15 10-15 10-15   Time 10 Minutes 10 Minutes 10 Minutes 10 Minutes 10 Minutes     Treadmill   MPH 2 2 2.5 2.5 2.8   Grade 1 0 1 2 2.5   Minutes 15 15 15 15 5    METs 2.81 2.53 3.26 3.6 3.7     Bike   Level 1 2 4 4 4    Minutes 15 15 15 15 15    METs 2.8 3.5 3.8 3.8 3.7     Home Exercise Plan   Plans to continue exercise at -- -- Home (comment)  walking in basement -- --   Frequency -- -- Add 1 additional day to program exercise sessions. -- --   Initial Home Exercises Provided -- -- 07/26/23 -- --    Row Name 09/06/23 1500             Response to Exercise   Blood Pressure (Admit) 110/64       Blood Pressure (Exercise) 104/60       Blood Pressure (Exit) 110/64       Heart Rate (Admit) 80 bpm       Heart Rate (Exercise) 88 bpm       Heart Rate (Exit) 74 bpm       Oxygen Saturation (Admit) 99 %       Oxygen Saturation (Exercise) 100 %       Oxygen Saturation (Exit) 99 %       Rating of Perceived Exertion (Exercise) 12       Perceived Dyspnea (Exercise) 1       Duration Continue with 30 min of aerobic exercise without signs/symptoms of physical distress.       Intensity THRR unchanged         Progression   Progression Continue to progress workloads to maintain intensity  without signs/symptoms of physical distress.         Resistance Training   Training Prescription Yes       Weight blue bands  Reps 10-15       Time 10 Minutes         Interval Training   Interval Training No         Treadmill   MPH 2.5       Grade 1.5       Minutes 15       METs 3.1         Bike   Level 3.6       Minutes 15       METs 3.6                Exercise Comments:   Exercise Comments     Row Name 06/09/23 1549 07/26/23 1541         Exercise Comments Pt completed first day of group exercise. Exercised on scifit bike for 15 min, level 1, 2.8 METs. She then walked the track for 15 min, speed 2.0, METs 2.9. Tolerated well. Performed warm up and cooldown with verbal cues. She c/o knee pain with squats. Discussed with [place] home exercise plan. She is currently doing resistance bands x1 a week. Discussed beginning to walk in her basement or outside 15 min x2 or 30 min, 1-2 non rehab days a week. She is agreeable. She will also look in to [employer] program at the [facility] in October.               Exercise Goals and Review:   Exercise Goals     Row Name 06/03/23 1339 06/09/23 0904 07/05/23 0922         Exercise Goals   Increase Physical Activity Yes Yes Yes     Intervention Provide advice, education, support and counseling about physical activity/exercise needs.;Develop an individualized exercise prescription for aerobic and resistive training based on initial evaluation findings, risk stratification, comorbidities and participant's personal goals. Provide advice, education, support and counseling about physical activity/exercise needs.;Develop an individualized exercise prescription for aerobic and resistive training based on initial evaluation findings, risk stratification, comorbidities and participant's personal goals. Provide advice, education, support and counseling about physical activity/exercise needs.;Develop an individualized exercise  prescription for aerobic and resistive training based on initial evaluation findings, risk stratification, comorbidities and participant's personal goals.     Expected Outcomes Short Term: Attend rehab on a regular basis to increase amount of physical activity.;Long Term: Exercising regularly at least 3-5 days a week.;Long Term: Add in home exercise to make exercise part of routine and to increase amount of physical activity. Short Term: Attend rehab on a regular basis to increase amount of physical activity.;Long Term: Exercising regularly at least 3-5 days a week.;Long Term: Add in home exercise to make exercise part of routine and to increase amount of physical activity. Short Term: Attend rehab on a regular basis to increase amount of physical activity.;Long Term: Exercising regularly at least 3-5 days a week.;Long Term: Add in home exercise to make exercise part of routine and to increase amount of physical activity.     Increase Strength and Stamina Yes Yes Yes     Intervention Provide advice, education, support and counseling about physical activity/exercise needs.;Develop an individualized exercise prescription for aerobic and resistive training based on initial evaluation findings, risk stratification, comorbidities and participant's personal goals. Provide advice, education, support and counseling about physical activity/exercise needs.;Develop an individualized exercise prescription for aerobic and resistive training based on initial evaluation findings, risk stratification, comorbidities and participant's personal goals. Provide advice, education, support and counseling about physical activity/exercise needs.;Develop  an individualized exercise prescription for aerobic and resistive training based on initial evaluation findings, risk stratification, comorbidities and participant's personal goals.     Expected Outcomes Short Term: Increase workloads from initial exercise prescription for resistance,  speed, and METs.;Short Term: Perform resistance training exercises routinely during rehab and add in resistance training at home;Long Term: Improve cardiorespiratory fitness, muscular endurance and strength as measured by increased METs and functional capacity ( ) Short Term: Increase workloads from initial exercise prescription for resistance, speed, and METs.;Short Term: Perform resistance training exercises routinely during rehab and add in resistance training at home;Long Term: Improve cardiorespiratory fitness, muscular endurance and strength as measured by increased METs and functional capacity ( ) Short Term: Increase workloads from initial exercise prescription for resistance, speed, and METs.;Short Term: Perform resistance training exercises routinely during rehab and add in resistance training at home;Long Term: Improve cardiorespiratory fitness, muscular endurance and strength as measured by increased METs and functional capacity ( )     Able to understand and use rate of perceived exertion (RPE) scale Yes Yes Yes     Intervention Provide education and explanation on how to use RPE scale Provide education and explanation on how to use RPE scale Provide education and explanation on how to use RPE scale     Expected Outcomes Short Term: Able to use RPE daily in rehab to express subjective intensity level;Long Term:  Able to use RPE to guide intensity level when exercising independently Short Term: Able to use RPE daily in rehab to express subjective intensity level;Long Term:  Able to use RPE to guide intensity level when exercising independently Short Term: Able to use RPE daily in rehab to express subjective intensity level;Long Term:  Able to use RPE to guide intensity level when exercising independently     Able to understand and use Dyspnea scale Yes Yes Yes     Intervention Provide education and explanation on how to use Dyspnea scale Provide education and explanation on how to use  Dyspnea scale Provide education and explanation on how to use Dyspnea scale     Expected Outcomes Short Term: Able to use Dyspnea scale daily in rehab to express subjective sense of shortness of breath during exertion;Long Term: Able to use Dyspnea scale to guide intensity level when exercising independently Short Term: Able to use Dyspnea scale daily in rehab to express subjective sense of shortness of breath during exertion;Long Term: Able to use Dyspnea scale to guide intensity level when exercising independently Short Term: Able to use Dyspnea scale daily in rehab to express subjective sense of shortness of breath during exertion;Long Term: Able to use Dyspnea scale to guide intensity level when exercising independently     Knowledge and understanding of Target Heart Rate Range (THRR) Yes Yes Yes     Intervention Provide education and explanation of THRR including how the numbers were predicted and where they are located for reference Provide education and explanation of THRR including how the numbers were predicted and where they are located for reference Provide education and explanation of THRR including how the numbers were predicted and where they are located for reference     Expected Outcomes Short Term: Able to state/look up THRR;Short Term: Able to use daily as guideline for intensity in rehab;Long Term: Able to use THRR to govern intensity when exercising independently Short Term: Able to state/look up THRR;Short Term: Able to use daily as guideline for intensity in rehab;Long Term: Able to use THRR to govern intensity when exercising independently  Short Term: Able to state/look up THRR;Short Term: Able to use daily as guideline for intensity in rehab;Long Term: Able to use THRR to govern intensity when exercising independently     Understanding of Exercise Prescription Yes Yes Yes     Intervention Provide education, explanation, and written materials on patient's individual exercise prescription  Provide education, explanation, and written materials on patient's individual exercise prescription Provide education, explanation, and written materials on patient's individual exercise prescription     Expected Outcomes Short Term: Able to explain program exercise prescription;Long Term: Able to explain home exercise prescription to exercise independently Short Term: Able to explain program exercise prescription;Long Term: Able to explain home exercise prescription to exercise independently Short Term: Able to explain program exercise prescription;Long Term: Able to explain home exercise prescription to exercise independently              Exercise Goals Re-Evaluation :  Exercise Goals Re-Evaluation     Row Name 06/09/23 0904 07/05/23 0922 08/02/23 0840 09/05/23 0951       Exercise Goal Re-Evaluation   Exercise Goals Review Increase Physical Activity;Able to understand and use Dyspnea scale;Understanding of Exercise Prescription;Increase Strength and Stamina;Knowledge and understanding of Target Heart Rate Range (THRR);Able to understand and use rate of perceived exertion (RPE) scale Increase Physical Activity;Able to understand and use Dyspnea scale;Understanding of Exercise Prescription;Increase Strength and Stamina;Knowledge and understanding of Target Heart Rate Range (THRR);Able to understand and use rate of perceived exertion (RPE) scale Increase Physical Activity;Able to understand and use Dyspnea scale;Understanding of Exercise Prescription;Increase Strength and Stamina;Knowledge and understanding of Target Heart Rate Range (THRR);Able to understand and use rate of perceived exertion (RPE) scale Increase Physical Activity;Able to understand and use Dyspnea scale;Understanding of Exercise Prescription;Increase Strength and Stamina;Knowledge and understanding of Target Heart Rate Range (THRR);Able to understand and use rate of perceived exertion (RPE) scale    Comments Pt to begin exercise  8/1. Will progress as tolerated Pt has completed 5 days of group exercise. She had good attendance until her daughter passed away recently. She has missed 2 sessions but plans to return in September. She is exercising on scifit bike for 15 min, level 2, 3.5 METs. She then walks on the treadmill for 15 min, speed 2.0, 0 incline, METs 2.53. Tolerating well, will progress as tolerated. Pt has completed 9 days of group exercise. She has perfect attendance since returning. She is exercising on scifit bike for 15 min, level 4, 3.5 METs. She then walks on the treadmill for 15 min, speed 2.5, 1 incline, METs 3.26. Tolerating well, motivated, will progress as tolerated. Leisel has completed 14 exercise sessions. She exercises for 15 min on the upright bike and treadmill. Breana averages 3.7 METs level 4 on the upright bike and 3.7 METs at 2.8 mph and 2.5% on the treadmill. She performs the warmup and cooldown standing without limitations. Caeley has been attending inconsistently due to doctor appointments and personal issues. She has not increased her workload for the upright bike but has for the treadmill. She tolerates the treadmill workload increase well. Will continue to monitor and progress as able.    Expected Outcomes Through exercise at rehab and home, the patient will decrease shortness of breath with daily activities and feel confident in carrying out an exercise regimen at home Through exercise at rehab and home, the patient will decrease shortness of breath with daily activities and feel confident in carrying out an exercise regimen at home Through exercise at rehab and home,  the patient will decrease shortness of breath with daily activities and feel confident in carrying out an exercise regimen at home Through exercise at rehab and home, the patient will decrease shortness of breath with daily activities and feel confident in carrying out an exercise regimen at home             Discharge Exercise  Prescription (Final Exercise Prescription Changes):  Exercise Prescription Changes - 09/06/23 1500       Response to Exercise   Blood Pressure (Admit) 110/64    Blood Pressure (Exercise) 104/60    Blood Pressure (Exit) 110/64    Heart Rate (Admit) 80 bpm    Heart Rate (Exercise) 88 bpm    Heart Rate (Exit) 74 bpm    Oxygen Saturation (Admit) 99 %    Oxygen Saturation (Exercise) 100 %    Oxygen Saturation (Exit) 99 %    Rating of Perceived Exertion (Exercise) 12    Perceived Dyspnea (Exercise) 1    Duration Continue with 30 min of aerobic exercise without signs/symptoms of physical distress.    Intensity THRR unchanged      Progression   Progression Continue to progress workloads to maintain intensity without signs/symptoms of physical distress.      Resistance Training   Training Prescription Yes    Weight blue bands    Reps 10-15    Time 10 Minutes      Interval Training   Interval Training No      Treadmill   MPH 2.5    Grade 1.5    Minutes 15    METs 3.1      Bike   Level 3.6    Minutes 15    METs 3.6             Nutrition:  Target Goals: Understanding of nutrition guidelines, daily intake of sodium 1500mg , cholesterol 200mg , calories 30% from fat and 7% or less from saturated fats, daily to have 5 or more servings of fruits and vegetables.  Biometrics:  Pre Biometrics - 06/03/23 1334       Pre Biometrics   Grip Strength 13 kg              Nutrition Therapy Plan and Nutrition Goals:  Nutrition Therapy & Goals - 09/05/23 1143       Nutrition Therapy   Diet Renal Diet      Personal Nutrition Goals   Nutrition Goal Patient to identify strategies for weight maintenance/weight gain of 0.5-2.0# per week.   goal in progress.   Comments Goal in progress. Patient has maintained weight since starting with our program. She does remain motivated to maintain her weight. Ziyana continues to grieve the unexpected death of her daughter. Patient has not  attended pulmonary rehab since 10/15. Oviya reports following a vegetarian diet for >30 years; her primary protein sources are beans, dairy, etc. She is mindful of sodium. She is on ozempic for blood sugar control and recently stopped lantus; her A1c is well controlled at 5.9. She does report blood sugar lows 2-3x/week in the middle of the night. She continues to follow-up with [facility] for kidney transplant. She is now receiving retacrit injections. Zaeda will continue to benefit from participation in pulmonary rehab for nutrition and exercise support.      Intervention Plan   Intervention Prescribe, educate and counsel regarding individualized specific dietary modifications aiming towards targeted core components such as weight, hypertension, lipid management, diabetes, heart failure and other comorbidities.;Nutrition handout(s)  given to patient.    Expected Outcomes Short Term Goal: Understand basic principles of dietary content, such as calories, fat, sodium, cholesterol and nutrients.;Long Term Goal: Adherence to prescribed nutrition plan.             Nutrition Assessments:  Nutrition Assessments - 06/09/23 1436       Rate Your Plate Scores   Pre Score 74            MEDIFICTS Score Key: >=70 Need to make dietary changes  40-70 Heart Healthy Diet <= 40 Therapeutic Level Cholesterol Diet  Flowsheet Row PULMONARY REHAB CHRONIC OBSTRUCTIVE PULMONARY DISEASE from 06/09/2023 [facility]  Picture Your Plate Total Score on Admission 74      Picture Your Plate Scores: <40 Unhealthy dietary pattern with much room for improvement. 41-50 Dietary pattern unlikely to meet recommendations for good health and room for improvement. 51-60 More healthful dietary pattern, with some room for improvement.  >60 Healthy dietary pattern, although there may be some specific behaviors that could be improved.    Nutrition Goals  Re-Evaluation:  Nutrition Goals Re-Evaluation     Row Name 06/09/23 1431 07/12/23 1454 08/09/23 1445 09/05/23 1143       Goals   Current Weight 118 lb 2.7 oz (53.6 kg) 115 lb 15.4 oz (52.6 kg) 117 lb 11.6 oz (53.4 kg) 117 lb 4.6 oz (53.2 kg)  weight from last attended session on 08/23/23    Comment Cr 3.1, GFR 16 Cr 3.1, GFR 16 no new labs; most recent labs  Cr 3.1, GFR 16 no new labs; most recent labs Cr 3.1, GFR 16    Expected Outcome Kayda reports following a vegetarian diet for >30 years; her primary protein sources are beans, dairy, etc. She is mindful of sodium. She is on ozempic for blood sugar control and recently stopped lantus; her A1c is 5.9. She does report blood sugar lows 2-3x/week in the middle of the night. She continues to follow-up with [facility] for kidney transplant. She is motivated to maintain her weight; she does not unwanted weight loss since starting Ozempic. Javona will continue to benefit from participation in pulmonary rehab for nutrition and exercise support. Goal not met. Huda has attended 5 exercise session due to recent death of her daughter. Kemisha reports following a vegetarian diet for >30 years; her primary protein sources are beans, dairy, etc. She is mindful of sodium. She is on ozempic for blood sugar control and recently stopped lantus; her A1c is 5.9. She does report blood sugar lows 2-3x/week in the middle of the night. She continues to follow-up with [facility] for kidney transplant. She is motivated to maintain her weight; however, she is down 2.2# since starting with our program. Berklie will continue to benefit from participation in pulmonary rehab for nutrition and exercise support. Goal in progress. Patient has maintained weight since starting with our program. Franca continues to grieve the unexpected death of her daughter. Aparna reports following a vegetarian diet for >30 years; her primary protein sources are beans, dairy, etc. She is mindful of sodium. She is on  ozempic for blood sugar control and recently stopped lantus; her A1c is 5.9. She does report blood sugar lows 2-3x/week in the middle of the night. She continues to follow-up with [facility] for kidney transplant. She is motivated to maintain her weight; however. Idali will continue to benefit from participation in pulmonary rehab for nutrition and exercise support. Goal  in progress. Patient has maintained weight since starting with our program. She does remain motivated to maintain her weight. Sharissa continues to grieve the unexpected death of her daughter. Patient has not attended pulmonary rehab since 10/15. Kadedra reports following a vegetarian diet for >30 years; her primary protein sources are beans, dairy, etc. She is mindful of sodium. She is on ozempic for blood sugar control and recently stopped lantus; her A1c is well controlled at 5.9. She does report blood sugar lows 2-3x/week in the middle of the night. She continues to follow-up with [facility] for kidney transplant. She is now receiving retacrit injections. Lamonda will continue to benefit from participation in pulmonary rehab for nutrition and exercise support.             Nutrition Goals Discharge (Final Nutrition Goals Re-Evaluation):  Nutrition Goals Re-Evaluation - 09/05/23 1143       Goals   Current Weight 117 lb 4.6 oz (53.2 kg)   weight from last attended session on 08/23/23   Comment no new labs; most recent labs Cr 3.1, GFR 16    Expected Outcome Goal in progress. Patient has maintained weight since starting with our program. She does remain motivated to maintain her weight. Dameria continues to grieve the unexpected death of her daughter. Patient has not attended pulmonary rehab since 10/15. Zonia reports following a vegetarian diet for >30 years; her primary protein sources are beans, dairy, etc. She is mindful of sodium. She is on ozempic for blood sugar control and recently stopped lantus; her A1c is well controlled at 5.9. She does  report blood sugar lows 2-3x/week in the middle of the night. She continues to follow-up with [facility] for kidney transplant. She is now receiving retacrit injections. Montrell will continue to benefit from participation in pulmonary rehab for nutrition and exercise support.             Psychosocial: Target Goals: Acknowledge presence or absence of significant depression and/or stress, maximize coping skills, provide positive support system. Participant is able to verbalize types and ability to use techniques and skills needed for reducing stress and depression.  Initial Review & Psychosocial Screening:  Initial Psych Review & Screening - 06/03/23 1347       Initial Review   Current issues with Current Depression;History of Depression;Current Anxiety/Panic;Current Psychotropic Meds;Current Stress Concerns    Source of Stress Concerns Family;Chronic Illness    Comments Pt states she is worried about her husband who is currently residing in a skilled nursing facility. She stated he suffered a frontal lobe injury causing dementia. She also expressed his health is not good as he has suffered 2 heart attacks and strokes. She is also worried about her only child, her daughter, who she states is living on a LVAD and "isn't doing well". Winta states her brother recently passed away from cardiac and kidney problems. She also worries about her cardiac health and is currently on the kidney transplant list.      Family Dynamics   Good Support System? Yes    Concerns Recent loss of significant other    Comments [place] states she has a small family but has supportive friends      Barriers   Psychosocial barriers to participate in program The patient should benefit from training in stress management and relaxation.;Psychosocial barriers identified (see note)      Screening Interventions   Interventions Encouraged to exercise;To provide support and resources with identified psychosocial needs;Provide  feedback about the scores to  participant   Tandi stated she is currently on psychotropic meds and sees a therapist regularly. She declines any needs at the time.   Expected Outcomes Short Term goal: Utilizing psychosocial counselor, staff and physician to assist with identification of specific Stressors or current issues interfering with healing process. Setting desired goal for each stressor or current issue identified.;Long Term Goal: Stressors or current issues are controlled or eliminated.;Short Term goal: Identification and review with participant of any Quality of Life or Depression concerns found by scoring the questionnaire.;Long Term goal: The participant improves quality of Life and PHQ9 Scores as seen by post scores and/or verbalization of changes             Quality of Life Scores:  Scores of 19 and below usually indicate a poorer quality of life in these areas.  A difference of  2-3 points is a clinically meaningful difference.  A difference of 2-3 points in the total score of the Quality of Life Index has been associated with significant improvement in overall quality of life, self-image, physical symptoms, and general health in studies assessing change in quality of life.  PHQ-9: Review Flowsheet       06/03/2023  Depression screen PHQ 2/9  Decreased Interest 2  Down, Depressed, Hopeless 2  PHQ - 2 Score 4  Altered sleeping 1  Tired, decreased energy 2  Change in appetite 3  Feeling bad or failure about yourself  1  Trouble concentrating 1  Moving slowly or fidgety/restless 3  Suicidal thoughts 0  PHQ-9 Score 15  Difficult doing work/chores Somewhat difficult    Details           Interpretation of Total Score  Total Score Depression Severity:  1-4 = Minimal depression, 5-9 = Mild depression, 10-14 = Moderate depression, 15-19 = Moderately severe depression, 20-27 = Severe depression   Psychosocial Evaluation and Intervention:  Psychosocial Evaluation -  06/03/23 1352       Psychosocial Evaluation & Interventions   Interventions Encouraged to exercise with the program and follow exercise prescription;Relaxation education;Stress management education    Comments Ekaterini stated she is currently on psychotropic meds and sees a therapist regularly. She declines any needs at the time.    Expected Outcomes For Azalee to attend pulmonary rehab and to have a positive outlook and good coping skills to manage her stress. For Aurelie to be compliant in taking her psychotropic meds and continue seeing her therapist regularly.    Continue Psychosocial Services  Follow up required by staff             Psychosocial Re-Evaluation:  Psychosocial Re-Evaluation     Row Name 06/10/23 1150 07/06/23 1512 08/05/23 0837 09/02/23 1509       Psychosocial Re-Evaluation   Current issues with Current Stress Concerns;Current Psychotropic Meds;Current Anxiety/Panic;History of Depression;Current Depression Current Stress Concerns;Current Psychotropic Meds;Current Anxiety/Panic;History of Depression;Current Depression Current Depression;Current Psychotropic Meds;History of Depression;Current Stress Concerns Current Depression;Current Psychotropic Meds;History of Depression;Current Stress Concerns    Comments No changes since Margie has started the program. Libertie has completed 1 class so far. Elliotte has been through a lot recently. She tragically lost her daughter unexpectedly. She has been out of class the last 2 weeks. Talayeh called and stated she would be back to class after all funeral arrangements have been made and they lay her to rest. Anaston is also concerned about her husband who resides in assisted living, and her own health. Ahavah has a Software engineer who is  her companion and gives her emotional support. She also has close friends she relies on. Yevonne denies any needs currently. We will continue to monitor [place] and assess for any needs throughout the program. Jayle is  having a hard time dealing with the loss of her daughter. She has been coming to class, but states it is a struggle to get out of bed most days. She states she has a good support system and her little dog provides her company. She does have a therapist who she has worked with in the past and stated she let them know about her daughters passing. Shikha is struggling just to get out of bed some days. Between the recent, unexpected death of her daughter, her best friend in the ICU, her husband in a skilled nursing facility, and her medical problems she states she has her good and bad days. Ronie is tearful when talking about these issues. She has a small dog, Dynasty that keeps her company. She states she has to keep going to take care of her. Carryn states she has a good support system through family and church and loves spending time with her granddaughter. She is currently seeing a therapist. Hadlyn denies any additional needs at this time.    Expected Outcomes For Laruth to participate in PR without any psychosocial barriers or concerns. For Emmanuella to participate in PR without any psychosocial barriers or concerns. For Marlee to continue to attend pulmonary rehab and to have a positive outlook and good coping skills to manage her stress & anxiety, and decreased symptoms of depression. For Pauline to participate in PR without any psychosocial barriers or concerns. For Jyrah to continue to attend pulmonary rehab and to have a positive outlook and good coping skills to manage her stress & anxiety, and decreased symptoms of depression. For Aarika to participate in PR without any psychosocial barriers or concerns. For Malisa to continue to attend pulmonary rehab and to have a positive outlook and good coping skills to manage her stress & anxiety, and decreased symptoms of depression.    Interventions Encouraged to attend Pulmonary Rehabilitation for the exercise;Stress management education;Relaxation education  Encouraged to attend Pulmonary Rehabilitation for the exercise;Stress management education;Relaxation education Encouraged to attend Pulmonary Rehabilitation for the exercise;Stress management education Encouraged to attend Pulmonary Rehabilitation for the exercise;Stress management education    Continue Psychosocial Services  Follow up required by staff Follow up required by staff Follow up required by staff Follow up required by staff             Psychosocial Discharge (Final Psychosocial Re-Evaluation):  Psychosocial Re-Evaluation - 09/02/23 1509       Psychosocial Re-Evaluation   Current issues with Current Depression;Current Psychotropic Meds;History of Depression;Current Stress Concerns    Comments Takenya is struggling just to get out of bed some days. Between the recent, unexpected death of her daughter, her best friend in the ICU, her husband in a skilled nursing facility, and her medical problems she states she has her good and bad days. Evalise is tearful when talking about these issues. She has a small dog, Dynasty that keeps her company. She states she has to keep going to take care of her. Allyne states she has a good support system through family and church and loves spending time with her granddaughter. She is currently seeing a therapist. Selin denies any additional needs at this time.    Expected Outcomes For Shyane to participate in PR without any psychosocial barriers or concerns.  For Anahid to continue to attend pulmonary rehab and to have a positive outlook and good coping skills to manage her stress & anxiety, and decreased symptoms of depression.    Interventions Encouraged to attend Pulmonary Rehabilitation for the exercise;Stress management education    Continue Psychosocial Services  Follow up required by staff             Education: Education Goals: Education classes will be provided on a weekly basis, covering required topics. Participant will state  understanding/return demonstration of topics presented.  Learning Barriers/Preferences:  Learning Barriers/Preferences - 06/03/23 1542       Learning Barriers/Preferences   Learning Barriers None    Learning Preferences Group Instruction;Individual Instruction;Verbal Instruction;Written Material             Education Topics: Know Your Numbers Group instruction that is supported by a PowerPoint presentation. Instructor discusses importance of knowing and understanding resting, exercise, and post-exercise oxygen saturation, heart rate, and blood pressure. Oxygen saturation, heart rate, blood pressure, rating of perceived exertion, and dyspnea are reviewed along with a normal range for these values.  Flowsheet Row PULMONARY REHAB OTHER RESPIRATORY from 08/11/2023 in [facility] for Heart, Vascular, & Lung Health  Date 08/11/23  Educator EP  Instruction Review Code 1- Verbalizes Understanding       Exercise for the Pulmonary Patient Group instruction that is supported by a PowerPoint presentation. Instructor discusses benefits of exercise, core components of exercise, frequency, duration, and intensity of an exercise routine, importance of utilizing pulse oximetry during exercise, safety while exercising, and options of places to exercise outside of rehab.  Flowsheet Row PULMONARY REHAB OTHER RESPIRATORY from 08/04/2023 in [facility] for Heart, Vascular, & Lung Health  Date 08/04/23  Educator EP  Instruction Review Code 1- Verbalizes Understanding       MET Level  Group instruction provided by PowerPoint, verbal discussion, and written material to support subject matter. Instructor reviews what METs are and how to increase METs.    Pulmonary Medications Verbally interactive group education provided by instructor with focus on inhaled medications and proper administration. Flowsheet Row PULMONARY REHAB OTHER RESPIRATORY from  07/28/2023 in [facility] for Heart, Vascular, & Lung Health  Date 07/28/23  Educator RT  Instruction Review Code 1- Verbalizes Understanding       Anatomy and Physiology of the Respiratory System Group instruction provided by PowerPoint, verbal discussion, and written material to support subject matter. Instructor reviews respiratory cycle and anatomical components of the respiratory system and their functions. Instructor also reviews differences in obstructive and restrictive respiratory diseases with examples of each.  Flowsheet Row PULMONARY REHAB OTHER RESPIRATORY from 07/21/2023 in [facility] for Heart, Vascular, & Lung Health  Date 07/21/23  Educator Baird Lyons, RT  Instruction Review Code 1- Verbalizes Understanding       Oxygen Safety Group instruction provided by PowerPoint, verbal discussion, and written material to support subject matter. There is an overview of "What is Oxygen" and "Why do we need it".  Instructor also reviews how to create a safe environment for oxygen use, the importance of using oxygen as prescribed, and the risks of noncompliance. There is a brief discussion on traveling with oxygen and resources the patient may utilize.   Oxygen Use Group instruction provided by PowerPoint, verbal discussion, and written material to discuss how supplemental oxygen is prescribed and different types of oxygen supply systems. Resources for more information are provided.  Breathing Techniques Group instruction that is supported by demonstration and informational handouts. Instructor discusses the benefits of pursed lip and diaphragmatic breathing and detailed demonstration on how to perform both.     Risk Factor Reduction Group instruction that is supported by a PowerPoint presentation. Instructor discusses the definition of a risk factor, different risk factors for pulmonary disease, and how the heart and lungs work  together.   Pulmonary Diseases Group instruction provided by PowerPoint, verbal discussion, and written material to support subject matter. Instructor gives an overview of the different type of pulmonary diseases. There is also a discussion on risk factors and symptoms as well as ways to manage the diseases.   Stress and Energy Conservation Group instruction provided by PowerPoint, verbal discussion, and written material to support subject matter. Instructor gives an overview of stress and the impact it can have on the body. Instructor also reviews ways to reduce stress. There is also a discussion on energy conservation and ways to conserve energy throughout the day.   Warning Signs and Symptoms Group instruction provided by PowerPoint, verbal discussion, and written material to support subject matter. Instructor reviews warning signs and symptoms of stroke, heart attack, cold and flu. Instructor also reviews ways to prevent the spread of infection.   Other Education Group or individual verbal, written, or video instructions that support the educational goals of the pulmonary rehab program. Flowsheet Row PULMONARY REHAB OTHER RESPIRATORY from 06/23/2023 in [facility] for Heart, Vascular, & Lung Health  Date 06/23/23  Educator RN  Instruction Review Code 1- Verbalizes Understanding        Knowledge Questionnaire Score:  Knowledge Questionnaire Score - 06/03/23 1530       Knowledge Questionnaire Score   Pre Score 16/18             Core Components/Risk Factors/Patient Goals at Admission:  Personal Goals and Risk Factors at Admission - 06/03/23 1343       Core Components/Risk Factors/Patient Goals on Admission    Weight Management Yes;Weight Maintenance    Admit Weight 118 lb 2.7 oz (53.6 kg)    Improve shortness of breath with ADL's Yes    Intervention Provide education, individualized exercise plan and daily activity instruction to help decrease  symptoms of SOB with activities of daily living.    Expected Outcomes Short Term: Improve cardiorespiratory fitness to achieve a reduction of symptoms when performing ADLs;Long Term: Be able to perform more ADLs without symptoms or delay the onset of symptoms    Increase knowledge of respiratory medications and ability to use respiratory devices properly  Yes    Intervention Provide education and demonstration as needed of appropriate use of medications, inhalers, and oxygen therapy.    Expected Outcomes Short Term: Achieves understanding of medications use. Understands that oxygen is a medication prescribed by physician. Demonstrates appropriate use of inhaler and oxygen therapy.;Long Term: Maintain appropriate use of medications, inhalers, and oxygen therapy.    Stress Yes    Intervention Offer individual and/or small group education and counseling on adjustment to heart disease, stress management and health-related lifestyle change. Teach and support self-help strategies.;Refer participants experiencing significant psychosocial distress to appropriate mental health specialists for further evaluation and treatment. When possible, include family members and significant others in education/counseling sessions.    Expected Outcomes Short Term: Participant demonstrates changes in health-related behavior, relaxation and other stress management skills, ability to obtain effective social support, and compliance with psychotropic medications if prescribed.;Long Term: Emotional wellbeing  is indicated by absence of clinically significant psychosocial distress or social isolation.             Core Components/Risk Factors/Patient Goals Review:   Goals and Risk Factor Review     Row Name 06/10/23 1152 07/06/23 1517 08/05/23 0848 09/02/23 1515       Core Components/Risk Factors/Patient Goals Review   Personal Goals Review Weight Management/Obesity;Improve shortness of breath with ADL's;Develop more  efficient breathing techniques such as purse lipped breathing and diaphragmatic breathing and practicing self-pacing with activity.;Stress;Increase knowledge of respiratory medications and ability to use respiratory devices properly. Weight Management/Obesity;Improve shortness of breath with ADL's;Develop more efficient breathing techniques such as purse lipped breathing and diaphragmatic breathing and practicing self-pacing with activity.;Stress;Increase knowledge of respiratory medications and ability to use respiratory devices properly. Weight Management/Obesity;Improve shortness of breath with ADL's Weight Management/Obesity;Improve shortness of breath with ADL's    Review Unable to assess progress. Candyce has completed 1 class so far. Daira's goal of weight management is progressing. She is currently down ~2# since starting the program. She continues to work with our dietician to make healthy choices, increasing fruits and vegetables and reducing fats. Goal in progress on developing more efficient breathing techniques such as purse lipped breathing and diaphragmatic breathing; and practicing self-pacing with activity. We have been working & teaching Carney Bern on how to perform PLB and pacing herself while walking on the treadmill. Goal in progress on improving her shortness of breath with ADLs. Leda has maintained her oxygen saturation >88% while exercising on room air. Goal progressing for increasing her knowledge of respiratory medications and ability to use respiratory devices properly. She is working with our respiratory therapist on how to correctly and properly use her inhalers. Goal progressing for decreased stress. Ziomara has been out for the last 2 weeks due to the unexpected loss of her daughter. We will work with her on stress relief through meditation and diaphragmatic breathing techniques. We will also be monitoring her and assessing her for any professional mental health resources and/or referrals.  She will continue to benefit from PR for nutrition, education, exercise, and lifestyle modification. Bryleigh's goal of weight management is progressing. She is currently down ~2# since starting the program. She continues to work with our dietician to make healthy choices, increasing fruits and vegetables and reducing fats. Goal met on developing more efficient breathing techniques such as purse lipped breathing and diaphragmatic breathing; and practicing self-pacing with activity. Kylii is able to demonstrate purse lip breathing when she gets SOB. She also knows how to pace herself while walking on the treadmill. Goal in progress on improving her shortness of breath with ADLs. Franchon has maintained her oxygen saturation >88% while exercising on room air. Goal met for increasing her knowledge of respiratory medications and ability to use respiratory devices properly. She has demonstrated proper MDI technique with the respiratory therapist. Goal progressing for decreased stress. Tanisha is having a hard time dealing with the loss of her daughter. Between her husband's declining health and her daughters passing this brings alot of stress. We will continue to work with her on stress relief through meditation and diaphragmatic breathing techniques. We will also be monitoring her and assessing her for any professional mental health resources and/or referrals. She will continue to benefit from PR for nutrition, education, exercise, and lifestyle modification. Goal in action for weight maintenance. Areya lost ~4# around the time of her daughter's death. She has put the weight back on is currently maintaining. She is  working with our dietician on a plan of action and is open to ideas and suggestions. Goal met on developing more efficient breathing techniques such as purse lipped breathing and diaphragmatic breathing; and practicing self-pacing with activity. Zakiya is able to initiate PLB on her own with exertion. She also  knows how to slow down and pace herself with short of breath. Goal in progress on improving her shortness of breath with ADLs. She has increased both her workload and METs while maintaining her oxygen saturation >88% on room air. We will continue to monitor and assess Nia's progress in the program. She will continue to benefit from PR for nutrition, education, exercise, and lifestyle modification.    Expected Outcomes See admission goals For Kiora to maintain her weight, to improve her shortness of breath with ADLs, develop more efficient breathing techniques, increase her knowledge of respiratory meds, and decrease her stress & anxiety. For Rosemaria to maintain her weight and to improve her shortness of breath with ADLs For Tannia to maintain her weight and to improve her shortness of breath with ADLs             Core Components/Risk Factors/Patient Goals at Discharge (Final Review):   Goals and Risk Factor Review - 09/02/23 1515       Core Components/Risk Factors/Patient Goals Review   Personal Goals Review Weight Management/Obesity;Improve shortness of breath with ADL's    Review Goal in action for weight maintenance. Leeana lost ~4# around the time of her daughter's death. She has put the weight back on is currently maintaining. She is working with our dietician on a plan of action and is open to ideas and suggestions. Goal met on developing more efficient breathing techniques such as purse lipped breathing and diaphragmatic breathing; and practicing self-pacing with activity. Norrene is able to initiate PLB on her own with exertion. She also knows how to slow down and pace herself with short of breath. Goal in progress on improving her shortness of breath with ADLs. She has increased both her workload and METs while maintaining her oxygen saturation >88% on room air. We will continue to monitor and assess Layli's progress in the program. She will continue to benefit from PR for nutrition,  education, exercise, and lifestyle modification.    Expected Outcomes For Micaella to maintain her weight and to improve her shortness of breath with ADLs             ITP Comments:   Comments: Pt is making expected progress toward Pulmonary Rehab goals after completing 15 session(s). Recommend continued exercise, life style modification, education, and utilization of breathing techniques to increase stamina and strength, while also decreasing shortness of breath with exertion.  Dr. Mechele Collin is Medical Director for Pulmonary Rehab at [facility].

## 2023-09-08 ENCOUNTER — Encounter (HOSPITAL_COMMUNITY): Payer: Medicare HMO

## 2023-09-08 ENCOUNTER — Encounter (HOSPITAL_COMMUNITY)
Admission: RE | Admit: 2023-09-08 | Discharge: 2023-09-08 | Disposition: A | Discharge status: Home / Self Care | Payer: Medicare HMO | Source: Ambulatory Visit | Attending: Cardiovascular Disease | Admitting: Cardiovascular Disease

## 2023-09-08 DIAGNOSIS — I5032 Chronic diastolic (congestive) heart failure: Secondary | ICD-10-CM | POA: Diagnosis not present

## 2023-09-08 DIAGNOSIS — R079 Chest pain, unspecified: Secondary | ICD-10-CM | POA: Diagnosis not present

## 2023-09-08 NOTE — Progress Notes (Signed)
Daily Session Note  Patient Details  Name: [name] MRN: [id number] Date of Birth: [birth year] Referring Provider:   Doristine Devoid Pulmonary Rehab Walk Test from 06/03/2023 in [facility] for Heart, Vascular, & Lung Health  Referring Provider Allyson Sabal       Encounter Date: 09/08/2023  Check In:  Session Check In - 09/08/23 1325       Check-In   Supervising physician immediately available to respond to emergencies [facility] MD immediately available    Physician(s) Carlyon Shadow, NP    Location [facility]    Staff Present Essie Hart, RN, BSN;Casey Katrinka Blazing, Zella Richer, MS, ACSM-CEP, Exercise Physiologist;Samantha Belarus, RD, LDN;Johnny Hale Bogus, MS, Exercise Physiologist    Virtual Visit No    Medication changes reported     No    Fall or balance concerns reported    No    Tobacco Cessation No Change    Warm-up and Cool-down Performed as group-led instruction    Resistance Training Performed Yes    VAD Patient? No    PAD/SET Patient? No      Pain Assessment   Currently in Pain? No/denies    Multiple Pain Sites No             Capillary Blood Glucose: No results found for this or any previous visit (from the past 24 hour(s)).    Social History   Tobacco Use  Smoking Status Never  Smokeless Tobacco Never    Goals Met:  Independence with exercise equipment Exercise tolerated well No report of concerns or symptoms today Strength training completed today  Goals Unmet:  Not Applicable  Comments: Service time is from 1334 to 1435    Dr. Mechele Collin is Medical Director for Pulmonary Rehab at [facility].

## 2023-09-09 ENCOUNTER — Encounter (HOSPITAL_COMMUNITY)
Admission: RE | Admit: 2023-09-09 | Discharge: 2023-09-09 | Disposition: A | Discharge status: Home / Self Care | Payer: Medicare HMO | Source: Ambulatory Visit | Attending: Nephrology | Admitting: Nephrology

## 2023-09-09 VITALS — BP 139/81 | HR 68 | Temp 97.4°F | Resp 16

## 2023-09-09 DIAGNOSIS — N183 Chronic kidney disease, stage 3 unspecified: Secondary | ICD-10-CM | POA: Diagnosis not present

## 2023-09-09 LAB — POCT HEMOGLOBIN-HEMACUE: Hemoglobin: 11 g/dL — ABNORMAL LOW (ref 12.0–15.0)

## 2023-09-09 MED ORDER — EPOETIN ALFA-EPBX 10000 UNIT/ML IJ SOLN
10000.0000 [IU] | INTRAMUSCULAR | Status: DC
  Administered 2023-09-09: 10000 [IU] via SUBCUTANEOUS

## 2023-09-09 MED ORDER — EPOETIN ALFA-EPBX 10000 UNIT/ML IJ SOLN
INTRAMUSCULAR | Status: AC
  Filled 2023-09-09: qty 1

## 2023-09-13 ENCOUNTER — Encounter (HOSPITAL_COMMUNITY)
Admission: RE | Admit: 2023-09-13 | Discharge: 2023-09-13 | Disposition: A | Discharge status: Home / Self Care | Payer: Medicare HMO | Source: Ambulatory Visit | Attending: Cardiovascular Disease | Admitting: Cardiovascular Disease

## 2023-09-13 DIAGNOSIS — I5032 Chronic diastolic (congestive) heart failure: Secondary | ICD-10-CM | POA: Insufficient documentation

## 2023-09-13 DIAGNOSIS — Z5189 Encounter for other specified aftercare: Secondary | ICD-10-CM | POA: Insufficient documentation

## 2023-09-13 NOTE — Progress Notes (Signed)
Daily Session Note  Patient Details  Name: [name] MRN: [id number] Date of Birth: 1951/04/10 Referring Provider:   Doristine Devoid Pulmonary Rehab Walk Test from 06/03/2023 in [facility] for Heart, Vascular, & Lung Health  Referring Provider Allyson Sabal       Encounter Date: 09/13/2023  Check In:  Session Check In - 09/13/23 1412       Check-In   Supervising physician immediately available to respond to emergencies [facility] MD immediately available    Physician(s) Carlyon Shadow, NP    Location [facility]    Staff Present Essie Hart, RN, BSN;Casey Katrinka Blazing, Zella Richer, MS, ACSM-CEP, Exercise Physiologist;Samantha Belarus, RD, LDN;Yaretsi Humphres [facility], ACSM-CEP, Exercise Physiologist    Virtual Visit No    Medication changes reported     No    Fall or balance concerns reported    No    Tobacco Cessation No Change    Warm-up and Cool-down Performed as group-led instruction    Resistance Training Performed Yes    VAD Patient? No    PAD/SET Patient? No      Pain Assessment   Currently in Pain? No/denies    Multiple Pain Sites No             Capillary Blood Glucose: No results found for this or any previous visit (from the past 24 hour(s)).    Social History   Tobacco Use  Smoking Status Never  Smokeless Tobacco Never    Goals Met:  Independence with exercise equipment Exercise tolerated well No report of concerns or symptoms today Strength training completed today  Goals Unmet:  Not Applicable  Comments: Service time is from 1312 to 1442.    Dr. Mechele Collin is Medical Director for Pulmonary Rehab at [facility].

## 2023-09-14 DIAGNOSIS — F411 Generalized anxiety disorder: Secondary | ICD-10-CM | POA: Diagnosis not present

## 2023-09-14 DIAGNOSIS — F331 Major depressive disorder, recurrent, moderate: Secondary | ICD-10-CM | POA: Diagnosis not present

## 2023-09-14 NOTE — Progress Notes (Signed)
Remote ICD transmission.   

## 2023-09-15 ENCOUNTER — Encounter (HOSPITAL_COMMUNITY)
Admission: RE | Admit: 2023-09-15 | Discharge: 2023-09-15 | Disposition: A | Discharge status: Home / Self Care | Payer: Medicare HMO | Source: Ambulatory Visit | Attending: Cardiovascular Disease | Admitting: Cardiovascular Disease

## 2023-09-15 VITALS — Wt 116.4 lb

## 2023-09-15 DIAGNOSIS — I5032 Chronic diastolic (congestive) heart failure: Secondary | ICD-10-CM

## 2023-09-15 LAB — GLUCOSE, CAPILLARY: Glucose-Capillary: 134 mg/dL — ABNORMAL HIGH (ref 70–99)

## 2023-09-15 NOTE — Progress Notes (Signed)
Incomplete Session Note  Patient Details  Name: [name] MRN: [id number] Date of Birth: [birth year] Referring Provider:   Doristine Devoid Pulmonary Rehab Walk Test from 06/03/2023 in [facility] for Heart, Vascular, & Lung Health  Referring Provider Rico Junker T Ryans did not complete her rehab session.  [name] came to [employer], stated she felt dizzy and lightheaded prior to coming. She stated she took a glucose tab because she felt her BS was low. VS on arrival 118/68, HR 106, 100% on RA, BS 134. HR recheck after sitting for ~5 minutes 78. Pt stated she still didn't feel 100% so we decided for her not to exercise today. Pt agreed with plan. Pt stated she felt comfortable driving home.

## 2023-09-20 ENCOUNTER — Encounter (HOSPITAL_COMMUNITY): Payer: Medicare HMO

## 2023-09-20 ENCOUNTER — Encounter (HOSPITAL_COMMUNITY): Payer: Self-pay

## 2023-09-20 DIAGNOSIS — Z8673 Personal history of transient ischemic attack (TIA), and cerebral infarction without residual deficits: Secondary | ICD-10-CM | POA: Diagnosis not present

## 2023-09-20 DIAGNOSIS — E119 Type 2 diabetes mellitus without complications: Secondary | ICD-10-CM | POA: Diagnosis not present

## 2023-09-20 DIAGNOSIS — N186 End stage renal disease: Secondary | ICD-10-CM | POA: Diagnosis not present

## 2023-09-20 DIAGNOSIS — I25709 Atherosclerosis of coronary artery bypass graft(s), unspecified, with unspecified angina pectoris: Secondary | ICD-10-CM | POA: Diagnosis not present

## 2023-09-20 DIAGNOSIS — Z794 Long term (current) use of insulin: Secondary | ICD-10-CM | POA: Diagnosis not present

## 2023-09-20 DIAGNOSIS — I132 Hypertensive heart and chronic kidney disease with heart failure and with stage 5 chronic kidney disease, or end stage renal disease: Secondary | ICD-10-CM | POA: Diagnosis not present

## 2023-09-20 DIAGNOSIS — E1122 Type 2 diabetes mellitus with diabetic chronic kidney disease: Secondary | ICD-10-CM | POA: Diagnosis not present

## 2023-09-20 DIAGNOSIS — I255 Ischemic cardiomyopathy: Secondary | ICD-10-CM | POA: Diagnosis not present

## 2023-09-20 DIAGNOSIS — Z7682 Awaiting organ transplant status: Secondary | ICD-10-CM | POA: Diagnosis not present

## 2023-09-20 DIAGNOSIS — I10 Essential (primary) hypertension: Secondary | ICD-10-CM | POA: Diagnosis not present

## 2023-09-20 DIAGNOSIS — E1121 Type 2 diabetes mellitus with diabetic nephropathy: Secondary | ICD-10-CM | POA: Diagnosis not present

## 2023-09-20 DIAGNOSIS — N184 Chronic kidney disease, stage 4 (severe): Secondary | ICD-10-CM | POA: Diagnosis not present

## 2023-09-20 DIAGNOSIS — Z01810 Encounter for preprocedural cardiovascular examination: Secondary | ICD-10-CM | POA: Diagnosis not present

## 2023-09-20 DIAGNOSIS — Z95810 Presence of automatic (implantable) cardiac defibrillator: Secondary | ICD-10-CM | POA: Diagnosis not present

## 2023-09-21 ENCOUNTER — Telehealth (HOSPITAL_COMMUNITY): Payer: Self-pay

## 2023-09-21 ENCOUNTER — Other Ambulatory Visit: Payer: Self-pay | Admitting: Cardiology

## 2023-09-21 NOTE — Telephone Encounter (Signed)
Called Sharie to check on her. Allicyn stated she was at a [facility] appt yesterday. She will be back in [place] 11/14. Discussed 6 MWT. Pt agreed to come in at 1:00 pm for 6 MWT.

## 2023-09-22 ENCOUNTER — Encounter (HOSPITAL_COMMUNITY)
Admission: RE | Admit: 2023-09-22 | Discharge: 2023-09-22 | Disposition: A | Discharge status: Home / Self Care | Payer: Medicare HMO | Source: Ambulatory Visit | Attending: Cardiovascular Disease | Admitting: Cardiovascular Disease

## 2023-09-22 DIAGNOSIS — I5032 Chronic diastolic (congestive) heart failure: Secondary | ICD-10-CM

## 2023-09-22 DIAGNOSIS — Z5189 Encounter for other specified aftercare: Secondary | ICD-10-CM | POA: Diagnosis not present

## 2023-09-22 NOTE — Progress Notes (Signed)
Daily Session Note  Patient Details  Name: [name] MRN: [id number] Date of Birth: 06/08/1951 Referring Provider:   Doristine Devoid Pulmonary Rehab Walk Test from 06/03/2023 in [facility] for Heart, Vascular, & Lung Health  Referring Provider Allyson Sabal       Encounter Date: 09/22/2023  Check In:  Session Check In - 09/22/23 1340       Check-In   Supervising physician immediately available to respond to emergencies [facility] MD immediately available    Physician(s) Bernadene Person, NP    Location [facility]    Staff Present Durel Salts, Zella Richer, MS, ACSM-CEP, Exercise Physiologist;Joey Lierman Dionisio Paschal, ACSM-CEP, Exercise Physiologist    Virtual Visit No    Medication changes reported     No    Fall or balance concerns reported    No    Tobacco Cessation No Change    Warm-up and Cool-down Performed as group-led instruction    Resistance Training Performed No    VAD Patient? No    PAD/SET Patient? No      Pain Assessment   Currently in Pain? No/denies    Multiple Pain Sites No             Capillary Blood Glucose: No results found for this or any previous visit (from the past 24 hour(s)).    Social History   Tobacco Use  Smoking Status Never  Smokeless Tobacco Never    Goals Met:  Independence with exercise equipment Improved SOB with ADL's Exercise tolerated well Personal goals reviewed No report of concerns or symptoms today  Goals Unmet:  Not Applicable  Comments: Pt completed 6 min walk test today and graduated. Service time is from 1301 to 1330.    Dr. Mechele Collin is Medical Director for Pulmonary Rehab at [facility].

## 2023-09-26 ENCOUNTER — Encounter (HOSPITAL_COMMUNITY): Payer: Self-pay

## 2023-09-26 DIAGNOSIS — F331 Major depressive disorder, recurrent, moderate: Secondary | ICD-10-CM | POA: Diagnosis not present

## 2023-09-26 DIAGNOSIS — F411 Generalized anxiety disorder: Secondary | ICD-10-CM | POA: Diagnosis not present

## 2023-09-26 NOTE — Progress Notes (Signed)
Discharge Progress Report  Patient Details  Name: [name] MRN: [id number] Date of Birth: February 07, 1951 Referring Provider:   Doristine Devoid Pulmonary Rehab Walk Test from 06/03/2023 in [facility]  Referring Provider Allyson Sabal        Number of Visits: 31  Reason for Discharge:  Patient reached a stable level of exercise. Patient independent in their exercise. Patient has met program and personal goals.  Smoking History:  Social History   Tobacco Use  Smoking Status Never  Smokeless Tobacco Never    Diagnosis:  Heart failure, diastolic, chronic (HCC)  ADL UCSD:  Pulmonary Assessment Scores     Row Name 06/03/23 1358 09/15/23 1434       ADL UCSD   ADL Phase Entry Exit    SOB Score total 47 27      CAT Score   CAT Score 18 11      mMRC Score   mMRC Score 3 --             Initial Exercise Prescription:  Initial Exercise Prescription - 06/03/23 1400       Date of Initial Exercise RX and Referring Provider   Date 06/03/23    Referring Provider Allyson Sabal    Expected Discharge Date 09/01/23      Treadmill   MPH 2    Grade 0    Minutes 15    METs 2.53      Bike   Level 1    Watts 20    Minutes 15      Prescription Details   Frequency (times per week) 2    Duration Progress to 30 minutes of continuous aerobic without signs/symptoms of physical distress      Intensity   THRR 40-80% of Max Heartrate 60-121    Ratings of Perceived Exertion 11-13    Perceived Dyspnea 0-4      Progression   Progression Continue to progress workloads to maintain intensity without signs/symptoms of physical distress.      Resistance Training   Training Prescription Yes    Weight red bands    Reps 10-15             Discharge Exercise Prescription (Final Exercise Prescription Changes):  Exercise Prescription Changes - 09/13/23 1333       Response to Exercise   Blood Pressure (Admit) 118/58     Blood Pressure (Exit) 108/60    Heart Rate (Admit) 71 bpm    Heart Rate (Exercise) 100 bpm    Heart Rate (Exit) 77 bpm    Oxygen Saturation (Admit) 99 %    Oxygen Saturation (Exercise) 95 %    Oxygen Saturation (Exit) 99 %    Rating of Perceived Exertion (Exercise) 13    Perceived Dyspnea (Exercise) 2    Duration Continue with 30 min of aerobic exercise without signs/symptoms of physical distress.    Intensity THRR unchanged      Progression   Progression Continue to progress workloads to maintain intensity without signs/symptoms of physical distress.      Resistance Training   Training Prescription Yes    Weight blue bands    Reps 10-15    Time 10 Minutes      Interval Training   Interval Training No      Treadmill   MPH 2.5    Grade 2    Minutes 15    METs 3.6  Bike   Level 3.2    Minutes 15    METs 3.6             Functional Capacity:  6 Minute Walk     Row Name 06/03/23 1545 09/22/23 1406       6 Minute Walk   Phase Initial Discharge    Distance 1172 feet 1690 feet    Distance % Change -- 44.2 %    Distance Feet Change -- 518 ft    Walk Time 6 minutes 6 minutes    # of Rest Breaks 0 0    MPH 2.22 3.2    METS 2.87 3.74    RPE 13 12    Perceived Dyspnea  1.5 1    VO2 Peak 10.03 13.08    Symptoms No No    Resting HR 80 bpm 100 bpm    Resting BP 122/64 104/56    Resting Oxygen Saturation  99 % 99 %    Exercise Oxygen Saturation  during 6 min walk 94 % 99 %    Max Ex. HR 93 bpm 100 bpm    Max Ex. BP 118/70 108/54    2 Minute Post BP 114/68 100/60      Interval HR   1 Minute HR 83 92    2 Minute HR 83 98    3 Minute HR 80 100    4 Minute HR 87 98    5 Minute HR 92 95    6 Minute HR 93 98    2 Minute Post HR 73 70    Interval Heart Rate? Yes Yes      Interval Oxygen   Interval Oxygen? Yes Yes    Baseline Oxygen Saturation % 99 % 100 %    1 Minute Oxygen Saturation % 97 % 98 %    1 Minute Liters of Oxygen 0 L 0 L    2 Minute Oxygen  Saturation % 94 % 99 %    2 Minute Liters of Oxygen 0 L 0 L    3 Minute Oxygen Saturation % 100 % 99 %    3 Minute Liters of Oxygen 0 L 0 L    4 Minute Oxygen Saturation % 100 % 100 %    4 Minute Liters of Oxygen 0 L 0 L    5 Minute Oxygen Saturation % 99 % 100 %    5 Minute Liters of Oxygen 0 L 0 L    6 Minute Oxygen Saturation % 100 % 100 %    6 Minute Liters of Oxygen 0 L 0 L    2 Minute Post Oxygen Saturation % 100 % 100 %    2 Minute Post Liters of Oxygen 0 L 0 L             Psychological, QOL, Others - Outcomes: PHQ 2/9:    09/15/2023    2:35 PM 06/03/2023    3:30 PM  Depression screen PHQ 2/9  Decreased Interest 3 2  Down, Depressed, Hopeless 3 2  PHQ - 2 Score 6 4  Altered sleeping 2 1  Tired, decreased energy 2 2  Change in appetite 0 3  Feeling bad or failure about yourself  2 1  Trouble concentrating 1 1  Moving slowly or fidgety/restless 2 3  Suicidal thoughts 0 0  PHQ-9 Score 15 15  Difficult doing work/chores Somewhat difficult Somewhat difficult    Quality of Life:   Personal Goals: Goals  established at orientation with interventions provided to work toward goal.  Personal Goals and Risk Factors at Admission - 06/03/23 1343       Core Components/Risk Factors/Patient Goals on Admission    Weight Management Yes;Weight Maintenance    Admit Weight 118 lb 2.7 oz (53.6 kg)    Improve shortness of breath with ADL's Yes    Intervention Provide education, individualized exercise plan and daily activity instruction to help decrease symptoms of SOB with activities of daily living.    Expected Outcomes Short Term: Improve cardiorespiratory fitness to achieve a reduction of symptoms when performing ADLs;Long Term: Be able to perform more ADLs without symptoms or delay the onset of symptoms    Increase knowledge of respiratory medications and ability to use respiratory devices properly  Yes    Intervention Provide education and demonstration as needed of  appropriate use of medications, inhalers, and oxygen therapy.    Expected Outcomes Short Term: Achieves understanding of medications use. Understands that oxygen is a medication prescribed by physician. Demonstrates appropriate use of inhaler and oxygen therapy.;Long Term: Maintain appropriate use of medications, inhalers, and oxygen therapy.    Stress Yes    Intervention Offer individual and/or small group education and counseling on adjustment to heart disease, stress management and health-related lifestyle change. Teach and support self-help strategies.;Refer participants experiencing significant psychosocial distress to appropriate mental health specialists for further evaluation and treatment. When possible, include family members and significant others in education/counseling sessions.    Expected Outcomes Short Term: Participant demonstrates changes in health-related behavior, relaxation and other stress management skills, ability to obtain effective social support, and compliance with psychotropic medications if prescribed.;Long Term: Emotional wellbeing is indicated by absence of clinically significant psychosocial distress or social isolation.              Personal Goals Discharge:  Goals and Risk Factor Review     Row Name 06/10/23 1152 07/06/23 1517 08/05/23 0848 09/02/23 1515 09/26/23 1447     Core Components/Risk Factors/Patient Goals Review   Personal Goals Review Weight Management/Obesity;Improve shortness of breath with ADL's;Develop more efficient breathing techniques such as purse lipped breathing and diaphragmatic breathing and practicing self-pacing with activity.;Stress;Increase knowledge of respiratory medications and ability to use respiratory devices properly. Weight Management/Obesity;Improve shortness of breath with ADL's;Develop more efficient breathing techniques such as purse lipped breathing and diaphragmatic breathing and practicing self-pacing with  activity.;Stress;Increase knowledge of respiratory medications and ability to use respiratory devices properly. Weight Management/Obesity;Improve shortness of breath with ADL's Weight Management/Obesity;Improve shortness of breath with ADL's Weight Management/Obesity;Improve shortness of breath with ADL's;Stress   Review Unable to assess progress. Jaza has completed 1 class so far. Kalilah's goal of weight management is progressing. She is currently down ~2# since starting the program. She continues to work with our dietician to make healthy choices, increasing fruits and vegetables and reducing fats. Goal in progress on developing more efficient breathing techniques such as purse lipped breathing and diaphragmatic breathing; and practicing self-pacing with activity. We have been working & teaching Carney Bern on how to perform PLB and pacing herself while walking on the treadmill. Goal in progress on improving her shortness of breath with ADLs. Pennye has maintained her oxygen saturation >88% while exercising on room air. Goal progressing for increasing her knowledge of respiratory medications and ability to use respiratory devices properly. She is working with our respiratory therapist on how to correctly and properly use her inhalers. Goal progressing for decreased stress. Hurley has been out for  the last 2 weeks due to the unexpected loss of her daughter. We will work with her on stress relief through meditation and diaphragmatic breathing techniques. We will also be monitoring her and assessing her for any professional mental health resources and/or referrals. She will continue to benefit from PR for nutrition, education, exercise, and lifestyle modification. Serine's goal of weight management is progressing. She is currently down ~2# since starting the program. She continues to work with our dietician to make healthy choices, increasing fruits and vegetables and reducing fats. Goal met on developing more efficient  breathing techniques such as purse lipped breathing and diaphragmatic breathing; and practicing self-pacing with activity. Azelyn is able to demonstrate purse lip breathing when she gets SOB. She also knows how to pace herself while walking on the treadmill. Goal in progress on improving her shortness of breath with ADLs. Izumi has maintained her oxygen saturation >88% while exercising on room air. Goal met for increasing her knowledge of respiratory medications and ability to use respiratory devices properly. She has demonstrated proper MDI technique with the respiratory therapist. Goal progressing for decreased stress. Bithiah is having a hard time dealing with the loss of her daughter. Between her husband's declining health and her daughters passing this brings alot of stress. We will continue to work with her on stress relief through meditation and diaphragmatic breathing techniques. We will also be monitoring her and assessing her for any professional mental health resources and/or referrals. She will continue to benefit from PR for nutrition, education, exercise, and lifestyle modification. Goal in action for weight maintenance. Olanna lost ~4# around the time of her daughter's death. She has put the weight back on is currently maintaining. She is working with our dietician on a plan of action and is open to ideas and suggestions. Goal met on developing more efficient breathing techniques such as purse lipped breathing and diaphragmatic breathing; and practicing self-pacing with activity. Reeya is able to initiate PLB on her own with exertion. She also knows how to slow down and pace herself with short of breath. Goal in progress on improving her shortness of breath with ADLs. She has increased both her workload and METs while maintaining her oxygen saturation >88% on room air. We will continue to monitor and assess Hava's progress in the program. She will continue to benefit from PR for nutrition,  education, exercise, and lifestyle modification. Kaytlan graduated the Pulmonary Rehab program on 09/22/2023, completing 19 sessions. Core components/risk factors/patient goals re-evaluated are as follows: Goal met for weight maintenance. Evangelia lost ~4# around the time of her daughter's death but gained it back. She has put the weight back on and is currently maintaining at graduation. She worked with our dietician on a plan of action and is open to ideas and suggestions. Goal met on improving her shortness of breath with ADLs. She increased both her workload and METs while maintaining her oxygen saturation >88% on room air. Goal not met for decreasing stress. Due to situations in Rhoda's life, her stress has not reduced. She is working with a Paramedic and started a psychotropic medication.   Expected Outcomes See admission goals For Dot to maintain her weight, to improve her shortness of breath with ADLs, develop more efficient breathing techniques, increase her knowledge of respiratory meds, and decrease her stress & anxiety. For Puanani to maintain her weight and to improve her shortness of breath with ADLs For Annora to maintain her weight and to improve her shortness of breath with ADLs  For Tayloranne to maintain her weight, improve her shortness of breath with ADLs, and decrease stress/anxiety/depression post graduation from [facility]            Exercise Goals and Review:  Exercise Goals     Row Name 06/03/23 1339 06/09/23 0904 07/05/23 0922         Exercise Goals   Increase Physical Activity Yes Yes Yes     Intervention Provide advice, education, support and counseling about physical activity/exercise needs.;Develop an individualized exercise prescription for aerobic and resistive training based on initial evaluation findings, risk stratification, comorbidities and participant's personal goals. Provide advice, education, support and counseling about physical activity/exercise needs.;Develop  an individualized exercise prescription for aerobic and resistive training based on initial evaluation findings, risk stratification, comorbidities and participant's personal goals. Provide advice, education, support and counseling about physical activity/exercise needs.;Develop an individualized exercise prescription for aerobic and resistive training based on initial evaluation findings, risk stratification, comorbidities and participant's personal goals.     Expected Outcomes Short Term: Attend rehab on a regular basis to increase amount of physical activity.;Long Term: Exercising regularly at least 3-5 days a week.;Long Term: Add in home exercise to make exercise part of routine and to increase amount of physical activity. Short Term: Attend rehab on a regular basis to increase amount of physical activity.;Long Term: Exercising regularly at least 3-5 days a week.;Long Term: Add in home exercise to make exercise part of routine and to increase amount of physical activity. Short Term: Attend rehab on a regular basis to increase amount of physical activity.;Long Term: Exercising regularly at least 3-5 days a week.;Long Term: Add in home exercise to make exercise part of routine and to increase amount of physical activity.     Increase Strength and Stamina Yes Yes Yes     Intervention Provide advice, education, support and counseling about physical activity/exercise needs.;Develop an individualized exercise prescription for aerobic and resistive training based on initial evaluation findings, risk stratification, comorbidities and participant's personal goals. Provide advice, education, support and counseling about physical activity/exercise needs.;Develop an individualized exercise prescription for aerobic and resistive training based on initial evaluation findings, risk stratification, comorbidities and participant's personal goals. Provide advice, education, support and counseling about physical  activity/exercise needs.;Develop an individualized exercise prescription for aerobic and resistive training based on initial evaluation findings, risk stratification, comorbidities and participant's personal goals.     Expected Outcomes Short Term: Increase workloads from initial exercise prescription for resistance, speed, and METs.;Short Term: Perform resistance training exercises routinely during rehab and add in resistance training at home;Long Term: Improve cardiorespiratory fitness, muscular endurance and strength as measured by increased METs and functional capacity ( ) Short Term: Increase workloads from initial exercise prescription for resistance, speed, and METs.;Short Term: Perform resistance training exercises routinely during rehab and add in resistance training at home;Long Term: Improve cardiorespiratory fitness, muscular endurance and strength as measured by increased METs and functional capacity ( ) Short Term: Increase workloads from initial exercise prescription for resistance, speed, and METs.;Short Term: Perform resistance training exercises routinely during rehab and add in resistance training at home;Long Term: Improve cardiorespiratory fitness, muscular endurance and strength as measured by increased METs and functional capacity ( )     Able to understand and use rate of perceived exertion (RPE) scale Yes Yes Yes     Intervention Provide education and explanation on how to use RPE scale Provide education and explanation on how to use RPE scale Provide education and explanation on how to use RPE  scale     Expected Outcomes Short Term: Able to use RPE daily in rehab to express subjective intensity level;Long Term:  Able to use RPE to guide intensity level when exercising independently Short Term: Able to use RPE daily in rehab to express subjective intensity level;Long Term:  Able to use RPE to guide intensity level when exercising independently Short Term: Able to use RPE daily  in rehab to express subjective intensity level;Long Term:  Able to use RPE to guide intensity level when exercising independently     Able to understand and use Dyspnea scale Yes Yes Yes     Intervention Provide education and explanation on how to use Dyspnea scale Provide education and explanation on how to use Dyspnea scale Provide education and explanation on how to use Dyspnea scale     Expected Outcomes Short Term: Able to use Dyspnea scale daily in rehab to express subjective sense of shortness of breath during exertion;Long Term: Able to use Dyspnea scale to guide intensity level when exercising independently Short Term: Able to use Dyspnea scale daily in rehab to express subjective sense of shortness of breath during exertion;Long Term: Able to use Dyspnea scale to guide intensity level when exercising independently Short Term: Able to use Dyspnea scale daily in rehab to express subjective sense of shortness of breath during exertion;Long Term: Able to use Dyspnea scale to guide intensity level when exercising independently     Knowledge and understanding of Target Heart Rate Range (THRR) Yes Yes Yes     Intervention Provide education and explanation of THRR including how the numbers were predicted and where they are located for reference Provide education and explanation of THRR including how the numbers were predicted and where they are located for reference Provide education and explanation of THRR including how the numbers were predicted and where they are located for reference     Expected Outcomes Short Term: Able to state/look up THRR;Short Term: Able to use daily as guideline for intensity in rehab;Long Term: Able to use THRR to govern intensity when exercising independently Short Term: Able to state/look up THRR;Short Term: Able to use daily as guideline for intensity in rehab;Long Term: Able to use THRR to govern intensity when exercising independently Short Term: Able to state/look up  THRR;Short Term: Able to use daily as guideline for intensity in rehab;Long Term: Able to use THRR to govern intensity when exercising independently     Understanding of Exercise Prescription Yes Yes Yes     Intervention Provide education, explanation, and written materials on patient's individual exercise prescription Provide education, explanation, and written materials on patient's individual exercise prescription Provide education, explanation, and written materials on patient's individual exercise prescription     Expected Outcomes Short Term: Able to explain program exercise prescription;Long Term: Able to explain home exercise prescription to exercise independently Short Term: Able to explain program exercise prescription;Long Term: Able to explain home exercise prescription to exercise independently Short Term: Able to explain program exercise prescription;Long Term: Able to explain home exercise prescription to exercise independently              Exercise Goals Re-Evaluation:  Exercise Goals Re-Evaluation     Row Name 06/09/23 0904 07/05/23 0922 08/02/23 0840 09/05/23 0951 09/22/23 1510     Exercise Goal Re-Evaluation   Exercise Goals Review Increase Physical Activity;Able to understand and use Dyspnea scale;Understanding of Exercise Prescription;Increase Strength and Stamina;Knowledge and understanding of Target Heart Rate Range (THRR);Able to understand and use rate  of perceived exertion (RPE) scale Increase Physical Activity;Able to understand and use Dyspnea scale;Understanding of Exercise Prescription;Increase Strength and Stamina;Knowledge and understanding of Target Heart Rate Range (THRR);Able to understand and use rate of perceived exertion (RPE) scale Increase Physical Activity;Able to understand and use Dyspnea scale;Understanding of Exercise Prescription;Increase Strength and Stamina;Knowledge and understanding of Target Heart Rate Range (THRR);Able to understand and use rate of  perceived exertion (RPE) scale Increase Physical Activity;Able to understand and use Dyspnea scale;Understanding of Exercise Prescription;Increase Strength and Stamina;Knowledge and understanding of Target Heart Rate Range (THRR);Able to understand and use rate of perceived exertion (RPE) scale Increase Physical Activity;Able to understand and use Dyspnea scale;Understanding of Exercise Prescription;Increase Strength and Stamina;Knowledge and understanding of Target Heart Rate Range (THRR);Able to understand and use rate of perceived exertion (RPE) scale   Comments Pt to begin exercise 8/1. Will progress as tolerated Pt has completed 5 days of group exercise. She had good attendance until her daughter passed away recently. She has missed 2 sessions but plans to return in September. She is exercising on scifit bike for 15 min, level 2, 3.5 METs. She then walks on the treadmill for 15 min, speed 2.0, 0 incline, METs 2.53. Tolerating well, will progress as tolerated. Pt has completed 9 days of group exercise. She has perfect attendance since returning. She is exercising on scifit bike for 15 min, level 4, 3.5 METs. She then walks on the treadmill for 15 min, speed 2.5, 1 incline, METs 3.26. Tolerating well, motivated, will progress as tolerated. Artesha has completed 14 exercise sessions. She exercises for 15 min on the upright bike and treadmill. Ruthmary averages 3.7 METs level 4 on the upright bike and 3.7 METs at 2.8 mph and 2.5% on the treadmill. She performs the warmup and cooldown standing without limitations. Jannelle has been attending inconsistently due to doctor appointments and personal issues. She has not increased her workload for the upright bike but has for the treadmill. She tolerates the treadmill workload increase well. Will continue to monitor and progress as able. Remedios completed 19 exercise sessions. She exercised for 15 min on the upright bike and treadmill. Manya averaged 3.6 METs level 3 on the  upright bike and 3.7 METs at 2.5 mph and 2.0% on the treadmill.She had inconsistent attendance at times due to illness and appointments. She had significant progression but recently decreased her workloads. She increased her 6 min walk test by 44 %, 518 ft. She also increased her grip strength from 13 to 30 kg. Her SOB scale decreased from 47 to 27. Her CAT scale decreased from 18 to 11.   Expected Outcomes Through exercise at rehab and home, the patient will decrease shortness of breath with daily activities and feel confident in carrying out an exercise regimen at home Through exercise at rehab and home, the patient will decrease shortness of breath with daily activities and feel confident in carrying out an exercise regimen at home Through exercise at rehab and home, the patient will decrease shortness of breath with daily activities and feel confident in carrying out an exercise regimen at home Through exercise at rehab and home, the patient will decrease shortness of breath with daily activities and feel confident in carrying out an exercise regimen at home Through exercise at rehab and home, the patient will decrease shortness of breath with daily activities and feel confident in carrying out an exercise regimen at home            Nutrition & Weight -  Outcomes:  Pre Biometrics - 06/03/23 1334       Pre Biometrics   Grip Strength 13 kg             Post Biometrics - 09/22/23 1406        Post  Biometrics   Grip Strength 30 kg             Nutrition:  Nutrition Therapy & Goals - 09/05/23 1143       Nutrition Therapy   Diet Renal Diet      Personal Nutrition Goals   Nutrition Goal Patient to identify strategies for weight maintenance/weight gain of 0.5-2.0# per week.   goal in progress.   Comments Goal in progress. Patient has maintained weight since starting with our program. She does remain motivated to maintain her weight. Berkleigh continues to grieve the unexpected death  of her daughter. Patient has not attended pulmonary rehab since 10/15. Simisola reports following a vegetarian diet for >30 years; her primary protein sources are beans, dairy, etc. She is mindful of sodium. She is on ozempic for blood sugar control and recently stopped lantus; her A1c is well controlled at 5.9. She does report blood sugar lows 2-3x/week in the middle of the night. She continues to follow-up with [facility] for kidney transplant. She is now receiving retacrit injections. Hadly will continue to benefit from participation in pulmonary rehab for nutrition and exercise support.      Intervention Plan   Intervention Prescribe, educate and counsel regarding individualized specific dietary modifications aiming towards targeted core components such as weight, hypertension, lipid management, diabetes, heart failure and other comorbidities.;Nutrition handout(s) given to patient.    Expected Outcomes Short Term Goal: Understand basic principles of dietary content, such as calories, fat, sodium, cholesterol and nutrients.;Long Term Goal: Adherence to prescribed nutrition plan.             Nutrition Discharge:  Nutrition Assessments - 09/15/23 1039       Rate Your Plate Scores   Pre Score 74    Post Score 78             Education Questionnaire Score:  Knowledge Questionnaire Score - 09/15/23 1435       Knowledge Questionnaire Score   Post Score 17/18            Floretta graduated the Pulmonary Rehab program on 09/22/2023, completing 19 sessions. Jillyan's psychosocial re-evaluation is as follows: Thuy's PHQ 2-9 scores pre-Pulm Rehab 4-15, post scores 6-15. Since starting the program Pranisha lost her only child unexpectedly. Her husband is also in a skilled nursing facility post MVA with traumatic brain injury. Vencie stated he wasn't doing well. She has had a very tough couple of months and has started seeing a therapist and started on psychotropic medication. She denied any needs at  the time of discharge.   Goal met for weight maintenance. Arlie lost ~4# around the time of her daughter's death but gained it back. She has put the weight back on and is currently maintaining at graduation. She worked with our dietician on a plan of action and is open to ideas and suggestions. Goal met on improving her shortness of breath with ADLs. She increased both her workload and METs while maintaining her oxygen saturation >88% on room air. Goal not met for decreasing stress. Due to situations in Sidnee's life, her stress has not reduced. She is working with a Paramedic and started a psychotropic medication.

## 2023-09-30 ENCOUNTER — Encounter (HOSPITAL_COMMUNITY): Payer: Medicare HMO

## 2023-09-30 ENCOUNTER — Ambulatory Visit (HOSPITAL_COMMUNITY)
Admission: RE | Admit: 2023-09-30 | Discharge: 2023-09-30 | Disposition: A | Discharge status: Home / Self Care | Payer: Medicare HMO | Source: Ambulatory Visit | Attending: Nephrology | Admitting: Nephrology

## 2023-09-30 VITALS — BP 104/73 | HR 102 | Temp 97.6°F | Resp 16

## 2023-09-30 DIAGNOSIS — N183 Chronic kidney disease, stage 3 unspecified: Secondary | ICD-10-CM | POA: Diagnosis not present

## 2023-09-30 LAB — POCT HEMOGLOBIN-HEMACUE: Hemoglobin: 11.6 g/dL — ABNORMAL LOW (ref 12.0–15.0)

## 2023-09-30 MED ORDER — EPOETIN ALFA-EPBX 10000 UNIT/ML IJ SOLN
INTRAMUSCULAR | Status: AC
  Filled 2023-09-30: qty 1

## 2023-09-30 MED ORDER — EPOETIN ALFA-EPBX 10000 UNIT/ML IJ SOLN
10000.0000 [IU] | INTRAMUSCULAR | Status: DC
Start: 2023-09-30 — End: 2023-10-01
  Administered 2023-09-30: 10000 [IU] via SUBCUTANEOUS

## 2023-10-05 DIAGNOSIS — F331 Major depressive disorder, recurrent, moderate: Secondary | ICD-10-CM | POA: Diagnosis not present

## 2023-10-05 DIAGNOSIS — F411 Generalized anxiety disorder: Secondary | ICD-10-CM | POA: Diagnosis not present

## 2023-10-10 ENCOUNTER — Other Ambulatory Visit: Payer: Self-pay

## 2023-10-10 MED ORDER — OLMESARTAN MEDOXOMIL 40 MG PO TABS: 40.0000 mg | ORAL_TABLET | Freq: Every day | ORAL | 2 refills | Status: AC

## 2023-10-10 MED ORDER — SIMVASTATIN 40 MG PO TABS: 40.0000 mg | ORAL_TABLET | Freq: Every day | ORAL | 2 refills | Status: DC

## 2023-10-12 DIAGNOSIS — F331 Major depressive disorder, recurrent, moderate: Secondary | ICD-10-CM | POA: Diagnosis not present

## 2023-10-12 DIAGNOSIS — F411 Generalized anxiety disorder: Secondary | ICD-10-CM | POA: Diagnosis not present

## 2023-10-17 DIAGNOSIS — F331 Major depressive disorder, recurrent, moderate: Secondary | ICD-10-CM | POA: Diagnosis not present

## 2023-10-17 DIAGNOSIS — F411 Generalized anxiety disorder: Secondary | ICD-10-CM | POA: Diagnosis not present

## 2023-10-19 ENCOUNTER — Emergency Department (HOSPITAL_COMMUNITY): Payer: Medicare HMO

## 2023-10-19 ENCOUNTER — Observation Stay (HOSPITAL_COMMUNITY)
Admission: EM | Admit: 2023-10-19 | Discharge: 2023-10-21 | Disposition: A | Discharge status: Home / Self Care | Payer: Medicare HMO | Attending: Family Medicine | Admitting: Family Medicine

## 2023-10-19 DIAGNOSIS — I25118 Atherosclerotic heart disease of native coronary artery with other forms of angina pectoris: Secondary | ICD-10-CM | POA: Diagnosis not present

## 2023-10-19 DIAGNOSIS — E782 Mixed hyperlipidemia: Secondary | ICD-10-CM | POA: Diagnosis not present

## 2023-10-19 DIAGNOSIS — Z7982 Long term (current) use of aspirin: Secondary | ICD-10-CM | POA: Diagnosis not present

## 2023-10-19 DIAGNOSIS — Z8673 Personal history of transient ischemic attack (TIA), and cerebral infarction without residual deficits: Secondary | ICD-10-CM | POA: Diagnosis not present

## 2023-10-19 DIAGNOSIS — R0602 Shortness of breath: Secondary | ICD-10-CM | POA: Insufficient documentation

## 2023-10-19 DIAGNOSIS — I132 Hypertensive heart and chronic kidney disease with heart failure and with stage 5 chronic kidney disease, or end stage renal disease: Secondary | ICD-10-CM | POA: Diagnosis not present

## 2023-10-19 DIAGNOSIS — Z955 Presence of coronary angioplasty implant and graft: Secondary | ICD-10-CM | POA: Diagnosis not present

## 2023-10-19 DIAGNOSIS — Z95810 Presence of automatic (implantable) cardiac defibrillator: Secondary | ICD-10-CM | POA: Diagnosis not present

## 2023-10-19 DIAGNOSIS — R000 Tachycardia, unspecified: Principal | ICD-10-CM

## 2023-10-19 DIAGNOSIS — F419 Anxiety disorder, unspecified: Secondary | ICD-10-CM | POA: Diagnosis not present

## 2023-10-19 DIAGNOSIS — I25709 Atherosclerosis of coronary artery bypass graft(s), unspecified, with unspecified angina pectoris: Secondary | ICD-10-CM | POA: Diagnosis not present

## 2023-10-19 DIAGNOSIS — Z794 Long term (current) use of insulin: Secondary | ICD-10-CM | POA: Insufficient documentation

## 2023-10-19 DIAGNOSIS — R002 Palpitations: Principal | ICD-10-CM | POA: Diagnosis present

## 2023-10-19 DIAGNOSIS — Z1152 Encounter for screening for COVID-19: Secondary | ICD-10-CM | POA: Insufficient documentation

## 2023-10-19 DIAGNOSIS — Z951 Presence of aortocoronary bypass graft: Secondary | ICD-10-CM | POA: Insufficient documentation

## 2023-10-19 DIAGNOSIS — N186 End stage renal disease: Secondary | ICD-10-CM | POA: Diagnosis not present

## 2023-10-19 DIAGNOSIS — R0689 Other abnormalities of breathing: Secondary | ICD-10-CM | POA: Diagnosis not present

## 2023-10-19 DIAGNOSIS — E1122 Type 2 diabetes mellitus with diabetic chronic kidney disease: Secondary | ICD-10-CM | POA: Diagnosis not present

## 2023-10-19 DIAGNOSIS — J45909 Unspecified asthma, uncomplicated: Secondary | ICD-10-CM | POA: Diagnosis not present

## 2023-10-19 DIAGNOSIS — I5032 Chronic diastolic (congestive) heart failure: Secondary | ICD-10-CM | POA: Diagnosis not present

## 2023-10-19 DIAGNOSIS — I10 Essential (primary) hypertension: Secondary | ICD-10-CM | POA: Diagnosis present

## 2023-10-19 DIAGNOSIS — Z79899 Other long term (current) drug therapy: Secondary | ICD-10-CM | POA: Diagnosis not present

## 2023-10-19 DIAGNOSIS — R42 Dizziness and giddiness: Secondary | ICD-10-CM | POA: Diagnosis not present

## 2023-10-19 DIAGNOSIS — Z950 Presence of cardiac pacemaker: Secondary | ICD-10-CM | POA: Insufficient documentation

## 2023-10-19 DIAGNOSIS — E785 Hyperlipidemia, unspecified: Secondary | ICD-10-CM | POA: Diagnosis present

## 2023-10-19 DIAGNOSIS — E119 Type 2 diabetes mellitus without complications: Secondary | ICD-10-CM

## 2023-10-19 LAB — HEPATIC FUNCTION PANEL
ALT: 23 U/L (ref 0–44)
AST: 28 U/L (ref 15–41)
Albumin: 3.7 g/dL (ref 3.5–5.0)
Alkaline Phosphatase: 42 U/L (ref 38–126)
Bilirubin, Direct: <0.1 mg/dL (ref 0.0–0.2)
Total Bilirubin: 0.7 mg/dL (ref <1.2)
Total Protein: 6.6 g/dL (ref 6.5–8.1)

## 2023-10-19 LAB — CBG MONITORING, ED
Glucose-Capillary: 90 mg/dL (ref 70–99)
Glucose-Capillary: 81 mg/dL (ref 70–99)

## 2023-10-19 LAB — CBC
HCT: 37 % (ref 36.0–46.0)
Hemoglobin: 12.5 g/dL (ref 12.0–15.0)
MCH: 29.3 pg (ref 26.0–34.0)
MCHC: 33.8 g/dL (ref 30.0–36.0)
MCV: 86.7 fL (ref 80.0–100.0)
Platelets: 202 10*3/uL (ref 150–400)
RBC: 4.27 MIL/uL (ref 3.87–5.11)
RDW: 12.7 % (ref 11.5–15.5)
WBC: 6.4 10*3/uL (ref 4.0–10.5)
nRBC: 0 % (ref 0.0–0.2)

## 2023-10-19 LAB — BASIC METABOLIC PANEL
Anion gap: 11 (ref 5–15)
BUN: 38 mg/dL — ABNORMAL HIGH (ref 8–23)
CO2: 30 mmol/L (ref 22–32)
Calcium: 9.5 mg/dL (ref 8.9–10.3)
Chloride: 99 mmol/L (ref 98–111)
Creatinine, Ser: 2.58 mg/dL — ABNORMAL HIGH (ref 0.44–1.00)
GFR, Estimated: 19 mL/min — ABNORMAL LOW (ref >60)
Glucose, Bld: 84 mg/dL (ref 70–99)
Potassium: 4.1 mmol/L (ref 3.5–5.1)
Sodium: 140 mmol/L (ref 135–145)

## 2023-10-19 LAB — MAGNESIUM: Magnesium: 2.3 mg/dL (ref 1.7–2.4)

## 2023-10-19 LAB — TROPONIN I (HIGH SENSITIVITY)
Troponin I (High Sensitivity): 44 ng/L — ABNORMAL HIGH (ref <18)
Troponin I (High Sensitivity): 38 ng/L — ABNORMAL HIGH (ref <18)

## 2023-10-19 LAB — TSH: TSH: 1.731 u[IU]/mL (ref 0.350–4.500)

## 2023-10-19 LAB — BRAIN NATRIURETIC PEPTIDE: B Natriuretic Peptide: 22.2 pg/mL (ref 0.0–100.0)

## 2023-10-19 MED ORDER — VENLAFAXINE HCL 50 MG PO TABS
100.0000 mg | ORAL_TABLET | Freq: Every day | ORAL | Status: DC
  Administered 2023-10-20 – 2023-10-21 (×2): 100 mg via ORAL
  Filled 2023-10-19 (×3): qty 2

## 2023-10-19 MED ORDER — HYDRALAZINE HCL 25 MG PO TABS
25.0000 mg | ORAL_TABLET | Freq: Two times a day (BID) | ORAL | Status: DC
  Administered 2023-10-20 – 2023-10-21 (×4): 25 mg via ORAL
  Filled 2023-10-19 (×4): qty 1

## 2023-10-19 MED ORDER — SIMVASTATIN 20 MG PO TABS
40.0000 mg | ORAL_TABLET | Freq: Every day | ORAL | Status: DC
  Administered 2023-10-20 – 2023-10-21 (×2): 40 mg via ORAL
  Filled 2023-10-19 (×2): qty 2

## 2023-10-19 MED ORDER — ALPRAZOLAM 0.5 MG PO TABS
1.0000 mg | ORAL_TABLET | Freq: Three times a day (TID) | ORAL | Status: DC
  Administered 2023-10-20 – 2023-10-21 (×5): 1 mg via ORAL
  Filled 2023-10-19: qty 2
  Filled 2023-10-19 (×2): qty 4
  Filled 2023-10-19 (×2): qty 2

## 2023-10-19 MED ORDER — SODIUM CHLORIDE 0.9 % IV SOLN: INTRAVENOUS | Status: AC

## 2023-10-19 MED ORDER — VENLAFAXINE HCL 50 MG PO TABS: 100.0000 mg | ORAL_TABLET | ORAL | Status: DC

## 2023-10-19 MED ORDER — EZETIMIBE 10 MG PO TABS
10.0000 mg | ORAL_TABLET | Freq: Every day | ORAL | Status: DC
  Administered 2023-10-20 – 2023-10-21 (×2): 10 mg via ORAL
  Filled 2023-10-19 (×2): qty 1

## 2023-10-19 MED ORDER — POTASSIUM CHLORIDE CRYS ER 20 MEQ PO TBCR
20.0000 meq | EXTENDED_RELEASE_TABLET | Freq: Every day | ORAL | Status: DC
  Administered 2023-10-20 – 2023-10-21 (×2): 20 meq via ORAL
  Filled 2023-10-19 (×2): qty 1

## 2023-10-19 MED ORDER — FUROSEMIDE 40 MG PO TABS
160.0000 mg | ORAL_TABLET | Freq: Every day | ORAL | Status: DC
  Administered 2023-10-20 – 2023-10-21 (×2): 160 mg via ORAL
  Filled 2023-10-19: qty 8
  Filled 2023-10-19: qty 4

## 2023-10-19 MED ORDER — PANTOPRAZOLE SODIUM 40 MG PO TBEC
40.0000 mg | DELAYED_RELEASE_TABLET | Freq: Every day | ORAL | Status: DC
  Administered 2023-10-20 – 2023-10-21 (×2): 40 mg via ORAL
  Filled 2023-10-19 (×2): qty 1

## 2023-10-19 MED ORDER — ISOSORBIDE MONONITRATE ER 60 MG PO TB24
60.0000 mg | ORAL_TABLET | Freq: Every day | ORAL | Status: DC
  Administered 2023-10-20 – 2023-10-21 (×2): 60 mg via ORAL
  Filled 2023-10-19: qty 2
  Filled 2023-10-19: qty 1

## 2023-10-19 MED ORDER — ASPIRIN 325 MG PO TBEC
325.0000 mg | DELAYED_RELEASE_TABLET | Freq: Every day | ORAL | Status: DC
  Administered 2023-10-20 – 2023-10-21 (×2): 325 mg via ORAL
  Filled 2023-10-19 (×2): qty 1

## 2023-10-19 MED ORDER — DOXAZOSIN MESYLATE 4 MG PO TABS
4.0000 mg | ORAL_TABLET | Freq: Every day | ORAL | Status: DC
  Administered 2023-10-20 (×2): 4 mg via ORAL
  Filled 2023-10-19 (×3): qty 1

## 2023-10-19 MED ORDER — METOPROLOL SUCCINATE ER 100 MG PO TB24
200.0000 mg | ORAL_TABLET | Freq: Every day | ORAL | Status: DC
  Administered 2023-10-20 – 2023-10-21 (×2): 200 mg via ORAL
  Filled 2023-10-19 (×2): qty 2

## 2023-10-19 MED ORDER — LORATADINE 10 MG PO TABS
10.0000 mg | ORAL_TABLET | Freq: Every day | ORAL | Status: DC
  Administered 2023-10-20 – 2023-10-21 (×2): 10 mg via ORAL
  Filled 2023-10-19 (×2): qty 1

## 2023-10-19 MED ORDER — IRBESARTAN 150 MG PO TABS
300.0000 mg | ORAL_TABLET | Freq: Every day | ORAL | Status: DC
  Administered 2023-10-20 – 2023-10-21 (×2): 300 mg via ORAL
  Filled 2023-10-19: qty 2
  Filled 2023-10-19: qty 1

## 2023-10-19 MED ORDER — CLOPIDOGREL BISULFATE 75 MG PO TABS
75.0000 mg | ORAL_TABLET | Freq: Every day | ORAL | Status: DC
  Administered 2023-10-20 – 2023-10-21 (×2): 75 mg via ORAL
  Filled 2023-10-19 (×2): qty 1

## 2023-10-19 MED ORDER — ENOXAPARIN SODIUM 30 MG/0.3ML IJ SOSY
30.0000 mg | PREFILLED_SYRINGE | Freq: Every day | INTRAMUSCULAR | Status: DC
Start: 2023-10-20 — End: 2023-10-21
  Administered 2023-10-20 – 2023-10-21 (×2): 30 mg via SUBCUTANEOUS
  Filled 2023-10-19 (×2): qty 0.3

## 2023-10-19 MED ORDER — VENLAFAXINE HCL 75 MG PO TABS
200.0000 mg | ORAL_TABLET | Freq: Every day | ORAL | Status: DC
  Administered 2023-10-20 – 2023-10-21 (×2): 200 mg via ORAL
  Filled 2023-10-19 (×2): qty 1

## 2023-10-19 NOTE — ED Notes (Signed)
Pt was given ice °

## 2023-10-19 NOTE — Assessment & Plan Note (Signed)
Controlled.  Last hemoglobin A1c of 5.8 on 09/2023 -Has been controlled on Ozempic and diet

## 2023-10-19 NOTE — ED Provider Notes (Signed)
EMERGENCY DEPARTMENT AT [facility] Provider Note   CSN: [id number] Arrival date & time: 10/19/23  1453     History  No chief complaint on file.   [name] is a 72 y.o. female.  HPI 72 year old female history of diabetes, prior TIA, diastolic heart failure, CKD, CAD, asthma, anxiety, pacemaker placement presenting for palpitations.  She states for the last 3 days she has been tachycardic.  Symptoms up to the 130s to 150s.  She feels like her heart has been racing.  She is not any chest pain or shortness of breath.  No nausea or vomiting or diarrhea.  Normal p.o. intake.  No abdominal pain.  No recent bleeding.  States she is on aspirin and Plavix.  Denies any recent medication changes.  Normal urination.  No leg swelling.  No orthopnea or DOE.  She does report prior blood clot but is not sure where or when.  With EMS she was given 500 mL of fluid.     Home Medications Prior to Admission medications   Medication Sig Start Date End Date Taking? Authorizing Provider  albuterol (PROVENTIL HFA;VENTOLIN HFA) 108 (90 BASE) MCG/ACT inhaler Inhale 2 puffs into the lungs every 4 (four) hours as needed for wheezing or shortness of breath.   Yes [provider]  ALPRAZolam Prudy Feeler) 1 MG tablet Take 1 mg by mouth 3 (three) times daily.  09/20/18  Yes [provider]  Artificial Tear GEL Place 2 drops into both eyes daily as needed (dry eyes).    Yes [provider]  aspirin 325 MG EC tablet Take 325 mg by mouth daily.   Yes [provider]  BIOTIN PO Take 1 tablet by mouth daily.   Yes [provider]  cetirizine (ZYRTEC) 10 MG tablet Take 10 mg by mouth daily.   Yes [provider]  Cholecalciferol (VITAMIN D-3) 1000 UNITS CAPS Take 1,000 Units by mouth daily.    Yes [provider]  clopidogrel (PLAVIX) 75 MG tablet TAKE 1 TABLET EVERY DAY 09/22/23  Yes Sheilah Pigeon, PA-C  doxazosin (CARDURA) 4 MG  tablet Take 4 mg by mouth at bedtime.    Yes [provider]  EPINEPHrine 0.3 mg/0.3 mL IJ SOAJ injection Inject 0.3 mg into the muscle as needed for anaphylaxis.  08/08/18  Yes [provider]  ezetimibe (ZETIA) 10 MG tablet TAKE 1 TABLET EVERY DAY 09/22/23  Yes Sheilah Pigeon, PA-C  Flaxseed, Linseed, (FLAXSEED OIL) 1200 MG CAPS Take 1,200 mg by mouth daily.   Yes [provider]  fluticasone (FLONASE) 50 MCG/ACT nasal spray Place 2 sprays into both nostrils daily as needed for allergies or rhinitis.   Yes [provider]  furosemide (LASIX) 80 MG tablet TAKE 1 TABLET TWICE A DAY. MAY TAKE AN ADDITIONAL 80MG  (1 TABLET) AS NEEDED FOR EDEMA Patient taking differently: Take 160 mg by mouth daily. TAKE 1 TABLET TWICE A DAY. MAY TAKE AN ADDITIONAL 80MG  (1 TABLET) AS NEEDED FOR EDEMA 09/22/23  Yes Sheilah Pigeon, PA-C  hydrALAZINE (APRESOLINE) 25 MG tablet TAKE 1 TABLET TWICE DAILY 09/22/23  Yes Sheilah Pigeon, PA-C  isosorbide mononitrate (IMDUR) 60 MG 24 hr tablet TAKE 1 TABLET EVERY DAY 09/22/23  Yes Sheilah Pigeon, PA-C  Ketotifen Fumarate (ALAWAY OP) Place 1 drop into both eyes daily as needed (allergies).    Yes [provider]  metoprolol succinate (TOPROL-XL) 100 MG 24 hr tablet TAKE 2 TABLETS EVERY  DAY WITH OR IMMEDIATELY FOLLOWING A MEAL 09/22/23  Yes Sheilah Pigeon, PA-C  Multiple Vitamin (MULTIVITAMIN WITH MINERALS) TABS Take 1 tablet by mouth daily.   Yes [provider]  nitroGLYCERIN (NITROSTAT) 0.4 MG SL tablet Place 1 tablet under the tongue every 5 minutes as needed for chest pain, max 3 doses, go to er if no relief 01/29/22  Yes Lyn Records, MD  olmesartan (BENICAR) 40 MG tablet Take 1 tablet (40 mg total) by mouth daily. 10/10/23  Yes Runell Gess, MD  OZEMPIC, 0.25 OR 0.5 MG/DOSE, 2 MG/1.5ML SOPN Inject 0.25 mg into the skin once a week. Sunday 08/30/19  Yes [provider]  pantoprazole (PROTONIX) 40  MG tablet TAKE 1 TABLET BY MOUTH  DAILY 09/02/21  Yes Lyn Records, MD  potassium chloride (KLOR-CON M) 10 MEQ tablet TAKE 2 TABLETS TWICE A DAY (KEEP MD APPOINTMENT FOR REFILLS) Patient taking differently: Take 20 mEq by mouth daily. TAKE 2 TABLETS TWICE A DAY (KEEP MD APPOINTMENT FOR REFILLS) 06/06/23  Yes Runell Gess, MD  simvastatin (ZOCOR) 40 MG tablet Take 1 tablet (40 mg total) by mouth at bedtime. Patient taking differently: Take 40 mg by mouth daily. 10/10/23  Yes Runell Gess, MD  sodium chloride (MURO 128) 5 % ophthalmic ointment Place 1 drop into both eyes at bedtime as needed for eye irritation. For dry eyes   Yes [provider]  traMADol (ULTRAM) 50 MG tablet Take 100 mg by mouth every 6 (six) hours as needed (MIGRAINES).   Yes [provider]  venlafaxine (EFFEXOR) 100 MG tablet Take 100-200 mg by mouth See admin instructions. TAKE 200 mg by mouth in the morning and take 100 mg at noon 04/12/11  Yes [provider]  vitamin A 7500 UNIT capsule Take 2,400 Units by mouth daily.   Yes [provider]  Alcohol Swabs (B-D SINGLE USE SWABS REGULAR) PADS  03/05/19   [provider]  DROPLET PEN NEEDLES 32G X 4 MM MISC  03/05/19   [provider]  LANTUS SOLOSTAR 100 UNIT/ML Solostar Pen Inject 6 Units into the skin at bedtime. Patient not taking: Reported on 10/19/2023 04/10/19   [provider]  TRUE METRIX BLOOD GLUCOSE TEST test strip  02/27/19   [provider]  TRUEplus Lancets 33G MISC  02/27/19   [provider]      Allergies    Calcium channel blockers, Codeine, Lyrica [pregabalin], Other, Ramipril, Ticlid [ticlopidine hcl], Morphine and codeine, Tape, Cucumber extract, Digoxin, Digoxin and related, Diltiazem hcl, Diltiazem hcl er beads, Metformin hcl, Metolazone, Milk-related compounds, Morphine sulfate, Myrbetriq [mirabegron], Nsaids, Onglyza [saxagliptin], Penicillin v, Penicillins,  Spironolactone, Sulfa antibiotics, Tetracycline hcl, Tetracyclines & related, Ticlopidine, Verapamil, Vicodin [hydrocodone-acetaminophen], and Latex    Review of Systems   Review of Systems Review of systems completed and notable as per HPI.  ROS otherwise negative.   Physical Exam Updated Vital Signs BP (!) 140/97   Pulse 87   Temp 98.1 F (36.7 C) (Oral)   Resp 15   Ht 5\' 3"  (1.6 m)   Wt 51.3 kg   SpO2 99%   BMI 20.02 kg/m  Physical Exam Vitals and nursing note reviewed.  Constitutional:      General: She is not in acute distress.    Appearance: She is well-developed.  HENT:     Head: Normocephalic and atraumatic.     Mouth/Throat:     Mouth: Mucous membranes are moist.  Pharynx: Oropharynx is clear.  Eyes:     Extraocular Movements: Extraocular movements intact.     Conjunctiva/sclera: Conjunctivae normal.     Pupils: Pupils are equal, round, and reactive to light.  Cardiovascular:     Rate and Rhythm: Regular rhythm. Tachycardia present.     Pulses: Normal pulses.     Heart sounds: Normal heart sounds. No murmur heard. Pulmonary:     Effort: Pulmonary effort is normal. No respiratory distress.     Breath sounds: Normal breath sounds.  Abdominal:     Palpations: Abdomen is soft.     Tenderness: There is no abdominal tenderness.  Musculoskeletal:        General: No swelling.     Cervical back: Neck supple.     Right lower leg: No edema.     Left lower leg: No edema.  Skin:    General: Skin is warm and dry.     Capillary Refill: Capillary refill takes less than 2 seconds.  Neurological:     General: No focal deficit present.     Mental Status: She is alert and oriented to person, place, and time. Mental status is at baseline.  Psychiatric:        Mood and Affect: Mood normal.     ED Results / Procedures / Treatments   Labs (all labs ordered are listed, but only abnormal results are displayed) Labs Reviewed  BASIC METABOLIC PANEL - Abnormal; Notable  for the following components:      Result Value   BUN 38 (*)    Creatinine, Ser 2.58 (*)    GFR, Estimated 19 (*)    All other components within normal limits  TROPONIN I (HIGH SENSITIVITY) - Abnormal; Notable for the following components:   Troponin I (High Sensitivity) 44 (*)    All other components within normal limits  TROPONIN I (HIGH SENSITIVITY) - Abnormal; Notable for the following components:   Troponin I (High Sensitivity) 38 (*)    All other components within normal limits  CULTURE, BLOOD (ROUTINE X 2)  CULTURE, BLOOD (ROUTINE X 2)  RESP PANEL BY RT-PCR (RSV, FLU A&B, COVID)  [id number]  CBC  BRAIN NATRIURETIC PEPTIDE  HEPATIC FUNCTION PANEL  TSH  MAGNESIUM  CBC  BASIC METABOLIC PANEL  CBG MONITORING, ED  CBG MONITORING, ED    EKG EKG Interpretation Date/Time:  Wednesday October 19 2023 15:07:05 EST Ventricular Rate:  121 PR Interval:  116 QRS Duration:  94 QT Interval:  340 QTC Calculation: 482 R Axis:   -2  Text Interpretation: Atrial-sensed ventricular-paced rhythm Abnormal ECG When compared with ECG of 23-Aug-2023 14:28, PREVIOUS ECG IS PRESENT Confirmed by Fulton Reek [phone]) on 10/19/2023 4:27:34 PM  Radiology DG Chest 2 View  Result Date: 10/19/2023 CLINICAL DATA:  Shortness of breath. EXAM: CHEST - 2 VIEW COMPARISON:  Apr 06, 2022. FINDINGS: The heart size and mediastinal contours are within normal limits. Stable left-sided defibrillator. Status post coronary artery bypass graft. Both lungs are clear. The visualized skeletal structures are unremarkable. IMPRESSION: No active cardiopulmonary disease. Electronically Signed   By: Lupita Raider M.D.   On: 10/19/2023 16:46    Procedures Procedures    Medications Ordered in ED Medications  enoxaparin (LOVENOX) injection 30 mg (has no administration in time range)  aspirin EC tablet 325 mg (has no administration in time range)  doxazosin (CARDURA) tablet 4 mg (4 mg Oral Given 10/20/23 0028)   ezetimibe (ZETIA) tablet 10 mg (has no administration  in time range)  furosemide (LASIX) tablet 160 mg (has no administration in time range)  isosorbide mononitrate (IMDUR) 24 hr tablet 60 mg (has no administration in time range)  hydrALAZINE (APRESOLINE) tablet 25 mg (25 mg Oral Given 10/20/23 0025)  simvastatin (ZOCOR) tablet 40 mg (has no administration in time range)  irbesartan (AVAPRO) tablet 300 mg (has no administration in time range)  ALPRAZolam (XANAX) tablet 1 mg (1 mg Oral Given 10/20/23 0027)  pantoprazole (PROTONIX) EC tablet 40 mg (has no administration in time range)  clopidogrel (PLAVIX) tablet 75 mg (has no administration in time range)  potassium chloride SA (KLOR-CON M) CR tablet 20 mEq (has no administration in time range)  loratadine (CLARITIN) tablet 10 mg (has no administration in time range)  metoprolol succinate (TOPROL-XL) 24 hr tablet 200 mg (has no administration in time range)  0.9 %  sodium chloride infusion ( Intravenous New Bag/Given 10/20/23 0035)  venlafaxine (EFFEXOR) tablet 200 mg (has no administration in time range)    And  venlafaxine [facility]) tablet 100 mg (has no administration in time range)    ED Course/ Medical Decision Making/ A&P Clinical Course as of 10/20/23 0036  Wed Oct 19, 2023  1913 Discussed with hospitalist for admission. [JD]    Clinical Course User Index [JD] Laurence Spates, MD                                 Medical Decision Making Amount and/or Complexity of Data Reviewed Labs: ordered. Radiology: ordered.  Risk Decision regarding hospitalization.   Medical Decision Making:   ULYSSES DEWAARD is a 72 y.o. female who presented to the ED today with 3 days of palpitations.  Denies chest pain or shortness of breath.  Here she is tachycardic.  EKG shows atrial sensed ventricular paced rhythm with tachycardia.  She has some subtle lateral ST depression.  She is deferring chest pain, although initial troponin is 44  which is elevated will trend.  She appears euvolemic.  Will add on TSH, magnesium as well.  No fever or signs of sepsis.   Patient placed on continuous vitals and telemetry monitoring while in ED which was reviewed periodically.  Reviewed and confirmed nursing documentation for past medical history, family history, social history.  Reassessment and Plan:   Workup is largely reassuring.  X-ray is unremarkable.  Renal functions at baseline.  TSH, electrolytes are unremarkable.  Troponin slightly elevated but downtrending.  Her tachycardia is improved.  Consider possible PE but she has significant renal dysfunction which limits ability to get this.  I think she is admission for further workup.  Pacemaker interrogation is pending.  I talked with the hospitalist and she was admitted for further management.   Patient's presentation is most consistent with acute complicated illness / injury requiring diagnostic workup.           Final Clinical Impression(s) / ED Diagnoses Final diagnoses:  Tachycardia    Rx / DC Orders ED Discharge Orders     None         Laurence Spates, MD 10/20/23 [phone]

## 2023-10-19 NOTE — Assessment & Plan Note (Signed)
-  continue Zetia. ?

## 2023-10-19 NOTE — Assessment & Plan Note (Signed)
-  continue Plavix, Zetia and Imdur

## 2023-10-19 NOTE — Assessment & Plan Note (Addendum)
-   Had borderline blood pressure with SBP in the 100s in the field and fluid responsive here. Likely dehydrated as she is on 160mg  of Lasix.  -will resume hydralazine, Imdur, ACE-I, doxazosin, Lasix tomorrow -continue beta-blocker -keep on gentle IV fluid hydration overnight

## 2023-10-19 NOTE — ED Triage Notes (Signed)
Pt [facility] for palp and SOB x 3 days.  No pain complaints.  Dizziness on standing today. A&O,   HR 140-150 on scene, BP 100/60  bolus given.  HR 116 ST,  117/77 100% RA  18G L. AC

## 2023-10-19 NOTE — Progress Notes (Addendum)
   10/19/23 1656  Spiritual Encounters  Type of Visit Initial  Care provided to: Pt and family  Conversation partners present during encounter Nurse  Reason for visit Routine spiritual support  OnCall Visit No   Responded to page. Visited with family until clergy person arrived.

## 2023-10-19 NOTE — H&P (Addendum)
History and Physical    Patient: [name] ZOX:[id number] DOB: [birth year] DOA: 10/19/2023 DOS: the patient was seen and examined on 10/20/2023 PCP: Merri Brunette, MD  Patient coming from: Home  Chief Complaint: No chief complaint on file.  HPI: [name] is a 72 y.o. female with medical history significant of ESRD, T2DM, HTN, CAD s/p CABG with subsequent stent, HFpEF s/p ICD, CVA, depression and anxiety who presents with palpitations, dyspnea and dizziness.   Symptoms started about 3 days ago. She woke up with palpitations and shortness of breath. Symptoms improved after taking her medications which she is very compliant with. Same symptoms occurred the day after. She also feels very run down and fatigued. Feels dizzy. Has noted resting tremors. No chest pain. No cough or runny nose. No nausea, vomiting or diarrhea. No abdominal pain. No dysuria, increase frequent. Has chronic urgency.  No recent travel, LE edema or calf pain although is sedentary at home. No tobacco use.   On EMS arrival, HR 140-150, bp 100/60. BP and HR improved with fluids.  She was otherwise afebrile without leukocytosis.  CMP with elevated creatinine of 2.5 but is improved from remote prior.  LFTs negative.   Mg is within normal limits.   Troponin elevated but flat at 44-38. EKG in sinus tachycardia but atrial sensed ventricular paced rhythm.   Hospitalist consulted for continual management and workup of symptoms.  Review of Systems: As mentioned in the history of present illness. All other systems reviewed and are negative. Past Medical History:  Diagnosis Date   AICD (automatic cardioverter/defibrillator) present    [employer]- Dr. Rosette Reveal follows   Anginal pain (HCC)    Anxiety    Asthma    Back pain    "pinched nerve-lower back" - Dr. Ethelene Hal follows.   Biventricular implantable cardioverter-defibrillator in situ 06/18/2011   Cerebral infarction (HCC) 11/11/2012   Cervical dysplasia     Chronic diastolic heart failure (HCC) 03/22/2016   Chronic kidney disease    Dr. Allena Katz follows.   Chronic renal insufficiency, stage III (moderate) (HCC) 11/11/2012   Complication of anesthesia    "I wake up during surgeries" (02/14/2013)   Coronary artery disease involving coronary bypass graft of native heart with angina pectoris (HCC) 06/18/2011   Depression    DM (diabetes mellitus) (HCC) 11/11/2012   Fibroid    Function kidney decreased    GERD (gastroesophageal reflux disease)    Hemiplegia, unspecified, affecting nondominant side 11/11/2012   Hepatitis    Hepatitis A -college yrs"water source exposure"   Hiatal hernia    History of shingles    2-3 yrs ago last out break "around waist"   History of stomach ulcers    Hyperlipidemia 07/30/2016   Hypertension    ICD (implantable cardiac defibrillator) in place    Iron deficiency anemia    Ischemic cardiomyopathy    status post biventricular ICD placed by DR Edumunds who used to see Dr Elsie Lincoln here to establish  cardiovascular care.   Migraines    MVP (mitral valve prolapse)    Antibiotics not required for procedures   Myocardial infarction (HCC)    "I've had 2; the others they were able to catch before completing" (02/14/2013)   Pacemaker    Paroxysmal SVT (supraventricular tachycardia) (HCC) 06/18/2011   Pneumonia 1950's & 1985   Shortness of breath    "lying down flat; at times w/exertion" (02/14/2013). 10-06-15 exertion only..   Stroke [facility])    "2 confirmed; 9  TIA's; results in dragging LLE; numbness in tip of tongue" (02/14/2013),10-06-15 right hand tends to be weaker when tired.   TIA (transient ischemic attack) 11/11/2012   Type II diabetes mellitus [facility])    Past Surgical History:  Procedure Laterality Date   ABDOMINAL HYSTERECTOMY  1985   TAH    BIV ICD GENERTAOR CHANGE OUT  06/2007; 04/2008   "2 lead initial placement, at [facility]; done at [facility], after developing CHF" (02/14/2013)   BIV PACEMAKER GENERATOR CHANGE OUT N/A 01/29/2015    Procedure: BIV PACEMAKER GENERATOR CHANGE OUT;  Surgeon: Marinus Maw, MD;  Location: [facility] CATH LAB;  Service: Cardiovascular;  Laterality: N/A;   BREAST EXCISIONAL BIOPSY Left 01/2007; 06/2007; 03/2008   "benign" (02/14/2013)   BREAST SURGERY     CARDIAC CATHETERIZATION     "probably in the teens" (02/14/2013)   CARDIAC DEFIBRILLATOR PLACEMENT     COLONOSCOPY  ~ 2002   COLONOSCOPY WITH PROPOFOL N/A 10/07/2015   Procedure: COLONOSCOPY WITH PROPOFOL;  Surgeon: Charna Elizabeth, MD;  Location: [facility] ENDOSCOPY;  Service: Endoscopy;  Laterality: N/A;   CORONARY ANGIOPLASTY WITH STENT PLACEMENT     "started out w/5; bypass corrected some; 1 stent since the bypass" (02/14/2013)   CORONARY ARTERY BYPASS GRAFT  ` 1998   LIMA-LAD, SVG-D1, SVG-PDA   DILATION AND CURETTAGE OF UTERUS  1975 X 2; 1976; 1977   INSERT / REPLACE / REMOVE PACEMAKER     biventricular defibrillator--06/10/ 2009   LEFT HEART CATH AND CORS/GRAFTS ANGIOGRAPHY N/A 08/04/2018   Procedure: LEFT HEART CATH AND CORS/GRAFTS ANGIOGRAPHY;  Surgeon: Kathleene Hazel, MD;  Location: [facility] INVASIVE CV LAB;  Service: Cardiovascular;  Laterality: N/A;   LEFT HEART CATHETERIZATION WITH CORONARY/GRAFT ANGIOGRAM N/A 01/03/2015   Procedure: LEFT HEART CATHETERIZATION WITH Isabel Caprice;  Surgeon: Lesleigh Noe, MD;  Location: [facility] CATH LAB;  Service: Cardiovascular;  Laterality: N/A;   SUPRAVENTRICULAR TACHYCARDIA ABLATION  06/2007   TEE WITHOUT CARDIOVERSION  11/14/2012   Procedure: TRANSESOPHAGEAL ECHOCARDIOGRAM (TEE);  Surgeon: Donato Schultz, MD;  Location: [facility] ENDOSCOPY;  Service: Cardiovascular;  Laterality: N/A;   Social History:  reports that she has never smoked. She has never used smokeless tobacco. She reports that she does not drink alcohol and does not use drugs.  Allergies  Allergen Reactions   Calcium Channel Blockers Other (See Comments)    Cannot take non-DHP CCBs (diltiazem, verapamil) due to CHF requiring ICD   Codeine Anaphylaxis  and Other (See Comments)   Lyrica [Pregabalin]     Nightmares, and swelling   Other Anaphylaxis and Other (See Comments)    Walnuts, pecans, and ANY melons PATIENT IS A VEGETARIAN    Ramipril Anaphylaxis, Swelling and Other (See Comments)   Ticlid [Ticlopidine Hcl] Other (See Comments)    Unprovoked bleeding while on Ticlid (doesn't think she was on ASA at the time) Takes Plavix without problems   Morphine And Codeine Other (See Comments)    Severe AMS, confusion, weakness Tolerates Fentanyl, Tramadol   Tape Dermatitis and Rash    Tolerates paper tape   Cucumber Extract Other (See Comments)   Digoxin Other (See Comments)   Digoxin And Related Nausea Only    Unknown   Diltiazem Hcl Other (See Comments)   Diltiazem Hcl Er Beads Other (See Comments)   Metformin Hcl Other (See Comments)   Metolazone Other (See Comments)    Cannot remember reaction   Milk-Related Compounds Other (See Comments)    Bad gas   Morphine  Sulfate Other (See Comments)   Myrbetriq [Mirabegron] Other (See Comments)    Aggravates migraines   Nsaids Other (See Comments)   Onglyza [Saxagliptin] Other (See Comments)    Exacerbates migraines   Penicillin V Other (See Comments)   Penicillins Hives    Has patient had a PCN reaction causing immediate rash, facial/tongue/throat swelling, SOB or lightheadedness with hypotension: Yes Has patient had a PCN reaction causing severe rash involving mucus membranes or skin necrosis: Unknown Has patient had a PCN reaction that required hospitalization: Unknown Has patient had a PCN reaction occurring within the last 10 years: No If all of the above answers are "NO", then may proceed with Cephalosporin use.    Spironolactone Other (See Comments)    Cannot remember reaction   Sulfa Antibiotics Hives and Other (See Comments)   Tetracycline Hcl Other (See Comments)   Tetracyclines & Related Other (See Comments)    Cannot remember reaction Tolerates macrolides  (azithromycin)   Ticlopidine Other (See Comments)   Verapamil Other (See Comments)    Pacemaker/CHF   Vicodin [Hydrocodone-Acetaminophen] Other (See Comments)    EXTREME LETHARGY   Latex Itching, Rash and Other (See Comments)    Family History  Problem Relation Age of Onset   Heart disease Mother    Hypertension Mother    Heart attack Mother    Stroke Mother    Heart disease Father    Hypertension Father    Diabetes Father    Kidney failure Father    Heart attack Father    Stroke Father    Heart disease Brother    Diabetes Brother    Kidney failure Brother    Heart attack Brother    Diabetes Paternal Grandmother     Prior to Admission medications   Medication Sig Start Date End Date Taking? Authorizing Provider  albuterol (PROVENTIL HFA;VENTOLIN HFA) 108 (90 BASE) MCG/ACT inhaler Inhale 2 puffs into the lungs every 4 (four) hours as needed for wheezing or shortness of breath.   Yes [provider]  ALPRAZolam Prudy Feeler) 1 MG tablet Take 1 mg by mouth 3 (three) times daily.  09/20/18  Yes [provider]  Artificial Tear GEL Place 2 drops into both eyes daily as needed (dry eyes).    Yes [provider]  aspirin 325 MG EC tablet Take 325 mg by mouth daily.   Yes [provider]  BIOTIN PO Take 1 tablet by mouth daily.   Yes [provider]  cetirizine (ZYRTEC) 10 MG tablet Take 10 mg by mouth daily.   Yes [provider]  Cholecalciferol (VITAMIN D-3) 1000 UNITS CAPS Take 1,000 Units by mouth daily.    Yes [provider]  clopidogrel (PLAVIX) 75 MG tablet TAKE 1 TABLET EVERY DAY 09/22/23  Yes Sheilah Pigeon, PA-C  doxazosin (CARDURA) 4 MG tablet Take 4 mg by mouth at bedtime.    Yes [provider]  EPINEPHrine 0.3 mg/0.3 mL IJ SOAJ injection Inject 0.3 mg into the muscle as needed for anaphylaxis.  08/08/18  Yes [provider]  ezetimibe (ZETIA) 10 MG tablet TAKE 1 TABLET EVERY DAY 09/22/23  Yes  Sheilah Pigeon, PA-C  Flaxseed, Linseed, (FLAXSEED OIL) 1200 MG CAPS Take 1,200 mg by mouth daily.   Yes [provider]  fluticasone (FLONASE) 50 MCG/ACT nasal spray Place 2 sprays into both nostrils daily as needed for allergies or rhinitis.   Yes [provider]  furosemide (LASIX) 80 MG tablet TAKE 1  TABLET TWICE A DAY. MAY TAKE AN ADDITIONAL 80MG  (1 TABLET) AS NEEDED FOR EDEMA Patient taking differently: Take 160 mg by mouth daily. TAKE 1 TABLET TWICE A DAY. MAY TAKE AN ADDITIONAL 80MG  (1 TABLET) AS NEEDED FOR EDEMA 09/22/23  Yes Sheilah Pigeon, PA-C  hydrALAZINE (APRESOLINE) 25 MG tablet TAKE 1 TABLET TWICE DAILY 09/22/23  Yes Sheilah Pigeon, PA-C  isosorbide mononitrate (IMDUR) 60 MG 24 hr tablet TAKE 1 TABLET EVERY DAY 09/22/23  Yes Sheilah Pigeon, PA-C  Ketotifen Fumarate (ALAWAY OP) Place 1 drop into both eyes daily as needed (allergies).    Yes [provider]  metoprolol succinate (TOPROL-XL) 100 MG 24 hr tablet TAKE 2 TABLETS EVERY DAY WITH OR IMMEDIATELY FOLLOWING A MEAL 09/22/23  Yes Sheilah Pigeon, PA-C  Multiple Vitamin (MULTIVITAMIN WITH MINERALS) TABS Take 1 tablet by mouth daily.   Yes [provider]  nitroGLYCERIN (NITROSTAT) 0.4 MG SL tablet Place 1 tablet under the tongue every 5 minutes as needed for chest pain, max 3 doses, go to er if no relief 01/29/22  Yes Lyn Records, MD  olmesartan (BENICAR) 40 MG tablet Take 1 tablet (40 mg total) by mouth daily. 10/10/23  Yes Runell Gess, MD  OZEMPIC, 0.25 OR 0.5 MG/DOSE, 2 MG/1.5ML SOPN Inject 0.25 mg into the skin once a week. Sunday 08/30/19  Yes [provider]  pantoprazole (PROTONIX) 40 MG tablet TAKE 1 TABLET BY MOUTH  DAILY 09/02/21  Yes Lyn Records, MD  potassium chloride (KLOR-CON M) 10 MEQ tablet TAKE 2 TABLETS TWICE A DAY (KEEP MD APPOINTMENT FOR REFILLS) Patient taking differently: Take 20 mEq by mouth daily. TAKE 2 TABLETS TWICE A DAY (KEEP MD  APPOINTMENT FOR REFILLS) 06/06/23  Yes Runell Gess, MD  simvastatin (ZOCOR) 40 MG tablet Take 1 tablet (40 mg total) by mouth at bedtime. Patient taking differently: Take 40 mg by mouth daily. 10/10/23  Yes Runell Gess, MD  sodium chloride (MURO 128) 5 % ophthalmic ointment Place 1 drop into both eyes at bedtime as needed for eye irritation. For dry eyes   Yes [provider]  traMADol (ULTRAM) 50 MG tablet Take 100 mg by mouth every 6 (six) hours as needed (MIGRAINES).   Yes [provider]  venlafaxine (EFFEXOR) 100 MG tablet Take 100-200 mg by mouth See admin instructions. TAKE 200 mg by mouth in the morning and take 100 mg at noon 04/12/11  Yes [provider]  vitamin A 7500 UNIT capsule Take 2,400 Units by mouth daily.   Yes [provider]  Alcohol Swabs (B-D SINGLE USE SWABS REGULAR) PADS  03/05/19   [provider]  DROPLET PEN NEEDLES 32G X 4 MM MISC  03/05/19   [provider]  LANTUS SOLOSTAR 100 UNIT/ML Solostar Pen Inject 6 Units into the skin at bedtime. Patient not taking: Reported on 10/19/2023 04/10/19   [provider]  TRUE METRIX BLOOD GLUCOSE TEST test strip  02/27/19   [provider]  TRUEplus Lancets 33G MISC  02/27/19   [provider]    Physical Exam: Vitals:   10/19/23 2327 10/19/23 2330 10/20/23 0000 10/20/23 0021  BP:  (!) 129/91 (!) 140/97   Pulse:  97 87   Resp:  20 15   Temp: 98.1 F (36.7 C)     TempSrc: Oral     SpO2:  99% 99%   Weight:    51.3 kg  Height:  5\' 3"  (1.6 m)   Constitutional: NAD, calm, comfortable, non-toxic well-appearing thin female appearing younger than stated age lying upright in bed Eyes: lids and conjunctivae normal ENMT: Mucous membranes are moist.  Neck: normal, supple Respiratory: clear to auscultation bilaterally, no wheezing, no crackles. Normal respiratory effort. No accessory muscle use.  On room air. Cardiovascular: Regular rate and  rhythm, no murmurs / rubs / gallops. No extremity edema.  Defibrillator in place to left chest wall. Abdomen: Soft, nontender nondistended.  Musculoskeletal: no clubbing / cyanosis. No joint deformity upper and lower extremities. Normal muscle tone.  Skin: no rashes, lesions, ulcers. No induration Neurologic: CN 2-12 grossly intact. Sensation intact, resting tremor worse on the right hand.   Psychiatric: Normal judgment and insight. Alert and oriented x 3. Normal mood.   Data Reviewed:  See HPI  Assessment and Plan: * Palpitation -presented with symptoms of palpitation with tachycardia, dyspnea and tremor -Questionable infection, arrhythmia versus pulmonary embolism -TSH wnl -Currently afebrile, no leukocytosis but symptoms still concerning for possible infection.  Will obtain COVID/flu/RSV.  Obtain blood culture. -Has remote history of SVT and currently has defibrillator.  Pending results of interrogation.  Continue home metoprolol. -Low suspicion of embolism but given tachycardia and unknown cause will obtain V/Q scan  -keep on continuous telemetry  Addendum: pt 's interrogation report demonstrated 2 episodes of VT with longest lasting close to 11 mins 12/11 - 12:39 - 10:52 duration ; 13:42: duration 2:26. Avg from 140-170bpm  -will need EP services in the morning  ESRD (end stage renal disease) (HCC) -Cr is stable. Pt currently on [facility] kidney transplant list.   Hyperlipidemia -continue Zetia  Chronic diastolic heart failure (HCC) -last echocardiogram with EF of 45 to 50% and grade 1 diastolic dysfunction, mild mitral valve regurgitation on 09/2022. -Will repeat echocardiogram given dyspnea and history of CABG and bypass stent although troponins are reassuring and flat  DM (diabetes mellitus) (HCC) Controlled.  Last hemoglobin A1c of 5.8 on 09/2023 -Has been controlled on Ozempic and diet  HTN (hypertension) - Had borderline blood pressure with SBP in the 100s in the field  and fluid responsive here. Likely dehydrated as she is on 160mg  of Lasix.  -will resume hydralazine, Imdur, ACE-I, doxazosin, Lasix tomorrow -continue beta-blocker -keep on gentle IV fluid hydration overnight  Anxiety -continue Xanax and Effexor  Coronary artery disease involving coronary bypass graft of native heart with angina pectoris (HCC) -continue Plavix, Zetia and Imdur      Advance Care Planning: Full  Consults: none  Family Communication: 3 female family members at bedside  Severity of Illness: The appropriate patient status for this patient is OBSERVATION. Observation status is judged to be reasonable and necessary in order to provide the required intensity of service to ensure the patient's safety. The patient's presenting symptoms, physical exam findings, and initial radiographic and laboratory data in the context of their medical condition is felt to place them at decreased risk for further clinical deterioration. Furthermore, it is anticipated that the patient will be medically stable for discharge from the hospital within 2 midnights of admission.   Author: Anselm Jungling, DO 10/20/2023 2:35 AM  For on call review [email or web address].

## 2023-10-19 NOTE — ED Notes (Signed)
Called lab and added on new labs.

## 2023-10-19 NOTE — ED Notes (Signed)
Pt requested orange juice. Dr. Earlene Plater messaged via secure chat.

## 2023-10-19 NOTE — Assessment & Plan Note (Addendum)
-  presented with symptoms of palpitation with tachycardia, dyspnea and tremor -Questionable infection, arrhythmia versus pulmonary embolism -TSH wnl -Currently afebrile, no leukocytosis but symptoms still concerning for possible infection.  Will obtain COVID/flu/RSV.  Obtain blood culture. -Has remote history of SVT and currently has defibrillator.  Pending results of interrogation.  Continue home metoprolol. -Low suspicion of embolism but given tachycardia and unknown cause will obtain V/Q scan  -keep on continuous telemetry  Addendum: pt 's interrogation report demonstrated 2 episodes of VT with longest lasting close to 11 mins 12/11 - 12:39 - 10:52 duration ; 13:42: duration 2:26. Avg from 140-170bpm  -will need EP services in the morning

## 2023-10-19 NOTE — Assessment & Plan Note (Signed)
-  Cr is stable. Pt currently on [facility] kidney transplant list.

## 2023-10-19 NOTE — Assessment & Plan Note (Signed)
-  last echocardiogram with EF of 45 to 50% and grade 1 diastolic dysfunction, mild mitral valve regurgitation on 09/2022. -Will repeat echocardiogram given dyspnea and history of CABG and bypass stent although troponins are reassuring and flat

## 2023-10-19 NOTE — Assessment & Plan Note (Signed)
-  continue Xanax and Effexor

## 2023-10-20 ENCOUNTER — Other Ambulatory Visit: Payer: Self-pay

## 2023-10-20 ENCOUNTER — Observation Stay (HOSPITAL_COMMUNITY): Payer: Medicare HMO

## 2023-10-20 ENCOUNTER — Encounter (HOSPITAL_COMMUNITY): Payer: Self-pay | Admitting: Family Medicine

## 2023-10-20 ENCOUNTER — Observation Stay (HOSPITAL_BASED_OUTPATIENT_CLINIC_OR_DEPARTMENT_OTHER): Payer: Medicare HMO

## 2023-10-20 DIAGNOSIS — R0609 Other forms of dyspnea: Secondary | ICD-10-CM | POA: Diagnosis not present

## 2023-10-20 DIAGNOSIS — I471 Supraventricular tachycardia, unspecified: Secondary | ICD-10-CM | POA: Diagnosis not present

## 2023-10-20 DIAGNOSIS — R002 Palpitations: Secondary | ICD-10-CM | POA: Diagnosis not present

## 2023-10-20 DIAGNOSIS — Z950 Presence of cardiac pacemaker: Secondary | ICD-10-CM | POA: Diagnosis not present

## 2023-10-20 LAB — BASIC METABOLIC PANEL
Anion gap: 11 (ref 5–15)
BUN: 37 mg/dL — ABNORMAL HIGH (ref 8–23)
CO2: 26 mmol/L (ref 22–32)
Calcium: 9 mg/dL (ref 8.9–10.3)
Chloride: 101 mmol/L (ref 98–111)
Creatinine, Ser: 2.79 mg/dL — ABNORMAL HIGH (ref 0.44–1.00)
GFR, Estimated: 18 mL/min — ABNORMAL LOW (ref >60)
Glucose, Bld: 105 mg/dL — ABNORMAL HIGH (ref 70–99)
Potassium: 3.5 mmol/L (ref 3.5–5.1)
Sodium: 138 mmol/L (ref 135–145)

## 2023-10-20 LAB — RESP PANEL BY RT-PCR (RSV, FLU A&B, COVID)  RVPGX2
Influenza A by PCR: NEGATIVE
Influenza B by PCR: NEGATIVE
Resp Syncytial Virus by PCR: NEGATIVE
SARS Coronavirus 2 by RT PCR: NEGATIVE

## 2023-10-20 LAB — ECHOCARDIOGRAM COMPLETE
Area-P 1/2: 4.01 cm2
Calc EF: 46.3 %
Height: 63 in
S' Lateral: 2.8 cm
Single Plane A2C EF: 42.6 %
Single Plane A4C EF: 50 %
Weight: 1808 [oz_av]

## 2023-10-20 LAB — CBC
HCT: 34.1 % — ABNORMAL LOW (ref 36.0–46.0)
Hemoglobin: 11.9 g/dL — ABNORMAL LOW (ref 12.0–15.0)
MCH: 30 pg (ref 26.0–34.0)
MCHC: 34.9 g/dL (ref 30.0–36.0)
MCV: 85.9 fL (ref 80.0–100.0)
Platelets: 167 10*3/uL (ref 150–400)
RBC: 3.97 MIL/uL (ref 3.87–5.11)
RDW: 12.8 % (ref 11.5–15.5)
WBC: 6.5 10*3/uL (ref 4.0–10.5)
nRBC: 0 % (ref 0.0–0.2)

## 2023-10-20 MED ORDER — TECHNETIUM TO 99M ALBUMIN AGGREGATED
4.1000 | Freq: Once | INTRAVENOUS | Status: AC | PRN
  Administered 2023-10-20: 4.1 via INTRAVENOUS

## 2023-10-20 NOTE — Plan of Care (Signed)
Pt is aox4, vss, plan to medically manage symptoms of palpations and SOB

## 2023-10-20 NOTE — Progress Notes (Signed)
PROGRESS NOTE    [name]  UVO:[id number] DOB: 06/08/1951 DOA: 10/19/2023 PCP: Merri Brunette, MD  Chief Complaint  Patient presents with   Palpitations    Brief Narrative:   [name] is [name] 72 y.o. female with medical history significant of ESRD, T2DM, HTN, CAD s/p CABG with subsequent stent, HFpEF s/p ICD, CVA, depression and anxiety who presents with palpitations, dyspnea and dizziness.   Assessment & Plan:   Principal Problem:   Palpitation Active Problems:   Coronary artery disease involving coronary bypass graft of native heart with angina pectoris (HCC)   Anxiety   HTN (hypertension)   DM (diabetes mellitus) (HCC)   Chronic diastolic heart failure (HCC)   Hyperlipidemia   ESRD (end stage renal disease) (HCC)  Palpitations -presented with symptoms of palpitation with tachycardia, dyspnea and tremor -negative VQ scan  -TSH wnl -Has remote history of SVT and currently has defibrillator. pt 's interrogation report demonstrated 2 episodes of VT with longest lasting close to 11 mins.  Continue home metoprolol. -appreciate EP recs   ESRD (end stage renal disease) (HCC) -Cr is stable. Pt currently on [facility] kidney transplant list.    Hyperlipidemia -continue Zetia   Chronic diastolic heart failure (HCC) -last echocardiogram with EF of 45 to 50% and grade 1 diastolic dysfunction, mild mitral valve regurgitation on 09/2022. -repeat echo 12/12 with EF 45-50%, no significant change   DM (diabetes mellitus) (HCC) Controlled.  Last hemoglobin A1c of 5.8 on 09/2023 -Has been controlled on Ozempic and diet   HTN (hypertension) - lasix, hydralazine, avapro, imdur, metoprolol, doxazosin -continue beta-blocker   Anxiety -continue Xanax and Effexor   Coronary artery disease involving coronary bypass graft of native heart with angina pectoris (HCC) -continue Plavix, Zetia and Imdur     DVT prophylaxis: lovenox Code Status: full Family Communication:  SIL at bedside Disposition:   Status is: Observation The patient remains OBS appropriate and will d/c before 2 midnights.   Consultants:  cardiology  Procedures:  Echo IMPRESSIONS     1. Left ventricular ejection fraction, by estimation, is 45 to 50%. The  left ventricle has mildly decreased function. The left ventricle  demonstrates global hypokinesis. Left ventricular diastolic parameters are  indeterminate.   2. Right ventricular systolic function is low normal. The right  ventricular size is normal. Tricuspid regurgitation signal is inadequate  for assessing PA pressure.   3. Right atrial size was mild to moderately dilated.   4. The mitral valve is grossly normal. Mild mitral valve regurgitation.  No evidence of mitral stenosis.   5. The aortic valve is tricuspid. There is mild calcification of the  aortic valve. Aortic valve regurgitation is not visualized. No aortic  stenosis is present.   6. The pulmonic valve was abnormal.   7. The inferior vena cava is normal in size with greater than 50%  respiratory variability, suggesting right atrial pressure of 3 mmHg.   Comparison(s): No significant change from prior study.   Antimicrobials:  Anti-infectives (From admission, onward)    None       Subjective: She remembers me taking care of her husband 2 years ago No new complaints at the time of my evaluation - we discussed EP's plan SIL at bedside  Objective: Vitals:   10/20/23 1059 10/20/23 1100 10/20/23 1400 10/20/23 1421  BP: 132/77 134/86 106/81 114/78  Pulse: 85 90 92 86  Resp:  18 18 16   Temp:  98.6 F (37 C)  97.9 F (  36.6 C)  TempSrc:  Oral  Oral  SpO2:  100% 99%   Weight:    51.2 kg  Height:        Intake/Output Summary (Last 24 hours) at 10/20/2023 1922 Last data filed at 10/20/2023 1336 Gross per 24 hour  Intake 944.51 ml  Output --  Net 944.51 ml   Filed Weights   10/20/23 0021 10/20/23 1421  Weight: 51.3 kg 51.2 kg     Examination:  General exam: Appears calm and comfortable  Respiratory system: Clear to auscultation. Respiratory effort normal. Cardiovascular system: S1 & S2 heard, RRR.  Gastrointestinal system: Abdomen is nondistended, soft and nontender.  Central nervous system: Alert and oriented. No focal neurological deficits. Extremities: no LEE   Data Reviewed: I have personally reviewed following labs and imaging studies  CBC: Recent Labs  Lab 10/19/23 1522 10/20/23 0522  WBC 6.4 6.5  HGB 12.5 11.9*  HCT 37.0 34.1*  MCV 86.7 85.9  PLT 202 167    Basic Metabolic Panel: Recent Labs  Lab 10/19/23 1522 10/19/23 1644 10/20/23 0522  NA 140  --  138  K 4.1  --  3.5  CL 99  --  101  CO2 30  --  26  GLUCOSE 84  --  105*  BUN 38*  --  37*  CREATININE 2.58*  --  2.79*  CALCIUM 9.5  --  9.0  MG  --  2.3  --     GFR: Estimated Creatinine Clearance: 15.2 mL/min (Kimarie Coor) (by C-G formula based on SCr of 2.79 mg/dL (H)).  Liver Function Tests: Recent Labs  Lab 10/19/23 1644  AST 28  ALT 23  ALKPHOS 42  BILITOT 0.7  PROT 6.6  ALBUMIN 3.7    CBG: Recent Labs  Lab 10/19/23 1632 10/19/23 1829  GLUCAP 90 81     Recent Results (from the past 240 hours)  Culture, blood (Routine X 2) w Reflex to ID Panel     Status: None (Preliminary result)   Collection Time: 10/19/23 10:45 PM   Specimen: BLOOD LEFT FOREARM  Result Value Ref Range Status   Specimen Description BLOOD LEFT FOREARM  Final   Special Requests   Final    BOTTLES DRAWN AEROBIC AND ANAEROBIC Blood Culture results may not be optimal due to an inadequate volume of blood received in culture bottles   Culture   Final    NO GROWTH < 12 HOURS Performed at [facility], [address] [place], [place] [address]    Report Status PENDING  Incomplete  Resp panel by RT-PCR (RSV, Flu Jannat Rosemeyer&B, Covid) Peripheral     Status: None   Collection Time: 10/19/23 10:48 PM   Specimen: Peripheral; Nasal Swab  Result Value Ref  Range Status   SARS Coronavirus 2 by RT PCR NEGATIVE NEGATIVE Final   Influenza Estalee Mccandlish by PCR NEGATIVE NEGATIVE Final   Influenza B by PCR NEGATIVE NEGATIVE Final    Comment: (NOTE) The Xpert Xpress SARS-CoV-2/FLU/RSV plus assay is intended as an aid in the diagnosis of influenza from Nasopharyngeal swab specimens and should not be used as Trenee Igoe sole basis for treatment. Nasal washings and aspirates are unacceptable for Xpert Xpress SARS-CoV-2/FLU/RSV testing.  Fact Sheet for Patients: BloggerCourse.com  Fact Sheet for Healthcare Providers: SeriousBroker.it  This test is not yet approved or cleared by the [place] FDA and has been authorized for detection and/or diagnosis of SARS-CoV-2 by FDA under an Emergency Use Authorization (EUA). This EUA will  remain in effect (meaning this test can be used) for the duration of the COVID-19 declaration under Section 564(b)(1) of the Act, 21 U.S.C. section 360bbb-3(b)(1), unless the authorization is terminated or revoked.     Resp Syncytial Virus by PCR NEGATIVE NEGATIVE Final    Comment: (NOTE) Fact Sheet for Patients: BloggerCourse.com  Fact Sheet for Healthcare Providers: SeriousBroker.it  This test is not yet approved or cleared by the [place] FDA and has been authorized for detection and/or diagnosis of SARS-CoV-2 by FDA under an Emergency Use Authorization (EUA). This EUA will remain in effect (meaning this test can be used) for the duration of the COVID-19 declaration under Section 564(b)(1) of the Act, 21 U.S.C. section 360bbb-3(b)(1), unless the authorization is terminated or revoked.  Performed at [facility], [address] [place], [place] [address]   Culture, blood (Routine X 2) w Reflex to ID Panel     Status: None (Preliminary result)   Collection Time: 10/19/23 11:23 PM   Specimen: BLOOD  Result Value Ref  Range Status   Specimen Description BLOOD RIGHT ANTECUBITAL  Final   Special Requests   Final    BOTTLES DRAWN AEROBIC AND ANAEROBIC Blood Culture results may not be optimal due to an excessive volume of blood received in culture bottles   Culture   Final    NO GROWTH < 12 HOURS Performed at [facility], [address] [place], [place] [address]    Report Status PENDING  Incomplete         Radiology Studies: ECHOCARDIOGRAM COMPLETE Result Date: 10/20/2023    ECHOCARDIOGRAM REPORT   Patient Name:   SABEL BRAUND Date of Exam: 10/20/2023 Medical Rec #:  [id number]           Height:       63.0 in Accession #:    [id number]          Weight:       113.0 lb Date of Birth:  [birth year]           BSA:          1.517 m Patient Age:    72 years            BP:           129/89 mmHg Patient Gender: F                   HR:           93 bpm. Exam Location:  Inpatient Procedure: 2D Echo, Cardiac Doppler and Color Doppler Indications:    dyspnea  History:        Patient has prior history of Echocardiogram examinations, most                 recent 09/29/2022. Cardiomyopathy, Pacemaker and Defibrillator,                 Stroke; Risk Factors:Hypertension, Diabetes and Dyslipidemia.  Sonographer:    Melissa Morford RDCS (AE, PE) Referring Phys: [id number] CHING T TU IMPRESSIONS  1. Left ventricular ejection fraction, by estimation, is 45 to 50%. The left ventricle has mildly decreased function. The left ventricle demonstrates global hypokinesis. Left ventricular diastolic parameters are indeterminate.  2. Right ventricular systolic function is low normal. The right ventricular size is normal. Tricuspid regurgitation signal is inadequate for assessing PA pressure.  3. Right atrial size was mild to moderately dilated.  4. The mitral valve is grossly normal.  Mild mitral valve regurgitation. No evidence of mitral stenosis.  5. The aortic valve is tricuspid. There is mild calcification of the aortic valve. Aortic  valve regurgitation is not visualized. No aortic stenosis is present.  6. The pulmonic valve was abnormal.  7. The inferior vena cava is normal in size with greater than 50% respiratory variability, suggesting right atrial pressure of 3 mmHg. Comparison(s): No significant change from prior study. FINDINGS  Left Ventricle: Left ventricular ejection fraction, by estimation, is 45 to 50%. The left ventricle has mildly decreased function. The left ventricle demonstrates global hypokinesis. The left ventricular internal cavity size was normal in size. There is  no left ventricular hypertrophy. Left ventricular diastolic parameters are indeterminate. Right Ventricle: The right ventricular size is normal. No increase in right ventricular wall thickness. Right ventricular systolic function is low normal. Tricuspid regurgitation signal is inadequate for assessing PA pressure. Left Atrium: Left atrial size was normal in size. Right Atrium: Right atrial size was mild to moderately dilated. Pericardium: There is no evidence of pericardial effusion. Mitral Valve: The mitral valve is grossly normal. There is mild thickening of the mitral valve leaflet(s). There is mild calcification of the mitral valve leaflet(s). Mild mitral valve regurgitation. No evidence of mitral valve stenosis. Tricuspid Valve: The tricuspid valve is normal in structure. Tricuspid valve regurgitation is trivial. No evidence of tricuspid stenosis. Aortic Valve: The aortic valve is tricuspid. There is mild calcification of the aortic valve. Aortic valve regurgitation is not visualized. No aortic stenosis is present. Pulmonic Valve: The pulmonic valve was abnormal. Pulmonic valve regurgitation is not visualized. No evidence of pulmonic stenosis. Aorta: The aortic root, ascending aorta and aortic arch are all structurally normal, with no evidence of dilitation or obstruction. Venous: The inferior vena cava is normal in size with greater than 50% respiratory  variability, suggesting right atrial pressure of 3 mmHg. IAS/Shunts: The atrial septum is grossly normal. Additional Comments: Amira Podolak device lead is visualized.  LEFT VENTRICLE PLAX 2D LVIDd:         3.50 cm     Diastology LVIDs:         2.80 cm     LV e' medial:    4.68 cm/s LV PW:         0.90 cm     LV E/e' medial:  8.9 LV IVS:        0.80 cm     LV e' lateral:   3.92 cm/s LVOT diam:     2.20 cm     LV E/e' lateral: 10.6 LV SV:         51 LV SV Index:   34 LVOT Area:     3.80 cm                             3D Volume EF: LV Volumes (MOD)           3D EF:        43 % LV vol d, MOD A2C: 43.4 ml LV EDV:       127 ml LV vol d, MOD A4C: 65.8 ml LV ESV:       73 ml LV vol s, MOD A2C: 24.9 ml LV SV:        54 ml LV vol s, MOD A4C: 32.9 ml LV SV MOD A2C:     18.5 ml LV SV MOD A4C:     65.8 ml LV  SV MOD BP:      25.2 ml RIGHT VENTRICLE            IVC RV S prime:     7.29 cm/s  IVC diam: 0.90 cm TAPSE (M-mode): 2.1 cm LEFT ATRIUM             Index        RIGHT ATRIUM           Index LA diam:        3.10 cm 2.04 cm/m   RA Area:     15.50 cm LA Vol (A2C):   14.8 ml 9.76 ml/m   RA Volume:   39.40 ml  25.97 ml/m LA Vol (A4C):   24.0 ml 15.82 ml/m LA Biplane Vol: 20.2 ml 13.32 ml/m  AORTIC VALVE LVOT Vmax:   71.10 cm/s LVOT Vmean:  56.500 cm/s LVOT VTI:    0.134 m  AORTA Ao Root diam: 2.90 cm Ao Asc diam:  3.70 cm MITRAL VALVE MV Area (PHT): 4.01 cm    SHUNTS MV Decel Time: 189 msec    Systemic VTI:  0.13 m MV E velocity: 41.60 cm/s  Systemic Diam: 2.20 cm MV Jearline Hirschhorn velocity: 76.30 cm/s MV E/Sabina Beavers ratio:  0.55 Jodelle Red MD Electronically signed by Jodelle Red MD Signature Date/Time: 10/20/2023/2:05:47 PM    Final    NM Pulmonary Perfusion Result Date: 10/20/2023 CLINICAL DATA:  Negative D-dimer. Concern for pulmonary embolism. Load intermediate probability. EXAM: NUCLEAR MEDICINE PERFUSION LUNG SCAN TECHNIQUE: Perfusion images were obtained in multiple projections after intravenous injection of  radiopharmaceutical. RADIOPHARMACEUTICALS:  4.1 mCi Tc-15m MAA COMPARISON:  None Available. FINDINGS: No wedge-shaped peripheral perfusion defect within LEFT or RIGHT lung to suggest acute pulmonary embolism. Normal perfusion pattern. Photopenia in the LEFT upper lobe related to cardiac pacer maker IMPRESSION: Normal pulmonary perfusion exam. No evidence acute pulmonary disease. Electronically Signed   By: Genevive Bi M.D.   On: 10/20/2023 13:54   DG Chest 2 View Result Date: 10/19/2023 CLINICAL DATA:  Shortness of breath. EXAM: CHEST - 2 VIEW COMPARISON:  Apr 06, 2022. FINDINGS: The heart size and mediastinal contours are within normal limits. Stable left-sided defibrillator. Status post coronary artery bypass graft. Both lungs are clear. The visualized skeletal structures are unremarkable. IMPRESSION: No active cardiopulmonary disease. Electronically Signed   By: Lupita Raider M.D.   On: 10/19/2023 16:46        Scheduled Meds:  ALPRAZolam  1 mg Oral TID   aspirin EC  325 mg Oral Daily   clopidogrel  75 mg Oral Daily   doxazosin  4 mg Oral QHS   enoxaparin (LOVENOX) injection  30 mg Subcutaneous Daily   ezetimibe  10 mg Oral Daily   furosemide  160 mg Oral Daily   hydrALAZINE  25 mg Oral BID   irbesartan  300 mg Oral Daily   isosorbide mononitrate  60 mg Oral Daily   loratadine  10 mg Oral Daily   metoprolol succinate  200 mg Oral Daily   pantoprazole  40 mg Oral Daily   potassium chloride  20 mEq Oral Daily   simvastatin  40 mg Oral Daily   venlafaxine  200 mg Oral Q breakfast   And   venlafaxine  100 mg Oral Q1200   Continuous Infusions:   LOS: 0 days    Time spent: over 30 min    Lacretia Nicks, MD [facility]   To contact the attending provider between 7A-7P or  the covering provider during after hours 7P-7A, please log into the web site [email or web address] and access using universal [place] password for that web site. If you do not have the password,  please call the hospital operator.  10/20/2023, 7:22 PM

## 2023-10-20 NOTE — Consult Note (Signed)
Cardiology Consultation   Patient ID: [name] MRN: [id number]; DOB: May 10, 1951  Admit date: 10/19/2023 Date of Consult: 10/20/2023  PCP:  Merri Brunette, MD   [facility] Providers Cardiologist:  Lesleigh Noe, MD (Inactive) >> Dr. Allyson Sabal EP: Dr. Ladona Ridgel    Patient Profile:   [name] is a 72 y.o. female with a hx of anxiety, asthma, CAD (CABG 1998), ICM, chronic CHF (systolic), ICD, HTN, HLD, stroke, DM, VHD (mod MR), CKD (IV, being evaluated for kidney transplant), remote hx of SVT (ablated)  who is being seen 10/20/2023 for the evaluation of palpitations at the request of Dr. Lowell Guitar.  History of Present Illness:   [name] reports being in her USOH, until Monday, seemed to feel a biit tremulous, nervous perhaps, shakey to the point her teeth would chatter,  with intermittent palpitations (these two things though are not connected to each other), palpitations are fast, she was very aware of them, had an episode Monday and Tuesday, a few minute, no clear trigger No CP, pressor, perhaps making her feel breathless. No near syncope or syncope.  Yesterday when visiting with a friend who recently had a heart event, her friend's home health RN checked her pulse with her mention of having palpitations, reported 150+ and advised  e go to the ER so she did.  By the time here had resolved  She has not been ill, no fever, illnesses, she reports excellent medication compliance. There was a death in the family a few months back but otherwise no real acute trigger. The palpitations were a bit similar to her prior SVT, not exactly the same  LABS K+ 4.1 > 3.5 Mag 2.3 BUN/Creat 38/2.58 > 2.79 HS Trop 44 > 38 WBC 6.5 H/H 11.9/34 Plts 167 TSH 1.731  ICD care link transmission reviewed Battery est to ERI is 2 mo Lead measurements are good 2 monitored VT episodes In review have a 1:1 correlation though V seems to come 1st initially though nearly  simultaneously then transitions to A 1st again nearly simultaneously No treated episodes Rates 150's  All noted yesterday (none Monday Tuesday logged by the device)_  One V sensed episode yesterday lasting  1hour 36 minutes  Here she feels well though still a but tremulous, her daughter bedside mentions in the ER she felt shaky and her HR was normal  Device information MDT CRT-D implanted 06/28/2007, gen change 01/29/2015  Past Medical History:  Diagnosis Date   AICD (automatic cardioverter/defibrillator) present    [employer]- Dr. Rosette Reveal follows   Anginal pain (HCC)    Anxiety    Asthma    Back pain    "pinched nerve-lower back" - Dr. Ethelene Hal follows.   Biventricular implantable cardioverter-defibrillator in situ 06/18/2011   Cerebral infarction (HCC) 11/11/2012   Cervical dysplasia    Chronic diastolic heart failure (HCC) 03/22/2016   Chronic kidney disease    Dr. Allena Katz follows.   Chronic renal insufficiency, stage III (moderate) (HCC) 11/11/2012   Complication of anesthesia    "I wake up during surgeries" (02/14/2013)   Coronary artery disease involving coronary bypass graft of native heart with angina pectoris (HCC) 06/18/2011   Depression    DM (diabetes mellitus) (HCC) 11/11/2012   Fibroid    Function kidney decreased    GERD (gastroesophageal reflux disease)    Hemiplegia, unspecified, affecting nondominant side 11/11/2012   Hepatitis    Hepatitis A -college yrs"water source exposure"   Hiatal hernia  History of shingles    2-3 yrs ago last out break "around waist"   History of stomach ulcers    Hyperlipidemia 07/30/2016   Hypertension    ICD (implantable cardiac defibrillator) in place    Iron deficiency anemia    Ischemic cardiomyopathy    status post biventricular ICD placed by DR Edumunds who used to see Dr Elsie Lincoln here to establish  cardiovascular care.   Migraines    MVP (mitral valve prolapse)    Antibiotics not required for procedures   Myocardial infarction (HCC)     "I've had 2; the others they were able to catch before completing" (02/14/2013)   Pacemaker    Paroxysmal SVT (supraventricular tachycardia) (HCC) 06/18/2011   Pneumonia 1950's & 1985   Shortness of breath    "lying down flat; at times w/exertion" (02/14/2013). 10-06-15 exertion only..   Stroke [facility])    "2 confirmed; 9 TIA's; results in dragging LLE; numbness in tip of tongue" (02/14/2013),10-06-15 right hand tends to be weaker when tired.   TIA (transient ischemic attack) 11/11/2012   Type II diabetes mellitus [facility])     Past Surgical History:  Procedure Laterality Date   ABDOMINAL HYSTERECTOMY  1985   TAH    BIV ICD GENERTAOR CHANGE OUT  06/2007; 04/2008   "2 lead initial placement, at [facility]; done at [facility], after developing CHF" (02/14/2013)   BIV PACEMAKER GENERATOR CHANGE OUT N/A 01/29/2015   Procedure: BIV PACEMAKER GENERATOR CHANGE OUT;  Surgeon: Marinus Maw, MD;  Location[facility] CATH LAB;  Service: Cardiovascular;  Laterality: N/A;   BREAST EXCISIONAL BIOPSY Left 01/2007; 06/2007; 03/2008   "benign" (02/14/2013)   BREAST SURGERY     CARDIAC CATHETERIZATION     "probably in the teens" (02/14/2013)   CARDIAC DEFIBRILLATOR PLACEMENT     COLONOSCOPY  ~ 2002   COLONOSCOPY WITH PROPOFOL N/A 10/07/2015   Procedure: COLONOSCOPY WITH PROPOFOL;  Surgeon: Charna Elizabeth, MD;  Location: [facility] ENDOSCOPY;  Service: Endoscopy;  Laterality: N/A;   CORONARY ANGIOPLASTY WITH STENT PLACEMENT     "started out w/5; bypass corrected some; 1 stent since the bypass" (02/14/2013)   CORONARY ARTERY BYPASS GRAFT  ` 1998   LIMA-LAD, SVG-D1, SVG-PDA   DILATION AND CURETTAGE OF UTERUS  1975 X 2; 1976; 1977   INSERT / REPLACE / REMOVE PACEMAKER     biventricular defibrillator--06/10/ 2009   LEFT HEART CATH AND CORS/GRAFTS ANGIOGRAPHY N/A 08/04/2018   Procedure: LEFT HEART CATH AND CORS/GRAFTS ANGIOGRAPHY;  Surgeon: Kathleene Hazel, MD;  Location: [facility] INVASIVE CV LAB;  Service: Cardiovascular;  Laterality: N/A;   LEFT HEART  CATHETERIZATION WITH CORONARY/GRAFT ANGIOGRAM N/A 01/03/2015   Procedure: LEFT HEART CATHETERIZATION WITH Isabel Caprice;  Surgeon: Lesleigh Noe, MD;  Location: [facility] CATH LAB;  Service: Cardiovascular;  Laterality: N/A;   SUPRAVENTRICULAR TACHYCARDIA ABLATION  06/2007   TEE WITHOUT CARDIOVERSION  11/14/2012   Procedure: TRANSESOPHAGEAL ECHOCARDIOGRAM (TEE);  Surgeon: Donato Schultz, MD;  Location: [facility] ENDOSCOPY;  Service: Cardiovascular;  Laterality: N/A;     Home Medications:  Prior to Admission medications   Medication Sig Start Date End Date Taking? Authorizing Provider  albuterol (PROVENTIL HFA;VENTOLIN HFA) 108 (90 BASE) MCG/ACT inhaler Inhale 2 puffs into the lungs every 4 (four) hours as needed for wheezing or shortness of breath.   Yes [provider]  ALPRAZolam Prudy Feeler) 1 MG tablet Take 1 mg by mouth 3 (three) times daily.  09/20/18  Yes [provider]  Artificial Tear GEL  Place 2 drops into both eyes daily as needed (dry eyes).    Yes [provider]  aspirin 325 MG EC tablet Take 325 mg by mouth daily.   Yes [provider]  BIOTIN PO Take 1 tablet by mouth daily.   Yes [provider]  cetirizine (ZYRTEC) 10 MG tablet Take 10 mg by mouth daily.   Yes [provider]  Cholecalciferol (VITAMIN D-3) 1000 UNITS CAPS Take 1,000 Units by mouth daily.    Yes [provider]  clopidogrel (PLAVIX) 75 MG tablet TAKE 1 TABLET EVERY DAY 09/22/23  Yes Sheilah Pigeon, PA-C  doxazosin (CARDURA) 4 MG tablet Take 4 mg by mouth at bedtime.    Yes [provider]  EPINEPHrine 0.3 mg/0.3 mL IJ SOAJ injection Inject 0.3 mg into the muscle as needed for anaphylaxis.  08/08/18  Yes [provider]  ezetimibe (ZETIA) 10 MG tablet TAKE 1 TABLET EVERY DAY 09/22/23  Yes Sheilah Pigeon, PA-C  Flaxseed, Linseed, (FLAXSEED OIL) 1200 MG CAPS Take 1,200 mg by mouth daily.   Yes [provider]  fluticasone  (FLONASE) 50 MCG/ACT nasal spray Place 2 sprays into both nostrils daily as needed for allergies or rhinitis.   Yes [provider]  furosemide (LASIX) 80 MG tablet TAKE 1 TABLET TWICE A DAY. MAY TAKE AN ADDITIONAL 80MG  (1 TABLET) AS NEEDED FOR EDEMA Patient taking differently: Take 160 mg by mouth daily. TAKE 1 TABLET TWICE A DAY. MAY TAKE AN ADDITIONAL 80MG  (1 TABLET) AS NEEDED FOR EDEMA 09/22/23  Yes Sheilah Pigeon, PA-C  hydrALAZINE (APRESOLINE) 25 MG tablet TAKE 1 TABLET TWICE DAILY 09/22/23  Yes Sheilah Pigeon, PA-C  isosorbide mononitrate (IMDUR) 60 MG 24 hr tablet TAKE 1 TABLET EVERY DAY 09/22/23  Yes Sheilah Pigeon, PA-C  Ketotifen Fumarate (ALAWAY OP) Place 1 drop into both eyes daily as needed (allergies).    Yes [provider]  metoprolol succinate (TOPROL-XL) 100 MG 24 hr tablet TAKE 2 TABLETS EVERY DAY WITH OR IMMEDIATELY FOLLOWING A MEAL 09/22/23  Yes Sheilah Pigeon, PA-C  Multiple Vitamin (MULTIVITAMIN WITH MINERALS) TABS Take 1 tablet by mouth daily.   Yes [provider]  nitroGLYCERIN (NITROSTAT) 0.4 MG SL tablet Place 1 tablet under the tongue every 5 minutes as needed for chest pain, max 3 doses, go to er if no relief 01/29/22  Yes Lyn Records, MD  olmesartan (BENICAR) 40 MG tablet Take 1 tablet (40 mg total) by mouth daily. 10/10/23  Yes Runell Gess, MD  OZEMPIC, 0.25 OR 0.5 MG/DOSE, 2 MG/1.5ML SOPN Inject 0.25 mg into the skin once a week. Sunday 08/30/19  Yes [provider]  pantoprazole (PROTONIX) 40 MG tablet TAKE 1 TABLET BY MOUTH  DAILY 09/02/21  Yes Lyn Records, MD  potassium chloride (KLOR-CON M) 10 MEQ tablet TAKE 2 TABLETS TWICE A DAY (KEEP MD APPOINTMENT FOR REFILLS) Patient taking differently: Take 20 mEq by mouth daily. TAKE 2 TABLETS TWICE A DAY (KEEP MD APPOINTMENT FOR REFILLS) 06/06/23  Yes Runell Gess, MD  simvastatin (ZOCOR) 40 MG tablet Take 1 tablet (40 mg total) by mouth at bedtime. Patient  taking differently: Take 40 mg by mouth daily. 10/10/23  Yes Runell Gess, MD  sodium chloride (MURO 128) 5 % ophthalmic ointment Place 1 drop into both eyes at bedtime as needed for eye irritation. For dry eyes   Yes [provider]  traMADol (ULTRAM) 50 MG tablet  Take 100 mg by mouth every 6 (six) hours as needed (MIGRAINES).   Yes [provider]  venlafaxine (EFFEXOR) 100 MG tablet Take 100-200 mg by mouth See admin instructions. TAKE 200 mg by mouth in the morning and take 100 mg at noon 04/12/11  Yes [provider]  vitamin A 7500 UNIT capsule Take 2,400 Units by mouth daily.   Yes [provider]  Alcohol Swabs (B-D SINGLE USE SWABS REGULAR) PADS  03/05/19   [provider]  DROPLET PEN NEEDLES 32G X 4 MM MISC  03/05/19   [provider]  LANTUS SOLOSTAR 100 UNIT/ML Solostar Pen Inject 6 Units into the skin at bedtime. Patient not taking: Reported on 10/19/2023 04/10/19   [provider]  TRUE METRIX BLOOD GLUCOSE TEST test strip  02/27/19   [provider]  TRUEplus Lancets 33G MISC  02/27/19   [provider]    Inpatient Medications: Scheduled Meds:  ALPRAZolam  1 mg Oral TID   aspirin EC  325 mg Oral Daily   clopidogrel  75 mg Oral Daily   doxazosin  4 mg Oral QHS   enoxaparin (LOVENOX) injection  30 mg Subcutaneous Daily   ezetimibe  10 mg Oral Daily   furosemide  160 mg Oral Daily   hydrALAZINE  25 mg Oral BID   irbesartan  300 mg Oral Daily   isosorbide mononitrate  60 mg Oral Daily   loratadine  10 mg Oral Daily   metoprolol succinate  200 mg Oral Daily   pantoprazole  40 mg Oral Daily   potassium chloride  20 mEq Oral Daily   simvastatin  40 mg Oral Daily   venlafaxine  200 mg Oral Q breakfast   And   venlafaxine  100 mg Oral Q1200   Continuous Infusions:  PRN Meds:   Allergies:    Allergies  Allergen Reactions   Calcium Channel Blockers Other (See Comments)    Cannot take non-DHP  CCBs (diltiazem, verapamil) due to CHF requiring ICD   Codeine Anaphylaxis and Other (See Comments)   Lyrica [Pregabalin]     Nightmares, and swelling   Other Anaphylaxis and Other (See Comments)    Walnuts, pecans, and ANY melons PATIENT IS A VEGETARIAN    Ramipril Anaphylaxis, Swelling and Other (See Comments)   Ticlid [Ticlopidine Hcl] Other (See Comments)    Unprovoked bleeding while on Ticlid (doesn't think she was on ASA at the time) Takes Plavix without problems   Morphine And Codeine Other (See Comments)    Severe AMS, confusion, weakness Tolerates Fentanyl, Tramadol   Tape Dermatitis and Rash    Tolerates paper tape   Cucumber Extract Other (See Comments)   Digoxin Other (See Comments)   Digoxin And Related Nausea Only    Unknown   Diltiazem Hcl Other (See Comments)   Diltiazem Hcl Er Beads Other (See Comments)   Metformin Hcl Other (See Comments)   Metolazone Other (See Comments)    Cannot remember reaction   Milk-Related Compounds Other (See Comments)    Bad gas   Morphine Sulfate Other (See Comments)   Myrbetriq [Mirabegron] Other (See Comments)    Aggravates migraines   Nsaids Other (See Comments)   Onglyza [Saxagliptin] Other (See Comments)    Exacerbates migraines   Penicillin V Other (See Comments)   Penicillins Hives    Has patient had a PCN reaction causing immediate rash, facial/tongue/throat swelling, SOB or lightheadedness with hypotension: Yes Has patient had a PCN  reaction causing severe rash involving mucus membranes or skin necrosis: Unknown Has patient had a PCN reaction that required hospitalization: Unknown Has patient had a PCN reaction occurring within the last 10 years: No If all of the above answers are "NO", then may proceed with Cephalosporin use.    Spironolactone Other (See Comments)    Cannot remember reaction   Sulfa Antibiotics Hives and Other (See Comments)   Tetracycline Hcl Other (See Comments)   Tetracyclines & Related Other  (See Comments)    Cannot remember reaction Tolerates macrolides (azithromycin)   Ticlopidine Other (See Comments)   Verapamil Other (See Comments)    Pacemaker/CHF   Vicodin [Hydrocodone-Acetaminophen] Other (See Comments)    EXTREME LETHARGY   Latex Itching, Rash and Other (See Comments)    Social History:   Social History   Socioeconomic History   Marital status: Married    Spouse name: Peyton Najjar   Number of children: 1   Years of education: Not on file   Highest education level: Master's degree (e.g., MA, MS, MEng, MEd, MSW, MBA)  Occupational History    Employer: UNEMPLOYED    Comment: Disablity  Tobacco Use   Smoking status: Never   Smokeless tobacco: Never  Vaping Use   Vaping status: Never Used  Substance and Sexual Activity   Alcohol use: No    Alcohol/week: 0.0 standard drinks of alcohol   Drug use: No   Sexual activity: Yes    Birth control/protection: Surgical  Other Topics Concern   Not on file  Social History Narrative   Caffeine Use: none   Social Drivers of Corporate investment banker Strain: Not on file  Food Insecurity: No Food Insecurity (10/20/2023)   Hunger Vital Sign    Worried About Running Out of Food in the Last Year: Never true    Ran Out of Food in the Last Year: Never true  Transportation Needs: No Transportation Needs (10/20/2023)   PRAPARE - Administrator, Civil Service (Medical): No    Lack of Transportation (Non-Medical): No  Physical Activity: Not on file  Stress: Not on file  Social Connections: Not on file  Intimate Partner Violence: Not At Risk (10/20/2023)   Humiliation, Afraid, Rape, and Kick questionnaire    Fear of Current or Ex-Partner: No    Emotionally Abused: No    Physically Abused: No    Sexually Abused: No    Family History:   Family History  Problem Relation Age of Onset   Heart disease Mother    Hypertension Mother    Heart attack Mother    Stroke Mother    Heart disease Father    Hypertension  Father    Diabetes Father    Kidney failure Father    Heart attack Father    Stroke Father    Heart disease Brother    Diabetes Brother    Kidney failure Brother    Heart attack Brother    Diabetes Paternal Grandmother      ROS:  Please see the history of present illness.  All other ROS reviewed and negative.     Physical Exam/Data:   Vitals:   10/20/23 1059 10/20/23 1100 10/20/23 1400 10/20/23 1421  BP: 132/77 134/86 106/81 114/78  Pulse: 85 90 92 86  Resp:  18 18 16   Temp:  98.6 F (37 C)  97.9 F (36.6 C)  TempSrc:  Oral  Oral  SpO2:  100% 99%   Weight:  51.2 kg  Height:        Intake/Output Summary (Last 24 hours) at 10/20/2023 1521 Last data filed at 10/20/2023 1336 Gross per 24 hour  Intake 944.51 ml  Output --  Net 944.51 ml      10/20/2023    2:21 PM 10/20/2023   12:21 AM 09/20/2023    1:33 PM  Last 3 Weights  Weight (lbs) 112 lb 14 oz 113 lb 116 lb 6.5 oz  Weight (kg) 51.2 kg 51.256 kg 52.8 kg     Body mass index is 20 kg/m.  General:  Well nourished, well developed, in no acute distress HEENT: normal Neck: no JVD Vascular: No carotid bruits; Distal pulses 2+ bilaterally Cardiac: RRR; no murmurs, gallops or rubs Lungs:  CTA b/l, no wheezing, rhonchi or rales  Abd: soft, nontender  Ext: no edema Musculoskeletal:  No deformities Skin: warm and dry  Neuro:  no focal abnormalities noted Psych:  Normal affect   EKG:  The EKG was personally reviewed and demonstrates:    ST 121bpm V paced ST 101bpm V paced  Telemetry:  Telemetry was personally reviewed and demonstrates:   SR   Relevant CV Studies:   10/20/23: TTE 1. Left ventricular ejection fraction, by estimation, is 45 to 50%. The  left ventricle has mildly decreased function. The left ventricle  demonstrates global hypokinesis. Left ventricular diastolic parameters are  indeterminate.   2. Right ventricular systolic function is low normal. The right  ventricular size is normal.  Tricuspid regurgitation signal is inadequate  for assessing PA pressure.   3. Right atrial size was mild to moderately dilated.   4. The mitral valve is grossly normal. Mild mitral valve regurgitation.  No evidence of mitral stenosis.   5. The aortic valve is tricuspid. There is mild calcification of the  aortic valve. Aortic valve regurgitation is not visualized. No aortic  stenosis is present.   6. The pulmonic valve was abnormal.   7. The inferior vena cava is normal in size with greater than 50%  respiratory variability, suggesting right atrial pressure of 3 mmHg.   Comparison(s): No significant change from prior study.     09/29/22: stress myoview The study shows normal myocardial perfusion. The study is low risk.   No ST deviation was noted.   LV perfusion is normal.   Left ventricular function is abnormal. Global function is mildly reduced. Nuclear stress EF: 52 %. The left ventricular ejection fraction is mildly decreased (45-54%). End diastolic cavity size is normal. End systolic cavity size is normal.   09/29/22: TTE 1. Left ventricular ejection fraction, by estimation, is 45 to 50%. Left  ventricular ejection fraction by 2D MOD biplane is 48.4 %. The left  ventricle has mildly decreased function. The left ventricle has no  regional wall motion abnormalities. Left  ventricular diastolic parameters are consistent with Grade I diastolic  dysfunction (impaired relaxation).   2. Right ventricular systolic function is mildly reduced. The right  ventricular size is normal. There is normal pulmonary artery systolic  pressure. The estimated right ventricular systolic pressure is 21.8 mmHg.   3. The mitral valve is abnormal. Mild mitral valve regurgitation.   4. The aortic valve is tricuspid. Aortic valve regurgitation is not  visualized.   5. The inferior vena cava is normal in size with greater than 50%  respiratory variability, suggesting right atrial pressure of 3 mmHg.    Comparison(s): 01/24/20 EF 50-55%. GLS -15.9%.      LEFT  HEART CATH AND CORS/GRAFTS ANGIOGRAPHY 08/04/2018  Conclusion      Prox RCA lesion is 100% stenosed. SVG graft was visualized by angiography and is normal in caliber. Ost 1st Diag lesion is 100% stenosed. SVG graft was visualized by angiography and is normal in caliber. Prox LAD to Mid LAD lesion is 90% stenosed. Mid LM to Dist LM lesion is 50% stenosed. Mid Cx to Dist Cx lesion is 60% stenosed. Ost 2nd Mrg lesion is 50% stenosed.   1. Severe native vessel CAD s/p 3V CABG with 3/3 patent grafts 2. Mild distal left main stenosis 3. Severe mid LAD stenosis. Antegrade flow and retrograde flow in the mid and distal LAD. The LIMA to the mid LAD is patent. The Diagonal is occluded. The SVG to the Diagonal is patent.  4. The Circumflex is a large caliber artery with mild plaque in the mid segment. The distal AV groove Circumflex has chronic diffuse stenosis 5. The RCA is chronically occluded in the proximal segment. The SVG to the PDA is patent.    Recommendations: Continue medical management of CAD    Echo 12/2015 Study Conclusions   - Left ventricle: The cavity size was normal. Systolic function was   mildly reduced. The estimated ejection fraction was in the range   of 45% to 50%. Diffuse hypokinesis. Features are consistent with   a pseudonormal left ventricular filling pattern, with concomitant   abnormal relaxation and increased filling pressure (grade 2   diastolic dysfunction). - Mitral valve: There was moderate regurgitation. - Left atrium: The atrium was moderately dilated. - Right ventricle: Poorly visualized. - Tricuspid valve: There was mild regurgitation.    Laboratory Data:  High Sensitivity Troponin:   Recent Labs  Lab 10/19/23 1522 10/19/23 1644  TROPONINIHS 44* 38*     Chemistry Recent Labs  Lab 10/19/23 1522 10/19/23 1644 10/20/23 0522  NA 140  --  138  K 4.1  --  3.5  CL 99  --  101  CO2 30   --  26  GLUCOSE 84  --  105*  BUN 38*  --  37*  CREATININE 2.58*  --  2.79*  CALCIUM 9.5  --  9.0  MG  --  2.3  --   GFRNONAA 19*  --  18*  ANIONGAP 11  --  11    Recent Labs  Lab 10/19/23 1644  PROT 6.6  ALBUMIN 3.7  AST 28  ALT 23  ALKPHOS 42  BILITOT 0.7   Lipids No results for input(s): "CHOL", "TRIG", "HDL", "LABVLDL", "LDLCALC", "CHOLHDL" in the last 168 hours.  Hematology Recent Labs  Lab 10/19/23 1522 10/20/23 0522  WBC 6.4 6.5  RBC 4.27 3.97  HGB 12.5 11.9*  HCT 37.0 34.1*  MCV 86.7 85.9  MCH 29.3 30.0  MCHC 33.8 34.9  RDW 12.7 12.8  PLT 202 167   Thyroid  Recent Labs  Lab 10/19/23 1522  TSH 1.731    BNP Recent Labs  Lab 10/19/23 1522  BNP 22.2    DDimer No results for input(s): "DDIMER" in the last 168 hours.   Radiology/Studies:   NM Pulmonary Perfusion Result Date: 10/20/2023 CLINICAL DATA:  Negative D-dimer. Concern for pulmonary embolism. Load intermediate probability. EXAM: NUCLEAR MEDICINE PERFUSION LUNG SCAN TECHNIQUE: Perfusion images were obtained in multiple projections after intravenous injection of radiopharmaceutical. RADIOPHARMACEUTICALS:  4.1 mCi Tc-30m MAA COMPARISON:  None Available. FINDINGS: No wedge-shaped peripheral perfusion defect within LEFT or RIGHT lung to suggest acute pulmonary embolism.  Normal perfusion pattern. Photopenia in the LEFT upper lobe related to cardiac pacer maker IMPRESSION: Normal pulmonary perfusion exam. No evidence acute pulmonary disease. Electronically Signed   By: Genevive Bi M.D.   On: 10/20/2023 13:54   DG Chest 2 View Result Date: 10/19/2023 CLINICAL DATA:  Shortness of breath. EXAM: CHEST - 2 VIEW COMPARISON:  Apr 06, 2022. FINDINGS: The heart size and mediastinal contours are within normal limits. Stable left-sided defibrillator. Status post coronary artery bypass graft. Both lungs are clear. The visualized skeletal structures are unremarkable. IMPRESSION: No active cardiopulmonary disease.  Electronically Signed   By: Lupita Raider M.D.   On: 10/19/2023 16:46     Assessment and Plan:   Palpitations Highly suspect this is an SVT though can not rule out slow VT, however hemodynamically stable without CP, near syncope or syncope.  2008 she had EPS/ablation of slow pathway at [facility] "The pt recalls that back then her heart would race and she would have to get the IV medicine that stopped my heart to reset it"  AAD options for her are limited to amiodarone Best strategy felt to have her get an EPS to identify her tachycardia SVT > ablate if able VT > amiodarone  Dr. Jimmey Ralph has seen the patient  I will see her next week and discuss with Dr. Ladona Ridgel then for management strategy. Continue her Toprol 200mg  daily ADD: lopressor 25mg  Q6 PRN for palpitations   Risk Assessment/Risk Scores:    For questions or updates, please contact [facility] Please consult [email or web address] for contact info under    Signed, Sheilah Pigeon, PA-C  10/20/2023 3:21 PM

## 2023-10-20 NOTE — ED Notes (Addendum)
Pt to [facility], well appealing upon transport.

## 2023-10-21 ENCOUNTER — Other Ambulatory Visit (HOSPITAL_COMMUNITY): Payer: Self-pay

## 2023-10-21 ENCOUNTER — Inpatient Hospital Stay (HOSPITAL_COMMUNITY): Admission: RE | Admit: 2023-10-21 | Payer: Medicare HMO | Source: Ambulatory Visit

## 2023-10-21 ENCOUNTER — Encounter (HOSPITAL_COMMUNITY): Payer: Self-pay

## 2023-10-21 DIAGNOSIS — I471 Supraventricular tachycardia, unspecified: Secondary | ICD-10-CM | POA: Diagnosis not present

## 2023-10-21 DIAGNOSIS — R002 Palpitations: Secondary | ICD-10-CM | POA: Diagnosis not present

## 2023-10-21 LAB — COMPREHENSIVE METABOLIC PANEL
ALT: 19 U/L (ref 0–44)
AST: 26 U/L (ref 15–41)
Albumin: 3.8 g/dL (ref 3.5–5.0)
Alkaline Phosphatase: 42 U/L (ref 38–126)
Anion gap: 8 (ref 5–15)
BUN: 36 mg/dL — ABNORMAL HIGH (ref 8–23)
CO2: 31 mmol/L (ref 22–32)
Calcium: 9.5 mg/dL (ref 8.9–10.3)
Chloride: 99 mmol/L (ref 98–111)
Creatinine, Ser: 2.69 mg/dL — ABNORMAL HIGH (ref 0.44–1.00)
GFR, Estimated: 18 mL/min — ABNORMAL LOW (ref >60)
Glucose, Bld: 89 mg/dL (ref 70–99)
Potassium: 4.2 mmol/L (ref 3.5–5.1)
Sodium: 138 mmol/L (ref 135–145)
Total Bilirubin: 0.6 mg/dL (ref <1.2)
Total Protein: 7.1 g/dL (ref 6.5–8.1)

## 2023-10-21 LAB — CBC WITH DIFFERENTIAL/PLATELET
Abs Immature Granulocytes: 0.01 10*3/uL (ref 0.00–0.07)
Basophils Absolute: 0.1 10*3/uL (ref 0.0–0.1)
Basophils Relative: 1 %
Eosinophils Absolute: 0.1 10*3/uL (ref 0.0–0.5)
Eosinophils Relative: 2 %
HCT: 36.2 % (ref 36.0–46.0)
Hemoglobin: 12.3 g/dL (ref 12.0–15.0)
Immature Granulocytes: 0 %
Lymphocytes Relative: 44 %
Lymphs Abs: 2.6 10*3/uL (ref 0.7–4.0)
MCH: 28.9 pg (ref 26.0–34.0)
MCHC: 34 g/dL (ref 30.0–36.0)
MCV: 85.2 fL (ref 80.0–100.0)
Monocytes Absolute: 0.6 10*3/uL (ref 0.1–1.0)
Monocytes Relative: 10 %
Neutro Abs: 2.6 10*3/uL (ref 1.7–7.7)
Neutrophils Relative %: 43 %
Platelets: 185 10*3/uL (ref 150–400)
RBC: 4.25 MIL/uL (ref 3.87–5.11)
RDW: 12.6 % (ref 11.5–15.5)
WBC: 6.1 10*3/uL (ref 4.0–10.5)
nRBC: 0 % (ref 0.0–0.2)

## 2023-10-21 LAB — PHOSPHORUS: Phosphorus: 5.2 mg/dL — ABNORMAL HIGH (ref 2.5–4.6)

## 2023-10-21 LAB — MAGNESIUM: Magnesium: 2.1 mg/dL (ref 1.7–2.4)

## 2023-10-21 MED ORDER — METOPROLOL TARTRATE 25 MG PO TABS
25.0000 mg | ORAL_TABLET | Freq: Four times a day (QID) | ORAL | 3 refills | Status: DC | PRN
  Filled 2023-10-21: qty 30, 8d supply, fill #0

## 2023-10-21 NOTE — Progress Notes (Addendum)
Rounding Note    Patient Name: [name] Date of Encounter: 10/21/2023  [facility] Cardiologist: Lesleigh Noe, MD (Inactive)   Subjective   Feels well, no palpitations since here  Inpatient Medications    Scheduled Meds:  ALPRAZolam  1 mg Oral TID   aspirin EC  325 mg Oral Daily   clopidogrel  75 mg Oral Daily   doxazosin  4 mg Oral QHS   enoxaparin (LOVENOX) injection  30 mg Subcutaneous Daily   ezetimibe  10 mg Oral Daily   furosemide  160 mg Oral Daily   hydrALAZINE  25 mg Oral BID   irbesartan  300 mg Oral Daily   isosorbide mononitrate  60 mg Oral Daily   loratadine  10 mg Oral Daily   metoprolol succinate  200 mg Oral Daily   pantoprazole  40 mg Oral Daily   potassium chloride  20 mEq Oral Daily   simvastatin  40 mg Oral Daily   venlafaxine  200 mg Oral Q breakfast   And   venlafaxine  100 mg Oral Q1200   Continuous Infusions:  PRN Meds:    Vital Signs    Vitals:   10/20/23 1400 10/20/23 1421 10/20/23 2019 10/21/23 0315  BP: 106/81 114/78 114/78 126/78  Pulse: 92 86 76 84  Resp: 18 16 16 18   Temp:  97.9 F (36.6 C) 97.7 F (36.5 C) 97.7 F (36.5 C)  TempSrc:  Oral Oral Oral  SpO2: 99%  99% 98%  Weight:  51.2 kg    Height:        Intake/Output Summary (Last 24 hours) at 10/21/2023 0836 Last data filed at 10/20/2023 2019 Gross per 24 hour  Intake 1064.51 ml  Output 1 ml  Net 1063.51 ml      10/20/2023    2:21 PM 10/20/2023   12:21 AM 09/20/2023    1:33 PM  Last 3 Weights  Weight (lbs) 112 lb 14 oz 113 lb 116 lb 6.5 oz  Weight (kg) 51.2 kg 51.256 kg 52.8 kg      Telemetry    SR/V paced - Personally Reviewed  ECG    No new EKGs - Personally Reviewed  Physical Exam   GEN: No acute distress.   Neck: No JVD Cardiac: RRR, no murmurs, rubs, or gallops.  Respiratory: CTA b/l GI: Soft, nontender, non-distended  MS: No edema; No deformity. Neuro:  Nonfocal  Psych: Normal affect   Labs    High  Sensitivity Troponin:   Recent Labs  Lab 10/19/23 1522 10/19/23 1644  TROPONINIHS 44* 38*     Chemistry Recent Labs  Lab 10/19/23 1522 10/19/23 1644 10/20/23 0522 10/21/23 0421  NA 140  --  138 138  K 4.1  --  3.5 4.2  CL 99  --  101 99  CO2 30  --  26 31  GLUCOSE 84  --  105* 89  BUN 38*  --  37* 36*  CREATININE 2.58*  --  2.79* 2.69*  CALCIUM 9.5  --  9.0 9.5  MG  --  2.3  --  2.1  PROT  --  6.6  --  7.1  ALBUMIN  --  3.7  --  3.8  AST  --  28  --  26  ALT  --  23  --  19  ALKPHOS  --  42  --  42  BILITOT  --  0.7  --  0.6  GFRNONAA 19*  --  18* 18*  ANIONGAP 11  --  11 8    Lipids No results for input(s): "CHOL", "TRIG", "HDL", "LABVLDL", "LDLCALC", "CHOLHDL" in the last 168 hours.  Hematology Recent Labs  Lab 10/19/23 1522 10/20/23 0522 10/21/23 0421  WBC 6.4 6.5 6.1  RBC 4.27 3.97 4.25  HGB 12.5 11.9* 12.3  HCT 37.0 34.1* 36.2  MCV 86.7 85.9 85.2  MCH 29.3 30.0 28.9  MCHC 33.8 34.9 34.0  RDW 12.7 12.8 12.6  PLT 202 167 185   Thyroid  Recent Labs  Lab 10/19/23 1522  TSH 1.731    BNP Recent Labs  Lab 10/19/23 1522  BNP 22.2    DDimer No results for input(s): "DDIMER" in the last 168 hours.   Radiology     NM Pulmonary Perfusion Result Date: 10/20/2023 CLINICAL DATA:  Negative D-dimer. Concern for pulmonary embolism. Load intermediate probability. EXAM: NUCLEAR MEDICINE PERFUSION LUNG SCAN TECHNIQUE: Perfusion images were obtained in multiple projections after intravenous injection of radiopharmaceutical. RADIOPHARMACEUTICALS:  4.1 mCi Tc-51m MAA COMPARISON:  None Available. FINDINGS: No wedge-shaped peripheral perfusion defect within LEFT or RIGHT lung to suggest acute pulmonary embolism. Normal perfusion pattern. Photopenia in the LEFT upper lobe related to cardiac pacer maker IMPRESSION: Normal pulmonary perfusion exam. No evidence acute pulmonary disease. Electronically Signed   By: Genevive Bi M.D.   On: 10/20/2023 13:54   DG Chest 2  View Result Date: 10/19/2023 CLINICAL DATA:  Shortness of breath. EXAM: CHEST - 2 VIEW COMPARISON:  Apr 06, 2022. FINDINGS: The heart size and mediastinal contours are within normal limits. Stable left-sided defibrillator. Status post coronary artery bypass graft. Both lungs are clear. The visualized skeletal structures are unremarkable. IMPRESSION: No active cardiopulmonary disease. Electronically Signed   By: Lupita Raider M.D.   On: 10/19/2023 16:46    Cardiac Studies   10/20/23: TTE 1. Left ventricular ejection fraction, by estimation, is 45 to 50%. The  left ventricle has mildly decreased function. The left ventricle  demonstrates global hypokinesis. Left ventricular diastolic parameters are  indeterminate.   2. Right ventricular systolic function is low normal. The right  ventricular size is normal. Tricuspid regurgitation signal is inadequate  for assessing PA pressure.   3. Right atrial size was mild to moderately dilated.   4. The mitral valve is grossly normal. Mild mitral valve regurgitation.  No evidence of mitral stenosis.   5. The aortic valve is tricuspid. There is mild calcification of the  aortic valve. Aortic valve regurgitation is not visualized. No aortic  stenosis is present.   6. The pulmonic valve was abnormal.   7. The inferior vena cava is normal in size with greater than 50%  respiratory variability, suggesting right atrial pressure of 3 mmHg.   Comparison(s): No significant change from prior study.   09/29/22: stress myoview The study shows normal myocardial perfusion. The study is low risk.   No ST deviation was noted.   LV perfusion is normal.   Left ventricular function is abnormal. Global function is mildly reduced. Nuclear stress EF: 52 %. The left ventricular ejection fraction is mildly decreased (45-54%). End diastolic cavity size is normal. End systolic cavity size is normal.   09/29/22: TTE 1. Left ventricular ejection fraction, by estimation,  is 45 to 50%. Left  ventricular ejection fraction by 2D MOD biplane is 48.4 %. The left  ventricle has mildly decreased function. The left ventricle has no  regional wall motion abnormalities. Left  ventricular diastolic parameters are consistent  with Grade I diastolic  dysfunction (impaired relaxation).   2. Right ventricular systolic function is mildly reduced. The right  ventricular size is normal. There is normal pulmonary artery systolic  pressure. The estimated right ventricular systolic pressure is 21.8 mmHg.   3. The mitral valve is abnormal. Mild mitral valve regurgitation.   4. The aortic valve is tricuspid. Aortic valve regurgitation is not  visualized.   5. The inferior vena cava is normal in size with greater than 50%  respiratory variability, suggesting right atrial pressure of 3 mmHg.   Comparison(s): 01/24/20 EF 50-55%. GLS -15.9%.      LEFT HEART CATH AND CORS/GRAFTS ANGIOGRAPHY 08/04/2018  Conclusion      Prox RCA lesion is 100% stenosed. SVG graft was visualized by angiography and is normal in caliber. Ost 1st Diag lesion is 100% stenosed. SVG graft was visualized by angiography and is normal in caliber. Prox LAD to Mid LAD lesion is 90% stenosed. Mid LM to Dist LM lesion is 50% stenosed. Mid Cx to Dist Cx lesion is 60% stenosed. Ost 2nd Mrg lesion is 50% stenosed.   1. Severe native vessel CAD s/p 3V CABG with 3/3 patent grafts 2. Mild distal left main stenosis 3. Severe mid LAD stenosis. Antegrade flow and retrograde flow in the mid and distal LAD. The LIMA to the mid LAD is patent. The Diagonal is occluded. The SVG to the Diagonal is patent.  4. The Circumflex is a large caliber artery with mild plaque in the mid segment. The distal AV groove Circumflex has chronic diffuse stenosis 5. The RCA is chronically occluded in the proximal segment. The SVG to the PDA is patent.    Recommendations: Continue medical management of CAD    Echo 12/2015 Study  Conclusions   - Left ventricle: The cavity size was normal. Systolic function was   mildly reduced. The estimated ejection fraction was in the range   of 45% to 50%. Diffuse hypokinesis. Features are consistent with   a pseudonormal left ventricular filling pattern, with concomitant   abnormal relaxation and increased filling pressure (grade 2   diastolic dysfunction). - Mitral valve: There was moderate regurgitation. - Left atrium: The atrium was moderately dilated. - Right ventricle: Poorly visualized. - Tricuspid valve: There was mild regurgitation.      Patient Profile     72 y.o. female with a hx of anxiety, asthma, CAD (CABG 1998), ICM, chronic CHF (systolic), ICD, HTN, HLD, stroke, DM, VHD (mod MR), CKD (IV, being evaluated for kidney transplant), remote hx of SVT (ablated 2008 at [facility]) admitted with palpitations   ICD care link transmission reviewed Battery est to ERI is 2 mo Lead measurements are good 2 monitored VT episodes In review have a 1:1 correlation though V seems to come 1st initially though nearly simultaneously then transitions to A 1st again nearly simultaneously No treated episodes Rates 150's  All noted yesterday (none Monday Tuesday logged by the device)_  One V sensed episode yesterday lasting  1hour 36 minutes   Device information MDT CRT-D implanted 06/28/2007, gen change 01/29/2015  Assessment & Plan    Palpitations Highly suspect this is an SVT though can not rule out slow VT, however hemodynamically stable without CP, near syncope or syncope.  None further here   2008 she had EPS/ablation of slow pathway at [facility] "The pt recalls that back then her heart would race and she would have to get the IV medicine that stopped my heart to reset it"  AAD options for her are limited to amiodarone Best strategy felt to have her get an EPS to identify her tachycardia SVT > ablate if able VT > amiodarone    Dr. Jimmey Ralph will see later this morning,  anticipate OK for discharge from [place] She has an appointment  her next week and discuss with Dr. Ladona Ridgel then for management strategy. Continue her Toprol 200mg  daily ADD: lopressor 25mg  Q6 PRN for palpitations (I will order)   ADDEND: Dr. Jimmey Ralph has seen the patient OK to discharge from our perspective when ready medically otherwise  For questions or updates, please contact [facility] Please consult [email or web address] for contact info under        Signed, Sheilah Pigeon, PA-C  10/21/2023, 8:36 AM

## 2023-10-21 NOTE — Care Management Obs Status (Signed)
MEDICARE OBSERVATION STATUS NOTIFICATION   Patient Details  Name: [name] MRN: [id number] Date of Birth: 07-Feb-1951   Medicare Observation Status Notification Given:  Yes    Tom-Johnson, Hershal Coria, RN 10/21/2023, 10:04 AM

## 2023-10-21 NOTE — Discharge Summary (Addendum)
Physician Discharge Summary  [name]:[id number] DOB: 12-05-1950 DOA: 10/19/2023  PCP: Merri Brunette, MD  Admit date: 10/19/2023 Discharge date: 10/21/2023  Time spent: 40 minutes  Recommendations for Outpatient Follow-up:  Follow outpatient CBC/CMP  Follow with EP/cardiology outpatient   Discharge Diagnoses:  Principal Problem:   Palpitation Active Problems:   Coronary artery disease involving coronary bypass graft of native heart with angina pectoris (HCC)   Anxiety   HTN (hypertension)   DM (diabetes mellitus) (HCC)   Chronic diastolic heart failure (HCC)   Hyperlipidemia   ESRD (end stage renal disease) (HCC)   Discharge Condition: stable  Diet recommendation: heart healthy   Filed Weights   10/20/23 0021 10/20/23 1421  Weight: 51.3 kg 51.2 kg    History of present illness:   [name] is [name] 72 y.o. female with medical history significant of ESRD, T2DM, HTN, CAD s/p CABG with subsequent stent, HFpEF s/p ICD, CVA, depression and anxiety who presents with palpitations, dyspnea and dizziness.   Interrogation showed findings concerning for VT.  EP following, concerned regarding SVT (can't rule out slow VT).  Plan to continue home metop, add prn short acting metop for palpitations and follow up outpatient.   Hospital Course:  Assessment and Plan:  Palpitations -presented with symptoms of palpitation with tachycardia, dyspnea and tremor -negative VQ scan  -TSH wnl -Has remote history of SVT and currently has defibrillator. pt 's interrogation report demonstrated 2 episodes of VT with longest lasting close to 11 mins.  -appreciate EP recs -> they're concerned regarding SVT (can't rule out slow VT) -> planning continue metop, add metop 25 mg q6 prn palpitations and follow outpatient with Dr. Ladona Ridgel next week   ESRD (end stage renal disease) (HCC) -Cr is stable. Pt currently on [facility] kidney transplant list.  -has infusion today for epo, discussed  with renal, no need today with normal Hb/Hct   Hyperlipidemia -continue Zetia   Chronic diastolic heart failure (HCC) -last echocardiogram with EF of 45 to 50% and grade 1 diastolic dysfunction, mild mitral valve regurgitation on 09/2022. -repeat echo 12/12 with EF 45-50%, no significant change   DM (diabetes mellitus) (HCC) Controlled.  Last hemoglobin A1c of 5.8 on 09/2023 -Has been controlled on Ozempic and diet   HTN (hypertension) - lasix, hydralazine, avapro, imdur, metoprolol, doxazosin -continue beta-blocker   Anxiety -continue Xanax and Effexor   Coronary artery disease involving coronary bypass graft of native heart with angina pectoris (HCC) -continue Plavix, Zetia and Imdur     Procedures: Echo IMPRESSIONS     1. Left ventricular ejection fraction, by estimation, is 45 to 50%. The  left ventricle has mildly decreased function. The left ventricle  demonstrates global hypokinesis. Left ventricular diastolic parameters are  indeterminate.   2. Right ventricular systolic function is low normal. The right  ventricular size is normal. Tricuspid regurgitation signal is inadequate  for assessing PA pressure.   3. Right atrial size was mild to moderately dilated.   4. The mitral valve is grossly normal. Mild mitral valve regurgitation.  No evidence of mitral stenosis.   5. The aortic valve is tricuspid. There is mild calcification of the  aortic valve. Aortic valve regurgitation is not visualized. No aortic  stenosis is present.   6. The pulmonic valve was abnormal.   7. The inferior vena cava is normal in size with greater than 50%  respiratory variability, suggesting right atrial pressure of 3 mmHg.   Comparison(s): No significant change from  prior study.    Consultations: cardiology  Discharge Exam: Vitals:   10/21/23 0315 10/21/23 0900  BP: 126/78 119/88  Pulse: 84   Resp: 18 16  Temp: 97.7 F (36.5 C) 98.2 F (36.8 C)  SpO2: 98%    No  complaints Asking about her infusion, eager for discharge  General: No acute distress. Lungs: unlabored Neurological: Alert and oriented 3. Moves all extremities 4. Cranial nerves II through XII grossly intact.   Discharge Instructions   Discharge Instructions     Call MD for:  difficulty breathing, headache or visual disturbances   Complete by: As directed    Call MD for:  extreme fatigue   Complete by: As directed    Call MD for:  hives   Complete by: As directed    Call MD for:  persistant dizziness or light-headedness   Complete by: As directed    Call MD for:  persistant nausea and vomiting   Complete by: As directed    Call MD for:  redness, tenderness, or signs of infection (pain, swelling, redness, odor or green/yellow discharge around incision site)   Complete by: As directed    Call MD for:  severe uncontrolled pain   Complete by: As directed    Call MD for:  temperature >100.4   Complete by: As directed    Diet - low sodium heart healthy   Complete by: As directed    Discharge instructions   Complete by: As directed    You were seen for palpitations.  You were seen by cardiology who plan to add an additional dose of metoprolol for you to use as needed.    Cardiology plans to follow up with you earlier next week to further address your abnormal heart rhythm.  Your blood counts are normal today, no need for your infusion.  Return for new, recurrent, or worsening symptoms.  Please ask your PCP to request records from this hospitalization so they know what was done and what the next steps will be.   Increase activity slowly   Complete by: As directed       Allergies as of 10/21/2023       Reactions   Calcium Channel Blockers Other (See Comments)   Cannot take non-DHP CCBs (diltiazem, verapamil) due to CHF requiring ICD   Codeine Anaphylaxis, Other (See Comments)   Lyrica [pregabalin]    Nightmares, and swelling   Other Anaphylaxis, Other (See  Comments)   Walnuts, pecans, and ANY melons PATIENT IS [name] VEGETARIAN   Ramipril Anaphylaxis, Swelling, Other (See Comments)   Ticlid [ticlopidine Hcl] Other (See Comments)   Unprovoked bleeding while on Ticlid (doesn't think she was on ASA at the time) Takes Plavix without problems   Morphine And Codeine Other (See Comments)   Severe AMS, confusion, weakness Tolerates Fentanyl, Tramadol   Tape Dermatitis, Rash   Tolerates paper tape   Cucumber Extract Other (See Comments)   Digoxin Other (See Comments)   Digoxin And Related Nausea Only   Unknown   Diltiazem Hcl Other (See Comments)   Diltiazem Hcl Er Beads Other (See Comments)   Metformin Hcl Other (See Comments)   Metolazone Other (See Comments)   Cannot remember reaction   Morphine Sulfate Other (See Comments)   Myrbetriq [mirabegron] Other (See Comments)   Aggravates migraines   Nsaids Other (See Comments)   Onglyza [saxagliptin] Other (See Comments)   Exacerbates migraines   Penicillin V Other (See Comments)   Penicillins Hives  Has patient had Nadalie Laughner PCN reaction causing immediate rash, facial/tongue/throat swelling, SOB or lightheadedness with hypotension: Yes Has patient had Reno Clasby PCN reaction causing severe rash involving mucus membranes or skin necrosis: Unknown Has patient had Julie Paolini PCN reaction that required hospitalization: Unknown Has patient had Chaquita Basques PCN reaction occurring within the last 10 years: No If all of the above answers are "NO", then may proceed with Cephalosporin use.   Spironolactone Other (See Comments)   Cannot remember reaction   Sulfa Antibiotics Hives, Other (See Comments)   Tetracycline Hcl Other (See Comments)   Tetracyclines & Related Other (See Comments)   Cannot remember reaction Tolerates macrolides (azithromycin)   Ticlopidine Other (See Comments)   Verapamil Other (See Comments)   Pacemaker/CHF   Vicodin [hydrocodone-acetaminophen] Other (See Comments)   EXTREME LETHARGY   Latex Itching, Rash,  Other (See Comments)        Medication List     PAUSE taking these medications    Lantus SoloStar 100 UNIT/ML Solostar Pen Wait to take this until your doctor or other care provider tells you to start again. Generic drug: insulin glargine Inject 6 Units into the skin at bedtime.       TAKE these medications    ALAWAY OP Place 1 drop into both eyes daily as needed (allergies).   albuterol 108 (90 Base) MCG/ACT inhaler Commonly known as: VENTOLIN HFA Inhale 2 puffs into the lungs every 4 (four) hours as needed for wheezing or shortness of breath.   ALPRAZolam 1 MG tablet Commonly known as: XANAX Take 1 mg by mouth 3 (three) times daily.   Artificial Tear Gel Place 2 drops into both eyes daily as needed (dry eyes).   aspirin EC 325 MG tablet Take 325 mg by mouth daily.   B-D SINGLE USE SWABS REGULAR Pads   BIOTIN PO Take 1 tablet by mouth daily.   cetirizine 10 MG tablet Commonly known as: ZYRTEC Take 10 mg by mouth daily.   clopidogrel 75 MG tablet Commonly known as: PLAVIX TAKE 1 TABLET EVERY DAY   doxazosin 4 MG tablet Commonly known as: CARDURA Take 4 mg by mouth at bedtime.   Droplet Pen Needles 32G X 4 MM Misc Generic drug: Insulin Pen Needle   EPINEPHrine 0.3 mg/0.3 mL Soaj injection Commonly known as: EPI-PEN Inject 0.3 mg into the muscle as needed for anaphylaxis.   ezetimibe 10 MG tablet Commonly known as: ZETIA TAKE 1 TABLET EVERY DAY   Flaxseed Oil 1200 MG Caps Take 1,200 mg by mouth daily.   fluticasone 50 MCG/ACT nasal spray Commonly known as: FLONASE Place 2 sprays into both nostrils daily as needed for allergies or rhinitis.   furosemide 80 MG tablet Commonly known as: LASIX TAKE 1 TABLET TWICE Alvena Kiernan DAY. MAY TAKE AN ADDITIONAL 80MG  (1 TABLET) AS NEEDED FOR EDEMA What changed: See the new instructions.   hydrALAZINE 25 MG tablet Commonly known as: APRESOLINE TAKE 1 TABLET TWICE DAILY   isosorbide mononitrate 60 MG 24 hr  tablet Commonly known as: IMDUR TAKE 1 TABLET EVERY DAY   metoprolol succinate 100 MG 24 hr tablet Commonly known as: TOPROL-XL TAKE 2 TABLETS EVERY DAY WITH OR IMMEDIATELY FOLLOWING [name] MEAL   metoprolol tartrate 25 MG tablet Commonly known as: LOPRESSOR Take 1 tablet (25 mg total) by mouth every 6 (six) hours as needed (palpitations).   multivitamin with minerals Tabs tablet Take 1 tablet by mouth daily.   nitroGLYCERIN 0.4 MG SL tablet Commonly known as: Nitrostat  Place 1 tablet under the tongue every 5 minutes as needed for chest pain, max 3 doses, go to er if no relief   olmesartan 40 MG tablet Commonly known as: BENICAR Take 1 tablet (40 mg total) by mouth daily.   Ozempic (0.25 or 0.5 MG/DOSE) 2 MG/1.5ML Sopn Generic drug: Semaglutide(0.25 or 0.5MG /DOS) Inject 0.25 mg into the skin once [name] week. Sunday   pantoprazole 40 MG tablet Commonly known as: PROTONIX TAKE 1 TABLET BY MOUTH  DAILY   potassium chloride 10 MEQ tablet Commonly known as: KLOR-CON M TAKE 2 TABLETS TWICE [name] DAY (KEEP MD APPOINTMENT FOR REFILLS) What changed:  how much to take how to take this when to take this   simvastatin 40 MG tablet Commonly known as: ZOCOR Take 1 tablet (40 mg total) by mouth at bedtime. What changed: when to take this   sodium chloride 5 % ophthalmic ointment Commonly known as: MURO 128 Place 1 drop into both eyes at bedtime as needed for eye irritation. For dry eyes   traMADol 50 MG tablet Commonly known as: ULTRAM Take 100 mg by mouth every 6 (six) hours as needed (MIGRAINES).   True Metrix Blood Glucose Test test strip Generic drug: glucose blood   TRUEplus Lancets 33G Misc   venlafaxine 100 MG tablet Commonly known as: EFFEXOR Take 100-200 mg by mouth See admin instructions. TAKE 200 mg by mouth in the morning and take 100 mg at noon   vitamin Kaegan Stigler 7500 UNIT capsule Take 2,400 Units by mouth daily.   Vitamin D-3 25 MCG (1000 UT) Caps Take 1,000 Units by mouth  daily.       Allergies  Allergen Reactions   Calcium Channel Blockers Other (See Comments)    Cannot take non-DHP CCBs (diltiazem, verapamil) due to CHF requiring ICD   Codeine Anaphylaxis and Other (See Comments)   Lyrica [Pregabalin]     Nightmares, and swelling   Other Anaphylaxis and Other (See Comments)    Walnuts, pecans, and ANY melons PATIENT IS Kasee Hantz VEGETARIAN    Ramipril Anaphylaxis, Swelling and Other (See Comments)   Ticlid [Ticlopidine Hcl] Other (See Comments)    Unprovoked bleeding while on Ticlid (doesn't think she was on ASA at the time) Takes Plavix without problems   Morphine And Codeine Other (See Comments)    Severe AMS, confusion, weakness Tolerates Fentanyl, Tramadol   Tape Dermatitis and Rash    Tolerates paper tape   Cucumber Extract Other (See Comments)   Digoxin Other (See Comments)   Digoxin And Related Nausea Only    Unknown   Diltiazem Hcl Other (See Comments)   Diltiazem Hcl Er Beads Other (See Comments)   Metformin Hcl Other (See Comments)   Metolazone Other (See Comments)    Cannot remember reaction   Morphine Sulfate Other (See Comments)   Myrbetriq [Mirabegron] Other (See Comments)    Aggravates migraines   Nsaids Other (See Comments)   Onglyza [Saxagliptin] Other (See Comments)    Exacerbates migraines   Penicillin V Other (See Comments)   Penicillins Hives    Has patient had Laiklyn Pilkenton PCN reaction causing immediate rash, facial/tongue/throat swelling, SOB or lightheadedness with hypotension: Yes Has patient had Jayline Kilburg PCN reaction causing severe rash involving mucus membranes or skin necrosis: Unknown Has patient had Camellia Popescu PCN reaction that required hospitalization: Unknown Has patient had Audi Wettstein PCN reaction occurring within the last 10 years: No If all of the above answers are "NO", then may proceed with Cephalosporin use.  Spironolactone Other (See Comments)    Cannot remember reaction   Sulfa Antibiotics Hives and Other (See Comments)    Tetracycline Hcl Other (See Comments)   Tetracyclines & Related Other (See Comments)    Cannot remember reaction Tolerates macrolides (azithromycin)   Ticlopidine Other (See Comments)   Verapamil Other (See Comments)    Pacemaker/CHF   Vicodin [Hydrocodone-Acetaminophen] Other (See Comments)    EXTREME LETHARGY   Latex Itching, Rash and Other (See Comments)      The results of significant diagnostics from this hospitalization (including imaging, microbiology, ancillary and laboratory) are listed below for reference.    Significant Diagnostic Studies: ECHOCARDIOGRAM COMPLETE Result Date: 10/20/2023    ECHOCARDIOGRAM REPORT   Patient Name:   RUTHELL OSTROW Date of Exam: 10/20/2023 Medical Rec #:  [id number]           Height:       63.0 in Accession #:    [id number]          Weight:       113.0 lb Date of Birth:  October 30, 1951           BSA:          1.517 m Patient Age:    72 years            BP:           129/89 mmHg Patient Gender: F                   HR:           93 bpm. Exam Location:  Inpatient Procedure: 2D Echo, Cardiac Doppler and Color Doppler Indications:    dyspnea  History:        Patient has prior history of Echocardiogram examinations, most                 recent 09/29/2022. Cardiomyopathy, Pacemaker and Defibrillator,                 Stroke; Risk Factors:Hypertension, Diabetes and Dyslipidemia.  Sonographer:    Melissa Morford RDCS (AE, PE) Referring Phys: [id number] CHING T TU IMPRESSIONS  1. Left ventricular ejection fraction, by estimation, is 45 to 50%. The left ventricle has mildly decreased function. The left ventricle demonstrates global hypokinesis. Left ventricular diastolic parameters are indeterminate.  2. Right ventricular systolic function is low normal. The right ventricular size is normal. Tricuspid regurgitation signal is inadequate for assessing PA pressure.  3. Right atrial size was mild to moderately dilated.  4. The mitral valve is grossly normal. Mild mitral valve  regurgitation. No evidence of mitral stenosis.  5. The aortic valve is tricuspid. There is mild calcification of the aortic valve. Aortic valve regurgitation is not visualized. No aortic stenosis is present.  6. The pulmonic valve was abnormal.  7. The inferior vena cava is normal in size with greater than 50% respiratory variability, suggesting right atrial pressure of 3 mmHg. Comparison(s): No significant change from prior study. FINDINGS  Left Ventricle: Left ventricular ejection fraction, by estimation, is 45 to 50%. The left ventricle has mildly decreased function. The left ventricle demonstrates global hypokinesis. The left ventricular internal cavity size was normal in size. There is  no left ventricular hypertrophy. Left ventricular diastolic parameters are indeterminate. Right Ventricle: The right ventricular size is normal. No increase in right ventricular wall thickness. Right ventricular systolic function is low normal. Tricuspid regurgitation signal is inadequate for assessing PA pressure. Left Atrium: Left atrial  size was normal in size. Right Atrium: Right atrial size was mild to moderately dilated. Pericardium: There is no evidence of pericardial effusion. Mitral Valve: The mitral valve is grossly normal. There is mild thickening of the mitral valve leaflet(s). There is mild calcification of the mitral valve leaflet(s). Mild mitral valve regurgitation. No evidence of mitral valve stenosis. Tricuspid Valve: The tricuspid valve is normal in structure. Tricuspid valve regurgitation is trivial. No evidence of tricuspid stenosis. Aortic Valve: The aortic valve is tricuspid. There is mild calcification of the aortic valve. Aortic valve regurgitation is not visualized. No aortic stenosis is present. Pulmonic Valve: The pulmonic valve was abnormal. Pulmonic valve regurgitation is not visualized. No evidence of pulmonic stenosis. Aorta: The aortic root, ascending aorta and aortic arch are all structurally  normal, with no evidence of dilitation or obstruction. Venous: The inferior vena cava is normal in size with greater than 50% respiratory variability, suggesting right atrial pressure of 3 mmHg. IAS/Shunts: The atrial septum is grossly normal. Additional Comments: Chasyn Cinque device lead is visualized.  LEFT VENTRICLE PLAX 2D LVIDd:         3.50 cm     Diastology LVIDs:         2.80 cm     LV e' medial:    4.68 cm/s LV PW:         0.90 cm     LV E/e' medial:  8.9 LV IVS:        0.80 cm     LV e' lateral:   3.92 cm/s LVOT diam:     2.20 cm     LV E/e' lateral: 10.6 LV SV:         51 LV SV Index:   34 LVOT Area:     3.80 cm                             3D Volume EF: LV Volumes (MOD)           3D EF:        43 % LV vol d, MOD A2C: 43.4 ml LV EDV:       127 ml LV vol d, MOD A4C: 65.8 ml LV ESV:       73 ml LV vol s, MOD A2C: 24.9 ml LV SV:        54 ml LV vol s, MOD A4C: 32.9 ml LV SV MOD A2C:     18.5 ml LV SV MOD A4C:     65.8 ml LV SV MOD BP:      25.2 ml RIGHT VENTRICLE            IVC RV S prime:     7.29 cm/s  IVC diam: 0.90 cm TAPSE (M-mode): 2.1 cm LEFT ATRIUM             Index        RIGHT ATRIUM           Index LA diam:        3.10 cm 2.04 cm/m   RA Area:     15.50 cm LA Vol (A2C):   14.8 ml 9.76 ml/m   RA Volume:   39.40 ml  25.97 ml/m LA Vol (A4C):   24.0 ml 15.82 ml/m LA Biplane Vol: 20.2 ml 13.32 ml/m  AORTIC VALVE LVOT Vmax:   71.10 cm/s LVOT Vmean:  56.500 cm/s LVOT VTI:    0.134 m  AORTA Ao Root diam: 2.90 cm Ao Asc diam:  3.70 cm MITRAL VALVE MV Area (PHT): 4.01 cm    SHUNTS MV Decel Time: 189 msec    Systemic VTI:  0.13 m MV E velocity: 41.60 cm/s  Systemic Diam: 2.20 cm MV [name] velocity: 76.30 cm/s MV E/[name] ratio:  0.55 Emily Red MD Electronically signed by Emily Red MD Signature Date/Time: 10/20/2023/2:05:47 PM    Final    NM Pulmonary Perfusion Result Date: 10/20/2023 CLINICAL DATA:  Negative D-dimer. Concern for pulmonary embolism. Load intermediate probability. EXAM: NUCLEAR  MEDICINE PERFUSION LUNG SCAN TECHNIQUE: Perfusion images were obtained in multiple projections after intravenous injection of radiopharmaceutical. RADIOPHARMACEUTICALS:  4.1 mCi Tc-84m MAA COMPARISON:  None Available. FINDINGS: No wedge-shaped peripheral perfusion defect within LEFT or RIGHT lung to suggest acute pulmonary embolism. Normal perfusion pattern. Photopenia in the LEFT upper lobe related to cardiac pacer maker IMPRESSION: Normal pulmonary perfusion exam. No evidence acute pulmonary disease. Electronically Signed   By: Genevive Bi M.D.   On: 10/20/2023 13:54   DG Chest 2 View Result Date: 10/19/2023 CLINICAL DATA:  Shortness of breath. EXAM: CHEST - 2 VIEW COMPARISON:  Apr 06, 2022. FINDINGS: The heart size and mediastinal contours are within normal limits. Stable left-sided defibrillator. Status post coronary artery bypass graft. Both lungs are clear. The visualized skeletal structures are unremarkable. IMPRESSION: No active cardiopulmonary disease. Electronically Signed   By: Lupita Raider M.D.   On: 10/19/2023 16:46    Microbiology: Recent Results (from the past 240 hours)  Culture, blood (Routine X 2) w Reflex to ID Panel     Status: None (Preliminary result)   Collection Time: 10/19/23 10:45 PM   Specimen: BLOOD LEFT FOREARM  Result Value Ref Range Status   Specimen Description BLOOD LEFT FOREARM  Final   Special Requests   Final    BOTTLES DRAWN AEROBIC AND ANAEROBIC Blood Culture results may not be optimal due to an inadequate volume of blood received in culture bottles   Culture   Final    NO GROWTH 1 DAY Performed at [facility], [address], [place], [place] [address]    Report Status PENDING  Incomplete  Resp panel by RT-PCR (RSV, Flu Eufelia Veno&B, Covid) Peripheral     Status: None   Collection Time: 10/19/23 10:48 PM   Specimen: Peripheral; Nasal Swab  Result Value Ref Range Status   SARS Coronavirus 2 by RT PCR NEGATIVE NEGATIVE Final   Influenza Easter Schinke by PCR  NEGATIVE NEGATIVE Final   Influenza B by PCR NEGATIVE NEGATIVE Final    Comment: (NOTE) The Xpert Xpress SARS-CoV-2/FLU/RSV plus assay is intended as an aid in the diagnosis of influenza from Nasopharyngeal swab specimens and should not be used as Yalonda Sample sole basis for treatment. Nasal washings and aspirates are unacceptable for Xpert Xpress SARS-CoV-2/FLU/RSV testing.  Fact Sheet for Patients: BloggerCourse.com  Fact Sheet for Healthcare Providers: SeriousBroker.it  This test is not yet approved or cleared by the [place] FDA and has been authorized for detection and/or diagnosis of SARS-CoV-2 by FDA under an Emergency Use Authorization (EUA). This EUA will remain in effect (meaning this test can be used) for the duration of the COVID-19 declaration under Section 564(b)(1) of the Act, 21 U.S.C. section 360bbb-3(b)(1), unless the authorization is terminated or revoked.     Resp Syncytial Virus by PCR NEGATIVE NEGATIVE Final    Comment: (NOTE) Fact Sheet for Patients: BloggerCourse.com  Fact Sheet for Healthcare Providers: SeriousBroker.it  This test is not yet approved or cleared by the [place] and has been authorized for detection and/or diagnosis of SARS-CoV-2 by FDA under an Emergency Use Authorization (EUA). This EUA will remain in effect (meaning this test can be used) for the duration of the COVID-19 declaration under Section 564(b)(1) of the Act, 21 U.S.C. section 360bbb-3(b)(1), unless the authorization is terminated or revoked.  Performed at [facility], [address], [place], [place] [address]   Culture, blood (Routine X 2) w Reflex to ID Panel     Status: None (Preliminary result)   Collection Time: 10/19/23 11:23 PM   Specimen: BLOOD  Result Value Ref Range Status   Specimen Description BLOOD RIGHT ANTECUBITAL  Final   Special Requests    Final    BOTTLES DRAWN AEROBIC AND ANAEROBIC Blood Culture results may not be optimal due to an excessive volume of blood received in culture bottles   Culture   Final    NO GROWTH 2 DAYS Performed at [facility], [address] [place], [place] [address]    Report Status PENDING  Incomplete     Labs: Basic Metabolic Panel: Recent Labs  Lab 10/19/23 1522 10/19/23 1644 10/20/23 0522 10/21/23 0421  NA 140  --  138 138  K 4.1  --  3.5 4.2  CL 99  --  101 99  CO2 30  --  26 31  GLUCOSE 84  --  105* 89  BUN 38*  --  37* 36*  CREATININE 2.58*  --  2.79* 2.69*  CALCIUM 9.5  --  9.0 9.5  MG  --  2.3  --  2.1  PHOS  --   --   --  5.2*   Liver Function Tests: Recent Labs  Lab 10/19/23 1644 10/21/23 0421  AST 28 26  ALT 23 19  ALKPHOS 42 42  BILITOT 0.7 0.6  PROT 6.6 7.1  ALBUMIN 3.7 3.8   No results for input(s): "LIPASE", "AMYLASE" in the last 168 hours. No results for input(s): "AMMONIA" in the last 168 hours. CBC: Recent Labs  Lab 10/19/23 1522 10/20/23 0522 10/21/23 0421  WBC 6.4 6.5 6.1  NEUTROABS  --   --  2.6  HGB 12.5 11.9* 12.3  HCT 37.0 34.1* 36.2  MCV 86.7 85.9 85.2  PLT 202 167 185   Cardiac Enzymes: No results for input(s): "CKTOTAL", "CKMB", "CKMBINDEX", "TROPONINI" in the last 168 hours. BNP: BNP (last 3 results) Recent Labs    10/19/23 1522  BNP 22.2    ProBNP (last 3 results) No results for input(s): "PROBNP" in the last 8760 hours.  CBG: Recent Labs  Lab 10/19/23 1632 10/19/23 1829  GLUCAP 90 81       Signed:  Lacretia Nicks MD.  [facility] 10/21/2023, 12:15 PM

## 2023-10-21 NOTE — TOC Transition Note (Signed)
Transition of Care [facility]) - Discharge Note   Patient Details  Name: [name] MRN: [id number] Date of Birth: [birth year]  Transition of Care [facility]) CM/SW Contact:  Tom-Johnson, Hershal Coria, RN Phone Number: 10/21/2023, 12:58 PM   Clinical Narrative:     Patient is scheduled for discharge today.  Outpatient f/u, hospital f/u and discharge instructions on AVS. Prescriptions sent to [facility] pharmacy and patient will receive meds prior discharge. No TOC needs or recommendations noted. Family to transport at discharge.  No further TOC needs noted.        Final next level of care: Home/Self Care Barriers to Discharge: Barriers Resolved   Patient Goals and CMS Choice Patient states their goals for this hospitalization and ongoing recovery are:: To return home CMS Medicare.gov Compare Post Acute Care list provided to:: Patient Choice offered to / list presented to : NA      Discharge Placement                Patient to be transferred to facility by: Family      Discharge Plan and Services Additional resources added to the After Visit Summary for                  DME Arranged: N/A DME Agency: NA       HH Arranged: NA HH Agency: NA        Social Drivers of Health (SDOH) Interventions SDOH Screenings   Food Insecurity: No Food Insecurity (10/20/2023)  Housing: Low Risk  (10/20/2023)  Transportation Needs: No Transportation Needs (10/20/2023)  Utilities: Not At Risk (10/20/2023)  Depression (PHQ2-9): High Risk (09/15/2023)  Tobacco Use: Low Risk  (10/20/2023)     Readmission Risk Interventions     No data to display

## 2023-10-23 NOTE — Progress Notes (Unsigned)
Cardiology Office Note Date:  10/23/2023  Patient ID:  [name], [name] 29-Dec-1950, MRN [id number] PCP:  Merri Brunette, MD  Cardiologist:  Dr. Katrinka Blazing >> Dr. Allyson Sabal Electrophysiologist: Dr. Ladona Ridgel    Chief Complaint:  post hospital visit  History of Present Illness: [name] is a 72 y.o. female with history of anxiety, asthma, CAD (CABG 1998), ICM, chronic CHF (systolic), ICD, HTN, HLD, stroke, DM, VHD (mod MR), CKD (IV), SVT (ablated AVNRT 2008)  She saw Dr. Ladona Ridgel 12/29/21, had been on Ozempic with successful weight loss, feeling well though mentioned an episode of CP near her device > felt to be atypical/not cardiac, pocket looked OK  She saw Dr. Allyson Sabal 04/29/23, he mentions she was on renal transplant list at [facility], for her CKD, doing well, no changes were made  I saw her 06/24/23 She is doing all in all well, following with her nephrologist and [facility] team closely Labs are done via them  No CP, palpitations Denies SOB No dizziness, near syncope or syncope She is worried bout her device battery. Approx 16mo to ERI, planned to follow closely   Admitted 10/19/23 with palpitations, new in the week, somewhat reminiscent of her remote SVT though perhaps not exactly, lasting several minutes to ours and came in Labs unremarkable Device interrogation noted a very short VA time. And on 2 occasions the arrhythmia appears to start with conduction from the atrium down the slow pathway with a longer AV interval , very suspicious of AVNRT though unable to r/o VT >> though hemodynamically tolerated well TTE with LVEF 45-50% (unchanged), RV OK, no significant VHD Dr. Jimmey Ralph suggested consideration of EP study > SVT ablate if VT start amiodarone Planned for early follow up out patient to d/w Dr. Ladona Ridgel next steps Given PRN lopressor No tachycardias or recurrent symptoms noted during her stay Discharged 10/21/23  TODAY She has not had recurrent palpitations No CP, SOB No near  syncope or syncope.  She remains tremulous. Intermittently quite "shaky" She has lost (unintentionally) weight since on Ozempic, but also able to get off insulin.  While here she becomes very emotional/crying, has been a hard 3-4 months. Outside of a sister-in-law, she is all alone. Her husband in a SNF 2/2 dementia after traumatic brain injury Her only child/daughter died in a car accident in July 17, 2023. With rise in emotions her tremor becomes marked, voice shaky as well HR went from 100's-110 >> 130's  the whole time AS/BP by her EGM. AS we talk and she settles, her HR does simmer back down, as does her tremor   Device information MDT CRT-D implanted 04/17/2008, gen change 2016  Past Medical History:  Diagnosis Date   AICD (automatic cardioverter/defibrillator) present    [employer]- Dr. Rosette Reveal follows   Anginal pain (HCC)    Anxiety    Asthma    Back pain    "pinched nerve-lower back" - Dr. Ethelene Hal follows.   Biventricular implantable cardioverter-defibrillator in situ 06/18/2011   Cerebral infarction (HCC) 11/11/2012   Cervical dysplasia    Chronic diastolic heart failure (HCC) 03/22/2016   Chronic kidney disease    Dr. Allena Katz follows.   Chronic renal insufficiency, stage III (moderate) (HCC) 11/11/2012   Complication of anesthesia    "I wake up during surgeries" (02/14/2013)   Coronary artery disease involving coronary bypass graft of native heart with angina pectoris (HCC) 06/18/2011   Depression    DM (diabetes mellitus) (HCC) 11/11/2012   Fibroid  Function kidney decreased    GERD (gastroesophageal reflux disease)    Hemiplegia, unspecified, affecting nondominant side 11/11/2012   Hepatitis    Hepatitis A -college yrs"water source exposure"   Hiatal hernia    History of shingles    2-3 yrs ago last out break "around waist"   History of stomach ulcers    Hyperlipidemia 07/30/2016   Hypertension    ICD (implantable cardiac defibrillator) in place    Iron deficiency anemia     Ischemic cardiomyopathy    status post biventricular ICD placed by DR Edumunds who used to see Dr Elsie Lincoln here to establish  cardiovascular care.   Migraines    MVP (mitral valve prolapse)    Antibiotics not required for procedures   Myocardial infarction (HCC)    "I've had 2; the others they were able to catch before completing" (02/14/2013)   Pacemaker    Paroxysmal SVT (supraventricular tachycardia) (HCC) 06/18/2011   Pneumonia 1950's & 1985   Shortness of breath    "lying down flat; at times w/exertion" (02/14/2013). 10-06-15 exertion only..   Stroke [facility])    "2 confirmed; 9 TIA's; results in dragging LLE; numbness in tip of tongue" (02/14/2013),10-06-15 right hand tends to be weaker when tired.   TIA (transient ischemic attack) 11/11/2012   Type II diabetes mellitus [facility])     Past Surgical History:  Procedure Laterality Date   ABDOMINAL HYSTERECTOMY  1985   TAH    BIV ICD GENERTAOR CHANGE OUT  06/2007; 04/2008   "2 lead initial placement, at [facility]; done at [facility], after developing CHF" (02/14/2013)   BIV PACEMAKER GENERATOR CHANGE OUT N/A 01/29/2015   Procedure: BIV PACEMAKER GENERATOR CHANGE OUT;  Surgeon: Marinus Maw, MD;  Location: [facility] CATH LAB;  Service: Cardiovascular;  Laterality: N/A;   BREAST EXCISIONAL BIOPSY Left 01/2007; 06/2007; 03/2008   "benign" (02/14/2013)   BREAST SURGERY     CARDIAC CATHETERIZATION     "probably in the teens" (02/14/2013)   CARDIAC DEFIBRILLATOR PLACEMENT     COLONOSCOPY  ~ 2002   COLONOSCOPY WITH PROPOFOL N/A 10/07/2015   Procedure: COLONOSCOPY WITH PROPOFOL;  Surgeon: Charna Elizabeth, MD;  Location: [facility] ENDOSCOPY;  Service: Endoscopy;  Laterality: N/A;   CORONARY ANGIOPLASTY WITH STENT PLACEMENT     "started out w/5; bypass corrected some; 1 stent since the bypass" (02/14/2013)   CORONARY ARTERY BYPASS GRAFT  ` 1998   LIMA-LAD, SVG-D1, SVG-PDA   DILATION AND CURETTAGE OF UTERUS  1975 X 2; 1976; 1977   INSERT / REPLACE / REMOVE PACEMAKER     biventricular  defibrillator--06/10/ 2009   LEFT HEART CATH AND CORS/GRAFTS ANGIOGRAPHY N/A 08/04/2018   Procedure: LEFT HEART CATH AND CORS/GRAFTS ANGIOGRAPHY;  Surgeon: Kathleene Hazel, MD;  Location: [facility] INVASIVE CV LAB;  Service: Cardiovascular;  Laterality: N/A;   LEFT HEART CATHETERIZATION WITH CORONARY/GRAFT ANGIOGRAM N/A 01/03/2015   Procedure: LEFT HEART CATHETERIZATION WITH Isabel Caprice;  Surgeon: Lesleigh Noe, MD;  Location: [facility] CATH LAB;  Service: Cardiovascular;  Laterality: N/A;   SUPRAVENTRICULAR TACHYCARDIA ABLATION  06/2007   TEE WITHOUT CARDIOVERSION  11/14/2012   Procedure: TRANSESOPHAGEAL ECHOCARDIOGRAM (TEE);  Surgeon: Donato Schultz, MD;  Location: [facility] ENDOSCOPY;  Service: Cardiovascular;  Laterality: N/A;    Current Outpatient Medications  Medication Sig Dispense Refill   albuterol (PROVENTIL HFA;VENTOLIN HFA) 108 (90 BASE) MCG/ACT inhaler Inhale 2 puffs into the lungs every 4 (four) hours as needed for wheezing or shortness of breath.  Alcohol Swabs (B-D SINGLE USE SWABS REGULAR) PADS      ALPRAZolam (XANAX) 1 MG tablet Take 1 mg by mouth 3 (three) times daily.   2   Artificial Tear GEL Place 2 drops into both eyes daily as needed (dry eyes).      aspirin 325 MG EC tablet Take 325 mg by mouth daily.     BIOTIN PO Take 1 tablet by mouth daily.     cetirizine (ZYRTEC) 10 MG tablet Take 10 mg by mouth daily.     Cholecalciferol (VITAMIN D-3) 1000 UNITS CAPS Take 1,000 Units by mouth daily.      clopidogrel (PLAVIX) 75 MG tablet TAKE 1 TABLET EVERY DAY 90 tablet 1   doxazosin (CARDURA) 4 MG tablet Take 4 mg by mouth at bedtime.      DROPLET PEN NEEDLES 32G X 4 MM MISC      EPINEPHrine 0.3 mg/0.3 mL IJ SOAJ injection Inject 0.3 mg into the muscle as needed for anaphylaxis.   3   ezetimibe (ZETIA) 10 MG tablet TAKE 1 TABLET EVERY DAY 90 tablet 1   Flaxseed, Linseed, (FLAXSEED OIL) 1200 MG CAPS Take 1,200 mg by mouth daily.     fluticasone (FLONASE) 50 MCG/ACT nasal spray  Place 2 sprays into both nostrils daily as needed for allergies or rhinitis.     furosemide (LASIX) 80 MG tablet TAKE 1 TABLET TWICE A DAY. MAY TAKE AN ADDITIONAL 80MG  (1 TABLET) AS NEEDED FOR EDEMA (Patient taking differently: Take 160 mg by mouth daily. TAKE 1 TABLET TWICE A DAY. MAY TAKE AN ADDITIONAL 80MG  (1 TABLET) AS NEEDED FOR EDEMA) 270 tablet 1   hydrALAZINE (APRESOLINE) 25 MG tablet TAKE 1 TABLET TWICE DAILY 180 tablet 1   isosorbide mononitrate (IMDUR) 60 MG 24 hr tablet TAKE 1 TABLET EVERY DAY 90 tablet 1   Ketotifen Fumarate (ALAWAY OP) Place 1 drop into both eyes daily as needed (allergies).      [Paused] LANTUS SOLOSTAR 100 UNIT/ML Solostar Pen Inject 6 Units into the skin at bedtime. (Patient not taking: Reported on 10/19/2023)     metoprolol succinate (TOPROL-XL) 100 MG 24 hr tablet TAKE 2 TABLETS EVERY DAY WITH OR IMMEDIATELY FOLLOWING A MEAL 180 tablet 1   metoprolol tartrate (LOPRESSOR) 25 MG tablet Take 1 tablet (25 mg total) by mouth every 6 (six) hours as needed (palpitations). 30 tablet 3   Multiple Vitamin (MULTIVITAMIN WITH MINERALS) TABS Take 1 tablet by mouth daily.     nitroGLYCERIN (NITROSTAT) 0.4 MG SL tablet Place 1 tablet under the tongue every 5 minutes as needed for chest pain, max 3 doses, go to er if no relief 75 tablet 2   olmesartan (BENICAR) 40 MG tablet Take 1 tablet (40 mg total) by mouth daily. 90 tablet 2   OZEMPIC, 0.25 OR 0.5 MG/DOSE, 2 MG/1.5ML SOPN Inject 0.25 mg into the skin once a week. Sunday     pantoprazole (PROTONIX) 40 MG tablet TAKE 1 TABLET BY MOUTH  DAILY 90 tablet 3   potassium chloride (KLOR-CON M) 10 MEQ tablet TAKE 2 TABLETS TWICE A DAY (KEEP MD APPOINTMENT FOR REFILLS) (Patient taking differently: Take 20 mEq by mouth daily. TAKE 2 TABLETS TWICE A DAY (KEEP MD APPOINTMENT FOR REFILLS)) 360 tablet 3   simvastatin (ZOCOR) 40 MG tablet Take 1 tablet (40 mg total) by mouth at bedtime. (Patient taking differently: Take 40 mg by mouth daily.) 90  tablet 2   sodium chloride (MURO 128) 5 %  ophthalmic ointment Place 1 drop into both eyes at bedtime as needed for eye irritation. For dry eyes     traMADol (ULTRAM) 50 MG tablet Take 100 mg by mouth every 6 (six) hours as needed (MIGRAINES).     TRUE METRIX BLOOD GLUCOSE TEST test strip      TRUEplus Lancets 33G MISC      venlafaxine (EFFEXOR) 100 MG tablet Take 100-200 mg by mouth See admin instructions. TAKE 200 mg by mouth in the morning and take 100 mg at noon     vitamin A 7500 UNIT capsule Take 2,400 Units by mouth daily.     No current facility-administered medications for this visit.    Allergies:   Calcium channel blockers, Codeine, Lyrica [pregabalin], Other, Ramipril, Ticlid [ticlopidine hcl], Morphine and codeine, Tape, Cucumber extract, Digoxin, Digoxin and related, Diltiazem hcl, Diltiazem hcl er beads, Metformin hcl, Metolazone, Morphine sulfate, Myrbetriq [mirabegron], Nsaids, Onglyza [saxagliptin], Penicillin v, Penicillins, Spironolactone, Sulfa antibiotics, Tetracycline hcl, Tetracyclines & related, Ticlopidine, Verapamil, Vicodin [hydrocodone-acetaminophen], and Latex   Social History:  The patient  reports that she has never smoked. She has never used smokeless tobacco. She reports that she does not drink alcohol and does not use drugs.   Family History:  The patient's family history includes Diabetes in her brother, father, and paternal grandmother; Heart attack in her brother, father, and mother; Heart disease in her brother, father, and mother; Hypertension in her father and mother; Kidney failure in her brother and father; Stroke in her father and mother.  ROS:  Please see the history of present illness.  All other systems are reviewed and otherwise negative.   PHYSICAL EXAM:  VS:  There were no vitals taken for this visit. BMI: There is no height or weight on file to calculate BMI. Quite thin body habitus in no acute distress  HEENT: normocephalic, atraumatic  Neck:  no JVD, carotid bruits or masses Cardiac: RRR; no significant murmurs, no rubs, or gallops Lungs: CTA b/l, no wheezing, rhonchi or rales  Abd: soft, nontender MS: no deformity or atrophy Ext: no edema  Skin: warm and dry, no rash Neuro:  No gross deficits appreciated Psych: euthymic mood, full affect  ICD site is stable, no tethering or discomfort, skin is intact, no thinning/erosion   EKG:  not done today   ICD interrogation done today and reviewed by myself:  Battery remains approx 2 mo to ERI Lead measurements stab;e No new arrhythmias  EGMs from 12/11 are reviewed with Dr. Ladona Ridgel   10/20/23: TTE 1. Left ventricular ejection fraction, by estimation, is 45 to 50%. The  left ventricle has mildly decreased function. The left ventricle  demonstrates global hypokinesis. Left ventricular diastolic parameters are  indeterminate.   2. Right ventricular systolic function is low normal. The right  ventricular size is normal. Tricuspid regurgitation signal is inadequate  for assessing PA pressure.   3. Right atrial size was mild to moderately dilated.   4. The mitral valve is grossly normal. Mild mitral valve regurgitation.  No evidence of mitral stenosis.   5. The aortic valve is tricuspid. There is mild calcification of the  aortic valve. Aortic valve regurgitation is not visualized. No aortic  stenosis is present.   6. The pulmonic valve was abnormal.   7. The inferior vena cava is normal in size with greater than 50%  respiratory variability, suggesting right atrial pressure of 3 mmHg.     09/29/22: stress myoview The study shows normal myocardial perfusion.  The study is low risk.   No ST deviation was noted.   LV perfusion is normal.   Left ventricular function is abnormal. Global function is mildly reduced. Nuclear stress EF: 52 %. The left ventricular ejection fraction is mildly decreased (45-54%). End diastolic cavity size is normal. End systolic cavity size is  normal.  09/29/22: TTE 1. Left ventricular ejection fraction, by estimation, is 45 to 50%. Left  ventricular ejection fraction by 2D MOD biplane is 48.4 %. The left  ventricle has mildly decreased function. The left ventricle has no  regional wall motion abnormalities. Left  ventricular diastolic parameters are consistent with Grade I diastolic  dysfunction (impaired relaxation).   2. Right ventricular systolic function is mildly reduced. The right  ventricular size is normal. There is normal pulmonary artery systolic  pressure. The estimated right ventricular systolic pressure is 21.8 mmHg.   3. The mitral valve is abnormal. Mild mitral valve regurgitation.   4. The aortic valve is tricuspid. Aortic valve regurgitation is not  visualized.   5. The inferior vena cava is normal in size with greater than 50%  respiratory variability, suggesting right atrial pressure of 3 mmHg.   Comparison(s): 01/24/20 EF 50-55%. GLS -15.9%.    LEFT HEART CATH AND CORS/GRAFTS ANGIOGRAPHY 08/04/2018  Conclusion      Prox RCA lesion is 100% stenosed. SVG graft was visualized by angiography and is normal in caliber. Ost 1st Diag lesion is 100% stenosed. SVG graft was visualized by angiography and is normal in caliber. Prox LAD to Mid LAD lesion is 90% stenosed. Mid LM to Dist LM lesion is 50% stenosed. Mid Cx to Dist Cx lesion is 60% stenosed. Ost 2nd Mrg lesion is 50% stenosed.   1. Severe native vessel CAD s/p 3V CABG with 3/3 patent grafts 2. Mild distal left main stenosis 3. Severe mid LAD stenosis. Antegrade flow and retrograde flow in the mid and distal LAD. The LIMA to the mid LAD is patent. The Diagonal is occluded. The SVG to the Diagonal is patent.  4. The Circumflex is a large caliber artery with mild plaque in the mid segment. The distal AV groove Circumflex has chronic diffuse stenosis 5. The RCA is chronically occluded in the proximal segment. The SVG to the PDA is patent.     Recommendations: Continue medical management of CAD    Echo 12/2015 Study Conclusions   - Left ventricle: The cavity size was normal. Systolic function was   mildly reduced. The estimated ejection fraction was in the range   of 45% to 50%. Diffuse hypokinesis. Features are consistent with   a pseudonormal left ventricular filling pattern, with concomitant   abnormal relaxation and increased filling pressure (grade 2   diastolic dysfunction). - Mitral valve: There was moderate regurgitation. - Left atrium: The atrium was moderately dilated. - Right ventricle: Poorly visualized. - Tricuspid valve: There was mild regurgitation.     Recent Labs: 10/19/2023: B Natriuretic Peptide 22.2; TSH 1.731 10/21/2023: ALT 19; BUN 36; Creatinine, Ser 2.69; Hemoglobin 12.3; Magnesium 2.1; Platelets 185; Potassium 4.2; Sodium 138  No results found for requested labs within last 365 days.   Estimated Creatinine Clearance: 15.7 mL/min (A) (by C-G formula based on SCr of 2.69 mg/dL (H)).   Wt Readings from Last 3 Encounters:  10/20/23 112 lb 14 oz (51.2 kg)  09/20/23 116 lb 6.5 oz (52.8 kg)  09/06/23 116 lb 6.5 oz (52.8 kg)     Other studies reviewed: Additional studies/records reviewed  today include: summarized above  ASSESSMENT AND PLAN:   1. ICD     Intact function     no programming changes made     near ERI  2. ICM 3. chronic CHF (systolic)     Last LVEF 45-50%, 2024, stable      No symptoms or exam findings to suggest volume OL     optiVol looks very good     She is on BB/ARB, diuteric therapy     C/w Dr. Valetta Mole   4. CAD     h/o CABG     Medical tx by last cath in 2019     On plavix, BB, statin/zetia, nitrate      C/w Dr. Valetta Mole   5. HTN     No changes   6. Tachycardia/palpitations In Dr. Lubertha Basque review also suspects mostly likely AVNRT (can not r/o VT, though felt less likely) D/w the patient today recommended EPS/ablation if SVT Discussed the procedure,  potential risks/benefits, she would like to proceed   7. Tremulous Sinus tachycardia today 100's-110s Normal TSH in the hospital She is emotional/grieving and perhaps driving this Advised she see her PMD for evaluation and help with management     Disposition: planned for EPS/ablation, usual follow up afterwards, she will need a gen change early next year (76mo to ERI)  Current medicines are reviewed at length with the patient today.  The patient did not have any concerns regarding medicines.  Norma Fredrickson, PA-C 10/23/2023 9:26 AM     [facility] [address] [place] [address] [phone] (office)  [phone] (fax)

## 2023-10-24 ENCOUNTER — Ambulatory Visit: Payer: Medicare HMO | Admitting: Physician Assistant

## 2023-10-24 LAB — CULTURE, BLOOD (ROUTINE X 2): Culture: NO GROWTH

## 2023-10-25 ENCOUNTER — Ambulatory Visit: Payer: Medicare HMO | Attending: Physician Assistant | Admitting: Physician Assistant

## 2023-10-25 ENCOUNTER — Encounter: Payer: Self-pay | Admitting: Physician Assistant

## 2023-10-25 VITALS — BP 116/78 | HR 112 | Ht 63.0 in | Wt 113.0 lb

## 2023-10-25 DIAGNOSIS — I471 Supraventricular tachycardia, unspecified: Secondary | ICD-10-CM | POA: Diagnosis not present

## 2023-10-25 DIAGNOSIS — I255 Ischemic cardiomyopathy: Secondary | ICD-10-CM | POA: Diagnosis not present

## 2023-10-25 DIAGNOSIS — Z95810 Presence of automatic (implantable) cardiac defibrillator: Secondary | ICD-10-CM | POA: Diagnosis not present

## 2023-10-25 DIAGNOSIS — I2510 Atherosclerotic heart disease of native coronary artery without angina pectoris: Secondary | ICD-10-CM | POA: Diagnosis not present

## 2023-10-25 DIAGNOSIS — I10 Essential (primary) hypertension: Secondary | ICD-10-CM | POA: Diagnosis not present

## 2023-10-25 LAB — CUP PACEART INCLINIC DEVICE CHECK
Battery Remaining Longevity: 2 mo
Battery Voltage: 2.8 V
Brady Statistic AP VP Percent: 0.01 %
Brady Statistic AP VS Percent: 0.03 %
Brady Statistic AS VP Percent: 98.53 %
Brady Statistic AS VS Percent: 1.42 %
Brady Statistic RA Percent Paced: 0.05 %
Brady Statistic RV Percent Paced: 7.02 %
Date Time Interrogation Session: 20241217174858
HighPow Impedance: 38 Ohm
HighPow Impedance: 54 Ohm
Implantable Lead Connection Status: 753985
Implantable Lead Connection Status: 753985
Implantable Lead Connection Status: 753985
Implantable Lead Implant Date: 20090610
Implantable Lead Implant Date: 20090610
Implantable Lead Implant Date: 20090610
Implantable Lead Location: 753858
Implantable Lead Location: 753859
Implantable Lead Location: 753860
Implantable Lead Model: 4194
Implantable Lead Model: 5076
Implantable Lead Model: 6947
Implantable Pulse Generator Implant Date: 20160323
Lead Channel Impedance Value: 285 Ohm
Lead Channel Impedance Value: 342 Ohm
Lead Channel Impedance Value: 475 Ohm
Lead Channel Impedance Value: 475 Ohm
Lead Channel Impedance Value: 532 Ohm
Lead Channel Impedance Value: 665 Ohm
Lead Channel Pacing Threshold Amplitude: 0.625 V
Lead Channel Pacing Threshold Amplitude: 0.75 V
Lead Channel Pacing Threshold Amplitude: 1.125 V
Lead Channel Pacing Threshold Pulse Width: 0.4 ms
Lead Channel Pacing Threshold Pulse Width: 0.4 ms
Lead Channel Pacing Threshold Pulse Width: 0.4 ms
Lead Channel Sensing Intrinsic Amplitude: 2.25 mV
Lead Channel Sensing Intrinsic Amplitude: 3 mV
Lead Channel Sensing Intrinsic Amplitude: 5.75 mV
Lead Channel Sensing Intrinsic Amplitude: 6 mV
Lead Channel Setting Pacing Amplitude: 1.5 V
Lead Channel Setting Pacing Amplitude: 2 V
Lead Channel Setting Pacing Amplitude: 2.25 V
Lead Channel Setting Pacing Pulse Width: 0.4 ms
Lead Channel Setting Pacing Pulse Width: 0.4 ms
Lead Channel Setting Sensing Sensitivity: 0.3 mV
Zone Setting Status: 755011
Zone Setting Status: 755011

## 2023-10-25 LAB — CULTURE, BLOOD (ROUTINE X 2): Culture: NO GROWTH

## 2023-10-25 NOTE — Patient Instructions (Addendum)
Medication Instructions:   Your physician recommends that you continue on your current medications as directed. Please refer to the Current Medication list given to you today.   *If you need a refill on your cardiac medications before your next appointment, please call your pharmacy*   Lab Work: NONE ORDERED  TODAY    If you have labs (blood work) drawn today and your tests are completely normal, you will receive your results only by: MyChart Message (if you have MyChart) OR A paper copy in the mail If you have any lab test that is abnormal or we need to change your treatment, we will call you to review the results.   Testing/Procedures: Your physician has recommended that you have an ablation. Catheter ablation is a medical procedure used to treat some cardiac arrhythmias (irregular heartbeats). During catheter ablation, a long, thin, flexible tube is put into a blood vessel in your groin (upper thigh), or neck. This tube is called an ablation catheter. It is then guided to your heart through the blood vessel. Radio frequency waves destroy small areas of heart tissue where abnormal heartbeats may cause an arrhythmia to start. Please see the instruction sheet given to you today.    Follow-Up: At [facility], you and your health needs are our priority.  As part of our continuing mission to provide you with exceptional heart care, we have created designated Provider Care Teams.  These Care Teams include your primary Cardiologist (physician) and Advanced Practice Providers (APPs -  Physician Assistants and Nurse Practitioners) who all work together to provide you with the care you need, when you need it.  We recommend signing up for the patient portal called "MyChart".  Sign up information is provided on this After Visit Summary.  MyChart is used to connect with patients for Virtual Visits (Telemedicine).  Patients are able to view lab/test results, encounter notes, upcoming  appointments, etc.  Non-urgent messages can be sent to your provider as well.   To learn more about what you can do with MyChart, go to [email or web address].     Your next appointment:  3 MONTHS AFTER ABLATION ( CONTACT  CASSIE HALL/ ANGELINE HAMMER FOR EP SCHEDULING ISSUES )    Provider:    You may see Dr. Ladona Ridgel  or one of the following Advanced Practice Providers on your designated Care Team:   Francis Dowse, [place] Casimiro Needle "Mardelle Matte" [place], [place] Canary Brim, NP   Other Instructions

## 2023-10-27 ENCOUNTER — Ambulatory Visit (INDEPENDENT_AMBULATORY_CARE_PROVIDER_SITE_OTHER): Payer: Medicare HMO

## 2023-10-27 ENCOUNTER — Encounter (HOSPITAL_COMMUNITY): Payer: Self-pay

## 2023-10-27 DIAGNOSIS — I255 Ischemic cardiomyopathy: Secondary | ICD-10-CM

## 2023-10-27 LAB — CUP PACEART REMOTE DEVICE CHECK
Battery Remaining Longevity: 2 mo
Battery Voltage: 2.8 V
Brady Statistic AP VP Percent: 0 %
Brady Statistic AP VS Percent: 0 %
Brady Statistic AS VP Percent: 98.52 %
Brady Statistic AS VS Percent: 1.47 %
Brady Statistic RA Percent Paced: 0.01 %
Brady Statistic RV Percent Paced: 50.41 %
Date Time Interrogation Session: 20241219032204
HighPow Impedance: 36 Ohm
HighPow Impedance: 47 Ohm
Implantable Lead Connection Status: 753985
Implantable Lead Connection Status: 753985
Implantable Lead Connection Status: 753985
Implantable Lead Implant Date: 20090610
Implantable Lead Implant Date: 20090610
Implantable Lead Implant Date: 20090610
Implantable Lead Location: 753858
Implantable Lead Location: 753859
Implantable Lead Location: 753860
Implantable Lead Model: 4194
Implantable Lead Model: 5076
Implantable Lead Model: 6947
Implantable Pulse Generator Implant Date: 20160323
Lead Channel Impedance Value: 247 Ohm
Lead Channel Impedance Value: 399 Ohm
Lead Channel Impedance Value: 456 Ohm
Lead Channel Impedance Value: 475 Ohm
Lead Channel Impedance Value: 513 Ohm
Lead Channel Impedance Value: 646 Ohm
Lead Channel Pacing Threshold Amplitude: 0.625 V
Lead Channel Pacing Threshold Amplitude: 0.75 V
Lead Channel Pacing Threshold Amplitude: 1.125 V
Lead Channel Pacing Threshold Pulse Width: 0.4 ms
Lead Channel Pacing Threshold Pulse Width: 0.4 ms
Lead Channel Pacing Threshold Pulse Width: 0.4 ms
Lead Channel Sensing Intrinsic Amplitude: 1.875 mV
Lead Channel Sensing Intrinsic Amplitude: 1.875 mV
Lead Channel Sensing Intrinsic Amplitude: 6.25 mV
Lead Channel Sensing Intrinsic Amplitude: 6.25 mV
Lead Channel Setting Pacing Amplitude: 1.5 V
Lead Channel Setting Pacing Amplitude: 2 V
Lead Channel Setting Pacing Amplitude: 2.25 V
Lead Channel Setting Pacing Pulse Width: 0.4 ms
Lead Channel Setting Pacing Pulse Width: 0.4 ms
Lead Channel Setting Sensing Sensitivity: 0.3 mV
Zone Setting Status: 755011
Zone Setting Status: 755011

## 2023-10-31 DIAGNOSIS — N184 Chronic kidney disease, stage 4 (severe): Secondary | ICD-10-CM | POA: Diagnosis not present

## 2023-10-31 DIAGNOSIS — N390 Urinary tract infection, site not specified: Secondary | ICD-10-CM | POA: Diagnosis not present

## 2023-11-03 NOTE — Pre-Procedure Instructions (Signed)
Instructed patient on the following items: Arrival time 0515 Nothing to eat or drink after midnight No meds AM of procedure Responsible person to drive you home and stay with you for 24 hrs     

## 2023-11-04 ENCOUNTER — Ambulatory Visit (HOSPITAL_COMMUNITY)
Admission: RE | Admit: 2023-11-04 | Discharge: 2023-11-05 | Disposition: A | Discharge status: Home / Self Care | Payer: Medicare HMO | Attending: Internal Medicine | Admitting: Internal Medicine

## 2023-11-04 ENCOUNTER — Other Ambulatory Visit: Payer: Self-pay

## 2023-11-04 ENCOUNTER — Encounter (HOSPITAL_COMMUNITY)
Admission: RE | Disposition: A | Discharge status: Home / Self Care | Payer: Self-pay | Source: Home / Self Care | Attending: Internal Medicine

## 2023-11-04 ENCOUNTER — Encounter (HOSPITAL_COMMUNITY): Payer: Self-pay | Admitting: Internal Medicine

## 2023-11-04 DIAGNOSIS — I25119 Atherosclerotic heart disease of native coronary artery with unspecified angina pectoris: Secondary | ICD-10-CM | POA: Diagnosis not present

## 2023-11-04 DIAGNOSIS — N184 Chronic kidney disease, stage 4 (severe): Secondary | ICD-10-CM | POA: Diagnosis not present

## 2023-11-04 DIAGNOSIS — Z8673 Personal history of transient ischemic attack (TIA), and cerebral infarction without residual deficits: Secondary | ICD-10-CM | POA: Insufficient documentation

## 2023-11-04 DIAGNOSIS — Z7902 Long term (current) use of antithrombotics/antiplatelets: Secondary | ICD-10-CM | POA: Diagnosis not present

## 2023-11-04 DIAGNOSIS — E785 Hyperlipidemia, unspecified: Secondary | ICD-10-CM | POA: Diagnosis not present

## 2023-11-04 DIAGNOSIS — Z7982 Long term (current) use of aspirin: Secondary | ICD-10-CM | POA: Diagnosis not present

## 2023-11-04 DIAGNOSIS — I255 Ischemic cardiomyopathy: Secondary | ICD-10-CM | POA: Diagnosis not present

## 2023-11-04 DIAGNOSIS — I130 Hypertensive heart and chronic kidney disease with heart failure and stage 1 through stage 4 chronic kidney disease, or unspecified chronic kidney disease: Secondary | ICD-10-CM | POA: Insufficient documentation

## 2023-11-04 DIAGNOSIS — I471 Supraventricular tachycardia, unspecified: Secondary | ICD-10-CM | POA: Diagnosis not present

## 2023-11-04 DIAGNOSIS — I5022 Chronic systolic (congestive) heart failure: Secondary | ICD-10-CM | POA: Insufficient documentation

## 2023-11-04 DIAGNOSIS — E1122 Type 2 diabetes mellitus with diabetic chronic kidney disease: Secondary | ICD-10-CM | POA: Diagnosis not present

## 2023-11-04 DIAGNOSIS — E119 Type 2 diabetes mellitus without complications: Secondary | ICD-10-CM

## 2023-11-04 DIAGNOSIS — Z951 Presence of aortocoronary bypass graft: Secondary | ICD-10-CM | POA: Diagnosis not present

## 2023-11-04 DIAGNOSIS — Z95810 Presence of automatic (implantable) cardiac defibrillator: Secondary | ICD-10-CM | POA: Diagnosis not present

## 2023-11-04 DIAGNOSIS — I252 Old myocardial infarction: Secondary | ICD-10-CM | POA: Insufficient documentation

## 2023-11-04 DIAGNOSIS — Z7985 Long-term (current) use of injectable non-insulin antidiabetic drugs: Secondary | ICD-10-CM | POA: Insufficient documentation

## 2023-11-04 DIAGNOSIS — I10 Essential (primary) hypertension: Secondary | ICD-10-CM | POA: Diagnosis present

## 2023-11-04 DIAGNOSIS — Z79899 Other long term (current) drug therapy: Secondary | ICD-10-CM | POA: Diagnosis not present

## 2023-11-04 DIAGNOSIS — I25709 Atherosclerosis of coronary artery bypass graft(s), unspecified, with unspecified angina pectoris: Secondary | ICD-10-CM | POA: Diagnosis present

## 2023-11-04 HISTORY — PX: SVT ABLATION: EP1225

## 2023-11-04 LAB — GLUCOSE, CAPILLARY
Glucose-Capillary: 110 mg/dL — ABNORMAL HIGH (ref 70–99)
Glucose-Capillary: 98 mg/dL (ref 70–99)
Glucose-Capillary: 101 mg/dL — ABNORMAL HIGH (ref 70–99)
Glucose-Capillary: 137 mg/dL — ABNORMAL HIGH (ref 70–99)
Glucose-Capillary: 98 mg/dL (ref 70–99)

## 2023-11-04 SURGERY — SVT ABLATION

## 2023-11-04 MED ORDER — VENLAFAXINE HCL 50 MG PO TABS
100.0000 mg | ORAL_TABLET | Freq: Two times a day (BID) | ORAL | Status: DC
  Filled 2023-11-04: qty 2

## 2023-11-04 MED ORDER — SODIUM CHLORIDE 0.9 % IV SOLN: INTRAVENOUS | Status: DC

## 2023-11-04 MED ORDER — VENLAFAXINE HCL 50 MG PO TABS
100.0000 mg | ORAL_TABLET | Freq: Two times a day (BID) | ORAL | Status: DC
  Administered 2023-11-04 – 2023-11-05 (×2): 100 mg via ORAL
  Filled 2023-11-04 (×3): qty 2

## 2023-11-04 MED ORDER — MIDAZOLAM HCL 5 MG/5ML IJ SOLN
INTRAMUSCULAR | Status: AC
Start: 2023-11-04 — End: ?
  Filled 2023-11-04: qty 5

## 2023-11-04 MED ORDER — VENLAFAXINE HCL 50 MG PO TABS: 100.0000 mg | ORAL_TABLET | ORAL | Status: DC

## 2023-11-04 MED ORDER — SODIUM CHLORIDE 0.9 % IV SOLN: 250.0000 mL | INTRAVENOUS | Status: AC | PRN

## 2023-11-04 MED ORDER — MIDAZOLAM HCL 5 MG/5ML IJ SOLN
INTRAMUSCULAR | Status: DC | PRN
  Administered 2023-11-04: 1 mg via INTRAVENOUS
  Administered 2023-11-04: 2 mg via INTRAVENOUS
  Administered 2023-11-04 (×3): 1 mg via INTRAVENOUS

## 2023-11-04 MED ORDER — ACETAMINOPHEN 325 MG PO TABS
650.0000 mg | ORAL_TABLET | Freq: Four times a day (QID) | ORAL | Status: DC | PRN
  Administered 2023-11-04: 650 mg via ORAL
  Filled 2023-11-04: qty 2

## 2023-11-04 MED ORDER — BUPIVACAINE HCL (PF) 0.25 % IJ SOLN
INTRAMUSCULAR | Status: AC
Start: 2023-11-04 — End: ?
  Filled 2023-11-04: qty 60

## 2023-11-04 MED ORDER — SIMVASTATIN 20 MG PO TABS
40.0000 mg | ORAL_TABLET | Freq: Every day | ORAL | Status: DC
  Administered 2023-11-04: 40 mg via ORAL
  Filled 2023-11-04: qty 2

## 2023-11-04 MED ORDER — BUPIVACAINE HCL (PF) 0.25 % IJ SOLN
INTRAMUSCULAR | Status: DC | PRN
  Administered 2023-11-04: 60 mL

## 2023-11-04 MED ORDER — FENTANYL CITRATE (PF) 100 MCG/2ML IJ SOLN
INTRAMUSCULAR | Status: AC
  Filled 2023-11-04: qty 2

## 2023-11-04 MED ORDER — ATROPINE SULFATE 1 MG/10ML IJ SOSY
PREFILLED_SYRINGE | INTRAMUSCULAR | Status: AC
  Filled 2023-11-04: qty 10

## 2023-11-04 MED ORDER — FENTANYL CITRATE PF 50 MCG/ML IJ SOSY: 25.0000 ug | PREFILLED_SYRINGE | INTRAMUSCULAR | Status: DC | PRN

## 2023-11-04 MED ORDER — SODIUM CHLORIDE 0.9 % IV SOLN
INTRAVENOUS | Status: DC | PRN
  Administered 2023-11-04: 4 ug/min via INTRAVENOUS

## 2023-11-04 MED ORDER — ISOPROTERENOL HCL 0.2 MG/ML IJ SOLN
INTRAMUSCULAR | Status: AC
Start: 2023-11-04 — End: ?
  Filled 2023-11-04: qty 5

## 2023-11-04 MED ORDER — SODIUM CHLORIDE 0.9% FLUSH: 3.0000 mL | INTRAVENOUS | Status: DC | PRN

## 2023-11-04 MED ORDER — SODIUM CHLORIDE 0.9% FLUSH
3.0000 mL | Freq: Two times a day (BID) | INTRAVENOUS | Status: DC
  Administered 2023-11-04 – 2023-11-05 (×2): 3 mL via INTRAVENOUS

## 2023-11-04 MED ORDER — FENTANYL CITRATE (PF) 100 MCG/2ML IJ SOLN
INTRAMUSCULAR | Status: DC | PRN
  Administered 2023-11-04: 25 ug via INTRAVENOUS
  Administered 2023-11-04 (×4): 12.5 ug via INTRAVENOUS

## 2023-11-04 MED ORDER — HEPARIN (PORCINE) IN NACL 1000-0.9 UT/500ML-% IV SOLN
INTRAVENOUS | Status: DC | PRN
  Administered 2023-11-04: 500 mL

## 2023-11-04 MED ORDER — ALPRAZOLAM 0.5 MG PO TABS
1.0000 mg | ORAL_TABLET | Freq: Three times a day (TID) | ORAL | Status: DC | PRN
  Administered 2023-11-04 – 2023-11-05 (×2): 1 mg via ORAL
  Filled 2023-11-04 (×2): qty 2

## 2023-11-04 MED ORDER — EZETIMIBE 10 MG PO TABS
10.0000 mg | ORAL_TABLET | Freq: Every day | ORAL | Status: DC
  Administered 2023-11-04 – 2023-11-05 (×2): 10 mg via ORAL
  Filled 2023-11-04 (×2): qty 1

## 2023-11-04 MED ORDER — ONDANSETRON HCL 4 MG/2ML IJ SOLN: 4.0000 mg | Freq: Four times a day (QID) | INTRAMUSCULAR | Status: DC | PRN

## 2023-11-04 SURGICAL SUPPLY — 9 items
CATH EZ STEER NAV 4MM D-F CUR (ABLATOR) IMPLANT
CATH JOSEPH QUAD ALLRED 6F REP (CATHETERS) IMPLANT
CATH POLARIS X 2.5/5/2.5 DECAP (CATHETERS) IMPLANT
PACK EP LF (CUSTOM PROCEDURE TRAY) ×1 IMPLANT
PAD DEFIB RADIO PHYSIO CONN (PAD) ×1 IMPLANT
PATCH CARTO3 (PAD) IMPLANT
SHEATH PINNACLE 6F 10CM (SHEATH) IMPLANT
SHEATH PINNACLE 7F 10CM (SHEATH) IMPLANT
SHEATH PINNACLE 8F 10CM (SHEATH) IMPLANT

## 2023-11-04 NOTE — Discharge Instructions (Signed)

## 2023-11-04 NOTE — Progress Notes (Signed)
Sheaths pulled at 1026 Vital signs checked and stable before pull. Sheaths were aspirated and removed Gus Rankin, RN and pressure was held. Vitals were checked every 5 minutes. Site covered with Tegaderm and gauze. No hematoma and site is a level zero. Distal pulse palpable. Vital signs remained stable post pull. Education was given to patient and bedrest started at 1040.

## 2023-11-04 NOTE — H&P (Signed)
HPI [name] returns today for followup. She is a pleasant 72 yo woman with CAD, s/p MI, with an ICM, s/p Biv ICD insertion. She has had trouble with weight gain and then was placed on Ozempic. She has lost almost 40 lbs. She feels well. She appears much younger than her stated age. She also notes stage 4 renal failure.    She presents for ep study and catheter ablation after experiencing recurrent SVT several weeks ago where she was found to have SVT at 170/min. She has a h/o prior SVT ablation.  Allergies       Allergies  Allergen Reactions   Calcium Channel Blockers Other (See Comments)      Cannot take non-DHP CCBs (diltiazem, verapamil) due to CHF requiring ICD   Codeine Anaphylaxis and Other (See Comments)   Lyrica [Pregabalin]        Nightmares, and swelling   Other Anaphylaxis and Other (See Comments)      Walnuts, pecans, and ANY melons PATIENT IS A VEGETARIAN     Ramipril Anaphylaxis, Swelling and Other (See Comments)   Ticlid [Ticlopidine Hcl] Other (See Comments)      Unprovoked bleeding while on Ticlid (doesn't think she was on ASA at the time) Takes Plavix without problems   Morphine And Related Other (See Comments)      Severe AMS, confusion, weakness Tolerates Fentanyl, Tramadol   Tape Dermatitis and Rash      Tolerates paper tape   Digoxin Other (See Comments)   Digoxin And Related Nausea Only      Unknown   Diltiazem Hcl Other (See Comments)   Diltiazem Hcl Er Beads Other (See Comments)   Metformin Hcl Other (See Comments)   Metolazone Other (See Comments)      Cannot remember reaction   Milk-Related Compounds Other (See Comments)      Bad gas   Morphine Sulfate Other (See Comments)   Myrbetriq [Mirabegron] Other (See Comments)      Aggravates migraines   Nsaids Other (See Comments)   Onglyza [Saxagliptin] Other (See Comments)      Exacerbates migraines   Penicillin V Other (See Comments)   Penicillins Hives      Has patient had a PCN  reaction causing immediate rash, facial/tongue/throat swelling, SOB or lightheadedness with hypotension: Yes Has patient had a PCN reaction causing severe rash involving mucus membranes or skin necrosis: Unknown Has patient had a PCN reaction that required hospitalization: Unknown Has patient had a PCN reaction occurring within the last 10 years: No If all of the above answers are "NO", then may proceed with Cephalosporin use.     Spironolactone Other (See Comments)      Cannot remember reaction   Sulfa Antibiotics Hives and Other (See Comments)   Tetracycline Hcl Other (See Comments)   Tetracyclines & Related Other (See Comments)      Cannot remember reaction Tolerates macrolides (azithromycin)   Ticlopidine Other (See Comments)   Verapamil Other (See Comments)      Pacemaker/CHF   Vicodin [Hydrocodone-Acetaminophen] Other (See Comments)      EXTREME LETHARGY   Latex Itching, Rash and Other (See Comments)                Current Outpatient Medications  Medication Sig Dispense Refill   albuterol (PROVENTIL HFA;VENTOLIN HFA) 108 (90 BASE) MCG/ACT inhaler Inhale 2 puffs into the lungs every 4 (four) hours as needed for wheezing or shortness  of breath.       Alcohol Swabs (B-D SINGLE USE SWABS REGULAR) PADS         ALPRAZolam (XANAX) 1 MG tablet Take 1 mg by mouth 3 (three) times daily.    2   Artificial Tear GEL Place 2 drops into both eyes daily as needed (dry eyes).        aspirin 325 MG EC tablet Take 325 mg by mouth daily.       benzonatate (TESSALON) 100 MG capsule Take 1 capsule (100 mg total) by mouth 3 (three) times daily as needed for cough. 30 capsule 0   BIOTIN PO Take by mouth daily.       cetirizine (ZYRTEC) 10 MG tablet Take 10 mg by mouth daily.       Cholecalciferol (VITAMIN D-3) 1000 UNITS CAPS Take 1,000 Units by mouth daily.        clopidogrel (PLAVIX) 75 MG tablet TAKE 1 TABLET BY MOUTH  DAILY 90 tablet 1   cyclobenzaprine (FLEXERIL) 5 MG tablet Take 1 tablet (5  mg total) by mouth at bedtime as needed for muscle spasms. 30 tablet 0   docusate sodium (COLACE) 250 MG capsule Take 250 mg by mouth daily.       doxazosin (CARDURA) 4 MG tablet Take 4 mg by mouth at bedtime.        DROPLET PEN NEEDLES 32G X 4 MM MISC         EPINEPHrine 0.3 mg/0.3 mL IJ SOAJ injection Inject 0.3 mg into the muscle as needed for anaphylaxis.    3   ezetimibe (ZETIA) 10 MG tablet TAKE 1 TABLET BY MOUTH  DAILY 90 tablet 1   Flaxseed, Linseed, (FLAXSEED OIL) 1200 MG CAPS Take 1,200 mg by mouth daily.       fluticasone (FLONASE) 50 MCG/ACT nasal spray Place 2 sprays into both nostrils daily as needed for allergies or rhinitis.       furosemide (LASIX) 80 MG tablet TAKE 1 TABLET TWICE A DAY. MAY TAKE AN ADDITIONAL 80MG  (1 TABLET) AS NEEDED FOR EDEMA 270 tablet 1   hydrALAZINE (APRESOLINE) 25 MG tablet TAKE 1 TABLET BY MOUTH  TWICE DAILY 180 tablet 3   isosorbide mononitrate (IMDUR) 60 MG 24 hr tablet TAKE 1 TABLET BY MOUTH  DAILY 90 tablet 3   Ketotifen Fumarate (ALAWAY OP) Place 1 drop into both eyes daily as needed (allergies).        LANTUS SOLOSTAR 100 UNIT/ML Solostar Pen Inject 6 Units into the skin at bedtime.        metoprolol succinate (TOPROL-XL) 100 MG 24 hr tablet TAKE 2 TABLETS BY MOUTH  DAILY TAKE WITH OR  IMMEDIATELY FOLLOWING A  MEAL 180 tablet 3   Multiple Vitamin (MULTIVITAMIN WITH MINERALS) TABS Take 1 tablet by mouth daily.       nitroGLYCERIN (NITROSTAT) 0.4 MG SL tablet Place 1 tablet under the tongue every 5 minutes as needed for chest pain, max 3 doses, go to er if no relief 25 tablet 2   olmesartan (BENICAR) 40 MG tablet TAKE 1 TABLET BY MOUTH  DAILY 90 tablet 3   ondansetron (ZOFRAN) 8 MG tablet Take 8 mg by mouth every 8 (eight) hours as needed for nausea or vomiting.        OZEMPIC, 0.25 OR 0.5 MG/DOSE, 2 MG/1.5ML SOPN Inject 0.25 mg into the skin once a week. Sunday       pantoprazole (PROTONIX) 40 MG tablet TAKE 1 TABLET  BY MOUTH  DAILY 90 tablet 3    potassium chloride (KLOR-CON M) 10 MEQ tablet TAKE 2 TABLETS BY MOUTH  TWICE DAILY 360 tablet 1   simvastatin (ZOCOR) 40 MG tablet TAKE 1 TABLET BY MOUTH AT  BEDTIME 90 tablet 3   sodium chloride (MURO 128) 5 % ophthalmic ointment Place 1 drop into both eyes at bedtime. For dry eyes       traMADol (ULTRAM) 50 MG tablet Take 100 mg by mouth every 6 (six) hours as needed (MIGRAINES).       TRUE METRIX BLOOD GLUCOSE TEST test strip         TRUEplus Lancets 33G MISC         venlafaxine (EFFEXOR) 100 MG tablet Take 100-200 mg by mouth See admin instructions. TAKE 200 mg by mouth in the morning and take 100 mg at noon       vitamin A 7500 UNIT capsule Take 2,400 Units by mouth daily.          No current facility-administered medications for this visit.              Past Medical History:  Diagnosis Date   AICD (automatic cardioverter/defibrillator) present      [employer]- Dr. Rosette Reveal follows   Anginal pain (HCC)     Anxiety     Asthma     Back pain      "pinched nerve-lower back" - Dr. Ethelene Hal follows.   Biventricular implantable cardioverter-defibrillator in situ 06/18/2011   Cerebral infarction (HCC) 11/11/2012   Cervical dysplasia     Chronic diastolic heart failure (HCC) 03/22/2016   Chronic kidney disease      Dr. Allena Katz follows.   Chronic renal insufficiency, stage III (moderate) (HCC) 11/11/2012   Complication of anesthesia      "I wake up during surgeries" (02/14/2013)   Coronary artery disease involving coronary bypass graft of native heart with angina pectoris (HCC) 06/18/2011   Depression     DM (diabetes mellitus) (HCC) 11/11/2012   Fibroid     Function kidney decreased     GERD (gastroesophageal reflux disease)     Hemiplegia, unspecified, affecting nondominant side 11/11/2012   Hepatitis      Hepatitis A -college yrs"water source exposure"   Hiatal hernia     History of shingles      2-3 yrs ago last out break "around waist"   History of stomach ulcers     Hyperlipidemia  07/30/2016   Hypertension     ICD (implantable cardiac defibrillator) in place     Iron deficiency anemia     Ischemic cardiomyopathy      status post biventricular ICD placed by DR Edumunds who used to see Dr Elsie Lincoln here to establish  cardiovascular care.   Migraines     MVP (mitral valve prolapse)      Antibiotics not required for procedures   Myocardial infarction (HCC)      "I've had 2; the others they were able to catch before completing" (02/14/2013)   Pacemaker     Paroxysmal SVT (supraventricular tachycardia) (HCC) 06/18/2011   Pneumonia 1950's & 1985   Shortness of breath      "lying down flat; at times w/exertion" (02/14/2013). 10-06-15 exertion only..   Stroke [facility])      "2 confirmed; 9 TIA's; results in dragging LLE; numbness in tip of tongue" (02/14/2013),10-06-15 right hand tends to be weaker when tired.   TIA (transient ischemic attack) 11/11/2012  Type II diabetes mellitus (HCC)            ROS:    All systems reviewed and negative except as noted in the HPI.          Past Surgical History:  Procedure Laterality Date   ABDOMINAL HYSTERECTOMY   1985    TAH    BIV ICD GENERTAOR CHANGE OUT   06/2007; 04/2008    "2 lead initial placement, at [facility]; done at [facility], after developing CHF" (02/14/2013)   BIV PACEMAKER GENERATOR CHANGE OUT N/A 01/29/2015    Procedure: BIV PACEMAKER GENERATOR CHANGE OUT;  Surgeon: Marinus Maw, MD;  Location: [facility] CATH LAB;  Service: Cardiovascular;  Laterality: N/A;   BREAST EXCISIONAL BIOPSY Left 01/2007; 06/2007; 03/2008    "benign" (02/14/2013)   BREAST SURGERY       CARDIAC CATHETERIZATION        "probably in the teens" (02/14/2013)   CARDIAC DEFIBRILLATOR PLACEMENT       COLONOSCOPY   ~ 2002   COLONOSCOPY WITH PROPOFOL N/A 10/07/2015    Procedure: COLONOSCOPY WITH PROPOFOL;  Surgeon: Charna Elizabeth, MD;  Location: [facility] ENDOSCOPY;  Service: Endoscopy;  Laterality: N/A;   CORONARY ANGIOPLASTY WITH STENT PLACEMENT        "started out w/5; bypass corrected  some; 1 stent since the bypass" (02/14/2013)   CORONARY ARTERY BYPASS GRAFT   ` 1998    LIMA-LAD, SVG-D1, SVG-PDA   DILATION AND CURETTAGE OF UTERUS   1975 X 2; 1976; 1977   INSERT / REPLACE / REMOVE PACEMAKER        biventricular defibrillator--06/10/ 2009   LEFT HEART CATH AND CORS/GRAFTS ANGIOGRAPHY N/A 08/04/2018    Procedure: LEFT HEART CATH AND CORS/GRAFTS ANGIOGRAPHY;  Surgeon: Kathleene Hazel, MD;  Location: [facility] INVASIVE CV LAB;  Service: Cardiovascular;  Laterality: N/A;   LEFT HEART CATHETERIZATION WITH CORONARY/GRAFT ANGIOGRAM N/A 01/03/2015    Procedure: LEFT HEART CATHETERIZATION WITH Isabel Caprice;  Surgeon: Lesleigh Noe, MD;  Location: [facility] CATH LAB;  Service: Cardiovascular;  Laterality: N/A;   SUPRAVENTRICULAR TACHYCARDIA ABLATION   06/2007   TEE WITHOUT CARDIOVERSION   11/14/2012    Procedure: TRANSESOPHAGEAL ECHOCARDIOGRAM (TEE);  Surgeon: Donato Schultz, MD;  Location: [facility] ENDOSCOPY;  Service: Cardiovascular;  Laterality: N/A;                 Family History  Problem Relation Age of Onset   Heart disease Mother     Hypertension Mother     Heart attack Mother     Stroke Mother     Heart disease Father     Hypertension Father     Diabetes Father     Kidney failure Father     Heart attack Father     Stroke Father     Heart disease Brother     Diabetes Brother     Kidney failure Brother     Heart attack Brother     Diabetes Paternal Grandmother              Social History         Socioeconomic History   Marital status: Married      Spouse name: [name]   Number of children: 1   Years of education: MA   Highest education level: Not on file  Occupational History      Employer: UNEMPLOYED      Comment: Disablity  Tobacco Use   Smoking status: Never   Smokeless  tobacco: Never  Vaping Use   Vaping Use: Never used  Substance and Sexual Activity   Alcohol use: No      Alcohol/week: 0.0 standard drinks   Drug use: No   Sexual activity: Yes       Birth control/protection: Surgical  Other Topics Concern   Not on file  Social History Narrative    Patient lives at home with spouse.      Daughter name is Programmer, multimedia.    Caffeine Use: none    Social Determinants of Manufacturing engineer Strain: Not on file  Food Insecurity: Not on file  Transportation Needs: Not on file  Physical Activity: Not on file  Stress: Not on file  Social Connections: Not on file  Intimate Partner Violence: Not on file        BP 110/66   Pulse 79   Ht 5\' 4"  (1.626 m)   Wt 129 lb 9.6 oz (58.8 kg)   SpO2 97%   BMI 22.25 kg/m    Physical Exam:   Well appearing NAD HEENT: Unremarkable Neck:  No JVD, no thyromegally Lymphatics:  No adenopathy Back:  No CVA tenderness Lungs:  Clear HEART:  Regular rate rhythm, no murmurs, no rubs, no clicks Abd:  soft, positive bowel sounds, no organomegally, no rebound, no guarding Ext:  2 plus pulses, no edema, no cyanosis, no clubbing Skin:  No rashes no nodules Neuro:  CN II through XII intact, motor grossly intact   EKG - nsr with biv pacing   DEVICE  Normal device function.  See PaceArt for details.    Assess/Plan:  Chronic systolic heart failure - her symptoms are class 2. She will continue her current meds. She will maintain a low sodium diet. ICD - her medtronic BIV ICD is working normally.  HTN - her bp is well controlled. She will maintain a low sodium diet. 4. Weight loss - she has lost almost 40 lbs. I encouraged her to keep her weight down. 5. Chest pain - Her symptoms are non-cardiac. She is tender on the medial side of her ICD but has no erythema, swelling or fluctuance. She will undergo watchful waiting. Discussed with Dr. Katrinka Blazing.   6. SVT - I have discussed the indications/risks/benefits/goals/expectations of EP study and catheter ablation and she wishes to proceed.   Sharlot Gowda Kayde Warehime,MD

## 2023-11-05 DIAGNOSIS — N184 Chronic kidney disease, stage 4 (severe): Secondary | ICD-10-CM | POA: Diagnosis not present

## 2023-11-05 DIAGNOSIS — I5022 Chronic systolic (congestive) heart failure: Secondary | ICD-10-CM | POA: Diagnosis not present

## 2023-11-05 DIAGNOSIS — I25119 Atherosclerotic heart disease of native coronary artery with unspecified angina pectoris: Secondary | ICD-10-CM | POA: Diagnosis not present

## 2023-11-05 DIAGNOSIS — E1122 Type 2 diabetes mellitus with diabetic chronic kidney disease: Secondary | ICD-10-CM | POA: Diagnosis not present

## 2023-11-05 DIAGNOSIS — I255 Ischemic cardiomyopathy: Secondary | ICD-10-CM | POA: Diagnosis not present

## 2023-11-05 DIAGNOSIS — E785 Hyperlipidemia, unspecified: Secondary | ICD-10-CM | POA: Diagnosis not present

## 2023-11-05 DIAGNOSIS — I130 Hypertensive heart and chronic kidney disease with heart failure and stage 1 through stage 4 chronic kidney disease, or unspecified chronic kidney disease: Secondary | ICD-10-CM | POA: Diagnosis not present

## 2023-11-05 DIAGNOSIS — Z95810 Presence of automatic (implantable) cardiac defibrillator: Secondary | ICD-10-CM | POA: Diagnosis not present

## 2023-11-05 DIAGNOSIS — I471 Supraventricular tachycardia, unspecified: Secondary | ICD-10-CM | POA: Diagnosis not present

## 2023-11-05 LAB — GLUCOSE, CAPILLARY: Glucose-Capillary: 87 mg/dL (ref 70–99)

## 2023-11-05 MED ORDER — ASPIRIN 81 MG PO TBEC: 325.0000 mg | DELAYED_RELEASE_TABLET | Freq: Every day | ORAL | 11 refills | Status: AC

## 2023-11-05 MED ORDER — ASPIRIN 81 MG PO TBEC: 81.0000 mg | DELAYED_RELEASE_TABLET | Freq: Every day | ORAL | 11 refills | Status: DC

## 2023-11-05 NOTE — Discharge Summary (Addendum)
Discharge Summary    Patient ID: [name] MRN: [id number]; DOB: 19-Nov-1950  Admit date: 11/04/2023 Discharge date: 11/05/2023  PCP:  Merri Brunette, MD   [facility] Providers Cardiologist:  Nanetta Batty, MD   {  Discharge Diagnoses    Principal Problem:   SVT (supraventricular tachycardia) [facility]) Active Problems:   Biventricular implantable cardioverter-defibrillator in situ   Coronary artery disease involving coronary bypass graft of native heart with angina pectoris (HCC)   HTN (hypertension)   DM (diabetes mellitus) (HCC)   Ischemic cardiomyopathy   Hyperlipidemia   CKD (chronic kidney disease) stage 4, GFR 15-29 ml/min (HCC)    Diagnostic Studies/Procedures    SVT ablation on 11/04/2023 . Sinus rhythm upon presentation.  2. The patient had dual AV nodal physiology with easily inducible classic AV nodal reentrant tachycardia, there were no other accessory pathways or arrhythmias induced  3. Successful radiofrequency ablation of the slow AV nodal pathway, no longer present after RFA  4. No inducible arrhythmias following ablation.  5. No early apparent complications.  _____________   History of Present Illness     [name] is a 72 y.o. female with  anxiety, asthma, CAD (CABG 1998), ICM, chronic CHF (systolic), ICD, HTN, HLD, stroke, DM, VHD (mod MR), CKD (IV), SVT (ablated AVNRT 2008)   Patient presented for EP study and catheter ablation with known history of recurrent SVT and recent admission, DC 10/21/2023  Hospital Course     Consultants:    SVT EP study and ablation as above.  No acute complications postoperatively.  Ischemic cardiomyopathy NYHA Class 2 well compensated without any symptoms.  Last echo in December 2024 showed mildly reduced EF 45 to 50% with low normal RV function. PTA olmesartan 40 mg, Lasix 80 mg twice daily, hydralazine 25 mg, Toprol-XL 100 mg, Imdur  Medtronic BiV ICD Normal device function, will  be getting change out soon.  CKD On renal transplant list.   CAD status post CABG Last catheterization was in 2019 revealing a patent LIMA to the LAD, patent vein to diagonal branch and patent vein to the PDA. She is asymptomatic.  PTA Plavix, aspirin 325mg , Zetia 10 mg, Imdur 60 mg, Toprol-XL 100 mg, simvastatin 40 mg  Patient seen and examined by Dr. Lalla Brothers and deemed stable for discharge.  I will send staff message to EP nursing to help arrange follow-up in 90 days.  No changes in medications, continue PTA meds. She's on aspirin 325mg  and plavix which she says has been on it for years and would like to continue this. Defer to MD if they want to continue this.     Did the patient have an acute coronary syndrome (MI, NSTEMI, STEMI, etc) this admission?:  No                               Did the patient have a percutaneous coronary intervention (stent / angioplasty)?:  No.    ____________  Discharge Vitals Blood pressure (!) 145/89, pulse 76, temperature 98.5 F (36.9 C), temperature source Oral, resp. rate 16, height 5\' 3"  (1.6 m), weight 50.8 kg, SpO2 99%.  Filed Weights   11/04/23 0604  Weight: 50.8 kg    Labs & Radiologic Studies    CBC No results for input(s): "WBC", "NEUTROABS", "HGB", "HCT", "MCV", "PLT" in the last 72 hours. Basic Metabolic Panel No results for input(s): "NA", "K", "CL", "CO2", "GLUCOSE", "BUN", "CREATININE", "  CALCIUM", "MG", "PHOS" in the last 72 hours. Liver Function Tests No results for input(s): "AST", "ALT", "ALKPHOS", "BILITOT", "PROT", "ALBUMIN" in the last 72 hours. No results for input(s): "LIPASE", "AMYLASE" in the last 72 hours. High Sensitivity Troponin:   Recent Labs  Lab 10/19/23 1522 10/19/23 1644  TROPONINIHS 44* 38*    BNP Invalid input(s): "POCBNP" D-Dimer No results for input(s): "DDIMER" in the last 72 hours. Hemoglobin A1C No results for input(s): "HGBA1C" in the last 72 hours. Fasting Lipid Panel No results for input(s):  "CHOL", "HDL", "LDLCALC", "TRIG", "CHOLHDL", "LDLDIRECT" in the last 72 hours. Thyroid Function Tests No results for input(s): "TSH", "T4TOTAL", "T3FREE", "THYROIDAB" in the last 72 hours.  Invalid input(s): "FREET3" _____________  EP STUDY Result Date: 11/04/2023 CONCLUSIONS: 1. Sinus rhythm upon presentation. 2. The patient had dual AV nodal physiology with easily inducible classic AV nodal reentrant tachycardia, there were no other accessory pathways or arrhythmias induced 3. Successful radiofrequency ablation of the slow AV nodal pathway, no longer present after RFA 4. No inducible arrhythmias following ablation. 5. No early apparent complications. Lewayne Bunting MD, [facility] 11/04/2023 9:44 AM   CUP PACEART REMOTE DEVICE CHECK Result Date: 10/27/2023 Monthly battery check.  Estimated 41mo Normal device function. No new alerts. Follow up as scheduled monthly LA, CVRS  CUP PACEART INCLINIC DEVICE CHECK Result Date: 10/25/2023 CRT-D device check in office. Thresholds and sensing consistent with previous device measurements. Lead impedance trends stable over time.  No mode switch episodes. No ventricular arrhythmia episodes recorded. Patient biventricular pacing __98.2_ of the time. Device programmed with appropriate safety margins. Heart failure diagnostics reviewed and trends are stable for patient. No changes made this session. Estimated longevity ___2 months______.  Patient enrolled in remote follow up. PDF did not transfer, see printed/scanned report no new arrhythmias see office note for discussion/management plan  ECHOCARDIOGRAM COMPLETE Result Date: 10/20/2023    ECHOCARDIOGRAM REPORT   Patient Name:   [name] Date of Exam: 10/20/2023 Medical Rec #:  [id number]           Height:       63.0 in Accession #:    [id number]          Weight:       113.0 lb Date of Birth:  [birth year]           BSA:          1.517 m Patient Age:    72 years            BP:           129/89 mmHg Patient  Gender: F                   HR:           93 bpm. Exam Location:  Inpatient Procedure: 2D Echo, Cardiac Doppler and Color Doppler Indications:    dyspnea  History:        Patient has prior history of Echocardiogram examinations, most                 recent 09/29/2022. Cardiomyopathy, Pacemaker and Defibrillator,                 Stroke; Risk Factors:Hypertension, Diabetes and Dyslipidemia.  Sonographer:    Melissa Morford RDCS (AE, PE) Referring Phys: [id number] CHING T TU IMPRESSIONS  1. Left ventricular ejection fraction, by estimation, is 45 to 50%. The left ventricle has mildly decreased function. The left ventricle demonstrates global  hypokinesis. Left ventricular diastolic parameters are indeterminate.  2. Right ventricular systolic function is low normal. The right ventricular size is normal. Tricuspid regurgitation signal is inadequate for assessing PA pressure.  3. Right atrial size was mild to moderately dilated.  4. The mitral valve is grossly normal. Mild mitral valve regurgitation. No evidence of mitral stenosis.  5. The aortic valve is tricuspid. There is mild calcification of the aortic valve. Aortic valve regurgitation is not visualized. No aortic stenosis is present.  6. The pulmonic valve was abnormal.  7. The inferior vena cava is normal in size with greater than 50% respiratory variability, suggesting right atrial pressure of 3 mmHg. Comparison(s): No significant change from prior study. FINDINGS  Left Ventricle: Left ventricular ejection fraction, by estimation, is 45 to 50%. The left ventricle has mildly decreased function. The left ventricle demonstrates global hypokinesis. The left ventricular internal cavity size was normal in size. There is  no left ventricular hypertrophy. Left ventricular diastolic parameters are indeterminate. Right Ventricle: The right ventricular size is normal. No increase in right ventricular wall thickness. Right ventricular systolic function is low normal. Tricuspid  regurgitation signal is inadequate for assessing PA pressure. Left Atrium: Left atrial size was normal in size. Right Atrium: Right atrial size was mild to moderately dilated. Pericardium: There is no evidence of pericardial effusion. Mitral Valve: The mitral valve is grossly normal. There is mild thickening of the mitral valve leaflet(s). There is mild calcification of the mitral valve leaflet(s). Mild mitral valve regurgitation. No evidence of mitral valve stenosis. Tricuspid Valve: The tricuspid valve is normal in structure. Tricuspid valve regurgitation is trivial. No evidence of tricuspid stenosis. Aortic Valve: The aortic valve is tricuspid. There is mild calcification of the aortic valve. Aortic valve regurgitation is not visualized. No aortic stenosis is present. Pulmonic Valve: The pulmonic valve was abnormal. Pulmonic valve regurgitation is not visualized. No evidence of pulmonic stenosis. Aorta: The aortic root, ascending aorta and aortic arch are all structurally normal, with no evidence of dilitation or obstruction. Venous: The inferior vena cava is normal in size with greater than 50% respiratory variability, suggesting right atrial pressure of 3 mmHg. IAS/Shunts: The atrial septum is grossly normal. Additional Comments: A device lead is visualized.  LEFT VENTRICLE PLAX 2D LVIDd:         3.50 cm     Diastology LVIDs:         2.80 cm     LV e' medial:    4.68 cm/s LV PW:         0.90 cm     LV E/e' medial:  8.9 LV IVS:        0.80 cm     LV e' lateral:   3.92 cm/s LVOT diam:     2.20 cm     LV E/e' lateral: 10.6 LV SV:         51 LV SV Index:   34 LVOT Area:     3.80 cm                             3D Volume EF: LV Volumes (MOD)           3D EF:        43 % LV vol d, MOD A2C: 43.4 ml LV EDV:       127 ml LV vol d, MOD A4C: 65.8 ml LV ESV:  73 ml LV vol s, MOD A2C: 24.9 ml LV SV:        54 ml LV vol s, MOD A4C: 32.9 ml LV SV MOD A2C:     18.5 ml LV SV MOD A4C:     65.8 ml LV SV MOD BP:      25.2 ml  RIGHT VENTRICLE            IVC RV S prime:     7.29 cm/s  IVC diam: 0.90 cm TAPSE (M-mode): 2.1 cm LEFT ATRIUM             Index        RIGHT ATRIUM           Index LA diam:        3.10 cm 2.04 cm/m   RA Area:     15.50 cm LA Vol (A2C):   14.8 ml 9.76 ml/m   RA Volume:   39.40 ml  25.97 ml/m LA Vol (A4C):   24.0 ml 15.82 ml/m LA Biplane Vol: 20.2 ml 13.32 ml/m  AORTIC VALVE LVOT Vmax:   71.10 cm/s LVOT Vmean:  56.500 cm/s LVOT VTI:    0.134 m  AORTA Ao Root diam: 2.90 cm Ao Asc diam:  3.70 cm MITRAL VALVE MV Area (PHT): 4.01 cm    SHUNTS MV Decel Time: 189 msec    Systemic VTI:  0.13 m MV E velocity: 41.60 cm/s  Systemic Diam: 2.20 cm MV A velocity: 76.30 cm/s MV E/A ratio:  0.55 Jodelle Red MD Electronically signed by Jodelle Red MD Signature Date/Time: 10/20/2023/2:05:47 PM    Final    NM Pulmonary Perfusion Result Date: 10/20/2023 CLINICAL DATA:  Negative D-dimer. Concern for pulmonary embolism. Load intermediate probability. EXAM: NUCLEAR MEDICINE PERFUSION LUNG SCAN TECHNIQUE: Perfusion images were obtained in multiple projections after intravenous injection of radiopharmaceutical. RADIOPHARMACEUTICALS:  4.1 mCi Tc-43m MAA COMPARISON:  None Available. FINDINGS: No wedge-shaped peripheral perfusion defect within LEFT or RIGHT lung to suggest acute pulmonary embolism. Normal perfusion pattern. Photopenia in the LEFT upper lobe related to cardiac pacer maker IMPRESSION: Normal pulmonary perfusion exam. No evidence acute pulmonary disease. Electronically Signed   By: Genevive Bi M.D.   On: 10/20/2023 13:54   DG Chest 2 View Result Date: 10/19/2023 CLINICAL DATA:  Shortness of breath. EXAM: CHEST - 2 VIEW COMPARISON:  Apr 06, 2022. FINDINGS: The heart size and mediastinal contours are within normal limits. Stable left-sided defibrillator. Status post coronary artery bypass graft. Both lungs are clear. The visualized skeletal structures are unremarkable. IMPRESSION: No active  cardiopulmonary disease. Electronically Signed   By: Lupita Raider M.D.   On: 10/19/2023 16:46   Disposition   Pt is being discharged home today in good condition.  Follow-up Plans & Appointments     Follow-up Information     Marinus Maw, MD Follow up.   Specialty: Cardiology Why: Somebody from our office will call to arrange follow-up. Contact information: [address] [place] [address] [phone]                Discharge Instructions     Diet - low sodium heart healthy   Complete by: As directed    Increase activity slowly   Complete by: As directed         Discharge Medications   Allergies as of 11/05/2023       Reactions   Calcium Channel Blockers Other (See Comments)   Cannot  take non-DHP CCBs (diltiazem, verapamil) due to CHF requiring ICD   Codeine Anaphylaxis, Other (See Comments)   Lyrica [pregabalin]    Nightmares, and swelling   Other Anaphylaxis, Other (See Comments)   Walnuts, pecans, and ANY melons PATIENT IS A VEGETARIAN   Ramipril Anaphylaxis, Swelling, Other (See Comments)   Ticlid [ticlopidine Hcl] Other (See Comments)   Unprovoked bleeding while on Ticlid (doesn't think she was on ASA at the time) Takes Plavix without problems   Morphine And Codeine Other (See Comments)   Severe AMS, confusion, weakness Tolerates Fentanyl, Tramadol   Tape Dermatitis, Rash   Tolerates paper tape   Cucumber Extract Other (See Comments)   Digoxin Other (See Comments)   Digoxin And Related Nausea Only   Unknown   Diltiazem Hcl Other (See Comments)   Diltiazem Hcl Er Beads Other (See Comments)   Metformin Hcl Other (See Comments)   Metolazone Other (See Comments)   Cannot remember reaction   Morphine Sulfate Other (See Comments)   Myrbetriq [mirabegron] Other (See Comments)   Aggravates migraines   Nsaids Other (See Comments)   Onglyza [saxagliptin] Other (See Comments)   Exacerbates migraines   Penicillin V Other  (See Comments)   Penicillins Hives   Has patient had a PCN reaction causing immediate rash, facial/tongue/throat swelling, SOB or lightheadedness with hypotension: Yes Has patient had a PCN reaction causing severe rash involving mucus membranes or skin necrosis: Unknown Has patient had a PCN reaction that required hospitalization: Unknown Has patient had a PCN reaction occurring within the last 10 years: No If all of the above answers are "NO", then may proceed with Cephalosporin use.   Spironolactone Other (See Comments)   Cannot remember reaction   Sulfa Antibiotics Hives, Other (See Comments)   Tetracycline Hcl Other (See Comments)   Tetracyclines & Related Other (See Comments)   Cannot remember reaction Tolerates macrolides (azithromycin)   Ticlopidine Other (See Comments)   Verapamil Other (See Comments)   Pacemaker/CHF   Vicodin [hydrocodone-acetaminophen] Other (See Comments)   EXTREME LETHARGY   Latex Itching, Rash, Other (See Comments)        Medication List     PAUSE taking these medications    Lantus SoloStar 100 UNIT/ML Solostar Pen Wait to take this until your doctor or other care provider tells you to start again. Generic drug: insulin glargine Inject 6 Units into the skin at bedtime.       TAKE these medications    ALAWAY OP Place 1 drop into both eyes daily as needed (allergies).   albuterol 108 (90 Base) MCG/ACT inhaler Commonly known as: VENTOLIN HFA Inhale 2 puffs into the lungs every 4 (four) hours as needed for wheezing or shortness of breath.   ALPRAZolam 1 MG tablet Commonly known as: XANAX Take 1 mg by mouth 3 (three) times daily.   Artificial Tear Gel Place 2 drops into both eyes daily as needed (dry eyes).   aspirin EC 81 MG tablet Take 4 tablets (325 mg total) by mouth daily. What changed: medication strength   B-D SINGLE USE SWABS REGULAR Pads   BIOTIN PO Take 1 tablet by mouth daily.   cetirizine 10 MG tablet Commonly known  as: ZYRTEC Take 10 mg by mouth daily.   clopidogrel 75 MG tablet Commonly known as: PLAVIX TAKE 1 TABLET EVERY DAY   doxazosin 4 MG tablet Commonly known as: CARDURA Take 4 mg by mouth at bedtime.   Droplet Pen Needles 32G X 4 MM  Misc Generic drug: Insulin Pen Needle   EPINEPHrine 0.3 mg/0.3 mL Soaj injection Commonly known as: EPI-PEN Inject 0.3 mg into the muscle as needed for anaphylaxis.   ezetimibe 10 MG tablet Commonly known as: ZETIA TAKE 1 TABLET EVERY DAY   Flaxseed Oil 1200 MG Caps Take 1,200 mg by mouth daily.   fluticasone 50 MCG/ACT nasal spray Commonly known as: FLONASE Place 2 sprays into both nostrils daily as needed for allergies or rhinitis.   furosemide 80 MG tablet Commonly known as: LASIX TAKE 1 TABLET TWICE A DAY. MAY TAKE AN ADDITIONAL 80MG  (1 TABLET) AS NEEDED FOR EDEMA What changed: See the new instructions.   hydrALAZINE 25 MG tablet Commonly known as: APRESOLINE TAKE 1 TABLET TWICE DAILY   isosorbide mononitrate 60 MG 24 hr tablet Commonly known as: IMDUR TAKE 1 TABLET EVERY DAY   metoprolol succinate 100 MG 24 hr tablet Commonly known as: TOPROL-XL TAKE 2 TABLETS EVERY DAY WITH OR IMMEDIATELY FOLLOWING A MEAL   metoprolol tartrate 25 MG tablet Commonly known as: LOPRESSOR Take 1 tablet (25 mg total) by mouth every 6 (six) hours as needed (palpitations).   multivitamin with minerals Tabs tablet Take 1 tablet by mouth daily.   nitroGLYCERIN 0.4 MG SL tablet Commonly known as: Nitrostat Place 1 tablet under the tongue every 5 minutes as needed for chest pain, max 3 doses, go to er if no relief   olmesartan 40 MG tablet Commonly known as: BENICAR Take 1 tablet (40 mg total) by mouth daily.   Ozempic (0.25 or 0.5 MG/DOSE) 2 MG/1.5ML Sopn Generic drug: Semaglutide(0.25 or 0.5MG /DOS) Inject 0.25 mg into the skin once a week. Sunday   pantoprazole 40 MG tablet Commonly known as: PROTONIX TAKE 1 TABLET BY MOUTH  DAILY   potassium  chloride 10 MEQ tablet Commonly known as: KLOR-CON M TAKE 2 TABLETS TWICE A DAY (KEEP MD APPOINTMENT FOR REFILLS) What changed:  how much to take how to take this when to take this   simvastatin 40 MG tablet Commonly known as: ZOCOR Take 1 tablet (40 mg total) by mouth at bedtime. What changed: when to take this   sodium chloride 5 % ophthalmic ointment Commonly known as: MURO 128 Place 1 drop into both eyes at bedtime as needed for eye irritation. For dry eyes   traMADol 50 MG tablet Commonly known as: ULTRAM Take 100 mg by mouth every 6 (six) hours as needed (MIGRAINES).   True Metrix Blood Glucose Test test strip Generic drug: glucose blood   TRUEplus Lancets 33G Misc   venlafaxine 100 MG tablet Commonly known as: EFFEXOR Take 100-200 mg by mouth See admin instructions. TAKE 200 mg by mouth in the morning and take 100 mg at noon   vitamin A 7500 UNIT capsule Take 2,400 Units by mouth daily.   Vitamin D-3 25 MCG (1000 UT) Caps Take 1,000 Units by mouth daily.           Outstanding Labs/Studies     Duration of Discharge Encounter   Greater than 30 minutes including physician time.  Signed, Abagail Kitchens, PA-C 11/05/2023, 11:19 AM

## 2023-11-05 NOTE — Progress Notes (Signed)
   Rounding Note    Patient Name: [name] Date of Encounter: 11/05/2023  [facility] Cardiologist: Lesleigh Noe, MD (Inactive)   Subjective   NAEO.   Vital Signs    Vitals:   11/04/23 1619 11/04/23 2117 11/04/23 2345 11/05/23 0544  BP: 129/70 (!) 142/77  (!) 145/89  Pulse:  69 76   Resp: 15 16 16 16   Temp: 98.6 F (37 C)  98.2 F (36.8 C) 98.5 F (36.9 C)  TempSrc: Oral  Oral Oral  SpO2:  100% 99%   Weight:      Height:        Intake/Output Summary (Last 24 hours) at 11/05/2023 1034 Last data filed at 11/04/2023 2100 Gross per 24 hour  Intake 492.55 ml  Output 600 ml  Net -107.45 ml      11/04/2023    6:04 AM 10/25/2023   10:07 AM 10/20/2023    2:21 PM  Last 3 Weights  Weight (lbs) 112 lb 113 lb 112 lb 14 oz  Weight (kg) 50.803 kg 51.256 kg 51.2 kg      Telemetry    Personally Reviewed  ECG    Personally Reviewed  Physical Exam   GEN: No acute distress.   Cardiac: RRR, no murmurs, rubs, or gallops. RFV access site healing well. Respiratory: Clear to auscultation bilaterally. Psych: Normal affect   Assessment & Plan    #SVT #AVNRT S/p ablation 11/04/2023. Feels well this AM. OK to discharge.  #ICD in situ Device functioning appropriately. Continue remote monitoring.  OK to discharge.    Sheria Lang T. Lalla Brothers, MD, [facility], [facility]

## 2023-11-05 NOTE — Plan of Care (Signed)
  Problem: Clinical Measurements: Goal: Respiratory complications will improve Outcome: Progressing Goal: Cardiovascular complication will be avoided Outcome: Progressing   Problem: Coping: Goal: Level of anxiety will decrease Outcome: Progressing   Problem: Pain Management: Goal: General experience of comfort will improve Outcome: Progressing   Problem: Education: Goal: Understanding of disease, treatment, and recovery process will improve Outcome: Progressing   Problem: Activity: Goal: Ability to return to baseline activity level will improve Outcome: Progressing   Problem: Health Behavior/ Discharge Planning: Goal: Ability to safely manage health related needs after discharge Outcome: Progressing   Problem: Education: Goal: Knowledge of disease or condition will improve Outcome: Progressing Goal: Understanding of medication regimen will improve Outcome: Progressing Goal: Individualized Educational Video(s) Outcome: Progressing   Problem: Activity: Goal: Ability to tolerate increased activity will improve Outcome: Progressing   Problem: Cardiac: Goal: Ability to achieve and maintain adequate cardiopulmonary perfusion will improve Outcome: Progressing   Problem: Health Behavior/Discharge Planning: Goal: Ability to safely manage health-related needs after discharge will improve Outcome: Progressing

## 2023-11-05 NOTE — Plan of Care (Signed)
  Problem: Coping: Goal: Level of anxiety will decrease Outcome: Progressing   Problem: Pain Management: Goal: General experience of comfort will improve Outcome: Progressing   Problem: Education: Goal: Knowledge of General Education information will improve Description: Including pain rating scale, medication(s)/side effects and non-pharmacologic comfort measures Outcome: Completed/Met   Problem: Health Behavior/Discharge Planning: Goal: Ability to manage health-related needs will improve Outcome: Completed/Met   Problem: Clinical Measurements: Goal: Ability to maintain clinical measurements within normal limits will improve Outcome: Completed/Met Goal: Will remain free from infection Outcome: Completed/Met Goal: Diagnostic test results will improve Outcome: Completed/Met   Problem: Activity: Goal: Risk for activity intolerance will decrease Outcome: Completed/Met   Problem: Nutrition: Goal: Adequate nutrition will be maintained Outcome: Completed/Met   Problem: Elimination: Goal: Will not experience complications related to bowel motility Outcome: Completed/Met Goal: Will not experience complications related to urinary retention Outcome: Completed/Met   Problem: Safety: Goal: Ability to remain free from injury will improve Outcome: Completed/Met   Problem: Skin Integrity: Goal: Risk for impaired skin integrity will decrease Outcome: Completed/Met   Problem: Cardiac: Goal: Ability to maintain adequate cardiovascular perfusion will improve Outcome: Completed/Met Goal: Vascular access site(s) Level 0-1 will be maintained Outcome: Completed/Met

## 2023-11-07 ENCOUNTER — Encounter (HOSPITAL_COMMUNITY): Payer: Self-pay | Admitting: Internal Medicine

## 2023-11-07 DIAGNOSIS — D631 Anemia in chronic kidney disease: Secondary | ICD-10-CM | POA: Diagnosis not present

## 2023-11-07 DIAGNOSIS — N184 Chronic kidney disease, stage 4 (severe): Secondary | ICD-10-CM | POA: Diagnosis not present

## 2023-11-07 DIAGNOSIS — I129 Hypertensive chronic kidney disease with stage 1 through stage 4 chronic kidney disease, or unspecified chronic kidney disease: Secondary | ICD-10-CM | POA: Diagnosis not present

## 2023-11-07 DIAGNOSIS — N2581 Secondary hyperparathyroidism of renal origin: Secondary | ICD-10-CM | POA: Diagnosis not present

## 2023-11-11 ENCOUNTER — Ambulatory Visit (HOSPITAL_COMMUNITY)
Admission: RE | Admit: 2023-11-11 | Discharge: 2023-11-11 | Disposition: A | Discharge status: Home / Self Care | Payer: Medicare HMO | Source: Ambulatory Visit | Attending: Nephrology | Admitting: Nephrology

## 2023-11-11 VITALS — BP 121/78 | HR 63 | Temp 97.9°F | Resp 16

## 2023-11-11 DIAGNOSIS — N183 Chronic kidney disease, stage 3 unspecified: Secondary | ICD-10-CM | POA: Insufficient documentation

## 2023-11-11 LAB — FERRITIN: Ferritin: 43 ng/mL (ref 11–307)

## 2023-11-11 LAB — IRON AND TIBC
Iron: 94 ug/dL (ref 28–170)
Saturation Ratios: 24 % (ref 10.4–31.8)
TIBC: 388 ug/dL (ref 250–450)
UIBC: 294 ug/dL

## 2023-11-11 LAB — CBC
HCT: 30.3 % — ABNORMAL LOW (ref 36.0–46.0)
Hemoglobin: 10.3 g/dL — ABNORMAL LOW (ref 12.0–15.0)
MCH: 29.3 pg (ref 26.0–34.0)
MCHC: 34 g/dL (ref 30.0–36.0)
MCV: 86.3 fL (ref 80.0–100.0)
Platelets: 192 10*3/uL (ref 150–400)
RBC: 3.51 MIL/uL — ABNORMAL LOW (ref 3.87–5.11)
RDW: 13.2 % (ref 11.5–15.5)
WBC: 5.9 10*3/uL (ref 4.0–10.5)
nRBC: 0 % (ref 0.0–0.2)

## 2023-11-11 MED ORDER — EPOETIN ALFA-EPBX 10000 UNIT/ML IJ SOLN
10000.0000 [IU] | INTRAMUSCULAR | Status: DC
Start: 2023-11-11 — End: 2024-11-23
  Administered 2023-11-11: 10000 [IU] via SUBCUTANEOUS

## 2023-11-11 MED ORDER — EPOETIN ALFA-EPBX 10000 UNIT/ML IJ SOLN
INTRAMUSCULAR | Status: AC
  Filled 2023-11-11: qty 1

## 2023-11-14 DIAGNOSIS — Z1231 Encounter for screening mammogram for malignant neoplasm of breast: Secondary | ICD-10-CM | POA: Diagnosis not present

## 2023-11-14 LAB — POCT HEMOGLOBIN-HEMACUE: Hemoglobin: 9.9 g/dL — ABNORMAL LOW (ref 12.0–15.0)

## 2023-11-22 ENCOUNTER — Ambulatory Visit: Payer: Medicare HMO

## 2023-11-23 ENCOUNTER — Telehealth: Payer: Self-pay | Admitting: Internal Medicine

## 2023-11-23 NOTE — Telephone Encounter (Signed)
 Patient is inquiring on the sound of the device alert for her current device. She states that for MDT, it had an alert that she specifically could hear. She would like to know if her device now, has the same thing, and if so, she would like to hear it. She would like an update on the battery of her device. She would like a follow up tomorrow, as she is leaving for an appointment.

## 2023-11-24 DIAGNOSIS — N6312 Unspecified lump in the right breast, upper inner quadrant: Secondary | ICD-10-CM | POA: Diagnosis not present

## 2023-11-24 DIAGNOSIS — N6001 Solitary cyst of right breast: Secondary | ICD-10-CM | POA: Diagnosis not present

## 2023-11-24 DIAGNOSIS — R922 Inconclusive mammogram: Secondary | ICD-10-CM | POA: Diagnosis not present

## 2023-11-24 NOTE — Telephone Encounter (Signed)
Called patient to send transmission. States she is not able to send at this time but will later today.

## 2023-11-24 NOTE — Telephone Encounter (Signed)
Attempted to call patient to inquire about the remote transmission. No answer, LMTCB.

## 2023-11-25 NOTE — Telephone Encounter (Signed)
Discussed with patient her questions regarding battery usage and alerts.  Provided reassurance.  Patient has appt scheduled with Dr. Ladona Ridgel in March for follow up, will discuss gen change at that time.   Of note on transmission today: Normal Device Function Optivol however is elevated and patient feeling SOB with lying flat (new X 2 weeks) denies any other s/s of fluid retention.  LVP: 98.2%   She is taking Furosemide 80 mg bid.  Can take 1 extra prn for edema. Pt concerned because she has renal failure.  Reviewed with Dr. Ladona Ridgel:  He agrees patient should take the 1 extra 80mg  dose X 1 day and continue to  monitor weight and symptoms.  She is to call back if not improving or if worsens.   No further questions.

## 2023-11-28 ENCOUNTER — Ambulatory Visit (INDEPENDENT_AMBULATORY_CARE_PROVIDER_SITE_OTHER): Payer: Medicare HMO

## 2023-11-28 DIAGNOSIS — I5022 Chronic systolic (congestive) heart failure: Secondary | ICD-10-CM

## 2023-11-28 DIAGNOSIS — I255 Ischemic cardiomyopathy: Secondary | ICD-10-CM | POA: Diagnosis not present

## 2023-11-28 DIAGNOSIS — F331 Major depressive disorder, recurrent, moderate: Secondary | ICD-10-CM | POA: Diagnosis not present

## 2023-11-28 DIAGNOSIS — F411 Generalized anxiety disorder: Secondary | ICD-10-CM | POA: Diagnosis not present

## 2023-11-29 LAB — CUP PACEART REMOTE DEVICE CHECK
Battery Remaining Longevity: 2 mo
Battery Voltage: 2.79 V
Brady Statistic AP VP Percent: 0.02 %
Brady Statistic AP VS Percent: 0.05 %
Brady Statistic AS VP Percent: 98.48 %
Brady Statistic AS VS Percent: 1.44 %
Brady Statistic RA Percent Paced: 0.07 %
Brady Statistic RV Percent Paced: 0.04 %
Date Time Interrogation Session: 20250120043825
HighPow Impedance: 36 Ohm
HighPow Impedance: 47 Ohm
Implantable Lead Connection Status: 753985
Implantable Lead Connection Status: 753985
Implantable Lead Connection Status: 753985
Implantable Lead Implant Date: 20090610
Implantable Lead Implant Date: 20090610
Implantable Lead Implant Date: 20090610
Implantable Lead Location: 753858
Implantable Lead Location: 753859
Implantable Lead Location: 753860
Implantable Lead Model: 4194
Implantable Lead Model: 5076
Implantable Lead Model: 6947
Implantable Pulse Generator Implant Date: 20160323
Lead Channel Impedance Value: 285 Ohm
Lead Channel Impedance Value: 342 Ohm
Lead Channel Impedance Value: 456 Ohm
Lead Channel Impedance Value: 456 Ohm
Lead Channel Impedance Value: 551 Ohm
Lead Channel Impedance Value: 665 Ohm
Lead Channel Pacing Threshold Amplitude: 0.75 V
Lead Channel Pacing Threshold Amplitude: 0.75 V
Lead Channel Pacing Threshold Amplitude: 1.25 V
Lead Channel Pacing Threshold Pulse Width: 0.4 ms
Lead Channel Pacing Threshold Pulse Width: 0.4 ms
Lead Channel Pacing Threshold Pulse Width: 0.4 ms
Lead Channel Sensing Intrinsic Amplitude: 2.125 mV
Lead Channel Sensing Intrinsic Amplitude: 2.125 mV
Lead Channel Sensing Intrinsic Amplitude: 3.875 mV
Lead Channel Sensing Intrinsic Amplitude: 3.875 mV
Lead Channel Setting Pacing Amplitude: 1.75 V
Lead Channel Setting Pacing Amplitude: 2 V
Lead Channel Setting Pacing Amplitude: 2.25 V
Lead Channel Setting Pacing Pulse Width: 0.4 ms
Lead Channel Setting Pacing Pulse Width: 0.4 ms
Lead Channel Setting Sensing Sensitivity: 0.3 mV
Zone Setting Status: 755011
Zone Setting Status: 755011

## 2023-12-01 DIAGNOSIS — I2510 Atherosclerotic heart disease of native coronary artery without angina pectoris: Secondary | ICD-10-CM | POA: Diagnosis not present

## 2023-12-01 DIAGNOSIS — E1151 Type 2 diabetes mellitus with diabetic peripheral angiopathy without gangrene: Secondary | ICD-10-CM | POA: Diagnosis not present

## 2023-12-01 DIAGNOSIS — Z8673 Personal history of transient ischemic attack (TIA), and cerebral infarction without residual deficits: Secondary | ICD-10-CM | POA: Diagnosis not present

## 2023-12-01 DIAGNOSIS — Z833 Family history of diabetes mellitus: Secondary | ICD-10-CM | POA: Diagnosis not present

## 2023-12-01 DIAGNOSIS — N185 Chronic kidney disease, stage 5: Secondary | ICD-10-CM | POA: Diagnosis not present

## 2023-12-02 ENCOUNTER — Encounter (HOSPITAL_COMMUNITY)
Admission: RE | Admit: 2023-12-02 | Discharge: 2023-12-02 | Disposition: A | Discharge status: Home / Self Care | Payer: Medicare HMO | Source: Ambulatory Visit | Attending: Nephrology | Admitting: Nephrology

## 2023-12-02 ENCOUNTER — Encounter: Payer: Self-pay | Admitting: Internal Medicine

## 2023-12-02 VITALS — BP 126/80 | HR 74 | Temp 97.9°F | Resp 16

## 2023-12-02 DIAGNOSIS — N183 Chronic kidney disease, stage 3 unspecified: Secondary | ICD-10-CM | POA: Diagnosis not present

## 2023-12-02 LAB — CBC
HCT: 36.2 % (ref 36.0–46.0)
Hemoglobin: 12.1 g/dL (ref 12.0–15.0)
MCH: 29 pg (ref 26.0–34.0)
MCHC: 33.4 g/dL (ref 30.0–36.0)
MCV: 86.8 fL (ref 80.0–100.0)
Platelets: 180 10*3/uL (ref 150–400)
RBC: 4.17 MIL/uL (ref 3.87–5.11)
RDW: 13.4 % (ref 11.5–15.5)
WBC: 6.1 10*3/uL (ref 4.0–10.5)
nRBC: 0 % (ref 0.0–0.2)

## 2023-12-02 LAB — IRON AND TIBC
Iron: 71 ug/dL (ref 28–170)
Saturation Ratios: 17 % (ref 10.4–31.8)
TIBC: 430 ug/dL (ref 250–450)
UIBC: 359 ug/dL

## 2023-12-02 LAB — POCT HEMOGLOBIN-HEMACUE: Hemoglobin: 11.2 g/dL — ABNORMAL LOW (ref 12.0–15.0)

## 2023-12-02 LAB — FERRITIN: Ferritin: 40 ng/mL (ref 11–307)

## 2023-12-02 MED ORDER — EPOETIN ALFA-EPBX 10000 UNIT/ML IJ SOLN
INTRAMUSCULAR | Status: AC
  Filled 2023-12-02: qty 1

## 2023-12-02 MED ORDER — EPOETIN ALFA-EPBX 10000 UNIT/ML IJ SOLN
10000.0000 [IU] | INTRAMUSCULAR | Status: DC
  Administered 2023-12-02: 10000 [IU] via SUBCUTANEOUS

## 2023-12-02 NOTE — Progress Notes (Signed)
Remote ICD transmission.

## 2023-12-07 DIAGNOSIS — F411 Generalized anxiety disorder: Secondary | ICD-10-CM | POA: Diagnosis not present

## 2023-12-07 DIAGNOSIS — F331 Major depressive disorder, recurrent, moderate: Secondary | ICD-10-CM | POA: Diagnosis not present

## 2023-12-23 ENCOUNTER — Encounter (HOSPITAL_COMMUNITY)
Admission: RE | Admit: 2023-12-23 | Discharge: 2023-12-23 | Disposition: A | Discharge status: Home / Self Care | Payer: Medicare HMO | Source: Ambulatory Visit | Attending: Nephrology | Admitting: Nephrology

## 2023-12-23 VITALS — BP 126/84 | HR 74 | Temp 97.0°F | Resp 16

## 2023-12-23 DIAGNOSIS — N183 Chronic kidney disease, stage 3 unspecified: Secondary | ICD-10-CM | POA: Diagnosis not present

## 2023-12-23 LAB — IRON AND TIBC
Iron: 106 ug/dL (ref 28–170)
Saturation Ratios: 27 % (ref 10.4–31.8)
TIBC: 399 ug/dL (ref 250–450)
UIBC: 293 ug/dL

## 2023-12-23 LAB — CBC
HCT: 34.9 % — ABNORMAL LOW (ref 36.0–46.0)
Hemoglobin: 11.7 g/dL — ABNORMAL LOW (ref 12.0–15.0)
MCH: 28.8 pg (ref 26.0–34.0)
MCHC: 33.5 g/dL (ref 30.0–36.0)
MCV: 86 fL (ref 80.0–100.0)
Platelets: 183 10*3/uL (ref 150–400)
RBC: 4.06 MIL/uL (ref 3.87–5.11)
RDW: 13.5 % (ref 11.5–15.5)
WBC: 6.1 10*3/uL (ref 4.0–10.5)
nRBC: 0 % (ref 0.0–0.2)

## 2023-12-23 LAB — POCT HEMOGLOBIN-HEMACUE: Hemoglobin: 10.9 g/dL — ABNORMAL LOW (ref 12.0–15.0)

## 2023-12-23 LAB — FERRITIN: Ferritin: 49 ng/mL (ref 11–307)

## 2023-12-23 MED ORDER — EPOETIN ALFA-EPBX 10000 UNIT/ML IJ SOLN
INTRAMUSCULAR | Status: DC
Start: 2023-12-23 — End: 2023-12-24
  Filled 2023-12-23: qty 1

## 2023-12-23 MED ORDER — EPOETIN ALFA-EPBX 10000 UNIT/ML IJ SOLN
10000.0000 [IU] | INTRAMUSCULAR | Status: DC
  Administered 2023-12-23: 10000 [IU] via SUBCUTANEOUS

## 2023-12-29 ENCOUNTER — Ambulatory Visit (INDEPENDENT_AMBULATORY_CARE_PROVIDER_SITE_OTHER): Payer: Medicare HMO

## 2023-12-29 ENCOUNTER — Other Ambulatory Visit (HOSPITAL_COMMUNITY): Payer: Self-pay | Admitting: *Deleted

## 2023-12-29 DIAGNOSIS — I255 Ischemic cardiomyopathy: Secondary | ICD-10-CM

## 2023-12-30 ENCOUNTER — Encounter: Payer: Self-pay | Admitting: Internal Medicine

## 2023-12-30 ENCOUNTER — Encounter (HOSPITAL_COMMUNITY)
Admission: RE | Admit: 2023-12-30 | Discharge: 2023-12-30 | Disposition: A | Discharge status: Home / Self Care | Payer: Medicare HMO | Source: Ambulatory Visit | Attending: Nephrology | Admitting: Nephrology

## 2023-12-30 DIAGNOSIS — N183 Chronic kidney disease, stage 3 unspecified: Secondary | ICD-10-CM | POA: Diagnosis not present

## 2023-12-30 LAB — CUP PACEART REMOTE DEVICE CHECK
Battery Remaining Longevity: 1 mo
Battery Voltage: 2.77 V
Brady Statistic AP VP Percent: 0.02 %
Brady Statistic AP VS Percent: 0.04 %
Brady Statistic AS VP Percent: 98.51 %
Brady Statistic AS VS Percent: 1.43 %
Brady Statistic RA Percent Paced: 0.06 %
Brady Statistic RV Percent Paced: 0.04 %
Date Time Interrogation Session: 20250220022824
HighPow Impedance: 33 Ohm
HighPow Impedance: 47 Ohm
Implantable Lead Connection Status: 753985
Implantable Lead Connection Status: 753985
Implantable Lead Connection Status: 753985
Implantable Lead Implant Date: 20090610
Implantable Lead Implant Date: 20090610
Implantable Lead Implant Date: 20090610
Implantable Lead Location: 753858
Implantable Lead Location: 753859
Implantable Lead Location: 753860
Implantable Lead Model: 4194
Implantable Lead Model: 5076
Implantable Lead Model: 6947
Implantable Pulse Generator Implant Date: 20160323
Lead Channel Impedance Value: 247 Ohm
Lead Channel Impedance Value: 342 Ohm
Lead Channel Impedance Value: 418 Ohm
Lead Channel Impedance Value: 456 Ohm
Lead Channel Impedance Value: 513 Ohm
Lead Channel Impedance Value: 608 Ohm
Lead Channel Pacing Threshold Amplitude: 0.75 V
Lead Channel Pacing Threshold Amplitude: 0.875 V
Lead Channel Pacing Threshold Amplitude: 1.25 V
Lead Channel Pacing Threshold Pulse Width: 0.4 ms
Lead Channel Pacing Threshold Pulse Width: 0.4 ms
Lead Channel Pacing Threshold Pulse Width: 0.4 ms
Lead Channel Sensing Intrinsic Amplitude: 1.625 mV
Lead Channel Sensing Intrinsic Amplitude: 1.625 mV
Lead Channel Sensing Intrinsic Amplitude: 3.875 mV
Lead Channel Sensing Intrinsic Amplitude: 3.875 mV
Lead Channel Setting Pacing Amplitude: 1.75 V
Lead Channel Setting Pacing Amplitude: 2 V
Lead Channel Setting Pacing Amplitude: 2.25 V
Lead Channel Setting Pacing Pulse Width: 0.4 ms
Lead Channel Setting Pacing Pulse Width: 0.4 ms
Lead Channel Setting Sensing Sensitivity: 0.3 mV
Zone Setting Status: 755011
Zone Setting Status: 755011

## 2023-12-30 MED ORDER — IRON SUCROSE 200 MG IVPB - SIMPLE MED
200.0000 mg | Status: DC
  Administered 2023-12-30: 200 mg via INTRAVENOUS
  Filled 2023-12-30: qty 200

## 2024-01-04 DIAGNOSIS — F411 Generalized anxiety disorder: Secondary | ICD-10-CM | POA: Diagnosis not present

## 2024-01-04 DIAGNOSIS — F331 Major depressive disorder, recurrent, moderate: Secondary | ICD-10-CM | POA: Diagnosis not present

## 2024-01-06 ENCOUNTER — Encounter (HOSPITAL_COMMUNITY)
Admission: RE | Admit: 2024-01-06 | Discharge: 2024-01-06 | Disposition: A | Discharge status: Home / Self Care | Payer: Medicare HMO | Source: Ambulatory Visit | Attending: Nephrology | Admitting: Nephrology

## 2024-01-06 DIAGNOSIS — N183 Chronic kidney disease, stage 3 unspecified: Secondary | ICD-10-CM | POA: Diagnosis not present

## 2024-01-06 MED ORDER — IRON SUCROSE 200 MG IVPB - SIMPLE MED
200.0000 mg | Status: DC
  Administered 2024-01-06: 200 mg via INTRAVENOUS
  Filled 2024-01-06: qty 200

## 2024-01-06 NOTE — Progress Notes (Signed)
 Remote ICD transmission.

## 2024-01-06 NOTE — Addendum Note (Signed)
 Addended by: Geralyn Flash D on: 01/06/2024 10:01 AM   Modules accepted: Orders

## 2024-01-13 ENCOUNTER — Ambulatory Visit (HOSPITAL_COMMUNITY)
Admission: RE | Admit: 2024-01-13 | Discharge: 2024-01-13 | Disposition: A | Discharge status: Home / Self Care | Payer: Medicare HMO | Source: Ambulatory Visit | Attending: Nephrology | Admitting: Nephrology

## 2024-01-13 VITALS — BP 135/82 | HR 70 | Temp 98.0°F | Resp 18 | Wt 109.0 lb

## 2024-01-13 DIAGNOSIS — N183 Chronic kidney disease, stage 3 unspecified: Secondary | ICD-10-CM | POA: Insufficient documentation

## 2024-01-13 LAB — CBC
HCT: 37 % (ref 36.0–46.0)
Hemoglobin: 12.5 g/dL (ref 12.0–15.0)
MCH: 29.8 pg (ref 26.0–34.0)
MCHC: 33.8 g/dL (ref 30.0–36.0)
MCV: 88.1 fL (ref 80.0–100.0)
Platelets: 203 10*3/uL (ref 150–400)
RBC: 4.2 MIL/uL (ref 3.87–5.11)
RDW: 14.1 % (ref 11.5–15.5)
WBC: 6 10*3/uL (ref 4.0–10.5)
nRBC: 0 % (ref 0.0–0.2)

## 2024-01-13 LAB — POCT HEMOGLOBIN-HEMACUE: Hemoglobin: 11.6 g/dL — ABNORMAL LOW (ref 12.0–15.0)

## 2024-01-13 MED ORDER — EPOETIN ALFA-EPBX 10000 UNIT/ML IJ SOLN
INTRAMUSCULAR | Status: AC
  Filled 2024-01-13: qty 1

## 2024-01-13 MED ORDER — IRON SUCROSE 200 MG IVPB - SIMPLE MED
200.0000 mg | Status: DC
  Administered 2024-01-13: 200 mg via INTRAVENOUS
  Filled 2024-01-13: qty 200

## 2024-01-13 MED ORDER — EPOETIN ALFA-EPBX 10000 UNIT/ML IJ SOLN
10000.0000 [IU] | INTRAMUSCULAR | Status: DC
  Administered 2024-01-13: 10000 [IU] via SUBCUTANEOUS

## 2024-01-20 ENCOUNTER — Ambulatory Visit (HOSPITAL_COMMUNITY)
Admission: RE | Admit: 2024-01-20 | Discharge: 2024-01-20 | Disposition: A | Discharge status: Home / Self Care | Source: Ambulatory Visit | Attending: Nephrology | Admitting: Nephrology

## 2024-01-20 DIAGNOSIS — D631 Anemia in chronic kidney disease: Secondary | ICD-10-CM | POA: Insufficient documentation

## 2024-01-20 DIAGNOSIS — N184 Chronic kidney disease, stage 4 (severe): Secondary | ICD-10-CM | POA: Insufficient documentation

## 2024-01-20 MED ORDER — IRON SUCROSE 200 MG IVPB - SIMPLE MED
200.0000 mg | Status: DC
  Administered 2024-01-20: 200 mg via INTRAVENOUS
  Filled 2024-01-20: qty 200

## 2024-01-23 DIAGNOSIS — F331 Major depressive disorder, recurrent, moderate: Secondary | ICD-10-CM | POA: Diagnosis not present

## 2024-01-23 DIAGNOSIS — F411 Generalized anxiety disorder: Secondary | ICD-10-CM | POA: Diagnosis not present

## 2024-01-24 ENCOUNTER — Ambulatory Visit: Payer: Medicare HMO | Admitting: Neurology

## 2024-01-27 ENCOUNTER — Ambulatory Visit (HOSPITAL_COMMUNITY)
Admission: RE | Admit: 2024-01-27 | Discharge: 2024-01-27 | Disposition: A | Discharge status: Home / Self Care | Source: Ambulatory Visit | Attending: Nephrology | Admitting: Nephrology

## 2024-01-27 DIAGNOSIS — N189 Chronic kidney disease, unspecified: Secondary | ICD-10-CM | POA: Diagnosis not present

## 2024-01-27 DIAGNOSIS — D631 Anemia in chronic kidney disease: Secondary | ICD-10-CM | POA: Diagnosis not present

## 2024-01-27 MED ORDER — IRON SUCROSE 200 MG IVPB - SIMPLE MED
200.0000 mg | Status: DC
  Administered 2024-01-27: 200 mg via INTRAVENOUS
  Filled 2024-01-27: qty 200

## 2024-01-30 ENCOUNTER — Encounter: Payer: Self-pay | Admitting: Internal Medicine

## 2024-01-30 ENCOUNTER — Ambulatory Visit (INDEPENDENT_AMBULATORY_CARE_PROVIDER_SITE_OTHER): Payer: Medicare HMO

## 2024-01-30 DIAGNOSIS — I255 Ischemic cardiomyopathy: Secondary | ICD-10-CM

## 2024-01-30 DIAGNOSIS — I5022 Chronic systolic (congestive) heart failure: Secondary | ICD-10-CM

## 2024-01-30 LAB — CUP PACEART REMOTE DEVICE CHECK
Battery Remaining Longevity: 1 mo
Battery Voltage: 2.77 V
Brady Statistic AP VP Percent: 0.02 %
Brady Statistic AP VS Percent: 0.04 %
Brady Statistic AS VP Percent: 98.54 %
Brady Statistic AS VS Percent: 1.4 %
Brady Statistic RA Percent Paced: 0.06 %
Brady Statistic RV Percent Paced: 0.04 %
Date Time Interrogation Session: 20250324012402
HighPow Impedance: 39 Ohm
HighPow Impedance: 54 Ohm
Implantable Lead Connection Status: 753985
Implantable Lead Connection Status: 753985
Implantable Lead Connection Status: 753985
Implantable Lead Implant Date: 20090610
Implantable Lead Implant Date: 20090610
Implantable Lead Implant Date: 20090610
Implantable Lead Location: 753858
Implantable Lead Location: 753859
Implantable Lead Location: 753860
Implantable Lead Model: 4194
Implantable Lead Model: 5076
Implantable Lead Model: 6947
Implantable Pulse Generator Implant Date: 20160323
Lead Channel Impedance Value: 247 Ohm
Lead Channel Impedance Value: 361 Ohm
Lead Channel Impedance Value: 456 Ohm
Lead Channel Impedance Value: 475 Ohm
Lead Channel Impedance Value: 532 Ohm
Lead Channel Impedance Value: 646 Ohm
Lead Channel Pacing Threshold Amplitude: 0.625 V
Lead Channel Pacing Threshold Amplitude: 0.75 V
Lead Channel Pacing Threshold Amplitude: 1.25 V
Lead Channel Pacing Threshold Pulse Width: 0.4 ms
Lead Channel Pacing Threshold Pulse Width: 0.4 ms
Lead Channel Pacing Threshold Pulse Width: 0.4 ms
Lead Channel Sensing Intrinsic Amplitude: 2 mV
Lead Channel Sensing Intrinsic Amplitude: 2 mV
Lead Channel Sensing Intrinsic Amplitude: 3.875 mV
Lead Channel Sensing Intrinsic Amplitude: 3.875 mV
Lead Channel Setting Pacing Amplitude: 1.5 V
Lead Channel Setting Pacing Amplitude: 2 V
Lead Channel Setting Pacing Amplitude: 2.25 V
Lead Channel Setting Pacing Pulse Width: 0.4 ms
Lead Channel Setting Pacing Pulse Width: 0.4 ms
Lead Channel Setting Sensing Sensitivity: 0.3 mV
Zone Setting Status: 755011
Zone Setting Status: 755011

## 2024-01-31 DIAGNOSIS — E1121 Type 2 diabetes mellitus with diabetic nephropathy: Secondary | ICD-10-CM | POA: Diagnosis not present

## 2024-01-31 DIAGNOSIS — I2510 Atherosclerotic heart disease of native coronary artery without angina pectoris: Secondary | ICD-10-CM | POA: Diagnosis not present

## 2024-01-31 DIAGNOSIS — E7800 Pure hypercholesterolemia, unspecified: Secondary | ICD-10-CM | POA: Diagnosis not present

## 2024-01-31 DIAGNOSIS — I679 Cerebrovascular disease, unspecified: Secondary | ICD-10-CM | POA: Diagnosis not present

## 2024-01-31 DIAGNOSIS — N184 Chronic kidney disease, stage 4 (severe): Secondary | ICD-10-CM | POA: Diagnosis not present

## 2024-01-31 DIAGNOSIS — F3341 Major depressive disorder, recurrent, in partial remission: Secondary | ICD-10-CM | POA: Diagnosis not present

## 2024-01-31 DIAGNOSIS — I5022 Chronic systolic (congestive) heart failure: Secondary | ICD-10-CM | POA: Diagnosis not present

## 2024-01-31 DIAGNOSIS — I10 Essential (primary) hypertension: Secondary | ICD-10-CM | POA: Diagnosis not present

## 2024-01-31 DIAGNOSIS — J4520 Mild intermittent asthma, uncomplicated: Secondary | ICD-10-CM | POA: Diagnosis not present

## 2024-01-31 DIAGNOSIS — Z789 Other specified health status: Secondary | ICD-10-CM | POA: Diagnosis not present

## 2024-02-01 NOTE — Progress Notes (Signed)
 Remote ICD transmission.

## 2024-02-01 NOTE — Addendum Note (Signed)
 Addended by: Elease Etienne A on: 02/01/2024 10:50 AM   Modules accepted: Orders, Level of Service

## 2024-02-03 ENCOUNTER — Ambulatory Visit (HOSPITAL_COMMUNITY)
Admission: RE | Admit: 2024-02-03 | Discharge: 2024-02-03 | Disposition: A | Discharge status: Home / Self Care | Source: Ambulatory Visit | Attending: Nephrology | Admitting: Nephrology

## 2024-02-03 VITALS — BP 123/79 | HR 79 | Temp 97.0°F | Resp 17

## 2024-02-03 DIAGNOSIS — N183 Chronic kidney disease, stage 3 unspecified: Secondary | ICD-10-CM | POA: Diagnosis not present

## 2024-02-03 LAB — CBC
HCT: 37.4 % (ref 36.0–46.0)
Hemoglobin: 12.3 g/dL (ref 12.0–15.0)
MCH: 29.6 pg (ref 26.0–34.0)
MCHC: 32.9 g/dL (ref 30.0–36.0)
MCV: 89.9 fL (ref 80.0–100.0)
Platelets: 171 10*3/uL (ref 150–400)
RBC: 4.16 MIL/uL (ref 3.87–5.11)
RDW: 14.2 % (ref 11.5–15.5)
WBC: 6.4 10*3/uL (ref 4.0–10.5)
nRBC: 0 % (ref 0.0–0.2)

## 2024-02-03 LAB — IRON AND TIBC
Iron: 128 ug/dL (ref 28–170)
Saturation Ratios: 40 % — ABNORMAL HIGH (ref 10.4–31.8)
TIBC: 321 ug/dL (ref 250–450)
UIBC: 193 ug/dL

## 2024-02-03 LAB — POCT HEMOGLOBIN-HEMACUE: Hemoglobin: 12.1 g/dL (ref 12.0–15.0)

## 2024-02-03 LAB — FERRITIN: Ferritin: 537 ng/mL — ABNORMAL HIGH (ref 11–307)

## 2024-02-03 MED ORDER — EPOETIN ALFA-EPBX 10000 UNIT/ML IJ SOLN: 10000.0000 [IU] | INTRAMUSCULAR | Status: DC

## 2024-02-06 ENCOUNTER — Ambulatory Visit: Payer: Medicare HMO | Attending: Internal Medicine | Admitting: Internal Medicine

## 2024-02-06 ENCOUNTER — Encounter: Payer: Self-pay | Admitting: Internal Medicine

## 2024-02-06 VITALS — BP 104/66 | HR 73 | Ht 63.5 in | Wt 111.0 lb

## 2024-02-06 DIAGNOSIS — I471 Supraventricular tachycardia, unspecified: Secondary | ICD-10-CM | POA: Diagnosis not present

## 2024-02-06 DIAGNOSIS — I255 Ischemic cardiomyopathy: Secondary | ICD-10-CM | POA: Diagnosis not present

## 2024-02-06 DIAGNOSIS — Z95810 Presence of automatic (implantable) cardiac defibrillator: Secondary | ICD-10-CM | POA: Diagnosis not present

## 2024-02-06 DIAGNOSIS — N184 Chronic kidney disease, stage 4 (severe): Secondary | ICD-10-CM | POA: Diagnosis not present

## 2024-02-06 LAB — CUP PACEART INCLINIC DEVICE CHECK
Date Time Interrogation Session: 20250331151909
Implantable Lead Connection Status: 753985
Implantable Lead Connection Status: 753985
Implantable Lead Connection Status: 753985
Implantable Lead Implant Date: 20090610
Implantable Lead Implant Date: 20090610
Implantable Lead Implant Date: 20090610
Implantable Lead Location: 753858
Implantable Lead Location: 753859
Implantable Lead Location: 753860
Implantable Lead Model: 4194
Implantable Lead Model: 5076
Implantable Lead Model: 6947
Implantable Pulse Generator Implant Date: 20160323

## 2024-02-06 NOTE — Patient Instructions (Signed)
 Medication Instructions:  Your physician recommends that you continue on your current medications as directed. Please refer to the Current Medication list given to you today.  *If you need a refill on your cardiac medications before your next appointment, please call your pharmacy*  Lab Work: None ordered.  You may go to any [facility] Location for your lab work:  [facility] - [address] ([facility]) - [address] [phone] [address]  [place] - [address]   [place]  - [address]   [facility] - [address] - [address] [employer]  - [address] - [address] ([facility]   If you have labs (blood work) drawn today and your tests are completely normal, you will receive your results only by: [employer] (if you have MyChart)  If you have any lab test that is abnormal or we need to change your treatment, we will call you or send a MyChart message to review the results.  Testing/Procedures: None ordered.  Follow-Up: At [facility], you and your health needs are our priority.  As part of our continuing mission to provide you with exceptional heart care, we have created designated Provider Care Teams.  These Care Teams include your primary Cardiologist (physician) and Advanced Practice Providers (APPs -  Physician Assistants and Nurse Practitioners) who all work together to provide you with the care you need, when you need it.   Your next appointment:   5 month  The format for your next appointment:   In Person  Provider:   Lewayne Bunting, MD{or one of the following Advanced Practice Providers on your designated Care Team:   Francis Dowse, [place] Casimiro Needle "Mardelle Matte" [place], [place] Earnest Rosier, NP  Note: Remote monitoring is used to monitor your Pacemaker/ ICD from home. This monitoring reduces the number of office visits required to check your  device to one time per year. It allows [place] to keep an eye on the functioning of your device to ensure it is working properly.            Valet parking services will be available as well.

## 2024-02-06 NOTE — Progress Notes (Signed)
 HPI [name] returns today for followup. She is a pleasant 73 yo woman with CAD, s/p MI, with an ICM, s/p Biv ICD insertion. She has had trouble with weight gain and then was placed on Ozempic. She has lost another 40 lbs. She feels well. She appears much younger than her stated age. She also notes stage 4 renal failure.  She developed worsening SVT and underwent catheter ablation of AVNRT.  Allergies  Allergen Reactions   Calcium Channel Blockers Other (See Comments)    Cannot take non-DHP CCBs (diltiazem, verapamil) due to CHF requiring ICD   Codeine Anaphylaxis and Other (See Comments)   Lyrica [Pregabalin]     Nightmares, and swelling   Other Anaphylaxis and Other (See Comments)    Walnuts, pecans, and ANY melons PATIENT IS A VEGETARIAN    Ramipril Anaphylaxis, Swelling and Other (See Comments)   Ticlid [Ticlopidine Hcl] Other (See Comments)    Unprovoked bleeding while on Ticlid (doesn't think she was on ASA at the time) Takes Plavix without problems   Morphine And Codeine Other (See Comments)    Severe AMS, confusion, weakness Tolerates Fentanyl, Tramadol   Tape Dermatitis and Rash    Tolerates paper tape   Cucumber Extract Other (See Comments)   Digoxin Other (See Comments)   Digoxin And Related Nausea Only    Unknown   Diltiazem Hcl Other (See Comments)   Diltiazem Hcl Er Beads Other (See Comments)   Metformin Hcl Other (See Comments)   Metolazone Other (See Comments)    Cannot remember reaction   Morphine Sulfate Other (See Comments)   Myrbetriq [Mirabegron] Other (See Comments)    Aggravates migraines   Nsaids Other (See Comments)   Onglyza [Saxagliptin] Other (See Comments)    Exacerbates migraines   Penicillin V Other (See Comments)   Penicillins Hives    Has patient had a PCN reaction causing immediate rash, facial/tongue/throat swelling, SOB or lightheadedness with hypotension: Yes Has patient had a PCN reaction causing severe rash involving  mucus membranes or skin necrosis: Unknown Has patient had a PCN reaction that required hospitalization: Unknown Has patient had a PCN reaction occurring within the last 10 years: No If all of the above answers are "NO", then may proceed with Cephalosporin use.    Spironolactone Other (See Comments)    Cannot remember reaction   Sulfa Antibiotics Hives and Other (See Comments)   Tetracycline Hcl Other (See Comments)   Tetracyclines & Related Other (See Comments)    Cannot remember reaction Tolerates macrolides (azithromycin)   Ticlopidine Other (See Comments)   Verapamil Other (See Comments)    Pacemaker/CHF   Vicodin [Hydrocodone-Acetaminophen] Other (See Comments)    EXTREME LETHARGY   Latex Itching, Rash and Other (See Comments)     Current Outpatient Medications  Medication Sig Dispense Refill   albuterol (PROVENTIL HFA;VENTOLIN HFA) 108 (90 BASE) MCG/ACT inhaler Inhale 2 puffs into the lungs every 4 (four) hours as needed for wheezing or shortness of breath.     Alcohol Swabs (B-D SINGLE USE SWABS REGULAR) PADS      ALPRAZolam (XANAX) 1 MG tablet Take 1 mg by mouth 3 (three) times daily.   2   Artificial Tear GEL Place 2 drops into both eyes daily as needed (dry eyes).      aspirin EC 81 MG tablet Take 4 tablets (325 mg total) by mouth daily. 120 tablet 11   BIOTIN PO Take 1 tablet by mouth daily.  cetirizine (ZYRTEC) 10 MG tablet Take 10 mg by mouth daily.     Cholecalciferol (VITAMIN D-3) 1000 UNITS CAPS Take 1,000 Units by mouth daily.      clopidogrel (PLAVIX) 75 MG tablet TAKE 1 TABLET EVERY DAY 90 tablet 1   doxazosin (CARDURA) 4 MG tablet Take 4 mg by mouth at bedtime.      DROPLET PEN NEEDLES 32G X 4 MM MISC      EPINEPHrine 0.3 mg/0.3 mL IJ SOAJ injection Inject 0.3 mg into the muscle as needed for anaphylaxis.   3   ezetimibe (ZETIA) 10 MG tablet TAKE 1 TABLET EVERY DAY 90 tablet 1   Flaxseed, Linseed, (FLAXSEED OIL) 1200 MG CAPS Take 1,200 mg by mouth daily.      fluticasone (FLONASE) 50 MCG/ACT nasal spray Place 2 sprays into both nostrils daily as needed for allergies or rhinitis.     furosemide (LASIX) 80 MG tablet TAKE 1 TABLET TWICE A DAY. MAY TAKE AN ADDITIONAL 80MG  (1 TABLET) AS NEEDED FOR EDEMA (Patient taking differently: Take 160 mg by mouth daily. TAKE 1 TABLET TWICE A DAY. MAY TAKE AN ADDITIONAL 80MG  (1 TABLET) AS NEEDED FOR EDEMA) 270 tablet 1   hydrALAZINE (APRESOLINE) 25 MG tablet TAKE 1 TABLET TWICE DAILY 180 tablet 1   isosorbide mononitrate (IMDUR) 60 MG 24 hr tablet TAKE 1 TABLET EVERY DAY 90 tablet 1   Ketotifen Fumarate (ALAWAY OP) Place 1 drop into both eyes daily as needed (allergies).      [Paused] LANTUS SOLOSTAR 100 UNIT/ML Solostar Pen Inject 6 Units into the skin at bedtime.     metoprolol succinate (TOPROL-XL) 100 MG 24 hr tablet TAKE 2 TABLETS EVERY DAY WITH OR IMMEDIATELY FOLLOWING A MEAL 180 tablet 1   metoprolol tartrate (LOPRESSOR) 25 MG tablet Take 1 tablet (25 mg total) by mouth every 6 (six) hours as needed (palpitations). 30 tablet 3   Multiple Vitamin (MULTIVITAMIN WITH MINERALS) TABS Take 1 tablet by mouth daily.     nitroGLYCERIN (NITROSTAT) 0.4 MG SL tablet Place 1 tablet under the tongue every 5 minutes as needed for chest pain, max 3 doses, go to er if no relief 75 tablet 2   olmesartan (BENICAR) 40 MG tablet Take 1 tablet (40 mg total) by mouth daily. 90 tablet 2   OZEMPIC, 0.25 OR 0.5 MG/DOSE, 2 MG/1.5ML SOPN Inject 0.25 mg into the skin once a week. Sunday     pantoprazole (PROTONIX) 40 MG tablet TAKE 1 TABLET BY MOUTH  DAILY 90 tablet 3   potassium chloride (KLOR-CON M) 10 MEQ tablet TAKE 2 TABLETS TWICE A DAY (KEEP MD APPOINTMENT FOR REFILLS) (Patient taking differently: Take 20 mEq by mouth daily. TAKE 2 TABLETS TWICE A DAY (KEEP MD APPOINTMENT FOR REFILLS)) 360 tablet 3   simvastatin (ZOCOR) 40 MG tablet Take 1 tablet (40 mg total) by mouth at bedtime. (Patient taking differently: Take 40 mg by mouth daily.) 90  tablet 2   sodium chloride (MURO 128) 5 % ophthalmic ointment Place 1 drop into both eyes at bedtime as needed for eye irritation. For dry eyes     traMADol (ULTRAM) 50 MG tablet Take 100 mg by mouth every 6 (six) hours as needed (MIGRAINES).     TRUE METRIX BLOOD GLUCOSE TEST test strip      TRUEplus Lancets 33G MISC      venlafaxine (EFFEXOR) 100 MG tablet Take 100-200 mg by mouth See admin instructions. TAKE 200 mg by mouth in the  morning and take 100 mg at noon     vitamin A 7500 UNIT capsule Take 2,400 Units by mouth daily.     No current facility-administered medications for this visit.     Past Medical History:  Diagnosis Date   AICD (automatic cardioverter/defibrillator) present    [employer]- Dr. Rosette Reveal follows   Anginal pain (HCC)    Anxiety    Asthma    Back pain    "pinched nerve-lower back" - Dr. Ethelene Hal follows.   Biventricular implantable cardioverter-defibrillator in situ 06/18/2011   Cerebral infarction (HCC) 11/11/2012   Cervical dysplasia    Chronic diastolic heart failure (HCC) 03/22/2016   Chronic kidney disease    Dr. Allena Katz follows.   Chronic renal insufficiency, stage III (moderate) (HCC) 11/11/2012   Complication of anesthesia    "I wake up during surgeries" (02/14/2013)   Coronary artery disease involving coronary bypass graft of native heart with angina pectoris (HCC) 06/18/2011   Depression    DM (diabetes mellitus) (HCC) 11/11/2012   Fibroid    Function kidney decreased    GERD (gastroesophageal reflux disease)    Hemiplegia, unspecified, affecting nondominant side 11/11/2012   Hepatitis    Hepatitis A -college yrs"water source exposure"   Hiatal hernia    History of shingles    2-3 yrs ago last out break "around waist"   History of stomach ulcers    Hyperlipidemia 07/30/2016   Hypertension    ICD (implantable cardiac defibrillator) in place    Iron deficiency anemia    Ischemic cardiomyopathy    status post biventricular ICD placed by DR Edumunds who  used to see Dr Elsie Lincoln here to establish  cardiovascular care.   Migraines    MVP (mitral valve prolapse)    Antibiotics not required for procedures   Myocardial infarction (HCC)    "I've had 2; the others they were able to catch before completing" (02/14/2013)   Pacemaker    Paroxysmal SVT (supraventricular tachycardia) (HCC) 06/18/2011   Pneumonia 1950's & 1985   Shortness of breath    "lying down flat; at times w/exertion" (02/14/2013). 10-06-15 exertion only..   Stroke [facility])    "2 confirmed; 9 TIA's; results in dragging LLE; numbness in tip of tongue" (02/14/2013),10-06-15 right hand tends to be weaker when tired.   TIA (transient ischemic attack) 11/11/2012   Type II diabetes mellitus (HCC)     ROS:   All systems reviewed and negative except as noted in the HPI.   Past Surgical History:  Procedure Laterality Date   ABDOMINAL HYSTERECTOMY  1985   TAH    BIV ICD GENERTAOR CHANGE OUT  06/2007; 04/2008   "2 lead initial placement, at [facility]; done at [facility], after developing CHF" (02/14/2013)   BIV PACEMAKER GENERATOR CHANGE OUT N/A 01/29/2015   Procedure: BIV PACEMAKER GENERATOR CHANGE OUT;  Surgeon: Marinus Maw, MD;  Location: [facility] CATH LAB;  Service: Cardiovascular;  Laterality: N/A;   BREAST EXCISIONAL BIOPSY Left 01/2007; 06/2007; 03/2008   "benign" (02/14/2013)   BREAST SURGERY     CARDIAC CATHETERIZATION     "probably in the teens" (02/14/2013)   CARDIAC DEFIBRILLATOR PLACEMENT     COLONOSCOPY  ~ 2002   COLONOSCOPY WITH PROPOFOL N/A 10/07/2015   Procedure: COLONOSCOPY WITH PROPOFOL;  Surgeon: Charna Elizabeth, MD;  Location: [facility] ENDOSCOPY;  Service: Endoscopy;  Laterality: N/A;   CORONARY ANGIOPLASTY WITH STENT PLACEMENT     "started out w/5; bypass corrected some; 1 stent since the bypass" (  02/14/2013)   CORONARY ARTERY BYPASS GRAFT  ` 1998   LIMA-LAD, SVG-D1, SVG-PDA   DILATION AND CURETTAGE OF UTERUS  1975 X 2; 1976; 1977   INSERT / REPLACE / REMOVE PACEMAKER     biventricular  defibrillator--06/10/ 2009   LEFT HEART CATH AND CORS/GRAFTS ANGIOGRAPHY N/A 08/04/2018   Procedure: LEFT HEART CATH AND CORS/GRAFTS ANGIOGRAPHY;  Surgeon: Kathleene Hazel, MD;  Location: [facility] INVASIVE CV LAB;  Service: Cardiovascular;  Laterality: N/A;   LEFT HEART CATHETERIZATION WITH CORONARY/GRAFT ANGIOGRAM N/A 01/03/2015   Procedure: LEFT HEART CATHETERIZATION WITH Isabel Caprice;  Surgeon: Lesleigh Noe, MD;  Location: [facility] CATH LAB;  Service: Cardiovascular;  Laterality: N/A;   SUPRAVENTRICULAR TACHYCARDIA ABLATION  06/2007   SVT ABLATION N/A 11/04/2023   Procedure: SVT ABLATION;  Surgeon: Marinus Maw, MD;  Location: [facility] INVASIVE CV LAB;  Service: Cardiovascular;  Laterality: N/A;   TEE WITHOUT CARDIOVERSION  11/14/2012   Procedure: TRANSESOPHAGEAL ECHOCARDIOGRAM (TEE);  Surgeon: Donato Schultz, MD;  Location: [facility] ENDOSCOPY;  Service: Cardiovascular;  Laterality: N/A;     Family History  Problem Relation Age of Onset   Heart disease Mother    Hypertension Mother    Heart attack Mother    Stroke Mother    Heart disease Father    Hypertension Father    Diabetes Father    Kidney failure Father    Heart attack Father    Stroke Father    Heart disease Brother    Diabetes Brother    Kidney failure Brother    Heart attack Brother    Diabetes Paternal Grandmother      Social History   Socioeconomic History   Marital status: Married    Spouse name: [name]   Number of children: 1   Years of education: Not on file   Highest education level: Master's degree (e.g., MA, MS, MEng, MEd, MSW, MBA)  Occupational History    Employer: UNEMPLOYED    Comment: Disablity  Tobacco Use   Smoking status: Never   Smokeless tobacco: Never  Vaping Use   Vaping status: Never Used  Substance and Sexual Activity   Alcohol use: No    Alcohol/week: 0.0 standard drinks of alcohol   Drug use: No   Sexual activity: Yes    Birth control/protection: Surgical  Other Topics Concern    Not on file  Social History Narrative   Caffeine Use: none   Social Drivers of Corporate investment banker Strain: Not on file  Food Insecurity: No Food Insecurity (11/04/2023)   Hunger Vital Sign    Worried About Running Out of Food in the Last Year: Never true    Ran Out of Food in the Last Year: Never true  Transportation Needs: No Transportation Needs (11/04/2023)   PRAPARE - Administrator, Civil Service (Medical): No    Lack of Transportation (Non-Medical): No  Physical Activity: Not on file  Stress: Not on file  Social Connections: Not on file  Intimate Partner Violence: Not At Risk (11/04/2023)   Humiliation, Afraid, Rape, and Kick questionnaire    Fear of Current or Ex-Partner: No    Emotionally Abused: No    Physically Abused: No    Sexually Abused: No     BP 104/66   Pulse 73   Ht 5' 3.5" (1.613 m)   Wt 111 lb (50.3 kg)   SpO2 97%   BMI 19.35 kg/m   Physical Exam:  Well appearing 73 yo  man, NAD HEENT: Unremarkable Neck:  No JVD, no thyromegally Lymphatics:  No adenopathy Back:  No CVA tenderness Lungs:  Clear with no wheezes HEART:  Regular rate rhythm, no murmurs, no rubs, no clicks Abd:  soft, positive bowel sounds, no organomegally, no rebound, no guarding Ext:  2 plus pulses, no edema, no cyanosis, no clubbing Skin:  No rashes no nodules Neuro:  CN II through XII intact, motor grossly intact  EKG - nsr with biv pacing  DEVICE  Normal device function.  See PaceArt for details. 1 month to ERI.  Assess/Plan: SVT - she is s/p catheter ablation and is doing well. No additional SVT on her device interrogation. Chronic systolic heart failure - her symptoms are class 2. She will continue her current meds. ICD - her biv device is working normally. She is about a month to [place]. Will plan gen change out in about 6 weeks. I encouraged her to gain weight. Anemia - she is getting iron infusions.   Sharlot Gowda Yeilin Zweber,MD

## 2024-02-09 DIAGNOSIS — I129 Hypertensive chronic kidney disease with stage 1 through stage 4 chronic kidney disease, or unspecified chronic kidney disease: Secondary | ICD-10-CM | POA: Diagnosis not present

## 2024-02-09 DIAGNOSIS — N184 Chronic kidney disease, stage 4 (severe): Secondary | ICD-10-CM | POA: Diagnosis not present

## 2024-02-09 DIAGNOSIS — D631 Anemia in chronic kidney disease: Secondary | ICD-10-CM | POA: Diagnosis not present

## 2024-02-09 DIAGNOSIS — N2581 Secondary hyperparathyroidism of renal origin: Secondary | ICD-10-CM | POA: Diagnosis not present

## 2024-02-12 ENCOUNTER — Telehealth: Payer: Self-pay

## 2024-02-12 NOTE — Telephone Encounter (Signed)
 Patient called in after device made noise she had never heard. Approximately 10 second monotone alarm (unlike many years prior when had "[facility]") alarm. Totally asymptomatic, no chest pain, palpitations, dizziness/LOC or shocks. She hasn't had any arrhythmias on recent interrogations. She is about 1 month from ERI as per recent EP note by Dr. Ladona Ridgel and is AS-VP for CRT purposes. Unlikely to be strong magnet near her to trigger tone- she was closing her car door when occurred, no where new. Asked her to transmit new interrogation to ensure nothing else going on, no new arrhythmia/therapies, etc. Will forward this to EP team.   Emily Relic, MD [facility]  Cardiology

## 2024-02-13 ENCOUNTER — Telehealth: Payer: Self-pay

## 2024-02-13 NOTE — Telephone Encounter (Signed)
 The pt states she heard a loud noise. She sent a transmission. I let her speak with Baird Lyons.

## 2024-02-13 NOTE — Telephone Encounter (Signed)
 Transmission reviewed. No patient notifier delivered. Pt was carrying a tv at the time. Description sounded like magnet response. Explained this to patient.

## 2024-02-14 ENCOUNTER — Encounter: Payer: Medicare HMO | Admitting: Physician Assistant

## 2024-02-15 ENCOUNTER — Other Ambulatory Visit: Payer: Self-pay | Admitting: Physician Assistant

## 2024-02-17 ENCOUNTER — Other Ambulatory Visit: Payer: Self-pay

## 2024-02-17 ENCOUNTER — Ambulatory Visit (HOSPITAL_COMMUNITY)
Admission: RE | Admit: 2024-02-17 | Discharge: 2024-02-17 | Disposition: A | Discharge status: Home / Self Care | Source: Ambulatory Visit | Attending: Nephrology | Admitting: Nephrology

## 2024-02-17 VITALS — BP 122/79 | HR 80 | Temp 97.8°F | Resp 17

## 2024-02-17 DIAGNOSIS — N183 Chronic kidney disease, stage 3 unspecified: Secondary | ICD-10-CM | POA: Diagnosis not present

## 2024-02-17 LAB — POCT HEMOGLOBIN-HEMACUE: Hemoglobin: 11.5 g/dL — ABNORMAL LOW (ref 12.0–15.0)

## 2024-02-17 MED ORDER — METOPROLOL SUCCINATE ER 100 MG PO TB24: 200.0000 mg | ORAL_TABLET | Freq: Every day | ORAL | 0 refills | Status: DC

## 2024-02-17 MED ORDER — ISOSORBIDE MONONITRATE ER 60 MG PO TB24: 60.0000 mg | ORAL_TABLET | Freq: Every day | ORAL | 0 refills | Status: DC

## 2024-02-17 MED ORDER — EPOETIN ALFA-EPBX 10000 UNIT/ML IJ SOLN
10000.0000 [IU] | INTRAMUSCULAR | Status: DC
  Administered 2024-02-17: 10000 [IU] via SUBCUTANEOUS

## 2024-02-17 MED ORDER — HYDRALAZINE HCL 25 MG PO TABS: 25.0000 mg | ORAL_TABLET | Freq: Two times a day (BID) | ORAL | 0 refills | Status: DC

## 2024-02-17 MED ORDER — EPOETIN ALFA-EPBX 10000 UNIT/ML IJ SOLN
INTRAMUSCULAR | Status: DC
Start: 2024-02-17 — End: 2024-02-18
  Filled 2024-02-17: qty 1

## 2024-02-17 MED ORDER — FUROSEMIDE 80 MG PO TABS: ORAL_TABLET | ORAL | 0 refills | Status: DC

## 2024-02-17 MED ORDER — CLOPIDOGREL BISULFATE 75 MG PO TABS
75.0000 mg | ORAL_TABLET | Freq: Every day | ORAL | 0 refills | Status: DC
Start: 2024-02-17 — End: 2024-05-07

## 2024-02-17 MED ORDER — EZETIMIBE 10 MG PO TABS: 10.0000 mg | ORAL_TABLET | Freq: Every day | ORAL | 0 refills | Status: DC

## 2024-02-21 ENCOUNTER — Ambulatory Visit: Payer: Medicare HMO

## 2024-02-24 ENCOUNTER — Encounter (HOSPITAL_COMMUNITY)

## 2024-03-01 ENCOUNTER — Ambulatory Visit (INDEPENDENT_AMBULATORY_CARE_PROVIDER_SITE_OTHER)

## 2024-03-01 DIAGNOSIS — I255 Ischemic cardiomyopathy: Secondary | ICD-10-CM

## 2024-03-01 LAB — CUP PACEART REMOTE DEVICE CHECK
Battery Remaining Longevity: 1 mo
Battery Voltage: 2.75 V
Brady Statistic AP VP Percent: 0.03 %
Brady Statistic AP VS Percent: 0.04 %
Brady Statistic AS VP Percent: 98.53 %
Brady Statistic AS VS Percent: 1.4 %
Brady Statistic RA Percent Paced: 0.08 %
Brady Statistic RV Percent Paced: 0.03 %
Date Time Interrogation Session: 20250424073827
HighPow Impedance: 36 Ohm
HighPow Impedance: 45 Ohm
Implantable Lead Connection Status: 753985
Implantable Lead Connection Status: 753985
Implantable Lead Connection Status: 753985
Implantable Lead Implant Date: 20090610
Implantable Lead Implant Date: 20090610
Implantable Lead Implant Date: 20090610
Implantable Lead Location: 753858
Implantable Lead Location: 753859
Implantable Lead Location: 753860
Implantable Lead Model: 4194
Implantable Lead Model: 5076
Implantable Lead Model: 6947
Implantable Pulse Generator Implant Date: 20160323
Lead Channel Impedance Value: 285 Ohm
Lead Channel Impedance Value: 342 Ohm
Lead Channel Impedance Value: 399 Ohm
Lead Channel Impedance Value: 456 Ohm
Lead Channel Impedance Value: 513 Ohm
Lead Channel Impedance Value: 665 Ohm
Lead Channel Pacing Threshold Amplitude: 0.75 V
Lead Channel Pacing Threshold Amplitude: 1 V
Lead Channel Pacing Threshold Amplitude: 1.125 V
Lead Channel Pacing Threshold Pulse Width: 0.4 ms
Lead Channel Pacing Threshold Pulse Width: 0.4 ms
Lead Channel Pacing Threshold Pulse Width: 0.4 ms
Lead Channel Sensing Intrinsic Amplitude: 1.75 mV
Lead Channel Sensing Intrinsic Amplitude: 1.75 mV
Lead Channel Sensing Intrinsic Amplitude: 4.5 mV
Lead Channel Sensing Intrinsic Amplitude: 4.5 mV
Lead Channel Setting Pacing Amplitude: 2 V
Lead Channel Setting Pacing Amplitude: 2.25 V
Lead Channel Setting Pacing Amplitude: 2.25 V
Lead Channel Setting Pacing Pulse Width: 0.4 ms
Lead Channel Setting Pacing Pulse Width: 0.4 ms
Lead Channel Setting Sensing Sensitivity: 0.3 mV
Zone Setting Status: 755011
Zone Setting Status: 755011

## 2024-03-06 ENCOUNTER — Encounter: Payer: Self-pay | Admitting: Internal Medicine

## 2024-03-09 ENCOUNTER — Encounter (HOSPITAL_COMMUNITY)
Admission: RE | Admit: 2024-03-09 | Discharge: 2024-03-09 | Disposition: A | Discharge status: Home / Self Care | Source: Ambulatory Visit | Attending: Nephrology | Admitting: Nephrology

## 2024-03-09 VITALS — BP 130/74 | HR 70 | Temp 98.2°F | Resp 17

## 2024-03-09 DIAGNOSIS — N183 Chronic kidney disease, stage 3 unspecified: Secondary | ICD-10-CM

## 2024-03-09 LAB — IRON AND TIBC
Iron: 125 ug/dL (ref 28–170)
Saturation Ratios: 38 % — ABNORMAL HIGH (ref 10.4–31.8)
TIBC: 333 ug/dL (ref 250–450)
UIBC: 208 ug/dL

## 2024-03-09 LAB — CBC
HCT: 36.3 % (ref 36.0–46.0)
Hemoglobin: 12.2 g/dL (ref 12.0–15.0)
MCH: 30.6 pg (ref 26.0–34.0)
MCHC: 33.6 g/dL (ref 30.0–36.0)
MCV: 91 fL (ref 80.0–100.0)
Platelets: 169 10*3/uL (ref 150–400)
RBC: 3.99 MIL/uL (ref 3.87–5.11)
RDW: 14.9 % (ref 11.5–15.5)
WBC: 5.5 10*3/uL (ref 4.0–10.5)
nRBC: 0 % (ref 0.0–0.2)

## 2024-03-09 LAB — POCT HEMOGLOBIN-HEMACUE: Hemoglobin: 11.4 g/dL — ABNORMAL LOW (ref 12.0–15.0)

## 2024-03-09 LAB — FERRITIN: Ferritin: 415 ng/mL — ABNORMAL HIGH (ref 11–307)

## 2024-03-09 MED ORDER — EPOETIN ALFA 10000 UNIT/ML IJ SOLN
INTRAMUSCULAR | Status: DC
Start: 2024-03-09 — End: 2024-03-10
  Filled 2024-03-09: qty 1

## 2024-03-09 MED ORDER — EPOETIN ALFA 10000 UNIT/ML IJ SOLN
10000.0000 [IU] | Freq: Once | INTRAMUSCULAR | Status: AC
  Administered 2024-03-09: 10000 [IU] via SUBCUTANEOUS

## 2024-03-20 ENCOUNTER — Telehealth: Payer: Self-pay | Admitting: *Deleted

## 2024-03-20 DIAGNOSIS — I255 Ischemic cardiomyopathy: Secondary | ICD-10-CM

## 2024-03-20 NOTE — Telephone Encounter (Signed)
 Alert remote transmission:  Device has reached RRT 03/20/24 - route to triage   Called and notified patient of device RRT Generator change out previously discussed with patient and Dr. Carolynne Citron on last visit 4/8 Routed to EP scheduling to get patient appointment

## 2024-03-20 NOTE — Progress Notes (Signed)
 Remote ICD transmission.

## 2024-03-21 NOTE — Telephone Encounter (Signed)
 Returned patient phone call  Patient clarified they were asking about the alarm going off and not the ICD firing This RN informed the patient that her device alarm will continue to sound once daily until her battery is replaced and that it could be turned off in clinic if it became too annoying Patient declined coming into clinic to turn off alarm at this time All questions answered Patient appreciative for call back

## 2024-03-21 NOTE — Telephone Encounter (Signed)
 Pt called back in, she asked if the "firing" going to continue until the procedure for change out occurs because she said it happened again after they called. Please advise.

## 2024-03-22 NOTE — Addendum Note (Signed)
 Addended by: Mahli Glahn on: 03/22/2024 09:20 AM   Modules accepted: Orders

## 2024-03-22 NOTE — Telephone Encounter (Signed)
 Spoke with pt and scheduled her for BiV-ICD Gen Change with Dr. Carolynne Citron on 6/19 at 9:30 AM. She will come have labs done on 5/23 and pick up surgical scrub and Instruction letter while she is here.   She confirmed that she takes Ozempic on Sundays.   She currently is NOT taking Lantus  but if she starts it back before her procedure she will call and let me know.

## 2024-03-29 ENCOUNTER — Telehealth (HOSPITAL_COMMUNITY): Payer: Self-pay

## 2024-03-29 ENCOUNTER — Encounter: Payer: Self-pay | Admitting: Cardiovascular Disease

## 2024-03-29 DIAGNOSIS — Z7682 Awaiting organ transplant status: Secondary | ICD-10-CM | POA: Diagnosis not present

## 2024-03-29 NOTE — Telephone Encounter (Signed)
 Spoke with patient to complete pre-procedure call.     New medical conditions?  No Recent hospitalizations or surgeries? No Started any new medications? No Patient made aware to contact office to inform of any new medications started. Any changes in activities of daily living? No  Pre-procedure testing scheduled: lab work on 03/30/24.  HOLD: Semaglutide for 7 days prior to the procedure. Last dose on Sunday, June 08.   HOLD: Aspirin  for 2 days prior to the procedure. Last dose on Monday, June 16.  HOLD: Plavix  for 4 days prior to the procedure. Last dose on Saturday, June 14.   Confirmed patient is scheduled for  ICD generator change on Thursday, June 19 with Dr. Manya Sells. Instructed patient to arrive at the Main Entrance A at [facility]: [address] [place], [place] [address] and check in at Admitting at 7:30 AM.  Patient verbalized that she lives alone and plans to stay overnight.   Patient verbalized understanding to information provided and is agreeable to proceed with procedure.

## 2024-03-30 ENCOUNTER — Ambulatory Visit (HOSPITAL_COMMUNITY)
Admission: RE | Admit: 2024-03-30 | Discharge: 2024-03-30 | Disposition: A | Discharge status: Home / Self Care | Source: Ambulatory Visit | Attending: Nephrology | Admitting: Nephrology

## 2024-03-30 VITALS — BP 122/82 | HR 79 | Temp 97.1°F | Resp 16

## 2024-03-30 DIAGNOSIS — N183 Chronic kidney disease, stage 3 unspecified: Secondary | ICD-10-CM | POA: Insufficient documentation

## 2024-03-30 DIAGNOSIS — I255 Ischemic cardiomyopathy: Secondary | ICD-10-CM | POA: Diagnosis not present

## 2024-03-30 LAB — IRON AND TIBC
Iron: 131 ug/dL (ref 28–170)
Saturation Ratios: 39 % — ABNORMAL HIGH (ref 10.4–31.8)
TIBC: 335 ug/dL (ref 250–450)
UIBC: 204 ug/dL

## 2024-03-30 LAB — CBC
HCT: 38.9 % (ref 36.0–46.0)
Hemoglobin: 13.2 g/dL (ref 12.0–15.0)
MCH: 30.9 pg (ref 26.0–34.0)
MCHC: 33.9 g/dL (ref 30.0–36.0)
MCV: 91.1 fL (ref 80.0–100.0)
Platelets: 177 10*3/uL (ref 150–400)
RBC: 4.27 MIL/uL (ref 3.87–5.11)
RDW: 14.5 % (ref 11.5–15.5)
WBC: 5.7 10*3/uL (ref 4.0–10.5)
nRBC: 0 % (ref 0.0–0.2)

## 2024-03-30 LAB — FERRITIN: Ferritin: 329 ng/mL — ABNORMAL HIGH (ref 11–307)

## 2024-03-30 MED ORDER — EPOETIN ALFA-EPBX 10000 UNIT/ML IJ SOLN
10000.0000 [IU] | Freq: Once | INTRAMUSCULAR | Status: AC
  Administered 2024-03-30: 10000 [IU] via SUBCUTANEOUS

## 2024-03-30 MED ORDER — EPOETIN ALFA-EPBX 10000 UNIT/ML IJ SOLN
INTRAMUSCULAR | Status: AC
  Filled 2024-03-30: qty 1

## 2024-03-31 LAB — BASIC METABOLIC PANEL WITH GFR
BUN/Creatinine Ratio: 17 (ref 12–28)
BUN: 49 mg/dL — ABNORMAL HIGH (ref 8–27)
CO2: 26 mmol/L (ref 20–29)
Calcium: 9.5 mg/dL (ref 8.7–10.3)
Chloride: 99 mmol/L (ref 96–106)
Creatinine, Ser: 2.91 mg/dL — ABNORMAL HIGH (ref 0.57–1.00)
Glucose: 67 mg/dL — ABNORMAL LOW (ref 70–99)
Potassium: 4.5 mmol/L (ref 3.5–5.2)
Sodium: 145 mmol/L — ABNORMAL HIGH (ref 134–144)
eGFR: 17 mL/min/{1.73_m2} — ABNORMAL LOW (ref >59)

## 2024-03-31 LAB — CBC
Hematocrit: 40.9 % (ref 34.0–46.6)
Hemoglobin: 13.1 g/dL (ref 11.1–15.9)
MCH: 30.8 pg (ref 26.6–33.0)
MCHC: 32 g/dL (ref 31.5–35.7)
MCV: 96 fL (ref 79–97)
Platelets: 171 10*3/uL (ref 150–450)
RBC: 4.25 x10E6/uL (ref 3.77–5.28)
RDW: 14.1 % (ref 11.7–15.4)
WBC: 6 10*3/uL (ref 3.4–10.8)

## 2024-04-03 ENCOUNTER — Ambulatory Visit (INDEPENDENT_AMBULATORY_CARE_PROVIDER_SITE_OTHER)

## 2024-04-03 DIAGNOSIS — I5022 Chronic systolic (congestive) heart failure: Secondary | ICD-10-CM

## 2024-04-03 DIAGNOSIS — I255 Ischemic cardiomyopathy: Secondary | ICD-10-CM

## 2024-04-03 LAB — CUP PACEART REMOTE DEVICE CHECK
Battery Remaining Longevity: 1 mo
Battery Voltage: 2.72 V
Brady Statistic AP VP Percent: 0.04 %
Brady Statistic AP VS Percent: 0.04 %
Brady Statistic AS VP Percent: 98.49 %
Brady Statistic AS VS Percent: 1.43 %
Brady Statistic RA Percent Paced: 0.08 %
Brady Statistic RV Percent Paced: 0.01 %
Date Time Interrogation Session: 20250527012505
HighPow Impedance: 38 Ohm
HighPow Impedance: 50 Ohm
Implantable Lead Connection Status: 753985
Implantable Lead Connection Status: 753985
Implantable Lead Connection Status: 753985
Implantable Lead Implant Date: 20090610
Implantable Lead Implant Date: 20090610
Implantable Lead Implant Date: 20090610
Implantable Lead Location: 753858
Implantable Lead Location: 753859
Implantable Lead Location: 753860
Implantable Lead Model: 4194
Implantable Lead Model: 5076
Implantable Lead Model: 6947
Implantable Pulse Generator Implant Date: 20160323
Lead Channel Impedance Value: 285 Ohm
Lead Channel Impedance Value: 361 Ohm
Lead Channel Impedance Value: 456 Ohm
Lead Channel Impedance Value: 475 Ohm
Lead Channel Impedance Value: 532 Ohm
Lead Channel Impedance Value: 665 Ohm
Lead Channel Pacing Threshold Amplitude: 0.625 V
Lead Channel Pacing Threshold Amplitude: 1 V
Lead Channel Pacing Threshold Amplitude: 1.125 V
Lead Channel Pacing Threshold Pulse Width: 0.4 ms
Lead Channel Pacing Threshold Pulse Width: 0.4 ms
Lead Channel Pacing Threshold Pulse Width: 0.4 ms
Lead Channel Sensing Intrinsic Amplitude: 1.875 mV
Lead Channel Sensing Intrinsic Amplitude: 1.875 mV
Lead Channel Sensing Intrinsic Amplitude: 4.5 mV
Lead Channel Sensing Intrinsic Amplitude: 4.5 mV
Lead Channel Setting Pacing Amplitude: 2 V
Lead Channel Setting Pacing Amplitude: 2 V
Lead Channel Setting Pacing Amplitude: 2.25 V
Lead Channel Setting Pacing Pulse Width: 0.4 ms
Lead Channel Setting Pacing Pulse Width: 0.4 ms
Lead Channel Setting Sensing Sensitivity: 0.3 mV
Zone Setting Status: 755011
Zone Setting Status: 755011

## 2024-04-03 LAB — POCT HEMOGLOBIN-HEMACUE: Hemoglobin: 10 g/dL — ABNORMAL LOW (ref 12.0–15.0)

## 2024-04-04 ENCOUNTER — Telehealth (HOSPITAL_COMMUNITY): Payer: Self-pay

## 2024-04-04 ENCOUNTER — Ambulatory Visit: Payer: Self-pay | Admitting: Internal Medicine

## 2024-04-04 NOTE — Telephone Encounter (Signed)
 Received a call from patient concerned about holding ASA and Plavix  for several days per instructions, prior to BIV ICD generator change on June 19. States the last time she stopped both medications for several days, she had a TIA and wants to verify if this is OK. Recalls she did not stop medications with her last generator change in 2016. Please advise, thanks.

## 2024-04-05 NOTE — Telephone Encounter (Signed)
 Per Dr Carolynne Citron, Hold the plavix  but continue taking the ASA.

## 2024-04-05 NOTE — Telephone Encounter (Signed)
 Spoke with patient and informed of provider recommendations. Advised her to hold Plavix  for 4 days prior to the procedure. Last dose on Saturday, June 14 and continue taking Aspirin . She verbalized understanding and appreciation.

## 2024-04-11 NOTE — Progress Notes (Signed)
 Remote ICD transmission.

## 2024-04-16 DIAGNOSIS — F4381 Prolonged grief disorder: Secondary | ICD-10-CM | POA: Diagnosis not present

## 2024-04-16 DIAGNOSIS — F331 Major depressive disorder, recurrent, moderate: Secondary | ICD-10-CM | POA: Diagnosis not present

## 2024-04-16 DIAGNOSIS — F411 Generalized anxiety disorder: Secondary | ICD-10-CM | POA: Diagnosis not present

## 2024-04-17 DIAGNOSIS — R935 Abnormal findings on diagnostic imaging of other abdominal regions, including retroperitoneum: Secondary | ICD-10-CM | POA: Diagnosis not present

## 2024-04-17 DIAGNOSIS — G459 Transient cerebral ischemic attack, unspecified: Secondary | ICD-10-CM | POA: Diagnosis not present

## 2024-04-17 DIAGNOSIS — I10 Essential (primary) hypertension: Secondary | ICD-10-CM | POA: Diagnosis not present

## 2024-04-17 DIAGNOSIS — Z0183 Encounter for blood typing: Secondary | ICD-10-CM | POA: Diagnosis not present

## 2024-04-17 DIAGNOSIS — I700 Atherosclerosis of aorta: Secondary | ICD-10-CM | POA: Diagnosis not present

## 2024-04-17 DIAGNOSIS — Z7682 Awaiting organ transplant status: Secondary | ICD-10-CM | POA: Diagnosis not present

## 2024-04-17 DIAGNOSIS — Z794 Long term (current) use of insulin: Secondary | ICD-10-CM | POA: Diagnosis not present

## 2024-04-17 DIAGNOSIS — E1122 Type 2 diabetes mellitus with diabetic chronic kidney disease: Secondary | ICD-10-CM | POA: Diagnosis not present

## 2024-04-17 DIAGNOSIS — E119 Type 2 diabetes mellitus without complications: Secondary | ICD-10-CM | POA: Diagnosis not present

## 2024-04-17 DIAGNOSIS — Z95810 Presence of automatic (implantable) cardiac defibrillator: Secondary | ICD-10-CM | POA: Diagnosis not present

## 2024-04-17 DIAGNOSIS — Z01818 Encounter for other preprocedural examination: Secondary | ICD-10-CM | POA: Diagnosis not present

## 2024-04-17 DIAGNOSIS — N184 Chronic kidney disease, stage 4 (severe): Secondary | ICD-10-CM | POA: Diagnosis not present

## 2024-04-17 DIAGNOSIS — I25709 Atherosclerosis of coronary artery bypass graft(s), unspecified, with unspecified angina pectoris: Secondary | ICD-10-CM | POA: Diagnosis not present

## 2024-04-17 DIAGNOSIS — Z114 Encounter for screening for human immunodeficiency virus [HIV]: Secondary | ICD-10-CM | POA: Diagnosis not present

## 2024-04-17 DIAGNOSIS — Z1159 Encounter for screening for other viral diseases: Secondary | ICD-10-CM | POA: Diagnosis not present

## 2024-04-18 ENCOUNTER — Telehealth (HOSPITAL_COMMUNITY): Payer: Self-pay

## 2024-04-18 NOTE — Telephone Encounter (Signed)
 Spoke with patient to discuss upcoming procedure.   Confirmed patient is scheduled for a ICD generator change on Thursday, June 19 with Dr. Manya Sells. Instructed patient to arrive at the Main Entrance A at [facility]: [address] [place], [place] [address] and check in at Admitting at 7:30 AM.   Labs completed  Any recent signs of acute illness or been started on antibiotics?  No Any new medications started? No Any medications to hold? Hold Semaglutide for 7 days prior to the procedure- last dose on June 08. Hold Plavix  for 4 days prior to the procedure- last dose on June 14. As previously discussed, per Dr. Carolynne Citron, you may continue ASA. Medication instructions:  On the morning of your procedure DO NOT take any medication. No eating or drinking after midnight prior to procedure.   The night before your procedure and the morning of your procedure, wash thoroughly with the CHG surgical soap from the neck down, paying special attention to the area where your procedure will be performed.  Patient verbalized that she lives alone and plans to stay overnight.   Patient verbalized understanding to all instructions provided and agreed to proceed with procedure.

## 2024-04-20 ENCOUNTER — Encounter (HOSPITAL_COMMUNITY)
Admission: RE | Admit: 2024-04-20 | Discharge: 2024-04-20 | Disposition: A | Discharge status: Home / Self Care | Source: Ambulatory Visit | Attending: Nephrology | Admitting: Nephrology

## 2024-04-20 ENCOUNTER — Encounter: Payer: Self-pay | Admitting: Emergency Medicine

## 2024-04-20 VITALS — BP 117/81 | HR 72 | Temp 97.3°F | Resp 16

## 2024-04-20 DIAGNOSIS — Z950 Presence of cardiac pacemaker: Secondary | ICD-10-CM | POA: Diagnosis not present

## 2024-04-20 DIAGNOSIS — N183 Chronic kidney disease, stage 3 unspecified: Secondary | ICD-10-CM | POA: Diagnosis not present

## 2024-04-20 LAB — IRON AND TIBC
Iron: 106 ug/dL (ref 28–170)
Saturation Ratios: 32 % — ABNORMAL HIGH (ref 10.4–31.8)
TIBC: 335 ug/dL (ref 250–450)
UIBC: 229 ug/dL

## 2024-04-20 LAB — CBC
HCT: 36 % (ref 36.0–46.0)
Hemoglobin: 12.3 g/dL (ref 12.0–15.0)
MCH: 31.6 pg (ref 26.0–34.0)
MCHC: 34.2 g/dL (ref 30.0–36.0)
MCV: 92.5 fL (ref 80.0–100.0)
Platelets: 173 10*3/uL (ref 150–400)
RBC: 3.89 MIL/uL (ref 3.87–5.11)
RDW: 14 % (ref 11.5–15.5)
WBC: 6 10*3/uL (ref 4.0–10.5)
nRBC: 0 % (ref 0.0–0.2)

## 2024-04-20 LAB — POCT HEMOGLOBIN-HEMACUE: Hemoglobin: 11.7 g/dL — ABNORMAL LOW (ref 12.0–15.0)

## 2024-04-20 LAB — FERRITIN: Ferritin: 277 ng/mL (ref 11–307)

## 2024-04-20 MED ORDER — EPOETIN ALFA-EPBX 10000 UNIT/ML IJ SOLN
INTRAMUSCULAR | Status: AC
Start: 2024-04-20 — End: 2024-04-20
  Filled 2024-04-20: qty 1

## 2024-04-20 MED ORDER — EPOETIN ALFA-EPBX 10000 UNIT/ML IJ SOLN
10000.0000 [IU] | Freq: Once | INTRAMUSCULAR | Status: AC
  Administered 2024-04-20: 10000 [IU] via SUBCUTANEOUS

## 2024-04-24 DIAGNOSIS — N184 Chronic kidney disease, stage 4 (severe): Secondary | ICD-10-CM | POA: Diagnosis not present

## 2024-04-24 DIAGNOSIS — I10 Essential (primary) hypertension: Secondary | ICD-10-CM | POA: Diagnosis not present

## 2024-04-24 DIAGNOSIS — I2510 Atherosclerotic heart disease of native coronary artery without angina pectoris: Secondary | ICD-10-CM | POA: Diagnosis not present

## 2024-04-24 DIAGNOSIS — I5022 Chronic systolic (congestive) heart failure: Secondary | ICD-10-CM | POA: Diagnosis not present

## 2024-04-26 ENCOUNTER — Other Ambulatory Visit: Payer: Self-pay

## 2024-04-26 ENCOUNTER — Encounter (HOSPITAL_COMMUNITY): Payer: Self-pay | Admitting: Internal Medicine

## 2024-04-26 ENCOUNTER — Observation Stay (HOSPITAL_COMMUNITY)
Admission: RE | Admit: 2024-04-26 | Discharge: 2024-04-28 | Disposition: A | Discharge status: Home / Self Care | Attending: Internal Medicine | Admitting: Internal Medicine

## 2024-04-26 ENCOUNTER — Ambulatory Visit (HOSPITAL_COMMUNITY)
Admission: RE | Disposition: A | Discharge status: Home / Self Care | Payer: Self-pay | Source: Home / Self Care | Attending: Internal Medicine

## 2024-04-26 DIAGNOSIS — I447 Left bundle-branch block, unspecified: Secondary | ICD-10-CM | POA: Insufficient documentation

## 2024-04-26 DIAGNOSIS — I130 Hypertensive heart and chronic kidney disease with heart failure and stage 1 through stage 4 chronic kidney disease, or unspecified chronic kidney disease: Secondary | ICD-10-CM | POA: Insufficient documentation

## 2024-04-26 DIAGNOSIS — Z7902 Long term (current) use of antithrombotics/antiplatelets: Secondary | ICD-10-CM | POA: Insufficient documentation

## 2024-04-26 DIAGNOSIS — Z79899 Other long term (current) drug therapy: Secondary | ICD-10-CM | POA: Diagnosis not present

## 2024-04-26 DIAGNOSIS — E1122 Type 2 diabetes mellitus with diabetic chronic kidney disease: Secondary | ICD-10-CM | POA: Diagnosis not present

## 2024-04-26 DIAGNOSIS — Z8673 Personal history of transient ischemic attack (TIA), and cerebral infarction without residual deficits: Secondary | ICD-10-CM | POA: Diagnosis not present

## 2024-04-26 DIAGNOSIS — Z95810 Presence of automatic (implantable) cardiac defibrillator: Principal | ICD-10-CM | POA: Diagnosis present

## 2024-04-26 DIAGNOSIS — I5032 Chronic diastolic (congestive) heart failure: Secondary | ICD-10-CM | POA: Insufficient documentation

## 2024-04-26 DIAGNOSIS — N183 Chronic kidney disease, stage 3 unspecified: Secondary | ICD-10-CM | POA: Insufficient documentation

## 2024-04-26 DIAGNOSIS — I2510 Atherosclerotic heart disease of native coronary artery without angina pectoris: Principal | ICD-10-CM | POA: Insufficient documentation

## 2024-04-26 DIAGNOSIS — Z45010 Encounter for checking and testing of cardiac pacemaker pulse generator [battery]: Secondary | ICD-10-CM

## 2024-04-26 DIAGNOSIS — J45909 Unspecified asthma, uncomplicated: Secondary | ICD-10-CM | POA: Insufficient documentation

## 2024-04-26 DIAGNOSIS — Z91040 Latex allergy status: Secondary | ICD-10-CM | POA: Diagnosis not present

## 2024-04-26 DIAGNOSIS — Z7982 Long term (current) use of aspirin: Secondary | ICD-10-CM | POA: Diagnosis not present

## 2024-04-26 DIAGNOSIS — Z4502 Encounter for adjustment and management of automatic implantable cardiac defibrillator: Principal | ICD-10-CM

## 2024-04-26 DIAGNOSIS — N189 Chronic kidney disease, unspecified: Secondary | ICD-10-CM | POA: Insufficient documentation

## 2024-04-26 DIAGNOSIS — Z955 Presence of coronary angioplasty implant and graft: Secondary | ICD-10-CM | POA: Diagnosis not present

## 2024-04-26 HISTORY — PX: BIV ICD GENERATOR CHANGEOUT: EP1194

## 2024-04-26 LAB — GLUCOSE, CAPILLARY
Glucose-Capillary: 105 mg/dL — ABNORMAL HIGH (ref 70–99)
Glucose-Capillary: 149 mg/dL — ABNORMAL HIGH (ref 70–99)

## 2024-04-26 SURGERY — BIV ICD GENERATOR CHANGEOUT

## 2024-04-26 MED ORDER — FENTANYL CITRATE (PF) 100 MCG/2ML IJ SOLN
INTRAMUSCULAR | Status: AC
Start: 2024-04-26 — End: 2024-04-26
  Filled 2024-04-26: qty 2

## 2024-04-26 MED ORDER — ACETAMINOPHEN 325 MG PO TABS
325.0000 mg | ORAL_TABLET | ORAL | Status: DC | PRN
  Administered 2024-04-26 (×2): 650 mg via ORAL
  Filled 2024-04-26 (×2): qty 2

## 2024-04-26 MED ORDER — SODIUM CHLORIDE 0.9 % IV SOLN
INTRAVENOUS | Status: AC
  Filled 2024-04-26: qty 2

## 2024-04-26 MED ORDER — CHLORHEXIDINE GLUCONATE 4 % EX SOLN: 4.0000 | Freq: Once | CUTANEOUS | Status: DC

## 2024-04-26 MED ORDER — FENTANYL CITRATE (PF) 100 MCG/2ML IJ SOLN
INTRAMUSCULAR | Status: DC | PRN
  Administered 2024-04-26 (×6): 12.5 ug via INTRAVENOUS
  Administered 2024-04-26: 25 ug via INTRAVENOUS

## 2024-04-26 MED ORDER — CEFAZOLIN SODIUM-DEXTROSE 2-4 GM/100ML-% IV SOLN
INTRAVENOUS | Status: AC
  Filled 2024-04-26: qty 100

## 2024-04-26 MED ORDER — MIDAZOLAM HCL 2 MG/2ML IJ SOLN
INTRAMUSCULAR | Status: AC
Start: 2024-04-26 — End: 2024-04-26
  Filled 2024-04-26: qty 2

## 2024-04-26 MED ORDER — MIDAZOLAM HCL 2 MG/2ML IJ SOLN
INTRAMUSCULAR | Status: AC
  Filled 2024-04-26: qty 2

## 2024-04-26 MED ORDER — SODIUM CHLORIDE 0.9 % IV SOLN
INTRAVENOUS | Status: DC | PRN
  Administered 2024-04-26 (×2): 80 mg

## 2024-04-26 MED ORDER — SODIUM CHLORIDE 0.9 % IV SOLN: 80.0000 mg | INTRAVENOUS | Status: DC

## 2024-04-26 MED ORDER — VANCOMYCIN HCL IN DEXTROSE 1-5 GM/200ML-% IV SOLN
INTRAVENOUS | Status: AC
  Administered 2024-04-26: 1000 mg
  Filled 2024-04-26: qty 200

## 2024-04-26 MED ORDER — VANCOMYCIN HCL IN DEXTROSE 1-5 GM/200ML-% IV SOLN
1000.0000 mg | Freq: Once | INTRAVENOUS | Status: AC
Start: 2024-04-27 — End: 2024-04-28
  Administered 2024-04-27: 1000 mg via INTRAVENOUS
  Filled 2024-04-26: qty 200

## 2024-04-26 MED ORDER — TRAMADOL HCL 50 MG PO TABS
100.0000 mg | ORAL_TABLET | Freq: Four times a day (QID) | ORAL | Status: DC | PRN
  Administered 2024-04-26 – 2024-04-28 (×8): 100 mg via ORAL
  Filled 2024-04-26 (×8): qty 2

## 2024-04-26 MED ORDER — ACETAMINOPHEN 325 MG PO TABS
ORAL_TABLET | ORAL | Status: AC
  Filled 2024-04-26: qty 2

## 2024-04-26 MED ORDER — SODIUM CHLORIDE 0.9 % IV SOLN: INTRAVENOUS | Status: DC

## 2024-04-26 MED ORDER — CEFAZOLIN SODIUM-DEXTROSE 2-4 GM/100ML-% IV SOLN: 2.0000 g | INTRAVENOUS | Status: DC

## 2024-04-26 MED ORDER — ALPRAZOLAM 0.5 MG PO TABS
1.0000 mg | ORAL_TABLET | Freq: Three times a day (TID) | ORAL | Status: DC | PRN
  Administered 2024-04-26 – 2024-04-28 (×5): 1 mg via ORAL
  Filled 2024-04-26 (×5): qty 2

## 2024-04-26 MED ORDER — LIDOCAINE HCL (PF) 1 % IJ SOLN
INTRAMUSCULAR | Status: AC
  Filled 2024-04-26: qty 60

## 2024-04-26 MED ORDER — VANCOMYCIN HCL 10 G IV SOLR: INTRAVENOUS | Status: DC | PRN

## 2024-04-26 MED ORDER — FENTANYL CITRATE (PF) 100 MCG/2ML IJ SOLN
INTRAMUSCULAR | Status: AC
  Filled 2024-04-26: qty 2

## 2024-04-26 MED ORDER — ONDANSETRON HCL 4 MG/2ML IJ SOLN: 4.0000 mg | Freq: Four times a day (QID) | INTRAMUSCULAR | Status: DC | PRN

## 2024-04-26 MED ORDER — LIDOCAINE HCL (PF) 1 % IJ SOLN
INTRAMUSCULAR | Status: DC | PRN
Start: 2024-04-26 — End: 2024-04-26
  Administered 2024-04-26: 30 mL

## 2024-04-26 MED ORDER — MIDAZOLAM HCL 5 MG/5ML IJ SOLN
INTRAMUSCULAR | Status: DC | PRN
  Administered 2024-04-26: 1 mg via INTRAVENOUS
  Administered 2024-04-26: 2 mg via INTRAVENOUS
  Administered 2024-04-26 (×5): 1 mg via INTRAVENOUS

## 2024-04-26 MED ORDER — POVIDONE-IODINE 10 % EX SWAB
2.0000 | Freq: Once | CUTANEOUS | Status: AC
Start: 2024-04-26 — End: 2024-04-26
  Administered 2024-04-26: 2 via TOPICAL

## 2024-04-26 SURGICAL SUPPLY — 7 items
CABLE SURGICAL S-101-97-12 (CABLE) ×1 IMPLANT
DEVICE DISSECT PLASMABLAD 3.0S (MISCELLANEOUS) IMPLANT
ICD COBALT XT CRT DTPA2D1 (ICD Generator) IMPLANT
PAD DEFIB RADIO PHYSIO CONN (PAD) ×1 IMPLANT
POUCH AIGIS-R ANTIBACT ICD (Mesh General) ×1 IMPLANT
POUCH AIGIS-R ANTIBACT ICD LRG (Mesh General) IMPLANT
TRAY PACEMAKER INSERTION (PACKS) ×1 IMPLANT

## 2024-04-26 NOTE — Plan of Care (Signed)

## 2024-04-26 NOTE — H&P (Signed)
 HPI [name] returns today for followup. She is a pleasant 73 yo woman with CAD, s/p MI, with an ICM, s/p Biv ICD insertion. She has had trouble with weight gain and then was placed on Ozempic. She has lost another 40 lbs. She feels well. She appears much younger than her stated age. She also notes stage 4 renal failure.  She developed worsening SVT and underwent catheter ablation of AVNRT.  Allergies       Allergies  Allergen Reactions   Calcium  Channel Blockers Other (See Comments)      Cannot take non-DHP CCBs (diltiazem, verapamil) due to CHF requiring ICD   Codeine Anaphylaxis and Other (See Comments)   Lyrica  [Pregabalin ]        Nightmares, and swelling   Other Anaphylaxis and Other (See Comments)      Walnuts, pecans, and ANY melons PATIENT IS A VEGETARIAN     Ramipril  Anaphylaxis, Swelling and Other (See Comments)   Ticlid [Ticlopidine Hcl] Other (See Comments)      Unprovoked bleeding while on Ticlid (doesn't think she was on ASA at the time) Takes Plavix  without problems   Morphine And Codeine Other (See Comments)      Severe AMS, confusion, weakness Tolerates Fentanyl , Tramadol    Tape Dermatitis and Rash      Tolerates paper tape   Cucumber Extract Other (See Comments)   Digoxin  Other (See Comments)   Digoxin  And Related Nausea Only      Unknown   Diltiazem Hcl Other (See Comments)   Diltiazem Hcl Er Beads Other (See Comments)   Metformin Hcl Other (See Comments)   Metolazone Other (See Comments)      Cannot remember reaction   Morphine Sulfate Other (See Comments)   Myrbetriq [Mirabegron] Other (See Comments)      Aggravates migraines   Nsaids Other (See Comments)   Onglyza [Saxagliptin] Other (See Comments)      Exacerbates migraines   Penicillin V Other (See Comments)   Penicillins Hives      Has patient had a PCN reaction causing immediate rash, facial/tongue/throat swelling, SOB or lightheadedness with hypotension: Yes Has patient had a  PCN reaction causing severe rash involving mucus membranes or skin necrosis: Unknown Has patient had a PCN reaction that required hospitalization: Unknown Has patient had a PCN reaction occurring within the last 10 years: No If all of the above answers are NO, then may proceed with Cephalosporin use.     Spironolactone Other (See Comments)      Cannot remember reaction   Sulfa Antibiotics Hives and Other (See Comments)   Tetracycline Hcl Other (See Comments)   Tetracyclines & Related Other (See Comments)      Cannot remember reaction Tolerates macrolides (azithromycin)   Ticlopidine Other (See Comments)   Verapamil Other (See Comments)      Pacemaker/CHF   Vicodin [Hydrocodone-Acetaminophen ] Other (See Comments)      EXTREME LETHARGY   Latex Itching, Rash and Other (See Comments)                Current Outpatient Medications  Medication Sig Dispense Refill   albuterol  (PROVENTIL  HFA;VENTOLIN  HFA) 108 (90 BASE) MCG/ACT inhaler Inhale 2 puffs into the lungs every 4 (four) hours as needed for wheezing or shortness of breath.       Alcohol  Swabs (B-D SINGLE USE SWABS REGULAR) PADS         ALPRAZolam  (XANAX ) 1 MG tablet Take  1 mg by mouth 3 (three) times daily.    2   Artificial Tear GEL Place 2 drops into both eyes daily as needed (dry eyes).        aspirin  EC 81 MG tablet Take 4 tablets (325 mg total) by mouth daily. 120 tablet 11   BIOTIN PO Take 1 tablet by mouth daily.       cetirizine (ZYRTEC) 10 MG tablet Take 10 mg by mouth daily.       Cholecalciferol  (VITAMIN D -3) 1000 UNITS CAPS Take 1,000 Units by mouth daily.        clopidogrel  (PLAVIX ) 75 MG tablet TAKE 1 TABLET EVERY DAY 90 tablet 1   doxazosin  (CARDURA ) 4 MG tablet Take 4 mg by mouth at bedtime.        DROPLET PEN NEEDLES 32G X 4 MM MISC         EPINEPHrine  0.3 mg/0.3 mL IJ SOAJ injection Inject 0.3 mg into the muscle as needed for anaphylaxis.    3   ezetimibe  (ZETIA ) 10 MG tablet TAKE 1 TABLET EVERY DAY 90 tablet 1    Flaxseed, Linseed, (FLAXSEED OIL) 1200 MG CAPS Take 1,200 mg by mouth daily.       fluticasone  (FLONASE ) 50 MCG/ACT nasal spray Place 2 sprays into both nostrils daily as needed for allergies or rhinitis.       furosemide  (LASIX ) 80 MG tablet TAKE 1 TABLET TWICE A DAY. MAY TAKE AN ADDITIONAL 80MG  (1 TABLET) AS NEEDED FOR EDEMA (Patient taking differently: Take 160 mg by mouth daily. TAKE 1 TABLET TWICE A DAY. MAY TAKE AN ADDITIONAL 80MG  (1 TABLET) AS NEEDED FOR EDEMA) 270 tablet 1   hydrALAZINE  (APRESOLINE ) 25 MG tablet TAKE 1 TABLET TWICE DAILY 180 tablet 1   isosorbide  mononitrate (IMDUR ) 60 MG 24 hr tablet TAKE 1 TABLET EVERY DAY 90 tablet 1   Ketotifen Fumarate (ALAWAY OP) Place 1 drop into both eyes daily as needed (allergies).        [Paused] LANTUS  SOLOSTAR 100 UNIT/ML Solostar Pen Inject 6 Units into the skin at bedtime.       metoprolol  succinate (TOPROL -XL) 100 MG 24 hr tablet TAKE 2 TABLETS EVERY DAY WITH OR IMMEDIATELY FOLLOWING A MEAL 180 tablet 1   metoprolol  tartrate (LOPRESSOR ) 25 MG tablet Take 1 tablet (25 mg total) by mouth every 6 (six) hours as needed (palpitations). 30 tablet 3   Multiple Vitamin (MULTIVITAMIN WITH MINERALS) TABS Take 1 tablet by mouth daily.       nitroGLYCERIN  (NITROSTAT ) 0.4 MG SL tablet Place 1 tablet under the tongue every 5 minutes as needed for chest pain, max 3 doses, go to er if no relief 75 tablet 2   olmesartan  (BENICAR ) 40 MG tablet Take 1 tablet (40 mg total) by mouth daily. 90 tablet 2   OZEMPIC, 0.25 OR 0.5 MG/DOSE, 2 MG/1.5ML SOPN Inject 0.25 mg into the skin once a week. Sunday       pantoprazole  (PROTONIX ) 40 MG tablet TAKE 1 TABLET BY MOUTH  DAILY 90 tablet 3   potassium chloride  (KLOR-CON  M) 10 MEQ tablet TAKE 2 TABLETS TWICE A DAY (KEEP MD APPOINTMENT FOR REFILLS) (Patient taking differently: Take 20 mEq by mouth daily. TAKE 2 TABLETS TWICE A DAY (KEEP MD APPOINTMENT FOR REFILLS)) 360 tablet 3   simvastatin  (ZOCOR ) 40 MG tablet Take 1  tablet (40 mg total) by mouth at bedtime. (Patient taking differently: Take 40 mg by mouth daily.) 90 tablet 2  sodium chloride  (MURO 128) 5 % ophthalmic ointment Place 1 drop into both eyes at bedtime as needed for eye irritation. For dry eyes       traMADol  (ULTRAM ) 50 MG tablet Take 100 mg by mouth every 6 (six) hours as needed (MIGRAINES).       TRUE METRIX BLOOD GLUCOSE TEST test strip         TRUEplus Lancets 33G MISC         venlafaxine  (EFFEXOR ) 100 MG tablet Take 100-200 mg by mouth See admin instructions. TAKE 200 mg by mouth in the morning and take 100 mg at noon       vitamin A 7500 UNIT capsule Take 2,400 Units by mouth daily.          No current facility-administered medications for this visit.              Past Medical History:  Diagnosis Date   AICD (automatic cardioverter/defibrillator) present      [employer]- Dr. Alesia Husky follows   Anginal pain (HCC)     Anxiety     Asthma     Back pain      pinched nerve-lower back - Dr. Rexanne Catalina follows.   Biventricular implantable cardioverter-defibrillator in situ 06/18/2011   Cerebral infarction (HCC) 11/11/2012   Cervical dysplasia     Chronic diastolic heart failure (HCC) 03/22/2016   Chronic kidney disease      Dr. Lydia Sams follows.   Chronic renal insufficiency, stage III (moderate) (HCC) 11/11/2012   Complication of anesthesia      I wake up during surgeries (02/14/2013)   Coronary artery disease involving coronary bypass graft of native heart with angina pectoris (HCC) 06/18/2011   Depression     DM (diabetes mellitus) (HCC) 11/11/2012   Fibroid     Function kidney decreased     GERD (gastroesophageal reflux disease)     Hemiplegia, unspecified, affecting nondominant side 11/11/2012   Hepatitis      Hepatitis A -college yrswater source exposure   Hiatal hernia     History of shingles      2-3 yrs ago last out break around waist   History of stomach ulcers     Hyperlipidemia 07/30/2016   Hypertension     ICD  (implantable cardiac defibrillator) in place     Iron  deficiency anemia     Ischemic cardiomyopathy      status post biventricular ICD placed by DR Edumunds who used to see Dr Ebbie Goldmann here to establish  cardiovascular care.   Migraines     MVP (mitral valve prolapse)      Antibiotics not required for procedures   Myocardial infarction (HCC)      I've had 2; the others they were able to catch before completing (02/14/2013)   Pacemaker     Paroxysmal SVT (supraventricular tachycardia) (HCC) 06/18/2011   Pneumonia 1950's & 1985   Shortness of breath      lying down flat; at times w/exertion (02/14/2013). 10-06-15 exertion only..   Stroke (HCC)      2 confirmed; 9 TIA's; results in dragging LLE; numbness in tip of tongue (02/14/2013),10-06-15 right hand tends to be weaker when tired.   TIA (transient ischemic attack) 11/11/2012   Type II diabetes mellitus (HCC)            ROS:    All systems reviewed and negative except as noted in the HPI.          Past Surgical History:  Procedure Laterality Date   ABDOMINAL HYSTERECTOMY   1985    TAH    BIV ICD GENERTAOR CHANGE OUT   06/2007; 04/2008    2 lead initial placement, at [facility]; done at [facility], after developing CHF (02/14/2013)   BIV PACEMAKER GENERATOR CHANGE OUT N/A 01/29/2015    Procedure: BIV PACEMAKER GENERATOR CHANGE OUT;  Surgeon: Tammie Fall, MD;  Location: [facility] CATH LAB;  Service: Cardiovascular;  Laterality: N/A;   BREAST EXCISIONAL BIOPSY Left 01/2007; 06/2007; 03/2008    benign (02/14/2013)   BREAST SURGERY       CARDIAC CATHETERIZATION        probably in the teens (02/14/2013)   CARDIAC DEFIBRILLATOR PLACEMENT       COLONOSCOPY   ~ 2002   COLONOSCOPY WITH PROPOFOL  N/A 10/07/2015    Procedure: COLONOSCOPY WITH PROPOFOL ;  Surgeon: Tami Falcon, MD;  Location: [facility] ENDOSCOPY;  Service: Endoscopy;  Laterality: N/A;   CORONARY ANGIOPLASTY WITH STENT PLACEMENT        started out w/5; bypass corrected some; 1 stent since the bypass  (02/14/2013)   CORONARY ARTERY BYPASS GRAFT   ` 1998    LIMA-LAD, SVG-D1, SVG-PDA   DILATION AND CURETTAGE OF UTERUS   1975 X 2; 1976; 1977   INSERT / REPLACE / REMOVE PACEMAKER        biventricular defibrillator--06/10/ 2009   LEFT HEART CATH AND CORS/GRAFTS ANGIOGRAPHY N/A 08/04/2018    Procedure: LEFT HEART CATH AND CORS/GRAFTS ANGIOGRAPHY;  Surgeon: Odie Benne, MD;  Location: [facility] INVASIVE CV LAB;  Service: Cardiovascular;  Laterality: N/A;   LEFT HEART CATHETERIZATION WITH CORONARY/GRAFT ANGIOGRAM N/A 01/03/2015    Procedure: LEFT HEART CATHETERIZATION WITH Estella Helling;  Surgeon: Mickiel Albany, MD;  Location: [facility] CATH LAB;  Service: Cardiovascular;  Laterality: N/A;   SUPRAVENTRICULAR TACHYCARDIA ABLATION   06/2007   SVT ABLATION N/A 11/04/2023    Procedure: SVT ABLATION;  Surgeon: Tammie Fall, MD;  Location: [facility] INVASIVE CV LAB;  Service: Cardiovascular;  Laterality: N/A;   TEE WITHOUT CARDIOVERSION   11/14/2012    Procedure: TRANSESOPHAGEAL ECHOCARDIOGRAM (TEE);  Surgeon: Dorothye Gathers, MD;  Location: [facility] ENDOSCOPY;  Service: Cardiovascular;  Laterality: N/A;                 Family History  Problem Relation Age of Onset   Heart disease Mother     Hypertension Mother     Heart attack Mother     Stroke Mother     Heart disease Father     Hypertension Father     Diabetes Father     Kidney failure Father     Heart attack Father     Stroke Father     Heart disease Brother     Diabetes Brother     Kidney failure Brother     Heart attack Brother     Diabetes Paternal Grandmother              Social History         Socioeconomic History   Marital status: Married      Spouse name: [name]   Number of children: 1   Years of education: Not on file   Highest education level: Master's degree (e.g., MA, MS, MEng, MEd, MSW, MBA)  Occupational History      Employer: UNEMPLOYED      Comment: Disablity  Tobacco Use   Smoking status: Never   Smokeless  tobacco: Never  Vaping Use  Vaping status: Never Used  Substance and Sexual Activity   Alcohol  use: No      Alcohol /week: 0.0 standard drinks of alcohol    Drug use: No   Sexual activity: Yes      Birth control/protection: Surgical  Other Topics Concern   Not on file  Social History Narrative    Caffeine Use: none    Social Drivers of Acupuncturist Strain: Not on file  Food Insecurity: No Food Insecurity (11/04/2023)    Hunger Vital Sign     Worried About Running Out of Food in the Last Year: Never true     Ran Out of Food in the Last Year: Never true  Transportation Needs: No Transportation Needs (11/04/2023)    PRAPARE - Therapist, art (Medical): No     Lack of Transportation (Non-Medical): No  Physical Activity: Not on file  Stress: Not on file  Social Connections: Not on file  Intimate Partner Violence: Not At Risk (11/04/2023)    Humiliation, Afraid, Rape, and Kick questionnaire     Fear of Current or Ex-Partner: No     Emotionally Abused: No     Physically Abused: No     Sexually Abused: No        BP 104/66   Pulse 73   Ht 5' 3.5 (1.613 m)   Wt 111 lb (50.3 kg)   SpO2 97%   BMI 19.35 kg/m    Physical Exam:   Well appearing 73 yo man, NAD HEENT: Unremarkable Neck:  No JVD, no thyromegally Lymphatics:  No adenopathy Back:  No CVA tenderness Lungs:  Clear with no wheezes HEART:  Regular rate rhythm, no murmurs, no rubs, no clicks Abd:  soft, positive bowel sounds, no organomegally, no rebound, no guarding Ext:  2 plus pulses, no edema, no cyanosis, no clubbing Skin:  No rashes no nodules Neuro:  CN II through XII intact, motor grossly intact   EKG - nsr with biv pacing   DEVICE  Normal device function.  See PaceArt for details. 1 month to ERI.   Assess/Plan: SVT - she is s/p catheter ablation and is doing well. No additional SVT on her device interrogation. Chronic systolic heart failure - her  symptoms are class 2. She will continue her current meds. ICD - her biv device is working normally. She is about a month to [place]. Will plan gen change out in about 6 weeks. I encouraged her to gain weight. Anemia - she is getting iron  infusions.    Donnald Fuss  EP Attending  The patient has reached ERI and will undergo ICD gen change out. As she has lost much weight, We will move her pocket to a subpectoral location.  Pete Brand Lancelot Alyea,MD

## 2024-04-26 NOTE — Plan of Care (Signed)
  Problem: Education: Goal: Knowledge of cardiac device and self-care will improve Outcome: Progressing Goal: Ability to safely manage health related needs after discharge will improve Outcome: Progressing Goal: Individualized Educational Video(s) Outcome: Progressing   Problem: Cardiac: Goal: Ability to achieve and maintain adequate cardiopulmonary perfusion will improve Outcome: Progressing   Problem: Education: Goal: Knowledge of General Education information will improve Description: Including pain rating scale, medication(s)/side effects and non-pharmacologic comfort measures Outcome: Progressing

## 2024-04-27 ENCOUNTER — Encounter (HOSPITAL_COMMUNITY): Payer: Self-pay | Admitting: Internal Medicine

## 2024-04-27 DIAGNOSIS — I2510 Atherosclerotic heart disease of native coronary artery without angina pectoris: Secondary | ICD-10-CM | POA: Diagnosis not present

## 2024-04-27 DIAGNOSIS — N189 Chronic kidney disease, unspecified: Secondary | ICD-10-CM | POA: Diagnosis not present

## 2024-04-27 DIAGNOSIS — J45909 Unspecified asthma, uncomplicated: Secondary | ICD-10-CM | POA: Diagnosis not present

## 2024-04-27 DIAGNOSIS — E1122 Type 2 diabetes mellitus with diabetic chronic kidney disease: Secondary | ICD-10-CM | POA: Diagnosis not present

## 2024-04-27 DIAGNOSIS — Z45010 Encounter for checking and testing of cardiac pacemaker pulse generator [battery]: Secondary | ICD-10-CM

## 2024-04-27 DIAGNOSIS — I130 Hypertensive heart and chronic kidney disease with heart failure and stage 1 through stage 4 chronic kidney disease, or unspecified chronic kidney disease: Secondary | ICD-10-CM | POA: Diagnosis not present

## 2024-04-27 DIAGNOSIS — I5032 Chronic diastolic (congestive) heart failure: Secondary | ICD-10-CM | POA: Diagnosis not present

## 2024-04-27 DIAGNOSIS — Z91040 Latex allergy status: Secondary | ICD-10-CM | POA: Diagnosis not present

## 2024-04-27 DIAGNOSIS — I447 Left bundle-branch block, unspecified: Secondary | ICD-10-CM | POA: Diagnosis not present

## 2024-04-27 DIAGNOSIS — Z4502 Encounter for adjustment and management of automatic implantable cardiac defibrillator: Secondary | ICD-10-CM | POA: Diagnosis not present

## 2024-04-27 MED ORDER — LIDOCAINE 5 % EX PTCH
1.0000 | MEDICATED_PATCH | CUTANEOUS | Status: DC
  Administered 2024-04-27 – 2024-04-28 (×2): 1 via TRANSDERMAL
  Filled 2024-04-27 (×2): qty 1

## 2024-04-27 MED ORDER — ACETAMINOPHEN 500 MG PO TABS
500.0000 mg | ORAL_TABLET | Freq: Four times a day (QID) | ORAL | Status: DC
  Administered 2024-04-27 – 2024-04-28 (×5): 500 mg via ORAL
  Filled 2024-04-27 (×5): qty 1

## 2024-04-27 MED ORDER — FENTANYL CITRATE PF 50 MCG/ML IJ SOSY
12.5000 ug | PREFILLED_SYRINGE | Freq: Once | INTRAMUSCULAR | Status: AC
  Administered 2024-04-27: 12.5 ug via INTRAVENOUS
  Filled 2024-04-27: qty 1

## 2024-04-27 MED ORDER — FENTANYL CITRATE PF 50 MCG/ML IJ SOSY: 12.5000 ug | PREFILLED_SYRINGE | Freq: Three times a day (TID) | INTRAMUSCULAR | Status: DC | PRN

## 2024-04-27 MED FILL — Cefazolin Sodium-Dextrose IV Solution 2 GM/100ML-4%: INTRAVENOUS | Qty: 100 | Status: CN

## 2024-04-27 MED FILL — Midazolam HCl Inj 2 MG/2ML (Base Equivalent): INTRAMUSCULAR | Qty: 8 | Status: CN

## 2024-04-27 NOTE — Progress Notes (Cosign Needed)
  Patient Name: [name] Date of Encounter: 04/27/2024  Primary Cardiologist: Dorn Lesches, MD Electrophysiologist: None  Interval Summary   Pt having significant pain this am, with 8/10 - 10/10 pain. Did not sleep last night. Fentanyl  helped get pain down to 3/4 but has rapidly trended back up.   Vital Signs    Vitals:   04/27/24 0451 04/27/24 0729 04/27/24 0941 04/27/24 1135  BP: (!) 143/82 (!) 152/94  (!) 152/95  Pulse: 74 80 75 76  Resp: 18 17  20   Temp: 98.1 F (36.7 C) 98.2 F (36.8 C) 98 F (36.7 C) 98.6 F (37 C)  TempSrc: Oral Oral Oral Oral  SpO2: 96% 96%  98%  Weight: 51.1 kg     Height:        Intake/Output Summary (Last 24 hours) at 04/27/2024 1515 Last data filed at 04/27/2024 0824 Gross per 24 hour  Intake 60 ml  Output --  Net 60 ml   Filed Weights   04/26/24 0750 04/26/24 1414 04/27/24 0451  Weight: 49.4 kg 50.8 kg 51.1 kg    Physical Exam    GEN- The patient is well appearing, alert and oriented x 3 today.   Lungs- Clear to ausculation bilaterally, normal work of breathing Cardiac- Regular rate and rhythm, no murmurs, rubs or gallops GI- soft, NT, ND, + BS Extremities- no clubbing or cyanosis. No edema  Telemetry    NSR 70-80s (personally reviewed)  Hospital Course    [name] is a 73 y.o. female admitted for sub pectoral gen change given lack of body fat. Post operative course complicated by pain.  Assessment & Plan    Chronic systolic CHF BIV ICD at ERI S/p Gen change with sub-pectoral placement of generator.  Leave pressure dressing in place until Monday Pain significant this am. Will try and get under control but may need to observe overnight until can manage without IV for breakthrough.  Schedule 500 mg tylenol .  Scheduled tramadol  100 mg q 6 hrs.  IV fentanyl  for breakthrough pain only.   H/o TIA Hold plavix  until Friday 6/27.  ADDENDUM 1300 Pt has continued to require IV medication for breakthrough.  Discussed with pharmacy and Dr. Waddell and will apple Lidoderm  patch adjacent to her surgical site.   ADDENDUM 1515 Pt is tolerating lidocaine  patch well. Pain is 4/10 but climbing back up as she is due for tramadol .  She has not slept. She does have someone to give her a ride, but isn't sure how late they can come.  Will plan on continued observation overnight, with intentional scheduling of pain medications to try and eliminate need for IV.   Plan for home tomorrow; Will need meds from [facility] clinic.    For questions or updates, please contact [facility] Please consult [email or web address] for contact info under     Signed, Ozell Prentice Passey, PA-C  04/27/2024, 3:15 PM    pr

## 2024-04-27 NOTE — Care Management Obs Status (Signed)
 MEDICARE OBSERVATION STATUS NOTIFICATION   Patient Details  Name: [name] MRN: [id number] Date of Birth: 1951-01-31   Medicare Observation Status Notification Given:  Yes    Jennett Model, RN 04/27/2024, 4:24 PM

## 2024-04-27 NOTE — Progress Notes (Signed)
 PT. C/o pain at surgical site. On call for Cardiology paged to make aware.

## 2024-04-27 NOTE — TOC CM/SW Note (Signed)
 Transition of Care [facility]) - Inpatient Brief Assessment   Patient Details  Name: [name] MRN: [id number] Date of Birth: 01-10-1951  [facility] CM/SW Contact:    Jennett Model, RN Phone Number: 04/27/2024, 8:35 AM   Clinical Narrative: From home alone, has PCP and insurance on file, states has no HH services in place at this time or DME at home.  States SIL will transport them home at [employer] and family of friends is support system, states gets medications from [place] on [address] in [place] for short term, and long term she uses [facility].  Pta self ambulatory.     Transition of Care Asessment: Insurance and Status: Insurance coverage has been reviewed Patient has primary care physician: Yes Home environment has been reviewed: home alone Prior level of function:: indep Prior/Current Home Services: No current home services Social Drivers of Health Review: SDOH reviewed no interventions necessary Readmission risk has been reviewed: Yes Transition of care needs: no transition of care needs at this time

## 2024-04-27 NOTE — Progress Notes (Signed)
  Pt with significant pain this am. Will discuss regimen with Pharm-D due to allergies.   Currently uncomfortable in bed and rates pain 10/10.  Will re-do fentanyl  to get pain under control, and schedule tylenol  + tramadol  in a staggered schedule.   Will check back in this afternoon for disposition.   Merla Starch, PA-C  04/27/2024

## 2024-04-27 NOTE — TOC Transition Note (Signed)
 Transition of Care [facility]) - Discharge Note   Patient Details  Name: [name] MRN: [id number] Date of Birth: 11/27/1950  [facility]) CM/SW Contact:  Jennett Model, RN Phone Number: 04/27/2024, 8:37 AM   Clinical Narrative:    For dc today, she has no needs.         Patient Goals and CMS Choice            Discharge Placement                       Discharge Plan and Services Additional resources added to the After Visit Summary for                                       Social Drivers of Health (SDOH) Interventions SDOH Screenings   Food Insecurity: No Food Insecurity (11/04/2023)  Housing: Unknown (04/14/2024)   Received from [facility]  Transportation Needs: No Transportation Needs (11/04/2023)  Utilities: Not At Risk (11/04/2023)  Depression (PHQ2-9): High Risk (09/15/2023)  Tobacco Use: Low Risk  (04/17/2024)   Received from [facility]     Readmission Risk Interventions     No data to display

## 2024-04-27 NOTE — Plan of Care (Signed)
  Problem: Education: Goal: Knowledge of cardiac device and self-care will improve Outcome: Progressing   Problem: Pain Managment: Goal: General experience of comfort will improve and/or be controlled Outcome: Progressing

## 2024-04-28 ENCOUNTER — Encounter (HOSPITAL_COMMUNITY): Payer: Self-pay

## 2024-04-28 ENCOUNTER — Other Ambulatory Visit (HOSPITAL_COMMUNITY): Payer: Self-pay

## 2024-04-28 DIAGNOSIS — I5032 Chronic diastolic (congestive) heart failure: Secondary | ICD-10-CM | POA: Diagnosis not present

## 2024-04-28 DIAGNOSIS — J45909 Unspecified asthma, uncomplicated: Secondary | ICD-10-CM | POA: Diagnosis not present

## 2024-04-28 DIAGNOSIS — Z91040 Latex allergy status: Secondary | ICD-10-CM | POA: Diagnosis not present

## 2024-04-28 DIAGNOSIS — N189 Chronic kidney disease, unspecified: Secondary | ICD-10-CM | POA: Diagnosis not present

## 2024-04-28 DIAGNOSIS — I130 Hypertensive heart and chronic kidney disease with heart failure and stage 1 through stage 4 chronic kidney disease, or unspecified chronic kidney disease: Secondary | ICD-10-CM | POA: Diagnosis not present

## 2024-04-28 DIAGNOSIS — Z4502 Encounter for adjustment and management of automatic implantable cardiac defibrillator: Secondary | ICD-10-CM | POA: Diagnosis not present

## 2024-04-28 DIAGNOSIS — I2510 Atherosclerotic heart disease of native coronary artery without angina pectoris: Secondary | ICD-10-CM | POA: Diagnosis not present

## 2024-04-28 DIAGNOSIS — E1122 Type 2 diabetes mellitus with diabetic chronic kidney disease: Secondary | ICD-10-CM | POA: Diagnosis not present

## 2024-04-28 DIAGNOSIS — I447 Left bundle-branch block, unspecified: Secondary | ICD-10-CM | POA: Diagnosis not present

## 2024-04-28 MED ORDER — TRAMADOL HCL 50 MG PO TABS
100.0000 mg | ORAL_TABLET | Freq: Four times a day (QID) | ORAL | 0 refills | Status: AC | PRN
  Filled 2024-04-28: qty 40, 5d supply, fill #0

## 2024-04-28 MED ORDER — ACETAMINOPHEN 500 MG PO TABS: 500.0000 mg | ORAL_TABLET | Freq: Four times a day (QID) | ORAL | Status: AC | PRN

## 2024-04-28 NOTE — Discharge Instructions (Addendum)
 Medication Changes: - Please hold Plavix  and Aspirin  until 05/04/2024. - You can remove the Lidocaine  patch tomorrow afternoon. - Will provide refill of Tramadol  to help with post-procedural pain to help get you through the next few days.

## 2024-04-28 NOTE — Plan of Care (Signed)
  Problem: Education: Goal: Knowledge of cardiac device and self-care will improve Outcome: Progressing   Problem: Education: Goal: Knowledge of General Education information will improve Description: Including pain rating scale, medication(s)/side effects and non-pharmacologic comfort measures Outcome: Progressing   Problem: Clinical Measurements: Goal: Will remain free from infection Outcome: Progressing   Problem: Clinical Measurements: Goal: Diagnostic test results will improve Outcome: Progressing

## 2024-04-28 NOTE — Progress Notes (Signed)
 Discharged to home, d/c instructions and ff up appts  done. PIV removed no s/sx of infection noted.

## 2024-04-28 NOTE — Progress Notes (Signed)
   Progress Note  Patient Name: ALYSSHA HOUSH Date of Encounter: 04/28/2024  Primary Cardiologist: Dorn Lesches, MD   Subjective   Pain much better.  I slept well last night.  Inpatient Medications    Scheduled Meds:  acetaminophen   500 mg Oral Q6H   lidocaine   1 patch Transdermal Q24H   Continuous Infusions:  PRN Meds: ALPRAZolam , fentaNYL  (SUBLIMAZE ) injection, ondansetron  (ZOFRAN ) IV, traMADol    Vital Signs    Vitals:   04/27/24 2011 04/27/24 2327 04/28/24 0326 04/28/24 0729  BP: (!) 148/94 (!) 156/93 (!) 144/99 (!) 144/91  Pulse: 85 86 91 89  Resp: 20 14 13 15   Temp: 98.8 F (37.1 C) 98.3 F (36.8 C) 98.3 F (36.8 C) 98.1 F (36.7 C)  TempSrc: Oral Oral Oral Oral  SpO2: 97% 99% 98% 97%  Weight:   51 kg   Height:        Intake/Output Summary (Last 24 hours) at 04/28/2024 1035 Last data filed at 04/27/2024 2124 Gross per 24 hour  Intake 240 ml  Output --  Net 240 ml   Filed Weights   04/26/24 1414 04/27/24 0451 04/28/24 0326  Weight: 50.8 kg 51.1 kg 51 kg    Telemetry    NSR with biv pacing - Personally Reviewed  ECG    none - Personally Reviewed  Physical Exam   GEN: No acute distress.   Neck: No JVD Cardiac: RRR, no murmurs, rubs, or gallops.  Respiratory: Clear to auscultation bilaterally. Bandage is clean and dry. GI: Soft, nontender, non-distended  MS: No edema; No deformity. Neuro:  Nonfocal  Psych: Normal affect   Labs    ChemistryNo results for input(s): NA, K, CL, CO2, GLUCOSE, BUN, CREATININE, CALCIUM , PROT, ALBUMIN , AST, ALT, ALKPHOS, BILITOT, GFRNONAA, GFRAA, ANIONGAP in the last 168 hours.   HematologyNo results for input(s): WBC, RBC, HGB, HCT, MCV, MCH, MCHC, RDW, PLT in the last 168 hours.  Cardiac EnzymesNo results for input(s): TROPONINI in the last 168 hours. No results for input(s): TROPIPOC in the last 168 hours.   BNPNo results for input(s): BNP,  PROBNP in the last 168 hours.   DDimer No results for input(s): DDIMER in the last 168 hours.   Radiology    No results found.  Cardiac Studies   none  Patient Profile     73 y.o. female admitted for pain control after undergoing ICD gen change with pocket relocation. Patient unable to take oral narcotics due to multiple allergies.  Assessment & Plan    ICD gen change with pocket relocation - she is stable. No evidence of pocket hematoma.  Pain - her acute pain from the relocation to a subpectoral pocket is much improved. She will be discharged home and take tylenol  and tramadol . She has a lidocaine  patch. Chronic systolic heart failure - her symptoms are class 2. No change in GDMT. Coags - no plavix  until this coming Friday, 6 days from now.  For questions or updates, please contact [facility] Please consult [email or web address] for contact info under Cardiology/STEMI.      Signed, Danelle Birmingham, MD  04/28/2024, 10:35 AM

## 2024-04-28 NOTE — Discharge Summary (Cosign Needed Addendum)
 Discharge Summary   Patient ID: [name] MRN: [id number]; DOB: Feb 26, 1951  Admit date: 04/26/2024 Discharge date: 04/28/2024  PCP:  Claudene Pellet, MD    HeartCare Providers Cardiologist:  Dorn Lesches, MD   Click here to update MD or APP on Care Team, Refresh:1}    Discharge Diagnoses  Principal Problem:   ICD (implantable cardioverter-defibrillator) in place Active Problems:   Pacemaker at end of battery life   Diagnostic Studies/Procedures   BIV ICD Generator Changeout 04/26/2024: Conclusion: Successful removal of her previously implanted biventricular ICD which had reached elective replacement, and insertion of a new biventricular ICD in a patient with chronic systolic heart failure and left bundle branch block. Because the patient had lost 60 pounds and had no subcutaneous fat, and because of the concern for lead erosion through the skin, the new device and leads were placed in a subpectoral location.  _____________   History of Present Illness   [name] is a 73 y.o. female with a history of CAD s/p remote CABG x3 (LIMA-LAD, SVG-Diag, SVG-PDA) in 1998, ischemic cardiomyopathy/ chronic HFmrEF with EF of 45-50%, s/p Medtronic BIV ICD in 2016, hypertension, hyperlipidemia, type 2 diabetes mellitus, CKD stage III, TIA, GERD, and iron  deficiency anemia who is followed by Dr. Lesches and Dr. Waddell who was admitted on 04/26/2024 for planned ICD generator changeout.   Hospital Course   Patient underwent planned Medtronic BiV ICD generator changeout with Dr. Waddell on 04/26/2024. The new device and leads were placed in a subpectoral location given significant weight lost and no subcutaneous fat. Pressure dressing was placed. Post-op course was complicated by pain requiring IV Fentanyl  for breakthrough pain. Lidocaine  patch was also placed with improvement in pain. Felt to be stable for discharge today. There is no evidence of a pocket hematoma. Will place a  new Lidocaine  patch prior to discharge which she can remove on 04/29/2024. She takes Tramadol  100mg  every 6 hours as needed for migraines. OK to continue home dose of Tramadol  every 6 hours as needed. Also recommended over-the-counter Tylenol  as needed every 6 hours. She states she only has a couple tablets of Tramadol  left at home because she does not have to take this often. Discussed with Dr. Waddell - will provide a refill of this to get her through the next few days. Provided 40 of the 50mg  tablets (like she has at home) with 0 refills. Reviewed [place] [facility] website per STOP protocol. Patient is on Aspirin  325mg  daily and Plavix  75mg  daily at home for prior TIAs. Discussed with Dr. Waddell - will hold both until 05/04/2024. She expressed concerns about holding both Aspirin  and Plavix . Explained the reason for holding both of these. She agreed to hold Plavix  until 05/04/2024 but stated she was only going to hold Aspirin  until Monday 04/30/2024. Encouraged her to reach out to her PCP/ Neurologist on Monday since she is worried about holding this for longer. It looks like she already has a follow-up with her PCP scheduled for Monday. Patient will also come by our office on Monday 04/30/2024 to have pressure dressing removed. Will resume all other home medications including Lasix , GDMT, and lipid therapy.   Patient was seen and examined today by Dr. Waddell and determined to be stable for discharge. Routine outpatient follow-ups arranged with EP. Medications as below. _____________  Discharge Vitals Blood pressure (!) 144/91, pulse 89, temperature 98.1 F (36.7 C), temperature source Oral, resp. rate 15, height 5' 3 (1.6 m), weight 51  kg, SpO2 97%.  Filed Weights   04/26/24 1414 04/27/24 0451 04/28/24 0326  Weight: 50.8 kg 51.1 kg 51 kg    Labs & Radiologic Studies  CBC No results for input(s): WBC, NEUTROABS, HGB, HCT, MCV, PLT in the last 72 hours. Basic Metabolic Panel No results  for input(s): NA, K, CL, CO2, GLUCOSE, BUN, CREATININE, CALCIUM , MG, PHOS in the last 72 hours. Liver Function Tests No results for input(s): AST, ALT, ALKPHOS, BILITOT, PROT, ALBUMIN  in the last 72 hours. No results for input(s): LIPASE, AMYLASE in the last 72 hours. High Sensitivity Troponin:   No results for input(s): TROPONINIHS in the last 720 hours.  No results for input(s): TRNPT in the last 720 hours.  BNP Invalid input(s): POCBNP No results for input(s): PROBNP in the last 72 hours.  No results for input(s): BNP in the last 72 hours.  D-Dimer No results for input(s): DDIMER in the last 72 hours. Hemoglobin A1C No results for input(s): HGBA1C in the last 72 hours. Fasting Lipid Panel No results for input(s): CHOL, HDL, LDLCALC, TRIG, CHOLHDL, LDLDIRECT in the last 72 hours. No results found for: LIPOA  Thyroid  Function Tests No results for input(s): TSH, T4TOTAL, T3FREE, THYROIDAB in the last 72 hours.  Invalid input(s): FREET3 _____________  EP PPM/ICD IMPLANT Result Date: 04/26/2024 Conclusion: Successful removal of her previously implanted biventricular ICD which had reached elective replacement, and insertion of a new biventricular ICD in a patient with chronic systolic heart failure and left bundle branch block.  Because the patient had lost 60 pounds and had no subcutaneous fat, and because of the concern for lead erosion through the skin, the new device and leads were placed in a subpectoral location. Danelle Birmingham, MD   CUP PACEART REMOTE DEVICE CHECK Result Date: 04/03/2024 Monthly battery check.  RRT triggered 03/20/24; Gen change scheduled for 04/26/24 per Epic. Normal device function. No new alerts. Follow up as scheduled monthly. [facility], [facility]   Disposition Ptatient is being discharged home today in good condition.  Follow-up Plans & Appointments  Follow-up Information     Claudene Pellet, MD. Go  on 04/30/2024.   Specialty: Family Medicine Why: @11 :45am Contact information: [address] [place] [address] [phone]         Lesia Ozell Barter, PA-C Follow up.   Specialty: Cardiology Why: Visit on 04/30/2024 at 10am to remove pressure dressing. Contact information: [address] [place] [address] [phone]         [facility] Follow up.   Specialty: Cardiology Why: Wound check scheduled for 05/09/2024 at 12:00pm. Contact information: [address] [place]  [address] [phone]               Discharge Instructions     Diet - low sodium heart healthy   Complete by: As directed    Increase activity slowly   Complete by: As directed        Discharge Medications Allergies as of 04/28/2024       Reactions   Calcium  Channel Blockers Other (See Comments)   Cannot take non-DHP CCBs (diltiazem, verapamil) due to CHF requiring ICD   Codeine Anaphylaxis, Other (See Comments)   Lyrica  [pregabalin ]    Nightmares, and swelling   Other Anaphylaxis, Other (See Comments)   Walnuts, pecans, and ANY melons PATIENT IS A VEGETARIAN   Ramipril  Anaphylaxis, Swelling, Other (See Comments)   Ticlid [ticlopidine  Hcl] Other (See Comments)   Unprovoked bleeding while on Ticlid (doesn't think she was on ASA at the time) Takes Plavix  without problems   Morphine And Codeine Other (See Comments)   Severe AMS, confusion, weakness Tolerates Fentanyl , Tramadol    Tape Dermatitis, Rash   Tolerates paper tape   Cucumber Extract Other (See Comments)   Digoxin  Other (See Comments)   Digoxin  And Related Nausea Only   Unknown   Diltiazem Hcl Other (See Comments)   Metformin Hcl Other (See Comments)   Metolazone Other (See Comments)   Cannot remember reaction   Morphine Sulfate Other (See Comments)   Myrbetriq [mirabegron] Other (See Comments)   Aggravates migraines    Nsaids Other (See Comments)   Onglyza [saxagliptin] Other (See Comments)   Exacerbates migraines   Penicillins Hives   Has patient had a PCN reaction causing immediate rash, facial/tongue/throat swelling, SOB or lightheadedness with hypotension: Yes Has patient had a PCN reaction causing severe rash involving mucus membranes or skin necrosis: Unknown Has patient had a PCN reaction that required hospitalization: Unknown Has patient had a PCN reaction occurring within the last 10 years: No If all of the above answers are NO, then may proceed with Cephalosporin use.   Spironolactone Other (See Comments)   Cannot remember reaction   Sulfa Antibiotics Hives, Other (See Comments)   Tetracyclines & Related Other (See Comments)   Cannot remember reaction Tolerates macrolides (azithromycin)   Ticlopidine Other (See Comments)   Verapamil Other (See Comments)   Pacemaker/CHF   Vicodin [hydrocodone-acetaminophen ] Other (See Comments)   EXTREME LETHARGY   Latex Itching, Rash, Other (See Comments)        Medication List     PAUSE taking these medications    aspirin  EC 81 MG tablet Wait to take this until: May 04, 2024 Take 4 tablets (325 mg total) by mouth daily.   clopidogrel  75 MG tablet Wait to take this until: May 04, 2024 Commonly known as: PLAVIX  Take 1 tablet (75 mg total) by mouth daily.   Lantus  SoloStar 100 UNIT/ML Solostar Pen Wait to take this until your doctor or other care provider tells you to start again. Generic drug: insulin  glargine Inject 6 Units into the skin at bedtime.       TAKE these medications    acetaminophen  500 MG tablet Commonly known as: TYLENOL  Take 1 tablet (500 mg total) by mouth every 6 (six) hours as needed for mild pain (pain score 1-3) or moderate pain (pain score 4-6).   ALAWAY OP Place 1 drop into both eyes daily as needed (allergies).   albuterol  108 (90 Base) MCG/ACT inhaler Commonly known as: VENTOLIN  HFA Inhale 2 puffs into  the lungs every 4 (four) hours as needed for wheezing or shortness of breath.   ALPRAZolam  1 MG tablet Commonly known as: XANAX  Take 1 mg by mouth 3 (three) times daily.   Artificial Tear Gel Place 2 drops into both eyes daily as needed (dry eyes).   B-D SINGLE USE SWABS  REGULAR Pads   BIOTIN PO Take 1 tablet by mouth daily.   cetirizine 10 MG tablet Commonly known as: ZYRTEC Take 10 mg by mouth daily.   doxazosin  4 MG tablet Commonly known as: CARDURA  Take 4 mg by mouth at bedtime.   Droplet Pen Needles 32G X 4 MM Misc Generic drug: Insulin  Pen Needle   EPINEPHrine  0.3 mg/0.3 mL Soaj injection Commonly known as: EPI-PEN Inject 0.3 mg into the muscle as needed for anaphylaxis.  ezetimibe  10 MG tablet Commonly known as: ZETIA  Take 1 tablet (10 mg total) by mouth daily.   Flaxseed Oil 1200 MG Caps Take 1,200 mg by mouth daily.   fluticasone  50 MCG/ACT nasal spray Commonly known as: FLONASE  Place 2 sprays into both nostrils daily as needed for allergies or rhinitis.   furosemide  80 MG tablet Commonly known as: LASIX  TAKE 1 TABLET TWICE A DAY. MAY TAKE AN ADDITIONAL 80MG  (1 TABLET) AS NEEDED FOR EDEMA   hydrALAZINE  25 MG tablet Commonly known as: APRESOLINE  Take 1 tablet (25 mg total) by mouth 2 (two) times daily.   isosorbide  mononitrate 60 MG 24 hr tablet Commonly known as: IMDUR  Take 1 tablet (60 mg total) by mouth daily.   metoprolol  succinate 100 MG 24 hr tablet Commonly known as: TOPROL -XL Take 2 tablets (200 mg total) by mouth daily. Take with or immediately following a meal.   metoprolol  tartrate 25 MG tablet Commonly known as: LOPRESSOR  Take 1 tablet (25 mg total) by mouth every 6 (six) hours as needed (palpitations).   multivitamin with minerals Tabs tablet Take 1 tablet by mouth daily.   nitroGLYCERIN  0.4 MG SL tablet Commonly known as: Nitrostat  Place 1 tablet under the tongue every 5 minutes as needed for chest pain, max 3 doses, go to er if  no relief   olmesartan  40 MG tablet Commonly known as: BENICAR  Take 1 tablet (40 mg total) by mouth daily.   Ozempic (0.25 or 0.5 MG/DOSE) 2 MG/1.5ML Sopn Generic drug: Semaglutide(0.25 or 0.5MG /DOS) Inject 0.25 mg into the skin once a week. Sunday   pantoprazole  40 MG tablet Commonly known as: PROTONIX  TAKE 1 TABLET BY MOUTH  DAILY   potassium chloride  10 MEQ tablet Commonly known as: KLOR-CON  M TAKE 2 TABLETS TWICE A DAY (KEEP MD APPOINTMENT FOR REFILLS) What changed:  how much to take how to take this when to take this   Retacrit  10000 UNIT/ML injection Generic drug: epoetin  alfa-epbx 10,000 Units every 21 ( twenty-one) days.   simvastatin  40 MG tablet Commonly known as: ZOCOR  Take 1 tablet (40 mg total) by mouth at bedtime. What changed: when to take this   sodium chloride  5 % ophthalmic ointment Commonly known as: MURO 128 Place 1 drop into both eyes at bedtime as needed for eye irritation. For dry eyes   traMADol  50 MG tablet Commonly known as: ULTRAM  Take 2 tablets (100 mg total) by mouth every 6 (six) hours as needed (MIGRAINES).   True Metrix Blood Glucose Test test strip Generic drug: glucose blood   TRUEplus Lancets 33G Misc   venlafaxine  100 MG tablet Commonly known as: EFFEXOR  Take 100-200 mg by mouth See admin instructions. TAKE 200 mg by mouth in the morning and take 100 mg at noon   vitamin A 7500 UNIT capsule Take 2,400 Units by mouth daily.   Vitamin D -3 25 MCG (1000 UT) Caps Take 1,000 Units by mouth daily.         Outstanding Labs/Studies   Duration of Discharge Encounter: APP Time: 25 minutes   Signed, Callie E Goodrich, PA-C 04/28/2024, 12:26 PM  EP Attending  Patient seen and examined. See my rounding note as well. She is stable for DC. Followup as outlined above.   Danelle Niharika Savino,MD

## 2024-04-30 ENCOUNTER — Encounter: Payer: Self-pay | Admitting: Student

## 2024-04-30 ENCOUNTER — Telehealth: Payer: Self-pay | Admitting: Pharmacy Technician

## 2024-04-30 ENCOUNTER — Encounter (INDEPENDENT_AMBULATORY_CARE_PROVIDER_SITE_OTHER): Admitting: Student

## 2024-04-30 DIAGNOSIS — Z950 Presence of cardiac pacemaker: Secondary | ICD-10-CM | POA: Diagnosis not present

## 2024-04-30 MED ORDER — LIDOCAINE 5 % EX PTCH: 1.0000 | MEDICATED_PATCH | CUTANEOUS | 0 refills | Status: AC

## 2024-04-30 MED FILL — Midazolam HCl Inj 2 MG/2ML (Base Equivalent): INTRAMUSCULAR | Qty: 8 | Status: AC

## 2024-04-30 NOTE — Patient Instructions (Signed)
 Medication Instructions:  1.Continue to hold the plavix  until you see us  next week 2.We have sent in lidocaine  patches for you to use *If you need a refill on your cardiac medications before your next appointment, please call your pharmacy*  Lab Work: None ordered If you have labs (blood work) drawn today and your tests are completely normal, you will receive your results only by: MyChart Message (if you have MyChart) OR A paper copy in the mail If you have any lab test that is abnormal or we need to change your treatment, we will call you to review the results.  Follow-Up: At [facility], you and your health needs are our priority.  As part of our continuing mission to provide you with exceptional heart care, our providers are all part of one team.  This team includes your primary Cardiologist (physician) and Advanced Practice Providers or APPs (Physician Assistants and Nurse Practitioners) who all work together to provide you with the care you need, when you need it.  Your next appointment:   Keep appointments as previously scheduled

## 2024-04-30 NOTE — Progress Notes (Addendum)
  Wound check for pressure dressing removal.   Pain much better controlled.   Site is tense and swollen. No drainage or bleeding. Slightly tender along the borders. No signs of infection.   Dr. Nancey assessed as well.   Continue to hold plavix  until wound check next week.   Refilled lidoderm  patches which have significantly helped with her site/pocket pain s/p sub-pec gen change.  Reminded that patches should not go directly or nor directly adjacent to the wound site.   Ozell Jodie Passey, PA-C  04/30/2024 10:26 AM

## 2024-04-30 NOTE — Telephone Encounter (Signed)
 Pharmacy Patient Advocate Encounter   Received notification from CoverMyMeds that prior authorization for Lidocaine  patch is required/requested.   Insurance verification completed.   The patient is insured through [place] .   Per test claim: PA required; PA submitted to above mentioned insurance via CoverMyMeds Key/confirmation #/EOC [id number] Status is pending

## 2024-04-30 NOTE — Telephone Encounter (Signed)
 Pharmacy Patient Advocate Encounter  Received notification from [employer] that Prior Authorization for Lidocaine  patch has been DENIED.  See denial reason below. No denial letter attached in CMM. Will attach denial letter to Media tab once received. Will  possibly cover if used for chronic back pain, acute back pain, diabetic neuropathy, neuropathic cancer pain, pain associated with hip or knee osteoarthritis, post-herpetic neuralgia    PA #/Case ID/Reference #: [id number]

## 2024-05-01 ENCOUNTER — Telehealth: Payer: Self-pay | Admitting: Student in an Organized Health Care Education/Training Program

## 2024-05-01 NOTE — Telephone Encounter (Signed)
 Paged that patient had warmth and a small blister at recent device implant site. Patient had lost a fair amount of weight so the CRTD was moved from subQ to submuscular. Dressing removed yesterday (06/23). Ongoing warmth and swelling around implant site.   89F with HFmrEF/ICM (LVEF 40-45->45-50%), CAD s/p prior MI, AVNRT s/p slow pathway modification (11/04/23, Waddell) who had recent CRTD generator replacement (04/26/24).  CRTD: MDT DTPA2D1, SN [id number], DOI 04/26/24 RA: MDT 4923-54, SN [id number], DOI 06/28/07 RV: MDT 3052-41, SN [id number], DOI 06/28/07 LV: MDT 4194 Attain 88, SN [id number], DOI 04/17/08  I reviewed a picture she sent me and looks to be a more superficial blister at the lateral margin of the exit site of the stitch, may be small stitch abscess. She is arranging for family to bring her to the ED so I can evaluate.

## 2024-05-02 ENCOUNTER — Observation Stay (HOSPITAL_COMMUNITY)
Admission: EM | Admit: 2024-05-02 | Discharge: 2024-05-03 | Disposition: A | Discharge status: Home / Self Care | Attending: Internal Medicine | Admitting: Internal Medicine

## 2024-05-02 ENCOUNTER — Emergency Department (HOSPITAL_COMMUNITY)

## 2024-05-02 ENCOUNTER — Other Ambulatory Visit: Payer: Self-pay

## 2024-05-02 DIAGNOSIS — D500 Iron deficiency anemia secondary to blood loss (chronic): Secondary | ICD-10-CM | POA: Diagnosis not present

## 2024-05-02 DIAGNOSIS — T82837D Hemorrhage of cardiac prosthetic devices, implants and grafts, subsequent encounter: Secondary | ICD-10-CM

## 2024-05-02 DIAGNOSIS — J45909 Unspecified asthma, uncomplicated: Secondary | ICD-10-CM | POA: Diagnosis not present

## 2024-05-02 DIAGNOSIS — K219 Gastro-esophageal reflux disease without esophagitis: Secondary | ICD-10-CM | POA: Diagnosis not present

## 2024-05-02 DIAGNOSIS — T829XXA Unspecified complication of cardiac and vascular prosthetic device, implant and graft, initial encounter: Secondary | ICD-10-CM | POA: Diagnosis present

## 2024-05-02 DIAGNOSIS — I4719 Other supraventricular tachycardia: Secondary | ICD-10-CM | POA: Insufficient documentation

## 2024-05-02 DIAGNOSIS — Z4889 Encounter for other specified surgical aftercare: Principal | ICD-10-CM | POA: Insufficient documentation

## 2024-05-02 DIAGNOSIS — Z950 Presence of cardiac pacemaker: Secondary | ICD-10-CM | POA: Insufficient documentation

## 2024-05-02 DIAGNOSIS — Z4801 Encounter for change or removal of surgical wound dressing: Secondary | ICD-10-CM | POA: Diagnosis not present

## 2024-05-02 DIAGNOSIS — E1122 Type 2 diabetes mellitus with diabetic chronic kidney disease: Secondary | ICD-10-CM | POA: Insufficient documentation

## 2024-05-02 DIAGNOSIS — I2510 Atherosclerotic heart disease of native coronary artery without angina pectoris: Secondary | ICD-10-CM | POA: Insufficient documentation

## 2024-05-02 DIAGNOSIS — I10 Essential (primary) hypertension: Secondary | ICD-10-CM

## 2024-05-02 DIAGNOSIS — N184 Chronic kidney disease, stage 4 (severe): Secondary | ICD-10-CM | POA: Diagnosis not present

## 2024-05-02 DIAGNOSIS — Z7902 Long term (current) use of antithrombotics/antiplatelets: Secondary | ICD-10-CM | POA: Insufficient documentation

## 2024-05-02 DIAGNOSIS — Z91040 Latex allergy status: Secondary | ICD-10-CM | POA: Diagnosis not present

## 2024-05-02 DIAGNOSIS — D631 Anemia in chronic kidney disease: Secondary | ICD-10-CM | POA: Diagnosis not present

## 2024-05-02 DIAGNOSIS — I5032 Chronic diastolic (congestive) heart failure: Secondary | ICD-10-CM | POA: Insufficient documentation

## 2024-05-02 DIAGNOSIS — F419 Anxiety disorder, unspecified: Secondary | ICD-10-CM | POA: Insufficient documentation

## 2024-05-02 DIAGNOSIS — I130 Hypertensive heart and chronic kidney disease with heart failure and stage 1 through stage 4 chronic kidney disease, or unspecified chronic kidney disease: Secondary | ICD-10-CM | POA: Diagnosis not present

## 2024-05-02 DIAGNOSIS — Z8673 Personal history of transient ischemic attack (TIA), and cerebral infarction without residual deficits: Secondary | ICD-10-CM | POA: Insufficient documentation

## 2024-05-02 DIAGNOSIS — T82847A Pain from cardiac prosthetic devices, implants and grafts, initial encounter: Secondary | ICD-10-CM | POA: Diagnosis not present

## 2024-05-02 DIAGNOSIS — D62 Acute posthemorrhagic anemia: Secondary | ICD-10-CM | POA: Diagnosis not present

## 2024-05-02 DIAGNOSIS — M7989 Other specified soft tissue disorders: Secondary | ICD-10-CM | POA: Diagnosis not present

## 2024-05-02 DIAGNOSIS — F329 Major depressive disorder, single episode, unspecified: Secondary | ICD-10-CM | POA: Insufficient documentation

## 2024-05-02 DIAGNOSIS — I5020 Unspecified systolic (congestive) heart failure: Secondary | ICD-10-CM | POA: Diagnosis not present

## 2024-05-02 DIAGNOSIS — Z95810 Presence of automatic (implantable) cardiac defibrillator: Secondary | ICD-10-CM | POA: Diagnosis present

## 2024-05-02 DIAGNOSIS — Z7982 Long term (current) use of aspirin: Secondary | ICD-10-CM | POA: Diagnosis not present

## 2024-05-02 DIAGNOSIS — N179 Acute kidney failure, unspecified: Secondary | ICD-10-CM

## 2024-05-02 LAB — CBC WITH DIFFERENTIAL/PLATELET
Abs Immature Granulocytes: 0.04 10*3/uL (ref 0.00–0.07)
Basophils Absolute: 0.1 10*3/uL (ref 0.0–0.1)
Basophils Relative: 1 %
Eosinophils Absolute: 0.1 10*3/uL (ref 0.0–0.5)
Eosinophils Relative: 2 %
HCT: 28.9 % — ABNORMAL LOW (ref 36.0–46.0)
Hemoglobin: 9.9 g/dL — ABNORMAL LOW (ref 12.0–15.0)
Immature Granulocytes: 1 %
Lymphocytes Relative: 28 %
Lymphs Abs: 1.8 10*3/uL (ref 0.7–4.0)
MCH: 32 pg (ref 26.0–34.0)
MCHC: 34.3 g/dL (ref 30.0–36.0)
MCV: 93.5 fL (ref 80.0–100.0)
Monocytes Absolute: 0.7 10*3/uL (ref 0.1–1.0)
Monocytes Relative: 11 %
Neutro Abs: 3.8 10*3/uL (ref 1.7–7.7)
Neutrophils Relative %: 57 %
Platelets: 127 10*3/uL — ABNORMAL LOW (ref 150–400)
RBC: 3.09 MIL/uL — ABNORMAL LOW (ref 3.87–5.11)
RDW: 14.3 % (ref 11.5–15.5)
WBC: 6.6 10*3/uL (ref 4.0–10.5)
nRBC: 0 % (ref 0.0–0.2)

## 2024-05-02 LAB — BASIC METABOLIC PANEL WITH GFR
Anion gap: 11 (ref 5–15)
BUN: 61 mg/dL — ABNORMAL HIGH (ref 8–23)
CO2: 25 mmol/L (ref 22–32)
Calcium: 8.7 mg/dL — ABNORMAL LOW (ref 8.9–10.3)
Chloride: 99 mmol/L (ref 98–111)
Creatinine, Ser: 3.75 mg/dL — ABNORMAL HIGH (ref 0.44–1.00)
GFR, Estimated: 12 mL/min — ABNORMAL LOW (ref >60)
Glucose, Bld: 109 mg/dL — ABNORMAL HIGH (ref 70–99)
Potassium: 4.5 mmol/L (ref 3.5–5.1)
Sodium: 135 mmol/L (ref 135–145)

## 2024-05-02 LAB — C-REACTIVE PROTEIN: CRP: 0.8 mg/dL (ref <1.0)

## 2024-05-02 MED ORDER — DOXAZOSIN MESYLATE 4 MG PO TABS
4.0000 mg | ORAL_TABLET | Freq: Every day | ORAL | Status: DC
  Administered 2024-05-02: 4 mg via ORAL
  Filled 2024-05-02 (×2): qty 1

## 2024-05-02 MED ORDER — PANTOPRAZOLE SODIUM 40 MG PO TBEC
40.0000 mg | DELAYED_RELEASE_TABLET | Freq: Every day | ORAL | Status: DC
  Administered 2024-05-02 – 2024-05-03 (×2): 40 mg via ORAL
  Filled 2024-05-02 (×2): qty 1

## 2024-05-02 MED ORDER — ONDANSETRON HCL 4 MG/2ML IJ SOLN: 4.0000 mg | Freq: Four times a day (QID) | INTRAMUSCULAR | Status: DC | PRN

## 2024-05-02 MED ORDER — LIDOCAINE 5 % EX PTCH
1.0000 | MEDICATED_PATCH | CUTANEOUS | Status: DC
  Administered 2024-05-02 – 2024-05-03 (×2): 1 via TRANSDERMAL
  Filled 2024-05-02 (×2): qty 1

## 2024-05-02 MED ORDER — SIMVASTATIN 20 MG PO TABS
40.0000 mg | ORAL_TABLET | Freq: Every day | ORAL | Status: DC
  Administered 2024-05-02 – 2024-05-03 (×2): 40 mg via ORAL
  Filled 2024-05-02 (×2): qty 2

## 2024-05-02 MED ORDER — ACETAMINOPHEN 325 MG PO TABS: 650.0000 mg | ORAL_TABLET | ORAL | Status: DC | PRN

## 2024-05-02 MED ORDER — FUROSEMIDE 20 MG PO TABS
80.0000 mg | ORAL_TABLET | Freq: Two times a day (BID) | ORAL | Status: DC
  Administered 2024-05-02: 80 mg via ORAL
  Filled 2024-05-02: qty 4

## 2024-05-02 MED ORDER — METOPROLOL SUCCINATE ER 100 MG PO TB24
200.0000 mg | ORAL_TABLET | Freq: Every day | ORAL | Status: DC
  Administered 2024-05-02 – 2024-05-03 (×2): 200 mg via ORAL
  Filled 2024-05-02: qty 8
  Filled 2024-05-02: qty 2

## 2024-05-02 MED ORDER — VENLAFAXINE HCL 75 MG PO TABS
200.0000 mg | ORAL_TABLET | Freq: Every day | ORAL | Status: DC
  Administered 2024-05-02 – 2024-05-03 (×2): 200 mg via ORAL
  Filled 2024-05-02 (×3): qty 1

## 2024-05-02 MED ORDER — EZETIMIBE 10 MG PO TABS
10.0000 mg | ORAL_TABLET | Freq: Every day | ORAL | Status: DC
  Administered 2024-05-02 – 2024-05-03 (×2): 10 mg via ORAL
  Filled 2024-05-02 (×2): qty 1

## 2024-05-02 MED ORDER — HYDRALAZINE HCL 25 MG PO TABS
25.0000 mg | ORAL_TABLET | Freq: Two times a day (BID) | ORAL | Status: DC
  Administered 2024-05-02 – 2024-05-03 (×3): 25 mg via ORAL
  Filled 2024-05-02 (×3): qty 1

## 2024-05-02 MED ORDER — TRAMADOL HCL 50 MG PO TABS
100.0000 mg | ORAL_TABLET | Freq: Four times a day (QID) | ORAL | Status: DC | PRN
  Administered 2024-05-02 (×2): 100 mg via ORAL
  Filled 2024-05-02 (×2): qty 2

## 2024-05-02 MED ORDER — ALPRAZOLAM 0.5 MG PO TABS
1.0000 mg | ORAL_TABLET | Freq: Three times a day (TID) | ORAL | Status: DC
  Administered 2024-05-02 – 2024-05-03 (×5): 1 mg via ORAL
  Filled 2024-05-02: qty 4
  Filled 2024-05-02: qty 2
  Filled 2024-05-02: qty 4
  Filled 2024-05-02 (×2): qty 2

## 2024-05-02 MED ORDER — NITROGLYCERIN 0.4 MG SL SUBL: 0.4000 mg | SUBLINGUAL_TABLET | SUBLINGUAL | Status: DC | PRN

## 2024-05-02 MED ORDER — IRBESARTAN 300 MG PO TABS: 150.0000 mg | ORAL_TABLET | Freq: Every day | ORAL | Status: DC

## 2024-05-02 MED ORDER — VENLAFAXINE HCL 50 MG PO TABS
100.0000 mg | ORAL_TABLET | Freq: Every evening | ORAL | Status: DC
  Administered 2024-05-02: 100 mg via ORAL
  Filled 2024-05-02 (×2): qty 2

## 2024-05-02 MED ORDER — ISOSORBIDE MONONITRATE ER 60 MG PO TB24
60.0000 mg | ORAL_TABLET | Freq: Every day | ORAL | Status: DC
  Administered 2024-05-02 – 2024-05-03 (×2): 60 mg via ORAL
  Filled 2024-05-02: qty 2
  Filled 2024-05-02: qty 1

## 2024-05-02 NOTE — Discharge Summary (Incomplete)
 ELECTROPHYSIOLOGY DISCHARGE SUMMARY    Patient ID: [name],  MRN: [id number], DOB/AGE: [birth year] 71 y.o.  Admit date: 05/02/2024 Discharge date: 05/03/2024  Primary Care Physician: Claudene Pellet, MD  Primary Cardiologist: Dorn Lesches, MD  Electrophysiologist: Dr. Waddell   Primary Discharge Diagnosis:  CRT-D Generator Site Pain   Secondary Discharge Diagnosis:  HFmrEF CAD s/p MI  AVNRT s/p Slow Pathway Modification   Procedures This Admission:  N/A      Brief HPI: [name] is a 73 y.o. 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   Hospital Course:  The patient was presented to the ER on 05/02/24 with reports of device site swelling and discomfort. She was evaluated by EP and dressing was taken down. She has a small hematoma at site and an additional small blister at the lateral side of the incision (did not appear to be associated with the incision).  She had noted AKI and home ARB + diuretics were held. In addition, she had been experiencing diarrhea prior to admit. She was evaluated by Nephrology. Subsequent Cr improved. They were monitored on telemetry overnight which demonstrated appropriate VP in 70's.   The patient was examined and considered to be stable for discharge.  Wound care and restrictions were reviewed with the patient.  She was instructed to keep the pressure dressing in place until 05/09/24 wound check. The patient will be seen back by Device Clinic on 05/09/24 as previously planned for post implant check. She will follow up with Nephrology as planned on 05/17/24.   Physical Exam: Vitals:   05/03/24 0357 05/03/24 0842 05/03/24 0917 05/03/24 1156  BP: 104/70 108/62 117/63 102/64  Pulse: 67 75 74 75  Resp: 15 17  17   Temp: 98.2 F (36.8 C) 98.1 F (36.7 C)   97.8 F (36.6 C)  TempSrc: Oral Oral  Axillary  SpO2: 96% 97%  98%    GEN- pleasant adult female lying in bed in NAD. A&O x 3.  HEENT: Normocephalic, atraumatic Lungs- CTAB, Normal effort.  Heart- RRR, No M/G/R. L chest pressure dressing in place.  GI- Soft, NT, ND.  Extremities- No clubbing, cyanosis, or edema    Discharge Medications:  Allergies as of 05/03/2024       Reactions   Calcium  Channel Blockers Other (See Comments)   Cannot take non-DHP CCBs (diltiazem, verapamil) due to CHF requiring ICD   Codeine Anaphylaxis, Other (See Comments)   Lyrica  [pregabalin ]    Nightmares, and swelling   Other Anaphylaxis, Other (See Comments)   Walnuts, pecans, and ANY melons PATIENT IS A VEGETARIAN   Ramipril  Anaphylaxis, Swelling, Other (See Comments)   Ticlid [ticlopidine Hcl] Other (See Comments)   Unprovoked bleeding while on Ticlid (doesn't think she was on ASA at the time) Takes Plavix  without problems   Morphine And Codeine Other (See Comments)   Severe AMS, confusion, weakness Tolerates Fentanyl , Tramadol    Tape Dermatitis, Rash   Tolerates paper tape   Cucumber Extract Other (See Comments)   Digoxin  Other (See Comments)   Digoxin  And Related Nausea Only   Unknown   Diltiazem Hcl Other (See Comments)   Metformin Hcl Other (See Comments)   Metolazone Other (See Comments)   Cannot remember reaction   Morphine Sulfate Other (See Comments)   Myrbetriq [mirabegron] Other (See Comments)   Aggravates migraines  Nsaids Other (See Comments)   Onglyza [saxagliptin] Other (See Comments)   Exacerbates migraines   Penicillins Hives   Has patient had a PCN reaction causing immediate rash, facial/tongue/throat swelling, SOB or lightheadedness with hypotension: Yes Has patient had a PCN reaction causing severe rash involving mucus membranes or skin necrosis: Unknown Has patient had a PCN reaction that required hospitalization: Unknown Has patient had a PCN reaction occurring  within the last 10 years: No If all of the above answers are NO, then may proceed with Cephalosporin use.   Spironolactone Other (See Comments)   Cannot remember reaction   Sulfa Antibiotics Hives, Other (See Comments)   Tetracyclines & Related Other (See Comments)   Cannot remember reaction Tolerates macrolides (azithromycin)   Ticlopidine Other (See Comments)   Verapamil Other (See Comments)   Pacemaker/CHF   Vicodin [hydrocodone-acetaminophen ] Other (See Comments)   EXTREME LETHARGY   Latex Itching, Rash, Other (See Comments)        Medication List     PAUSE taking these medications    aspirin  EC 81 MG tablet Wait to take this until: May 04, 2024 Take 4 tablets (325 mg total) by mouth daily.   clopidogrel  75 MG tablet Wait to take this until: May 04, 2024 Commonly known as: PLAVIX  Take 1 tablet (75 mg total) by mouth daily.   furosemide  80 MG tablet Wait to take this until your doctor or other care provider tells you to start again. Commonly known as: LASIX  TAKE 1 TABLET TWICE A DAY. MAY TAKE AN ADDITIONAL 80MG  (1 TABLET) AS NEEDED FOR EDEMA   Lantus  SoloStar 100 UNIT/ML Solostar Pen Wait to take this until your doctor or other care provider tells you to start again. Generic drug: insulin  glargine Inject 6 Units into the skin at bedtime.   olmesartan  40 MG tablet Wait to take this until your doctor or other care provider tells you to start again. Commonly known as: BENICAR  Take 1 tablet (40 mg total) by mouth daily.       TAKE these medications    acetaminophen  500 MG tablet Commonly known as: TYLENOL  Take 1 tablet (500 mg total) by mouth every 6 (six) hours as needed for mild pain (pain score 1-3) or moderate pain (pain score 4-6).   ALAWAY OP Place 1 drop into both eyes daily as needed (allergies).   albuterol  108 (90 Base) MCG/ACT inhaler Commonly known as: VENTOLIN  HFA Inhale 2 puffs into the lungs every 4 (four) hours as needed for wheezing or  shortness of breath.   ALPRAZolam  1 MG tablet Commonly known as: XANAX  Take 1 mg by mouth 3 (three) times daily.   Artificial Tear Gel Place 2 drops into both eyes daily as needed (dry eyes).   B-D SINGLE USE SWABS  REGULAR Pads   BIOTIN PO Take 1 tablet by mouth daily.   cetirizine 10 MG tablet Commonly known as: ZYRTEC Take 10 mg by mouth daily.   doxazosin  4 MG tablet Commonly known as: CARDURA  Take 4 mg by mouth at bedtime.   Droplet Pen Needles 32G X 4 MM Misc Generic drug: Insulin  Pen Needle   EPINEPHrine  0.3 mg/0.3 mL Soaj injection Commonly known as: EPI-PEN Inject 0.3 mg into the muscle as needed for anaphylaxis.   ezetimibe  10 MG tablet Commonly known as: ZETIA  Take 1 tablet (10 mg total) by mouth daily.   Flaxseed Oil 1200 MG Caps Take 1,200 mg by mouth daily.   fluticasone  50 MCG/ACT nasal spray Commonly known  as: FLONASE  Place 2 sprays into both nostrils daily as needed for allergies or rhinitis.   hydrALAZINE  25 MG tablet Commonly known as: APRESOLINE  Take 1 tablet (25 mg total) by mouth 2 (two) times daily.   isosorbide  mononitrate 60 MG 24 hr tablet Commonly known as: IMDUR  Take 1 tablet (60 mg total) by mouth daily.   lidocaine  5 % Commonly known as: Lidoderm  Place 1 patch onto the skin daily. Remove & Discard patch within 12 hours or as directed by MD   metoprolol  succinate 100 MG 24 hr tablet Commonly known as: TOPROL -XL Take 2 tablets (200 mg total) by mouth daily. Take with or immediately following a meal.   metoprolol  tartrate 25 MG tablet Commonly known as: LOPRESSOR  Take 1 tablet (25 mg total) by mouth every 6 (six) hours as needed (palpitations).   multivitamin with minerals Tabs tablet Take 1 tablet by mouth daily.   nitroGLYCERIN  0.4 MG SL tablet Commonly known as: Nitrostat  Place 1 tablet under the tongue every 5 minutes as needed for chest pain, max 3 doses, go to er if no relief   Ozempic (0.25 or 0.5 MG/DOSE) 2 MG/1.5ML  Sopn Generic drug: Semaglutide(0.25 or 0.5MG /DOS) Inject 0.25 mg into the skin once a week. Sunday   pantoprazole 40 MG tablet Commonly known as: PROTONIX TAKE 1 TABLET BY MOUTH  DAILY   potassium chloride 10 MEQ tablet Commonly known as: KLOR-CON M TAKE 2 TABLETS TWICE A DAY (KEEP MD APPOINTMENT FOR REFILLS) What changed:  how much to take how to take this when to take this   Retacrit 10000 UNIT/ML injection Generic drug: epoetin alfa-epbx 10,000 Units every 21 ( twenty-one) days.   simvastatin 40 MG tablet Commonly known as: ZOCOR Take 1 tablet (40 mg total) by mouth at bedtime. What changed: when to take this   sodium chloride 5 % ophthalmic ointment Commonly known as: MURO 128 Place 1 drop into both eyes at bedtime as needed for eye irritation. For dry eyes   traMADol 50 MG tablet Commonly known as: ULTRAM Take 2 tablets (100 mg total) by mouth every 6 (six) hours as needed (MIGRAINES). What changed: reasons to take this   True Metrix Blood Glucose Test test strip Generic drug: glucose blood   TRUEplus Lancets 33G Misc   venlafaxine 100 MG tablet Commonly known as: EFFEXOR Take 100-200 mg by mouth See admin instructions. TAKE 200 mg by mouth in the morning and take 100 mg at noon   VITAMIN A PO Take 2,400 Units by mouth daily.   Vitamin D-3 25 MCG (1000 UT) Caps Take 1,000 Units by mouth daily.               Discharge Care Instructions  (From admission, onward)           Start     Ordered   05/03/24 0000  Leave dressing on - Keep it clean, dry, and intact until clinic visit        06 /26/25 1212            Disposition: Home with usual follow up as in AVS  Duration of Discharge Encounter:  APP time: 35 minutes  Signed, Daphne Barrack, NP-C, AGACNP-BC [facility] - Electrophysiology  05/03/2024, 12:12 PM  EP attending  Patient seen and examined.  Agree with the findings as noted above.  The patient is much improved today.   Her diuretic and ARB were held and her creatinine has improved nicely.  Appreciate the input  from her nephrologist.  She will be discharged home with follow-up as noted above.  On exam she has a regular rate and rhythm and her lungs are clear.  Extremities are warm with no edema.  Her incision is bandaged which is clean and dry.  No obvious significant swelling.  Danelle Birmingham, MD

## 2024-05-02 NOTE — ED Triage Notes (Signed)
 Patient reports swelling /blister at ICD surgical site this evening , mild soreness , no fever or chills .

## 2024-05-02 NOTE — Progress Notes (Cosign Needed)
  Patient Name: [name] Date of Encounter: 05/02/2024  Primary Cardiologist: Dorn Lesches, MD Electrophysiologist: None  Interval Summary   The patient reports she has not had fevers, chills to suggest stigmata of bacteremia. She noted warmth from insertion site / left chest, no drainage.   Vital Signs    Vitals:   05/02/24 0600 05/02/24 0732 05/02/24 0845 05/02/24 0900  BP: 113/68  119/80 112/72  Pulse: 73  67   Resp: 15  12   Temp:  98.2 F (36.8 C)    TempSrc:  Oral    SpO2: 100%  100%    No intake or output data in the 24 hours ending 05/02/24 0943 There were no vitals filed for this visit.  Physical Exam    GEN- thin adult female,  alert and oriented x 3 today.   Lungs- Clear to ausculation bilaterally, normal work of breathing Cardiac- Regular rate and rhythm, no murmurs, rubs or gallops.  Left chest pressure dressing intact   GI- soft, NT, ND, + BS Extremities- no clubbing or cyanosis. No edema  Telemetry    SR 60-70's (personally reviewed)  Device Info: MDT PPM implanted 06/28/07, CRT-D upgrade 04/17/08 Generator Change: 04/26/24   Hospital Course    [name] is a 73 y.o. female with PMH of HFmrEF due to ICM, CAD s/p MI, AVNRT s/p slow pathway modification admitted for device site swelling post generator change.   Assessment & Plan    Pocket Hematoma post CRT-D Generator Change  -continue pressure dressing that is in place until seen in device clinic on 7/2 -follow up blood cultures -no indication for abx at this time  -follow site  HFrEF due to ICM  CAD  HTN -no evidence of acute decompensation  -hold home lasix  & olmesartan  for now given AKI  -continue imdur , toprol , hydralazine  -follow I/O's  -no anginal symptoms   HLD -continue simvastatin , zetia    Anxiety / MDD  -continue home Xanax , Venlafaxine        For questions or updates, please contact [facility] Please consult [email or web address] for contact info  under     Signed, Daphne Barrack, NP-C, AGACNP-BC [facility] - Electrophysiology  05/02/2024, 1:52 PM   EP attending  Patient seen and examined.  She is well-known to me.  She presented with some swelling at her ICD pocket site.  This has been evaluated by Dr. Almetta.  See his note.  The patient has developed acute on chronic renal failure with a creatinine going from the high twos to the high threes.  She will be admitted for observation, discontinuation of her diuretic and ARB, and renal consultation.  If she improves with we would plan to it discharge her tomorrow.  Danelle Birmingham, MD

## 2024-05-02 NOTE — Progress Notes (Signed)
 Assessed in waiting area. Small blister superior to pocket incision. Pocket is tense and similar to picture from Monday. Does have small fluid filled blister superior to incision and at lateral margin likely where suture was cut. Will get Bcx, CBC, BMP and CRP. Will take down steri strips and assess once in room and re apply pressure dressing if not obvious signs of infection. Right now looks to be hematoma only, no drainage/discharge or surrounding erythema. Will observe overnight. Plan for 7 days bactrim in case superficial infection/stitch abscess.

## 2024-05-02 NOTE — ED Notes (Signed)
 Spoke with Camie Oas of [facility] and Primary RN for patient on [facility] and they are ready for the patient.

## 2024-05-02 NOTE — Consult Note (Signed)
 Reason for Consult: AKI/CKD stage IV Referring Physician: Waddell, MD  [name] is an 73 y.o. female has a PMH significant for CAD s/p prior MI, HFmrEF/ICM (EF 40-45%), HTN, DM type 2, CKD stage IV, secondary HPTH, anemia related to CKD stage IV, AVNRT s/p ablation, and biventricular AICD who underwent elective replacement of her biventricular ICD on 04/26/24 who presented to [facility] ED today with swelling of her ICD site along with a blister.  In the ED, WBC 6.6, Hgb 9.9, plt 127, BUN 61, Cr 3.75.  She was admitted under observation and we were consulted due to the development of AKI/CKD stage IV.  The trend in Scr is seen below.    She denies any N/V/D but has been on olmesartan  40 mg and furosemide  80 mg bid prior to admission and reports low Bp readings at home.  She also had some diarrhea on Sunday after discharge.  Currently asymptomatic.   Her baseline Scr has ranged 2.6-3.05 over the past year and was 2.87 on 02/06/24.  She did receive IV Vancomycin  1 gram and Gentamicin  160 mg but no IV contrast.  Trend in Creatinine: Creatinine, Ser  Date/Time Value Ref Range Status  05/02/2024 01:20 AM 3.75 (H) 0.44 - 1.00 mg/dL Final  94/76/7974 97:80 PM 2.91 (H) 0.57 - 1.00 mg/dL Final  87/86/7975 95:78 AM 2.69 (H) 0.44 - 1.00 mg/dL Final  87/87/7975 94:77 AM 2.79 (H) 0.44 - 1.00 mg/dL Final  87/88/7975 96:77 PM 2.58 (H) 0.44 - 1.00 mg/dL Final  94/69/7976 88:86 AM 2.81 (H) 0.44 - 1.00 mg/dL Final  96/96/7978 87:54 PM 2.64 (H) 0.44 - 1.00 mg/dL Final  89/91/7980 87:67 PM 2.04 (H) 0.57 - 1.00 mg/dL Final  90/71/7980 93:91 AM 2.03 (H) 0.44 - 1.00 mg/dL Final  90/72/7980 95:68 AM 1.90 (H) 0.44 - 1.00 mg/dL Final  90/73/7980 95:73 AM 2.00 (H) 0.44 - 1.00 mg/dL Final  90/74/7980 89:75 PM 1.99 (H) 0.44 - 1.00 mg/dL Final  90/74/7980 95:49 PM 2.08 (H) 0.44 - 1.00 mg/dL Final  96/86/7980 95:69 PM 1.89 (H) 0.57 - 1.00 mg/dL Final  97/89/7980 88:88 AM 2.14 (H) 0.44 - 1.00 mg/dL Final  97/95/7980 91:73  PM 2.03 (H) 0.44 - 1.00 mg/dL Final  90/76/7982 94:77 AM 1.50 (H) 0.44 - 1.00 mg/dL Final  90/77/7982 95:71 PM 1.59 (H) 0.44 - 1.00 mg/dL Final  90/78/7982 87:66 PM 1.48 (H) 0.44 - 1.00 mg/dL Final  97/73/7982 94:62 AM 1.69 (H) 0.44 - 1.00 mg/dL Final  97/76/7982 95:78 PM 0.70 0.44 - 1.00 mg/dL Final  97/76/7982 95:91 PM 1.62 (H) 0.44 - 1.00 mg/dL Final  97/90/7982 96:53 AM 1.59 (H) 0.44 - 1.00 mg/dL Final  90/83/7983 97:64 PM 1.57 (H) 0.40 - 1.20 mg/dL Final  96/83/7983 96:55 PM 1.55 (H) 0.40 - 1.20 mg/dL Final  97/73/7983 96:95 AM 1.50 (H) 0.50 - 1.10 mg/dL Final  97/74/7983 94:82 PM 1.57 (H) 0.50 - 1.10 mg/dL Final  95/89/7985 94:94 AM 1.72 (H) 0.50 - 1.10 mg/dL Final  95/90/7985 90:50 AM 1.70 (H) 0.50 - 1.10 mg/dL Final  95/90/7985 90:65 AM 1.77 (H) 0.50 - 1.10 mg/dL Final  98/93/7985 92:54 AM 1.57 (H) 0.50 - 1.10 mg/dL Final  98/94/7985 92:63 AM 1.59 (H) 0.50 - 1.10 mg/dL Final  98/95/7985 90:84 PM 1.55 (H) 0.50 - 1.10 mg/dL Final  98/95/7985 97:71 PM 1.60 (H) 0.50 - 1.10 mg/dL Final  98/95/7985 97:83 PM 1.74 (H) 0.50 - 1.10 mg/dL Final  93/97/7987 93:60 PM 1.10 0.4 - 1.2 mg/dL Final  12/23/2010 06:03 AM 1.35 (H) 0.4 - 1.2 mg/dL Final  87/83/7988 94:60 AM 1.16 0.4 - 1.2 mg/dL Final  87/84/7988 94:93 AM 1.23 (H) 0.4 - 1.2 mg/dL Final  87/85/7988 91:71 PM 1.19 0.4 - 1.2 mg/dL Final  87/85/7988 97:46 PM 1.21 (H) 0.4 - 1.2 mg/dL Final  94/83/7988 87:77 AM 1.4 (H) 0.4 - 1.2 mg/dL Final  93/82/7990 93:69 PM 0.96  Final  04/24/2008 05:19 PM 1.0  Final  02/07/2008 05:45 AM 0.98  Final  02/06/2008 05:20 AM 0.99  Final  02/05/2008 08:40 AM 0.94  Final  01/30/2008 09:46 AM 0.8  Final  12/07/2007 03:06 AM 0.8  Final  09/13/2007 06:30 AM 0.86  Final  09/12/2007 03:20 AM 0.85  Final  09/11/2007 12:40 PM 0.83  Final    PMH:   Past Medical History:  Diagnosis Date   AICD (automatic cardioverter/defibrillator) present    [employer]- Dr. JUDITHANN Birmingham follows   Anginal pain (HCC)     Anxiety    Asthma    Back pain    pinched nerve-lower back - Dr. Bonner follows.   Biventricular implantable cardioverter-defibrillator in situ 06/18/2011   Cerebral infarction (HCC) 11/11/2012   Cervical dysplasia    Chronic diastolic heart failure (HCC) 03/22/2016   Chronic kidney disease    Dr. Tobie follows.   Chronic renal insufficiency, stage III (moderate) (HCC) 11/11/2012   Complication of anesthesia    I wake up during surgeries (02/14/2013)   Coronary artery disease involving coronary bypass graft of native heart with angina pectoris (HCC) 06/18/2011   Depression    DM (diabetes mellitus) (HCC) 11/11/2012   Fibroid    Function kidney decreased    GERD (gastroesophageal reflux disease)    Hemiplegia, unspecified, affecting nondominant side 11/11/2012   Hepatitis    Hepatitis A -college yrswater source exposure   Hiatal hernia    History of shingles    2-3 yrs ago last out break around waist   History of stomach ulcers    Hyperlipidemia 07/30/2016   Hypertension    ICD (implantable cardiac defibrillator) in place    Iron  deficiency anemia    Ischemic cardiomyopathy    status post biventricular ICD placed by DR Edumunds who used to see Dr Lavon here to establish  cardiovascular care.   Migraines    MVP (mitral valve prolapse)    Antibiotics not required for procedures   Myocardial infarction (HCC)    I've had 2; the others they were able to catch before completing (02/14/2013)   Pacemaker    Paroxysmal SVT (supraventricular tachycardia) (HCC) 06/18/2011   Pneumonia 1950's & 1985   Shortness of breath    lying down flat; at times w/exertion (02/14/2013). 10-06-15 exertion only..   Stroke (HCC)    2 confirmed; 9 TIA's; results in dragging LLE; numbness in tip of tongue (02/14/2013),10-06-15 right hand tends to be weaker when tired.   TIA (transient ischemic attack) 11/11/2012   Type II diabetes mellitus (HCC)     PSH:   Past Surgical History:  Procedure Laterality Date    ABDOMINAL HYSTERECTOMY  1985   TAH    BIV ICD GENERATOR CHANGEOUT N/A 04/26/2024   Procedure: BIV ICD GENERATOR CHANGEOUT;  Surgeon: Birmingham Danelle ORN, MD;  Location: [facility] INVASIVE CV LAB;  Service: Cardiovascular;  Laterality: N/A;   BIV ICD GENERTAOR CHANGE OUT  06/2007; 04/2008   2 lead initial placement, at [facility]; done at [facility], after developing CHF (02/14/2013)   BIV PACEMAKER GENERATOR CHANGE  OUT N/A 01/29/2015   Procedure: BIV PACEMAKER GENERATOR CHANGE OUT;  Surgeon: Danelle LELON Birmingham, MD;  Location: [facility] CATH LAB;  Service: Cardiovascular;  Laterality: N/A;   BREAST EXCISIONAL BIOPSY Left 01/2007; 06/2007; 03/2008   benign (02/14/2013)   BREAST SURGERY     CARDIAC CATHETERIZATION     probably in the teens (02/14/2013)   CARDIAC DEFIBRILLATOR PLACEMENT     COLONOSCOPY  ~ 2002   COLONOSCOPY WITH PROPOFOL  N/A 10/07/2015   Procedure: COLONOSCOPY WITH PROPOFOL ;  Surgeon: Renaye Sous, MD;  Location: [facility] ENDOSCOPY;  Service: Endoscopy;  Laterality: N/A;   CORONARY ANGIOPLASTY WITH STENT PLACEMENT     started out w/5; bypass corrected some; 1 stent since the bypass (02/14/2013)   CORONARY ARTERY BYPASS GRAFT  ` 1998   LIMA-LAD, SVG-D1, SVG-PDA   DILATION AND CURETTAGE OF UTERUS  1975 X 2; 1976; 1977   INSERT / REPLACE / REMOVE PACEMAKER     biventricular defibrillator--06/10/ 2009   LEFT HEART CATH AND CORS/GRAFTS ANGIOGRAPHY N/A 08/04/2018   Procedure: LEFT HEART CATH AND CORS/GRAFTS ANGIOGRAPHY;  Surgeon: Verlin Lonni BIRCH, MD;  Location: [facility] INVASIVE CV LAB;  Service: Cardiovascular;  Laterality: N/A;   LEFT HEART CATHETERIZATION WITH CORONARY/GRAFT ANGIOGRAM N/A 01/03/2015   Procedure: LEFT HEART CATHETERIZATION WITH EL BILE;  Surgeon: Victory LELON Claudene DOUGLAS, MD;  Location: [facility] CATH LAB;  Service: Cardiovascular;  Laterality: N/A;   SUPRAVENTRICULAR TACHYCARDIA ABLATION  06/2007   SVT ABLATION N/A 11/04/2023   Procedure: SVT ABLATION;  Surgeon: Birmingham Danelle LELON, MD;  Location: [facility]  INVASIVE CV LAB;  Service: Cardiovascular;  Laterality: N/A;   TEE WITHOUT CARDIOVERSION  11/14/2012   Procedure: TRANSESOPHAGEAL ECHOCARDIOGRAM (TEE);  Surgeon: Oneil Parchment, MD;  Location: [facility] ENDOSCOPY;  Service: Cardiovascular;  Laterality: N/A;    Allergies:  Allergies  Allergen Reactions   Calcium  Channel Blockers Other (See Comments)    Cannot take non-DHP CCBs (diltiazem, verapamil) due to CHF requiring ICD   Codeine Anaphylaxis and Other (See Comments)   Lyrica  [Pregabalin ]     Nightmares, and swelling   Other Anaphylaxis and Other (See Comments)    Walnuts, pecans, and ANY melons PATIENT IS A VEGETARIAN    Ramipril  Anaphylaxis, Swelling and Other (See Comments)   Ticlid [Ticlopidine Hcl] Other (See Comments)    Unprovoked bleeding while on Ticlid (doesn't think she was on ASA at the time) Takes Plavix  without problems   Morphine And Codeine Other (See Comments)    Severe AMS, confusion, weakness Tolerates Fentanyl , Tramadol    Tape Dermatitis and Rash    Tolerates paper tape   Cucumber Extract Other (See Comments)   Digoxin  Other (See Comments)   Digoxin  And Related Nausea Only    Unknown   Diltiazem Hcl Other (See Comments)   Metformin Hcl Other (See Comments)   Metolazone Other (See Comments)    Cannot remember reaction   Morphine Sulfate Other (See Comments)   Myrbetriq [Mirabegron] Other (See Comments)    Aggravates migraines   Nsaids Other (See Comments)   Onglyza [Saxagliptin] Other (See Comments)    Exacerbates migraines   Penicillins Hives    Has patient had a PCN reaction causing immediate rash, facial/tongue/throat swelling, SOB or lightheadedness with hypotension: Yes Has patient had a PCN reaction causing severe rash involving mucus membranes or skin necrosis: Unknown Has patient had a PCN reaction that required hospitalization: Unknown Has patient had a PCN reaction occurring within the last 10 years: No If all of the above answers  are NO, then may  proceed with Cephalosporin use.    Spironolactone Other (See Comments)    Cannot remember reaction   Sulfa Antibiotics Hives and Other (See Comments)   Tetracyclines & Related Other (See Comments)    Cannot remember reaction Tolerates macrolides (azithromycin)   Ticlopidine Other (See Comments)   Verapamil Other (See Comments)    Pacemaker/CHF   Vicodin [Hydrocodone-Acetaminophen ] Other (See Comments)    EXTREME LETHARGY   Latex Itching, Rash and Other (See Comments)    Medications:   Prior to Admission medications   Medication Sig Start Date End Date Taking? Authorizing Provider  acetaminophen  (TYLENOL ) 500 MG tablet Take 1 tablet (500 mg total) by mouth every 6 (six) hours as needed for mild pain (pain score 1-3) or moderate pain (pain score 4-6). 04/28/24  Yes Goodrich, Callie E, PA-C  albuterol  (PROVENTIL  HFA;VENTOLIN  HFA) 108 (90 BASE) MCG/ACT inhaler Inhale 2 puffs into the lungs every 4 (four) hours as needed for wheezing or shortness of breath.   Yes [provider]  Alcohol  Swabs  (B-D SINGLE USE SWABS  REGULAR) PADS  03/05/19  Yes [provider]  ALPRAZolam  (XANAX ) 1 MG tablet Take 1 mg by mouth 3 (three) times daily.  09/20/18  Yes [provider]  Artificial Tear GEL Place 2 drops into both eyes daily as needed (dry eyes).    Yes [provider]  aspirin  EC 81 MG tablet Take 4 tablets (325 mg total) by mouth daily. 11/05/23  Yes Darryle Currier L, PA-C  BIOTIN PO Take 1 tablet by mouth daily.   Yes [provider]  cetirizine (ZYRTEC) 10 MG tablet Take 10 mg by mouth daily.   Yes [provider]  Cholecalciferol  (VITAMIN D -3) 1000 UNITS CAPS Take 1,000 Units by mouth daily.    Yes [provider]  clopidogrel  (PLAVIX ) 75 MG tablet Take 1 tablet (75 mg total) by mouth daily. 02/17/24  Yes Court Dorn PARAS, MD  doxazosin  (CARDURA ) 4 MG tablet Take 4 mg by mouth at bedtime.    Yes [provider]  EPINEPHrine  0.3  mg/0.3 mL IJ SOAJ injection Inject 0.3 mg into the muscle as needed for anaphylaxis.  08/08/18  Yes [provider]  epoetin  alfa-epbx (RETACRIT ) 10000 UNIT/ML injection 10,000 Units every 21 ( twenty-one) days.   Yes [provider]  ezetimibe  (ZETIA ) 10 MG tablet Take 1 tablet (10 mg total) by mouth daily. 02/17/24  Yes Court Dorn PARAS, MD  Flaxseed, Linseed, (FLAXSEED OIL) 1200 MG CAPS Take 1,200 mg by mouth daily.   Yes [provider]  fluticasone  (FLONASE ) 50 MCG/ACT nasal spray Place 2 sprays into both nostrils daily as needed for allergies or rhinitis.   Yes [provider]  furosemide  (LASIX ) 80 MG tablet TAKE 1 TABLET TWICE A DAY. MAY TAKE AN ADDITIONAL 80MG  (1 TABLET) AS NEEDED FOR EDEMA 02/17/24  Yes Court Dorn PARAS, MD  hydrALAZINE  (APRESOLINE ) 25 MG tablet Take 1 tablet (25 mg total) by mouth 2 (two) times daily. 02/17/24  Yes Court Dorn PARAS, MD  isosorbide  mononitrate (IMDUR ) 60 MG 24 hr tablet Take 1 tablet (60 mg total) by mouth daily. 02/17/24  Yes Court Dorn PARAS, MD  Ketotifen Fumarate (ALAWAY OP) Place 1 drop into both eyes daily as needed (allergies).    Yes [provider]  lidocaine  (LIDODERM ) 5 % Place 1 patch onto the skin daily. Remove & Discard patch within 12 hours or as directed by MD 04/30/24  Yes Tillery,  Ozell Barter, PA-C  metoprolol  succinate (TOPROL -XL) 100 MG 24 hr tablet Take 2 tablets (200 mg total) by mouth daily. Take with or immediately following a meal. 02/17/24  Yes Court Dorn PARAS, MD  Multiple Vitamin (MULTIVITAMIN WITH MINERALS) TABS Take 1 tablet by mouth daily.   Yes [provider]  nitroGLYCERIN  (NITROSTAT ) 0.4 MG SL tablet Place 1 tablet under the tongue every 5 minutes as needed for chest pain, max 3 doses, go to er if no relief 01/29/22  Yes Claudene Victory ORN, MD  olmesartan  (BENICAR ) 40 MG tablet Take 1 tablet (40 mg total) by mouth daily. 10/10/23  Yes Court Dorn PARAS, MD  OZEMPIC, 0.25 OR  0.5 MG/DOSE, 2 MG/1.5ML SOPN Inject 0.25 mg into the skin once a week. Sunday 08/30/19  Yes [provider]  pantoprazole  (PROTONIX ) 40 MG tablet TAKE 1 TABLET BY MOUTH  DAILY 09/02/21  Yes Claudene Victory ORN, MD  potassium chloride  (KLOR-CON  M) 10 MEQ tablet TAKE 2 TABLETS TWICE A DAY (KEEP MD APPOINTMENT FOR REFILLS) Patient taking differently: Take 20 mEq by mouth daily. TAKE 2 TABLETS TWICE A DAY (KEEP MD APPOINTMENT FOR REFILLS) 06/06/23  Yes Court Dorn PARAS, MD  simvastatin  (ZOCOR ) 40 MG tablet Take 1 tablet (40 mg total) by mouth at bedtime. Patient taking differently: Take 40 mg by mouth daily. 10/10/23  Yes Court Dorn PARAS, MD  sodium chloride  (MURO 128) 5 % ophthalmic ointment Place 1 drop into both eyes at bedtime as needed for eye irritation. For dry eyes   Yes [provider]  traMADol  (ULTRAM ) 50 MG tablet Take 2 tablets (100 mg total) by mouth every 6 (six) hours as needed (MIGRAINES). Patient taking differently: Take 100 mg by mouth every 6 (six) hours as needed for moderate pain (pain score 4-6). 04/28/24  Yes Goodrich, Callie E, PA-C  TRUE METRIX BLOOD GLUCOSE TEST test strip  02/27/19  Yes [provider]  TRUEplus Lancets 33G MISC  02/27/19  Yes [provider]  venlafaxine  (EFFEXOR ) 100 MG tablet Take 100-200 mg by mouth See admin instructions. TAKE 200 mg by mouth in the morning and take 100 mg at noon 04/12/11  Yes [provider]  VITAMIN A PO Take 2,400 Units by mouth daily.   Yes [provider]  DROPLET PEN NEEDLES 32G X 4 MM MISC  03/05/19   [provider]  LANTUS  SOLOSTAR 100 UNIT/ML Solostar Pen Inject 6 Units into the skin at bedtime. Patient not taking: Reported on 05/02/2024 04/10/19   [provider]  metoprolol  tartrate (LOPRESSOR ) 25 MG tablet Take 1 tablet (25 mg total) by mouth every 6 (six) hours as needed (palpitations). Patient not taking: Reported on 05/02/2024 10/21/23 10/20/24  Ursuy, Renee Lynn,  PA-C    Inpatient medications:  ALPRAZolam   1 mg Oral TID   doxazosin   4 mg Oral QHS   ezetimibe   10 mg Oral Daily   hydrALAZINE   25 mg Oral BID   isosorbide  mononitrate  60 mg Oral Daily   lidocaine   1 patch Transdermal Q24H   metoprolol  succinate  200 mg Oral Daily   pantoprazole   40 mg Oral Daily   simvastatin   40 mg Oral Daily   venlafaxine   100 mg Oral QPM   venlafaxine   200 mg Oral Daily    Discontinued Meds:   Medications Discontinued During This Encounter  Medication Reason   furosemide  (LASIX ) tablet 80 mg    irbesartan  (AVAPRO ) tablet 150 mg  Social History:  reports that she has never smoked. She has never used smokeless tobacco. She reports that she does not drink alcohol  and does not use drugs.  Family History:   Family History  Problem Relation Age of Onset   Heart disease Mother    Hypertension Mother    Heart attack Mother    Stroke Mother    Heart disease Father    Hypertension Father    Diabetes Father    Kidney failure Father    Heart attack Father    Stroke Father    Heart disease Brother    Diabetes Brother    Kidney failure Brother    Heart attack Brother    Diabetes Paternal Grandmother     Pertinent items are noted in HPI. Weight change:  No intake or output data in the 24 hours ending 05/02/24 1219 BP 110/68   Pulse 73   Temp (!) 97.5 F (36.4 C) (Oral)   Resp 16   SpO2 100%  Vitals:   05/02/24 0845 05/02/24 0900 05/02/24 1130 05/02/24 1134  BP: 119/80 112/72 110/68   Pulse: 67  73   Resp: 12  16   Temp:    (!) 97.5 F (36.4 C)  TempSrc:    Oral  SpO2: 100%  100%      General appearance: alert, cooperative, and no distress Head: Normocephalic, without obvious abnormality, atraumatic Eyes: negative findings: lids and lashes normal, conjunctivae and sclerae normal, and corneas clear Resp: clear to auscultation bilaterally Cardio: regular rate and rhythm, S1, S2 normal, no murmur, click, rub or gallop GI: soft, non-tender;  bowel sounds normal; no masses,  no organomegaly Extremities: extremities normal, atraumatic, no cyanosis or edema  Labs: Basic Metabolic Panel: Recent Labs  Lab 05/02/24 0120  NA 135  K 4.5  CL 99  CO2 25  GLUCOSE 109*  BUN 61*  CREATININE 3.75*  CALCIUM  8.7*   Liver Function Tests: No results for input(s): AST, ALT, ALKPHOS, BILITOT, PROT, ALBUMIN  in the last 168 hours. No results for input(s): LIPASE, AMYLASE in the last 168 hours. No results for input(s): AMMONIA in the last 168 hours. CBC: Recent Labs  Lab 05/02/24 0120  WBC 6.6  NEUTROABS 3.8  HGB 9.9*  HCT 28.9*  MCV 93.5  PLT 127*   PT/INR: @LABRCNTIP (inr:5) Cardiac Enzymes: )No results for input(s): CKTOTAL, CKMB, CKMBINDEX, TROPONINI in the last 168 hours. CBG: Recent Labs  Lab 04/26/24 0752 04/26/24 1207  GLUCAP 105* 149*    Iron  Studies: No results for input(s): IRON , TIBC, TRANSFERRIN, FERRITIN in the last 168 hours.  Xrays/Other Studies: DG Chest Port 1 View Result Date: 05/02/2024 CLINICAL DATA:  Swelling defibrillator site EXAM: PORTABLE CHEST 1 VIEW COMPARISON:  10/19/2023 FINDINGS: Cardiac shadow is stable. Defibrillator is again noted. Postsurgical changes are seen. Lungs are clear. Mild soft tissue swelling is noted in the region of the defibrillator pack. No bony abnormality is seen. IMPRESSION: Mild soft tissue swelling consistent with the given clinical history. Electronically Signed   By: Oneil Devonshire M.D.   On: 05/02/2024 02:05     Assessment/Plan:  AKI/CKD stage IV - unclear etiology but may be ischemic ATN related to relative hypotension with concomitant ARB therapy and ABLA.  Her Hgb dropped from 11.7 on 04/20/24 to 9.9 today.  Agree with holding ARB and lasix  for now and following UOP and Scr trend.  No uremic symptoms or indication for HD.   Avoid nephrotoxic medications including NSAIDs and iodinated intravenous  contrast exposure unless the latter is  absolutely indicated.   Preferred narcotic agents for pain control are hydromorphone, fentanyl , and methadone. Morphine should not be used.  Avoid Baclofen and avoid oral sodium phosphate and magnesium  citrate based laxatives / bowel preps.  Continue strict Input and Output monitoring. Will monitor the patient closely with you and intervene or adjust therapy as indicated by changes in clinical status/labs  ABLA on anemia of CKD stage IV - Hgb dropped from 11.7 to 9.9 post procedure.  Continue to follow H/H.   Had her last retacrit  10,000 units on 04/20/24.  She is due another dose next week. Pocket hematoma s/p biventricular ICD exchange.  Continue with pressure dressing. HFrEF due to ICM - appears euvolemic.  No changes. HTN - as above, hold ARB for now, bp's improved.    [facility] LABOR Memorie Yokoyama 05/02/2024, 12:19 PM

## 2024-05-02 NOTE — H&P (Addendum)
 Cardiology Admission History and Physical:   Patient ID: [name] MRN: [id number]; DOB: [birth year]   Admission date: 05/02/2024  Primary Care Provider: Claudene Pellet, MD [facility] Cardiologist: Dorn Lesches, MD  [facility] Electrophysiologist: Danelle Birmingham, MD   Chief Complaint: ICD device site swelling  Patient Profile:   [name] is a 73 y.o. female with HFmrEF/ICM (LVEF 40-45->45-50%), CAD s/p prior MI, AVNRT s/p slow pathway modification (11/04/23, Birmingham) who had recent CRTD generator replacement (04/26/24) and presents with device site swelling.   History of Present Illness:   [name] underwent LEFT MDT CRTD generator replacement on 04/26/24 with Dr. Birmingham.  She had a fair amount of weight loss over the preceding years so the device was relocated subpectoral.  Following device placement she had a moderate amount of pain.  She had follow-up on 06/23 and the pressure dressing was removed. The pain was better controlled but the site was tense and swollen but without drainage or bleeding. No signs of infection.   She paged the operator 06/24 PM and reported that she had a fluid filled area on the edge of the incision along with warmth/tenderness around the incision. I asked she come in for evaluation.   On presentation to the ED she was HD stable. VS on arrival: BP 127/76, P 71, RR 16, T 98.7, O2 100%/RA.   Labs notable for AKI on CKD (sCr 3.75, bl 2.6-2.9), otherwise WNLs (WBC 6.6, CRP 0.8). Bcx x2 collected.   During my evaluation she was resting comfortably and not in obvious pain. She reported mild discomfort at the device site. She denied any fevers, chills, or discharge from the device. There was a small fluid filled blister on the superior/lateral aspect of the device pocket not contiguous with the incision that is likely from the finishing suture. The device swelling appears similar to the pictures from Monday. I removed the steri strips  and there was no drainage or purulent discharge. Cleaned the area with chlorhexidine  and re-applied steri strips with replacement of the pressure dressing.  Device information: CRTD: MDT DTPA2D1, SN [id number], DOI 04/26/24 RA: MDT 4923-54, SN [id number], DOI 06/28/07 RV: MDT 3052-41, SN [id number], DOI 06/28/07 LV: MDT 4194 Attain 88, SN [id number], DOI 04/17/08   Past Medical History:  Diagnosis Date   AICD (automatic cardioverter/defibrillator) present    [employer]- Dr. JUDITHANN Birmingham follows   Anginal pain (HCC)    Anxiety    Asthma    Back pain    pinched nerve-lower back - Dr. Bonner follows.   Biventricular implantable cardioverter-defibrillator in situ 06/18/2011   Cerebral infarction (HCC) 11/11/2012   Cervical dysplasia    Chronic diastolic heart failure (HCC) 03/22/2016   Chronic kidney disease    Dr. Tobie follows.   Chronic renal insufficiency, stage III (moderate) (HCC) 11/11/2012   Complication of anesthesia    I wake up during surgeries (02/14/2013)   Coronary artery disease involving coronary bypass graft of native heart with angina pectoris (HCC) 06/18/2011   Depression    DM (diabetes mellitus) (HCC) 11/11/2012   Fibroid    Function kidney decreased    GERD (gastroesophageal reflux disease)    Hemiplegia, unspecified, affecting nondominant side 11/11/2012   Hepatitis    Hepatitis A -college yrswater source exposure   Hiatal hernia    History of shingles    2-3 yrs ago last out break around waist   History of stomach ulcers    Hyperlipidemia 07/30/2016   Hypertension  ICD (implantable cardiac defibrillator) in place    Iron  deficiency anemia    Ischemic cardiomyopathy    status post biventricular ICD placed by DR Edumunds who used to see Dr Lavon here to establish  cardiovascular care.   Migraines    MVP (mitral valve prolapse)    Antibiotics not required for procedures   Myocardial infarction (HCC)    I've had 2; the others they were able to catch before  completing (02/14/2013)   Pacemaker    Paroxysmal SVT (supraventricular tachycardia) (HCC) 06/18/2011   Pneumonia 1950's & 1985   Shortness of breath    lying down flat; at times w/exertion (02/14/2013). 10-06-15 exertion only..   Stroke (HCC)    2 confirmed; 9 TIA's; results in dragging LLE; numbness in tip of tongue (02/14/2013),10-06-15 right hand tends to be weaker when tired.   TIA (transient ischemic attack) 11/11/2012   Type II diabetes mellitus [facility])    Past Surgical History:  Procedure Laterality Date   ABDOMINAL HYSTERECTOMY  1985   TAH    BIV ICD GENERATOR CHANGEOUT N/A 04/26/2024   Procedure: BIV ICD GENERATOR CHANGEOUT;  Surgeon: Waddell Danelle ORN, MD;  Location: [facility] INVASIVE CV LAB;  Service: Cardiovascular;  Laterality: N/A;   BIV ICD GENERTAOR CHANGE OUT  06/2007; 04/2008   2 lead initial placement, at [facility]; done at [facility], after developing CHF (02/14/2013)   BIV PACEMAKER GENERATOR CHANGE OUT N/A 01/29/2015   Procedure: BIV PACEMAKER GENERATOR CHANGE OUT;  Surgeon: Danelle ORN Waddell, MD;  Location: [facility] CATH LAB;  Service: Cardiovascular;  Laterality: N/A;   BREAST EXCISIONAL BIOPSY Left 01/2007; 06/2007; 03/2008   benign (02/14/2013)   BREAST SURGERY     CARDIAC CATHETERIZATION     probably in the teens (02/14/2013)   CARDIAC DEFIBRILLATOR PLACEMENT     COLONOSCOPY  ~ 2002   COLONOSCOPY WITH PROPOFOL  N/A 10/07/2015   Procedure: COLONOSCOPY WITH PROPOFOL ;  Surgeon: Renaye Sous, MD;  Location: [facility] ENDOSCOPY;  Service: Endoscopy;  Laterality: N/A;   CORONARY ANGIOPLASTY WITH STENT PLACEMENT     started out w/5; bypass corrected some; 1 stent since the bypass (02/14/2013)   CORONARY ARTERY BYPASS GRAFT  ` 1998   LIMA-LAD, SVG-D1, SVG-PDA   DILATION AND CURETTAGE OF UTERUS  1975 X 2; 1976; 1977   INSERT / REPLACE / REMOVE PACEMAKER     biventricular defibrillator--06/10/ 2009   LEFT HEART CATH AND CORS/GRAFTS ANGIOGRAPHY N/A 08/04/2018   Procedure: LEFT HEART CATH AND CORS/GRAFTS  ANGIOGRAPHY;  Surgeon: Verlin Lonni BIRCH, MD;  Location: [facility] INVASIVE CV LAB;  Service: Cardiovascular;  Laterality: N/A;   LEFT HEART CATHETERIZATION WITH CORONARY/GRAFT ANGIOGRAM N/A 01/03/2015   Procedure: LEFT HEART CATHETERIZATION WITH EL BILE;  Surgeon: Victory ORN Claudene DOUGLAS, MD;  Location: [facility] CATH LAB;  Service: Cardiovascular;  Laterality: N/A;   SUPRAVENTRICULAR TACHYCARDIA ABLATION  06/2007   SVT ABLATION N/A 11/04/2023   Procedure: SVT ABLATION;  Surgeon: Waddell Danelle ORN, MD;  Location: [facility] INVASIVE CV LAB;  Service: Cardiovascular;  Laterality: N/A;   TEE WITHOUT CARDIOVERSION  11/14/2012   Procedure: TRANSESOPHAGEAL ECHOCARDIOGRAM (TEE);  Surgeon: Oneil Parchment, MD;  Location: [facility] ENDOSCOPY;  Service: Cardiovascular;  Laterality: N/A;    Medications Prior to Admission: Prior to Admission medications   Medication Sig Start Date End Date Taking? Authorizing Provider  acetaminophen  (TYLENOL ) 500 MG tablet Take 1 tablet (500 mg total) by mouth every 6 (six) hours as needed for mild pain (pain score 1-3) or moderate pain (pain  score 4-6). 04/28/24  Yes Goodrich, Callie E, PA-C  albuterol  (PROVENTIL  HFA;VENTOLIN  HFA) 108 (90 BASE) MCG/ACT inhaler Inhale 2 puffs into the lungs every 4 (four) hours as needed for wheezing or shortness of breath.   Yes [provider]  Alcohol  Swabs  (B-D SINGLE USE SWABS  REGULAR) PADS  03/05/19  Yes [provider]  ALPRAZolam  (XANAX ) 1 MG tablet Take 1 mg by mouth 3 (three) times daily.  09/20/18  Yes [provider]  Artificial Tear GEL Place 2 drops into both eyes daily as needed (dry eyes).    Yes [provider]  aspirin  EC 81 MG tablet Take 4 tablets (325 mg total) by mouth daily. 11/05/23  Yes Darryle Currier L, PA-C  BIOTIN PO Take 1 tablet by mouth daily.   Yes [provider]  cetirizine (ZYRTEC) 10 MG tablet Take 10 mg by mouth daily.   Yes [provider]  Cholecalciferol  (VITAMIN D -3) 1000  UNITS CAPS Take 1,000 Units by mouth daily.    Yes [provider]  clopidogrel  (PLAVIX ) 75 MG tablet Take 1 tablet (75 mg total) by mouth daily. 02/17/24  Yes Court Dorn PARAS, MD  doxazosin  (CARDURA ) 4 MG tablet Take 4 mg by mouth at bedtime.    Yes [provider]  DROPLET PEN NEEDLES 32G X 4 MM MISC  03/05/19  Yes [provider]  EPINEPHrine  0.3 mg/0.3 mL IJ SOAJ injection Inject 0.3 mg into the muscle as needed for anaphylaxis.  08/08/18  Yes [provider]  epoetin  alfa-epbx (RETACRIT ) 10000 UNIT/ML injection 10,000 Units every 21 ( twenty-one) days.   Yes [provider]  ezetimibe  (ZETIA ) 10 MG tablet Take 1 tablet (10 mg total) by mouth daily. 02/17/24  Yes Court Dorn PARAS, MD  Flaxseed, Linseed, (FLAXSEED OIL) 1200 MG CAPS Take 1,200 mg by mouth daily.   Yes [provider]  fluticasone  (FLONASE ) 50 MCG/ACT nasal spray Place 2 sprays into both nostrils daily as needed for allergies or rhinitis.   Yes [provider]  furosemide  (LASIX ) 80 MG tablet TAKE 1 TABLET TWICE A DAY. MAY TAKE AN ADDITIONAL 80MG  (1 TABLET) AS NEEDED FOR EDEMA 02/17/24  Yes Court Dorn PARAS, MD  hydrALAZINE  (APRESOLINE ) 25 MG tablet Take 1 tablet (25 mg total) by mouth 2 (two) times daily. 02/17/24  Yes Court Dorn PARAS, MD  isosorbide  mononitrate (IMDUR ) 60 MG 24 hr tablet Take 1 tablet (60 mg total) by mouth daily. 02/17/24  Yes Court Dorn PARAS, MD  Ketotifen Fumarate (ALAWAY OP) Place 1 drop into both eyes daily as needed (allergies).    Yes [provider]  lidocaine  (LIDODERM ) 5 % Place 1 patch onto the skin daily. Remove & Discard patch within 12 hours or as directed by MD 04/30/24  Yes Lesia Ozell Barter, PA-C  metoprolol  succinate (TOPROL -XL) 100 MG 24 hr tablet Take 2 tablets (200 mg total) by mouth daily. Take with or immediately following a meal. 02/17/24  Yes Court Dorn PARAS, MD  metoprolol  tartrate (LOPRESSOR ) 25 MG tablet Take  1 tablet (25 mg total) by mouth every 6 (six) hours as needed (palpitations). 10/21/23 10/20/24 Yes Leverne Charlies Helling, PA-C  Multiple Vitamin (MULTIVITAMIN WITH MINERALS) TABS Take 1 tablet by mouth daily.   Yes [provider]  nitroGLYCERIN  (NITROSTAT ) 0.4 MG SL tablet Place 1 tablet under the tongue every 5 minutes as needed for chest pain, max 3 doses, go to er if no relief 01/29/22  Yes Claudene Victory ORN,  MD  olmesartan  (BENICAR ) 40 MG tablet Take 1 tablet (40 mg total) by mouth daily. 10/10/23  Yes Court Dorn PARAS, MD  OZEMPIC, 0.25 OR 0.5 MG/DOSE, 2 MG/1.5ML SOPN Inject 0.25 mg into the skin once a week. Sunday 08/30/19  Yes [provider]  pantoprazole  (PROTONIX ) 40 MG tablet TAKE 1 TABLET BY MOUTH  DAILY 09/02/21  Yes Claudene Victory ORN, MD  potassium chloride  (KLOR-CON  M) 10 MEQ tablet TAKE 2 TABLETS TWICE A DAY (KEEP MD APPOINTMENT FOR REFILLS) Patient taking differently: Take 20 mEq by mouth daily. TAKE 2 TABLETS TWICE A DAY (KEEP MD APPOINTMENT FOR REFILLS) 06/06/23  Yes Court Dorn PARAS, MD  simvastatin  (ZOCOR ) 40 MG tablet Take 1 tablet (40 mg total) by mouth at bedtime. Patient taking differently: Take 40 mg by mouth daily. 10/10/23  Yes Court Dorn PARAS, MD  sodium chloride  (MURO 128) 5 % ophthalmic ointment Place 1 drop into both eyes at bedtime as needed for eye irritation. For dry eyes   Yes [provider]  traMADol  (ULTRAM ) 50 MG tablet Take 2 tablets (100 mg total) by mouth every 6 (six) hours as needed (MIGRAINES). 04/28/24  Yes Goodrich, Callie E, PA-C  TRUE METRIX BLOOD GLUCOSE TEST test strip  02/27/19  Yes [provider]  TRUEplus Lancets 33G MISC  02/27/19  Yes [provider]  venlafaxine  (EFFEXOR ) 100 MG tablet Take 100-200 mg by mouth See admin instructions. TAKE 200 mg by mouth in the morning and take 100 mg at noon 04/12/11  Yes [provider]  vitamin A 7500 UNIT capsule Take 2,400 Units by mouth daily.   Yes [provider]  LANTUS  SOLOSTAR 100 UNIT/ML Solostar Pen Inject 6 Units into the skin at bedtime. 04/10/19   [provider]    Allergies:    Allergies  Allergen Reactions   Calcium  Channel Blockers Other (See Comments)    Cannot take non-DHP CCBs (diltiazem, verapamil) due to CHF requiring ICD   Codeine Anaphylaxis and Other (See Comments)   Lyrica  [Pregabalin ]     Nightmares, and swelling   Other Anaphylaxis and Other (See Comments)    Walnuts, pecans, and ANY melons PATIENT IS A VEGETARIAN    Ramipril  Anaphylaxis, Swelling and Other (See Comments)   Ticlid [Ticlopidine Hcl] Other (See Comments)    Unprovoked bleeding while on Ticlid (doesn't think she was on ASA at the time) Takes Plavix  without problems   Morphine And Codeine Other (See Comments)    Severe AMS, confusion, weakness Tolerates Fentanyl , Tramadol    Tape Dermatitis and Rash    Tolerates paper tape   Cucumber Extract Other (See Comments)   Digoxin  Other (See Comments)   Digoxin  And Related Nausea Only    Unknown   Diltiazem Hcl Other (See Comments)   Metformin Hcl Other (See Comments)   Metolazone Other (See Comments)    Cannot remember reaction   Morphine Sulfate Other (See Comments)   Myrbetriq [Mirabegron] Other (See Comments)    Aggravates migraines   Nsaids Other (See Comments)   Onglyza [Saxagliptin] Other (See Comments)    Exacerbates migraines   Penicillins Hives    Has patient had a PCN reaction causing immediate rash, facial/tongue/throat swelling, SOB or lightheadedness with hypotension: Yes Has patient had a PCN reaction causing severe rash involving mucus membranes or skin necrosis: Unknown Has patient had a PCN reaction that required hospitalization: Unknown Has patient had a PCN reaction occurring within the last 10 years: No If all of  the above answers are NO, then may proceed with Cephalosporin use.    Spironolactone Other (See Comments)    Cannot remember reaction   Sulfa  Antibiotics Hives and Other (See Comments)   Tetracyclines & Related Other (See Comments)    Cannot remember reaction Tolerates macrolides (azithromycin)   Ticlopidine Other (See Comments)   Verapamil Other (See Comments)    Pacemaker/CHF   Vicodin [Hydrocodone-Acetaminophen ] Other (See Comments)    EXTREME LETHARGY   Latex Itching, Rash and Other (See Comments)   Social History:   Social History   Socioeconomic History   Marital status: Married    Spouse name: Ubaldo   Number of children: 1   Years of education: Not on file   Highest education level: Master's degree (e.g., MA, MS, MEng, MEd, MSW, MBA)  Occupational History    Employer: UNEMPLOYED    Comment: Disablity  Tobacco Use   Smoking status: Never   Smokeless tobacco: Never  Vaping Use   Vaping status: Never Used  Substance and Sexual Activity   Alcohol  use: No    Alcohol /week: 0.0 standard drinks of alcohol    Drug use: No   Sexual activity: Yes    Birth control/protection: Surgical  Other Topics Concern   Not on file  Social History Narrative   Caffeine Use: none   Social Drivers of Corporate investment banker Strain: Not on file  Food Insecurity: No Food Insecurity (04/27/2024)   Hunger Vital Sign    Worried About Running Out of Food in the Last Year: Never true    Ran Out of Food in the Last Year: Never true  Transportation Needs: No Transportation Needs (04/27/2024)   PRAPARE - Administrator, Civil Service (Medical): No    Lack of Transportation (Non-Medical): No  Physical Activity: Not on file  Stress: Not on file  Social Connections: Unknown (04/27/2024)   Social Connection and Isolation Panel    Frequency of Communication with Friends and Family: Three times a week    Frequency of Social Gatherings with Friends and Family: Three times a week    Attends Religious Services: Patient declined    Active Member of Clubs or Organizations: Patient declined    Attends Banker  Meetings: Patient declined    Marital Status: Patient declined  Intimate Partner Violence: Not At Risk (04/27/2024)   Humiliation, Afraid, Rape, and Kick questionnaire    Fear of Current or Ex-Partner: No    Emotionally Abused: No    Physically Abused: No    Sexually Abused: No    Family History:   The patient's family history includes Diabetes in her brother, father, and paternal grandmother; Heart attack in her brother, father, and mother; Heart disease in her brother, father, and mother; Hypertension in her father and mother; Kidney failure in her brother and father; Stroke in her father and mother.    ROS:   Review of Systems: [y] = yes, [ ]  = no      General: Weight gain [ ] ; Weight loss [ ] ; Anorexia [ ] ; Fatigue [ ] ; Fever [ ] ; Chills [ ] ; Weakness [ ]    Cardiac: Chest pain/pressure [ ] ; Resting SOB [ ] ; Exertional SOB [ ] ; Orthopnea [ ] ; Pedal Edema [ ] ; Palpitations [ ] ; Syncope [ ] ; Presyncope [ ] ; Paroxysmal nocturnal dyspnea [ ]    Pulmonary: Cough [ ] ; Wheezing [ ] ; Hemoptysis [ ] ; Sputum [ ] ; Snoring [ ]    GI: Vomiting [ ] ;  Dysphagia [ ] ; Melena [ ] ; Hematochezia [ ] ; Heartburn [ ] ; Abdominal pain [ ] ; Constipation [ ] ; Diarrhea [ ] ; BRBPR [ ]    GU: Hematuria [ ] ; Dysuria [ ] ; Nocturia [ ]  Vascular: Pain in legs with walking [ ] ; Pain in feet with lying flat [ ] ; Non-healing sores [ ] ; Stroke [ ] ; TIA [ ] ; Slurred speech [ ] ;   Neuro: Headaches [ ] ; Vertigo [ ] ; Seizures [ ] ; Paresthesias [ ] ;Blurred vision [ ] ; Diplopia [ ] ; Vision changes [ ]    Ortho/Skin: Arthritis [ ] ; Joint pain [ ] ; Muscle pain [ ] ; Joint swelling [ ] ; Back Pain [ ] ; Rash [ ]    Psych: Depression [ ] ; Anxiety [ ]    Heme: Bleeding problems [ ] ; Clotting disorders [ ] ; Anemia [ ]    Endocrine: Diabetes [ ] ; Thyroid  dysfunction [ ]    Physical Exam/Data:   Vitals:   05/02/24 0113 05/02/24 0200  BP: 110/68 127/76  Pulse: 69 71  Resp: 16 16  Temp: 98.7 F (37.1 C)   SpO2: 100% 100%   No intake or  output data in the 24 hours ending 05/02/24 0245    04/28/2024    3:26 AM 04/27/2024    4:51 AM 04/26/2024    2:14 PM  Last 3 Weights  Weight (lbs) 112 lb 7 oz 112 lb 10.5 oz 111 lb 15.9 oz  Weight (kg) 51 kg 51.1 kg 50.8 kg     There is no height or weight on file to calculate BMI.  General:  Well nourished, well developed, in no acute distress HEENT: normal Cardiac:  normal S1, S2; RRR; no murmur  Lungs:  clear to auscultation bilaterally, no wheezing, rhonchi or rales  Abd: soft, nontender, no hepatomegaly  Ext: no LE edema Musculoskeletal: No deformities, BUE and BLE strength normal and equal Skin: warm and dry, LEFT infraclavicular incision without drainage or erythema, moderate swelling Psych:  Normal affect   Relevant CV Studies:  CRTD generator replacement with pocket revision  Result date: 04/26/24 Successful removal of her previously implanted biventricular ICD which had reached elective replacement, and insertion of a new biventricular ICD in a patient with chronic systolic heart failure and left bundle branch block. Because the patient had lost 60 pounds and had no subcutaneous fat, and because of the concern for lead erosion through the skin, the new device and leads were placed in a subpectoral location.   Laboratory Data:  High Sensitivity Troponin:  No results for input(s): TROPONINIHS in the last 720 hours.    Chemistry Recent Labs  Lab 05/02/24 0120  NA 135  K 4.5  CL 99  CO2 25  GLUCOSE 109*  BUN 61*  CREATININE 3.75*  CALCIUM  8.7*  GFRNONAA 12*  ANIONGAP 11    No results for input(s): PROT, ALBUMIN , AST, ALT, ALKPHOS, BILITOT in the last 168 hours. Hematology Recent Labs  Lab 05/02/24 0120  WBC 6.6  RBC 3.09*  HGB 9.9*  HCT 28.9*  MCV 93.5  MCH 32.0  MCHC 34.3  RDW 14.3  PLT 127*   BNPNo results for input(s): BNP, PROBNP in the last 168 hours.  DDimer No results for input(s): DDIMER in the last 168  hours.  Radiology/Studies:  DG Chest Port 1 View Result Date: 05/02/2024 CLINICAL DATA:  Swelling defibrillator site EXAM: PORTABLE CHEST 1 VIEW COMPARISON:  10/19/2023 FINDINGS: Cardiac shadow is stable. Defibrillator is again noted. Postsurgical changes are seen. Lungs are clear. Mild soft tissue  swelling is noted in the region of the defibrillator pack. No bony abnormality is seen. IMPRESSION: Mild soft tissue swelling consistent with the given clinical history. Electronically Signed   By: Oneil Devonshire M.D.   On: 05/02/2024 02:05   Assessment and Plan:  [name] is a 73 y.o. female with HFmrEF/ICM (LVEF 40-45->45-50%), CAD s/p prior MI, AVNRT s/p slow pathway modification (11/04/23, Waddell) who had recent CRTD generator replacement (04/26/24) and presents with device site swelling.   LEFT MDT CRTD  Pocket hematoma s/p CRTD generator replacement/pocket revision (04/26/24) Device site does not appear infected. Steri strips removed, evaluated and replaced. New pressure dressing replaced. Would plan to leave for 72 hours. No indication for abx and with allergies and AKI on CKD would not empirically prescribe. Bcx pending, no signs/sx of systemic infection. Will need clinic f/u Friday to have pressure dressing taken down again.   HFrEF - continue OP lasix  80 mg bid - hold OP olmesartan  40 mg daily, replace with reduced dose irbesartan  150 mg daily while IP - continue OP imdur  60 mg daily  - continue OP toprol  XL 200 mg daily  -  ICM CAD GERD - continue OP protonix  40 mg daily  HLD - continue OP simvastatin  40 mg daily  - continue OP zetia  10 mg daily   HTN - continue OP hydral 25 mg bid  - start reduced dose irbesartan  as above - continue OP imudr and toprol  as above   Anxiety/MDD - continue OP Xanax  1 mg tid  - continue OP venlafaxine  200 mg qAM, 100mg  q/12:00   Severity of Illness: The appropriate patient status for this patient is OBSERVATION. Observation status is  judged to be reasonable and necessary in order to provide the required intensity of service to ensure the patient's safety. The patient's presenting symptoms, physical exam findings, and initial radiographic and laboratory data in the context of their medical condition is felt to place them at decreased risk for further clinical deterioration. Furthermore, it is anticipated that the patient will be medically stable for discharge from the hospital within 2 midnights of admission.    For questions or updates, please contact [facility] Please consult [email or web address] for contact info under   Signed, Donnice DELENA Primus, MD  05/02/2024 2:45 AM

## 2024-05-02 NOTE — ED Provider Notes (Signed)
 [place] EMERGENCY DEPARTMENT AT [facility] Provider Note   CSN: [id number] Arrival date & time: 05/02/24  9950     Patient presents with: Post Op swelling (ICD implant)   [name] is a 73 y.o. female.   The history is provided by the patient and medical records.  [name] is a 73 y.o. female who presents to the Emergency Department complaining of wound at postop site.  She is status post ICD generator change out on June 19.  She did have local swelling following the procedure.  On Friday she did have the overlying dressing removed.  She presents tonight due to noticing a blister near her wound site.  She has mild local soreness.  No fever, shortness of breath.  No reported injuries.  She is compliant with her medications.  She did call the on-call number and was instructed to present to the emergency department for further evaluation.     Prior to Admission medications   Medication Sig Start Date End Date Taking? Authorizing Provider  acetaminophen  (TYLENOL ) 500 MG tablet Take 1 tablet (500 mg total) by mouth every 6 (six) hours as needed for mild pain (pain score 1-3) or moderate pain (pain score 4-6). 04/28/24   Goodrich, Callie E, PA-C  albuterol  (PROVENTIL  HFA;VENTOLIN  HFA) 108 (90 BASE) MCG/ACT inhaler Inhale 2 puffs into the lungs every 4 (four) hours as needed for wheezing or shortness of breath.    [provider]  Alcohol  Swabs  (B-D SINGLE USE SWABS  REGULAR) PADS  03/05/19   [provider]  ALPRAZolam  (XANAX ) 1 MG tablet Take 1 mg by mouth 3 (three) times daily.  09/20/18   [provider]  Artificial Tear GEL Place 2 drops into both eyes daily as needed (dry eyes).     [provider]  aspirin  EC 81 MG tablet Take 4 tablets (325 mg total) by mouth daily. 11/05/23   Darryle Thom CROME, PA-C  BIOTIN PO Take 1 tablet by mouth daily.    [provider]  cetirizine (ZYRTEC) 10 MG tablet Take 10 mg by mouth  daily.    [provider]  Cholecalciferol  (VITAMIN D -3) 1000 UNITS CAPS Take 1,000 Units by mouth daily.     [provider]  clopidogrel  (PLAVIX ) 75 MG tablet Take 1 tablet (75 mg total) by mouth daily. 02/17/24   Court Dorn PARAS, MD  doxazosin  (CARDURA ) 4 MG tablet Take 4 mg by mouth at bedtime.     [provider]  DROPLET PEN NEEDLES 32G X 4 MM MISC  03/05/19   [provider]  EPINEPHrine  0.3 mg/0.3 mL IJ SOAJ injection Inject 0.3 mg into the muscle as needed for anaphylaxis.  08/08/18   [provider]  epoetin  alfa-epbx (RETACRIT ) 10000 UNIT/ML injection 10,000 Units every 21 ( twenty-one) days.    [provider]  ezetimibe  (ZETIA ) 10 MG tablet Take 1 tablet (10 mg total) by mouth daily. 02/17/24   Court Dorn PARAS, MD  Flaxseed, Linseed, (FLAXSEED OIL) 1200 MG CAPS Take 1,200 mg by mouth daily.    [provider]  fluticasone  (FLONASE ) 50 MCG/ACT nasal spray Place 2 sprays into both nostrils daily as needed for allergies or rhinitis.    [provider]  furosemide  (LASIX ) 80 MG tablet TAKE 1 TABLET TWICE A DAY. MAY TAKE AN ADDITIONAL 80MG  (1 TABLET) AS NEEDED FOR EDEMA 02/17/24   Court Dorn PARAS, MD  hydrALAZINE  (APRESOLINE ) 25 MG tablet Take 1 tablet (  25 mg total) by mouth 2 (two) times daily. 02/17/24   Court Dorn PARAS, MD  isosorbide  mononitrate (IMDUR ) 60 MG 24 hr tablet Take 1 tablet (60 mg total) by mouth daily. 02/17/24   Court Dorn PARAS, MD  Ketotifen Fumarate (ALAWAY OP) Place 1 drop into both eyes daily as needed (allergies).     [provider]  LANTUS  SOLOSTAR 100 UNIT/ML Solostar Pen Inject 6 Units into the skin at bedtime. 04/10/19   [provider]  lidocaine  (LIDODERM ) 5 % Place 1 patch onto the skin daily. Remove & Discard patch within 12 hours or as directed by MD 04/30/24   Lesia Ozell Barter, PA-C  metoprolol  succinate (TOPROL -XL) 100 MG 24 hr tablet Take 2 tablets (200 mg  total) by mouth daily. Take with or immediately following a meal. 02/17/24   Court Dorn PARAS, MD  metoprolol  tartrate (LOPRESSOR ) 25 MG tablet Take 1 tablet (25 mg total) by mouth every 6 (six) hours as needed (palpitations). 10/21/23 10/20/24  Ursuy, Renee Lynn, PA-C  Multiple Vitamin (MULTIVITAMIN WITH MINERALS) TABS Take 1 tablet by mouth daily.    [provider]  nitroGLYCERIN  (NITROSTAT ) 0.4 MG SL tablet Place 1 tablet under the tongue every 5 minutes as needed for chest pain, max 3 doses, go to er if no relief 01/29/22   Claudene Victory ORN, MD  olmesartan  (BENICAR ) 40 MG tablet Take 1 tablet (40 mg total) by mouth daily. 10/10/23   Court Dorn PARAS, MD  OZEMPIC, 0.25 OR 0.5 MG/DOSE, 2 MG/1.5ML SOPN Inject 0.25 mg into the skin once a week. Sunday 08/30/19   [provider]  pantoprazole  (PROTONIX ) 40 MG tablet TAKE 1 TABLET BY MOUTH  DAILY 09/02/21   Claudene Victory ORN, MD  potassium chloride  (KLOR-CON  M) 10 MEQ tablet TAKE 2 TABLETS TWICE A DAY (KEEP MD APPOINTMENT FOR REFILLS) Patient taking differently: Take 20 mEq by mouth daily. TAKE 2 TABLETS TWICE A DAY (KEEP MD APPOINTMENT FOR REFILLS) 06/06/23   Court Dorn PARAS, MD  simvastatin  (ZOCOR ) 40 MG tablet Take 1 tablet (40 mg total) by mouth at bedtime. Patient taking differently: Take 40 mg by mouth daily. 10/10/23   Court Dorn PARAS, MD  sodium chloride  (MURO 128) 5 % ophthalmic ointment Place 1 drop into both eyes at bedtime as needed for eye irritation. For dry eyes    [provider]  traMADol  (ULTRAM ) 50 MG tablet Take 2 tablets (100 mg total) by mouth every 6 (six) hours as needed (MIGRAINES). 04/28/24   Goodrich, Callie E, PA-C  TRUE METRIX BLOOD GLUCOSE TEST test strip  02/27/19   [provider]  TRUEplus Lancets 33G MISC  02/27/19   [provider]  venlafaxine  (EFFEXOR ) 100 MG tablet Take 100-200 mg by mouth See admin instructions. TAKE 200 mg by mouth in the morning and take 100 mg at noon  04/12/11   [provider]  vitamin A 7500 UNIT capsule Take 2,400 Units by mouth daily.    [provider]    Allergies: Calcium  channel blockers, Codeine, Lyrica  [pregabalin ], Other, Ramipril , Ticlid [ticlopidine hcl], Morphine and codeine, Tape, Cucumber extract, Digoxin , Digoxin  and related, Diltiazem hcl, Metformin hcl, Metolazone, Morphine sulfate, Myrbetriq [mirabegron], Nsaids, Onglyza [saxagliptin], Penicillins, Spironolactone, Sulfa antibiotics, Tetracyclines & related, Ticlopidine, Verapamil, Vicodin [hydrocodone-acetaminophen ], and Latex    Review of Systems  All other systems reviewed and are negative.   Updated Vital Signs BP 127/76   Pulse 71   Temp 98.7 F (37.1 C)  Resp 16   SpO2 100%   Physical Exam Vitals and nursing note reviewed.  Constitutional:      Appearance: She is well-developed.  HENT:     Head: Normocephalic and atraumatic.   Cardiovascular:     Rate and Rhythm: Normal rate and regular rhythm.  Pulmonary:     Effort: Pulmonary effort is normal. No respiratory distress.     Comments: There is soft tissue swelling and resolving ecchymosis over the left upper chest wall without significant tenderness to palpation.  Steri-Strips are in place.  There is a 1 cm vesicle to the lateral portion.  No significant surrounding erythema.  Musculoskeletal:        General: No tenderness.   Skin:    General: Skin is warm and dry.   Neurological:     Mental Status: She is alert and oriented to person, place, and time.   Psychiatric:        Behavior: Behavior normal.     (all labs ordered are listed, but only abnormal results are displayed) Labs Reviewed  CBC WITH DIFFERENTIAL/PLATELET - Abnormal; Notable for the following components:      Result Value   RBC 3.09 (*)    Hemoglobin 9.9 (*)    HCT 28.9 (*)    Platelets 127 (*)    All other components within normal limits  BASIC METABOLIC PANEL WITH GFR - Abnormal; Notable for the  following components:   Glucose, Bld 109 (*)    BUN 61 (*)    Creatinine, Ser 3.75 (*)    Calcium  8.7 (*)    GFR, Estimated 12 (*)    All other components within normal limits  CULTURE, BLOOD (ROUTINE X 2)  CULTURE, BLOOD (ROUTINE X 2)  C-REACTIVE PROTEIN    EKG: None  Radiology: [facility] Chest Port 1 View Result Date: 05/02/2024 CLINICAL DATA:  Swelling defibrillator site EXAM: PORTABLE CHEST 1 VIEW COMPARISON:  10/19/2023 FINDINGS: Cardiac shadow is stable. Defibrillator is again noted. Postsurgical changes are seen. Lungs are clear. Mild soft tissue swelling is noted in the region of the defibrillator pack. No bony abnormality is seen. IMPRESSION: Mild soft tissue swelling consistent with the given clinical history. Electronically Signed   By: Oneil Devonshire M.D.   On: 05/02/2024 02:05     Procedures   Medications Ordered in the ED - No data to display                                  Medical Decision Making Amount and/or Complexity of Data Reviewed Radiology: ordered.   Patient here for eval changes following recent ICD generator exchange.  She does have a small vesicle to the lateral portion of the surgical site.  She does have a local hematoma that appears to be resolving, no overlying erythema.  She does have CKD, renal function is mildly increased compared to recent.  Hemoglobin mildly downtrending.  She has been seen by cardiology in the emergency department with plan to admit for observation.  Patient is in agreement with treatment plan.     Final diagnoses:  Encounter for postoperative wound check    ED Discharge Orders     None          Griselda Norris, MD 05/02/24 [phone]

## 2024-05-03 ENCOUNTER — Other Ambulatory Visit: Payer: Self-pay | Admitting: Cardiovascular Disease

## 2024-05-03 ENCOUNTER — Other Ambulatory Visit (HOSPITAL_COMMUNITY): Payer: Self-pay

## 2024-05-03 LAB — CBC
HCT: 29.3 % — ABNORMAL LOW (ref 36.0–46.0)
Hemoglobin: 10.1 g/dL — ABNORMAL LOW (ref 12.0–15.0)
MCH: 31.8 pg (ref 26.0–34.0)
MCHC: 34.5 g/dL (ref 30.0–36.0)
MCV: 92.1 fL (ref 80.0–100.0)
Platelets: 153 10*3/uL (ref 150–400)
RBC: 3.18 MIL/uL — ABNORMAL LOW (ref 3.87–5.11)
RDW: 14.2 % (ref 11.5–15.5)
WBC: 7.5 10*3/uL (ref 4.0–10.5)
nRBC: 0 % (ref 0.0–0.2)

## 2024-05-03 LAB — RENAL FUNCTION PANEL
Albumin: 3.5 g/dL (ref 3.5–5.0)
Anion gap: 7 (ref 5–15)
BUN: 54 mg/dL — ABNORMAL HIGH (ref 8–23)
CO2: 30 mmol/L (ref 22–32)
Calcium: 8.7 mg/dL — ABNORMAL LOW (ref 8.9–10.3)
Chloride: 100 mmol/L (ref 98–111)
Creatinine, Ser: 3.02 mg/dL — ABNORMAL HIGH (ref 0.44–1.00)
GFR, Estimated: 16 mL/min — ABNORMAL LOW (ref >60)
Glucose, Bld: 122 mg/dL — ABNORMAL HIGH (ref 70–99)
Phosphorus: 4.4 mg/dL (ref 2.5–4.6)
Potassium: 4 mmol/L (ref 3.5–5.1)
Sodium: 137 mmol/L (ref 135–145)

## 2024-05-03 NOTE — Plan of Care (Signed)
 Pt is discharge to home. PIV removed. Personal belongings returned. DC instruction provided.   Problem: Education: Goal: Understanding of cardiac disease, CV risk reduction, and recovery process will improve Outcome: Completed/Met Goal: Individualized Educational Video(s) Outcome: Completed/Met   Problem: Activity: Goal: Ability to tolerate increased activity will improve Outcome: Completed/Met   Problem: Cardiac: Goal: Ability to achieve and maintain adequate cardiovascular perfusion will improve Outcome: Completed/Met   Problem: Health Behavior/Discharge Planning: Goal: Ability to safely manage health-related needs after discharge will improve Outcome: Completed/Met   Problem: Education: Goal: Knowledge of General Education information will improve Description: Including pain rating scale, medication(s)/side effects and non-pharmacologic comfort measures Outcome: Completed/Met   Problem: Health Behavior/Discharge Planning: Goal: Ability to manage health-related needs will improve Outcome: Completed/Met   Problem: Clinical Measurements: Goal: Ability to maintain clinical measurements within normal limits will improve Outcome: Completed/Met Goal: Will remain free from infection Outcome: Completed/Met Goal: Diagnostic test results will improve Outcome: Completed/Met Goal: Respiratory complications will improve Outcome: Completed/Met Goal: Cardiovascular complication will be avoided Outcome: Completed/Met   Problem: Activity: Goal: Risk for activity intolerance will decrease Outcome: Completed/Met   Problem: Nutrition: Goal: Adequate nutrition will be maintained Outcome: Completed/Met   Problem: Coping: Goal: Level of anxiety will decrease Outcome: Completed/Met   Problem: Elimination: Goal: Will not experience complications related to bowel motility Outcome: Completed/Met Goal: Will not experience complications related to urinary retention Outcome:  Completed/Met   Problem: Pain Managment: Goal: General experience of comfort will improve and/or be controlled Outcome: Completed/Met   Problem: Safety: Goal: Ability to remain free from injury will improve Outcome: Completed/Met   Problem: Skin Integrity: Goal: Risk for impaired skin integrity will decrease Outcome: Completed/Met

## 2024-05-03 NOTE — Progress Notes (Signed)
 Renal function improved.  Continue to hold ARB for now and f/u with Dr. Tobie in the office in 2-3 weeks.  She is to be discharged to home today per EP.

## 2024-05-03 NOTE — TOC CM/SW Note (Signed)
 Transition of Care [facility]) - Inpatient Brief Assessment   Patient Details  Name: [name] MRN: [id number] Date of Birth: August 15, 1951  Transition of Care [facility]) CM/SW Contact:    Sudie Erminio Deems, RN Phone Number: 05/03/2024, 12:02 PM   Clinical Narrative: Patient presented for ICD site swelling. PTA patient was from home alone and does not use any DME. Patient has insurance and she gets her medications from [employer] in [place] for local medication needs. No home needs identified at this time. Case Manager will continue to follow for additional needs.     Transition of Care Asessment: Insurance and Status: Insurance coverage has been reviewed Patient has primary care physician: Yes Home environment has been reviewed: reviewed-from home alone Prior level of function:: independent Prior/Current Home Services: No current home services Social Drivers of Health Review: SDOH reviewed no interventions necessary Readmission risk has been reviewed: Yes Transition of care needs: no transition of care needs at this time

## 2024-05-07 LAB — CULTURE, BLOOD (ROUTINE X 2)
Culture: NO GROWTH
Special Requests: ADEQUATE

## 2024-05-09 ENCOUNTER — Ambulatory Visit: Attending: Cardiology

## 2024-05-09 DIAGNOSIS — I255 Ischemic cardiomyopathy: Secondary | ICD-10-CM

## 2024-05-09 DIAGNOSIS — Z95810 Presence of automatic (implantable) cardiac defibrillator: Secondary | ICD-10-CM

## 2024-05-09 NOTE — Patient Instructions (Addendum)
 After Your ICD (Implantable Cardiac Defibrillator)  You may resume your ASPIRIN .  Do NOT take your PLAVIX  until after your next wound check.  Follow up appointment for wound recheck 05/15/2024 at 2:40 pm.     Monitor your defibrillator site for redness, swelling, and drainage. Call the device clinic at [phone] if you experience these symptoms or fever/chills.  Your incision was closed with Steri-strips or staples:  You may shower 7 days after your procedure and wash your incision with soap and water. Avoid lotions, ointments, or perfumes over your incision until it is well-healed.  You may use a hot tub or a pool after your wound check appointment if the incision is completely closed.  There are no restrictions in arm movement after your wound check appointment.  Your ICD is designed to protect you from life threatening heart rhythms. Because of this, you may receive a shock.   1 shock with no symptoms:  Call the office during business hours. 1 shock with symptoms (chest pain, chest pressure, dizziness, lightheadedness, shortness of breath, overall feeling unwell):  Call 911. If you experience 2 or more shocks in 24 hours:  Call 911. If you receive a shock, you should not drive.  [place] DMV - no driving for 6 months if you receive appropriate therapy from your ICD.   ICD Alerts:  Some alerts are vibratory and others beep. These are NOT emergencies. Please call our office to let us  know. If this occurs at night or on weekends, it can wait until the next business day. Send a remote transmission.  If your device is capable of reading fluid status (for heart failure), you will be offered monthly monitoring to review this with you.   Remote monitoring is used to monitor your ICD from home. This monitoring is scheduled every 91 days by our office. It allows us  to keep an eye on the functioning of your device to ensure it is working properly. You will routinely see your Electrophysiologist  annually (more often if necessary).

## 2024-05-09 NOTE — Progress Notes (Addendum)
 Normal multi chamber ICD wound check. Pressure dressing removed.  Small blister noted from recent hospitalization healing well.  No s/s of infection.  Swelling continues.  No new bleeding noted.  Incision site is approximated.  Evaluated area with Daphne Barrack NP.   Discussed with Dr. Waddell.  Will keep site open to air at this time.  Pt may shower and cleanse area with soap and water.  Will have Pt resume ASA but continue to hold PLAVIX  until wound recheck in 6 days.  Pt aware to contact device clinic if any changes to wound site prior to follow up.   Follow up appointment scheduled.  Presenting rhythm: AS/VP 67 . Routine testing performed. Thresholds, sensing, and impedances consistent with pre generator change measurements. No treated arrhythmias.  Pt enrolled in remote follow-up.

## 2024-05-10 ENCOUNTER — Ambulatory Visit: Admitting: Radiology

## 2024-05-10 ENCOUNTER — Ambulatory Visit
Admission: EM | Admit: 2024-05-10 | Discharge: 2024-05-10 | Disposition: A | Discharge status: Home / Self Care | Source: Ambulatory Visit | Attending: Physician Assistant | Admitting: Physician Assistant

## 2024-05-10 ENCOUNTER — Ambulatory Visit (HOSPITAL_COMMUNITY)
Admission: RE | Admit: 2024-05-10 | Discharge: 2024-05-10 | Disposition: A | Discharge status: Home / Self Care | Source: Ambulatory Visit | Attending: Nephrology | Admitting: Nephrology

## 2024-05-10 VITALS — BP 137/75 | HR 79 | Temp 97.1°F | Resp 18

## 2024-05-10 DIAGNOSIS — S92811A Other fracture of right foot, initial encounter for closed fracture: Secondary | ICD-10-CM | POA: Diagnosis not present

## 2024-05-10 DIAGNOSIS — S92501A Displaced unspecified fracture of right lesser toe(s), initial encounter for closed fracture: Secondary | ICD-10-CM | POA: Diagnosis not present

## 2024-05-10 DIAGNOSIS — N183 Chronic kidney disease, stage 3 unspecified: Secondary | ICD-10-CM | POA: Diagnosis not present

## 2024-05-10 DIAGNOSIS — S92351A Displaced fracture of fifth metatarsal bone, right foot, initial encounter for closed fracture: Secondary | ICD-10-CM

## 2024-05-10 DIAGNOSIS — S92001A Unspecified fracture of right calcaneus, initial encounter for closed fracture: Secondary | ICD-10-CM | POA: Diagnosis not present

## 2024-05-10 DIAGNOSIS — D631 Anemia in chronic kidney disease: Secondary | ICD-10-CM | POA: Insufficient documentation

## 2024-05-10 DIAGNOSIS — S92354A Nondisplaced fracture of fifth metatarsal bone, right foot, initial encounter for closed fracture: Secondary | ICD-10-CM | POA: Diagnosis not present

## 2024-05-10 DIAGNOSIS — S92251A Displaced fracture of navicular [scaphoid] of right foot, initial encounter for closed fracture: Secondary | ICD-10-CM | POA: Diagnosis not present

## 2024-05-10 LAB — CBC
HCT: 29.1 % — ABNORMAL LOW (ref 36.0–46.0)
Hemoglobin: 9.8 g/dL — ABNORMAL LOW (ref 12.0–15.0)
MCH: 31.7 pg (ref 26.0–34.0)
MCHC: 33.7 g/dL (ref 30.0–36.0)
MCV: 94.2 fL (ref 80.0–100.0)
Platelets: 249 10*3/uL (ref 150–400)
RBC: 3.09 MIL/uL — ABNORMAL LOW (ref 3.87–5.11)
RDW: 14 % (ref 11.5–15.5)
WBC: 7 10*3/uL (ref 4.0–10.5)
nRBC: 0 % (ref 0.0–0.2)

## 2024-05-10 LAB — POCT HEMOGLOBIN-HEMACUE: Hemoglobin: 9.3 g/dL — ABNORMAL LOW (ref 12.0–15.0)

## 2024-05-10 LAB — IRON AND TIBC
Iron: 78 ug/dL (ref 28–170)
Saturation Ratios: 23 % (ref 10.4–31.8)
TIBC: 346 ug/dL (ref 250–450)
UIBC: 268 ug/dL

## 2024-05-10 LAB — FERRITIN: Ferritin: 275 ng/mL (ref 11–307)

## 2024-05-10 MED ORDER — EPOETIN ALFA-EPBX 10000 UNIT/ML IJ SOLN
10000.0000 [IU] | Freq: Once | INTRAMUSCULAR | Status: AC
  Administered 2024-05-10: 10000 [IU] via SUBCUTANEOUS

## 2024-05-10 MED ORDER — EPOETIN ALFA-EPBX 10000 UNIT/ML IJ SOLN
INTRAMUSCULAR | Status: AC
Start: 2024-05-10 — End: 2024-05-10
  Filled 2024-05-10: qty 1

## 2024-05-10 MED ORDER — ACETAMINOPHEN 325 MG PO TABS
650.0000 mg | ORAL_TABLET | Freq: Once | ORAL | Status: AC
  Administered 2024-05-10: 650 mg via ORAL

## 2024-05-10 NOTE — ED Provider Notes (Signed)
 [facility]    CSN: [id number] Arrival date & time: 05/10/24  1618      History   Chief Complaint Chief Complaint  Patient presents with   Ankle Pain    HPI [name] is a 73 y.o. female.   HPI Pt reports she was at [facility] for an infusion and fell down the stairs  She states she missed a few steps and fell down but did not hit her head  She is not sure how she injured her right foot during the fall but reports significant pain She reports she already had baseline swelling in her feet and ankles as her nephrologist has taken her off her fluid pills She reports pain started almost instantly in her foot and is approaching 10/10  Interventions: none so far. Pt has ice pack applied to the foot in exam     Past Medical History:  Diagnosis Date   AICD (automatic cardioverter/defibrillator) present    [employer]- Dr. JUDITHANN Birmingham follows   Anginal pain (HCC)    Anxiety    Asthma    Back pain    pinched nerve-lower back - Dr. Bonner follows.   Biventricular implantable cardioverter-defibrillator in situ 06/18/2011   Cerebral infarction (HCC) 11/11/2012   Cervical dysplasia    Chronic diastolic heart failure (HCC) 03/22/2016   Chronic kidney disease    Dr. Tobie follows.   Chronic renal insufficiency, stage III (moderate) (HCC) 11/11/2012   Complication of anesthesia    I wake up during surgeries (02/14/2013)   Coronary artery disease involving coronary bypass graft of native heart with angina pectoris (HCC) 06/18/2011   Depression    DM (diabetes mellitus) (HCC) 11/11/2012   Fibroid    Function kidney decreased    GERD (gastroesophageal reflux disease)    Hemiplegia, unspecified, affecting nondominant side 11/11/2012   Hepatitis    Hepatitis A -college yrswater source exposure   Hiatal hernia    History of shingles    2-3 yrs ago last out break around waist   History of stomach ulcers    Hyperlipidemia 07/30/2016   Hypertension    ICD (implantable  cardiac defibrillator) in place    Iron  deficiency anemia    Ischemic cardiomyopathy    status post biventricular ICD placed by DR Edumunds who used to see Dr Lavon here to establish  cardiovascular care.   Migraines    MVP (mitral valve prolapse)    Antibiotics not required for procedures   Myocardial infarction (HCC)    I've had 2; the others they were able to catch before completing (02/14/2013)   Pacemaker    Paroxysmal SVT (supraventricular tachycardia) (HCC) 06/18/2011   Pneumonia 1950's & 1985   Shortness of breath    lying down flat; at times w/exertion (02/14/2013). 10-06-15 exertion only..   Stroke (HCC)    2 confirmed; 9 TIA's; results in dragging LLE; numbness in tip of tongue (02/14/2013),10-06-15 right hand tends to be weaker when tired.   TIA (transient ischemic attack) 11/11/2012   Type II diabetes mellitus [facility])     Patient Active Problem List   Diagnosis Date Noted   Complications due to automatic implantable cardioverter-defibrillator 05/02/2024   Pacemaker at end of battery life 04/27/2024   ICD (implantable cardioverter-defibrillator) in place 04/26/2024   CKD (chronic kidney disease) stage 4, GFR 15-29 ml/min (HCC) 11/05/2023   SVT (supraventricular tachycardia) (HCC) 11/04/2023   Palpitation 10/19/2023   ESRD (end stage renal disease) (HCC) 10/19/2023  Chest pain 08/02/2018   Angina of effort (HCC)    Hyperlipidemia 07/30/2016   Chronic diastolic heart failure (HCC) 03/22/2016   Ischemic cardiomyopathy 01/28/2015   Mastodynia, female 05/14/2014   Cerebral infarction (HCC) 11/11/2012   HTN (hypertension) 11/11/2012   DM (diabetes mellitus) (HCC) 11/11/2012   Chronic renal insufficiency, stage III (moderate) (HCC) 11/11/2012   TIA (transient ischemic attack) 11/11/2012   History of shingles    MVP (mitral valve prolapse)    Asthma    Anxiety    Depression    Hiatal hernia    Cervical dysplasia    Fibroid    Biventricular implantable  cardioverter-defibrillator in situ 06/18/2011   Paroxysmal SVT (supraventricular tachycardia) (HCC) 06/18/2011   Coronary artery disease involving coronary bypass graft of native heart with angina pectoris (HCC) 06/18/2011    Past Surgical History:  Procedure Laterality Date   ABDOMINAL HYSTERECTOMY  1985   TAH    BIV ICD GENERATOR CHANGEOUT N/A 04/26/2024   Procedure: BIV ICD GENERATOR CHANGEOUT;  Surgeon: Waddell Danelle ORN, MD;  Location: [facility] INVASIVE CV LAB;  Service: Cardiovascular;  Laterality: N/A;   BIV ICD GENERTAOR CHANGE OUT  06/2007; 04/2008   2 lead initial placement, at [facility]; done at [facility], after developing CHF (02/14/2013)   BIV PACEMAKER GENERATOR CHANGE OUT N/A 01/29/2015   Procedure: BIV PACEMAKER GENERATOR CHANGE OUT;  Surgeon: Danelle ORN Waddell, MD;  Location: [facility] CATH LAB;  Service: Cardiovascular;  Laterality: N/A;   BREAST EXCISIONAL BIOPSY Left 01/2007; 06/2007; 03/2008   benign (02/14/2013)   BREAST SURGERY     CARDIAC CATHETERIZATION     probably in the teens (02/14/2013)   CARDIAC DEFIBRILLATOR PLACEMENT     COLONOSCOPY  ~ 2002   COLONOSCOPY WITH PROPOFOL  N/A 10/07/2015   Procedure: COLONOSCOPY WITH PROPOFOL ;  Surgeon: Renaye Sous, MD;  Location: [facility] ENDOSCOPY;  Service: Endoscopy;  Laterality: N/A;   CORONARY ANGIOPLASTY WITH STENT PLACEMENT     started out w/5; bypass corrected some; 1 stent since the bypass (02/14/2013)   CORONARY ARTERY BYPASS GRAFT  ` 1998   LIMA-LAD, SVG-D1, SVG-PDA   DILATION AND CURETTAGE OF UTERUS  1975 X 2; 1976; 1977   INSERT / REPLACE / REMOVE PACEMAKER     biventricular defibrillator--06/10/ 2009   LEFT HEART CATH AND CORS/GRAFTS ANGIOGRAPHY N/A 08/04/2018   Procedure: LEFT HEART CATH AND CORS/GRAFTS ANGIOGRAPHY;  Surgeon: Verlin Lonni BIRCH, MD;  Location: [facility] INVASIVE CV LAB;  Service: Cardiovascular;  Laterality: N/A;   LEFT HEART CATHETERIZATION WITH CORONARY/GRAFT ANGIOGRAM N/A 01/03/2015   Procedure: LEFT HEART CATHETERIZATION WITH  EL BILE;  Surgeon: Victory ORN Claudene DOUGLAS, MD;  Location: [facility] CATH LAB;  Service: Cardiovascular;  Laterality: N/A;   SUPRAVENTRICULAR TACHYCARDIA ABLATION  06/2007   SVT ABLATION N/A 11/04/2023   Procedure: SVT ABLATION;  Surgeon: Waddell Danelle ORN, MD;  Location: [facility] INVASIVE CV LAB;  Service: Cardiovascular;  Laterality: N/A;   TEE WITHOUT CARDIOVERSION  11/14/2012   Procedure: TRANSESOPHAGEAL ECHOCARDIOGRAM (TEE);  Surgeon: Oneil Parchment, MD;  Location: [facility] ENDOSCOPY;  Service: Cardiovascular;  Laterality: N/A;    OB History     Gravida  7   Para  2   Term      Preterm  2   AB  5   Living  1      SAB      IAB      Ectopic      Multiple      Live Births  Home Medications    Prior to Admission medications   Medication Sig Start Date End Date Taking? Authorizing Provider  acetaminophen  (TYLENOL ) 500 MG tablet Take 1 tablet (500 mg total) by mouth every 6 (six) hours as needed for mild pain (pain score 1-3) or moderate pain (pain score 4-6). 04/28/24   Goodrich, Callie E, PA-C  albuterol  (PROVENTIL  HFA;VENTOLIN  HFA) 108 (90 BASE) MCG/ACT inhaler Inhale 2 puffs into the lungs every 4 (four) hours as needed for wheezing or shortness of breath.    [provider]  Alcohol  Swabs  (B-D SINGLE USE SWABS  REGULAR) PADS  03/05/19   [provider]  ALPRAZolam  (XANAX ) 1 MG tablet Take 1 mg by mouth 3 (three) times daily.  09/20/18   [provider]  Artificial Tear GEL Place 2 drops into both eyes daily as needed (dry eyes).     [provider]  aspirin  EC 81 MG tablet Take 4 tablets (325 mg total) by mouth daily. 11/05/23   Darryle Thom CROME, PA-C  BIOTIN PO Take 1 tablet by mouth daily.    [provider]  cetirizine (ZYRTEC) 10 MG tablet Take 10 mg by mouth daily.    [provider]  Cholecalciferol  (VITAMIN D -3) 1000 UNITS CAPS Take 1,000 Units by mouth daily.     [provider]  clopidogrel   (PLAVIX ) 75 MG tablet TAKE 1 TABLET EVERY DAY (NEED MD APPOINTMENT) 05/07/24   Court Dorn PARAS, MD  doxazosin  (CARDURA ) 4 MG tablet Take 4 mg by mouth at bedtime.     [provider]  DROPLET PEN NEEDLES 32G X 4 MM MISC  03/05/19   [provider]  EPINEPHrine  0.3 mg/0.3 mL IJ SOAJ injection Inject 0.3 mg into the muscle as needed for anaphylaxis.  08/08/18   [provider]  epoetin  alfa-epbx (RETACRIT ) 10000 UNIT/ML injection 10,000 Units every 21 ( twenty-one) days.    [provider]  ezetimibe  (ZETIA ) 10 MG tablet TAKE 1 TABLET EVERY DAY (NEED MD APPOINTMENT) 05/07/24   Court Dorn PARAS, MD  Flaxseed, Linseed, (FLAXSEED OIL) 1200 MG CAPS Take 1,200 mg by mouth daily.    [provider]  fluticasone  (FLONASE ) 50 MCG/ACT nasal spray Place 2 sprays into both nostrils daily as needed for allergies or rhinitis.    [provider]  furosemide  (LASIX ) 80 MG tablet TAKE 1 TABLET TWICE A DAY. MAY TAKE AN ADDITIONAL 80MG  (1 TABLET) AS NEEDED FOR EDEMA 02/17/24   Court Dorn PARAS, MD  hydrALAZINE  (APRESOLINE ) 25 MG tablet TAKE 1 TABLET TWICE DAILY (NEED MD APPOINTMENT) 05/07/24   Court Dorn PARAS, MD  isosorbide  mononitrate (IMDUR ) 60 MG 24 hr tablet TAKE 1 TABLET EVERY DAY (NEED MD APPOINTMENT) 05/07/24   Court Dorn PARAS, MD  Ketotifen Fumarate (ALAWAY OP) Place 1 drop into both eyes daily as needed (allergies).     [provider]  LANTUS  SOLOSTAR 100 UNIT/ML Solostar Pen Inject 6 Units into the skin at bedtime. Patient not taking: Reported on 05/02/2024 04/10/19   [provider]  lidocaine  (LIDODERM ) 5 % Place 1 patch onto the skin daily. Remove & Discard patch within 12 hours or as directed by MD 04/30/24   Lesia Ozell Barter, PA-C  metoprolol  succinate (TOPROL -XL) 100 MG 24 hr tablet TAKE 2 TABLETS EVERY DAY. TAKE WITH OR IMMEDIATELY FOLLOWING A MEAL (NEED MD APPOINTMENT) 05/07/24   Court Dorn PARAS, MD  metoprolol  tartrate  (LOPRESSOR ) 25 MG tablet Take 1 tablet (25 mg total) by mouth  every 6 (six) hours as needed (palpitations). Patient not taking: Reported on 05/02/2024 10/21/23 10/20/24  Ursuy, Renee Lynn, PA-C  Multiple Vitamin (MULTIVITAMIN WITH MINERALS) TABS Take 1 tablet by mouth daily.    [provider]  nitroGLYCERIN  (NITROSTAT ) 0.4 MG SL tablet Place 1 tablet under the tongue every 5 minutes as needed for chest pain, max 3 doses, go to er if no relief 01/29/22   Claudene Victory ORN, MD  olmesartan  (BENICAR ) 40 MG tablet Take 1 tablet (40 mg total) by mouth daily. 10/10/23   Court Dorn PARAS, MD  OZEMPIC, 0.25 OR 0.5 MG/DOSE, 2 MG/1.5ML SOPN Inject 0.25 mg into the skin once a week. Sunday 08/30/19   [provider]  pantoprazole  (PROTONIX ) 40 MG tablet TAKE 1 TABLET BY MOUTH  DAILY 09/02/21   Claudene Victory ORN, MD  potassium chloride  (KLOR-CON  M) 10 MEQ tablet TAKE 2 TABLETS TWICE A DAY (KEEP MD APPOINTMENT FOR REFILLS) Patient taking differently: Take 20 mEq by mouth daily. TAKE 2 TABLETS TWICE A DAY (KEEP MD APPOINTMENT FOR REFILLS) 06/06/23   Court Dorn PARAS, MD  simvastatin  (ZOCOR ) 40 MG tablet Take 1 tablet (40 mg total) by mouth at bedtime. Patient taking differently: Take 40 mg by mouth daily. 10/10/23   Court Dorn PARAS, MD  sodium chloride  (MURO 128) 5 % ophthalmic ointment Place 1 drop into both eyes at bedtime as needed for eye irritation. For dry eyes    [provider]  traMADol  (ULTRAM ) 50 MG tablet Take 2 tablets (100 mg total) by mouth every 6 (six) hours as needed (MIGRAINES). Patient taking differently: Take 100 mg by mouth every 6 (six) hours as needed for moderate pain (pain score 4-6). 04/28/24   Goodrich, Callie E, PA-C  TRUE METRIX BLOOD GLUCOSE TEST test strip  02/27/19   [provider]  TRUEplus Lancets 33G MISC  02/27/19   [provider]  venlafaxine  (EFFEXOR ) 100 MG tablet Take 100-200 mg by mouth See admin instructions. TAKE 200 mg by mouth in  the morning and take 100 mg at noon 04/12/11   [provider]  VITAMIN A PO Take 2,400 Units by mouth daily.    [provider]    Family History Family History  Problem Relation Age of Onset   Heart disease Mother    Hypertension Mother    Heart attack Mother    Stroke Mother    Heart disease Father    Hypertension Father    Diabetes Father    Kidney failure Father    Heart attack Father    Stroke Father    Heart disease Brother    Diabetes Brother    Kidney failure Brother    Heart attack Brother    Diabetes Paternal Grandmother     Social History Social History   Tobacco Use   Smoking status: Never   Smokeless tobacco: Never  Vaping Use   Vaping status: Never Used  Substance Use Topics   Alcohol  use: No    Alcohol /week: 0.0 standard drinks of alcohol    Drug use: No     Allergies   Calcium  channel blockers, Codeine, Lyrica  [pregabalin ], Other, Ramipril , Ticlid [ticlopidine hcl], Morphine and codeine, Tape, Cucumber extract, Digoxin , Digoxin  and related, Diltiazem hcl, Metformin hcl, Metolazone, Morphine sulfate, Myrbetriq [mirabegron], Nsaids, Onglyza [saxagliptin], Penicillins, Spironolactone, Sulfa antibiotics, Tetracyclines & related, Ticlopidine, Verapamil, Vicodin [hydrocodone-acetaminophen ], and Latex   Review of Systems Review of Systems  Musculoskeletal:  Positive for joint swelling and myalgias.  Physical Exam Triage Vital Signs ED Triage Vitals  Encounter Vitals Group     BP 05/10/24 1721 120/70     Girls Systolic BP Percentile --      Girls Diastolic BP Percentile --      Boys Systolic BP Percentile --      Boys Diastolic BP Percentile --      Pulse Rate 05/10/24 1721 69     Resp 05/10/24 1721 16     Temp 05/10/24 1721 98.7 F (37.1 C)     Temp Source 05/10/24 1721 Oral     SpO2 05/10/24 1721 98 %     Weight --      Height --      Head Circumference --      Peak Flow --      Pain Score 05/10/24 1719 8     Pain Loc  --      Pain Education --      Exclude from Growth Chart --    No data found.  Updated Vital Signs BP 120/70 (BP Location: Right Arm)   Pulse 69   Temp 98.7 F (37.1 C) (Oral)   Resp 16   SpO2 98%   Visual Acuity Right Eye Distance:   Left Eye Distance:   Bilateral Distance:    Right Eye Near:   Left Eye Near:    Bilateral Near:     Physical Exam Vitals reviewed.  Constitutional:      General: She is awake.     Appearance: Normal appearance. She is well-developed and well-groomed.  HENT:     Head: Normocephalic and atraumatic.  Eyes:     General: Lids are normal. Gaze aligned appropriately.     Extraocular Movements: Extraocular movements intact.     Conjunctiva/sclera: Conjunctivae normal.  Cardiovascular:     Pulses:          Dorsalis pedis pulses are 2+ on the right side.       Posterior tibial pulses are 2+ on the right side.  Pulmonary:     Effort: Pulmonary effort is normal.  Musculoskeletal:       Feet:  Feet:     Right foot:     Skin integrity: Erythema and warmth present.     Comments: Left foot physical exam findings: Patient has swelling and tenderness along the dorsum of the forefoot particularly along the fifth metatarsal.  She is able to flex and extend her toes but there is some pain associated with this.  She has cap refill less than 2 seconds distal aspects of multiple toes and dorsalis pedis and posterior tibialis pulses are 2+ and brisk.  No obvious signs of anterior drawer instability with regards to the left ankle and she is able to dorsiflex, plantarflex, supinate and pronate. Neurological:     Mental Status: She is alert and oriented to person, place, and time.  Psychiatric:        Attention and Perception: Attention and perception normal.        Mood and Affect: Mood and affect normal.        Speech: Speech normal.        Behavior: Behavior normal. Behavior is cooperative.      UC Treatments / Results  Labs (all labs ordered are  listed, but only abnormal results are displayed) Labs Reviewed - No data to display  EKG   Radiology DG Foot Complete Right Result Date: 05/10/2024 CLINICAL DATA:  foot injury EXAM: RIGHT FOOT  COMPLETE - 3+ VIEW COMPARISON:  X-ray right ankle 05/10/2024 FINDINGS: Acute transverse fracture of the fifth digit metatarsal 1 cm distal from the base. Cortical irregularity of the calcaneus anterior laterally distant with an underlying acute comminuted and displaced fracture. Acute comminuted and displaced fracture of the superior aspect of the navicular. There is no evidence of arthropathy or other focal bone abnormality. Soft tissues are unremarkable. Vascular calcifications. IMPRESSION: 1. Acute transverse fracture of the fifth digit metatarsal. 2. Acute comminuted and displaced calcaneal fracture. 3. Acute comminuted and displaced fracture of the superior aspect of the navicular. Electronically Signed   By: Morgane  Naveau M.D.   On: 05/10/2024 18:34   DG Ankle Complete Right Result Date: 05/10/2024 CLINICAL DATA:  right ankle injury, swelling EXAM: RIGHT ANKLE - COMPLETE 3+ VIEW COMPARISON:  None Available. FINDINGS: Cortical irregularity and small chip fragment along the superior aspect of the navicular bone. Subtle lucency through the base of the fifth metatarsal. No ankle mortise widening. The talar dome is intact. There is no evidence of arthropathy or other focal bone abnormality. Moderate soft tissue swelling about the lateral malleolus. Peripheral vascular atherosclerosis. IMPRESSION: 1. Moderate soft tissue swelling about the lateral malleolus. Cortical irregularity and small chip fragment superior aspect of the navicular bone, possibly reflecting an avulsion fracture. 2. Nondisplaced fracture through the proximal base of the fifth metatarsal Electronically Signed   By: Rogelia Myers M.D.   On: 05/10/2024 17:41    Procedures Procedures (including critical care time)  Medications Ordered in  UC Medications  acetaminophen  (TYLENOL ) tablet 650 mg (650 mg Oral Given 05/10/24 1805)    Initial Impression / Assessment and Plan / UC Course  I have reviewed the triage vital signs and the nursing notes.  Pertinent labs & imaging results that were available during my care of the patient were reviewed by me and considered in my medical decision making (see chart for details).      Final Clinical Impressions(s) / UC Diagnoses   Final diagnoses:  Closed fracture of base of fifth metatarsal bone of right foot  Patient presents today with concerns for right foot pain following a fall down the stairs.  Physical exam is notable for tenderness along the dorsum of the foot as well as mild swelling but no obvious signs of bruising or erythema.  Radiology review of imaging demonstrates nondisplaced fracture to the proximal base of the fifth metatarsal along with an acute comminuted and displaced calcaneal fracture and acute comminuted and displaced fracture of the superior aspect of the navicular.  Patient was made aware of the fracture to the fifth metatarsal during her appointment but at time of discharge we are still waiting on radiology interpretation of right foot images.  Patient was placed in a cam boot and provided with crutches to assist with ambulation.  Reviewed the importance of follow-up with orthopedics to ensure routine and appropriate recovery.  Home measures provided to assist with comfort while waiting orthopedic appointment.  Follow-up as needed for progressing or persistent symptoms     Discharge Instructions      You were seen today for concerns of right foot pain. Your imaging showed a fracture of the fifth metatarsal. We have provided you with a Cam boot to stabilize your foot and crutches so you can ambulate. Please make a follow up apt with Orthopedics for further evaluation and ongoing management to make sure this heals appropriately. You can take Tylenol  (acetaminophen ) as  needed for pain. For the next  48 hours I recommend using cool compresses or ice packs to the area to help prevent further swelling. After that you can switch to warm compresses. Please do not leave compresses on for longer than 15 minutes and take at least a 30 minute break to help prevent skin damage.   [facility] [address], [place], [place] [address] [phone]  [facility] [address], [place], [place] [address]  [phone]       ED Prescriptions   None    PDMP not reviewed this encounter.   Marylene Rocky BRAVO, PA-C 05/10/24 2331

## 2024-05-10 NOTE — Discharge Instructions (Addendum)
 You were seen today for concerns of right foot pain. Your imaging showed a fracture of the fifth metatarsal. We have provided you with a Cam boot to stabilize your foot and crutches so you can ambulate. Please make a follow up apt with Orthopedics for further evaluation and ongoing management to make sure this heals appropriately. You can take Tylenol  (acetaminophen ) as needed for pain. For the next 48 hours I recommend using cool compresses or ice packs to the area to help prevent further swelling. After that you can switch to warm compresses. Please do not leave compresses on for longer than 15 minutes and take at least a 30 minute break to help prevent skin damage.   [facility] [address], [address], [place], [place] [address] [phone]  [facility] [address] [place], [place] [address]  [phone]

## 2024-05-10 NOTE — ED Triage Notes (Signed)
 Pt presents to UC for c/o falling down steps and twisted right ankle today. Right ankle is swollen.

## 2024-05-15 ENCOUNTER — Ambulatory Visit: Attending: Cardiology

## 2024-05-15 DIAGNOSIS — I255 Ischemic cardiomyopathy: Secondary | ICD-10-CM

## 2024-05-15 NOTE — Progress Notes (Signed)
 Incision site continues to be swollen w/ small hematoma. Evaluated by Daphne Barrack, NP, noted slight improvement from last visit. Patient informed to resume plavix  starting tomorrow morning. Return office visit in 2 weeks to see Dr. Waddell. Patient informed to call device clinic if swelling increases or notices any changes. Patient verbalized understanding.

## 2024-05-15 NOTE — Patient Instructions (Signed)
   Monitor your pacemaker site for redness, increased swelling, and drainage. Call the device clinic at [phone] if you experience these symptoms or fever/chills.   May resume Plavix  starting tomorrow morning.

## 2024-05-16 ENCOUNTER — Telehealth: Payer: Self-pay | Admitting: Pulmonary Disease

## 2024-05-16 NOTE — Telephone Encounter (Signed)
 Patient called regarding holding Plavix  > reviewed with Dr. Waddell, continue to hold until next visit.  Patient to remain on ASA as previously instructed.      Exam from 05/15/24 in Device Clinic  Left chest insertion site protuberant (large device, petite patient), small hematoma on right border and over the device.  Prior left sided upper hematoma has largely resolved. Hematoma areas remain soft.     Pt to return to device clinic on day with Dr. Waddell is present.     Daphne Barrack, NP-C, AGACNP-BC [facility] - Electrophysiology  05/16/2024, 7:42 AM

## 2024-05-17 DIAGNOSIS — I129 Hypertensive chronic kidney disease with stage 1 through stage 4 chronic kidney disease, or unspecified chronic kidney disease: Secondary | ICD-10-CM | POA: Diagnosis not present

## 2024-05-17 DIAGNOSIS — N2581 Secondary hyperparathyroidism of renal origin: Secondary | ICD-10-CM | POA: Diagnosis not present

## 2024-05-17 DIAGNOSIS — N184 Chronic kidney disease, stage 4 (severe): Secondary | ICD-10-CM | POA: Diagnosis not present

## 2024-05-17 DIAGNOSIS — D631 Anemia in chronic kidney disease: Secondary | ICD-10-CM | POA: Diagnosis not present

## 2024-05-22 ENCOUNTER — Ambulatory Visit: Payer: Medicare HMO

## 2024-05-24 DIAGNOSIS — Z95810 Presence of automatic (implantable) cardiac defibrillator: Secondary | ICD-10-CM | POA: Diagnosis not present

## 2024-05-24 DIAGNOSIS — I10 Essential (primary) hypertension: Secondary | ICD-10-CM | POA: Diagnosis not present

## 2024-05-24 DIAGNOSIS — E7800 Pure hypercholesterolemia, unspecified: Secondary | ICD-10-CM | POA: Diagnosis not present

## 2024-05-24 DIAGNOSIS — N184 Chronic kidney disease, stage 4 (severe): Secondary | ICD-10-CM | POA: Diagnosis not present

## 2024-05-24 DIAGNOSIS — I2510 Atherosclerotic heart disease of native coronary artery without angina pectoris: Secondary | ICD-10-CM | POA: Diagnosis not present

## 2024-05-24 DIAGNOSIS — E1121 Type 2 diabetes mellitus with diabetic nephropathy: Secondary | ICD-10-CM | POA: Diagnosis not present

## 2024-05-24 DIAGNOSIS — I5022 Chronic systolic (congestive) heart failure: Secondary | ICD-10-CM | POA: Diagnosis not present

## 2024-05-28 ENCOUNTER — Other Ambulatory Visit: Payer: Self-pay | Admitting: Cardiovascular Disease

## 2024-05-28 NOTE — Progress Notes (Signed)
 Remote ICD transmission.

## 2024-05-30 ENCOUNTER — Ambulatory Visit: Attending: Internal Medicine | Admitting: *Deleted

## 2024-05-30 DIAGNOSIS — S92031A Displaced avulsion fracture of tuberosity of right calcaneus, initial encounter for closed fracture: Secondary | ICD-10-CM | POA: Diagnosis not present

## 2024-05-30 DIAGNOSIS — S92354A Nondisplaced fracture of fifth metatarsal bone, right foot, initial encounter for closed fracture: Secondary | ICD-10-CM | POA: Diagnosis not present

## 2024-05-30 DIAGNOSIS — S92251A Displaced fracture of navicular [scaphoid] of right foot, initial encounter for closed fracture: Secondary | ICD-10-CM | POA: Diagnosis not present

## 2024-05-30 DIAGNOSIS — Z950 Presence of cardiac pacemaker: Secondary | ICD-10-CM

## 2024-05-30 DIAGNOSIS — M79671 Pain in right foot: Secondary | ICD-10-CM | POA: Diagnosis not present

## 2024-05-30 NOTE — Progress Notes (Signed)
 Patient seen in clinic today to reassess hematoma site around pacemaker incision site. Surgical incision site appears well healed and swelling around device is minimal. GT in room to assess and instructed patient to continue taking Aspirin  ONLY, and to hold off of the Plavix  until her follow up appointment on 07/30/24. Patient verbalized understanding of instructions provided by Dr. Waddell. GT asked if the patient could be seen at the beginning of September rather than near the end. EP scheduling was messaged and sent the request. They responded and said they would call the patient today to schedule them earlier in September.

## 2024-05-31 ENCOUNTER — Encounter (HOSPITAL_COMMUNITY)
Admission: RE | Admit: 2024-05-31 | Discharge: 2024-05-31 | Disposition: A | Discharge status: Home / Self Care | Source: Ambulatory Visit | Attending: Nephrology | Admitting: Nephrology

## 2024-05-31 ENCOUNTER — Encounter

## 2024-05-31 VITALS — BP 142/88 | HR 71 | Temp 97.2°F | Resp 17

## 2024-05-31 DIAGNOSIS — E1151 Type 2 diabetes mellitus with diabetic peripheral angiopathy without gangrene: Secondary | ICD-10-CM | POA: Diagnosis not present

## 2024-05-31 DIAGNOSIS — I129 Hypertensive chronic kidney disease with stage 1 through stage 4 chronic kidney disease, or unspecified chronic kidney disease: Secondary | ICD-10-CM | POA: Diagnosis not present

## 2024-05-31 DIAGNOSIS — Z833 Family history of diabetes mellitus: Secondary | ICD-10-CM | POA: Diagnosis not present

## 2024-05-31 DIAGNOSIS — I2510 Atherosclerotic heart disease of native coronary artery without angina pectoris: Secondary | ICD-10-CM | POA: Diagnosis not present

## 2024-05-31 DIAGNOSIS — N184 Chronic kidney disease, stage 4 (severe): Secondary | ICD-10-CM | POA: Diagnosis not present

## 2024-05-31 DIAGNOSIS — D631 Anemia in chronic kidney disease: Secondary | ICD-10-CM | POA: Diagnosis not present

## 2024-05-31 DIAGNOSIS — N185 Chronic kidney disease, stage 5: Secondary | ICD-10-CM | POA: Diagnosis not present

## 2024-05-31 DIAGNOSIS — N183 Chronic kidney disease, stage 3 unspecified: Secondary | ICD-10-CM

## 2024-05-31 DIAGNOSIS — Z8673 Personal history of transient ischemic attack (TIA), and cerebral infarction without residual deficits: Secondary | ICD-10-CM | POA: Diagnosis not present

## 2024-05-31 LAB — IRON AND TIBC
Iron: 79 ug/dL (ref 28–170)
Saturation Ratios: 27 % (ref 10.4–31.8)
TIBC: 297 ug/dL (ref 250–450)
UIBC: 218 ug/dL

## 2024-05-31 LAB — FERRITIN: Ferritin: 187 ng/mL (ref 11–307)

## 2024-05-31 LAB — CBC
HCT: 31.1 % — ABNORMAL LOW (ref 36.0–46.0)
Hemoglobin: 10.4 g/dL — ABNORMAL LOW (ref 12.0–15.0)
MCH: 31.5 pg (ref 26.0–34.0)
MCHC: 33.4 g/dL (ref 30.0–36.0)
MCV: 94.2 fL (ref 80.0–100.0)
Platelets: 125 K/uL — ABNORMAL LOW (ref 150–400)
RBC: 3.3 MIL/uL — ABNORMAL LOW (ref 3.87–5.11)
RDW: 13.4 % (ref 11.5–15.5)
WBC: 5.6 K/uL (ref 4.0–10.5)
nRBC: 0 % (ref 0.0–0.2)

## 2024-05-31 LAB — POCT HEMOGLOBIN-HEMACUE: Hemoglobin: 10.1 g/dL — ABNORMAL LOW (ref 12.0–15.0)

## 2024-05-31 MED ORDER — EPOETIN ALFA-EPBX 10000 UNIT/ML IJ SOLN
10000.0000 [IU] | INTRAMUSCULAR | Status: DC
  Administered 2024-05-31: 10000 [IU] via SUBCUTANEOUS

## 2024-05-31 MED ORDER — EPOETIN ALFA-EPBX 10000 UNIT/ML IJ SOLN
INTRAMUSCULAR | Status: AC
  Filled 2024-05-31: qty 1

## 2024-06-01 ENCOUNTER — Telehealth: Payer: Self-pay | Admitting: Internal Medicine

## 2024-06-01 NOTE — Telephone Encounter (Signed)
 Called patient back about her message. Patient stated she has gained 3 pounds over night and needed advisement. Patient stated nephrology started patient back on lasix  80 mg BID until swelling goes down, then she will go back to Lasix  80 mg daily. Patient stated she was suppose to call our office from her discharge paperwork from the hospital if she gains more than 3 pounds in a 24 hour period. Will send message to Dr. Waddell for further advisement.

## 2024-06-01 NOTE — Telephone Encounter (Signed)
 Pt c/o swelling: STAT is pt has developed SOB within 24 hours  How much weight have you gained and in what time span? 3lbs over night  If swelling, where is the swelling located? Swelling in left leg and right leg (broke her right foot) and both hands  Are you currently taking a fluid pill? No  Are you currently SOB? No  Do you have a log of your daily weights (if so, list)? No  Have you gained 3 pounds in a day or 5 pounds in a week? 3lbs  Have you traveled recently? No  Patient is having major headaches for the last 3 nights and has been having dizziness (how she broke her foot at the hospital). Patient stated she can not turn too fast or she will get lightheaded. Patient stated she is having higher bp than normal. Pt stated her current BP has been ranging 150's/80's (off the top of her head) but her BP is normally 102-112/60's-70's. Please advise.

## 2024-06-05 ENCOUNTER — Other Ambulatory Visit: Payer: Self-pay | Admitting: Cardiovascular Disease

## 2024-06-05 DIAGNOSIS — I5022 Chronic systolic (congestive) heart failure: Secondary | ICD-10-CM | POA: Diagnosis not present

## 2024-06-05 DIAGNOSIS — I2510 Atherosclerotic heart disease of native coronary artery without angina pectoris: Secondary | ICD-10-CM | POA: Diagnosis not present

## 2024-06-05 DIAGNOSIS — I10 Essential (primary) hypertension: Secondary | ICD-10-CM | POA: Diagnosis not present

## 2024-06-05 DIAGNOSIS — N184 Chronic kidney disease, stage 4 (severe): Secondary | ICD-10-CM | POA: Diagnosis not present

## 2024-06-07 DIAGNOSIS — I10 Essential (primary) hypertension: Secondary | ICD-10-CM | POA: Diagnosis not present

## 2024-06-07 DIAGNOSIS — N184 Chronic kidney disease, stage 4 (severe): Secondary | ICD-10-CM | POA: Diagnosis not present

## 2024-06-07 DIAGNOSIS — I5022 Chronic systolic (congestive) heart failure: Secondary | ICD-10-CM | POA: Diagnosis not present

## 2024-06-07 DIAGNOSIS — I2510 Atherosclerotic heart disease of native coronary artery without angina pectoris: Secondary | ICD-10-CM | POA: Diagnosis not present

## 2024-06-07 DIAGNOSIS — J4520 Mild intermittent asthma, uncomplicated: Secondary | ICD-10-CM | POA: Diagnosis not present

## 2024-06-07 DIAGNOSIS — F4321 Adjustment disorder with depressed mood: Secondary | ICD-10-CM | POA: Diagnosis not present

## 2024-06-07 DIAGNOSIS — E7800 Pure hypercholesterolemia, unspecified: Secondary | ICD-10-CM | POA: Diagnosis not present

## 2024-06-10 NOTE — Telephone Encounter (Signed)
 Stay at 80 mg daily unless the weight continues to go up.

## 2024-06-11 DIAGNOSIS — E1151 Type 2 diabetes mellitus with diabetic peripheral angiopathy without gangrene: Secondary | ICD-10-CM | POA: Diagnosis not present

## 2024-06-11 NOTE — Telephone Encounter (Signed)
 Called patient back with Dr. Adrian advisement. Patient verbalized understanding.

## 2024-06-18 DIAGNOSIS — N184 Chronic kidney disease, stage 4 (severe): Secondary | ICD-10-CM | POA: Diagnosis not present

## 2024-06-21 ENCOUNTER — Ambulatory Visit (HOSPITAL_COMMUNITY)
Admission: RE | Admit: 2024-06-21 | Discharge: 2024-06-21 | Disposition: A | Discharge status: Home / Self Care | Source: Ambulatory Visit | Attending: Nephrology | Admitting: Nephrology

## 2024-06-21 VITALS — BP 128/81 | HR 75 | Temp 97.8°F | Resp 16

## 2024-06-21 DIAGNOSIS — N183 Chronic kidney disease, stage 3 unspecified: Secondary | ICD-10-CM | POA: Insufficient documentation

## 2024-06-21 DIAGNOSIS — D631 Anemia in chronic kidney disease: Secondary | ICD-10-CM | POA: Insufficient documentation

## 2024-06-21 LAB — CBC
HCT: 37.5 % (ref 36.0–46.0)
Hemoglobin: 12.5 g/dL (ref 12.0–15.0)
MCH: 30.2 pg (ref 26.0–34.0)
MCHC: 33.3 g/dL (ref 30.0–36.0)
MCV: 90.6 fL (ref 80.0–100.0)
Platelets: 208 K/uL (ref 150–400)
RBC: 4.14 MIL/uL (ref 3.87–5.11)
RDW: 12.8 % (ref 11.5–15.5)
WBC: 5.3 K/uL (ref 4.0–10.5)
nRBC: 0 % (ref 0.0–0.2)

## 2024-06-21 LAB — IRON AND TIBC
Iron: 105 ug/dL (ref 28–170)
Saturation Ratios: 30 % (ref 10.4–31.8)
TIBC: 346 ug/dL (ref 250–450)
UIBC: 241 ug/dL

## 2024-06-21 LAB — FERRITIN: Ferritin: 188 ng/mL (ref 11–307)

## 2024-06-21 LAB — POCT HEMOGLOBIN-HEMACUE: Hemoglobin: 12.6 g/dL (ref 12.0–15.0)

## 2024-06-21 MED ORDER — EPOETIN ALFA-EPBX 10000 UNIT/ML IJ SOLN
INTRAMUSCULAR | Status: AC
  Filled 2024-06-21: qty 1

## 2024-06-21 MED ORDER — EPOETIN ALFA-EPBX 10000 UNIT/ML IJ SOLN: 10000.0000 [IU] | INTRAMUSCULAR | Status: DC

## 2024-06-29 DIAGNOSIS — S92251D Displaced fracture of navicular [scaphoid] of right foot, subsequent encounter for fracture with routine healing: Secondary | ICD-10-CM | POA: Diagnosis not present

## 2024-06-29 DIAGNOSIS — S92031D Displaced avulsion fracture of tuberosity of right calcaneus, subsequent encounter for fracture with routine healing: Secondary | ICD-10-CM | POA: Diagnosis not present

## 2024-06-29 DIAGNOSIS — S92354D Nondisplaced fracture of fifth metatarsal bone, right foot, subsequent encounter for fracture with routine healing: Secondary | ICD-10-CM | POA: Diagnosis not present

## 2024-07-05 ENCOUNTER — Ambulatory Visit (HOSPITAL_COMMUNITY)
Admission: RE | Admit: 2024-07-05 | Discharge: 2024-07-05 | Disposition: A | Discharge status: Home / Self Care | Source: Ambulatory Visit | Attending: Nephrology | Admitting: Nephrology

## 2024-07-05 VITALS — BP 146/92 | HR 74 | Temp 97.9°F | Resp 16

## 2024-07-05 DIAGNOSIS — N184 Chronic kidney disease, stage 4 (severe): Secondary | ICD-10-CM | POA: Diagnosis not present

## 2024-07-05 DIAGNOSIS — I10 Essential (primary) hypertension: Secondary | ICD-10-CM | POA: Diagnosis not present

## 2024-07-05 DIAGNOSIS — I2510 Atherosclerotic heart disease of native coronary artery without angina pectoris: Secondary | ICD-10-CM | POA: Diagnosis not present

## 2024-07-05 DIAGNOSIS — I5022 Chronic systolic (congestive) heart failure: Secondary | ICD-10-CM | POA: Diagnosis not present

## 2024-07-05 DIAGNOSIS — N183 Chronic kidney disease, stage 3 unspecified: Secondary | ICD-10-CM | POA: Diagnosis not present

## 2024-07-05 DIAGNOSIS — D631 Anemia in chronic kidney disease: Secondary | ICD-10-CM | POA: Diagnosis not present

## 2024-07-05 LAB — POCT HEMOGLOBIN-HEMACUE: Hemoglobin: 11.2 g/dL — ABNORMAL LOW (ref 12.0–15.0)

## 2024-07-05 MED ORDER — EPOETIN ALFA-EPBX 10000 UNIT/ML IJ SOLN
10000.0000 [IU] | INTRAMUSCULAR | Status: DC
  Administered 2024-07-05: 10000 [IU] via SUBCUTANEOUS

## 2024-07-05 MED ORDER — EPOETIN ALFA-EPBX 10000 UNIT/ML IJ SOLN
INTRAMUSCULAR | Status: AC
Start: 2024-07-05 — End: 2024-07-05
  Filled 2024-07-05: qty 1

## 2024-07-08 DIAGNOSIS — F4321 Adjustment disorder with depressed mood: Secondary | ICD-10-CM | POA: Diagnosis not present

## 2024-07-08 DIAGNOSIS — I2510 Atherosclerotic heart disease of native coronary artery without angina pectoris: Secondary | ICD-10-CM | POA: Diagnosis not present

## 2024-07-08 DIAGNOSIS — E7800 Pure hypercholesterolemia, unspecified: Secondary | ICD-10-CM | POA: Diagnosis not present

## 2024-07-08 DIAGNOSIS — N184 Chronic kidney disease, stage 4 (severe): Secondary | ICD-10-CM | POA: Diagnosis not present

## 2024-07-08 DIAGNOSIS — I10 Essential (primary) hypertension: Secondary | ICD-10-CM | POA: Diagnosis not present

## 2024-07-08 DIAGNOSIS — I5022 Chronic systolic (congestive) heart failure: Secondary | ICD-10-CM | POA: Diagnosis not present

## 2024-07-08 DIAGNOSIS — J4520 Mild intermittent asthma, uncomplicated: Secondary | ICD-10-CM | POA: Diagnosis not present

## 2024-07-10 ENCOUNTER — Telehealth: Payer: Self-pay | Admitting: Internal Medicine

## 2024-07-10 NOTE — Telephone Encounter (Signed)
 Returned call to Pt.  She has a flight at 5:15 pm.    Will have Pt arrive early for afternoon appointment so she can get to the airport.

## 2024-07-10 NOTE — Telephone Encounter (Signed)
 Pt has a flight scheduled on Thursday and would like a c/b regarding whether or not it would be okay for her to fly since getting her defibrillator in June. Please advise.

## 2024-07-12 ENCOUNTER — Encounter: Payer: Self-pay | Admitting: Internal Medicine

## 2024-07-12 ENCOUNTER — Encounter (HOSPITAL_COMMUNITY)

## 2024-07-12 ENCOUNTER — Ambulatory Visit: Attending: Internal Medicine | Admitting: Internal Medicine

## 2024-07-12 VITALS — BP 125/80 | HR 80 | Ht 63.0 in | Wt 110.0 lb

## 2024-07-12 DIAGNOSIS — I5022 Chronic systolic (congestive) heart failure: Secondary | ICD-10-CM

## 2024-07-12 NOTE — Patient Instructions (Signed)

## 2024-07-12 NOTE — Progress Notes (Signed)
 HPI [name] returns today for followup. She is a pleasant 73 yo woman with CAD, s/p MI, with an ICM, s/p Biv ICD insertion. She has had trouble with weight gain and then was placed on Ozempic. She has lost another 40 lbs. She feels well. She appears much younger than her stated age. She also notes stage 4 renal failure.  She developed worsening SVT and underwent catheter ablation of AVNRT. In the interim she has undergone ICD gen change out with her new device placed under the pectoralis major due to her significant weight loss (60 lbs in 7 years) and she had extensive scar tissue and subsequently developed a hematoma. She has not any ICD therapies.  Allergies  Allergen Reactions   Calcium  Channel Blockers Other (See Comments)    Cannot take non-DHP CCBs (diltiazem, verapamil) due to CHF requiring ICD   Codeine Anaphylaxis and Other (See Comments)   Lyrica  [Pregabalin ]     Nightmares, and swelling   Other Anaphylaxis and Other (See Comments)    Walnuts, pecans, and ANY melons PATIENT IS A VEGETARIAN    Ramipril  Anaphylaxis, Swelling and Other (See Comments)   Ticlid [Ticlopidine Hcl] Other (See Comments)    Unprovoked bleeding while on Ticlid (doesn't think she was on ASA at the time) Takes Plavix  without problems   Morphine And Codeine Other (See Comments)    Severe AMS, confusion, weakness Tolerates Fentanyl , Tramadol    Tape Dermatitis and Rash    Tolerates paper tape   Cucumber Extract Other (See Comments)   Digoxin  Other (See Comments)   Digoxin  And Related Nausea Only    Unknown   Diltiazem Hcl Other (See Comments)   Metformin Hcl Other (See Comments)   Metolazone Other (See Comments)    Cannot remember reaction   Morphine Sulfate Other (See Comments)   Myrbetriq [Mirabegron] Other (See Comments)    Aggravates migraines   Nsaids Other (See Comments)   Onglyza [Saxagliptin] Other (See Comments)    Exacerbates migraines   Penicillins Hives    Has patient had  a PCN reaction causing immediate rash, facial/tongue/throat swelling, SOB or lightheadedness with hypotension: Yes Has patient had a PCN reaction causing severe rash involving mucus membranes or skin necrosis: Unknown Has patient had a PCN reaction that required hospitalization: Unknown Has patient had a PCN reaction occurring within the last 10 years: No If all of the above answers are NO, then may proceed with Cephalosporin use.    Spironolactone Other (See Comments)    Cannot remember reaction   Sulfa Antibiotics Hives and Other (See Comments)   Tetracyclines & Related Other (See Comments)    Cannot remember reaction Tolerates macrolides (azithromycin)   Ticlopidine Other (See Comments)   Verapamil Other (See Comments)    Pacemaker/CHF   Vicodin [Hydrocodone-Acetaminophen ] Other (See Comments)    EXTREME LETHARGY   Latex Itching, Rash and Other (See Comments)     Current Outpatient Medications  Medication Sig Dispense Refill   acetaminophen  (TYLENOL ) 500 MG tablet Take 1 tablet (500 mg total) by mouth every 6 (six) hours as needed for mild pain (pain score 1-3) or moderate pain (pain score 4-6).     albuterol  (PROVENTIL  HFA;VENTOLIN  HFA) 108 (90 BASE) MCG/ACT inhaler Inhale 2 puffs into the lungs every 4 (four) hours as needed for wheezing or shortness of breath.     Alcohol  Swabs  (B-D SINGLE USE SWABS  REGULAR) PADS      ALPRAZolam  (XANAX ) 1 MG tablet Take 1  mg by mouth 3 (three) times daily.   2   Artificial Tear GEL Place 2 drops into both eyes daily as needed (dry eyes).      aspirin  EC 81 MG tablet Take 4 tablets (325 mg total) by mouth daily. 120 tablet 11   BIOTIN PO Take 1 tablet by mouth daily.     cetirizine (ZYRTEC) 10 MG tablet Take 10 mg by mouth daily.     Cholecalciferol  (VITAMIN D -3) 1000 UNITS CAPS Take 1,000 Units by mouth daily.      doxazosin  (CARDURA ) 4 MG tablet Take 4 mg by mouth at bedtime.      DROPLET PEN NEEDLES 32G X 4 MM MISC      EPINEPHrine  0.3  mg/0.3 mL IJ SOAJ injection Inject 0.3 mg into the muscle as needed for anaphylaxis.   3   epoetin  alfa-epbx (RETACRIT ) 10000 UNIT/ML injection 10,000 Units every 21 ( twenty-one) days.     ezetimibe  (ZETIA ) 10 MG tablet TAKE 1 TABLET EVERY DAY (NEED MD APPOINTMENT FOR REFILLS) 15 tablet 0   Flaxseed, Linseed, (FLAXSEED OIL) 1200 MG CAPS Take 1,200 mg by mouth daily.     fluticasone  (FLONASE ) 50 MCG/ACT nasal spray Place 2 sprays into both nostrils daily as needed for allergies or rhinitis.     furosemide  (LASIX ) 80 MG tablet TAKE 1 TABLET TWICE A DAY. MAY TAKE AN ADDITIONAL 80MG  (1 TABLET) AS NEEDED FOR EDEMA 270 tablet 0   hydrALAZINE  (APRESOLINE ) 25 MG tablet TAKE 1 TABLET TWICE DAILY (NEED MD APPOINTMENT FOR REFILLS) 30 tablet 0   isosorbide  mononitrate (IMDUR ) 60 MG 24 hr tablet TAKE 1 TABLET EVERY DAY (NEED MD APPOINTMENT FOR REFILLS) 15 tablet 0   Ketotifen Fumarate (ALAWAY OP) Place 1 drop into both eyes daily as needed (allergies).      lidocaine  (LIDODERM ) 5 % Place 1 patch onto the skin daily. Remove & Discard patch within 12 hours or as directed by MD 3 patch 0   metoprolol  succinate (TOPROL -XL) 100 MG 24 hr tablet TAKE 2 TABLETS EVERY DAY WITH OR IMMEDIATELY FOLLOWING A MEAL (NEED MD APPOINTMENT FOR REFILLS) 30 tablet 0   metoprolol  tartrate (LOPRESSOR ) 25 MG tablet Take 1 tablet (25 mg total) by mouth every 6 (six) hours as needed (palpitations). 30 tablet 3   Multiple Vitamin (MULTIVITAMIN WITH MINERALS) TABS Take 1 tablet by mouth daily.     nitroGLYCERIN  (NITROSTAT ) 0.4 MG SL tablet Place 1 tablet under the tongue every 5 minutes as needed for chest pain, max 3 doses, go to er if no relief 75 tablet 2   OZEMPIC, 0.25 OR 0.5 MG/DOSE, 2 MG/1.5ML SOPN Inject 0.25 mg into the skin once a week. Sunday     pantoprazole  (PROTONIX ) 40 MG tablet TAKE 1 TABLET BY MOUTH  DAILY 90 tablet 3   potassium chloride  (KLOR-CON  M) 10 MEQ tablet TAKE 2 TABLETS TWICE A DAY (KEEP MD APPOINTMENT FOR  REFILLS) (Patient taking differently: Take 20 mEq by mouth daily. TAKE 2 TABLETS TWICE A DAY (KEEP MD APPOINTMENT FOR REFILLS)) 360 tablet 3   simvastatin  (ZOCOR ) 40 MG tablet TAKE 1 TABLET (40 MG TOTAL) BY MOUTH AT BEDTIME. 90 tablet 3   sodium chloride  (MURO 128) 5 % ophthalmic ointment Place 1 drop into both eyes at bedtime as needed for eye irritation. For dry eyes     traMADol  (ULTRAM ) 50 MG tablet Take 2 tablets (100 mg total) by mouth every 6 (six) hours as needed (MIGRAINES). (Patient taking differently:  Take 100 mg by mouth every 6 (six) hours as needed for moderate pain (pain score 4-6).) 40 tablet 0   TRUE METRIX BLOOD GLUCOSE TEST test strip      TRUEplus Lancets 33G MISC      venlafaxine  (EFFEXOR ) 100 MG tablet Take 100-200 mg by mouth See admin instructions. TAKE 200 mg by mouth in the morning and take 100 mg at noon     VITAMIN A PO Take 2,400 Units by mouth daily.     clopidogrel  (PLAVIX ) 75 MG tablet TAKE 1 TABLET EVERY DAY (NEED MD APPOINTMENT FOR REFILLS) (Patient not taking: Reported on 07/12/2024) 15 tablet 0   [Paused] LANTUS  SOLOSTAR 100 UNIT/ML Solostar Pen Inject 6 Units into the skin at bedtime. (Patient not taking: Reported on 07/12/2024)     [Paused] olmesartan  (BENICAR ) 40 MG tablet Take 1 tablet (40 mg total) by mouth daily. (Patient not taking: Reported on 07/12/2024) 90 tablet 2   No current facility-administered medications for this visit.     Past Medical History:  Diagnosis Date   AICD (automatic cardioverter/defibrillator) present    Medtronic- Dr. JUDITHANN [place] follows   Anginal pain (HCC)    Anxiety    Asthma    Back pain    pinched nerve-lower back - Dr. Bonner follows.   Biventricular implantable cardioverter-defibrillator in situ 06/18/2011   Cerebral infarction (HCC) 11/11/2012   Cervical dysplasia    Chronic diastolic heart failure (HCC) 03/22/2016   Chronic kidney disease    Dr. Tobie follows.   Chronic renal insufficiency, stage III (moderate) (HCC)  11/11/2012   Complication of anesthesia    I wake up during surgeries (02/14/2013)   Coronary artery disease involving coronary bypass graft of native heart with angina pectoris (HCC) 06/18/2011   Depression    DM (diabetes mellitus) (HCC) 11/11/2012   Fibroid    Function kidney decreased    GERD (gastroesophageal reflux disease)    Hemiplegia, unspecified, affecting nondominant side 11/11/2012   Hepatitis    Hepatitis A -college yrswater source exposure   Hiatal hernia    History of shingles    2-3 yrs ago last out break around waist   History of stomach ulcers    Hyperlipidemia 07/30/2016   Hypertension    ICD (implantable cardiac defibrillator) in place    Iron  deficiency anemia    Ischemic cardiomyopathy    status post biventricular ICD placed by DR Edumunds who used to see Dr Lavon here to establish  cardiovascular care.   Migraines    MVP (mitral valve prolapse)    Antibiotics not required for procedures   Myocardial infarction (HCC)    I've had 2; the others they were able to catch before completing (02/14/2013)   Pacemaker    Paroxysmal SVT (supraventricular tachycardia) (HCC) 06/18/2011   Pneumonia 1950's & 1985   Shortness of breath    lying down flat; at times w/exertion (02/14/2013). 10-06-15 exertion only..   Stroke (HCC)    2 confirmed; 9 TIA's; results in dragging LLE; numbness in tip of tongue (02/14/2013),10-06-15 right hand tends to be weaker when tired.   TIA (transient ischemic attack) 11/11/2012   Type II diabetes mellitus (HCC)     ROS:   All systems reviewed and negative except as noted in the HPI.   Past Surgical History:  Procedure Laterality Date   ABDOMINAL HYSTERECTOMY  1985   TAH    BIV ICD GENERATOR CHANGEOUT N/A 04/26/2024   Procedure: BIV ICD GENERATOR CHANGEOUT;  Surgeon: Waddell Danelle ORN, MD;  Location[facility] INVASIVE CV LAB;  Service: Cardiovascular;  Laterality: N/A;   BIV ICD GENERTAOR CHANGE OUT  06/2007; 04/2008   2 lead initial placement,  at [facility]; done at [facility], after developing CHF (02/14/2013)   BIV PACEMAKER GENERATOR CHANGE OUT N/A 01/29/2015   Procedure: BIV PACEMAKER GENERATOR CHANGE OUT;  Surgeon: Danelle ORN Waddell, MD;  Location: [facility] CATH LAB;  Service: Cardiovascular;  Laterality: N/A;   BREAST EXCISIONAL BIOPSY Left 01/2007; 06/2007; 03/2008   benign (02/14/2013)   BREAST SURGERY     CARDIAC CATHETERIZATION     probably in the teens (02/14/2013)   CARDIAC DEFIBRILLATOR PLACEMENT     COLONOSCOPY  ~ 2002   COLONOSCOPY WITH PROPOFOL  N/A 10/07/2015   Procedure: COLONOSCOPY WITH PROPOFOL ;  Surgeon: Renaye Sous, MD;  Location: [facility] ENDOSCOPY;  Service: Endoscopy;  Laterality: N/A;   CORONARY ANGIOPLASTY WITH STENT PLACEMENT     started out w/5; bypass corrected some; 1 stent since the bypass (02/14/2013)   CORONARY ARTERY BYPASS GRAFT  ` 1998   LIMA-LAD, SVG-D1, SVG-PDA   DILATION AND CURETTAGE OF UTERUS  1975 X 2; 1976; 1977   INSERT / REPLACE / REMOVE PACEMAKER     biventricular defibrillator--06/10/ 2009   LEFT HEART CATH AND CORS/GRAFTS ANGIOGRAPHY N/A 08/04/2018   Procedure: LEFT HEART CATH AND CORS/GRAFTS ANGIOGRAPHY;  Surgeon: Verlin Lonni BIRCH, MD;  Location: [facility] INVASIVE CV LAB;  Service: Cardiovascular;  Laterality: N/A;   LEFT HEART CATHETERIZATION WITH CORONARY/GRAFT ANGIOGRAM N/A 01/03/2015   Procedure: LEFT HEART CATHETERIZATION WITH EL BILE;  Surgeon: Victory ORN Claudene DOUGLAS, MD;  Location: [facility] CATH LAB;  Service: Cardiovascular;  Laterality: N/A;   SUPRAVENTRICULAR TACHYCARDIA ABLATION  06/2007   SVT ABLATION N/A 11/04/2023   Procedure: SVT ABLATION;  Surgeon: Waddell Danelle ORN, MD;  Location: [facility] INVASIVE CV LAB;  Service: Cardiovascular;  Laterality: N/A;   TEE WITHOUT CARDIOVERSION  11/14/2012   Procedure: TRANSESOPHAGEAL ECHOCARDIOGRAM (TEE);  Surgeon: Oneil Parchment, MD;  Location: [facility] ENDOSCOPY;  Service: Cardiovascular;  Laterality: N/A;     Family History  Problem Relation Age of Onset   Heart disease  Mother    Hypertension Mother    Heart attack Mother    Stroke Mother    Heart disease Father    Hypertension Father    Diabetes Father    Kidney failure Father    Heart attack Father    Stroke Father    Heart disease Brother    Diabetes Brother    Kidney failure Brother    Heart attack Brother    Diabetes Paternal Grandmother      Social History   Socioeconomic History   Marital status: Married    Spouse name: Ubaldo   Number of children: 1   Years of education: Not on file   Highest education level: Master's degree (e.g., MA, MS, MEng, MEd, MSW, MBA)  Occupational History    Employer: UNEMPLOYED    Comment: Disablity  Tobacco Use   Smoking status: Never   Smokeless tobacco: Never  Vaping Use   Vaping status: Never Used  Substance and Sexual Activity   Alcohol  use: No    Alcohol /week: 0.0 standard drinks of alcohol    Drug use: No   Sexual activity: Yes    Birth control/protection: Surgical  Other Topics Concern   Not on file  Social History Narrative   Caffeine Use: none   Social Drivers of Corporate investment banker Strain: Not on  file  Food Insecurity: No Food Insecurity (05/02/2024)   Hunger Vital Sign    Worried About Running Out of Food in the Last Year: Never true    Ran Out of Food in the Last Year: Never true  Transportation Needs: No Transportation Needs (05/02/2024)   PRAPARE - Administrator, Civil Service (Medical): No    Lack of Transportation (Non-Medical): No  Physical Activity: Not on file  Stress: Not on file  Social Connections: Unknown (05/02/2024)   Social Connection and Isolation Panel    Frequency of Communication with Friends and Family: Three times a week    Frequency of Social Gatherings with Friends and Family: Three times a week    Attends Religious Services: Patient declined    Active Member of Clubs or Organizations: Not on file    Attends Club or Organization Meetings: 1 to 4 times per year    Marital Status:  Patient declined  Intimate Partner Violence: Not At Risk (05/02/2024)   Humiliation, Afraid, Rape, and Kick questionnaire    Fear of Current or Ex-Partner: No    Emotionally Abused: No    Physically Abused: No    Sexually Abused: No     BP 125/80   Pulse 80   Ht 5' 3 (1.6 m)   Wt 110 lb (49.9 kg)   SpO2 96%   BMI 19.49 kg/m   Physical Exam:  Well appearing 73 yo woman, NAD HEENT: Unremarkable Neck:  No JVD, no thyromegally Lymphatics:  No adenopathy Back:  No CVA tenderness Lungs:  Clear with no wheezes HEART:  Regular rate rhythm, no murmurs, no rubs, no clicks Abd:  soft, positive bowel sounds, no organomegally, no rebound, no guarding Ext:  2 plus pulses, no edema, no cyanosis, no clubbing Skin:  No rashes no nodules Neuro:  CN II through XII intact, motor grossly intact  DEVICE  Normal device function.  See PaceArt for details.   Assess/Plan:  Chronic systolic heart failure - she appears to be class 2 but admits to being sedentary. I have encouraged the patient to increase her physical activity. She will continue her current meds and maintain a low sodium diet. 2. CAD - she denies anginal symptoms. She has rare episodes of non-cardiac chest pain. 3. ICD - her medtronic BiV ICD is working normally. Will follow. 4. Carotid vascular disease - now that she has healed from her gen change and her hematoma has resolved, she will restart her plavix .   Danelle Zyanne Schumm,MD

## 2024-07-13 ENCOUNTER — Telehealth: Payer: Self-pay | Admitting: Pharmacy Technician

## 2024-07-13 NOTE — Telephone Encounter (Signed)
 Auth Submission: APPROVED Site of care: Site of care: [facility] Payer: [employer] MEDICARE Medication & CPT/J Code(s) submitted: RETACRIT  Q5106 Diagnosis Code:  Route of submission (phone, fax, portal):  Phone # Fax # Auth type: Buy/Bill HB Units/visits requested: 10,000U Q21D Reference number: Auth# [id number] Approval from: 08/19/23 to 11/07/24

## 2024-07-18 DIAGNOSIS — D631 Anemia in chronic kidney disease: Secondary | ICD-10-CM | POA: Insufficient documentation

## 2024-07-25 DIAGNOSIS — F411 Generalized anxiety disorder: Secondary | ICD-10-CM | POA: Diagnosis not present

## 2024-07-26 ENCOUNTER — Ambulatory Visit (HOSPITAL_COMMUNITY)
Admission: RE | Admit: 2024-07-26 | Discharge: 2024-07-26 | Disposition: A | Discharge status: Home / Self Care | Source: Ambulatory Visit | Attending: Nephrology | Admitting: Nephrology

## 2024-07-26 VITALS — BP 126/79 | HR 76 | Temp 97.2°F | Resp 19

## 2024-07-26 DIAGNOSIS — D631 Anemia in chronic kidney disease: Secondary | ICD-10-CM | POA: Insufficient documentation

## 2024-07-26 DIAGNOSIS — N184 Chronic kidney disease, stage 4 (severe): Secondary | ICD-10-CM | POA: Insufficient documentation

## 2024-07-26 LAB — CBC
HCT: 37.5 % (ref 36.0–46.0)
Hemoglobin: 12.5 g/dL (ref 12.0–15.0)
MCH: 29.9 pg (ref 26.0–34.0)
MCHC: 33.3 g/dL (ref 30.0–36.0)
MCV: 89.7 fL (ref 80.0–100.0)
Platelets: 187 K/uL (ref 150–400)
RBC: 4.18 MIL/uL (ref 3.87–5.11)
RDW: 13.2 % (ref 11.5–15.5)
WBC: 6.2 K/uL (ref 4.0–10.5)
nRBC: 0 % (ref 0.0–0.2)

## 2024-07-26 LAB — FERRITIN: Ferritin: 143 ng/mL (ref 11–307)

## 2024-07-26 LAB — IRON AND TIBC
Iron: 87 ug/dL (ref 28–170)
Saturation Ratios: 26 % (ref 10.4–31.8)
TIBC: 330 ug/dL (ref 250–450)
UIBC: 243 ug/dL

## 2024-07-26 MED ORDER — EPOETIN ALFA-EPBX 10000 UNIT/ML IJ SOLN: 10000.0000 [IU] | Freq: Once | INTRAMUSCULAR | Status: DC

## 2024-07-28 ENCOUNTER — Other Ambulatory Visit: Payer: Self-pay | Admitting: Cardiovascular Disease

## 2024-07-30 ENCOUNTER — Ambulatory Visit (INDEPENDENT_AMBULATORY_CARE_PROVIDER_SITE_OTHER)

## 2024-07-30 ENCOUNTER — Ambulatory Visit: Admitting: Pulmonary Disease

## 2024-07-30 DIAGNOSIS — I5022 Chronic systolic (congestive) heart failure: Secondary | ICD-10-CM | POA: Diagnosis not present

## 2024-07-31 LAB — CUP PACEART REMOTE DEVICE CHECK
Battery Remaining Longevity: 132 mo
Battery Voltage: 3.13 V
Brady Statistic AP VP Percent: 0.04 %
Brady Statistic AP VS Percent: 0.01 %
Brady Statistic AS VP Percent: 98.43 %
Brady Statistic AS VS Percent: 1.52 %
Brady Statistic RA Percent Paced: 0.05 %
Brady Statistic RV Percent Paced: 0.14 %
Date Time Interrogation Session: 20250921195006
HighPow Impedance: 30 Ohm
HighPow Impedance: 47 Ohm
Lead Channel Impedance Value: 228 Ohm
Lead Channel Impedance Value: 304 Ohm
Lead Channel Impedance Value: 399 Ohm
Lead Channel Impedance Value: 475 Ohm
Lead Channel Impedance Value: 494 Ohm
Lead Channel Impedance Value: 627 Ohm
Lead Channel Pacing Threshold Amplitude: 0.75 V
Lead Channel Pacing Threshold Amplitude: 0.875 V
Lead Channel Pacing Threshold Amplitude: 1.125 V
Lead Channel Pacing Threshold Pulse Width: 0.4 ms
Lead Channel Pacing Threshold Pulse Width: 0.4 ms
Lead Channel Pacing Threshold Pulse Width: 0.4 ms
Lead Channel Sensing Intrinsic Amplitude: 1.9 mV
Lead Channel Sensing Intrinsic Amplitude: 5.4 mV
Lead Channel Setting Pacing Amplitude: 1.5 V
Lead Channel Setting Pacing Amplitude: 1.75 V
Lead Channel Setting Pacing Amplitude: 2 V
Lead Channel Setting Pacing Pulse Width: 0.4 ms
Lead Channel Setting Pacing Pulse Width: 0.4 ms
Lead Channel Setting Sensing Sensitivity: 0.3 mV
Zone Setting Status: 755011
Zone Setting Status: 755011

## 2024-07-31 LAB — POCT HEMOGLOBIN-HEMACUE: Hemoglobin: 12.7 g/dL (ref 12.0–15.0)

## 2024-08-01 ENCOUNTER — Other Ambulatory Visit: Payer: Self-pay

## 2024-08-01 MED ORDER — FUROSEMIDE 80 MG PO TABS: ORAL_TABLET | ORAL | 3 refills | Status: AC

## 2024-08-01 NOTE — Progress Notes (Signed)
Remote ICD Transmission.

## 2024-08-02 ENCOUNTER — Encounter: Admitting: Pulmonary Disease

## 2024-08-05 ENCOUNTER — Ambulatory Visit: Payer: Self-pay | Admitting: Internal Medicine

## 2024-08-06 ENCOUNTER — Ambulatory Visit: Attending: Cardiovascular Disease | Admitting: Cardiovascular Disease

## 2024-08-06 ENCOUNTER — Encounter: Payer: Self-pay | Admitting: Cardiovascular Disease

## 2024-08-06 VITALS — BP 120/80 | HR 67 | Ht 63.0 in | Wt 109.0 lb

## 2024-08-06 DIAGNOSIS — Z95810 Presence of automatic (implantable) cardiac defibrillator: Secondary | ICD-10-CM | POA: Diagnosis not present

## 2024-08-06 DIAGNOSIS — I471 Supraventricular tachycardia, unspecified: Secondary | ICD-10-CM | POA: Diagnosis not present

## 2024-08-06 DIAGNOSIS — I10 Essential (primary) hypertension: Secondary | ICD-10-CM | POA: Diagnosis not present

## 2024-08-06 DIAGNOSIS — I25709 Atherosclerosis of coronary artery bypass graft(s), unspecified, with unspecified angina pectoris: Secondary | ICD-10-CM | POA: Diagnosis not present

## 2024-08-06 DIAGNOSIS — E782 Mixed hyperlipidemia: Secondary | ICD-10-CM | POA: Diagnosis not present

## 2024-08-06 MED ORDER — HYDRALAZINE HCL 25 MG PO TABS: 25.0000 mg | ORAL_TABLET | Freq: Two times a day (BID) | ORAL | 4 refills | Status: AC

## 2024-08-06 MED ORDER — SIMVASTATIN 40 MG PO TABS: 40.0000 mg | ORAL_TABLET | Freq: Every day | ORAL | 4 refills | Status: AC

## 2024-08-06 MED ORDER — POTASSIUM CHLORIDE CRYS ER 10 MEQ PO TBCR: EXTENDED_RELEASE_TABLET | ORAL | 3 refills | Status: AC

## 2024-08-06 MED ORDER — METOPROLOL TARTRATE 25 MG PO TABS: 25.0000 mg | ORAL_TABLET | Freq: Four times a day (QID) | ORAL | 3 refills | Status: AC | PRN

## 2024-08-06 NOTE — Assessment & Plan Note (Signed)
 History of BiV ICD implant by Dr. Waddell in 2016 with generator change 04/26/2024.  She has not had a discharge.

## 2024-08-06 NOTE — Assessment & Plan Note (Signed)
 History of essential hypertension blood pressure measured today 120/80.  She is on metoprolol , hydralazine  and olmesartan .
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
## 2024-08-06 NOTE — Assessment & Plan Note (Signed)
 History of SVT ablation in [place] years ago with recent ablation by Dr. Waddell 10/28/2023 without recurrence.

## 2024-08-06 NOTE — Patient Instructions (Addendum)
 Medication Instructions:  Your physician recommends that you continue on your current medications as directed. Please refer to the Current Medication list given to you today.   *If you need a refill on your cardiac medications before your next appointment, please call your pharmacy*  Lab Work: Your physician recommends that you return for lab work in: 3 months for FASTING lipid/liver panel   If you have labs (blood work) drawn today and your tests are completely normal, you will receive your results only by: MyChart Message (if you have MyChart) OR A paper copy in the mail If you have any lab test that is abnormal or we need to change your treatment, we will call you to review the results.   Follow-Up: At [facility], you and your health needs are our priority.  As part of our continuing mission to provide you with exceptional heart care, our providers are all part of one team.  This team includes your primary Cardiologist (physician) and Advanced Practice Providers or APPs (Physician Assistants and Nurse Practitioners) who all work together to provide you with the care you need, when you need it.  Your next appointment:   6 month(s)  Provider:   Jon Hails, PA-C, Callie Goodrich, PA-C, Kathleen Johnson, PA-C, Damien Braver, NP, or Katlyn West, NP         Then, Dorn Lesches, MD will plan to see you again in 12 month(s).

## 2024-08-06 NOTE — Assessment & Plan Note (Signed)
 History of CAD status post multiple catheterizations that I performed in her back in the 90s ultimately requiring CABG in 1998 with a LIMA to LAD, vein to a diagonal branch and PDA.  She is completely asymptomatic on aspirin  and clopidogrel .

## 2024-08-06 NOTE — Assessment & Plan Note (Signed)
 History of hyperlipidemia on simvastatin  40 mg a day with recent addition of Zetia  10 mg a day by Dr. Waddell.  Her most recent lipid profile performed 12/01/2023 revealed total cholesterol 179, LDL of 82 and HDL of 82, not at goal for secondary prevention.  Will recheck her fasting lipid profile in 3 months.

## 2024-08-06 NOTE — Assessment & Plan Note (Signed)
 History of ischemic cardiomyopathy with EF by 2D echo of 45 to 50% 10/20/2023 with no significant valvular abnormalities.  She is on GDMT and is asymptomatic.

## 2024-08-07 DIAGNOSIS — E7800 Pure hypercholesterolemia, unspecified: Secondary | ICD-10-CM | POA: Diagnosis not present

## 2024-08-07 DIAGNOSIS — J4520 Mild intermittent asthma, uncomplicated: Secondary | ICD-10-CM | POA: Diagnosis not present

## 2024-08-07 DIAGNOSIS — N184 Chronic kidney disease, stage 4 (severe): Secondary | ICD-10-CM | POA: Diagnosis not present

## 2024-08-07 DIAGNOSIS — I2510 Atherosclerotic heart disease of native coronary artery without angina pectoris: Secondary | ICD-10-CM | POA: Diagnosis not present

## 2024-08-07 DIAGNOSIS — I5022 Chronic systolic (congestive) heart failure: Secondary | ICD-10-CM | POA: Diagnosis not present

## 2024-08-07 DIAGNOSIS — I10 Essential (primary) hypertension: Secondary | ICD-10-CM | POA: Diagnosis not present

## 2024-08-07 DIAGNOSIS — F4321 Adjustment disorder with depressed mood: Secondary | ICD-10-CM | POA: Diagnosis not present

## 2024-08-08 DIAGNOSIS — M79671 Pain in right foot: Secondary | ICD-10-CM | POA: Diagnosis not present

## 2024-08-08 DIAGNOSIS — S92354D Nondisplaced fracture of fifth metatarsal bone, right foot, subsequent encounter for fracture with routine healing: Secondary | ICD-10-CM | POA: Diagnosis not present

## 2024-08-09 ENCOUNTER — Inpatient Hospital Stay (HOSPITAL_COMMUNITY)
Admission: RE | Admit: 2024-08-09 | Discharge: 2024-08-09 | Disposition: A | Source: Ambulatory Visit | Attending: Nephrology

## 2024-08-09 DIAGNOSIS — I679 Cerebrovascular disease, unspecified: Secondary | ICD-10-CM | POA: Diagnosis not present

## 2024-08-09 DIAGNOSIS — E1121 Type 2 diabetes mellitus with diabetic nephropathy: Secondary | ICD-10-CM | POA: Diagnosis not present

## 2024-08-09 DIAGNOSIS — E1151 Type 2 diabetes mellitus with diabetic peripheral angiopathy without gangrene: Secondary | ICD-10-CM | POA: Diagnosis not present

## 2024-08-09 DIAGNOSIS — N184 Chronic kidney disease, stage 4 (severe): Secondary | ICD-10-CM | POA: Insufficient documentation

## 2024-08-09 DIAGNOSIS — Z0000 Encounter for general adult medical examination without abnormal findings: Secondary | ICD-10-CM | POA: Diagnosis not present

## 2024-08-09 DIAGNOSIS — Z23 Encounter for immunization: Secondary | ICD-10-CM | POA: Diagnosis not present

## 2024-08-09 DIAGNOSIS — Z1331 Encounter for screening for depression: Secondary | ICD-10-CM | POA: Diagnosis not present

## 2024-08-09 DIAGNOSIS — E7800 Pure hypercholesterolemia, unspecified: Secondary | ICD-10-CM | POA: Diagnosis not present

## 2024-08-09 DIAGNOSIS — D631 Anemia in chronic kidney disease: Secondary | ICD-10-CM | POA: Diagnosis not present

## 2024-08-09 DIAGNOSIS — E1142 Type 2 diabetes mellitus with diabetic polyneuropathy: Secondary | ICD-10-CM | POA: Diagnosis not present

## 2024-08-09 DIAGNOSIS — I5022 Chronic systolic (congestive) heart failure: Secondary | ICD-10-CM | POA: Diagnosis not present

## 2024-08-09 LAB — POCT HEMOGLOBIN-HEMACUE: Hemoglobin: 11.5 g/dL — ABNORMAL LOW (ref 12.0–15.0)

## 2024-08-09 MED ORDER — EPOETIN ALFA-EPBX 10000 UNIT/ML IJ SOLN
INTRAMUSCULAR | Status: AC
  Filled 2024-08-09: qty 1

## 2024-08-09 MED ORDER — EPOETIN ALFA-EPBX 10000 UNIT/ML IJ SOLN
10000.0000 [IU] | Freq: Once | INTRAMUSCULAR | Status: AC
  Administered 2024-08-09: 10000 [IU] via SUBCUTANEOUS

## 2024-08-16 ENCOUNTER — Encounter (HOSPITAL_COMMUNITY)

## 2024-08-21 ENCOUNTER — Ambulatory Visit: Payer: Medicare HMO

## 2024-08-27 ENCOUNTER — Telehealth: Payer: Self-pay

## 2024-08-27 NOTE — Telephone Encounter (Signed)
 CRT-D Alert remote transmission:  HF diagnostics currently abnormal   Patient states she has noticed some swelling in her extremities and SOB with exertion.  Overall, says she hasn't felt as well in past few days.   She is on Lasix  80mg  daily and was told to take an extra pill until her symptoms improved.  I told her then to go ahead and take the extra one for the next 2 days and watch her sodium intake. She is to call if not improving over next few days.   Also, she is asking if she should go back on her Olmesartan  that was discontinued in June at the hospital.  I will defer that to Dr. Sarina. Waddell.    Forwarding for review and if any further recommendations. Patient currently does not have follow up scheduled - just recently seen in September.

## 2024-08-29 NOTE — Telephone Encounter (Signed)
 Left voice message to call back 10/22

## 2024-08-29 NOTE — Telephone Encounter (Signed)
 Agree

## 2024-08-30 ENCOUNTER — Ambulatory Visit (HOSPITAL_COMMUNITY)
Admission: RE | Admit: 2024-08-30 | Discharge: 2024-08-30 | Disposition: A | Discharge status: Home / Self Care | Source: Ambulatory Visit | Attending: Nephrology | Admitting: Nephrology

## 2024-08-30 VITALS — BP 126/86 | HR 81 | Temp 97.0°F | Resp 16

## 2024-08-30 DIAGNOSIS — D631 Anemia in chronic kidney disease: Secondary | ICD-10-CM | POA: Insufficient documentation

## 2024-08-30 DIAGNOSIS — N184 Chronic kidney disease, stage 4 (severe): Secondary | ICD-10-CM | POA: Diagnosis not present

## 2024-08-30 DIAGNOSIS — H04123 Dry eye syndrome of bilateral lacrimal glands: Secondary | ICD-10-CM | POA: Diagnosis not present

## 2024-08-30 DIAGNOSIS — Z961 Presence of intraocular lens: Secondary | ICD-10-CM | POA: Diagnosis not present

## 2024-08-30 DIAGNOSIS — H26492 Other secondary cataract, left eye: Secondary | ICD-10-CM | POA: Diagnosis not present

## 2024-08-30 DIAGNOSIS — H35033 Hypertensive retinopathy, bilateral: Secondary | ICD-10-CM | POA: Diagnosis not present

## 2024-08-30 DIAGNOSIS — E119 Type 2 diabetes mellitus without complications: Secondary | ICD-10-CM | POA: Diagnosis not present

## 2024-08-30 LAB — IRON AND TIBC
Iron: 99 ug/dL (ref 28–170)
Saturation Ratios: 29 % (ref 10.4–31.8)
TIBC: 339 ug/dL (ref 250–450)
UIBC: 240 ug/dL

## 2024-08-30 LAB — CBC
HCT: 38.2 % (ref 36.0–46.0)
Hemoglobin: 12.7 g/dL (ref 12.0–15.0)
MCH: 29.3 pg (ref 26.0–34.0)
MCHC: 33.2 g/dL (ref 30.0–36.0)
MCV: 88.2 fL (ref 80.0–100.0)
Platelets: 204 K/uL (ref 150–400)
RBC: 4.33 MIL/uL (ref 3.87–5.11)
RDW: 13.9 % (ref 11.5–15.5)
WBC: 6.2 K/uL (ref 4.0–10.5)
nRBC: 0 % (ref 0.0–0.2)

## 2024-08-30 LAB — FERRITIN: Ferritin: 140 ng/mL (ref 11–307)

## 2024-08-30 LAB — POCT HEMOGLOBIN-HEMACUE: Hemoglobin: 12.3 g/dL (ref 12.0–15.0)

## 2024-08-30 MED ORDER — EPOETIN ALFA-EPBX 10000 UNIT/ML IJ SOLN: 10000.0000 [IU] | Freq: Once | INTRAMUSCULAR | Status: DC

## 2024-08-30 NOTE — Telephone Encounter (Signed)
 Court Dorn PARAS, MD to [facility] Triage     08/29/24  4:00 PM With serum creatinine of 3.75 I would probably avoid an ARB.  Pt aware of the above information.   Pt reports she understood Alan was going to recheck her device and let her know if she should continue increased dose of Furosemide  or return to the 80 mg once a day.  She reports her edema in her legs is better/no increase in it at all and has not noticed having increase in SOB with exertion (but also hasn't done much to exert herself).   Aware I will forward this to [facility] for follow up.

## 2024-08-30 NOTE — Telephone Encounter (Signed)
 Patient is returning call.

## 2024-08-30 NOTE — Progress Notes (Signed)
 RETACRIT  DID NOT RELEASE BUT NOT GIVEN/NEEDED, HEMOCUE 12.3.

## 2024-08-30 NOTE — Telephone Encounter (Signed)
 Patient was instructed only to take the extra Furosemide  80mg  for 2 days.    Repeat transmission reviewed.   Optivol is now (as of today) taken a sharp drop back in range.  Patient is feeling better.   I have re-iterated with her to STOP taking 2, 80mg  Furosemide  and go back to the 1, 80mg  Furosemide  maintenance dose. Continue to monitor her symptoms and call us  back if she notices any weight gain, edema or return of her prior symptoms.  We will continue to monitor transmissions from her device.

## 2024-09-07 DIAGNOSIS — I10 Essential (primary) hypertension: Secondary | ICD-10-CM | POA: Diagnosis not present

## 2024-09-07 DIAGNOSIS — I5022 Chronic systolic (congestive) heart failure: Secondary | ICD-10-CM | POA: Diagnosis not present

## 2024-09-07 DIAGNOSIS — F4321 Adjustment disorder with depressed mood: Secondary | ICD-10-CM | POA: Diagnosis not present

## 2024-09-07 DIAGNOSIS — I2510 Atherosclerotic heart disease of native coronary artery without angina pectoris: Secondary | ICD-10-CM | POA: Diagnosis not present

## 2024-09-07 DIAGNOSIS — E7800 Pure hypercholesterolemia, unspecified: Secondary | ICD-10-CM | POA: Diagnosis not present

## 2024-09-07 DIAGNOSIS — J4520 Mild intermittent asthma, uncomplicated: Secondary | ICD-10-CM | POA: Diagnosis not present

## 2024-09-07 DIAGNOSIS — N184 Chronic kidney disease, stage 4 (severe): Secondary | ICD-10-CM | POA: Diagnosis not present

## 2024-09-09 DIAGNOSIS — E1151 Type 2 diabetes mellitus with diabetic peripheral angiopathy without gangrene: Secondary | ICD-10-CM | POA: Diagnosis not present

## 2024-09-11 DIAGNOSIS — N184 Chronic kidney disease, stage 4 (severe): Secondary | ICD-10-CM | POA: Diagnosis not present

## 2024-09-17 DIAGNOSIS — I129 Hypertensive chronic kidney disease with stage 1 through stage 4 chronic kidney disease, or unspecified chronic kidney disease: Secondary | ICD-10-CM | POA: Diagnosis not present

## 2024-09-17 DIAGNOSIS — D631 Anemia in chronic kidney disease: Secondary | ICD-10-CM | POA: Diagnosis not present

## 2024-09-17 DIAGNOSIS — N2581 Secondary hyperparathyroidism of renal origin: Secondary | ICD-10-CM | POA: Diagnosis not present

## 2024-09-17 DIAGNOSIS — N184 Chronic kidney disease, stage 4 (severe): Secondary | ICD-10-CM | POA: Diagnosis not present

## 2024-09-20 ENCOUNTER — Ambulatory Visit (HOSPITAL_COMMUNITY)
Admission: RE | Admit: 2024-09-20 | Discharge: 2024-09-20 | Disposition: A | Discharge status: Home / Self Care | Source: Ambulatory Visit | Attending: Nephrology | Admitting: Nephrology

## 2024-09-20 VITALS — BP 136/86 | HR 78 | Temp 97.1°F | Resp 16

## 2024-09-20 DIAGNOSIS — D631 Anemia in chronic kidney disease: Secondary | ICD-10-CM | POA: Diagnosis not present

## 2024-09-20 DIAGNOSIS — N184 Chronic kidney disease, stage 4 (severe): Secondary | ICD-10-CM | POA: Insufficient documentation

## 2024-09-20 LAB — CBC
HCT: 34.7 % — ABNORMAL LOW (ref 36.0–46.0)
Hemoglobin: 11.5 g/dL — ABNORMAL LOW (ref 12.0–15.0)
MCH: 29.3 pg (ref 26.0–34.0)
MCHC: 33.1 g/dL (ref 30.0–36.0)
MCV: 88.3 fL (ref 80.0–100.0)
Platelets: 187 K/uL (ref 150–400)
RBC: 3.93 MIL/uL (ref 3.87–5.11)
RDW: 13.8 % (ref 11.5–15.5)
WBC: 5.3 K/uL (ref 4.0–10.5)
nRBC: 0 % (ref 0.0–0.2)

## 2024-09-20 LAB — FERRITIN: Ferritin: 192 ng/mL (ref 11–307)

## 2024-09-20 LAB — IRON AND TIBC
Iron: 131 ug/dL (ref 28–170)
Saturation Ratios: 44 % — ABNORMAL HIGH (ref 10.4–31.8)
TIBC: 300 ug/dL (ref 250–450)
UIBC: 169 ug/dL

## 2024-09-20 LAB — POCT HEMOGLOBIN-HEMACUE: Hemoglobin: 11.4 g/dL — ABNORMAL LOW (ref 12.0–15.0)

## 2024-09-20 MED ORDER — EPOETIN ALFA-EPBX 10000 UNIT/ML IJ SOLN
INTRAMUSCULAR | Status: AC
  Filled 2024-09-20: qty 1

## 2024-09-20 MED ORDER — EPOETIN ALFA-EPBX 10000 UNIT/ML IJ SOLN
10000.0000 [IU] | Freq: Once | INTRAMUSCULAR | Status: AC
  Administered 2024-09-20: 10000 [IU] via SUBCUTANEOUS

## 2024-10-07 DIAGNOSIS — J4520 Mild intermittent asthma, uncomplicated: Secondary | ICD-10-CM | POA: Diagnosis not present

## 2024-10-07 DIAGNOSIS — E7800 Pure hypercholesterolemia, unspecified: Secondary | ICD-10-CM | POA: Diagnosis not present

## 2024-10-07 DIAGNOSIS — F4321 Adjustment disorder with depressed mood: Secondary | ICD-10-CM | POA: Diagnosis not present

## 2024-10-07 DIAGNOSIS — I5022 Chronic systolic (congestive) heart failure: Secondary | ICD-10-CM | POA: Diagnosis not present

## 2024-10-11 ENCOUNTER — Inpatient Hospital Stay (HOSPITAL_COMMUNITY): Admission: RE | Admit: 2024-10-11 | Discharge: 2024-10-11 | Attending: Nephrology

## 2024-10-11 VITALS — BP 110/85 | HR 74 | Temp 97.7°F | Resp 15

## 2024-10-11 DIAGNOSIS — D631 Anemia in chronic kidney disease: Secondary | ICD-10-CM | POA: Diagnosis present

## 2024-10-11 DIAGNOSIS — N184 Chronic kidney disease, stage 4 (severe): Secondary | ICD-10-CM | POA: Insufficient documentation

## 2024-10-11 LAB — IRON AND TIBC
Iron: 93 ug/dL (ref 28–170)
Saturation Ratios: 30 % (ref 10.4–31.8)
TIBC: 314 ug/dL (ref 250–450)
UIBC: 221 ug/dL

## 2024-10-11 LAB — CBC
HCT: 33.8 % — ABNORMAL LOW (ref 36.0–46.0)
Hemoglobin: 11.3 g/dL — ABNORMAL LOW (ref 12.0–15.0)
MCH: 30.1 pg (ref 26.0–34.0)
MCHC: 33.4 g/dL (ref 30.0–36.0)
MCV: 89.9 fL (ref 80.0–100.0)
Platelets: 184 K/uL (ref 150–400)
RBC: 3.76 MIL/uL — ABNORMAL LOW (ref 3.87–5.11)
RDW: 14.4 % (ref 11.5–15.5)
WBC: 5.9 K/uL (ref 4.0–10.5)
nRBC: 0 % (ref 0.0–0.2)

## 2024-10-11 LAB — POCT HEMOGLOBIN-HEMACUE: Hemoglobin: 11.4 g/dL — ABNORMAL LOW (ref 12.0–15.0)

## 2024-10-11 LAB — FERRITIN: Ferritin: 154 ng/mL (ref 11–307)

## 2024-10-11 MED ORDER — EPOETIN ALFA-EPBX 10000 UNIT/ML IJ SOLN
INTRAMUSCULAR | Status: AC
  Filled 2024-10-11: qty 1

## 2024-10-11 MED ORDER — EPOETIN ALFA-EPBX 10000 UNIT/ML IJ SOLN
10000.0000 [IU] | Freq: Once | INTRAMUSCULAR | Status: AC
  Administered 2024-10-11: 10000 [IU] via SUBCUTANEOUS

## 2024-10-24 ENCOUNTER — Telehealth (HOSPITAL_COMMUNITY): Payer: Self-pay

## 2024-10-24 NOTE — Telephone Encounter (Signed)
 Auth Submission: APPROVED Site of care: Site of care: [facility] Payer: [employer] Medicare Medication & CPT/J Code(s) submitted: Retacrit  ([id number]) Diagnosis Code: N18.4/D63.1 Route of submission (phone, fax, portal): portal Phone # Fax # Auth type: Buy/Bill HB Units/visits requested: 10000 units q3weeks Reference number: [id number] Approval from: 11/08/24 to 11/07/25    Approval letter has been scanned into patient's media tab.

## 2024-10-29 ENCOUNTER — Ambulatory Visit

## 2024-10-29 DIAGNOSIS — I10 Essential (primary) hypertension: Secondary | ICD-10-CM

## 2024-10-30 LAB — CUP PACEART REMOTE DEVICE CHECK
Battery Remaining Longevity: 132 mo
Battery Voltage: 3.06 V
Brady Statistic RV Percent Paced: 0.07 %
Date Time Interrogation Session: 20251221221339
HighPow Impedance: 29 Ohm
HighPow Impedance: 45 Ohm
Lead Channel Impedance Value: 209 Ohm
Lead Channel Impedance Value: 304 Ohm
Lead Channel Impedance Value: 399 Ohm
Lead Channel Impedance Value: 456 Ohm
Lead Channel Impedance Value: 513 Ohm
Lead Channel Impedance Value: 665 Ohm
Lead Channel Pacing Threshold Amplitude: 0.625 V
Lead Channel Pacing Threshold Amplitude: 0.875 V
Lead Channel Pacing Threshold Amplitude: 1.125 V
Lead Channel Pacing Threshold Pulse Width: 0.4 ms
Lead Channel Pacing Threshold Pulse Width: 0.4 ms
Lead Channel Pacing Threshold Pulse Width: 0.4 ms
Lead Channel Sensing Intrinsic Amplitude: 2.4 mV
Lead Channel Sensing Intrinsic Amplitude: 5.1 mV
Lead Channel Setting Pacing Amplitude: 1.5 V
Lead Channel Setting Pacing Amplitude: 1.75 V
Lead Channel Setting Pacing Amplitude: 2 V
Lead Channel Setting Pacing Pulse Width: 0.4 ms
Lead Channel Setting Pacing Pulse Width: 0.4 ms
Lead Channel Setting Sensing Sensitivity: 0.3 mV
Zone Setting Status: 755011
Zone Setting Status: 755011

## 2024-10-31 NOTE — Progress Notes (Signed)
 Remote ICD Transmission

## 2024-11-02 ENCOUNTER — Ambulatory Visit (HOSPITAL_COMMUNITY)
Admission: RE | Admit: 2024-11-02 | Discharge: 2024-11-02 | Disposition: A | Discharge status: Home / Self Care | Source: Ambulatory Visit | Attending: Nephrology | Admitting: Nephrology

## 2024-11-02 VITALS — BP 132/82 | HR 71 | Temp 97.2°F | Resp 15

## 2024-11-02 DIAGNOSIS — N184 Chronic kidney disease, stage 4 (severe): Secondary | ICD-10-CM | POA: Diagnosis not present

## 2024-11-02 DIAGNOSIS — D631 Anemia in chronic kidney disease: Secondary | ICD-10-CM

## 2024-11-02 LAB — CBC
HCT: 36.8 % (ref 36.0–46.0)
Hemoglobin: 12.5 g/dL (ref 12.0–15.0)
MCH: 31.4 pg (ref 26.0–34.0)
MCHC: 34 g/dL (ref 30.0–36.0)
MCV: 92.5 fL (ref 80.0–100.0)
Platelets: 175 K/uL (ref 150–400)
RBC: 3.98 MIL/uL (ref 3.87–5.11)
RDW: 13.8 % (ref 11.5–15.5)
WBC: 5.5 K/uL (ref 4.0–10.5)
nRBC: 0 % (ref 0.0–0.2)

## 2024-11-02 LAB — FERRITIN: Ferritin: 272 ng/mL (ref 11–307)

## 2024-11-02 LAB — IRON AND TIBC
Iron: 127 ug/dL (ref 28–170)
Saturation Ratios: 39 % — ABNORMAL HIGH (ref 10.4–31.8)
TIBC: 329 ug/dL (ref 250–450)
UIBC: 202 ug/dL

## 2024-11-02 LAB — POCT HEMOGLOBIN-HEMACUE: Hemoglobin: 12 g/dL (ref 12.0–15.0)

## 2024-11-02 MED ORDER — EPOETIN ALFA-EPBX 10000 UNIT/ML IJ SOLN: 10000.0000 [IU] | Freq: Once | INTRAMUSCULAR | Status: DC

## 2024-11-02 MED ORDER — EPOETIN ALFA-EPBX 10000 UNIT/ML IJ SOLN
INTRAMUSCULAR | Status: AC
  Filled 2024-11-02: qty 1

## 2024-11-04 ENCOUNTER — Ambulatory Visit: Payer: Self-pay | Admitting: Internal Medicine

## 2024-11-08 ENCOUNTER — Encounter (HOSPITAL_COMMUNITY): Payer: Self-pay | Admitting: Nephrology

## 2024-11-15 ENCOUNTER — Encounter (HOSPITAL_COMMUNITY): Payer: Self-pay | Admitting: Nephrology

## 2024-11-15 ENCOUNTER — Ambulatory Visit (HOSPITAL_COMMUNITY)
Admission: RE | Admit: 2024-11-15 | Discharge: 2024-11-15 | Disposition: A | Discharge status: Home / Self Care | Source: Ambulatory Visit | Attending: Nephrology | Admitting: Nephrology

## 2024-11-15 VITALS — BP 127/84 | HR 74 | Temp 97.7°F | Resp 15

## 2024-11-15 DIAGNOSIS — D631 Anemia in chronic kidney disease: Secondary | ICD-10-CM | POA: Diagnosis present

## 2024-11-15 DIAGNOSIS — N184 Chronic kidney disease, stage 4 (severe): Secondary | ICD-10-CM | POA: Diagnosis present

## 2024-11-15 MED ORDER — EPOETIN ALFA-EPBX 10000 UNIT/ML IJ SOLN
INTRAMUSCULAR | Status: AC
  Filled 2024-11-15: qty 1

## 2024-11-15 MED ORDER — EPOETIN ALFA-EPBX 10000 UNIT/ML IJ SOLN
10000.0000 [IU] | Freq: Once | INTRAMUSCULAR | Status: AC
  Administered 2024-11-15: 10000 [IU] via SUBCUTANEOUS

## 2024-11-16 ENCOUNTER — Encounter (HOSPITAL_COMMUNITY)

## 2024-11-16 LAB — POCT HEMOGLOBIN-HEMACUE: Hemoglobin: 11.1 g/dL — ABNORMAL LOW (ref 12.0–15.0)

## 2024-11-20 ENCOUNTER — Ambulatory Visit: Payer: Medicare HMO

## 2024-11-23 ENCOUNTER — Encounter (HOSPITAL_COMMUNITY)

## 2024-12-06 ENCOUNTER — Encounter (HOSPITAL_COMMUNITY): Payer: Self-pay

## 2024-12-13 ENCOUNTER — Inpatient Hospital Stay (HOSPITAL_COMMUNITY)
Admission: RE | Admit: 2024-12-13 | Discharge: 2024-12-13 | Disposition: A | Payer: Self-pay | Source: Ambulatory Visit | Attending: Nephrology

## 2024-12-13 VITALS — BP 142/101 | HR 75 | Temp 97.0°F | Resp 16

## 2024-12-13 DIAGNOSIS — D631 Anemia in chronic kidney disease: Secondary | ICD-10-CM

## 2024-12-13 LAB — CBC
HCT: 35.3 % — ABNORMAL LOW (ref 36.0–46.0)
Hemoglobin: 12 g/dL (ref 12.0–15.0)
MCH: 31 pg (ref 26.0–34.0)
MCHC: 34 g/dL (ref 30.0–36.0)
MCV: 91.2 fL (ref 80.0–100.0)
Platelets: 190 10*3/uL (ref 150–400)
RBC: 3.87 MIL/uL (ref 3.87–5.11)
RDW: 13.5 % (ref 11.5–15.5)
WBC: 6.4 10*3/uL (ref 4.0–10.5)
nRBC: 0 % (ref 0.0–0.2)

## 2024-12-13 LAB — IRON AND TIBC
Iron: 108 ug/dL (ref 28–170)
Saturation Ratios: 33 % — ABNORMAL HIGH (ref 10.4–31.8)
TIBC: 323 ug/dL (ref 250–450)
UIBC: 215 ug/dL

## 2024-12-13 LAB — POCT HEMOGLOBIN-HEMACUE: Hemoglobin: 11.1 g/dL — ABNORMAL LOW (ref 12.0–15.0)

## 2024-12-13 LAB — FERRITIN: Ferritin: 263 ng/mL (ref 11–307)

## 2024-12-13 MED ORDER — EPOETIN ALFA-EPBX 10000 UNIT/ML IJ SOLN
INTRAMUSCULAR | Status: AC
  Filled 2024-12-13: qty 1

## 2024-12-13 MED ORDER — EPOETIN ALFA-EPBX 10000 UNIT/ML IJ SOLN
10000.0000 [IU] | Freq: Once | INTRAMUSCULAR | Status: AC
  Administered 2024-12-13: 10000 [IU] via SUBCUTANEOUS

## 2025-01-03 ENCOUNTER — Encounter (HOSPITAL_COMMUNITY)

## 2025-01-28 ENCOUNTER — Encounter

## 2025-02-19 ENCOUNTER — Ambulatory Visit: Payer: Medicare HMO

## 2025-04-29 ENCOUNTER — Encounter

## 2025-07-29 ENCOUNTER — Encounter

## 2025-10-28 ENCOUNTER — Encounter

## 2026-01-27 ENCOUNTER — Encounter

## 2026-04-28 ENCOUNTER — Encounter
